# Patient Record
Sex: Female | Born: 1960 | Race: Black or African American | Hispanic: No | Marital: Married | Smoking: Former smoker
Health system: Southern US, Community
[De-identification: ages and names within clinical notes are randomized; demographics above are authoritative.]

## PROBLEM LIST (undated history)

## (undated) DIAGNOSIS — I10 Essential (primary) hypertension: Secondary | ICD-10-CM

## (undated) DIAGNOSIS — T7840XA Allergy, unspecified, initial encounter: Secondary | ICD-10-CM

## (undated) DIAGNOSIS — C50919 Malignant neoplasm of unspecified site of unspecified female breast: Secondary | ICD-10-CM

## (undated) DIAGNOSIS — G4730 Sleep apnea, unspecified: Secondary | ICD-10-CM

## (undated) DIAGNOSIS — M810 Age-related osteoporosis without current pathological fracture: Secondary | ICD-10-CM

## (undated) DIAGNOSIS — E785 Hyperlipidemia, unspecified: Secondary | ICD-10-CM

## (undated) DIAGNOSIS — M1990 Unspecified osteoarthritis, unspecified site: Secondary | ICD-10-CM

## (undated) DIAGNOSIS — E119 Type 2 diabetes mellitus without complications: Secondary | ICD-10-CM

## (undated) DIAGNOSIS — C50912 Malignant neoplasm of unspecified site of left female breast: Secondary | ICD-10-CM

## (undated) DIAGNOSIS — K219 Gastro-esophageal reflux disease without esophagitis: Secondary | ICD-10-CM

## (undated) HISTORY — DX: Hyperlipidemia, unspecified: E78.5

## (undated) HISTORY — PX: REDUCTION MAMMAPLASTY: SUR839

## (undated) HISTORY — PX: BREAST REDUCTION SURGERY: SHX8

## (undated) HISTORY — PX: BREAST EXCISIONAL BIOPSY: SUR124

## (undated) HISTORY — DX: Type 2 diabetes mellitus without complications: E11.9

## (undated) HISTORY — DX: Malignant neoplasm of unspecified site of unspecified female breast: C50.919

## (undated) HISTORY — DX: Essential (primary) hypertension: I10

## (undated) HISTORY — DX: Sleep apnea, unspecified: G47.30

## (undated) HISTORY — DX: Allergy, unspecified, initial encounter: T78.40XA

## (undated) HISTORY — DX: Gastro-esophageal reflux disease without esophagitis: K21.9

## (undated) HISTORY — DX: Malignant neoplasm of unspecified site of left female breast: C50.912

## (undated) HISTORY — PX: BREAST CYST ASPIRATION: SHX578

## (undated) HISTORY — DX: Age-related osteoporosis without current pathological fracture: M81.0

## (undated) HISTORY — PX: OTHER SURGICAL HISTORY: SHX169

## (undated) HISTORY — PX: ABDOMINAL HYSTERECTOMY: SHX81

## (undated) HISTORY — PX: BREAST BIOPSY: SHX20

---

## 2001-09-07 HISTORY — PX: CHOLECYSTECTOMY: SHX55

## 2004-05-06 ENCOUNTER — Ambulatory Visit: Payer: Self-pay | Admitting: Family Medicine

## 2005-05-10 ENCOUNTER — Emergency Department: Payer: Self-pay | Admitting: Emergency Medicine

## 2005-12-31 ENCOUNTER — Ambulatory Visit: Payer: Self-pay | Admitting: Family Medicine

## 2006-03-07 ENCOUNTER — Emergency Department: Payer: Self-pay | Admitting: Emergency Medicine

## 2006-03-25 ENCOUNTER — Ambulatory Visit: Payer: Self-pay

## 2007-01-11 ENCOUNTER — Ambulatory Visit: Payer: Self-pay

## 2007-02-16 ENCOUNTER — Other Ambulatory Visit: Payer: Self-pay

## 2007-02-16 ENCOUNTER — Emergency Department: Payer: Self-pay | Admitting: Emergency Medicine

## 2007-06-11 LAB — HM COLONOSCOPY

## 2007-06-23 ENCOUNTER — Emergency Department: Payer: Self-pay | Admitting: Unknown Physician Specialty

## 2007-09-15 ENCOUNTER — Ambulatory Visit: Payer: Self-pay | Admitting: Family Medicine

## 2008-07-13 ENCOUNTER — Emergency Department: Payer: Self-pay | Admitting: Emergency Medicine

## 2008-07-15 ENCOUNTER — Emergency Department: Payer: Self-pay

## 2010-03-20 ENCOUNTER — Ambulatory Visit: Payer: Self-pay | Admitting: Family Medicine

## 2010-07-17 ENCOUNTER — Ambulatory Visit: Payer: Self-pay

## 2011-04-06 ENCOUNTER — Emergency Department: Payer: Self-pay | Admitting: Emergency Medicine

## 2012-02-04 ENCOUNTER — Ambulatory Visit: Payer: Self-pay | Admitting: Family Medicine

## 2012-04-23 ENCOUNTER — Emergency Department: Payer: Self-pay | Admitting: Emergency Medicine

## 2012-04-23 LAB — COMPREHENSIVE METABOLIC PANEL
Albumin: 3.5 g/dL (ref 3.4–5.0)
Alkaline Phosphatase: 102 U/L (ref 50–136)
Anion Gap: 7 (ref 7–16)
BUN: 12 mg/dL (ref 7–18)
Bilirubin,Total: 0.3 mg/dL (ref 0.2–1.0)
Calcium, Total: 9.6 mg/dL (ref 8.5–10.1)
Chloride: 100 mmol/L (ref 98–107)
Co2: 28 mmol/L (ref 21–32)
Creatinine: 0.67 mg/dL (ref 0.60–1.30)
EGFR (African American): >60
EGFR (Non-African Amer.): >60
Glucose: 280 mg/dL — ABNORMAL HIGH (ref 65–99)
Osmolality: 280 (ref 275–301)
Potassium: 3.9 mmol/L (ref 3.5–5.1)
SGOT(AST): 22 U/L (ref 15–37)
SGPT (ALT): 65 U/L (ref 12–78)
Sodium: 135 mmol/L — ABNORMAL LOW (ref 136–145)
Total Protein: 7.7 g/dL (ref 6.4–8.2)

## 2012-04-23 LAB — CBC
HCT: 36.4 % (ref 35.0–47.0)
HGB: 12.4 g/dL (ref 12.0–16.0)
MCH: 29.7 pg (ref 26.0–34.0)
MCHC: 34.1 g/dL (ref 32.0–36.0)
MCV: 87 fL (ref 80–100)
Platelet: 232 10*3/uL (ref 150–440)
RBC: 4.19 10*6/uL (ref 3.80–5.20)
RDW: 14.2 % (ref 11.5–14.5)
WBC: 8.1 10*3/uL (ref 3.6–11.0)

## 2012-04-23 LAB — URINALYSIS, COMPLETE
Bilirubin,UR: NEGATIVE
Glucose,UR: >=500 mg/dL (ref 0–75)
Ketone: NEGATIVE
Leukocyte Esterase: NEGATIVE
Nitrite: NEGATIVE
Ph: 5 (ref 4.5–8.0)
Protein: NEGATIVE
RBC,UR: 1 /HPF (ref 0–5)
Specific Gravity: 1.013 (ref 1.003–1.030)
Squamous Epithelial: 1
WBC UR: 1 /HPF (ref 0–5)

## 2012-04-23 LAB — TROPONIN I: Troponin-I: <0.02 ng/mL

## 2012-04-23 LAB — CK TOTAL AND CKMB (NOT AT ARMC)
CK, Total: 114 U/L (ref 21–215)
CK-MB: 0.7 ng/mL (ref 0.5–3.6)

## 2012-05-21 ENCOUNTER — Ambulatory Visit: Payer: Self-pay | Admitting: Family Medicine

## 2012-06-15 ENCOUNTER — Ambulatory Visit: Payer: Self-pay | Admitting: Family Medicine

## 2012-07-10 ENCOUNTER — Ambulatory Visit: Payer: Self-pay | Admitting: Family Medicine

## 2012-08-07 ENCOUNTER — Ambulatory Visit: Payer: Self-pay | Admitting: Family Medicine

## 2012-10-26 ENCOUNTER — Ambulatory Visit: Payer: Self-pay | Admitting: Emergency Medicine

## 2012-10-28 LAB — PATHOLOGY REPORT

## 2013-01-10 ENCOUNTER — Emergency Department: Payer: Self-pay | Admitting: Emergency Medicine

## 2013-01-10 LAB — CBC
HCT: 36.9 % (ref 35.0–47.0)
HGB: 12.5 g/dL (ref 12.0–16.0)
MCH: 28.7 pg (ref 26.0–34.0)
MCHC: 33.8 g/dL (ref 32.0–36.0)
MCV: 85 fL (ref 80–100)
Platelet: 281 10*3/uL (ref 150–440)
RBC: 4.34 10*6/uL (ref 3.80–5.20)
RDW: 14.2 % (ref 11.5–14.5)
WBC: 8.8 10*3/uL (ref 3.6–11.0)

## 2013-01-10 LAB — COMPREHENSIVE METABOLIC PANEL
Albumin: 3.7 g/dL (ref 3.4–5.0)
Alkaline Phosphatase: 85 U/L (ref 50–136)
Anion Gap: 4 — ABNORMAL LOW (ref 7–16)
BUN: 16 mg/dL (ref 7–18)
Bilirubin,Total: 0.3 mg/dL (ref 0.2–1.0)
Calcium, Total: 9.3 mg/dL (ref 8.5–10.1)
Chloride: 103 mmol/L (ref 98–107)
Co2: 31 mmol/L (ref 21–32)
Creatinine: 0.62 mg/dL (ref 0.60–1.30)
EGFR (African American): >60
EGFR (Non-African Amer.): >60
Glucose: 114 mg/dL — ABNORMAL HIGH (ref 65–99)
Osmolality: 278 (ref 275–301)
Potassium: 3.5 mmol/L (ref 3.5–5.1)
SGOT(AST): 19 U/L (ref 15–37)
SGPT (ALT): 48 U/L (ref 12–78)
Sodium: 138 mmol/L (ref 136–145)
Total Protein: 7.6 g/dL (ref 6.4–8.2)

## 2013-01-10 LAB — URINALYSIS, COMPLETE
Bacteria: NONE SEEN
Bilirubin,UR: NEGATIVE
Blood: NEGATIVE
Glucose,UR: NEGATIVE mg/dL (ref 0–75)
Ketone: NEGATIVE
Leukocyte Esterase: NEGATIVE
Nitrite: NEGATIVE
Ph: 7 (ref 4.5–8.0)
Protein: NEGATIVE
RBC,UR: NONE SEEN /HPF (ref 0–5)
Specific Gravity: 1.008 (ref 1.003–1.030)
Squamous Epithelial: <1
WBC UR: NONE SEEN /HPF (ref 0–5)

## 2013-01-10 LAB — TROPONIN I: Troponin-I: <0.02 ng/mL

## 2013-03-17 ENCOUNTER — Ambulatory Visit: Payer: Self-pay | Admitting: Family Medicine

## 2013-03-30 ENCOUNTER — Emergency Department: Payer: Self-pay | Admitting: Emergency Medicine

## 2013-03-30 LAB — CBC
HCT: 39.7 % (ref 35.0–47.0)
HGB: 13.4 g/dL (ref 12.0–16.0)
MCH: 28.8 pg (ref 26.0–34.0)
MCHC: 33.7 g/dL (ref 32.0–36.0)
MCV: 85 fL (ref 80–100)
Platelet: 336 10*3/uL (ref 150–440)
RBC: 4.64 10*6/uL (ref 3.80–5.20)
RDW: 14.3 % (ref 11.5–14.5)
WBC: 10.2 10*3/uL (ref 3.6–11.0)

## 2013-03-30 LAB — URINALYSIS, COMPLETE
Bacteria: NONE SEEN
Bilirubin,UR: NEGATIVE
Blood: NEGATIVE
Glucose,UR: >=500 mg/dL (ref 0–75)
Ketone: NEGATIVE
Leukocyte Esterase: NEGATIVE
Nitrite: NEGATIVE
Ph: 8 (ref 4.5–8.0)
Protein: 100
RBC,UR: <1 /HPF (ref 0–5)
Specific Gravity: 1.008 (ref 1.003–1.030)
Squamous Epithelial: 1
WBC UR: NONE SEEN /HPF (ref 0–5)

## 2013-03-30 LAB — COMPREHENSIVE METABOLIC PANEL
Albumin: 3.8 g/dL (ref 3.4–5.0)
Alkaline Phosphatase: 105 U/L (ref 50–136)
Anion Gap: 8 (ref 7–16)
BUN: 13 mg/dL (ref 7–18)
Bilirubin,Total: 0.3 mg/dL (ref 0.2–1.0)
Calcium, Total: 10.1 mg/dL (ref 8.5–10.1)
Chloride: 101 mmol/L (ref 98–107)
Co2: 26 mmol/L (ref 21–32)
Creatinine: 0.88 mg/dL (ref 0.60–1.30)
EGFR (African American): >60
EGFR (Non-African Amer.): >60
Glucose: 240 mg/dL — ABNORMAL HIGH (ref 65–99)
Osmolality: 278 (ref 275–301)
Potassium: 3.9 mmol/L (ref 3.5–5.1)
SGOT(AST): 16 U/L (ref 15–37)
SGPT (ALT): 58 U/L (ref 12–78)
Sodium: 135 mmol/L — ABNORMAL LOW (ref 136–145)
Total Protein: 8.2 g/dL (ref 6.4–8.2)

## 2013-04-08 ENCOUNTER — Ambulatory Visit: Payer: Self-pay | Admitting: Family Medicine

## 2013-04-18 ENCOUNTER — Ambulatory Visit: Payer: Self-pay | Admitting: Family Medicine

## 2013-04-19 LAB — PATHOLOGY REPORT

## 2013-05-02 ENCOUNTER — Ambulatory Visit: Payer: Self-pay | Admitting: Family Medicine

## 2013-05-10 ENCOUNTER — Ambulatory Visit: Payer: Self-pay | Admitting: Hematology and Oncology

## 2013-05-11 ENCOUNTER — Ambulatory Visit: Payer: Self-pay | Admitting: Hematology and Oncology

## 2013-05-11 LAB — PATHOLOGY REPORT

## 2013-05-19 ENCOUNTER — Ambulatory Visit: Payer: Self-pay | Admitting: Orthopedic Surgery

## 2013-06-09 ENCOUNTER — Ambulatory Visit: Payer: Self-pay | Admitting: Hematology and Oncology

## 2013-06-13 ENCOUNTER — Ambulatory Visit: Payer: Self-pay | Admitting: Surgery

## 2013-06-13 LAB — BASIC METABOLIC PANEL
Anion Gap: 5 — ABNORMAL LOW (ref 7–16)
BUN: 17 mg/dL (ref 7–18)
Calcium, Total: 9.4 mg/dL (ref 8.5–10.1)
Chloride: 100 mmol/L (ref 98–107)
Co2: 30 mmol/L (ref 21–32)
Creatinine: 0.56 mg/dL — ABNORMAL LOW (ref 0.60–1.30)
EGFR (African American): >60
EGFR (Non-African Amer.): >60
Glucose: 152 mg/dL — ABNORMAL HIGH (ref 65–99)
Osmolality: 275 (ref 275–301)
Potassium: 3.7 mmol/L (ref 3.5–5.1)
Sodium: 135 mmol/L — ABNORMAL LOW (ref 136–145)

## 2013-06-17 ENCOUNTER — Ambulatory Visit: Payer: Self-pay | Admitting: Surgery

## 2013-06-17 HISTORY — PX: BREAST LUMPECTOMY: SHX2

## 2013-06-21 LAB — PATHOLOGY REPORT

## 2013-07-10 ENCOUNTER — Ambulatory Visit: Payer: Self-pay | Admitting: Hematology and Oncology

## 2013-08-01 LAB — CBC CANCER CENTER
Basophil #: 0.1 x10 3/mm (ref 0.0–0.1)
Basophil %: 0.9 %
Eosinophil #: 0 x10 3/mm (ref 0.0–0.7)
Eosinophil %: 0.5 %
HCT: 36.5 % (ref 35.0–47.0)
HGB: 11.8 g/dL — ABNORMAL LOW (ref 12.0–16.0)
Lymphocyte #: 2.5 x10 3/mm (ref 1.0–3.6)
Lymphocyte %: 28.6 %
MCH: 27.8 pg (ref 26.0–34.0)
MCHC: 32.4 g/dL (ref 32.0–36.0)
MCV: 86 fL (ref 80–100)
Monocyte #: 0.6 x10 3/mm (ref 0.2–0.9)
Monocyte %: 6.6 %
Neutrophil #: 5.6 x10 3/mm (ref 1.4–6.5)
Neutrophil %: 63.4 %
Platelet: 334 x10 3/mm (ref 150–440)
RBC: 4.25 10*6/uL (ref 3.80–5.20)
RDW: 13.9 % (ref 11.5–14.5)
WBC: 8.8 x10 3/mm (ref 3.6–11.0)

## 2013-08-07 ENCOUNTER — Ambulatory Visit: Payer: Self-pay | Admitting: Hematology and Oncology

## 2013-08-08 LAB — CBC CANCER CENTER
Basophil #: 0.1 x10 3/mm (ref 0.0–0.1)
Basophil %: 1.1 %
Eosinophil #: 0.1 x10 3/mm (ref 0.0–0.7)
Eosinophil %: 0.9 %
HCT: 37.7 % (ref 35.0–47.0)
HGB: 11.9 g/dL — ABNORMAL LOW (ref 12.0–16.0)
Lymphocyte #: 2 x10 3/mm (ref 1.0–3.6)
Lymphocyte %: 24.1 %
MCH: 27.5 pg (ref 26.0–34.0)
MCHC: 31.7 g/dL — ABNORMAL LOW (ref 32.0–36.0)
MCV: 87 fL (ref 80–100)
Monocyte #: 0.5 x10 3/mm (ref 0.2–0.9)
Monocyte %: 6.4 %
Neutrophil #: 5.7 x10 3/mm (ref 1.4–6.5)
Neutrophil %: 67.5 %
Platelet: 323 x10 3/mm (ref 150–440)
RBC: 4.34 10*6/uL (ref 3.80–5.20)
RDW: 14 % (ref 11.5–14.5)
WBC: 8.4 x10 3/mm (ref 3.6–11.0)

## 2013-08-11 LAB — COMPREHENSIVE METABOLIC PANEL
Albumin: 3.6 g/dL (ref 3.4–5.0)
Alkaline Phosphatase: 81 U/L
Anion Gap: 8 (ref 7–16)
BUN: 14 mg/dL (ref 7–18)
Bilirubin,Total: 0.3 mg/dL (ref 0.2–1.0)
Calcium, Total: 8.9 mg/dL (ref 8.5–10.1)
Chloride: 100 mmol/L (ref 98–107)
Co2: 30 mmol/L (ref 21–32)
Creatinine: 0.72 mg/dL (ref 0.60–1.30)
EGFR (African American): >60
EGFR (Non-African Amer.): >60
Glucose: 145 mg/dL — ABNORMAL HIGH (ref 65–99)
Osmolality: 279 (ref 275–301)
Potassium: 3.7 mmol/L (ref 3.5–5.1)
SGOT(AST): 9 U/L — ABNORMAL LOW (ref 15–37)
SGPT (ALT): 35 U/L (ref 12–78)
Sodium: 138 mmol/L (ref 136–145)
Total Protein: 7.7 g/dL (ref 6.4–8.2)

## 2013-08-11 LAB — CBC CANCER CENTER
Basophil #: 0.1 x10 3/mm (ref 0.0–0.1)
Basophil %: 0.7 %
Eosinophil #: 0 x10 3/mm (ref 0.0–0.7)
Eosinophil %: 0.6 %
HCT: 38.1 % (ref 35.0–47.0)
HGB: 12.3 g/dL (ref 12.0–16.0)
Lymphocyte #: 1.5 x10 3/mm (ref 1.0–3.6)
Lymphocyte %: 21.2 %
MCH: 27.8 pg (ref 26.0–34.0)
MCHC: 32.3 g/dL (ref 32.0–36.0)
MCV: 86 fL (ref 80–100)
Monocyte #: 0.4 x10 3/mm (ref 0.2–0.9)
Monocyte %: 5.8 %
Neutrophil #: 5 x10 3/mm (ref 1.4–6.5)
Neutrophil %: 71.7 %
Platelet: 296 x10 3/mm (ref 150–440)
RBC: 4.42 10*6/uL (ref 3.80–5.20)
RDW: 14.1 % (ref 11.5–14.5)
WBC: 7 x10 3/mm (ref 3.6–11.0)

## 2013-08-15 LAB — CBC CANCER CENTER
Basophil #: 0.1 x10 3/mm (ref 0.0–0.1)
Basophil %: 1.1 %
Eosinophil #: 0.1 x10 3/mm (ref 0.0–0.7)
Eosinophil %: 0.8 %
HCT: 37.8 % (ref 35.0–47.0)
HGB: 12.2 g/dL (ref 12.0–16.0)
Lymphocyte #: 2.3 x10 3/mm (ref 1.0–3.6)
Lymphocyte %: 25.6 %
MCH: 27.7 pg (ref 26.0–34.0)
MCHC: 32.2 g/dL (ref 32.0–36.0)
MCV: 86 fL (ref 80–100)
Monocyte #: 0.7 x10 3/mm (ref 0.2–0.9)
Monocyte %: 7.3 %
Neutrophil #: 5.9 x10 3/mm (ref 1.4–6.5)
Neutrophil %: 65.2 %
Platelet: 305 x10 3/mm (ref 150–440)
RBC: 4.4 10*6/uL (ref 3.80–5.20)
RDW: 14.1 % (ref 11.5–14.5)
WBC: 9.1 x10 3/mm (ref 3.6–11.0)

## 2013-08-22 LAB — CBC CANCER CENTER
Basophil #: 0.1 x10 3/mm (ref 0.0–0.1)
Basophil %: 0.8 %
Eosinophil #: 0.1 x10 3/mm (ref 0.0–0.7)
Eosinophil %: 1.1 %
HCT: 37.5 % (ref 35.0–47.0)
HGB: 12.2 g/dL (ref 12.0–16.0)
Lymphocyte #: 1.8 x10 3/mm (ref 1.0–3.6)
Lymphocyte %: 24.4 %
MCH: 28.1 pg (ref 26.0–34.0)
MCHC: 32.5 g/dL (ref 32.0–36.0)
MCV: 87 fL (ref 80–100)
Monocyte #: 0.5 x10 3/mm (ref 0.2–0.9)
Monocyte %: 7.1 %
Neutrophil #: 5.1 x10 3/mm (ref 1.4–6.5)
Neutrophil %: 66.6 %
Platelet: 261 x10 3/mm (ref 150–440)
RBC: 4.32 10*6/uL (ref 3.80–5.20)
RDW: 14.1 % (ref 11.5–14.5)
WBC: 7.6 x10 3/mm (ref 3.6–11.0)

## 2013-08-29 LAB — CBC CANCER CENTER
Basophil #: 0 x10 3/mm (ref 0.0–0.1)
Basophil %: 0.6 %
Eosinophil #: 0.1 x10 3/mm (ref 0.0–0.7)
Eosinophil %: 0.9 %
HCT: 35.1 % (ref 35.0–47.0)
HGB: 11.2 g/dL — ABNORMAL LOW (ref 12.0–16.0)
Lymphocyte #: 1.5 x10 3/mm (ref 1.0–3.6)
Lymphocyte %: 20.7 %
MCH: 27.8 pg (ref 26.0–34.0)
MCHC: 32 g/dL (ref 32.0–36.0)
MCV: 87 fL (ref 80–100)
Monocyte #: 0.4 x10 3/mm (ref 0.2–0.9)
Monocyte %: 6.4 %
Neutrophil #: 5 x10 3/mm (ref 1.4–6.5)
Neutrophil %: 71.4 %
Platelet: 274 x10 3/mm (ref 150–440)
RBC: 4.04 10*6/uL (ref 3.80–5.20)
RDW: 14.4 % (ref 11.5–14.5)
WBC: 7 x10 3/mm (ref 3.6–11.0)

## 2013-09-05 LAB — CBC CANCER CENTER
Basophil #: 0.1 x10 3/mm (ref 0.0–0.1)
Basophil %: 0.7 %
Eosinophil #: 0.1 x10 3/mm (ref 0.0–0.7)
Eosinophil %: 1.1 %
HCT: 37.5 % (ref 35.0–47.0)
HGB: 12 g/dL (ref 12.0–16.0)
Lymphocyte #: 1.5 x10 3/mm (ref 1.0–3.6)
Lymphocyte %: 19.9 %
MCH: 27.7 pg (ref 26.0–34.0)
MCHC: 32 g/dL (ref 32.0–36.0)
MCV: 87 fL (ref 80–100)
Monocyte #: 0.5 x10 3/mm (ref 0.2–0.9)
Monocyte %: 6.8 %
Neutrophil #: 5.5 x10 3/mm (ref 1.4–6.5)
Neutrophil %: 71.5 %
Platelet: 277 x10 3/mm (ref 150–440)
RBC: 4.33 10*6/uL (ref 3.80–5.20)
RDW: 14.2 % (ref 11.5–14.5)
WBC: 7.7 x10 3/mm (ref 3.6–11.0)

## 2013-09-07 ENCOUNTER — Ambulatory Visit: Payer: Self-pay | Admitting: Hematology and Oncology

## 2013-09-27 LAB — COMPREHENSIVE METABOLIC PANEL WITH GFR
Albumin: 3.4 g/dL
Alkaline Phosphatase: 72 U/L
Anion Gap: 9
BUN: 18 mg/dL
Bilirubin,Total: 0.3 mg/dL
Calcium, Total: 9.6 mg/dL
Chloride: 104 mmol/L
Co2: 30 mmol/L
Creatinine: 0.77 mg/dL
EGFR (African American): >60
EGFR (Non-African Amer.): >60
Glucose: 131 mg/dL — ABNORMAL HIGH
Osmolality: 289
Potassium: 3.6 mmol/L
SGOT(AST): 6 U/L — ABNORMAL LOW
SGPT (ALT): 42 U/L
Sodium: 143 mmol/L
Total Protein: 7.4 g/dL

## 2013-09-27 LAB — CBC CANCER CENTER
Basophil #: 0.1 10*3/uL
Basophil %: 0.9 %
Eosinophil #: 0 10*3/uL
Eosinophil %: 0.9 %
HCT: 35 %
HGB: 11.4 g/dL — ABNORMAL LOW
Lymphocyte %: 21.5 %
Lymphs Abs: 1.2 10*3/uL
MCH: 28 pg
MCHC: 32.5 g/dL
MCV: 86 fL
Monocyte #: 0.4 10*3/uL
Monocyte %: 7.7 %
Neutrophil #: 3.9 10*3/uL
Neutrophil %: 69 %
Platelet: 277 10*3/uL
RBC: 4.07 10*6/uL
RDW: 15.1 % — ABNORMAL HIGH
WBC: 5.6 10*3/uL

## 2013-10-07 ENCOUNTER — Ambulatory Visit: Payer: Self-pay | Admitting: Hematology and Oncology

## 2013-11-02 LAB — COMPREHENSIVE METABOLIC PANEL
Albumin: 3.4 g/dL (ref 3.4–5.0)
Alkaline Phosphatase: 68 U/L
Anion Gap: 7 (ref 7–16)
BUN: 15 mg/dL (ref 7–18)
Bilirubin,Total: 0.4 mg/dL (ref 0.2–1.0)
Calcium, Total: 9.5 mg/dL (ref 8.5–10.1)
Chloride: 102 mmol/L (ref 98–107)
Co2: 28 mmol/L (ref 21–32)
Creatinine: 0.71 mg/dL (ref 0.60–1.30)
EGFR (African American): >60
EGFR (Non-African Amer.): >60
Glucose: 169 mg/dL — ABNORMAL HIGH (ref 65–99)
Osmolality: 279 (ref 275–301)
Potassium: 3.9 mmol/L (ref 3.5–5.1)
SGOT(AST): 15 U/L (ref 15–37)
SGPT (ALT): 50 U/L (ref 12–78)
Sodium: 137 mmol/L (ref 136–145)
Total Protein: 7.4 g/dL (ref 6.4–8.2)

## 2013-11-02 LAB — CBC CANCER CENTER
Basophil #: 0.1 x10 3/mm (ref 0.0–0.1)
Basophil %: 1 %
Eosinophil #: 0 x10 3/mm (ref 0.0–0.7)
Eosinophil %: 0.5 %
HCT: 37.3 % (ref 35.0–47.0)
HGB: 12.4 g/dL (ref 12.0–16.0)
Lymphocyte #: 1.2 x10 3/mm (ref 1.0–3.6)
Lymphocyte %: 21.4 %
MCH: 28.9 pg (ref 26.0–34.0)
MCHC: 33.2 g/dL (ref 32.0–36.0)
MCV: 87 fL (ref 80–100)
Monocyte #: 0.5 x10 3/mm (ref 0.2–0.9)
Monocyte %: 8.1 %
Neutrophil #: 4 x10 3/mm (ref 1.4–6.5)
Neutrophil %: 69 %
Platelet: 251 x10 3/mm (ref 150–440)
RBC: 4.28 10*6/uL (ref 3.80–5.20)
RDW: 14.7 % — ABNORMAL HIGH (ref 11.5–14.5)
WBC: 5.8 x10 3/mm (ref 3.6–11.0)

## 2013-11-07 ENCOUNTER — Ambulatory Visit: Payer: Self-pay | Admitting: Hematology and Oncology

## 2013-11-14 DIAGNOSIS — M1711 Unilateral primary osteoarthritis, right knee: Secondary | ICD-10-CM | POA: Insufficient documentation

## 2014-03-09 ENCOUNTER — Ambulatory Visit: Payer: Self-pay | Admitting: Internal Medicine

## 2014-04-13 ENCOUNTER — Ambulatory Visit: Payer: Self-pay | Admitting: Hematology and Oncology

## 2014-04-14 LAB — CBC CANCER CENTER
Basophil #: 0.1 x10 3/mm (ref 0.0–0.1)
Basophil %: 1 %
Eosinophil #: 0.1 x10 3/mm (ref 0.0–0.7)
Eosinophil %: 0.5 %
HCT: 38.7 % (ref 35.0–47.0)
HGB: 12.5 g/dL (ref 12.0–16.0)
Lymphocyte #: 2.6 x10 3/mm (ref 1.0–3.6)
Lymphocyte %: 20.6 %
MCH: 28.9 pg (ref 26.0–34.0)
MCHC: 32.3 g/dL (ref 32.0–36.0)
MCV: 90 fL (ref 80–100)
Monocyte #: 0.7 x10 3/mm (ref 0.2–0.9)
Monocyte %: 5.7 %
Neutrophil #: 9.1 x10 3/mm — ABNORMAL HIGH (ref 1.4–6.5)
Neutrophil %: 72.2 %
RBC: 4.31 10*6/uL (ref 3.80–5.20)
RDW: 13.9 % (ref 11.5–14.5)
WBC: 12.6 x10 3/mm — ABNORMAL HIGH (ref 3.6–11.0)

## 2014-04-14 LAB — BASIC METABOLIC PANEL
Anion Gap: 9 (ref 7–16)
BUN: 17 mg/dL (ref 7–18)
Calcium, Total: 9.1 mg/dL (ref 8.5–10.1)
Chloride: 103 mmol/L (ref 98–107)
Co2: 29 mmol/L (ref 21–32)
Creatinine: 0.75 mg/dL (ref 0.60–1.30)
EGFR (African American): >60
EGFR (Non-African Amer.): >60
Glucose: 124 mg/dL — ABNORMAL HIGH (ref 65–99)
Osmolality: 284 (ref 275–301)
Potassium: 4 mmol/L (ref 3.5–5.1)
Sodium: 141 mmol/L (ref 136–145)

## 2014-05-09 ENCOUNTER — Ambulatory Visit: Payer: Self-pay | Admitting: Hematology and Oncology

## 2014-07-06 ENCOUNTER — Ambulatory Visit: Payer: Self-pay | Admitting: Hematology and Oncology

## 2014-07-06 DIAGNOSIS — Z853 Personal history of malignant neoplasm of breast: Secondary | ICD-10-CM | POA: Diagnosis not present

## 2014-08-02 LAB — HM PAP SMEAR: HM Pap smear: NEGATIVE

## 2014-08-03 LAB — HEPATIC FUNCTION PANEL
ALT: 20 U/L (ref 7–35)
AST: 12 U/L — AB (ref 13–35)

## 2014-08-03 LAB — LIPID PANEL
Cholesterol: 146 mg/dL (ref 0–200)
HDL: 25 mg/dL — AB (ref 35–70)
LDL Cholesterol: 83 mg/dL
Triglycerides: 189 mg/dL — AB (ref 40–160)

## 2014-08-03 LAB — BASIC METABOLIC PANEL
BUN: 15 mg/dL (ref 4–21)
Creatinine: 0.6 mg/dL (ref 0.5–1.1)
Glucose: 147 mg/dL
Potassium: 4.2 mmol/L (ref 3.4–5.3)
Sodium: 145 mmol/L (ref 137–147)

## 2014-08-03 LAB — CBC AND DIFFERENTIAL
HCT: 36 % (ref 36–46)
Hemoglobin: 12 g/dL (ref 12.0–16.0)
Platelets: 269 10*3/uL (ref 150–399)
WBC: 5.1 10^3/mL

## 2014-09-20 LAB — CREATININE, SERUM: Creatine, Serum: 0.75

## 2014-09-30 NOTE — Op Note (Signed)
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   PROCEDURE:  Left partial mastectomy.   SURGEON:  Loreli Dollar, M.D.   ANESTHESIA:  General.   INDICATIONS:  This 54 year old female has a history of mammogram findings of all of multiple micro-calcifications within the left breast. She had needle biopsies in the lower outer quadrant. One biopsy demonstrated lobular carcinoma in situ, and another biopsy demonstrated ductal carcinoma in situ. Radiographic localization was recommended for these 2 lesions with partial mastectomy for further evaluation and treatment. The patient has also had Oncology consultation.   She did have preoperative insertion of Kopans wires in the lower outer quadrant. I reviewed these mammogram images demonstrating the location of the wires and the depth.   The patient was placed on the operating table in the supine position under general anesthesia. The dressing was removed from the left breast exposing the Kopans wire in the lower outer quadrant. The wires were cut 2 cm from the skin. The breast was prepared with ChloraPrep and draped in a sterile manner. It is noted that the site of entrance of the wires was just above her old scar where she had had reduction mammoplasty. An obliquely-oriented curvilinear incision was made from approximately 3:30 to 5:30 position in the peripheral aspect of the left breast. This elliptical incision encompassed the entrance point of the wires. Dissection was carried down into the breast circumferentially around the wires and completely excised the tissue with the wires. I did not identify any actual tumor, although I did identify some glandular tissue of the breast and what may be  some old scarring. The specimen was labeled so that the 5:30 position of the skin ellipse was tagged with a stitch and also margin maps were attached to mark the medial, lateral, cranial, caudal and deep margins. This was submitted for specimen mammogram and pathology.   The wound was inspected. It is noted that there was a point in the inferior aspect of the wound which bled and was cauterized and then was suture ligated with 4-0 chromic. Multiple other small bleeding points were cauterized during the course of the procedure. Deeper tissues surrounding cautery artifact were infiltrated with 0.5% Sensorcaine with epinephrine. The subcutaneous tissues were infiltrated as well using a total of 19 mL. The subcutaneous tissues were closed with 4-0 chromic, and the skin was closed with running 4-0 Monocryl subcuticular suture.   I waited and did see her mammogram depicting the location of the biopsy markers and micro-calcifications and wires. Later the pathologist called back to indicate that this had been evaluated and it appeared that the inferior margin was somewhat close and there was concern about the inferior margin and possibility of tumor in this area.   Subsequently, the wound was reopened and I re-excised the inferior margin by grasping this tissue with 2 Allis clamps, with the Allis clamps were approximately 3 cm apart, and beginning anteriorly and continuing in a posterior direction, a portion of tissue approximately 1 cm thick and some 4 x 4 cm was excised as the new posterior margin. This was placed onto a Telfa. I used a suture to attach it to the Telfa and also reported on the Pathology request that the  new margin was adjacent to the Telfa and also noted that the suture ligature was within the old margin and this was submitted for pathology.   It is noted that during the course of this re-excision, one bleeding point was suture ligated with 4-0 chromic. It is further noted the remainder of the  wound was further inspected and hemostasis appeared to be intact. Next, the wound was closed with interrupted 4-0 chromic subcutaneous sutures and then the skin was closed with running 4-0 Monocryl subcuticular suture and Dermabond.   The patient appeared to tolerate the procedure well and was then prepared for transfer to the Recovery Room.   It required somewhat more effort due to the need for re-opening the wound and a re-excising the inferior margin and also included 2 markers.   ____________________________ Lenna Sciara. Rochel Brome, MD [email or web address] D: 06/17/2013 14:46:56 ET T: 06/17/2013 15:43:22 ET JOB#: [id number]  cc: Loreli Dollar, MD, <Dictator> Loreli Dollar MD ELECTRONICALLY SIGNED 06/22/2013 18:08

## 2014-09-30 NOTE — Consult Note (Signed)
Reason for Visit: This 54 year old Female patient presents to the clinic for initial evaluation of  breast cancer .   Referred by Dr. Tamala Julian.  Diagnosis:  Chief Complaint/Diagnosis   54 year old female status post wide local excision of the left breast for a stage 0 (Tis N0 M0) ductal carcinoma in situ with lobular carcinoma in situ also present on reexcision margins clear tumor ER/PR positive patient is status post bilateral breast reduction  Pathology Report pathology report reviewed   Imaging Report mammograms reviewed   Referral Report clinical notes reviewed   Planned Treatment Regimen whole breast radiation plus tamoxifen   HPI   patient is a 54 year old female who presented in October of 2014 with suspicious fine linear oriented microcalcifications in the upper outer quadrant of the left breast spanning approximate 7 cm. Stereotactic biopsy was recommended and was performed November 10 showing lobular carcinoma in situ.she wants have a second biopsy on June 17, 2013 showing low grade ductal carcinoma in situ again ER/PR positive. Reexcision showed negative margins. Patient has done well postoperatively. 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 She seen today for initial consultation. She is doing well she specifically denies breast tenderness cough or bone pain.  Past Hx:    Breast Cancer DCIS:    Asthma:    GERD - Esophageal Reflux:    Hypercholesterolemia:    Arthritis:    Diabetes Mellitus, Type II (NIDD):    HTN:    COPD:    Diabetes:    gerd:    htn:    breast reduction:    c section x 2:    cholecystectomy:    hysterectomy:   Past, Family and Social History:  Past Medical History positive   Cardiovascular hyperlipidemia; hypertension   Respiratory asthma; COPD   Gastrointestinal GERD   Endocrine diabetes mellitus   Past Surgical  History cholecystectomy; bilateral breast reduction, C-section, hysterectomy   Past Medical History Comments arthritis   Family History positive   Family History Comments mother with both breast and rectal cancer uncle with gastric cancer   Social History noncontributory   Additional Past Medical and Surgical History accompanied by her husband today   Allergies:   Allegra: GI Distress  Bactrim: Unknown  Sulfa drugs: Unknown  Avelox: Hallucinations  Penicillin: GI Distress  Home Meds:  Home Medications: Medication Instructions Status  cetirizine 10 mg oral tablet 1 tab(s) orally once a day Active  amlodipine 5 mg oral tablet 1 tab(s) orally once a day Active  Vitamin D3 5000 intl units oral capsule 1 cap(s) orally once a day Active  metFORMIN 1000 mg oral tablet 1 tab(s) orally 2 times a day Active  pravastatin 40 mg oral tablet 1 tab(s) orally once a day Active  omeprazole 20 mg oral delayed release tablet 1 tab(s) orally once a day Active  ProAir HFA CFC free 90 mcg/inh inhalation aerosol 2 puff(s) inhaled 4 times a day, As Needed - for Shortness of Breath Active  meloxicam 15 mg oral tablet 1 tab(s) orally once a day Active  Januvia 100 mg oral tablet 1 tab(s) orally once a day Active  hydrochlorothiazide-lisinopril 12.5/25mg  2 tab(s) orally once a day Active  spironolactone 20 milligram(s) orally once a day Active  Vitamin C 1000 mg oral tablet 1 tab(s) orally once a day Active  Vitamin B-12 500 mcg oral tablet 1  tab(s) orally once a day Active  Hair, Skin & Nails 5 mg oral capsule 1 cap(s) orally once a day Active  Fish Oil 1200 mg oral capsule 1 cap(s) orally once a day Active  coconut oil 5 milligram(s) 2 tabs  once a day Active  montelukast 10 mg oral tablet 1 tab(s) orally once a day Active  Norco 325 mg-5 mg oral tablet  1-2 orally every 4 hours as needed for pain Active  levETIRAcetam 500 mg oral tablet 1 tab(s) orally 2 times a day Active   Review of Systems:   General negative   Performance Status (ECOG) 0   Skin negative   Breast see HPI   Ophthalmologic negative   ENMT negative   Respiratory and Thorax negative   Cardiovascular negative   Gastrointestinal negative   Genitourinary negative   Musculoskeletal negative   Neurological negative   Psychiatric negative   Hematology/Lymphatics negative   Endocrine negative   Allergic/Immunologic negative   Review of Systems   review of systems obtained from nurses notes  Nursing Notes:  Nursing Vital Signs and Chemo Nursing Nursing Notes: *CC Vital Signs Flowsheet:   26-Jan-15 10:29  Temp Temperature 97.6  Pulse Pulse 73  Respirations Respirations 21  SBP SBP 160  DBP DBP 78  Current Weight (kg) (kg) 104.3  Height (cm) centimeters 151  BSA (m2) 1.9   Physical Exam:  General/Skin/HEENT:  General normal   Skin normal   Eyes normal   ENMT normal   Head and Neck normal   Additional PE well-developed obese female in NAD. Lungs are clear to A&P cardiac examination shows regular rate and rhythm. She status post bilateral breast reduction. There's been reconstruction the nipple areolar complex. Status post wide local excision of the left breast with incision healing well. No dominant mass or nodularity is noted in either breast into position examined. No axillary or supraclavicular adenopathy is appreciated.   Breasts/Resp/CV/GI/GU:  Respiratory and Thorax normal   Cardiovascular normal   Gastrointestinal normal   Genitourinary normal   MS/Neuro/Psych/Lymph:  Musculoskeletal normal   Neurological normal   Lymphatics normal   Other Results:  Radiology Results: [place]:    31-Oct-14 13:20, Digital Additional Views Bilateral  Digital Additional Views Bilateral   REASON FOR EXAM:    av lt mass w calcs and rt calci  COMMENTS:       PROCEDURE: MAM - MAM DGTL  ADD VW  BIL SCR  - Apr 08 2013  1:20PM     CLINICAL DATA:  Calcifications in both breasts  and possible mass  left breast identified on recent screening mammogram. The patient  has a history of bilateral breast reduction.    EXAM:  DIGITAL DIAGNOSTIC  BILATERAL MAMMOGRAM    ULTRASOUND LEFT BREAST    COMPARISON:  03/17/2013  ACR Breast Density Category c: The breast tissue is heterogeneously  dense, which may obscure small masses.    FINDINGS:  Magnification views of the posterior 3rd of the central inferior  right breast demonstrate coarse calcifications along an area of  postsurgical scarring. These calcifications have similar appearances  to the patient's multiple scattered bilateral calcifications in both  breasts inner likely related to fat necrosis secondary to breast  reduction.    Magnification views of the upper outer left breast show multiple  fine, heterogeneous microcalcifications, many of which are linearly  arranged, suspicious for a ductal distribution. These calcifications  span at least 7 x 7 x 3.5 cm. There  is some patient motion artifact  in the mL projection. Ductal carcinoma in situ cannot be excluded.    Focal spot compression view of the outer left breast demonstrates a  persistent oval nodular density.    On physical exam no mass is palpated in the outer left breast.    Ultrasound is performed, showing a mildly complex cyst at 2:30  position left breast 4 cm from nipple measuring 6 x 5 x 4 mm. No  suspicious mass is seen on ultrasound.     IMPRESSION:  Suspicious fine, linearly oriented microcalcifications in the upper  outer left breast. These calcifications span approximately 7 cm  greatest diameter and are suspicious for ductal carcinoma in situ.  No evidence of malignancy in the right breast.    RECOMMENDATION:    Stereotactic biopsy of left breast calcifications is recommended and  has been scheduled for 04/18/2013 at 9 o'clock a.m..    I have discussed the findings and recommendations with the patient.  Results were also provided  in writing at the conclusion of the  visit. If applicable, a reminder letter will be sent to the patient  regarding the next appointment.    BI-RADS CATEGORY  4: Suspicious abnormality - biopsy should be  considered.  Electronically Signed    By: Curlene Dolphin M.D.    On: 04/08/2013 15:02         Verified By: Sheppard Evens, M.D.,   Relevent Results:   Relevant Scans and Labs mammograms reviewed   Assessment and Plan: Impression:   54 year old female withductal carcinoma in situ of the left breast status post wide local excision ER/PR positive to receive whole breast radiation plus tamoxifen. Plan:   at this time I have a long discussion with the patient and her husband about my recommendations a standard of care and ductal carcinoma in situ. Patient's reluctance has been discussed and she is willing to undergo whole breast radiation. Would plan on delivering 5000 cGy to her left breast. Based on her large breast size she's not a candidate for hyperfractionated regimens. Would also boost or scar another 1400 cGy using electron beam. Risks and benefits of treatment including skin reaction, fatigue, possible inclusion of superficial lung, and possible alteration the blood counts were discussed in detail with the patient. She seems to comprehend my treatment plan well. I have set her for CTsimulation early next week. Patient will also be a candidate for tamoxifen therapy after completion of radiation.  I would like to take this opportunity to thank you for allowing me to continue to participate in this patient's care.  CC Referral:  cc: Dr. Tamala Julian, Dr. Margarita Rana   Electronic Signatures: Baruch Gouty Roda Shutters (MD)  (Signed 26-Jan-15 11:27)  Authored: HPI, Diagnosis, Past Hx, PFSH, Allergies, Home Meds, ROS, Nursing Notes, Physical Exam, Other Results, Relevent Results, Encounter Assessment and Plan, CC Referring Physician   Last Updated: 26-Jan-15 11:27 by Armstead Peaks (MD)

## 2014-10-04 ENCOUNTER — Other Ambulatory Visit: Payer: Self-pay

## 2014-10-04 DIAGNOSIS — Z853 Personal history of malignant neoplasm of breast: Secondary | ICD-10-CM | POA: Insufficient documentation

## 2014-10-04 DIAGNOSIS — C50912 Malignant neoplasm of unspecified site of left female breast: Secondary | ICD-10-CM

## 2014-10-04 HISTORY — DX: Malignant neoplasm of unspecified site of left female breast: C50.912

## 2014-10-13 ENCOUNTER — Other Ambulatory Visit: Payer: Self-pay

## 2014-10-13 ENCOUNTER — Ambulatory Visit: Payer: Self-pay | Admitting: Hematology and Oncology

## 2014-10-18 ENCOUNTER — Other Ambulatory Visit: Payer: Self-pay | Admitting: Family Medicine

## 2014-10-18 ENCOUNTER — Ambulatory Visit
Admission: RE | Admit: 2014-10-18 | Discharge: 2014-10-18 | Disposition: A | Discharge status: Home / Self Care | Payer: Commercial Managed Care - HMO | Source: Ambulatory Visit | Attending: Family Medicine | Admitting: Family Medicine

## 2014-10-18 DIAGNOSIS — R059 Cough, unspecified: Secondary | ICD-10-CM

## 2014-10-18 DIAGNOSIS — R05 Cough: Secondary | ICD-10-CM

## 2014-11-03 LAB — HEMOGLOBIN A1C: Hgb A1c MFr Bld: 8 % — AB (ref 4.0–6.0)

## 2014-11-08 DIAGNOSIS — M19019 Primary osteoarthritis, unspecified shoulder: Secondary | ICD-10-CM | POA: Insufficient documentation

## 2014-11-18 DIAGNOSIS — K449 Diaphragmatic hernia without obstruction or gangrene: Secondary | ICD-10-CM

## 2014-11-18 DIAGNOSIS — J45909 Unspecified asthma, uncomplicated: Secondary | ICD-10-CM | POA: Insufficient documentation

## 2014-11-18 DIAGNOSIS — G4733 Obstructive sleep apnea (adult) (pediatric): Secondary | ICD-10-CM | POA: Insufficient documentation

## 2014-11-18 DIAGNOSIS — I10 Essential (primary) hypertension: Secondary | ICD-10-CM | POA: Insufficient documentation

## 2014-11-18 DIAGNOSIS — E669 Obesity, unspecified: Secondary | ICD-10-CM | POA: Insufficient documentation

## 2014-11-18 DIAGNOSIS — M1990 Unspecified osteoarthritis, unspecified site: Secondary | ICD-10-CM | POA: Insufficient documentation

## 2014-11-18 DIAGNOSIS — E1159 Type 2 diabetes mellitus with other circulatory complications: Secondary | ICD-10-CM | POA: Insufficient documentation

## 2014-11-18 DIAGNOSIS — E66812 Obesity, class 2: Secondary | ICD-10-CM | POA: Insufficient documentation

## 2014-11-18 DIAGNOSIS — E1140 Type 2 diabetes mellitus with diabetic neuropathy, unspecified: Secondary | ICD-10-CM | POA: Insufficient documentation

## 2014-11-18 DIAGNOSIS — E7800 Pure hypercholesterolemia, unspecified: Secondary | ICD-10-CM | POA: Insufficient documentation

## 2014-11-18 DIAGNOSIS — N6459 Other signs and symptoms in breast: Secondary | ICD-10-CM | POA: Insufficient documentation

## 2014-11-18 DIAGNOSIS — Z6841 Body Mass Index (BMI) 40.0 and over, adult: Secondary | ICD-10-CM | POA: Insufficient documentation

## 2014-11-18 DIAGNOSIS — I152 Hypertension secondary to endocrine disorders: Secondary | ICD-10-CM | POA: Insufficient documentation

## 2014-11-18 DIAGNOSIS — N6009 Solitary cyst of unspecified breast: Secondary | ICD-10-CM | POA: Insufficient documentation

## 2014-11-18 DIAGNOSIS — K219 Gastro-esophageal reflux disease without esophagitis: Secondary | ICD-10-CM | POA: Insufficient documentation

## 2014-11-18 DIAGNOSIS — C50919 Malignant neoplasm of unspecified site of unspecified female breast: Secondary | ICD-10-CM | POA: Insufficient documentation

## 2014-11-18 DIAGNOSIS — J302 Other seasonal allergic rhinitis: Secondary | ICD-10-CM | POA: Insufficient documentation

## 2014-11-18 HISTORY — DX: Diaphragmatic hernia without obstruction or gangrene: K44.9

## 2014-11-27 ENCOUNTER — Telehealth: Payer: Self-pay | Admitting: Physician Assistant

## 2014-11-27 MED ORDER — SITAGLIPTIN PHOSPHATE 100 MG PO TABS: 100.0000 mg | ORAL_TABLET | Freq: Every day | ORAL | Status: DC

## 2014-11-27 NOTE — Telephone Encounter (Signed)
Dr. Venia Minks is out of the office all week.

## 2014-11-27 NOTE — Telephone Encounter (Signed)
Pt is requesting samples of sitaGLIPtin (JANUVIA) 100 MG tablet.  TY#[phone]/[id number]

## 2014-11-27 NOTE — Telephone Encounter (Signed)
Januvia sample at front desk for pick up. Left patient a voicemail informing her that the medication is ready for pick up at the front desk.

## 2014-11-27 NOTE — Telephone Encounter (Signed)
She sees Dr. Venia Minks.  She has gotten samples in the past, most recently April 2016, if we have them.  Make sure ok with Dr. Venia Minks.  Thanks! -JB

## 2014-12-29 ENCOUNTER — Other Ambulatory Visit: Payer: Self-pay | Admitting: Family Medicine

## 2014-12-29 NOTE — Telephone Encounter (Signed)
Pt informed, will come pick up meds.

## 2014-12-29 NOTE — Telephone Encounter (Signed)
Pt is requesting samples of JANUVIA 100 MG if possible.  JQ#[phone]/[id number]

## 2014-12-29 NOTE — Telephone Encounter (Signed)
LMTCB

## 2014-12-29 NOTE — Telephone Encounter (Signed)
Ok to leave if available. Thanks.  

## 2015-01-19 ENCOUNTER — Ambulatory Visit (INDEPENDENT_AMBULATORY_CARE_PROVIDER_SITE_OTHER): Payer: Commercial Managed Care - HMO | Admitting: Family Medicine

## 2015-01-19 ENCOUNTER — Encounter: Payer: Self-pay | Admitting: Family Medicine

## 2015-01-19 VITALS — BP 110/64 | HR 84 | Temp 98.0°F | Resp 20 | Ht 59.0 in | Wt 224.0 lb

## 2015-01-19 DIAGNOSIS — J45909 Unspecified asthma, uncomplicated: Secondary | ICD-10-CM | POA: Insufficient documentation

## 2015-01-19 DIAGNOSIS — R059 Cough, unspecified: Secondary | ICD-10-CM

## 2015-01-19 DIAGNOSIS — J40 Bronchitis, not specified as acute or chronic: Secondary | ICD-10-CM | POA: Diagnosis not present

## 2015-01-19 DIAGNOSIS — R05 Cough: Secondary | ICD-10-CM

## 2015-01-19 MED ORDER — PREDNISONE 10 MG PO TABS: ORAL_TABLET | ORAL | Status: DC

## 2015-01-19 MED ORDER — MOMETASONE FURO-FORMOTEROL FUM 100-5 MCG/ACT IN AERO: 2.0000 | INHALATION_SPRAY | Freq: Every day | RESPIRATORY_TRACT | Status: DC

## 2015-01-19 MED ORDER — AZITHROMYCIN 250 MG PO TABS: ORAL_TABLET | ORAL | Status: DC

## 2015-01-19 NOTE — Progress Notes (Signed)
Patient ID: Alexis Lloyd, female   DOB: [birth year], 54 y.o.   MRN: [id number]       Patient: Alexis Lloyd Female    DOB: [birth year]   54 y.o.   MRN: [id number] Visit Date: 01/19/2015  Today's Provider: Margarita Rana, MD   Chief Complaint  Patient presents with  . Cough   Subjective:    HPI Cough: Patient complains of productive cough with sputum described as yellow.  Symptoms began 6 days ago on Sunday. Patient reports that symptoms are aggravated by reclining position and activity. Associated symptoms include:postnasal drip, low grade fever, vomiting. Patient reports that she has been vomiting (phlegm) in the mornings. Patient does have a history of asthma. Patient does have a history of environmental allergens. Patient has no recent travel. Patient does have a history of smoking. Patient has had a PPD done. Patient reports that she has been using her albuterol inhaler 2 puffs two times a day. Patient reports mild relief.       Allergies  Allergen Reactions  . Allegra [Fexofenadine] Nausea And Vomiting  . Avelox [Moxifloxacin] Other (See Comments)    Hallucinations  . Bactrim [Sulfamethoxazole-Trimethoprim] Other (See Comments)    unknown  . Etodolac Nausea Only  . Penicillins Diarrhea and Nausea And Vomiting  . Sulfa Antibiotics Other (See Comments)    unknown   Previous Medications   ALBUTEROL (PROVENTIL HFA) 108 (90 BASE) MCG/ACT INHALER    Inhale 2 puffs into the lungs every 6 (six) hours as needed.    AMLODIPINE (NORVASC) 5 MG TABLET    Take 5 mg by mouth daily.    CETIRIZINE (ZYRTEC) 10 MG TABLET    Take 10 mg by mouth daily.    CHOLECALCIFEROL (D-5000) 5000 UNITS TABS    Take 1 tablet by mouth daily.    LISINOPRIL-HYDROCHLOROTHIAZIDE (PRINZIDE,ZESTORETIC) 20-12.5 MG PER TABLET    Take 1 tablet by mouth daily.    MELOXICAM (MOBIC) 15 MG TABLET    TAKE 1 TABLET EVERY DAY   METFORMIN (GLUCOPHAGE) 1000 MG TABLET    Take 1,000 mg by mouth daily with breakfast.    MONTELUKAST (SINGULAIR) 10 MG TABLET    Take 10 mg by mouth at bedtime.    OMEGA-3 FATTY ACIDS PO    Take 1 tablet by mouth daily.    OMEPRAZOLE 20 MG TBEC    Take 20 mg by mouth.   PRAVASTATIN (PRAVACHOL) 40 MG TABLET    Take 40 mg by mouth daily.    SITAGLIPTIN (JANUVIA) 100 MG TABLET    Take 1 tablet (100 mg total) by mouth daily. Sample given #28  Lot # [id number] Exp: 03/2017   SPIRONOLACTONE (ALDACTONE) 25 MG TABLET    Take by mouth.   TAMOXIFEN (NOLVADEX) 10 MG TABLET    Take 10 mg by mouth daily.     Review of Systems  Constitutional: Positive for fever.  HENT: Positive for congestion and postnasal drip.   Respiratory: Positive for cough, shortness of breath and wheezing.   Gastrointestinal: Positive for nausea, vomiting and abdominal pain.  Allergic/Immunologic: Positive for environmental allergies.    Social History  Substance Use Topics  . Smoking status: Never Smoker   . Smokeless tobacco: Not on file  . Alcohol Use: No   Objective:   BP 110/64 mmHg  Pulse 84  Temp(Src) 98 F (36.7 C) (Oral)  Resp 20  Ht 4\' 11"  (1.499 m)  Wt 224 lb (101.606 kg)  BMI 45.22 kg/m2  SpO2 97%  Physical Exam  Constitutional: She is oriented to person, place, and time. She appears well-developed and well-nourished.  HENT:  Head: Normocephalic and atraumatic.  Right Ear: Tympanic membrane and external ear normal.  Left Ear: Tympanic membrane and external ear normal.  Nose: Mucosal edema and rhinorrhea present. Right sinus exhibits maxillary sinus tenderness. Left sinus exhibits maxillary sinus tenderness.  Mouth/Throat: Uvula is midline and oropharynx is clear and moist.  Eyes: Conjunctivae and EOM are normal. Pupils are equal, round, and reactive to light.  Neck: Normal range of motion. Neck supple.  Cardiovascular: Normal rate and regular rhythm.   Pulmonary/Chest: Effort normal and breath sounds normal. She has no wheezes. She has no rales.  Neurological: She is alert and oriented  to person, place, and time.        Assessment & Plan:     1. Cough Worsening. Start medication as below.   2. Bronchitis Worsening. Start medication as below.  Patient instructed to call back if condition worsens or does not improve.    - predniSONE (DELTASONE) 10 MG tablet; 6 po for 2 days and then 5 po for 2 days and then 4 po for 2 days and 3 po for 2 days and then 2 po for 2 days and then 1 po for 2 days.  Dispense: 42 tablet; Refill: 0 - azithromycin (ZITHROMAX) 250 MG tablet; 2 today and then one a day for 4 more days.  Dispense: 6 tablet; Refill: 0 - mometasone-formoterol (DULERA) 100-5 MCG/ACT AERO; Inhale 2 puffs into the lungs daily.  Dispense: 13 g; Refill: Lily, MD  [facility]

## 2015-01-24 ENCOUNTER — Other Ambulatory Visit: Payer: Self-pay | Admitting: Family Medicine

## 2015-01-24 DIAGNOSIS — J40 Bronchitis, not specified as acute or chronic: Secondary | ICD-10-CM

## 2015-01-25 ENCOUNTER — Encounter: Payer: Self-pay | Admitting: Family Medicine

## 2015-01-25 NOTE — Telephone Encounter (Signed)
This is a pt of Dr. Sharyon Medicus.  And last ov was on 01/19/2015. Next ov appointment is on 01/31/2015  Thanks,

## 2015-01-29 ENCOUNTER — Other Ambulatory Visit: Payer: Self-pay

## 2015-01-29 DIAGNOSIS — M1990 Unspecified osteoarthritis, unspecified site: Secondary | ICD-10-CM

## 2015-01-29 MED ORDER — MELOXICAM 15 MG PO TABS: 15.0000 mg | ORAL_TABLET | Freq: Every day | ORAL | Status: DC

## 2015-01-29 NOTE — Telephone Encounter (Signed)
Per patient not sure what strength she is taking but it should be the one we have on file. Per last refill sent over the RX for Meloxicam 15 mg.

## 2015-01-29 NOTE — Telephone Encounter (Signed)
Alexis Lloyd is requesting 10 mg of Meloxicam(Mobic). Called patient to verify mg patient is taking. No answer/LM to call back. Last Refilled was for 15 mg.  Thanks,  -Negan Grudzien

## 2015-01-31 ENCOUNTER — Telehealth: Payer: Self-pay | Admitting: Family Medicine

## 2015-01-31 ENCOUNTER — Ambulatory Visit: Payer: Self-pay | Admitting: Family Medicine

## 2015-01-31 DIAGNOSIS — J40 Bronchitis, not specified as acute or chronic: Secondary | ICD-10-CM

## 2015-01-31 MED ORDER — AZITHROMYCIN 250 MG PO TABS: ORAL_TABLET | ORAL | Status: DC

## 2015-01-31 NOTE — Telephone Encounter (Signed)
Sent rx. Please notify patient. Needs ov if not improved. Thanks.

## 2015-01-31 NOTE — Telephone Encounter (Signed)
Pt contacted office for refill request on the following medications: azithromycin (ZITHROMAX) 250 MG tablet to [facility]. Pt stated that she thinks she needs another round to get rid of her symptoms. Thanks TNP

## 2015-01-31 NOTE — Telephone Encounter (Signed)
LMTCB 01/31/2015  Thanks,   -Laura  

## 2015-02-06 ENCOUNTER — Other Ambulatory Visit: Payer: Self-pay | Admitting: *Deleted

## 2015-02-06 DIAGNOSIS — R920 Mammographic microcalcification found on diagnostic imaging of breast: Secondary | ICD-10-CM

## 2015-02-06 NOTE — Progress Notes (Signed)
Called patient today as requested by Jaclynn Guarneri in the [place] for assessment of financial needs.  Will assist patient with power, gas, and rent, through our [employer].  Patient stated she had not been scheduled for her mammogram.  Patient had a birads 3 mammogram 07/06/14.  Since her oncologist is no longer here, I discussed case with Dr. Oliva Bustard and he agreed to order the mammogram.  Patient is scheduled for 02/15/15 @ 1:40.  She is also scheduled for f/u with our new medical oncologist on 02/26/15 @ 10:00.

## 2015-02-08 ENCOUNTER — Other Ambulatory Visit: Payer: Self-pay | Admitting: Family Medicine

## 2015-02-08 NOTE — Telephone Encounter (Signed)
Pt is requesting samples of sitaGLIPtin (JANUVIA) 100 MG tablet. Pt wanted to know if Dr. Venia Minks knows anything about applying for assistance to help pay for this medication. Please advise. Thanks TNP

## 2015-02-08 NOTE — Telephone Encounter (Signed)
Spoke with Alexis Lloyd, she was requesting samples for Januvia 100 mg. There were none to give her, but she was advised that a note is placed in, so when we get more samples, we will give her a call.  She also wanted for MD Venia Minks if she knows about applying for assistance to help pay for this medication.  Please advise,  Thanks,

## 2015-02-09 NOTE — Telephone Encounter (Signed)
Do not know how to do this. She will have to contact the company. Thanks.

## 2015-02-13 NOTE — Telephone Encounter (Signed)
Left message to call back.  Thanks, 

## 2015-02-13 NOTE — Telephone Encounter (Signed)
Pt is returning call.  OB#[phone]/[id number]

## 2015-02-15 ENCOUNTER — Ambulatory Visit
Admission: RE | Admit: 2015-02-15 | Discharge: 2015-02-15 | Disposition: A | Discharge status: Home / Self Care | Payer: Commercial Managed Care - HMO | Source: Ambulatory Visit | Attending: Oncology | Admitting: Oncology

## 2015-02-15 DIAGNOSIS — R921 Mammographic calcification found on diagnostic imaging of breast: Secondary | ICD-10-CM | POA: Diagnosis not present

## 2015-02-15 DIAGNOSIS — R920 Mammographic microcalcification found on diagnostic imaging of breast: Secondary | ICD-10-CM

## 2015-02-15 NOTE — Telephone Encounter (Signed)
Pt advised as below. sd 

## 2015-02-16 ENCOUNTER — Telehealth: Payer: Self-pay | Admitting: *Deleted

## 2015-02-16 NOTE — Telephone Encounter (Signed)
Patient called to change the time of her appointment.  Discussed results of her abnormal mammogram, and recommended she follow-up with Dr. Tamala Julian prior to deciding not to have a biopsy.  States she does not want to do anything at this time.  Encouraged to discuss her results with her medical oncologist at her appointment on 02/26/15 @ 2:30.  She is agreeable.

## 2015-02-26 ENCOUNTER — Ambulatory Visit: Payer: Self-pay

## 2015-02-26 ENCOUNTER — Inpatient Hospital Stay: Payer: Commercial Managed Care - HMO | Admitting: Internal Medicine

## 2015-03-12 ENCOUNTER — Ambulatory Visit: Payer: Commercial Managed Care - HMO | Admitting: Hematology and Oncology

## 2015-03-16 ENCOUNTER — Ambulatory Visit: Payer: Commercial Managed Care - HMO | Admitting: Internal Medicine

## 2015-03-26 ENCOUNTER — Ambulatory Visit: Payer: Commercial Managed Care - HMO | Admitting: Hematology and Oncology

## 2015-04-03 ENCOUNTER — Inpatient Hospital Stay: Payer: Commercial Managed Care - HMO | Attending: Internal Medicine | Admitting: Hematology and Oncology

## 2015-04-03 ENCOUNTER — Telehealth: Payer: Self-pay

## 2015-04-03 NOTE — Telephone Encounter (Signed)
Pt called and states that she was diagnosed with breast cancer stage 1, she was seen a doctor that is now under [place]. She needs a referral to [facility] center to see the doctor to follow up on this, their number is [phone]. Please review-aa

## 2015-04-03 NOTE — Telephone Encounter (Signed)
Ok to refer but do we need the name of the doctor. I am unclear how to do this. Thanks.

## 2015-04-03 NOTE — Progress Notes (Signed)
This encounter was created in error - please disregard.

## 2015-04-04 NOTE — Telephone Encounter (Signed)
Pt was seen by by Dr Thresa Ross while see was practicing at [facility] in [place].She has since moved to [facility].Pt was advised that she will need to sign release of information for me to get records to make referral or sign release at the cancer center to have records sent to [facility]

## 2015-04-05 ENCOUNTER — Telehealth: Payer: Self-pay

## 2015-04-05 NOTE — Telephone Encounter (Signed)
Darden Dates from [facility] is calling regarding patient Alexis Lloyd. Stated the doctor patient was referred to is out of network.  Dr. Nadene Rubins, Oncology and Hematology.  Jeanetta's number is: [phone]  Thanks,  -Diane Mochizuki

## 2015-04-06 NOTE — Telephone Encounter (Signed)
Pt made her own appointment.(previous pt)the Patient advised that Dr Thresa Ross  In not in network and  [facility] denied request

## 2015-04-24 DIAGNOSIS — D0510 Intraductal carcinoma in situ of unspecified breast: Secondary | ICD-10-CM | POA: Insufficient documentation

## 2015-04-25 DIAGNOSIS — R920 Mammographic microcalcification found on diagnostic imaging of breast: Secondary | ICD-10-CM | POA: Insufficient documentation

## 2015-05-08 ENCOUNTER — Encounter: Payer: Self-pay | Admitting: Family Medicine

## 2015-05-08 ENCOUNTER — Ambulatory Visit (INDEPENDENT_AMBULATORY_CARE_PROVIDER_SITE_OTHER): Payer: Commercial Managed Care - HMO | Admitting: Family Medicine

## 2015-05-08 VITALS — BP 126/72 | HR 80 | Temp 98.3°F | Resp 16 | Wt 230.0 lb

## 2015-05-08 DIAGNOSIS — R920 Mammographic microcalcification found on diagnostic imaging of breast: Secondary | ICD-10-CM

## 2015-05-08 DIAGNOSIS — J40 Bronchitis, not specified as acute or chronic: Secondary | ICD-10-CM | POA: Diagnosis not present

## 2015-05-08 DIAGNOSIS — J069 Acute upper respiratory infection, unspecified: Secondary | ICD-10-CM | POA: Diagnosis not present

## 2015-05-08 MED ORDER — PREDNISONE 10 MG PO TABS: ORAL_TABLET | ORAL | Status: DC

## 2015-05-08 MED ORDER — AZITHROMYCIN 250 MG PO TABS: ORAL_TABLET | ORAL | Status: DC

## 2015-05-08 NOTE — Progress Notes (Signed)
Patient ID: Alexis Lloyd, female   DOB: 04-Dec-1960, 54 y.o.   MRN: [id number]         Patient: Alexis Lloyd Female    DOB: Jul 19, 1960   54 y.o.   MRN: [id number] Visit Date: 05/08/2015  Today's Provider: Margarita Rana, MD   Chief Complaint  Patient presents with  . URI   Subjective:    URI  This is a new problem. The current episode started in the past 7 days. The problem has been gradually worsening. There has been no fever. Associated symptoms include congestion, coughing, a plugged ear sensation, rhinorrhea, sinus pain and wheezing. Pertinent negatives include no abdominal pain, chest pain, diarrhea, dysuria, ear pain (Ears feel full), headaches, nausea, neck pain, sneezing, sore throat, swollen glands or vomiting. She has tried nothing for the symptoms. The treatment provided no relief.   Was seen in August for similar and treated with prednisone, Dulera and antibiotics.  Did get better.      Allergies  Allergen Reactions  . Allegra [Fexofenadine] Nausea And Vomiting  . Avelox [Moxifloxacin] Other (See Comments)    Hallucinations  . Bactrim [Sulfamethoxazole-Trimethoprim] Other (See Comments)    unknown  . Etodolac Nausea Only  . Penicillins Diarrhea and Nausea And Vomiting  . Sulfa Antibiotics Other (See Comments)    unknown   Previous Medications   ALBUTEROL (PROVENTIL HFA) 108 (90 BASE) MCG/ACT INHALER    Inhale 2 puffs into the lungs every 6 (six) hours as needed.    AMLODIPINE (NORVASC) 5 MG TABLET    Take 5 mg by mouth daily.    AZITHROMYCIN (ZITHROMAX) 250 MG TABLET    2 today and then one a day for 4 more days.   CETIRIZINE (ZYRTEC) 10 MG TABLET    Take 10 mg by mouth daily.    CHOLECALCIFEROL (D-5000) 5000 UNITS TABS    Take 1 tablet by mouth daily.    LISINOPRIL-HYDROCHLOROTHIAZIDE (PRINZIDE,ZESTORETIC) 20-12.5 MG PER TABLET    Take 1 tablet by mouth daily.    MELOXICAM (MOBIC) 15 MG TABLET    Take 1 tablet (15 mg total) by mouth daily.   METFORMIN  (GLUCOPHAGE) 1000 MG TABLET    Take 1,000 mg by mouth daily with breakfast.    MOMETASONE-FORMOTEROL (DULERA) 100-5 MCG/ACT AERO    Inhale 2 puffs into the lungs daily.   MONTELUKAST (SINGULAIR) 10 MG TABLET    TAKE 1 TABLET EVERY DAY   OMEGA-3 FATTY ACIDS PO    Take 1 tablet by mouth daily.    OMEPRAZOLE 20 MG TBEC    Take 20 mg by mouth.   PRAVASTATIN (PRAVACHOL) 40 MG TABLET    Take 40 mg by mouth daily.    PREDNISONE (DELTASONE) 10 MG TABLET    6 po for 2 days and then 5 po for 2 days and then 4 po for 2 days and 3 po for 2 days and then 2 po for 2 days and then 1 po for 2 days.   SITAGLIPTIN (JANUVIA) 100 MG TABLET    Take 1 tablet (100 mg total) by mouth daily. Sample given #28  Lot # ID:[id number] Exp: 03/2017   SPIRONOLACTONE (ALDACTONE) 25 MG TABLET    Take by mouth.   TAMOXIFEN (NOLVADEX) 10 MG TABLET    Take 10 mg by mouth daily.     Review of Systems  Constitutional: Positive for fatigue. Negative for fever, chills, diaphoresis, activity change, appetite change and unexpected weight change.  HENT: Positive  for congestion, postnasal drip, rhinorrhea and sinus pressure. Negative for ear discharge, ear pain (Ears feel full), nosebleeds, sneezing, sore throat, tinnitus, trouble swallowing and voice change.   Eyes: Positive for discharge (Watery). Negative for photophobia, pain, redness, itching and visual disturbance.  Respiratory: Positive for cough and wheezing. Negative for apnea, choking, chest tightness, shortness of breath and stridor.   Cardiovascular: Negative for chest pain, palpitations and leg swelling.  Gastrointestinal: Positive for constipation. Negative for nausea, vomiting, abdominal pain, diarrhea, blood in stool, abdominal distention, anal bleeding and rectal pain.  Genitourinary: Negative for dysuria.  Musculoskeletal: Positive for myalgias. Negative for neck pain.  Neurological: Positive for weakness. Negative for dizziness, light-headedness and headaches.    Social  History  Substance Use Topics  . Smoking status: Never Smoker   . Smokeless tobacco: Not on file  . Alcohol Use: No   Objective:   BP 126/72 mmHg  Pulse 80  Temp(Src) 98.3 F (36.8 C) (Oral)  Resp 16  Wt 230 lb (104.327 kg)  SpO2 97%  Physical Exam  Constitutional: She is oriented to person, place, and time. She appears well-developed and well-nourished.  HENT:  Head: Normocephalic and atraumatic.  Right Ear: External ear normal.  Left Ear: External ear normal.  Mouth/Throat: Oropharynx is clear and moist.  Eyes: Conjunctivae and EOM are normal. Pupils are equal, round, and reactive to light.  Neck: Normal range of motion. Neck supple.  Cardiovascular: Normal rate and regular rhythm.   Pulmonary/Chest: Effort normal and breath sounds normal.  Neurological: She is alert and oriented to person, place, and time.  Psychiatric: She has a normal mood and affect. Her behavior is normal. Judgment and thought content normal.      Assessment & Plan:     1. Upper respiratory infection Worsening.  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    2. Bronchitis New problem. Worsening. Will treat for bronchitis. Complicated by breast biopsy in scheduled next week. Patient instructed to call back if condition worsens or does not improve.    - predniSONE (DELTASONE) 10 MG tablet; 6 po for 1 day and then 5 po for 1 day and then 4 po for 1 day and 3 po for 1 day and then 2 po for 1 day and then 1 po for 1 day.  Dispense: 21 tablet; Refill: 0 - azithromycin (ZITHROMAX) 250 MG tablet; 2 today and then one a day for 4 more days.  Dispense: 6 tablet; Refill: 0  3. Abnormal finding on mammography, microcalcification Scheduled for biopsy next week.     Margarita Rana, MD  [facility]

## 2015-05-30 ENCOUNTER — Ambulatory Visit: Payer: Self-pay | Admitting: Family Medicine

## 2015-07-03 DIAGNOSIS — D0510 Intraductal carcinoma in situ of unspecified breast: Secondary | ICD-10-CM | POA: Diagnosis not present

## 2015-07-09 ENCOUNTER — Telehealth: Payer: Self-pay

## 2015-07-09 NOTE — Telephone Encounter (Signed)
Can see another provider if they have an opening. Thanks.

## 2015-07-09 NOTE — Telephone Encounter (Signed)
Patient called wanted to see Doctor Venia Minks today or tomorrow. Wants to be work in. Offered patient an appointment with other providers patient said no.  Patient is having bad odor to her urine every morning. No painful urination. Patient phone number is : [phone]  Thanks,  -Cordae Mccarey

## 2015-07-09 NOTE — Telephone Encounter (Signed)
Pt refused to see another provider.  I scheduled her for 07/16/2015 at 4:30.  She did agree if her symptoms worsening she will call and schedule an appointment sooner.   Thanks,   -Mickel Baas

## 2015-07-12 ENCOUNTER — Other Ambulatory Visit: Payer: Self-pay | Admitting: Family Medicine

## 2015-07-12 DIAGNOSIS — E1142 Type 2 diabetes mellitus with diabetic polyneuropathy: Secondary | ICD-10-CM

## 2015-07-13 DIAGNOSIS — E1165 Type 2 diabetes mellitus with hyperglycemia: Secondary | ICD-10-CM | POA: Insufficient documentation

## 2015-07-13 DIAGNOSIS — E1140 Type 2 diabetes mellitus with diabetic neuropathy, unspecified: Secondary | ICD-10-CM | POA: Insufficient documentation

## 2015-07-13 DIAGNOSIS — IMO0002 Reserved for concepts with insufficient information to code with codable children: Secondary | ICD-10-CM | POA: Insufficient documentation

## 2015-07-16 ENCOUNTER — Ambulatory Visit (INDEPENDENT_AMBULATORY_CARE_PROVIDER_SITE_OTHER): Payer: Medicare Other | Admitting: Family Medicine

## 2015-07-16 ENCOUNTER — Encounter: Payer: Self-pay | Admitting: Family Medicine

## 2015-07-16 VITALS — BP 118/72 | HR 88 | Temp 98.8°F | Resp 16 | Wt 224.0 lb

## 2015-07-16 DIAGNOSIS — M79672 Pain in left foot: Secondary | ICD-10-CM | POA: Diagnosis not present

## 2015-07-16 DIAGNOSIS — E1143 Type 2 diabetes mellitus with diabetic autonomic (poly)neuropathy: Secondary | ICD-10-CM

## 2015-07-16 DIAGNOSIS — E1142 Type 2 diabetes mellitus with diabetic polyneuropathy: Secondary | ICD-10-CM | POA: Diagnosis not present

## 2015-07-16 DIAGNOSIS — I10 Essential (primary) hypertension: Secondary | ICD-10-CM | POA: Diagnosis not present

## 2015-07-16 DIAGNOSIS — N3090 Cystitis, unspecified without hematuria: Secondary | ICD-10-CM

## 2015-07-16 DIAGNOSIS — E7800 Pure hypercholesterolemia, unspecified: Secondary | ICD-10-CM | POA: Diagnosis not present

## 2015-07-16 DIAGNOSIS — M79673 Pain in unspecified foot: Secondary | ICD-10-CM | POA: Insufficient documentation

## 2015-07-16 LAB — POCT URINALYSIS DIPSTICK
Bilirubin, UA: NEGATIVE
Glucose, UA: 1000
Ketones, UA: NEGATIVE
Leukocytes, UA: NEGATIVE
Protein, UA: NEGATIVE
Spec Grav, UA: >=1.03
Urobilinogen, UA: 0.2
pH, UA: 6

## 2015-07-16 LAB — POCT GLYCOSYLATED HEMOGLOBIN (HGB A1C): Hemoglobin A1C: 8.3

## 2015-07-16 LAB — POCT UA - MICROALBUMIN: Microalbumin Ur, POC: 50 mg/L

## 2015-07-16 MED ORDER — NITROFURANTOIN MONOHYD MACRO 100 MG PO CAPS: 100.0000 mg | ORAL_CAPSULE | Freq: Two times a day (BID) | ORAL | Status: DC

## 2015-07-16 NOTE — Progress Notes (Signed)
Patient ID: Alexis Lloyd, female   DOB: 12/18/1960, 55 y.o.   MRN: [id number]         Patient: Alexis Lloyd Female    DOB: 1960/11/17   55 y.o.   MRN: [id number] Visit Date: 07/16/2015  Today's Provider: Margarita Rana, MD   Chief Complaint  Patient presents with  . Diabetes  . Hypertension  . Hyperlipidemia   Subjective:    Diabetes She presents for her follow-up diabetic visit. She has type 2 diabetes mellitus. Pertinent negatives for hypoglycemia include no dizziness, headaches or sweats. Associated symptoms include blurred vision, polyuria and visual change. Pertinent negatives for diabetes include no chest pain, no fatigue, no foot paresthesias, no foot ulcerations, no polydipsia, no polyphagia, no weakness and no weight loss.  Hypertension This is a chronic problem. The problem is unchanged. The problem is controlled. Associated symptoms include blurred vision and peripheral edema. Pertinent negatives include no chest pain, headaches, malaise/fatigue, palpitations, PND, shortness of breath or sweats. Risk factors for coronary artery disease include diabetes mellitus, dyslipidemia and obesity. There are no compliance problems.   Hyperlipidemia This is a chronic problem. The problem is controlled. Recent lipid tests were reviewed and are normal. Pertinent negatives include no chest pain or shortness of breath.  Foot Injury  The incident occurred 12 to 24 hours ago. There was no injury mechanism. The pain is present in the left foot. The quality of the pain is described as aching. The pain is at a severity of 8/10. The pain has been constant since onset. Pertinent negatives include no inability to bear weight, loss of motion, loss of sensation, muscle weakness, numbness or tingling. She reports no foreign bodies present. She has tried nothing for the symptoms.    Lab Results  Component Value Date   CHOL 146 08/03/2014   HDL 25* 08/03/2014   LDLCALC 83 08/03/2014   TRIG 189*  08/03/2014   Lab Results  Component Value Date   HGBA1C 8.0* 11/03/2014       Allergies  Allergen Reactions  . Allegra [Fexofenadine] Nausea And Vomiting  . Avelox [Moxifloxacin] Other (See Comments)    Hallucinations  . Bactrim [Sulfamethoxazole-Trimethoprim] Other (See Comments)    unknown  . Etodolac Nausea Only  . Penicillins Diarrhea and Nausea And Vomiting  . Sulfa Antibiotics Other (See Comments)    unknown   Previous Medications   ACCU-CHEK FASTCLIX LANCETS MISC    To check blood sugars daily.  DX: E11.9   ALBUTEROL (PROVENTIL HFA) 108 (90 BASE) MCG/ACT INHALER    Inhale 2 puffs into the lungs every 6 (six) hours as needed.    AMLODIPINE (NORVASC) 5 MG TABLET    Take 5 mg by mouth daily.    AZITHROMYCIN (ZITHROMAX) 250 MG TABLET    2 today and then one a day for 4 more days.   BLOOD GLUCOSE MONITORING SUPPL (ACCU-CHEK AVIVA PLUS) W/DEVICE KIT    USE  DAILY   CETIRIZINE (ZYRTEC) 10 MG TABLET    Take 10 mg by mouth daily.    CHOLECALCIFEROL (D-5000) 5000 UNITS TABS    Take 1 tablet by mouth daily.    LISINOPRIL-HYDROCHLOROTHIAZIDE (PRINZIDE,ZESTORETIC) 20-12.5 MG PER TABLET    Take 1 tablet by mouth daily.    MELOXICAM (MOBIC) 15 MG TABLET    Take 1 tablet (15 mg total) by mouth daily.   METFORMIN (GLUCOPHAGE) 1000 MG TABLET    Take 1,000 mg by mouth daily with breakfast.    MOMETASONE-FORMOTEROL (  DULERA) 100-5 MCG/ACT AERO    Inhale 2 puffs into the lungs daily.   MONTELUKAST (SINGULAIR) 10 MG TABLET    TAKE 1 TABLET EVERY DAY   OMEGA-3 FATTY ACIDS PO    Take 1 tablet by mouth daily.    OMEPRAZOLE 20 MG TBEC    Take 20 mg by mouth.   PRAVASTATIN (PRAVACHOL) 40 MG TABLET    Take 40 mg by mouth daily.    PREDNISONE (DELTASONE) 10 MG TABLET    6 po for 2 days and then 5 po for 2 days and then 4 po for 2 days and 3 po for 2 days and then 2 po for 2 days and then 1 po for 2 days.   PREDNISONE (DELTASONE) 10 MG TABLET    6 po for 1 day and then 5 po for 1 day and then 4 po for  1 day and 3 po for 1 day and then 2 po for 1 day and then 1 po for 1 day.   SITAGLIPTIN (JANUVIA) 100 MG TABLET    Take 1 tablet (100 mg total) by mouth daily. Sample given #28  Lot # [id number] Exp: 03/2017   SPIRONOLACTONE (ALDACTONE) 25 MG TABLET    Take by mouth.   TAMOXIFEN (NOLVADEX) 10 MG TABLET    Take 10 mg by mouth daily.     Review of Systems  Constitutional: Negative.  Negative for weight loss, malaise/fatigue and fatigue.  Eyes: Positive for blurred vision.  Respiratory: Negative.  Negative for shortness of breath.   Cardiovascular: Negative.  Negative for chest pain, palpitations and PND.  Gastrointestinal: Negative.   Endocrine: Positive for polyuria. Negative for polydipsia and polyphagia.  Genitourinary: Negative.   Musculoskeletal: Positive for joint swelling and arthralgias (Left foot pain).  Neurological: Negative for dizziness, tingling, weakness, light-headedness, numbness and headaches.    Social History  Substance Use Topics  . Smoking status: Never Smoker   . Smokeless tobacco: Not on file  . Alcohol Use: No   Objective:   BP 118/72 mmHg  Pulse 88  Temp(Src) 98.8 F (37.1 C) (Oral)  Resp 16  Wt 224 lb (101.606 kg)  Results for orders placed or performed in visit on 07/16/15  POCT glycosylated hemoglobin (Hb A1C)  Result Value Ref Range   Hemoglobin A1C 8.3   POCT UA - Microalbumin  Result Value Ref Range   Microalbumin Ur, POC 50 mg/L  POCT urinalysis dipstick  Result Value Ref Range   Color, UA Yellow    Clarity, UA Clear    Glucose, UA 1000    Bilirubin, UA Negative    Ketones, UA Negative    Spec Grav, UA >=1.030    Blood, UA Trace    pH, UA 6.0    Protein, UA Negative    Urobilinogen, UA 0.2    Nitrite, UA Postive    Leukocytes, UA Negative Negative     Physical Exam  Constitutional: She is oriented to person, place, and time. She appears well-developed and well-nourished.  Cardiovascular: Normal rate, regular rhythm, normal heart  sounds and intact distal pulses.   Pulmonary/Chest: Effort normal and breath sounds normal.  Neurological: She is alert and oriented to person, place, and time.  Psychiatric: She has a normal mood and affect. Her behavior is normal. Judgment and thought content normal.   Diabetic Foot Exam - Simple   Simple Foot Form  Diabetic Foot exam was performed with the following findings:  Yes 07/16/2015  5:11  PM  Visual Inspection  See comments:  Yes  Sensation Testing  See comments:  Yes  Pulse Check  Posterior Tibialis and Dorsalis pulse intact bilaterally:  Yes  Comments  Pes Planus noted.  Sensation decreased bilaterally. Left worse than right.          Assessment & Plan:     1. Cystitis Nitrated positive. Does have symptoms.  Will treat. Send for culture.  - Urine Culture - POCT urinalysis dipstick - Urinalysis, Routine w reflex microscopic - nitrofurantoin, macrocrystal-monohydrate, (MACROBID) 100 MG capsule; Take 1 capsule (100 mg total) by mouth 2 (two) times daily.  Dispense: 14 capsule; Refill: 0  2. Essential hypertension Condition is stable. Please continue current medication and  plan of care as noted.    3. Type 2 diabetes mellitus with diabetic polyneuropathy, without long-term current use of insulin (HCC) Increased still. Does not want another medication. Will follow up with endocrinologist at [facility].   - POCT glycosylated hemoglobin (Hb A1C) - POCT UA - Microalbumin Results for orders placed or performed in visit on 07/16/15  POCT glycosylated hemoglobin (Hb A1C)  Result Value Ref Range   Hemoglobin A1C 8.3   POCT UA - Microalbumin  Result Value Ref Range   Microalbumin Ur, POC 50 mg/L  POCT urinalysis dipstick  Result Value Ref Range   Color, UA Yellow    Clarity, UA Clear    Glucose, UA 1000    Bilirubin, UA Negative    Ketones, UA Negative    Spec Grav, UA >=1.030    Blood, UA Trace    pH, UA 6.0    Protein, UA Negative    Urobilinogen, UA 0.2     Nitrite, UA Postive    Leukocytes, UA Negative Negative   4. Hypercholesteremia Check labs.  - CBC with Differential/Platelet - Comprehensive Metabolic Panel (CMET) - Lipid panel  5. Left foot pain Suspect related to shoes.   6. Diabetic autonomic neuropathy associated with type 2 diabetes mellitus ([place]) Wrote for new diabetic shoes.      Patient was seen and examined by Jerrell Belfast, MD, and note scribed by Ashley Royalty, CMA.  I have reviewed the document for accuracy and completeness and I agree with above. - Jerrell Belfast, MD   Margarita Rana, MD  [facility]

## 2015-07-17 ENCOUNTER — Other Ambulatory Visit: Payer: Self-pay

## 2015-07-17 DIAGNOSIS — N3090 Cystitis, unspecified without hematuria: Secondary | ICD-10-CM

## 2015-07-17 LAB — URINALYSIS, ROUTINE W REFLEX MICROSCOPIC
Bilirubin, UA: NEGATIVE
Ketones, UA: NEGATIVE
Leukocytes, UA: NEGATIVE
Nitrite, UA: POSITIVE — AB
RBC, UA: NEGATIVE
Specific Gravity, UA: >=1.03 — AB (ref 1.005–1.030)
Urobilinogen, Ur: 0.2 mg/dL (ref 0.2–1.0)
pH, UA: 6 (ref 5.0–7.5)

## 2015-07-17 LAB — MICROSCOPIC EXAMINATION
Casts: NONE SEEN /lpf
WBC, UA: NONE SEEN /hpf (ref 0–?)

## 2015-07-17 MED ORDER — NITROFURANTOIN MONOHYD MACRO 100 MG PO CAPS: 100.0000 mg | ORAL_CAPSULE | Freq: Two times a day (BID) | ORAL | Status: DC

## 2015-07-18 DIAGNOSIS — E7800 Pure hypercholesterolemia, unspecified: Secondary | ICD-10-CM | POA: Diagnosis not present

## 2015-07-19 ENCOUNTER — Telehealth: Payer: Self-pay

## 2015-07-19 LAB — LIPID PANEL
Chol/HDL Ratio: 5.9 ratio units — ABNORMAL HIGH (ref 0.0–4.4)
Cholesterol, Total: 158 mg/dL (ref 100–199)
HDL: 27 mg/dL — ABNORMAL LOW (ref >39)
LDL Calculated: 95 mg/dL (ref 0–99)
Triglycerides: 178 mg/dL — ABNORMAL HIGH (ref 0–149)
VLDL Cholesterol Cal: 36 mg/dL (ref 5–40)

## 2015-07-19 LAB — CBC WITH DIFFERENTIAL/PLATELET
Basophils Absolute: 0 10*3/uL (ref 0.0–0.2)
Basos: 1 %
EOS (ABSOLUTE): 0.1 10*3/uL (ref 0.0–0.4)
Eos: 1 %
Hematocrit: 35.1 % (ref 34.0–46.6)
Hemoglobin: 11.9 g/dL (ref 11.1–15.9)
Immature Grans (Abs): 0 10*3/uL (ref 0.0–0.1)
Immature Granulocytes: 0 %
Lymphocytes Absolute: 1.9 10*3/uL (ref 0.7–3.1)
Lymphs: 36 %
MCH: 29.3 pg (ref 26.6–33.0)
MCHC: 33.9 g/dL (ref 31.5–35.7)
MCV: 87 fL (ref 79–97)
Monocytes Absolute: 0.4 10*3/uL (ref 0.1–0.9)
Monocytes: 7 %
Neutrophils Absolute: 3 10*3/uL (ref 1.4–7.0)
Neutrophils: 55 %
Platelets: 236 10*3/uL (ref 150–379)
RBC: 4.06 x10E6/uL (ref 3.77–5.28)
RDW: 13.7 % (ref 12.3–15.4)
WBC: 5.4 10*3/uL (ref 3.4–10.8)

## 2015-07-19 LAB — COMPREHENSIVE METABOLIC PANEL
ALT: 41 IU/L — ABNORMAL HIGH (ref 0–32)
AST: 22 IU/L (ref 0–40)
Albumin/Globulin Ratio: 1.2 (ref 1.1–2.5)
Albumin: 3.6 g/dL (ref 3.5–5.5)
Alkaline Phosphatase: 47 IU/L (ref 39–117)
BUN/Creatinine Ratio: 24 — ABNORMAL HIGH (ref 9–23)
BUN: 14 mg/dL (ref 6–24)
Bilirubin Total: 0.3 mg/dL (ref 0.0–1.2)
CO2: 25 mmol/L (ref 18–29)
Calcium: 9.1 mg/dL (ref 8.7–10.2)
Chloride: 101 mmol/L (ref 96–106)
Creatinine, Ser: 0.59 mg/dL (ref 0.57–1.00)
GFR calc Af Amer: 118 mL/min/{1.73_m2} (ref >59)
GFR calc non Af Amer: 103 mL/min/{1.73_m2} (ref >59)
Globulin, Total: 3.1 g/dL (ref 1.5–4.5)
Glucose: 165 mg/dL — ABNORMAL HIGH (ref 65–99)
Potassium: 4 mmol/L (ref 3.5–5.2)
Sodium: 143 mmol/L (ref 134–144)
Total Protein: 6.7 g/dL (ref 6.0–8.5)

## 2015-07-19 LAB — URINE CULTURE

## 2015-07-19 NOTE — Telephone Encounter (Signed)
-----   Message from Margarita Rana, MD sent at 07/19/2015  1:58 PM EST ----- Does have UTI. Treated. Please see if improved. Thanks.

## 2015-07-19 NOTE — Telephone Encounter (Signed)
LMTCB 07/19/2015  Thanks,   -Mickel Baas

## 2015-07-19 NOTE — Telephone Encounter (Signed)
-----   Message from Margarita Rana, MD sent at 07/19/2015 11:44 AM EST ----- Labs stable. Please notify patient. Thanks.

## 2015-07-19 NOTE — Telephone Encounter (Signed)
Pt advised-aa 

## 2015-07-19 NOTE — Telephone Encounter (Signed)
Advised pt of lab results. Pt verbally acknowledges understanding. Swain Acree Drozdowski, CMA   

## 2015-07-24 ENCOUNTER — Other Ambulatory Visit: Payer: Self-pay | Admitting: Family Medicine

## 2015-07-24 DIAGNOSIS — E1142 Type 2 diabetes mellitus with diabetic polyneuropathy: Secondary | ICD-10-CM

## 2015-07-24 DIAGNOSIS — I10 Essential (primary) hypertension: Secondary | ICD-10-CM

## 2015-07-24 DIAGNOSIS — J40 Bronchitis, not specified as acute or chronic: Secondary | ICD-10-CM

## 2015-07-24 DIAGNOSIS — J45909 Unspecified asthma, uncomplicated: Secondary | ICD-10-CM

## 2015-07-24 DIAGNOSIS — M1990 Unspecified osteoarthritis, unspecified site: Secondary | ICD-10-CM

## 2015-07-24 DIAGNOSIS — J302 Other seasonal allergic rhinitis: Secondary | ICD-10-CM

## 2015-07-24 DIAGNOSIS — E7800 Pure hypercholesterolemia, unspecified: Secondary | ICD-10-CM

## 2015-07-24 MED ORDER — PRAVASTATIN SODIUM 40 MG PO TABS: 40.0000 mg | ORAL_TABLET | Freq: Every day | ORAL | Status: DC

## 2015-07-24 MED ORDER — LISINOPRIL-HYDROCHLOROTHIAZIDE 20-12.5 MG PO TABS: 1.0000 | ORAL_TABLET | Freq: Every day | ORAL | Status: DC

## 2015-07-24 MED ORDER — MONTELUKAST SODIUM 10 MG PO TABS: 10.0000 mg | ORAL_TABLET | Freq: Every day | ORAL | Status: DC

## 2015-07-24 MED ORDER — CETIRIZINE HCL 10 MG PO TABS
10.0000 mg | ORAL_TABLET | Freq: Every day | ORAL | Status: DC
Start: 2015-07-24 — End: 2022-05-26

## 2015-07-24 MED ORDER — MELOXICAM 15 MG PO TABS: 15.0000 mg | ORAL_TABLET | Freq: Every day | ORAL | Status: DC

## 2015-07-24 MED ORDER — ACCU-CHEK FASTCLIX LANCETS MISC: Status: DC

## 2015-07-24 MED ORDER — ALBUTEROL SULFATE HFA 108 (90 BASE) MCG/ACT IN AERS: 2.0000 | INHALATION_SPRAY | Freq: Four times a day (QID) | RESPIRATORY_TRACT | Status: DC | PRN

## 2015-07-24 MED ORDER — SPIRONOLACTONE 25 MG PO TABS: 25.0000 mg | ORAL_TABLET | Freq: Once | ORAL | Status: DC

## 2015-07-24 NOTE — Telephone Encounter (Signed)
Pt contacted office for refill request on the following medications:  [place] CB#[phone]/MW  Blood Glucose Monitoring Suppl (ACCU-CHEK AVIVA PLUS) w/Device KIT  ACCU-CHEK FASTCLIX LANCETS MISC  albuterol (PROVENTIL HFA) 108 (90 BASE) MCG/ACT inhaler  amLODipine (NORVASC) 5 MG tablet  cetirizine (ZYRTEC) 10 MG tablet  lisinopril-hydrochlorothiazide (PRINZIDE,ZESTORETIC) 20-12.5 MG per tablet  meloxicam (MOBIC) 15 MG tablet  montelukast (SINGULAIR) 10 MG tablet  pravastatin (PRAVACHOL) 40 MG tablet  spironolactone (ALDACTONE) 25 MG tablet

## 2015-08-06 ENCOUNTER — Ambulatory Visit (INDEPENDENT_AMBULATORY_CARE_PROVIDER_SITE_OTHER): Payer: Medicare Other | Admitting: Family Medicine

## 2015-08-06 ENCOUNTER — Encounter: Payer: Self-pay | Admitting: Family Medicine

## 2015-08-06 VITALS — BP 110/64 | HR 80 | Temp 97.6°F | Resp 20 | Ht 60.0 in | Wt 229.0 lb

## 2015-08-06 DIAGNOSIS — Z23 Encounter for immunization: Secondary | ICD-10-CM | POA: Diagnosis not present

## 2015-08-06 DIAGNOSIS — Z0000 Encounter for general adult medical examination without abnormal findings: Secondary | ICD-10-CM

## 2015-08-06 NOTE — Progress Notes (Signed)
Patient ID: DOROTHA HIRSCHI, female   DOB: [birth year], 55 y.o.   MRN: [id number]       Patient: Alexis Lloyd, Female    DOB: 03-Jan-1961, 55 y.o.   MRN: [id number] Visit Date: 08/06/2015  Today's Provider: Margarita Rana, MD   Chief Complaint  Patient presents with  . Annual Exam   Subjective:    Annual physical exam Alexis Lloyd is a 55 y.o. female who presents today for health maintenance and complete physical. She feels well. She reports exercising walking daily 30 minutes. She reports she is sleeping well. 08/02/14 CPE 08/02/14 Pap-neg 02/15/15 Mammogram-BI-RADS 4 05/16/15 Left Breast Biopsy F/B [facility] Dr. Thresa Ross -----------------------------------------------------------------   Review of Systems  Constitutional: Negative.   HENT: Positive for dental problem.   Eyes: Negative.   Respiratory: Negative.   Cardiovascular: Negative.   Gastrointestinal: Negative.   Endocrine: Negative.   Genitourinary: Negative.   Musculoskeletal: Negative.   Skin: Negative.   Allergic/Immunologic: Negative.   Neurological: Negative.   Psychiatric/Behavioral: Negative.     Social History      She  reports that she has never smoked. She has never used smokeless tobacco. She reports that she does not drink alcohol or use illicit drugs.       Social History   Social History  . Marital Status: Married    Spouse Name: N/A  . Number of Children: N/A  . Years of Education: N/A   Social History Main Topics  . Smoking status: Never Smoker   . Smokeless tobacco: Never Used  . Alcohol Use: No  . Drug Use: No  . Sexual Activity: Not Asked   Other Topics Concern  . None   Social History Narrative    Past Medical History  Diagnosis Date  . Diabetes mellitus without complication ([place])   . Hypertension   . GERD (gastroesophageal reflux disease)   . Sleep apnea   . Allergy   . Hyperlipidemia   . Osteoporosis   . Breast cancer ([place])   . Breast cancer, left breast ([place])  10/04/2014     Patient Active Problem List   Diagnosis Date Noted  . Foot pain 07/16/2015  . Diabetes ([place]) 07/13/2015  . Abnormal finding on mammography, microcalcification 04/25/2015  . Intraductal carcinoma of breast 04/24/2015  . Asthmatic bronchitis 01/19/2015  . Bronchitis 01/19/2015  . Abnormal breast finding 11/18/2014  . Airway hyperreactivity 11/18/2014  . Body mass index of 60 or higher ([place]) 11/18/2014  . Breast cancer, female ([place]) 11/18/2014  . Breast cyst 11/18/2014  . Diabetic neuropathy ([place]) 11/18/2014  . Acid reflux 11/18/2014  . 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 Hypercholesteremia 11/18/2014  . BP (high blood pressure) 11/18/2014  . Adiposity 11/18/2014  . Arthritis, degenerative 11/18/2014  . Allergic rhinitis, seasonal 11/18/2014  . Apnea, sleep 11/18/2014  . AC (acromioclavicular) arthritis 11/08/2014  . Breast cancer, left breast ([place]) 10/04/2014  . Arthritis of knee, degenerative 11/14/2013    Past Surgical History  Procedure Laterality Date  . Breast lumpectomy Left 06/17/2013  . Breast reduction surgery Bilateral   . Cesarean section      x's 2  . Cholecystectomy  09/2001  . Abdominal hysterectomy      due to fibroid tumors 1990 Dr. Quenten Raven  . Reduction mammaplasty    . Breast cyst aspiration Left   . Breast biopsy Left     negative  . Breast biopsy Left     04/2013 positive  . Breast excisional biopsy Left  2015  . S/p radiation therapy Left     2015    Family History        Family Status  Relation Status Death Age  . Mother Deceased 80    colon cancer  . Brother Alive   . Father Deceased     MI  . Sister Deceased 26 days old    heart disorder  . Brother Deceased 45    stroke  . Brother Deceased 51    stomach cancer        Her family history includes Allergic rhinitis in her brother; Asthma in her brother; Breast cancer in her maternal grandmother; Breast cancer (age of onset: 71) in her mother; Cancer in her mother;  Hypertension in her brother and mother.    Allergies  Allergen Reactions  . Allegra [Fexofenadine] Nausea And Vomiting  . Avelox [Moxifloxacin] Other (See Comments)    Hallucinations  . Bactrim [Sulfamethoxazole-Trimethoprim] Other (See Comments)    unknown  . Etodolac Nausea Only  . Penicillins Diarrhea and Nausea And Vomiting  . Sulfa Antibiotics Other (See Comments)    unknown    Previous Medications   ACCU-CHEK FASTCLIX LANCETS MISC    To check blood sugars daily.  DX: E11.9   ALBUTEROL (PROVENTIL HFA) 108 (90 BASE) MCG/ACT INHALER    Inhale 2 puffs into the lungs every 6 (six) hours as needed.   AMLODIPINE (NORVASC) 5 MG TABLET    Take 5 mg by mouth daily.    BLOOD GLUCOSE MONITORING SUPPL (ACCU-CHEK AVIVA PLUS) W/DEVICE KIT    USE  DAILY   CETIRIZINE (ZYRTEC) 10 MG TABLET    Take 1 tablet (10 mg total) by mouth daily.   CHOLECALCIFEROL (D-5000) 5000 UNITS TABS    Take 1 tablet by mouth daily.    LISINOPRIL-HYDROCHLOROTHIAZIDE (PRINZIDE,ZESTORETIC) 20-12.5 MG TABLET    Take 1 tablet by mouth daily.   MELOXICAM (MOBIC) 15 MG TABLET    Take 1 tablet (15 mg total) by mouth daily.   METFORMIN (GLUCOPHAGE) 1000 MG TABLET    Take 1,000 mg by mouth daily with breakfast.    MOMETASONE-FORMOTEROL (DULERA) 100-5 MCG/ACT AERO    Inhale 2 puffs into the lungs daily.   MONTELUKAST (SINGULAIR) 10 MG TABLET    Take 1 tablet (10 mg total) by mouth daily.   NITROFURANTOIN, MACROCRYSTAL-MONOHYDRATE, (MACROBID) 100 MG CAPSULE    Take 1 capsule (100 mg total) by mouth 2 (two) times daily.   OMEGA-3 FATTY ACIDS PO    Take 1 tablet by mouth daily.    OMEPRAZOLE 20 MG TBEC    Take 20 mg by mouth.   PRAVASTATIN (PRAVACHOL) 40 MG TABLET    Take 1 tablet (40 mg total) by mouth daily.   SITAGLIPTIN (JANUVIA) 100 MG TABLET    Take 1 tablet (100 mg total) by mouth daily. Sample given #28  Lot # [id number] Exp: 03/2017   SPIRONOLACTONE (ALDACTONE) 25 MG TABLET    Take 1 tablet (25 mg total) by mouth once.    TAMOXIFEN (NOLVADEX) 10 MG TABLET    Take 10 mg by mouth daily.     Patient Care Team: Margarita Rana, MD as PCP - General (Family Medicine)     Objective:   Vitals: BP 110/64 mmHg  Pulse 80  Temp(Src) 97.6 F (36.4 C) (Oral)  Resp 20  Ht 5' (1.524 m)  Wt 229 lb (103.874 kg)  BMI 44.72 kg/m2   Physical Exam  Constitutional: She is oriented to  person, place, and time. She appears well-developed and well-nourished.  HENT:  Head: Normocephalic and atraumatic.  Right Ear: Tympanic membrane, external ear and ear canal normal.  Left Ear: Tympanic membrane, external ear and ear canal normal.  Nose: Nose normal.  Mouth/Throat: Uvula is midline, oropharynx is clear and moist and mucous membranes are normal.  Eyes: Conjunctivae, EOM and lids are normal. Pupils are equal, round, and reactive to light.  Neck: Trachea normal and normal range of motion. Neck supple. Carotid bruit is not present. No thyroid mass and no thyromegaly present.  Cardiovascular: Normal rate, regular rhythm and normal heart sounds.   Pulmonary/Chest: Effort normal and breath sounds normal.  Abdominal: Soft. Normal appearance and bowel sounds are normal. There is no hepatosplenomegaly. There is no tenderness.  Musculoskeletal: Normal range of motion.  Lymphadenopathy:    She has no cervical adenopathy.    She has no axillary adenopathy.  Neurological: She is alert and oriented to person, place, and time. She has normal strength. No cranial nerve deficit.  Skin: Skin is warm, dry and intact.  Psychiatric: She has a normal mood and affect. Her speech is normal and behavior is normal. Judgment and thought content normal. Cognition and memory are normal.     Depression Screen PHQ 2/9 Scores 08/06/2015  PHQ - 2 Score 0  PHQ- 9 Score 0      Assessment & Plan:     Routine Health Maintenance and Physical Exam  Exercise Activities and Dietary recommendations Goals    None          1. Annual physical  exam Stable. Patient advised to continue eating healthy and exercise daily.  2. Need for pneumococcal vaccination - Pneumococcal polysaccharide vaccine 23-valent greater than or equal to 2yo subcutaneous/IM    .Patient seen and examined by Dr. Jerrell Belfast, and note scribed by Philbert Riser. Dimas, CMA.  I have reviewed the document for accuracy and completeness and I agree with above. Jerrell Belfast, MD   Margarita Rana, MD    --------------------------------------------------------------------

## 2015-09-03 DIAGNOSIS — M1711 Unilateral primary osteoarthritis, right knee: Secondary | ICD-10-CM | POA: Diagnosis not present

## 2015-09-03 DIAGNOSIS — E1140 Type 2 diabetes mellitus with diabetic neuropathy, unspecified: Secondary | ICD-10-CM | POA: Diagnosis not present

## 2015-09-03 DIAGNOSIS — Z7189 Other specified counseling: Secondary | ICD-10-CM | POA: Diagnosis not present

## 2015-09-03 DIAGNOSIS — E1165 Type 2 diabetes mellitus with hyperglycemia: Secondary | ICD-10-CM | POA: Diagnosis not present

## 2015-09-03 DIAGNOSIS — I10 Essential (primary) hypertension: Secondary | ICD-10-CM | POA: Diagnosis not present

## 2015-09-03 DIAGNOSIS — J45909 Unspecified asthma, uncomplicated: Secondary | ICD-10-CM | POA: Insufficient documentation

## 2015-09-18 DIAGNOSIS — M1711 Unilateral primary osteoarthritis, right knee: Secondary | ICD-10-CM | POA: Diagnosis not present

## 2015-09-26 DIAGNOSIS — M1711 Unilateral primary osteoarthritis, right knee: Secondary | ICD-10-CM | POA: Diagnosis not present

## 2015-10-02 ENCOUNTER — Other Ambulatory Visit: Payer: Self-pay

## 2015-10-02 DIAGNOSIS — E1142 Type 2 diabetes mellitus with diabetic polyneuropathy: Secondary | ICD-10-CM

## 2015-10-02 MED ORDER — ACCU-CHEK FASTCLIX LANCETS MISC: Status: DC

## 2015-10-03 DIAGNOSIS — M1711 Unilateral primary osteoarthritis, right knee: Secondary | ICD-10-CM | POA: Diagnosis not present

## 2015-10-19 ENCOUNTER — Other Ambulatory Visit: Payer: Self-pay | Admitting: Family Medicine

## 2015-10-19 DIAGNOSIS — J40 Bronchitis, not specified as acute or chronic: Secondary | ICD-10-CM

## 2015-10-19 MED ORDER — MONTELUKAST SODIUM 10 MG PO TABS: 10.0000 mg | ORAL_TABLET | Freq: Every day | ORAL | Status: DC

## 2015-10-19 NOTE — Telephone Encounter (Signed)
Done-aa 

## 2015-10-19 NOTE — Telephone Encounter (Signed)
Pt contacted office for refill request on the following medications:  montelukast (SINGULAIR) 10 MG tablet.  [place]  [id number]

## 2015-10-29 ENCOUNTER — Emergency Department
Admission: EM | Admit: 2015-10-29 | Discharge: 2015-10-29 | Disposition: A | Discharge status: Home / Self Care | Payer: Commercial Managed Care - HMO | Attending: Emergency Medicine | Admitting: Emergency Medicine

## 2015-10-29 ENCOUNTER — Encounter: Payer: Self-pay | Admitting: Emergency Medicine

## 2015-10-29 DIAGNOSIS — M179 Osteoarthritis of knee, unspecified: Secondary | ICD-10-CM | POA: Insufficient documentation

## 2015-10-29 DIAGNOSIS — E1140 Type 2 diabetes mellitus with diabetic neuropathy, unspecified: Secondary | ICD-10-CM | POA: Insufficient documentation

## 2015-10-29 DIAGNOSIS — Z853 Personal history of malignant neoplasm of breast: Secondary | ICD-10-CM | POA: Insufficient documentation

## 2015-10-29 DIAGNOSIS — E119 Type 2 diabetes mellitus without complications: Secondary | ICD-10-CM | POA: Insufficient documentation

## 2015-10-29 DIAGNOSIS — M792 Neuralgia and neuritis, unspecified: Secondary | ICD-10-CM

## 2015-10-29 DIAGNOSIS — Z79899 Other long term (current) drug therapy: Secondary | ICD-10-CM | POA: Insufficient documentation

## 2015-10-29 DIAGNOSIS — G629 Polyneuropathy, unspecified: Secondary | ICD-10-CM | POA: Insufficient documentation

## 2015-10-29 DIAGNOSIS — Z7984 Long term (current) use of oral hypoglycemic drugs: Secondary | ICD-10-CM | POA: Insufficient documentation

## 2015-10-29 DIAGNOSIS — J45909 Unspecified asthma, uncomplicated: Secondary | ICD-10-CM | POA: Insufficient documentation

## 2015-10-29 DIAGNOSIS — M810 Age-related osteoporosis without current pathological fracture: Secondary | ICD-10-CM | POA: Insufficient documentation

## 2015-10-29 DIAGNOSIS — I10 Essential (primary) hypertension: Secondary | ICD-10-CM | POA: Insufficient documentation

## 2015-10-29 DIAGNOSIS — E785 Hyperlipidemia, unspecified: Secondary | ICD-10-CM | POA: Insufficient documentation

## 2015-10-29 LAB — COMPREHENSIVE METABOLIC PANEL
ALT: 59 U/L — ABNORMAL HIGH (ref 14–54)
AST: 28 U/L (ref 15–41)
Albumin: 3.7 g/dL (ref 3.5–5.0)
Alkaline Phosphatase: 48 U/L (ref 38–126)
Anion gap: 8 (ref 5–15)
BUN: 13 mg/dL (ref 6–20)
CO2: 25 mmol/L (ref 22–32)
Calcium: 9 mg/dL (ref 8.9–10.3)
Chloride: 104 mmol/L (ref 101–111)
Creatinine, Ser: 0.64 mg/dL (ref 0.44–1.00)
GFR calc Af Amer: >60 mL/min (ref >60)
GFR calc non Af Amer: >60 mL/min (ref >60)
Glucose, Bld: 135 mg/dL — ABNORMAL HIGH (ref 65–99)
Potassium: 3.6 mmol/L (ref 3.5–5.1)
Sodium: 137 mmol/L (ref 135–145)
Total Bilirubin: 0.4 mg/dL (ref 0.3–1.2)
Total Protein: 7.6 g/dL (ref 6.5–8.1)

## 2015-10-29 LAB — CBC WITH DIFFERENTIAL/PLATELET
Basophils Absolute: 0.1 10*3/uL (ref 0–0.1)
Basophils Relative: 1 %
Eosinophils Absolute: 0.1 10*3/uL (ref 0–0.7)
Eosinophils Relative: 1 %
HCT: 36.1 % (ref 35.0–47.0)
Hemoglobin: 11.9 g/dL — ABNORMAL LOW (ref 12.0–16.0)
Lymphocytes Relative: 33 %
Lymphs Abs: 2.6 10*3/uL (ref 1.0–3.6)
MCH: 28.5 pg (ref 26.0–34.0)
MCHC: 33.1 g/dL (ref 32.0–36.0)
MCV: 86.2 fL (ref 80.0–100.0)
Monocytes Absolute: 0.4 10*3/uL (ref 0.2–0.9)
Monocytes Relative: 5 %
Neutro Abs: 4.7 10*3/uL (ref 1.4–6.5)
Neutrophils Relative %: 60 %
Platelets: 228 10*3/uL (ref 150–440)
RBC: 4.18 MIL/uL (ref 3.80–5.20)
RDW: 13.8 % (ref 11.5–14.5)
WBC: 7.8 10*3/uL (ref 3.6–11.0)

## 2015-10-29 LAB — URINALYSIS COMPLETE WITH MICROSCOPIC (ARMC ONLY)
Bacteria, UA: NONE SEEN
Bilirubin Urine: NEGATIVE
Glucose, UA: NEGATIVE mg/dL
Hgb urine dipstick: NEGATIVE
Ketones, ur: NEGATIVE mg/dL
Leukocytes, UA: NEGATIVE
Nitrite: NEGATIVE
Protein, ur: NEGATIVE mg/dL
RBC / HPF: NONE SEEN RBC/hpf (ref 0–5)
Specific Gravity, Urine: 1.016 (ref 1.005–1.030)
pH: 5 (ref 5.0–8.0)

## 2015-10-29 MED ORDER — TRAMADOL HCL 50 MG PO TABS: 50.0000 mg | ORAL_TABLET | Freq: Four times a day (QID) | ORAL | Status: DC | PRN

## 2015-10-29 NOTE — ED Notes (Signed)
Pt discharged home after verbalizing understanding of discharge instructions; nad noted. 

## 2015-10-29 NOTE — Discharge Instructions (Signed)
Neuropathic Pain Neuropathic pain is pain caused by damage to the nerves that are responsible for certain sensations in your body (sensory nerves). The pain can be caused by damage to:   The sensory nerves that send signals to your spinal cord and brain (peripheral nervous system).  The sensory nerves in your brain or spinal cord (central nervous system). Neuropathic pain can make you more sensitive to pain. What would be a minor sensation for most people may feel very painful if you have neuropathic pain. This is usually a long-term condition that can be difficult to treat. The type of pain can differ from person to person. It may start suddenly (acute), or it may develop slowly and last for a long time (chronic). Neuropathic pain may come and go as damaged nerves heal or may stay at the same level for years. It often causes emotional distress, loss of sleep, and a lower quality of life. CAUSES  The most common cause of damage to a sensory nerve is diabetes. Many other diseases and conditions can also cause neuropathic pain. Causes of neuropathic pain can be classified as:  Toxic. Many drugs and chemicals can cause toxic damage. The most common cause of toxic neuropathic pain is damage from drug treatment for cancer (chemotherapy).  Metabolic. This type of pain can happen when a disease causes imbalances that damage nerves. Diabetes is the most common of these diseases. Vitamin B deficiency caused by long-term alcohol abuse is another common cause.  Traumatic. Any injury that cuts, crushes, or stretches a nerve can cause damage and pain. A common example is feeling pain after losing an arm or leg (phantom limb pain).  Compression-related. If a sensory nerve gets trapped or compressed for a long period of time, the blood supply to the nerve can be cut off.  Vascular. Many blood vessel diseases can cause neuropathic pain by decreasing blood supply and oxygen to nerves.  Autoimmune. This type of  pain results from diseases in which the body's defense system mistakenly attacks sensory nerves. Examples of autoimmune diseases that can cause neuropathic pain include lupus and multiple sclerosis.  Infectious. Many types of viral infections can damage sensory nerves and cause pain. Shingles infection is a common cause of this type of pain.  Inherited. Neuropathic pain can be a symptom of many diseases that are passed down through families (genetic). SIGNS AND SYMPTOMS  The main symptom is pain. Neuropathic pain is often described as:  Burning.  Shock-like.  Stinging.  Hot or cold.  Itching. DIAGNOSIS  No single test can diagnose neuropathic pain. Your health care provider will do a physical exam and ask you about your pain. You may use a pain scale to describe how bad your pain is. You may also have tests to see if you have a high sensitivity to pain and to help find the cause and location of any sensory nerve damage. These tests may include:  Imaging studies, such as:  X-rays.  CT scan.  MRI.  Nerve conduction studies to test how well nerve signals travel through your sensory nerves (electrodiagnostic testing).  Stimulating your sensory nerves through electrodes on your skin and measuring the response in your spinal cord and brain (somatosensory evoked potentials). TREATMENT  Treatment for neuropathic pain may change over time. You may need to try different treatment options or a combination of treatments. Some options include:  Over-the-counter pain relievers.  Prescription medicines. Some medicines used to treat other conditions may also help neuropathic pain. These   include medicines to:  Control seizures (anticonvulsants).  Relieve depression (antidepressants).  Prescription-strength pain relievers (narcotics). These are usually used when other pain relievers do not help.  Transcutaneous nerve stimulation (TENS). This uses electrical currents to block painful nerve  signals. The treatment is painless.  Topical and local anesthetics. These are medicines that numb the nerves. They can be injected as a nerve block or applied to the skin.  Alternative treatments, such as:  Acupuncture.  Meditation.  Massage.  Physical therapy.  Pain management programs.  Counseling. HOME CARE INSTRUCTIONS  Learn as much as you can about your condition.  Take medicines only as directed by your health care provider.  Work closely with all your health care providers to find what works best for you.  Have a good support system at home.  Consider joining a chronic pain support group. SEEK MEDICAL CARE IF:  Your pain treatments are not helping.  You are having side effects from your medicines.  You are struggling with fatigue, mood changes, depression, or anxiety.   This information is not intended to replace advice given to you by your health care provider. Make sure you discuss any questions you have with your health care provider.   Document Released: 02/21/2004 Document Revised: 06/16/2014 Document Reviewed: 11/03/2013 Elsevier Interactive Patient Education 2016 Elsevier Inc.  

## 2015-10-29 NOTE — ED Provider Notes (Signed)
[facility] Emergency Department Provider Note  Time seen: 4:10 PM  I have reviewed the triage vital signs and the nursing notes.   HISTORY  Chief Complaint Leg Pain and Dysuria    HPI Alexis Lloyd is a 55 y.o. female with a past medical history of diabetes, hypertension, gastric reflux, hyperlipidemia who presents to the emergency department with increased foot pain, and lower back pain. According to the patientfor the past 3 or 4 days she has been having increased bilateral foot numbness with shooting pains up her legs. States the pain is worse at night. She has been diagnosed with peripheral neuropathy and is currently taking meloxicam. Patient states she does not take gabapentin or Lyrica. Patient states she is on many chronic medications, and her physician is trying to limit the medications that she is actively taking. Patient also states she has been having some urinary frequency over the past 3 days, but denies any dysuria. Patient states a foul odor at times to her urine. Describes the numbness and pain in her feet is more of a burning sensation, mild to moderate constantly with occasional sharp more severe pains.     Past Medical History  Diagnosis Date  . Diabetes mellitus without complication ([place])   . Hypertension   . GERD (gastroesophageal reflux disease)   . Sleep apnea   . Allergy   . Hyperlipidemia   . Osteoporosis   . Breast cancer ([place])   . Breast cancer, left breast ([place]) 10/04/2014    Patient Active Problem List   Diagnosis Date Noted  . Foot pain 07/16/2015  . Diabetes ([place]) 07/13/2015  . Abnormal finding on mammography, microcalcification 04/25/2015  . Intraductal carcinoma of breast 04/24/2015  . Asthmatic bronchitis 01/19/2015  . Bronchitis 01/19/2015  . Abnormal breast finding 11/18/2014  . Airway hyperreactivity 11/18/2014  . Body mass index of 60 or higher ([place]) 11/18/2014  . Breast cancer, female ([place]) 11/18/2014  .  Breast cyst 11/18/2014  . Diabetic neuropathy ([place]) 11/18/2014  . Acid reflux 11/18/2014  . 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 Hypercholesteremia 11/18/2014  . BP (high blood pressure) 11/18/2014  . Adiposity 11/18/2014  . Arthritis, degenerative 11/18/2014  . Allergic rhinitis, seasonal 11/18/2014  . Apnea, sleep 11/18/2014  . AC (acromioclavicular) arthritis 11/08/2014  . Breast cancer, left breast ([place]) 10/04/2014  . Arthritis of knee, degenerative 11/14/2013    Past Surgical History  Procedure Laterality Date  . Breast lumpectomy Left 06/17/2013  . Breast reduction surgery Bilateral   . Cesarean section      x's 2  . Cholecystectomy  09/2001  . Abdominal hysterectomy      due to fibroid tumors 1990 Dr. Quenten Raven  . Reduction mammaplasty    . Breast cyst aspiration Left   . Breast biopsy Left     negative  . Breast biopsy Left     04/2013 positive  . Breast excisional biopsy Left     2015  . S/p radiation therapy Left     2015    Current Outpatient Rx  Name  Route  Sig  Dispense  Refill  . ACCU-CHEK FASTCLIX LANCETS MISC      To check blood sugars daily.  DX: E11.9   102 each   3   . albuterol (PROVENTIL HFA) 108 (90 Base) MCG/ACT inhaler   Inhalation   Inhale 2 puffs into the lungs every 6 (six) hours as needed.   1 Inhaler   5   . amLODipine (  NORVASC) 5 MG tablet   Oral   Take 5 mg by mouth daily.          . Blood Glucose Monitoring Suppl (ACCU-CHEK AVIVA PLUS) w/Device KIT      USE  DAILY   1 kit   0   . cetirizine (ZYRTEC) 10 MG tablet   Oral   Take 1 tablet (10 mg total) by mouth daily.   90 tablet   1   . Cholecalciferol (D-5000) 5000 UNITS TABS   Oral   Take 1 tablet by mouth daily.          Marland Kitchen lisinopril-hydrochlorothiazide (PRINZIDE,ZESTORETIC) 20-12.5 MG tablet   Oral   Take 1 tablet by mouth daily. Patient taking differently: Take 2 tablets by mouth daily.    90 tablet   1   . meloxicam (MOBIC) 15 MG tablet   Oral   Take 1  tablet (15 mg total) by mouth daily.   90 tablet   2   . metFORMIN (GLUCOPHAGE) 1000 MG tablet   Oral   Take 1,000 mg by mouth daily with breakfast.          . mometasone-formoterol (DULERA) 100-5 MCG/ACT AERO   Inhalation   Inhale 2 puffs into the lungs daily. Patient taking differently: Inhale 2 puffs into the lungs as needed.    13 g   3   . montelukast (SINGULAIR) 10 MG tablet   Oral   Take 1 tablet (10 mg total) by mouth daily.   90 tablet   3   . nitrofurantoin, macrocrystal-monohydrate, (MACROBID) 100 MG capsule   Oral   Take 1 capsule (100 mg total) by mouth 2 (two) times daily.   14 capsule   0   . OMEGA-3 FATTY ACIDS PO   Oral   Take 1 tablet by mouth daily.          . Omeprazole 20 MG TBEC   Oral   Take 20 mg by mouth.         . pravastatin (PRAVACHOL) 40 MG tablet   Oral   Take 1 tablet (40 mg total) by mouth daily.   90 tablet   1   . sitaGLIPtin (JANUVIA) 100 MG tablet   Oral   Take 1 tablet (100 mg total) by mouth daily. Sample given #28  Lot # [id number] Exp: 03/2017   28 tablet   0   . spironolactone (ALDACTONE) 25 MG tablet   Oral   Take 1 tablet (25 mg total) by mouth once.   90 tablet   1   . tamoxifen (NOLVADEX) 10 MG tablet   Oral   Take 10 mg by mouth daily.            Allergies Allegra; Avelox; Bactrim; Etodolac; Penicillins; and Sulfa antibiotics  Family History  Problem Relation Age of Onset  . Hypertension Mother   . Cancer Mother     breast and rectal cancer  . Breast cancer Mother 30  . Asthma Brother   . Hypertension Brother   . Allergic rhinitis Brother   . Breast cancer Maternal Grandmother     Social History Social History  Substance Use Topics  . Smoking status: Never Smoker   . Smokeless tobacco: Never Used  . Alcohol Use: No    Review of Systems Constitutional: Negative for fever. Cardiovascular: Negative for chest pain. Respiratory: Negative for shortness of breath. Gastrointestinal:  Negative for abdominal pain Genitourinary: Negative for dysuria.Positive for urinary frequency  and foul odor. Musculoskeletal: Lower back pain, with occasional radiation to the right leg. Skin: Negative for rash. Neurological: Negative for headaches, focal weakness 10-point ROS otherwise negative.  ____________________________________________   PHYSICAL EXAM:  VITAL SIGNS: ED Triage Vitals  Enc Vitals Group     BP 10/29/15 1314 122/100 mmHg     Pulse Rate 10/29/15 1314 79     Resp 10/29/15 1314 18     Temp 10/29/15 1314 98.1 F (36.7 C)     Temp Source 10/29/15 1314 Oral     SpO2 10/29/15 1314 100 %     Weight 10/29/15 1314 220 lb (99.791 kg)     Height 10/29/15 1314 4' 11"  (1.499 m)     Head Cir --      Peak Flow --      Pain Score 10/29/15 1315 8     Pain Loc --      Pain Edu? --      Excl. in [place]? --     Constitutional: Alert and oriented. Well appearing and in no distress. Eyes: Normal exam ENT   Head: Normocephalic and atraumatic.   Mouth/Throat: Mucous membranes are moist. Cardiovascular: Normal rate, regular rhythm. No murmur Respiratory: Normal respiratory effort without tachypnea nor retractions. Breath sounds are clear  Gastrointestinal: Soft and nontender. No distention.   Musculoskeletal: Nontender with normal range of motion in all extremities. No leg swelling or calf tenderness. 2+ DP pulses bilaterally. Mild tenderness palpation of the bilateral bottom of her feet and toes. No deformity or erythema. Neurologic:  Normal speech and language. No gross focal neurologic deficits  Skin:  Skin is warm, dry and intact.  Psychiatric: Mood and affect are normal. Speech and behavior are normal.   ____________________________________________   INITIAL IMPRESSION / ASSESSMENT AND PLAN / ED COURSE  Pertinent labs & imaging results that were available during my care of the patient were reviewed by me and considered in my medical decision making (see chart for  details).  Patient presents for symptoms most suggestive of peripheral neuropathy. Patient's labs are within normal limits. Urinalysis negative. Overall the patient appears well. I discussed placing the patient on a short course of Ultram instead of her meloxicam, and having her follow up with her primary care doctor this week for reevaluation. The patient is agreeable to this plan.  ____________________________________________   FINAL CLINICAL IMPRESSION(S) / ED DIAGNOSES  Peripheral neuropathy Neuropathic pain   Harvest Dark, MD 10/29/15 1614

## 2015-10-29 NOTE — ED Notes (Signed)
States has had urinary frequency x 3 days. Also states history of bilateral foot numbness and leg cramping x 3 days, especially at night.

## 2015-10-29 NOTE — ED Notes (Signed)
Pt presents with foot pain that shoots up her left leg since Thursday. States that she has had foot pain "for a while" but that it got intense on Thursday. Pt is diabetic and states that she is being treated with meloxicam for neuropathy. Pt c/o cramps in both legs and right hip. Pt states her urine has bad odor to it and she has lower back pain. NAD noted.

## 2015-11-13 ENCOUNTER — Telehealth: Payer: Self-pay | Admitting: Family Medicine

## 2015-11-13 DIAGNOSIS — J40 Bronchitis, not specified as acute or chronic: Secondary | ICD-10-CM

## 2015-11-13 DIAGNOSIS — I10 Essential (primary) hypertension: Secondary | ICD-10-CM

## 2015-11-13 DIAGNOSIS — J45909 Unspecified asthma, uncomplicated: Secondary | ICD-10-CM

## 2015-11-13 DIAGNOSIS — C50912 Malignant neoplasm of unspecified site of left female breast: Secondary | ICD-10-CM

## 2015-11-13 DIAGNOSIS — E7800 Pure hypercholesterolemia, unspecified: Secondary | ICD-10-CM

## 2015-11-13 DIAGNOSIS — M1990 Unspecified osteoarthritis, unspecified site: Secondary | ICD-10-CM

## 2015-11-13 DIAGNOSIS — E1142 Type 2 diabetes mellitus with diabetic polyneuropathy: Secondary | ICD-10-CM

## 2015-11-13 NOTE — Telephone Encounter (Signed)
Pt called to get an update on the forms she dropped off Friday 11/09/15 to get help with the cost of her medications. I advised pt that paperwork can take 10 to 14 days. Pt would like an update to see when it might be available b/c she stated she needs her medications. Please advise. Thanks TNP

## 2015-11-13 NOTE — Telephone Encounter (Signed)
lmtcb to ask which medications need to be documented under which pharmaceutical form. Renaldo Fiddler, CMA

## 2015-11-15 MED ORDER — TAMOXIFEN CITRATE 10 MG PO TABS: 10.0000 mg | ORAL_TABLET | Freq: Every day | ORAL | Status: DC

## 2015-11-15 MED ORDER — SITAGLIPTIN PHOSPHATE 100 MG PO TABS: 100.0000 mg | ORAL_TABLET | Freq: Every day | ORAL | Status: DC

## 2015-11-15 MED ORDER — MOMETASONE FURO-FORMOTEROL FUM 100-5 MCG/ACT IN AERO: 2.0000 | INHALATION_SPRAY | Freq: Every day | RESPIRATORY_TRACT | Status: DC

## 2015-11-15 MED ORDER — PRAVASTATIN SODIUM 40 MG PO TABS: 40.0000 mg | ORAL_TABLET | Freq: Every day | ORAL | Status: DC

## 2015-11-15 MED ORDER — ALBUTEROL SULFATE HFA 108 (90 BASE) MCG/ACT IN AERS: 2.0000 | INHALATION_SPRAY | Freq: Four times a day (QID) | RESPIRATORY_TRACT | Status: DC | PRN

## 2015-11-15 MED ORDER — AMLODIPINE BESYLATE 5 MG PO TABS: 5.0000 mg | ORAL_TABLET | Freq: Every day | ORAL | Status: DC

## 2015-11-15 MED ORDER — LISINOPRIL-HYDROCHLOROTHIAZIDE 20-12.5 MG PO TABS: 1.0000 | ORAL_TABLET | Freq: Every day | ORAL | Status: DC

## 2015-11-15 MED ORDER — MELOXICAM 15 MG PO TABS: 15.0000 mg | ORAL_TABLET | Freq: Every day | ORAL | Status: DC

## 2015-11-15 MED ORDER — MONTELUKAST SODIUM 10 MG PO TABS: 10.0000 mg | ORAL_TABLET | Freq: Every day | ORAL | Status: DC

## 2015-11-15 MED ORDER — SPIRONOLACTONE 25 MG PO TABS: 25.0000 mg | ORAL_TABLET | Freq: Once | ORAL | Status: DC

## 2015-11-15 NOTE — Telephone Encounter (Signed)
Pt is returning call.  RS:[id number]

## 2015-11-16 ENCOUNTER — Ambulatory Visit (INDEPENDENT_AMBULATORY_CARE_PROVIDER_SITE_OTHER): Payer: Medicare Other | Admitting: Physician Assistant

## 2015-11-16 ENCOUNTER — Encounter: Payer: Self-pay | Admitting: Physician Assistant

## 2015-11-16 VITALS — BP 124/60 | HR 72 | Temp 98.3°F | Resp 16 | Wt 222.0 lb

## 2015-11-16 DIAGNOSIS — J40 Bronchitis, not specified as acute or chronic: Secondary | ICD-10-CM | POA: Diagnosis not present

## 2015-11-16 DIAGNOSIS — B379 Candidiasis, unspecified: Secondary | ICD-10-CM | POA: Diagnosis not present

## 2015-11-16 DIAGNOSIS — R8290 Unspecified abnormal findings in urine: Secondary | ICD-10-CM

## 2015-11-16 DIAGNOSIS — K219 Gastro-esophageal reflux disease without esophagitis: Secondary | ICD-10-CM

## 2015-11-16 DIAGNOSIS — N3090 Cystitis, unspecified without hematuria: Secondary | ICD-10-CM | POA: Diagnosis not present

## 2015-11-16 DIAGNOSIS — E1142 Type 2 diabetes mellitus with diabetic polyneuropathy: Secondary | ICD-10-CM

## 2015-11-16 LAB — POCT URINALYSIS DIPSTICK
Bilirubin, UA: NEGATIVE
Blood, UA: NEGATIVE
Glucose, UA: NEGATIVE
Ketones, UA: NEGATIVE
Leukocytes, UA: NEGATIVE
Nitrite, UA: NEGATIVE
Spec Grav, UA: 1.02
Urobilinogen, UA: 0.2
pH, UA: 5

## 2015-11-16 LAB — POCT GLYCOSYLATED HEMOGLOBIN (HGB A1C)
Est. average glucose Bld gHb Est-mCnc: 180
Hemoglobin A1C: 7.9

## 2015-11-16 MED ORDER — OMEPRAZOLE 20 MG PO TBEC: 20.0000 mg | DELAYED_RELEASE_TABLET | Freq: Every day | ORAL | Status: DC

## 2015-11-16 MED ORDER — FLUCONAZOLE 150 MG PO TABS: 150.0000 mg | ORAL_TABLET | Freq: Once | ORAL | Status: DC

## 2015-11-16 MED ORDER — NITROFURANTOIN MONOHYD MACRO 100 MG PO CAPS: 100.0000 mg | ORAL_CAPSULE | Freq: Two times a day (BID) | ORAL | Status: DC

## 2015-11-16 NOTE — Telephone Encounter (Signed)
Pt returned called .  Pt said to please leave a voice mail if she does not pick up.  Thanks, [employer]

## 2015-11-16 NOTE — Progress Notes (Signed)
Patient: Alexis Lloyd Female    DOB: 1961-01-26   55 y.o.   MRN: [id number] Visit Date: 11/16/2015  Today's Provider: Mar Daring, PA-C   Chief Complaint  Patient presents with  . Back Pain  . Vaginal Itching   Subjective:    Back Pain This is a new problem. The current episode started 1 to 4 weeks ago. The problem occurs every several days. Quality: All over lower back. Radiates to: When she needs to urinate the back pain radiates to her right leg. The pain is at a severity of 7/10. The pain is moderate. The pain is worse during the night. The symptoms are aggravated by position. Associated symptoms include leg pain. Pertinent negatives include no abdominal pain, chest pain, dysuria, fever, headaches, numbness or pelvic pain. She has tried nothing for the symptoms.  Vaginal Itching The patient's primary symptoms include a genital odor (smells is stronger in the morning thinks is from her urine). The patient's pertinent negatives include no pelvic pain or vaginal discharge. This is a new problem. The current episode started today. The problem occurs constantly. The problem has been unchanged. The patient is experiencing no pain. She is not pregnant. Associated symptoms include back pain, discolored urine (darker color), frequency and urgency. Pertinent negatives include no abdominal pain, constipation, diarrhea, dysuria, fever, flank pain, headaches, hematuria, nausea, painful intercourse or vomiting. The symptoms are aggravated by urinating. She has tried nothing for the symptoms. She is sexually active. No, her partner does not have an STD. She uses hysterectomy for contraception.   Back pain is chronic in nature and is not bothering her today. She does have meloxicam that she uses when it is bad and it relieves the pain.  Patient is currently on Januvia and metformin for her T2DM. She has been on Januvia for 1 year. She has noticed increased urinary discomfort and yeast  infections since being on the medication. States she may get one every 3-4 months or longer in between.    Allergies  Allergen Reactions  . Allegra [Fexofenadine] Nausea And Vomiting  . Avelox [Moxifloxacin] Other (See Comments)    Hallucinations  . Bactrim [Sulfamethoxazole-Trimethoprim] Other (See Comments)    unknown  . Etodolac Nausea Only  . Penicillins Diarrhea and Nausea And Vomiting  . Sulfa Antibiotics Other (See Comments)    unknown   Current Meds  Medication Sig  . ACCU-CHEK FASTCLIX LANCETS MISC To check blood sugars daily.  DX: E11.9  . albuterol (PROVENTIL HFA) 108 (90 Base) MCG/ACT inhaler Inhale 2 puffs into the lungs every 6 (six) hours as needed.  Marland Kitchen amLODipine (NORVASC) 5 MG tablet Take 1 tablet (5 mg total) by mouth daily.  . Blood Glucose Monitoring Suppl (ACCU-CHEK AVIVA PLUS) w/Device KIT USE  DAILY  . cetirizine (ZYRTEC) 10 MG tablet Take 1 tablet (10 mg total) by mouth daily.  . Cholecalciferol (D-5000) 5000 UNITS TABS Take 1 tablet by mouth daily.   Marland Kitchen lisinopril-hydrochlorothiazide (PRINZIDE,ZESTORETIC) 20-12.5 MG tablet Take 1 tablet by mouth daily.  . meloxicam (MOBIC) 15 MG tablet Take 1 tablet (15 mg total) by mouth daily.  . metFORMIN (GLUCOPHAGE) 1000 MG tablet Take 1,000 mg by mouth daily with breakfast.   . montelukast (SINGULAIR) 10 MG tablet Take 1 tablet (10 mg total) by mouth daily.  . OMEGA-3 FATTY ACIDS PO Take 1 tablet by mouth daily.   . Omeprazole 20 MG TBEC Take 20 mg by mouth.  . pravastatin (PRAVACHOL)  40 MG tablet Take 1 tablet (40 mg total) by mouth daily.  . sitaGLIPtin (JANUVIA) 100 MG tablet Take 1 tablet (100 mg total) by mouth daily.  Marland Kitchen spironolactone (ALDACTONE) 25 MG tablet Take 1 tablet (25 mg total) by mouth once.  . tamoxifen (NOLVADEX) 10 MG tablet Take 1 tablet (10 mg total) by mouth daily.    Review of Systems  Constitutional: Negative for fever.  Cardiovascular: Negative for chest pain.  Gastrointestinal: Negative for  nausea, vomiting, abdominal pain, diarrhea and constipation.  Genitourinary: Positive for urgency and frequency. Negative for dysuria, hematuria, flank pain, vaginal bleeding, vaginal discharge, vaginal pain (itching) and pelvic pain.  Musculoskeletal: Positive for back pain and arthralgias (right knee).  Neurological: Negative for numbness and headaches.    Social History  Substance Use Topics  . Smoking status: Never Smoker   . Smokeless tobacco: Never Used  . Alcohol Use: No   Objective:   BP 124/60 mmHg  Pulse 72  Temp(Src) 98.3 F (36.8 C) (Oral)  Resp 16  Wt 222 lb (100.699 kg)  Physical Exam  Constitutional: She is oriented to person, place, and time. She appears well-developed and well-nourished. No distress.  Cardiovascular: Normal rate, regular rhythm and normal heart sounds.  Exam reveals no gallop and no friction rub.   No murmur heard. Pulmonary/Chest: Effort normal and breath sounds normal. No respiratory distress. She has no wheezes. She has no rales.  Abdominal: Soft. Normal appearance and bowel sounds are normal. She exhibits no distension and no mass. There is no hepatosplenomegaly. There is tenderness in the suprapubic area. There is no rebound, no guarding and no CVA tenderness.  Suprapubic pressure  Neurological: She is alert and oriented to person, place, and time.  Skin: Skin is warm and dry. She is not diaphoretic.      Assessment & Plan:     1. Cystitis Will give macrobid as below as this has worked well for her in the past. Will send urine for culture. Will adjust antibiotic therapy if indicated per C&S results. Stay well hydrated and call if symptoms worsen. - nitrofurantoin, macrocrystal-monohydrate, (MACROBID) 100 MG capsule; Take 1 capsule (100 mg total) by mouth 2 (two) times daily.  Dispense: 14 capsule; Refill: 0 - Urine Culture  2. Bad odor of urine See above. - POCT urinalysis dipstick - Urine Culture  3. Yeast infection Yeast infection  most likely secondary to Januvia. Will give diflucan as below. She is to call if symptoms do not improve or worsen. - fluconazole (DIFLUCAN) 150 MG tablet; Take 1 tablet (150 mg total) by mouth once.  Dispense: 1 tablet; Refill: 0  4. Gastroesophageal reflux disease, esophagitis presence not specified Stable. Diagnosis pulled for medication refill. Continue current medical treatment plan. - Omeprazole 20 MG TBEC; Take 1 tablet (20 mg total) by mouth daily.  Dispense: 90 each; Refill: 1  5. Type 2 diabetes mellitus with diabetic polyneuropathy, without long-term current use of insulin (HCC) HgBA1c is down to 7.9. Continue metformin and Januvia. Continue lifestyle modifications. RTC in 3 months for recheck. - POCT HgB A1C       Mar Daring, PA-C  [facility]

## 2015-11-16 NOTE — Patient Instructions (Signed)

## 2015-11-18 LAB — URINE CULTURE

## 2015-11-19 ENCOUNTER — Telehealth: Payer: Self-pay

## 2015-11-19 NOTE — Telephone Encounter (Signed)
LMTCB  Thanks,  -Quinnlyn Hearns 

## 2015-11-19 NOTE — Telephone Encounter (Signed)
-----   Message from Mar Daring, PA-C sent at 11/19/2015  9:39 AM EDT ----- Urine culture was positive for e.coli. It is susceptible to macrobid. Continue until completed.

## 2015-11-20 NOTE — Telephone Encounter (Signed)
Patient advised as directed below.  Thanks,  -Joseline 

## 2015-11-29 ENCOUNTER — Other Ambulatory Visit: Payer: Self-pay | Admitting: Family Medicine

## 2015-11-29 DIAGNOSIS — I10 Essential (primary) hypertension: Secondary | ICD-10-CM

## 2015-12-03 ENCOUNTER — Ambulatory Visit: Payer: Medicare Other | Admitting: Physician Assistant

## 2015-12-03 ENCOUNTER — Encounter: Payer: Self-pay | Admitting: Physician Assistant

## 2015-12-03 ENCOUNTER — Ambulatory Visit (INDEPENDENT_AMBULATORY_CARE_PROVIDER_SITE_OTHER): Payer: Medicare Other | Admitting: Physician Assistant

## 2015-12-03 VITALS — BP 150/80 | HR 75 | Temp 98.3°F | Resp 16 | Wt 226.8 lb

## 2015-12-03 DIAGNOSIS — E7800 Pure hypercholesterolemia, unspecified: Secondary | ICD-10-CM

## 2015-12-03 DIAGNOSIS — I10 Essential (primary) hypertension: Secondary | ICD-10-CM

## 2015-12-03 MED ORDER — LOVASTATIN 40 MG PO TABS: 40.0000 mg | ORAL_TABLET | Freq: Every day | ORAL | Status: DC

## 2015-12-03 MED ORDER — VERAPAMIL HCL 80 MG PO TABS: 80.0000 mg | ORAL_TABLET | Freq: Two times a day (BID) | ORAL | Status: DC

## 2015-12-03 NOTE — Progress Notes (Signed)
Patient: Alexis Lloyd Female    DOB: 1960-10-29   55 y.o.   MRN: [id number] Visit Date: 12/03/2015  Today's Provider: Mar Daring, PA-C   Chief Complaint  Patient presents with  . Medication Problem   Subjective:    HPI  Alexis Lloyd comes to the office today with concerns of cost of medication. Unfortunately last year she discontinued her [employer] part D plan, not realizing medications were not covered on medicare. She is on disability and fixed income. She applied for financial assistance from the pharmaceutical companies but they did not offer much support. She is requesting for whichever medicines that are not already on the [facility] $4 list be switched to a medicine that is.      Allergies  Allergen Reactions  . Allegra [Fexofenadine] Nausea And Vomiting  . Avelox [Moxifloxacin] Other (See Comments)    Hallucinations  . Bactrim [Sulfamethoxazole-Trimethoprim] Other (See Comments)    unknown  . Etodolac Nausea Only  . Penicillins Diarrhea and Nausea And Vomiting  . Sulfa Antibiotics Other (See Comments)    unknown   Current Meds  Medication Sig  . ACCU-CHEK FASTCLIX LANCETS MISC To check blood sugars daily.  DX: E11.9  . albuterol (PROVENTIL HFA) 108 (90 Base) MCG/ACT inhaler Inhale 2 puffs into the lungs every 6 (six) hours as needed.  Marland Kitchen amLODipine (NORVASC) 5 MG tablet TAKE ONE TABLET BY MOUTH ONCE DAILY  . Blood Glucose Monitoring Suppl (ACCU-CHEK AVIVA PLUS) w/Device KIT USE  DAILY  . cetirizine (ZYRTEC) 10 MG tablet Take 1 tablet (10 mg total) by mouth daily.  . Cholecalciferol (D-5000) 5000 UNITS TABS Take 1 tablet by mouth daily.   Marland Kitchen lisinopril-hydrochlorothiazide (PRINZIDE,ZESTORETIC) 20-12.5 MG tablet Take 1 tablet by mouth daily.  . meloxicam (MOBIC) 15 MG tablet Take 1 tablet (15 mg total) by mouth daily.  . metFORMIN (GLUCOPHAGE) 1000 MG tablet Take 1,000 mg by mouth daily with breakfast.   . montelukast (SINGULAIR) 10 MG tablet Take 1 tablet  (10 mg total) by mouth daily.  . OMEGA-3 FATTY ACIDS PO Take 1 tablet by mouth daily.   . Omeprazole 20 MG TBEC Take 1 tablet (20 mg total) by mouth daily.  . pravastatin (PRAVACHOL) 40 MG tablet Take 1 tablet (40 mg total) by mouth daily.  . sitaGLIPtin (JANUVIA) 100 MG tablet Take 1 tablet (100 mg total) by mouth daily.  Marland Kitchen spironolactone (ALDACTONE) 25 MG tablet Take 1 tablet (25 mg total) by mouth once.  . tamoxifen (NOLVADEX) 10 MG tablet Take 1 tablet (10 mg total) by mouth daily.    Review of Systems  Constitutional: Negative.   Respiratory: Negative.   Cardiovascular: Negative.   Gastrointestinal: Negative.   Neurological: Negative.   Psychiatric/Behavioral: Negative.     Social History  Substance Use Topics  . Smoking status: Never Smoker   . Smokeless tobacco: Never Used  . Alcohol Use: No   Objective:   BP 150/80 mmHg  Pulse 75  Temp(Src) 98.3 F (36.8 C) (Oral)  Resp 16  Wt 226 lb 12.8 oz (102.876 kg)  Physical Exam  Constitutional: She appears well-developed and well-nourished. No distress.  Cardiovascular: Normal rate, regular rhythm and normal heart sounds.  Exam reveals no gallop and no friction rub.   No murmur heard. Pulmonary/Chest: Effort normal and breath sounds normal. No respiratory distress. She has no wheezes. She has no rales.  Skin: She is not diaphoretic.  Vitals reviewed.     Assessment &  Plan:     1. Hypercholesteremia Medications were compared to [facility] $4 list and pravastatin is not on the list. Will switch to lovastain as below at same dose. She is to call if she has any adverse reaction to this medication. I will see her back in 3 months to recheck HTN, T2DM and HLD. - lovastatin (MEVACOR) 40 MG tablet; Take 1 tablet (40 mg total) by mouth at bedtime.  Dispense: 30 tablet; Refill: 3  2. Essential hypertension Amlodipine was not on the $4 so we will switch to verapamil 26m BID. She is to call if she has any adverse reaction to this  medication. She is also to check her BP and make sure it returns to normal readings. BP elevated today because she has not been able to fill her amlodipine. I will see her back in 3 months to recheck. - verapamil (CALAN) 80 MG tablet; Take 1 tablet (80 mg total) by mouth 2 (two) times daily.  Dispense: 60 tablet; Refill: 3Todd PA-C  [facility]

## 2015-12-03 NOTE — Patient Instructions (Signed)
Diabetes Mellitus and Food It is important for you to manage your blood sugar (glucose) level. Your blood glucose level can be greatly affected by what you eat. Eating healthier foods in the appropriate amounts throughout the day at about the same time each day will help you control your blood glucose level. It can also help slow or prevent worsening of your diabetes mellitus. Healthy eating may even help you improve the level of your blood pressure and reach or maintain a healthy weight.  General recommendations for healthful eating and cooking habits include:  Eating meals and snacks regularly. Avoid going long periods of time without eating to lose weight.  Eating a diet that consists mainly of plant-based foods, such as fruits, vegetables, nuts, legumes, and whole grains.  Using low-heat cooking methods, such as baking, instead of high-heat cooking methods, such as deep frying. Work with your dietitian to make sure you understand how to use the Nutrition Facts information on food labels. HOW CAN FOOD AFFECT ME? Carbohydrates Carbohydrates affect your blood glucose level more than any other type of food. Your dietitian will help you determine how many carbohydrates to eat at each meal and teach you how to count carbohydrates. Counting carbohydrates is important to keep your blood glucose at a healthy level, especially if you are using insulin or taking certain medicines for diabetes mellitus. Alcohol Alcohol can cause sudden decreases in blood glucose (hypoglycemia), especially if you use insulin or take certain medicines for diabetes mellitus. Hypoglycemia can be a life-threatening condition. Symptoms of hypoglycemia (sleepiness, dizziness, and disorientation) are similar to symptoms of having too much alcohol.  If your health care provider has given you approval to drink alcohol, do so in moderation and use the following guidelines:  Women should not have more than one drink per day, and men  should not have more than two drinks per day. One drink is equal to:  12 oz of beer.  5 oz of wine.  1 oz of hard liquor.  Do not drink on an empty stomach.  Keep yourself hydrated. Have water, diet soda, or unsweetened iced tea.  Regular soda, juice, and other mixers might contain a lot of carbohydrates and should be counted. WHAT FOODS ARE NOT RECOMMENDED? As you make food choices, it is important to remember that all foods are not the same. Some foods have fewer nutrients per serving than other foods, even though they might have the same number of calories or carbohydrates. It is difficult to get your body what it needs when you eat foods with fewer nutrients. Examples of foods that you should avoid that are high in calories and carbohydrates but low in nutrients include:  Trans fats (most processed foods list trans fats on the Nutrition Facts label).  Regular soda.  Juice.  Candy.  Sweets, such as cake, pie, doughnuts, and cookies.  Fried foods. WHAT FOODS CAN I EAT? Eat nutrient-rich foods, which will nourish your body and keep you healthy. The food you should eat also will depend on several factors, including:  The calories you need.  The medicines you take.  Your weight.  Your blood glucose level.  Your blood pressure level.  Your cholesterol level. You should eat a variety of foods, including:  Protein.  Lean cuts of meat.  Proteins low in saturated fats, such as fish, egg whites, and beans. Avoid processed meats.  Fruits and vegetables.  Fruits and vegetables that may help control blood glucose levels, such as apples, mangoes, and   yams.  Dairy products.  Choose fat-free or low-fat dairy products, such as milk, yogurt, and cheese.  Grains, bread, pasta, and rice.  Choose whole grain products, such as multigrain bread, whole oats, and brown rice. These foods may help control blood pressure.  Fats.  Foods containing healthful fats, such as nuts,  avocado, olive oil, canola oil, and fish. DOES EVERYONE WITH DIABETES MELLITUS HAVE THE SAME MEAL PLAN? Because every person with diabetes mellitus is different, there is not one meal plan that works for everyone. It is very important that you meet with a dietitian who will help you create a meal plan that is just right for you.   This information is not intended to replace advice given to you by your health care provider. Make sure you discuss any questions you have with your health care provider.   Document Released: 02/20/2005 Document Revised: 06/16/2014 Document Reviewed: 04/22/2013 Elsevier Interactive Patient Education 2016 [place].  Diet for Metabolic Syndrome Metabolic syndrome is a disorder that includes at least three of these conditions:  Abdominal obesity.  Too much sugar in your blood.  High blood pressure.  Higher than normal amount of fat (lipids) in your blood.  Lower than normal level of "good" cholesterol (HDL). Following a healthy diet can help to keep metabolic syndrome under control. It can also help to prevent the development of conditions that are associated with metabolic syndrome, such as diabetes, heart disease, and stroke. Along with exercise, a healthy diet:  Helps to improve the way that the body uses insulin.  Promotes weight loss. A common goal for people with this condition is to lose at least 7 to 10 percent of their starting weight. WHAT DO I NEED TO KNOW ABOUT THIS DIET?  Use the glycemic index (GI) to plan your meals. The index tells you how quickly a food will raise your blood sugar. Choose foods that have low GI values. These foods take a longer time to raise blood sugar.  Keep track of how many calories you take in. Eating the right amount of calories will help your achieve a healthy weight.  You may want to follow a Mediterranean diet. This diet includes lots of vegetables, lean meats or fish, whole grains, fruits, and healthy oils and  fats. WHAT FOODS CAN I EAT? Grains Stone-ground whole wheat. Pumpernickel bread. Whole-grain bread, crackers, tortillas, cereal, and pasta. Unsweetened oatmeal.Bulgur.Barley.Quinoa.Brown rice or wild rice. Vegetables Lettuce. Spinach. Peas. Beets. Cauliflower. Cabbage. Broccoli. Carrots. Tomatoes. Squash. Eggplant. Herbs. Peppers. Onions. Cucumbers. Brussels sprouts. Sweet potatoes. Yams. Beans. Lentils. Fruits Berries. Apples. Oranges. Grapes. Mango. Pomegranate. Kiwi. Cherries. Meats and Other Protein Sources Seafood and shellfish. Lean meats.Poultry. Tofu. Dairy Low-fat or fat-free dairy products, such as milk, yogurt, and cheese. Beverages Water. Low-fat milk. Milk alternatives, like soy milk or almond milk. Real fruit juice. Condiments Low-sugar or sugar-free ketchup, barbecue sauce, and mayonnaise. Mustard. Relish. Fats and Oils Avocado. Canola or olive oil. Nuts and nut butters.Seeds. The items listed above may not be a complete list of recommended foods or beverages. Contact your dietitian for more options.  WHAT FOODS ARE NOT RECOMMENDED? Red meat. Palm oil and coconut oil. Processed foods. Fried foods. Alcohol. Sweetened drinks, such as iced tea and soda. Sweets. Salty foods. The items listed above may not be a complete list of foods and beverages to avoid. Contact your dietitian for more information.   This information is not intended to replace advice given to you by your health care provider. Make  sure you discuss any questions you have with your health care provider.   Document Released: 10/10/2014 Document Reviewed: 10/10/2014 Elsevier Interactive Patient Education 2016 [place] Carbohydrate Counting for Diabetes Mellitus Carbohydrate counting is a method for keeping track of the amount of carbohydrates you eat. Eating carbohydrates naturally increases the level of sugar (glucose) in your blood, so it is important for you to know the amount that is okay  for you to have in every meal. Carbohydrate counting helps keep the level of glucose in your blood within normal limits. The amount of carbohydrates allowed is different for every person. A dietitian can help you calculate the amount that is right for you. Once you know the amount of carbohydrates you can have, you can count the carbohydrates in the foods you want to eat. Carbohydrates are found in the following foods:  Grains, such as breads and cereals.  Dried beans and soy products.  Starchy vegetables, such as potatoes, peas, and corn.  Fruit and fruit juices.  Milk and yogurt.  Sweets and snack foods, such as cake, cookies, candy, chips, soft drinks, and fruit drinks. CARBOHYDRATE COUNTING There are two ways to count the carbohydrates in your food. You can use either of the methods or a combination of both. Reading the "Nutrition Facts" on [place] The "Nutrition Facts" is an area that is included on the labels of almost all packaged food and beverages in the [place]. It includes the serving size of that food or beverage and information about the nutrients in each serving of the food, including the grams (g) of carbohydrate per serving.  Decide the number of servings of this food or beverage that you will be able to eat or drink. Multiply that number of servings by the number of grams of carbohydrate that is listed on the label for that serving. The total will be the amount of carbohydrates you will be having when you eat or drink this food or beverage. Learning Standard Serving Sizes of Food When you eat food that is not packaged or does not include "Nutrition Facts" on the label, you need to measure the servings in order to count the amount of carbohydrates.A serving of most carbohydrate-rich foods contains about 15 g of carbohydrates. The following list includes serving sizes of carbohydrate-rich foods that provide 15 g ofcarbohydrate per serving:   1 slice of bread (1 oz)  or 1 six-inch tortilla.    of a hamburger bun or English muffin.  4-6 crackers.   cup unsweetened dry cereal.    cup hot cereal.   cup rice or pasta.    cup mashed potatoes or  of a large baked potato.  1 cup fresh fruit or one small piece of fruit.    cup canned or frozen fruit or fruit juice.  1 cup milk.   cup plain fat-free yogurt or yogurt sweetened with artificial sweeteners.   cup cooked dried beans or starchy vegetable, such as peas, corn, or potatoes.  Decide the number of standard-size servings that you will eat. Multiply that number of servings by 15 (the grams of carbohydrates in that serving). For example, if you eat 2 cups of strawberries, you will have eaten 2 servings and 30 g of carbohydrates (2 servings x 15 g = 30 g). For foods such as soups and casseroles, in which more than one food is mixed in, you will need to count the carbohydrates in each food that is included. EXAMPLE OF CARBOHYDRATE  COUNTING Sample Dinner  3 oz chicken breast.   cup of brown rice.   cup of corn.  1 cup milk.   1 cup strawberries with sugar-free whipped topping.  Carbohydrate Calculation Step 1: Identify the foods that contain carbohydrates:   Rice.   Corn.   Milk.   Strawberries. Step 2:Calculate the number of servings eaten of each:   2 servings of rice.   1 serving of corn.   1 serving of milk.   1 serving of strawberries. Step 3: Multiply each of those number of servings by 15 g:   2 servings of rice x 15 g = 30 g.   1 serving of corn x 15 g = 15 g.   1 serving of milk x 15 g = 15 g.   1 serving of strawberries x 15 g = 15 g. Step 4: Add together all of the amounts to find the total grams of carbohydrates eaten: 30 g + 15 g + 15 g + 15 g = 75 g.   This information is not intended to replace advice given to you by your health care provider. Make sure you discuss any questions you have with your health care provider.    Document Released: 05/26/2005 Document Revised: 06/16/2014 Document Reviewed: 04/22/2013 Elsevier Interactive Patient Education [employer].

## 2015-12-10 ENCOUNTER — Ambulatory Visit (INDEPENDENT_AMBULATORY_CARE_PROVIDER_SITE_OTHER): Payer: Medicare Other | Admitting: Physician Assistant

## 2015-12-10 VITALS — BP 170/100 | HR 72 | Temp 98.3°F | Resp 16 | Wt 220.6 lb

## 2015-12-10 DIAGNOSIS — E1142 Type 2 diabetes mellitus with diabetic polyneuropathy: Secondary | ICD-10-CM

## 2015-12-10 DIAGNOSIS — I10 Essential (primary) hypertension: Secondary | ICD-10-CM

## 2015-12-10 NOTE — Progress Notes (Signed)
Patient: Alexis Lloyd Female    DOB: 11-28-1960   55 y.o.   MRN: [id number] Visit Date: 12/10/2015  Today's Provider: Mar Daring, PA-C   Chief Complaint  Patient presents with  . Hypertension  . Blood Sugar Problem   Subjective:    HPI Sugar: Patient reports that her sugar level today was 150 fasting. She has not taken her medication this morning. She reports that she is going to the bathroom more frequently with Januvia. She denies any symptoms like she had before. No burning, no vaginal irritation, no itching.  Blood Pressure: She also reports that she has been feeling dizzy, lightheaded, nauseous, headache and feels confused since last night. She went to [facility] this morning to check her blood pressure and it was 181/112. Has not had her medications this morning.  Addend: I had her take her medications and call back with her BP reading from home. It was 119/100 at lunch time on 12/10/15.    Allergies  Allergen Reactions  . Allegra [Fexofenadine] Nausea And Vomiting  . Avelox [Moxifloxacin] Other (See Comments)    Hallucinations  . Bactrim [Sulfamethoxazole-Trimethoprim] Other (See Comments)    unknown  . Etodolac Nausea Only  . Penicillins Diarrhea and Nausea And Vomiting  . Sulfa Antibiotics Other (See Comments)    unknown   Current Meds  Medication Sig  . ACCU-CHEK FASTCLIX LANCETS MISC To check blood sugars daily.  DX: E11.9  . albuterol (PROVENTIL HFA) 108 (90 Base) MCG/ACT inhaler Inhale 2 puffs into the lungs every 6 (six) hours as needed.  . Blood Glucose Monitoring Suppl (ACCU-CHEK AVIVA PLUS) w/Device KIT USE  DAILY  . cetirizine (ZYRTEC) 10 MG tablet Take 1 tablet (10 mg total) by mouth daily.  . Cholecalciferol (D-5000) 5000 UNITS TABS Take 1 tablet by mouth daily.   Marland Kitchen lisinopril-hydrochlorothiazide (PRINZIDE,ZESTORETIC) 20-12.5 MG tablet Take 1 tablet by mouth daily.  Marland Kitchen lovastatin (MEVACOR) 40 MG tablet Take 1 tablet (40 mg total) by mouth at  bedtime.  . meloxicam (MOBIC) 15 MG tablet Take 1 tablet (15 mg total) by mouth daily.  . metFORMIN (GLUCOPHAGE) 1000 MG tablet Take 1,000 mg by mouth daily with breakfast.   . montelukast (SINGULAIR) 10 MG tablet Take 1 tablet (10 mg total) by mouth daily.  . OMEGA-3 FATTY ACIDS PO Take 1 tablet by mouth daily.   . Omeprazole 20 MG TBEC Take 1 tablet (20 mg total) by mouth daily.  . sitaGLIPtin (JANUVIA) 100 MG tablet Take 1 tablet (100 mg total) by mouth daily.  Marland Kitchen spironolactone (ALDACTONE) 25 MG tablet Take 1 tablet (25 mg total) by mouth once.  . tamoxifen (NOLVADEX) 10 MG tablet Take 1 tablet (10 mg total) by mouth daily.  . verapamil (CALAN) 80 MG tablet Take 1 tablet (80 mg total) by mouth 2 (two) times daily.    Review of Systems  Constitutional: Positive for fatigue.  Respiratory: Negative for cough, chest tightness and shortness of breath.   Cardiovascular: Negative for chest pain, palpitations and leg swelling.  Gastrointestinal: Negative for nausea, vomiting, abdominal pain, diarrhea and constipation.  Genitourinary: Positive for frequency. Negative for dysuria, urgency, hematuria, flank pain and pelvic pain.  Musculoskeletal: Negative for back pain, joint swelling and gait problem.  Neurological: Positive for dizziness, light-headedness and headaches. Negative for weakness and numbness.    Social History  Substance Use Topics  . Smoking status: Never Smoker   . Smokeless tobacco: Never Used  . Alcohol  Use: No   Objective:   BP 170/100 mmHg  Pulse 72  Temp(Src) 98.3 F (36.8 C) (Oral)  Resp 16  Wt 220 lb 9.6 oz (100.064 kg)  Physical Exam  Constitutional: She appears well-developed and well-nourished. No distress.  Neck: Normal range of motion. Neck supple. No JVD present. No tracheal deviation present. No thyromegaly present.  Cardiovascular: Normal rate, regular rhythm, normal heart sounds and intact distal pulses.  Exam reveals no gallop and no friction rub.     No murmur heard. Pulmonary/Chest: Effort normal and breath sounds normal. No respiratory distress. She has no wheezes. She has no rales.  Musculoskeletal: She exhibits no edema.  Lymphadenopathy:    She has no cervical adenopathy.  Skin: She is not diaphoretic.  Psychiatric: She has a normal mood and affect. Her behavior is normal. Judgment and thought content normal.  Vitals reviewed.     Assessment & Plan:     1. Essential hypertension After taking her medications this morning her BP came down to 119/100. I advised her to continue her medications and to check her BP tomorrow and Weds morning. If diastolic BP is still elevated will add another agent to help lower diastolic BP. She is to continue lisinopril-HCTZ and amlodipine for now. She will be switching to verapamil instead of amlodipine once she runs out of her amlodipine. She will call with her BP readings. If symptoms worsen she is to go to the hospital.  2. Type 2 diabetes mellitus with diabetic polyneuropathy, without long-term current use of insulin (HCC) Stable. Continue metformin and januvia.        Jennifer M Burnette, PA-C  [facility] 

## 2015-12-24 DIAGNOSIS — M7732 Calcaneal spur, left foot: Secondary | ICD-10-CM | POA: Diagnosis not present

## 2015-12-24 DIAGNOSIS — M7731 Calcaneal spur, right foot: Secondary | ICD-10-CM | POA: Diagnosis not present

## 2015-12-24 DIAGNOSIS — M2022 Hallux rigidus, left foot: Secondary | ICD-10-CM | POA: Diagnosis not present

## 2015-12-24 DIAGNOSIS — M79671 Pain in right foot: Secondary | ICD-10-CM | POA: Diagnosis not present

## 2015-12-24 DIAGNOSIS — M79672 Pain in left foot: Secondary | ICD-10-CM | POA: Diagnosis not present

## 2015-12-24 DIAGNOSIS — M19072 Primary osteoarthritis, left ankle and foot: Secondary | ICD-10-CM | POA: Diagnosis not present

## 2015-12-24 DIAGNOSIS — M19071 Primary osteoarthritis, right ankle and foot: Secondary | ICD-10-CM | POA: Diagnosis not present

## 2015-12-24 DIAGNOSIS — E1142 Type 2 diabetes mellitus with diabetic polyneuropathy: Secondary | ICD-10-CM | POA: Diagnosis not present

## 2015-12-31 ENCOUNTER — Telehealth: Payer: Self-pay | Admitting: Physician Assistant

## 2015-12-31 ENCOUNTER — Telehealth: Payer: Self-pay | Admitting: Emergency Medicine

## 2015-12-31 ENCOUNTER — Encounter: Payer: Self-pay | Admitting: Physician Assistant

## 2015-12-31 NOTE — Telephone Encounter (Signed)
Pt has picked up letter. 

## 2015-12-31 NOTE — Telephone Encounter (Signed)
LMTCB. Handicapped sticker ready for pick up. Thanks.

## 2015-12-31 NOTE — Telephone Encounter (Signed)
Letter has been printed and will be placed up front for pick up.

## 2015-12-31 NOTE — Telephone Encounter (Signed)
Will place with handicap placard letter

## 2015-12-31 NOTE — Telephone Encounter (Signed)
Pt returned called. I advised pt as below and pt is requesting Tawanna Sat do an order for diabetic shoes and she will come pick up the order when ready. Please advise. Thanks TNP

## 2016-02-04 ENCOUNTER — Telehealth: Payer: Self-pay | Admitting: Physician Assistant

## 2016-02-04 DIAGNOSIS — E7800 Pure hypercholesterolemia, unspecified: Secondary | ICD-10-CM

## 2016-02-04 MED ORDER — LOVASTATIN 20 MG PO TABS: 40.0000 mg | ORAL_TABLET | Freq: Every day | ORAL | 3 refills | Status: DC

## 2016-02-04 NOTE — Telephone Encounter (Signed)
Have sent rx for 20mg  lovastatin tablets to [facility].

## 2016-02-04 NOTE — Telephone Encounter (Signed)
Spoke with patient to advise that lovastatin is indeed for cholesterol, not blood pressure. I advised patient that per Tawanna Sat, she is to continue taking lovastatin and was to discontinue amlodipine due to cost. When patient ran out of her amlodipine tablets, she was to start Verapamil. Patient became aware of this and voiced understanding.   Patient is also requesting if Lovastatin could be called in as (2) 20mg  tablets instead of 40mg  daily due to it only being $8 vs $25. Ok to send in Lovastatin 20mg  BID into the pharmacy? Please advise. Thanks!

## 2016-02-04 NOTE — Telephone Encounter (Signed)
Pt would like a call back to confirm what medications Jenni changed. Pt stated that she thought that the lovastatin (MEVACOR) 40 MG tablet  Was going to take the of the amLODipine (NORVASC) 5 MG tablet but the pharmacy advised pt that amlodipine was for blood pressure not cholesterol. Pt was advised that Tawanna Sat is out of the office this week. Please advise. Thanks TNP

## 2016-02-20 DIAGNOSIS — C50911 Malignant neoplasm of unspecified site of right female breast: Secondary | ICD-10-CM | POA: Diagnosis not present

## 2016-02-20 DIAGNOSIS — C50912 Malignant neoplasm of unspecified site of left female breast: Secondary | ICD-10-CM | POA: Diagnosis not present

## 2016-02-20 LAB — HM MAMMOGRAPHY

## 2016-02-25 ENCOUNTER — Ambulatory Visit: Payer: Medicare Other | Admitting: Family Medicine

## 2016-03-05 ENCOUNTER — Ambulatory Visit (INDEPENDENT_AMBULATORY_CARE_PROVIDER_SITE_OTHER): Payer: Medicare Other | Admitting: Physician Assistant

## 2016-03-05 ENCOUNTER — Encounter: Payer: Self-pay | Admitting: Physician Assistant

## 2016-03-05 VITALS — BP 120/60 | HR 66 | Temp 97.8°F | Resp 16 | Wt 222.0 lb

## 2016-03-05 DIAGNOSIS — E1143 Type 2 diabetes mellitus with diabetic autonomic (poly)neuropathy: Secondary | ICD-10-CM

## 2016-03-05 DIAGNOSIS — R8290 Unspecified abnormal findings in urine: Secondary | ICD-10-CM

## 2016-03-05 DIAGNOSIS — E7800 Pure hypercholesterolemia, unspecified: Secondary | ICD-10-CM | POA: Diagnosis not present

## 2016-03-05 DIAGNOSIS — I10 Essential (primary) hypertension: Secondary | ICD-10-CM | POA: Diagnosis not present

## 2016-03-05 DIAGNOSIS — N3000 Acute cystitis without hematuria: Secondary | ICD-10-CM

## 2016-03-05 LAB — POCT URINALYSIS DIPSTICK
Bilirubin, UA: NEGATIVE
Blood, UA: NEGATIVE
Glucose, UA: NEGATIVE
Ketones, UA: NEGATIVE
Leukocytes, UA: NEGATIVE
Nitrite, UA: NEGATIVE
Protein, UA: NEGATIVE
Spec Grav, UA: 1.015
Urobilinogen, UA: 0.2
pH, UA: 6

## 2016-03-05 LAB — POCT UA - MICROALBUMIN: Microalbumin Ur, POC: 50 mg/L

## 2016-03-05 LAB — POCT GLYCOSYLATED HEMOGLOBIN (HGB A1C)
Est. average glucose Bld gHb Est-mCnc: 177
Hemoglobin A1C: 7.8

## 2016-03-05 NOTE — Patient Instructions (Signed)

## 2016-03-05 NOTE — Progress Notes (Signed)
Patient: Alexis Lloyd Female    DOB: 12/19/1960   55 y.o.   MRN: [id number] Visit Date: 03/05/2016  Today's Provider: Mar Daring, PA-C   Chief Complaint  Patient presents with  . Follow-up    HTN, Hypercholesteremia,Diabetes   Subjective:    HPI  Diabetes Mellitus Type II, Follow-up:   Lab Results  Component Value Date   HGBA1C 7.8 03/05/2016   HGBA1C 7.9 11/16/2015   HGBA1C 8.3 07/16/2015   Last seen for diabetes 3 months ago.  Management since then includes None. She reports excellent compliance with treatment. She is not having side effects.  Current symptoms include none and have been stable. Home blood sugar records: 126-130's   Current Insulin Regimen: None Most Recent Eye Exam: None recent-need to get; will schedule Weight trend: stable Current diet: in general, a "healthy" diet   Current exercise: walking  ------------------------------------------------------------------------   Hypertension, follow-up:  BP Readings from Last 3 Encounters:  03/05/16 120/60  12/10/15 (!) 170/100  12/03/15 (!) 150/80    She was last seen for hypertension 3 months ago.  BP at that visit was 170/100. Management since that visit includes continue current medication Lisinopril-HCTZ and Amlodipine until finish then switch to Verapamil instead of Amlodipine.She reports excellent compliance with treatment. She is not having side effects. She is exercising. She is adherent to low salt diet.   Outside blood pressures are n/a. She is experiencing none.  Patient denies chest pain, chest pressure/discomfort, exertional chest pressure/discomfort, fatigue, irregular heart beat, lower extremity edema and palpitations.   Cardiovascular risk factors include diabetes mellitus, dyslipidemia, hypertension and obesity (BMI >= 30 kg/m2).   ------------------------------------------------------------------------    Lipid/Cholesterol, Follow-up:   Last seen for this  3 months ago.  Management since that visit includes started Lovastatin.  Last Lipid Panel:    Component Value Date/Time   CHOL 158 07/18/2015 0855   TRIG 178 (H) 07/18/2015 0855   HDL 27 (L) 07/18/2015 0855   CHOLHDL 5.9 (H) 07/18/2015 0855   [place] 95 07/18/2015 0855    She reports excellent compliance with treatment. She is not having side effects.   Wt Readings from Last 3 Encounters:  03/05/16 222 lb (100.7 kg)  12/10/15 220 lb 9.6 oz (100.1 kg)  12/03/15 226 lb 12.8 oz (102.9 kg)    ------------------------------------------------------------------------  Patient is also concern that her urine smells bad.    Allergies  Allergen Reactions  . Alexis [Fexofenadine] Nausea And Vomiting  . Avelox [Moxifloxacin] Other (See Comments)    Hallucinations  . Bactrim [Sulfamethoxazole-Trimethoprim] Other (See Comments)    unknown  . Etodolac Nausea Only  . Penicillins Diarrhea and Nausea And Vomiting  . Sulfa Antibiotics Other (See Comments)    unknown     Current Outpatient Prescriptions:  .  ACCU-CHEK FASTCLIX LANCETS MISC, To check blood sugars daily.  DX: E11.9, Disp: 102 each, Rfl: 3 .  albuterol (PROVENTIL HFA) 108 (90 Base) MCG/ACT inhaler, Inhale 2 puffs into the lungs every 6 (six) hours as needed., Disp: 3 Inhaler, Rfl: 1 .  Blood Glucose Monitoring Suppl (ACCU-CHEK AVIVA PLUS) w/Device KIT, USE  DAILY, Disp: 1 kit, Rfl: 0 .  cetirizine (ZYRTEC) 10 MG tablet, Take 1 tablet (10 mg total) by mouth daily., Disp: 90 tablet, Rfl: 1 .  Cholecalciferol (D-5000) 5000 UNITS TABS, Take 1 tablet by mouth daily. , Disp: , Rfl:  .  lisinopril-hydrochlorothiazide (PRINZIDE,ZESTORETIC) 20-12.5 MG tablet, Take 1 tablet by mouth daily.,  Disp: 90 tablet, Rfl: 1 .  lovastatin (MEVACOR) 20 MG tablet, Take 2 tablets (40 mg total) by mouth daily., Disp: 60 tablet, Rfl: 3 .  meloxicam (MOBIC) 15 MG tablet, Take 1 tablet (15 mg total) by mouth daily., Disp: 90 tablet, Rfl: 1 .   metFORMIN (GLUCOPHAGE) 1000 MG tablet, Take 1,000 mg by mouth daily with breakfast. , Disp: , Rfl:  .  montelukast (SINGULAIR) 10 MG tablet, Take 1 tablet (10 mg total) by mouth daily., Disp: 90 tablet, Rfl: 3 .  OMEGA-3 FATTY ACIDS PO, Take 1 tablet by mouth daily. , Disp: , Rfl:  .  Omeprazole 20 MG TBEC, Take 1 tablet (20 mg total) by mouth daily., Disp: 90 each, Rfl: 1 .  sitaGLIPtin (JANUVIA) 100 MG tablet, Take 1 tablet (100 mg total) by mouth daily., Disp: 90 tablet, Rfl: 1 .  spironolactone (ALDACTONE) 25 MG tablet, Take 1 tablet (25 mg total) by mouth once., Disp: 90 tablet, Rfl: 1 .  tamoxifen (NOLVADEX) 10 MG tablet, Take 1 tablet (10 mg total) by mouth daily., Disp: 90 tablet, Rfl: 1 .  verapamil (CALAN) 80 MG tablet, Take 1 tablet (80 mg total) by mouth 2 (two) times daily., Disp: 60 tablet, Rfl: 3 .  mometasone-formoterol (DULERA) 100-5 MCG/ACT AERO, Inhale 2 puffs into the lungs daily. (Patient not taking: Reported on 03/05/2016), Disp: 39 g, Rfl: 1  Review of Systems  Constitutional: Negative.   Respiratory: Negative.   Cardiovascular: Negative.   Gastrointestinal: Negative.   Genitourinary: Positive for dysuria (foul smell) and frequency. Negative for flank pain, hematuria, menstrual problem, pelvic pain and urgency.  Neurological: Negative.     Social History  Substance Use Topics  . Smoking status: Never Smoker  . Smokeless tobacco: Never Used  . Alcohol use No   Objective:   BP 120/60 (BP Location: Right Arm, Patient Position: Sitting, Cuff Size: Normal)   Pulse 66   Temp 97.8 F (36.6 C) (Oral)   Resp 16   Wt 222 lb (100.7 kg)   BMI 44.84 kg/m   Physical Exam  Constitutional: She appears well-developed and well-nourished. No distress.  Neck: Normal range of motion. Neck supple. No JVD present. No tracheal deviation present. No thyromegaly present.  Cardiovascular: Normal rate, regular rhythm and normal heart sounds.  Exam reveals no gallop and no friction  rub.   No murmur heard. Pulmonary/Chest: Effort normal and breath sounds normal. No respiratory distress. She has no wheezes. She has no rales.  Musculoskeletal: She exhibits no edema.  Lymphadenopathy:    She has no cervical adenopathy.  Skin: She is not diaphoretic.  Psychiatric: She has a normal mood and affect. Her behavior is normal. Judgment and thought content normal.  Vitals reviewed.     Assessment & Plan:     1. Essential hypertension Stable on lisinopril-hctz and verapamil. Continue current medical treatment plan. I will see her back in 6 months and we will recheck all labs at that time.  2. Diabetic autonomic neuropathy associated with type 2 diabetes mellitus ([place]) HgBA1c improveed slightly to 7.8 but has decreased from 8.6 in one year. Microalbumin is stable at 50. Continue Januvia and metformin. I will see her back in 6 months.  - POCT glycosylated hemoglobin (Hb A1C) - POCT UA - Microalbumin  3. Hypercholesteremia Stable. Continue lovastatin. Continue lifestyle modifications. I will see her back in 6 months and will check labs.  4. Bad odor of urine UA in office was normal. Will send for  urine culture. Will treat if culture positive.  - POCT urinalysis dipstick - Urine Culture       Mar Daring, PA-C  [facility]

## 2016-03-06 ENCOUNTER — Other Ambulatory Visit: Payer: Self-pay | Admitting: Physician Assistant

## 2016-03-06 DIAGNOSIS — I10 Essential (primary) hypertension: Secondary | ICD-10-CM

## 2016-03-06 MED ORDER — LISINOPRIL-HYDROCHLOROTHIAZIDE 20-12.5 MG PO TABS: 1.0000 | ORAL_TABLET | Freq: Every day | ORAL | 1 refills | Status: DC

## 2016-03-06 NOTE — Telephone Encounter (Signed)
Patient called requesting refills. Thanks!  

## 2016-03-06 NOTE — Telephone Encounter (Signed)
Pt contacted office for refill request on the following medications:  lisinopril-hydrochlorothiazide (PRINZIDE,ZESTORETIC) 20-12.5 MG tablet. [place]   [id number]

## 2016-03-07 ENCOUNTER — Telehealth: Payer: Self-pay

## 2016-03-07 DIAGNOSIS — N3000 Acute cystitis without hematuria: Secondary | ICD-10-CM

## 2016-03-07 LAB — PLEASE NOTE

## 2016-03-07 LAB — URINE CULTURE

## 2016-03-07 MED ORDER — CEPHALEXIN 500 MG PO CAPS: 500.0000 mg | ORAL_CAPSULE | Freq: Three times a day (TID) | ORAL | 0 refills | Status: DC

## 2016-03-07 MED ORDER — NITROFURANTOIN MONOHYD MACRO 100 MG PO CAPS: 100.0000 mg | ORAL_CAPSULE | Freq: Two times a day (BID) | ORAL | 0 refills | Status: DC

## 2016-03-07 NOTE — Telephone Encounter (Signed)
Pt advised. Alexis Lloyd, CMA  

## 2016-03-07 NOTE — Telephone Encounter (Signed)
-----   Message from Mar Daring, [place] sent at 03/07/2016  1:46 PM EDT ----- Culture was positive for e.coli bacteria again and is sensitive to multiple antibiotics. Will send in macrobid to pharmacy.

## 2016-03-07 NOTE — Telephone Encounter (Signed)
Pt reports that Macrobid causes her "bowels not to move" and gives her a globus hystericus sensation. Allergy list updated. Please advise. Renaldo Fiddler, CMA

## 2016-03-07 NOTE — Addendum Note (Signed)
Addended by: Mar Daring on: 03/07/2016 01:47 PM   Modules accepted: Orders

## 2016-03-07 NOTE — Telephone Encounter (Signed)
Changed to keflex 

## 2016-06-13 ENCOUNTER — Encounter: Payer: Self-pay | Admitting: Physician Assistant

## 2016-06-13 ENCOUNTER — Ambulatory Visit
Admission: RE | Admit: 2016-06-13 | Discharge: 2016-06-13 | Disposition: A | Discharge status: Home / Self Care | Payer: Medicare HMO | Source: Ambulatory Visit | Attending: Physician Assistant | Admitting: Physician Assistant

## 2016-06-13 ENCOUNTER — Ambulatory Visit (INDEPENDENT_AMBULATORY_CARE_PROVIDER_SITE_OTHER): Payer: Medicare HMO | Admitting: Physician Assistant

## 2016-06-13 VITALS — BP 128/70 | HR 82 | Temp 98.2°F | Resp 16 | Wt 222.0 lb

## 2016-06-13 DIAGNOSIS — R109 Unspecified abdominal pain: Secondary | ICD-10-CM | POA: Diagnosis not present

## 2016-06-13 DIAGNOSIS — M1288 Other specific arthropathies, not elsewhere classified, other specified site: Secondary | ICD-10-CM | POA: Diagnosis not present

## 2016-06-13 DIAGNOSIS — R1084 Generalized abdominal pain: Secondary | ICD-10-CM | POA: Insufficient documentation

## 2016-06-13 DIAGNOSIS — M5136 Other intervertebral disc degeneration, lumbar region: Secondary | ICD-10-CM | POA: Diagnosis not present

## 2016-06-13 DIAGNOSIS — R140 Abdominal distension (gaseous): Secondary | ICD-10-CM | POA: Insufficient documentation

## 2016-06-13 DIAGNOSIS — N3000 Acute cystitis without hematuria: Secondary | ICD-10-CM | POA: Diagnosis not present

## 2016-06-13 DIAGNOSIS — R300 Dysuria: Secondary | ICD-10-CM | POA: Diagnosis not present

## 2016-06-13 DIAGNOSIS — Z9049 Acquired absence of other specified parts of digestive tract: Secondary | ICD-10-CM | POA: Insufficient documentation

## 2016-06-13 DIAGNOSIS — K5900 Constipation, unspecified: Secondary | ICD-10-CM

## 2016-06-13 LAB — POCT URINALYSIS DIPSTICK
Bilirubin, UA: NEGATIVE
Glucose, UA: NEGATIVE
Ketones, UA: NEGATIVE
Leukocytes, UA: NEGATIVE
Nitrite, UA: POSITIVE
Spec Grav, UA: 1.015
Urobilinogen, UA: 0.2
pH, UA: 6.5

## 2016-06-13 MED ORDER — POLYETHYLENE GLYCOL 3350 17 GM/SCOOP PO POWD: 17.0000 g | Freq: Two times a day (BID) | ORAL | 1 refills | Status: DC | PRN

## 2016-06-13 NOTE — Patient Instructions (Signed)

## 2016-06-13 NOTE — Progress Notes (Signed)
Patient: Alexis Lloyd Female    DOB: Nov 15, 1960   56 y.o.   MRN: [id number] Visit Date: 06/13/2016  Today's Provider: Mar Daring, PA-C   Chief Complaint  Patient presents with  . Back Pain   Subjective:    Back Pain  This is a new problem. Episode onset: 3 days. The pain is present in the lumbar spine. The quality of the pain is described as aching. Worse during: worse in the AM. The symptoms are aggravated by lying down and standing. Associated symptoms include abdominal pain. Pertinent negatives include no chest pain, dysuria, fever, headaches, numbness, tingling or weakness. (Green bowel movement yesterday) Risk factors include history of cancer. The treatment provided no relief.   She is actually complaining more so of generalized abdominal tenderness and bloating sensation. She has had associated nausea with this. This has been ongoing 1 week. There has been no fever or chills. She has had no vomiting. There was one episode of abnormal bowel movements that included a green discolored bowel movement. She reports that the stool was of normal consistency, not watery or loose. She does report that she has not been having consistent bowel movements stating she may have one bowel movement every 2-3 days.  She does also have history of recurrent urinary tract infections. She does have some dysuria, denies any vaginal discharge or vaginal bleeding.    Allergies  Allergen Reactions  . Allegra [Fexofenadine] Nausea And Vomiting  . Avelox [Moxifloxacin] Other (See Comments)    Hallucinations  . Bactrim [Sulfamethoxazole-Trimethoprim] Other (See Comments)    unknown  . Etodolac Nausea Only  . Penicillins Diarrhea and Nausea And Vomiting  . Sulfa Antibiotics Other (See Comments)    unknown  . Macrobid [employer] Macro] Other (See Comments)    Constipation and globus hystericus.     Current Outpatient Prescriptions:  .  ACCU-CHEK FASTCLIX LANCETS MISC, To  check blood sugars daily.  DX: E11.9, Disp: 102 each, Rfl: 3 .  albuterol (PROVENTIL HFA) 108 (90 Base) MCG/ACT inhaler, Inhale 2 puffs into the lungs every 6 (six) hours as needed., Disp: 3 Inhaler, Rfl: 1 .  Blood Glucose Monitoring Suppl (ACCU-CHEK AVIVA PLUS) w/Device KIT, USE  DAILY, Disp: 1 kit, Rfl: 0 .  cetirizine (ZYRTEC) 10 MG tablet, Take 1 tablet (10 mg total) by mouth daily., Disp: 90 tablet, Rfl: 1 .  Cholecalciferol (D-5000) 5000 UNITS TABS, Take 1 tablet by mouth daily. , Disp: , Rfl:  .  lisinopril-hydrochlorothiazide (PRINZIDE,ZESTORETIC) 20-12.5 MG tablet, Take 1 tablet by mouth daily., Disp: 90 tablet, Rfl: 1 .  lovastatin (MEVACOR) 20 MG tablet, Take 2 tablets (40 mg total) by mouth daily., Disp: 60 tablet, Rfl: 3 .  meloxicam (MOBIC) 15 MG tablet, Take 1 tablet (15 mg total) by mouth daily., Disp: 90 tablet, Rfl: 1 .  metFORMIN (GLUCOPHAGE) 1000 MG tablet, Take 1,000 mg by mouth daily with breakfast. , Disp: , Rfl:  .  mometasone-formoterol (DULERA) 100-5 MCG/ACT AERO, Inhale 2 puffs into the lungs daily., Disp: 39 g, Rfl: 1 .  montelukast (SINGULAIR) 10 MG tablet, Take 1 tablet (10 mg total) by mouth daily., Disp: 90 tablet, Rfl: 3 .  OMEGA-3 FATTY ACIDS PO, Take 1 tablet by mouth daily. , Disp: , Rfl:  .  Omeprazole 20 MG TBEC, Take 1 tablet (20 mg total) by mouth daily., Disp: 90 each, Rfl: 1 .  sitaGLIPtin (JANUVIA) 100 MG tablet, Take 1 tablet (100 mg total)  by mouth daily., Disp: 90 tablet, Rfl: 1 .  spironolactone (ALDACTONE) 25 MG tablet, Take 1 tablet (25 mg total) by mouth once., Disp: 90 tablet, Rfl: 1 .  tamoxifen (NOLVADEX) 10 MG tablet, Take 1 tablet (10 mg total) by mouth daily., Disp: 90 tablet, Rfl: 1 .  verapamil (CALAN) 80 MG tablet, Take 1 tablet (80 mg total) by mouth 2 (two) times daily., Disp: 60 tablet, Rfl: 3 .  cephALEXin (KEFLEX) 500 MG capsule, Take 1 capsule (500 mg total) by mouth 3 (three) times daily. (Patient not taking: Reported on 06/13/2016),  Disp: 15 capsule, Rfl: 0  Review of Systems  Constitutional: Negative for fever.  Respiratory: Negative.   Cardiovascular: Negative for chest pain, palpitations and leg swelling.  Gastrointestinal: Positive for abdominal distention, abdominal pain, constipation and nausea. Negative for anal bleeding, blood in stool, diarrhea, rectal pain and vomiting.  Genitourinary: Negative for dysuria, flank pain, frequency, hematuria, menstrual problem and vaginal discharge.  Musculoskeletal: Positive for back pain.  Neurological: Negative for tingling, weakness, numbness and headaches.    Social History  Substance Use Topics  . Smoking status: Never Smoker  . Smokeless tobacco: Never Used  . Alcohol use No   Objective:   BP 128/70 (BP Location: Right Wrist, Patient Position: Sitting, Cuff Size: Normal)   Pulse 82   Temp 98.2 F (36.8 C) (Oral)   Resp 16   Wt 222 lb (100.7 kg)   BMI 44.84 kg/m   Physical Exam  Constitutional: She is oriented to person, place, and time. She appears well-developed and well-nourished. No distress.  Cardiovascular: Normal rate, regular rhythm and normal heart sounds.  Exam reveals no gallop and no friction rub.   No murmur heard. Pulmonary/Chest: Effort normal and breath sounds normal. No respiratory distress. She has no wheezes. She has no rales.  Abdominal: Soft. Normal appearance and bowel sounds are normal. She exhibits distension. She exhibits no ascites and no mass. There is no hepatosplenomegaly. There is generalized tenderness. There is no rebound, no guarding and no CVA tenderness.  Neurological: She is alert and oriented to person, place, and time.  Skin: Skin is warm and dry. She is not diaphoretic.      Assessment & Plan:     1. Dysuria UA was positive for nitrites. We will send urine for culture. I will follow-up pending culture results. I will treat if necessary pending these results. - POCT Urinalysis Dipstick - Urine Culture  2.  Generalized abdominal pain Unknown cause. Possible viral gastroenteritis but no fever, diarrhea and nausea or vomiting. I will get a x-ray of the abdomen as below. I will follow-up with her pending the results of the x-ray. - DG Abd 2 Views; Future  3. Abdominal bloating See above medical treatment plan for #2. - DG Abd 2 Views; Future  4. Constipation, unspecified constipation type Abdominal x-ray was unremarkable with the exception of moderate stool burden. We'll add MiraLAX as below until patient is having a loose bowel movement daily, not watery. She is to call the office if symptoms fail to improve or if they worsen. - polyethylene glycol powder (GLYCOLAX/MIRALAX) powder; Take 17 g by mouth 2 (two) times daily as needed.  Dispense: 3350 g; Refill: [place], PA-C  [facility]

## 2016-06-16 ENCOUNTER — Other Ambulatory Visit: Payer: Self-pay | Admitting: Physician Assistant

## 2016-06-16 DIAGNOSIS — I10 Essential (primary) hypertension: Secondary | ICD-10-CM

## 2016-06-16 DIAGNOSIS — R109 Unspecified abdominal pain: Secondary | ICD-10-CM

## 2016-06-16 MED ORDER — DICYCLOMINE HCL 10 MG PO CAPS: 10.0000 mg | ORAL_CAPSULE | Freq: Three times a day (TID) | ORAL | 0 refills | Status: DC

## 2016-06-16 NOTE — Progress Notes (Signed)
Patient was advised by Tawanna Sat of results.

## 2016-06-16 NOTE — Telephone Encounter (Signed)
Please advise.  Thanks,  -Keion Neels 

## 2016-06-16 NOTE — Telephone Encounter (Signed)
Please notify patient Alexis Lloyd sent for cramping. Only has to take prn and may take 4 times per day. Push fluids.

## 2016-06-16 NOTE — Telephone Encounter (Signed)
Pt was in last week for stomach pain and constipation.  She was put on the myralax and now she is having alot of stomach cramps  She wants to know what you advise her to do for the stomach pain and cramps.  Her call back is [phone]  Thank sTeri

## 2016-06-16 NOTE — Telephone Encounter (Signed)
Patient advised as directed below.  Thanks,  -Jenene Kauffmann 

## 2016-06-18 ENCOUNTER — Telehealth: Payer: Self-pay

## 2016-06-18 DIAGNOSIS — R109 Unspecified abdominal pain: Secondary | ICD-10-CM

## 2016-06-18 LAB — URINE CULTURE

## 2016-06-18 MED ORDER — DICYCLOMINE HCL 10 MG PO CAPS: 10.0000 mg | ORAL_CAPSULE | Freq: Three times a day (TID) | ORAL | 0 refills | Status: DC

## 2016-06-18 MED ORDER — CEFUROXIME AXETIL 500 MG PO TABS: 500.0000 mg | ORAL_TABLET | Freq: Two times a day (BID) | ORAL | 0 refills | Status: DC

## 2016-06-18 NOTE — Addendum Note (Signed)
Addended by: Mar Daring on: 06/18/2016 10:11 AM   Modules accepted: Orders

## 2016-06-18 NOTE — Telephone Encounter (Signed)
Patient advised. Patient states RX for Bentyl was not sent to [place]. Resent to correct pharmacy. RX was originally sent to [facility]

## 2016-06-18 NOTE — Telephone Encounter (Signed)
-----   Message from Mar Daring, [place] sent at 06/18/2016 10:09 AM EST ----- Urine culture was positive for E. Coli. I will send in antibiotic it is susceptible to for treatment.

## 2016-06-20 ENCOUNTER — Encounter: Payer: Self-pay | Admitting: Physician Assistant

## 2016-06-20 ENCOUNTER — Ambulatory Visit (INDEPENDENT_AMBULATORY_CARE_PROVIDER_SITE_OTHER): Payer: Medicare HMO | Admitting: Physician Assistant

## 2016-06-20 VITALS — BP 110/84 | HR 86 | Temp 97.9°F | Resp 20 | Wt 223.0 lb

## 2016-06-20 DIAGNOSIS — J0100 Acute maxillary sinusitis, unspecified: Secondary | ICD-10-CM

## 2016-06-20 MED ORDER — AZITHROMYCIN 250 MG PO TABS: ORAL_TABLET | ORAL | 1 refills | Status: DC

## 2016-06-20 NOTE — Progress Notes (Signed)
Patient: Alexis Lloyd Female    DOB: 1960-11-08   56 y.o.   MRN: [id number] Visit Date: 06/20/2016  Today's Provider: Mar Daring, PA-C   Chief Complaint  Patient presents with  . URI   Subjective:    HPI Upper Respiratory Infection: Patient complains of symptoms of a URI. Symptoms include congestion, cough and sore throat. Onset of symptoms was 10 days ago, unchanged since that time. She also c/o achiness, congestion and cough described as productive of yellow sputum for the past 2 days .  She is drinking plenty of fluids. Evaluation to date: none. Treatment to date: decongestants.      Allergies  Allergen Reactions  . Allegra [Fexofenadine] Nausea And Vomiting  . Avelox [Moxifloxacin] Other (See Comments)    Hallucinations  . Bactrim [Sulfamethoxazole-Trimethoprim] Other (See Comments)    unknown  . Etodolac Nausea Only  . Penicillins Diarrhea and Nausea And Vomiting  . Sulfa Antibiotics Other (See Comments)    unknown  . Macrobid [employer] Macro] Other (See Comments)    Constipation and globus hystericus.     Current Outpatient Prescriptions:  .  ACCU-CHEK FASTCLIX LANCETS MISC, To check blood sugars daily.  DX: E11.9, Disp: 102 each, Rfl: 3 .  albuterol (PROVENTIL HFA) 108 (90 Base) MCG/ACT inhaler, Inhale 2 puffs into the lungs every 6 (six) hours as needed., Disp: 3 Inhaler, Rfl: 1 .  Blood Glucose Monitoring Suppl (ACCU-CHEK AVIVA PLUS) w/Device KIT, USE  DAILY, Disp: 1 kit, Rfl: 0 .  cetirizine (ZYRTEC) 10 MG tablet, Take 1 tablet (10 mg total) by mouth daily., Disp: 90 tablet, Rfl: 1 .  Cholecalciferol (D-5000) 5000 UNITS TABS, Take 1 tablet by mouth daily. , Disp: , Rfl:  .  lisinopril-hydrochlorothiazide (PRINZIDE,ZESTORETIC) 20-12.5 MG tablet, TAKE ONE TABLET BY MOUTH ONCE DAILY, Disp: 90 tablet, Rfl: 1 .  lovastatin (MEVACOR) 20 MG tablet, Take 2 tablets (40 mg total) by mouth daily., Disp: 60 tablet, Rfl: 3 .  meloxicam (MOBIC)  15 MG tablet, Take 1 tablet (15 mg total) by mouth daily., Disp: 90 tablet, Rfl: 1 .  metFORMIN (GLUCOPHAGE) 1000 MG tablet, Take 1,000 mg by mouth daily with breakfast. , Disp: , Rfl:  .  montelukast (SINGULAIR) 10 MG tablet, Take 1 tablet (10 mg total) by mouth daily., Disp: 90 tablet, Rfl: 3 .  OMEGA-3 FATTY ACIDS PO, Take 1 tablet by mouth daily. , Disp: , Rfl:  .  Omeprazole 20 MG TBEC, Take 1 tablet (20 mg total) by mouth daily., Disp: 90 each, Rfl: 1 .  sitaGLIPtin (JANUVIA) 100 MG tablet, Take 1 tablet (100 mg total) by mouth daily., Disp: 90 tablet, Rfl: 1 .  spironolactone (ALDACTONE) 25 MG tablet, Take 1 tablet (25 mg total) by mouth once., Disp: 90 tablet, Rfl: 1 .  tamoxifen (NOLVADEX) 10 MG tablet, Take 1 tablet (10 mg total) by mouth daily., Disp: 90 tablet, Rfl: 1 .  verapamil (CALAN) 80 MG tablet, Take 1 tablet (80 mg total) by mouth 2 (two) times daily., Disp: 60 tablet, Rfl: 3 .  cefUROXime (CEFTIN) 500 MG tablet, Take 1 tablet (500 mg total) by mouth 2 (two) times daily with a meal. (Patient not taking: Reported on 06/20/2016), Disp: 10 tablet, Rfl: 0 .  dicyclomine (BENTYL) 10 MG capsule, Take 1 capsule (10 mg total) by mouth 4 (four) times daily -  before meals and at bedtime. (Patient not taking: Reported on 06/20/2016), Disp: 40 capsule, Rfl: 0 .  mometasone-formoterol (DULERA) 100-5 MCG/ACT AERO, Inhale 2 puffs into the lungs daily. (Patient not taking: Reported on 06/20/2016), Disp: 39 g, Rfl: 1 .  polyethylene glycol powder (GLYCOLAX/MIRALAX) powder, Take 17 g by mouth 2 (two) times daily as needed. (Patient not taking: Reported on 06/20/2016), Disp: 3350 g, Rfl: 1  Review of Systems  Constitutional: Positive for chills and fatigue. Negative for fever (felt feverish but did not take temp).  HENT: Positive for congestion, postnasal drip, rhinorrhea, sinus pain, sinus pressure, sneezing and sore throat. Negative for ear pain and trouble swallowing.   Respiratory: Positive for  cough, chest tightness, shortness of breath and wheezing.   Cardiovascular: Negative for chest pain, palpitations and leg swelling.  Gastrointestinal: Negative for abdominal pain, nausea and vomiting.  Musculoskeletal: Positive for myalgias.  Neurological: Positive for headaches. Negative for dizziness.    Social History  Substance Use Topics  . Smoking status: Never Smoker  . Smokeless tobacco: Never Used  . Alcohol use No   Objective:   BP 110/84 (BP Location: Right Arm, Patient Position: Sitting, Cuff Size: Large)   Pulse 86   Temp 97.9 F (36.6 C) (Oral)   Resp 20   Wt 223 lb (101.2 kg)   SpO2 96%   BMI 45.04 kg/m   Physical Exam  Constitutional: She appears well-developed and well-nourished. No distress.  HENT:  Head: Normocephalic and atraumatic.  Right Ear: Hearing, external ear and ear canal normal. Tympanic membrane is not erythematous and not bulging. A middle ear effusion is present.  Left Ear: Hearing, tympanic membrane, external ear and ear canal normal. Tympanic membrane is not erythematous and not bulging.  No middle ear effusion.  Nose: Mucosal edema and rhinorrhea present. Right sinus exhibits maxillary sinus tenderness (swelling noted over right maxillary sinus). Right sinus exhibits no frontal sinus tenderness. Left sinus exhibits maxillary sinus tenderness. Left sinus exhibits no frontal sinus tenderness.  Mouth/Throat: Uvula is midline, oropharynx is clear and moist and mucous membranes are normal. No oropharyngeal exudate, posterior oropharyngeal edema or posterior oropharyngeal erythema.  Neck: Normal range of motion. Neck supple. No tracheal deviation present. No thyromegaly present.  Cardiovascular: Normal rate, regular rhythm and normal heart sounds.  Exam reveals no gallop and no friction rub.   No murmur heard. Pulmonary/Chest: Effort normal and breath sounds normal. No stridor. No respiratory distress. She has no wheezes. She has no rales.    Lymphadenopathy:    She has no cervical adenopathy.  Skin: She is not diaphoretic.  Vitals reviewed.      Assessment & Plan:     1. Acute maxillary sinusitis, recurrence not specified Worsening symptoms that have not responded to OTC medications. Will give augmentin as below. Continue allergy medications. Stay well hydrated and get plenty of rest. Call if no symptom improvement or if symptoms worsen. - azithromycin (ZITHROMAX) 250 MG tablet; Take 2 tablets PO on day one, and one tablet PO daily thereafter until completed.  Dispense: 6 tablet; Refill: [place], PA-C  [facility]

## 2016-06-20 NOTE — Patient Instructions (Signed)

## 2016-06-27 ENCOUNTER — Telehealth: Payer: Self-pay | Admitting: Physician Assistant

## 2016-06-27 DIAGNOSIS — J0140 Acute pansinusitis, unspecified: Secondary | ICD-10-CM

## 2016-06-27 DIAGNOSIS — J0100 Acute maxillary sinusitis, unspecified: Secondary | ICD-10-CM

## 2016-06-27 MED ORDER — AZITHROMYCIN 250 MG PO TABS: ORAL_TABLET | ORAL | 0 refills | Status: DC

## 2016-06-27 MED ORDER — DOXYCYCLINE HYCLATE 100 MG PO TABS: 100.0000 mg | ORAL_TABLET | Freq: Two times a day (BID) | ORAL | 0 refills | Status: DC

## 2016-06-27 NOTE — Telephone Encounter (Signed)
Pt called and said she has taken 1 rx of Z Pak and it has not helped.   She said a 2nd rX was to be called in if the first didn't help  She uses [facility]

## 2016-06-27 NOTE — Telephone Encounter (Signed)
[place] is if Zpak didn't work the first time it is most likely not going to work a second time. And due to her allergies listed other antibiotics are contraindicated.

## 2016-06-27 NOTE — Telephone Encounter (Signed)
Please review-aa 

## 2016-06-27 NOTE — Telephone Encounter (Signed)
Pt advised-aa 

## 2016-06-27 NOTE — Telephone Encounter (Signed)
Pt advised. Patient understood./ She wanted to try Zpak again she just can not do the other medication with that price right now-aa

## 2016-06-27 NOTE — Telephone Encounter (Signed)
I will send zpak but cant promise any benefit

## 2016-06-27 NOTE — Telephone Encounter (Signed)
Doxycycline sent to [facility]

## 2016-06-27 NOTE — Telephone Encounter (Signed)
Pt called saying she cann ot afford the Doxcycline.  It was 42.00 at the pharmacy.  She ask to please call in the [place] again.  She uses [facility]  Her call back is [phone]  Thanks Con Memos

## 2016-07-25 ENCOUNTER — Other Ambulatory Visit: Payer: Self-pay

## 2016-07-25 DIAGNOSIS — E7800 Pure hypercholesterolemia, unspecified: Secondary | ICD-10-CM

## 2016-07-25 MED ORDER — LOVASTATIN 20 MG PO TABS: 40.0000 mg | ORAL_TABLET | Freq: Every day | ORAL | 5 refills | Status: DC

## 2016-07-25 NOTE — Telephone Encounter (Signed)
Pharmacy requesting refill Last ov 06/20/16 Last filled 02/04/16 Please review. Thank you. sd

## 2016-07-26 ENCOUNTER — Other Ambulatory Visit: Payer: Self-pay | Admitting: Family Medicine

## 2016-08-02 ENCOUNTER — Emergency Department
Admission: EM | Admit: 2016-08-02 | Discharge: 2016-08-02 | Disposition: A | Discharge status: Home / Self Care | Payer: No Typology Code available for payment source | Attending: Emergency Medicine | Admitting: Emergency Medicine

## 2016-08-02 ENCOUNTER — Encounter: Payer: Self-pay | Admitting: Emergency Medicine

## 2016-08-02 ENCOUNTER — Emergency Department: Payer: No Typology Code available for payment source

## 2016-08-02 DIAGNOSIS — R0789 Other chest pain: Secondary | ICD-10-CM | POA: Insufficient documentation

## 2016-08-02 DIAGNOSIS — Z803 Family history of malignant neoplasm of breast: Secondary | ICD-10-CM | POA: Diagnosis not present

## 2016-08-02 DIAGNOSIS — Y92410 Unspecified street and highway as the place of occurrence of the external cause: Secondary | ICD-10-CM | POA: Insufficient documentation

## 2016-08-02 DIAGNOSIS — S199XXA Unspecified injury of neck, initial encounter: Secondary | ICD-10-CM | POA: Diagnosis not present

## 2016-08-02 DIAGNOSIS — Y999 Unspecified external cause status: Secondary | ICD-10-CM | POA: Diagnosis not present

## 2016-08-02 DIAGNOSIS — I10 Essential (primary) hypertension: Secondary | ICD-10-CM | POA: Insufficient documentation

## 2016-08-02 DIAGNOSIS — M542 Cervicalgia: Secondary | ICD-10-CM | POA: Insufficient documentation

## 2016-08-02 DIAGNOSIS — Z7984 Long term (current) use of oral hypoglycemic drugs: Secondary | ICD-10-CM | POA: Insufficient documentation

## 2016-08-02 DIAGNOSIS — Y9389 Activity, other specified: Secondary | ICD-10-CM | POA: Diagnosis not present

## 2016-08-02 DIAGNOSIS — E119 Type 2 diabetes mellitus without complications: Secondary | ICD-10-CM | POA: Insufficient documentation

## 2016-08-02 DIAGNOSIS — Z79899 Other long term (current) drug therapy: Secondary | ICD-10-CM | POA: Diagnosis not present

## 2016-08-02 DIAGNOSIS — R079 Chest pain, unspecified: Secondary | ICD-10-CM | POA: Diagnosis not present

## 2016-08-02 DIAGNOSIS — S299XXA Unspecified injury of thorax, initial encounter: Secondary | ICD-10-CM | POA: Diagnosis present

## 2016-08-02 DIAGNOSIS — R51 Headache: Secondary | ICD-10-CM | POA: Diagnosis not present

## 2016-08-02 DIAGNOSIS — S0990XA Unspecified injury of head, initial encounter: Secondary | ICD-10-CM | POA: Diagnosis not present
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
## 2016-08-02 NOTE — ED Notes (Signed)
Pt back from ct - still needs xr.
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
## 2016-08-14 ENCOUNTER — Other Ambulatory Visit: Payer: Self-pay | Admitting: Physician Assistant

## 2016-08-14 MED ORDER — METFORMIN HCL 1000 MG PO TABS: 1000.0000 mg | ORAL_TABLET | Freq: Every day | ORAL | 1 refills | Status: DC

## 2016-08-14 NOTE — Telephone Encounter (Signed)
Pt contacted office for refill request on the following medications:  metFORMIN (GLUCOPHAGE) 1000 MG tablet 1 pill 2 times a day.  [place]  NO#[phone]

## 2016-08-20 ENCOUNTER — Telehealth: Payer: Self-pay | Admitting: Physician Assistant

## 2016-08-20 DIAGNOSIS — E1142 Type 2 diabetes mellitus with diabetic polyneuropathy: Secondary | ICD-10-CM

## 2016-08-20 MED ORDER — METFORMIN HCL 1000 MG PO TABS: 1000.0000 mg | ORAL_TABLET | Freq: Two times a day (BID) | ORAL | 1 refills | Status: DC

## 2016-08-20 NOTE — Telephone Encounter (Signed)
Pt is requesting this Rx metFORMIN (GLUCOPHAGE) 1000 MG tablet resent to [place].  Pt is requesting this changed to 2 pills a day.  LH#[phone]

## 2016-08-20 NOTE — Telephone Encounter (Signed)
Metformin sent to [facility]

## 2016-08-21 DIAGNOSIS — I10 Essential (primary) hypertension: Secondary | ICD-10-CM | POA: Diagnosis not present

## 2016-08-21 DIAGNOSIS — E7800 Pure hypercholesterolemia, unspecified: Secondary | ICD-10-CM | POA: Diagnosis not present

## 2016-08-21 DIAGNOSIS — H524 Presbyopia: Secondary | ICD-10-CM | POA: Diagnosis not present

## 2016-08-21 DIAGNOSIS — H2510 Age-related nuclear cataract, unspecified eye: Secondary | ICD-10-CM | POA: Diagnosis not present

## 2016-08-21 DIAGNOSIS — E109 Type 1 diabetes mellitus without complications: Secondary | ICD-10-CM | POA: Diagnosis not present

## 2016-08-21 DIAGNOSIS — Z0100 Encounter for examination of eyes and vision without abnormal findings: Secondary | ICD-10-CM | POA: Diagnosis not present

## 2016-08-28 ENCOUNTER — Ambulatory Visit (INDEPENDENT_AMBULATORY_CARE_PROVIDER_SITE_OTHER): Payer: Medicare HMO | Admitting: Physician Assistant

## 2016-08-28 ENCOUNTER — Other Ambulatory Visit: Payer: Self-pay

## 2016-08-28 ENCOUNTER — Encounter: Payer: Self-pay | Admitting: Physician Assistant

## 2016-08-28 ENCOUNTER — Other Ambulatory Visit
Admission: RE | Admit: 2016-08-28 | Discharge: 2016-08-28 | Disposition: A | Discharge status: Home / Self Care | Payer: Medicare HMO | Source: Ambulatory Visit | Attending: Physician Assistant | Admitting: Physician Assistant

## 2016-08-28 VITALS — BP 126/72 | HR 64 | Temp 98.3°F | Resp 16 | Ht 59.0 in | Wt 223.0 lb

## 2016-08-28 DIAGNOSIS — E1143 Type 2 diabetes mellitus with diabetic autonomic (poly)neuropathy: Secondary | ICD-10-CM | POA: Diagnosis not present

## 2016-08-28 DIAGNOSIS — Z0000 Encounter for general adult medical examination without abnormal findings: Secondary | ICD-10-CM

## 2016-08-28 DIAGNOSIS — E7800 Pure hypercholesterolemia, unspecified: Secondary | ICD-10-CM | POA: Insufficient documentation

## 2016-08-28 DIAGNOSIS — K5900 Constipation, unspecified: Secondary | ICD-10-CM

## 2016-08-28 DIAGNOSIS — I10 Essential (primary) hypertension: Secondary | ICD-10-CM | POA: Diagnosis not present

## 2016-08-28 DIAGNOSIS — E1142 Type 2 diabetes mellitus with diabetic polyneuropathy: Secondary | ICD-10-CM | POA: Diagnosis not present

## 2016-08-28 LAB — CBC WITH DIFFERENTIAL/PLATELET
Basophils Absolute: 0.1 10*3/uL (ref 0–0.1)
Basophils Relative: 1 %
Eosinophils Absolute: 0 10*3/uL (ref 0–0.7)
Eosinophils Relative: 1 %
HCT: 35 % (ref 35.0–47.0)
Hemoglobin: 12 g/dL (ref 12.0–16.0)
Lymphocytes Relative: 32 %
Lymphs Abs: 2.3 10*3/uL (ref 1.0–3.6)
MCH: 29.8 pg (ref 26.0–34.0)
MCHC: 34.2 g/dL (ref 32.0–36.0)
MCV: 87.2 fL (ref 80.0–100.0)
Monocytes Absolute: 0.5 10*3/uL (ref 0.2–0.9)
Monocytes Relative: 7 %
Neutro Abs: 4.3 10*3/uL (ref 1.4–6.5)
Neutrophils Relative %: 59 %
Platelets: 234 10*3/uL (ref 150–440)
RBC: 4.02 MIL/uL (ref 3.80–5.20)
RDW: 13.8 % (ref 11.5–14.5)
WBC: 7.3 10*3/uL (ref 3.6–11.0)

## 2016-08-28 LAB — COMPREHENSIVE METABOLIC PANEL
ALT: 49 U/L (ref 14–54)
AST: 25 U/L (ref 15–41)
Albumin: 3.4 g/dL — ABNORMAL LOW (ref 3.5–5.0)
Alkaline Phosphatase: 46 U/L (ref 38–126)
Anion gap: 6 (ref 5–15)
BUN: 15 mg/dL (ref 6–20)
CO2: 29 mmol/L (ref 22–32)
Calcium: 9.1 mg/dL (ref 8.9–10.3)
Chloride: 102 mmol/L (ref 101–111)
Creatinine, Ser: 0.66 mg/dL (ref 0.44–1.00)
GFR calc Af Amer: >60 mL/min (ref >60)
GFR calc non Af Amer: >60 mL/min (ref >60)
Glucose, Bld: 139 mg/dL — ABNORMAL HIGH (ref 65–99)
Potassium: 3.8 mmol/L (ref 3.5–5.1)
Sodium: 137 mmol/L (ref 135–145)
Total Bilirubin: 0.4 mg/dL (ref 0.3–1.2)
Total Protein: 7.4 g/dL (ref 6.5–8.1)

## 2016-08-28 LAB — POCT URINALYSIS DIPSTICK
Bilirubin, UA: NEGATIVE
Glucose, UA: NEGATIVE
Ketones, UA: NEGATIVE
Leukocytes, UA: NEGATIVE
Nitrite, UA: NEGATIVE
Spec Grav, UA: 1.02 (ref 1.030–1.035)
Urobilinogen, UA: 0.2 (ref ?–2.0)
pH, UA: 6 (ref 5.0–8.0)

## 2016-08-28 LAB — LIPID PANEL
Cholesterol: 155 mg/dL (ref 0–200)
HDL: 30 mg/dL — ABNORMAL LOW (ref >40)
LDL Cholesterol: 104 mg/dL — ABNORMAL HIGH (ref 0–99)
Total CHOL/HDL Ratio: 5.2 RATIO
Triglycerides: 103 mg/dL (ref <150)
VLDL: 21 mg/dL (ref 0–40)

## 2016-08-28 MED ORDER — SPIRONOLACTONE 25 MG PO TABS: 25.0000 mg | ORAL_TABLET | Freq: Every day | ORAL | 1 refills | Status: DC

## 2016-08-28 NOTE — Patient Instructions (Signed)
Health Maintenance for Postmenopausal Women Menopause is a normal process in which your reproductive ability comes to an end. This process happens gradually over a span of months to years, usually between the ages of 33 and 38. Menopause is complete when you have missed 12 consecutive menstrual periods. It is important to talk with your health care provider about some of the most common conditions that affect postmenopausal women, such as heart disease, cancer, and bone loss (osteoporosis). Adopting a healthy lifestyle and getting preventive care can help to promote your health and wellness. Those actions can also lower your chances of developing some of these common conditions. What should I know about menopause? During menopause, you may experience a number of symptoms, such as:  Moderate-to-severe hot flashes.  Night sweats.  Decrease in sex drive.  Mood swings.  Headaches.  Tiredness.  Irritability.  Memory problems.  Insomnia. Choosing to treat or not to treat menopausal changes is an individual decision that you make with your health care provider. What should I know about hormone replacement therapy and supplements? Hormone therapy products are effective for treating symptoms that are associated with menopause, such as hot flashes and night sweats. Hormone replacement carries certain risks, especially as you become older. If you are thinking about using estrogen or estrogen with progestin treatments, discuss the benefits and risks with your health care provider. What should I know about heart disease and stroke? Heart disease, heart attack, and stroke become more likely as you age. This may be due, in part, to the hormonal changes that your body experiences during menopause. These can affect how your body processes dietary fats, triglycerides, and cholesterol. Heart attack and stroke are both medical emergencies. There are many things that you can do to help prevent heart disease  and stroke:  Have your blood pressure checked at least every 1-2 years. High blood pressure causes heart disease and increases the risk of stroke.  If you are 48-61 years old, ask your health care provider if you should take aspirin to prevent a heart attack or a stroke.  Do not use any tobacco products, including cigarettes, chewing tobacco, or electronic cigarettes. If you need help quitting, ask your health care provider.  It is important to eat a healthy diet and maintain a healthy weight.  Be sure to include plenty of vegetables, fruits, low-fat dairy products, and lean protein.  Avoid eating foods that are high in solid fats, added sugars, or salt (sodium).  Get regular exercise. This is one of the most important things that you can do for your health.  Try to exercise for at least 150 minutes each week. The type of exercise that you do should increase your heart rate and make you sweat. This is known as moderate-intensity exercise.  Try to do strengthening exercises at least twice each week. Do these in addition to the moderate-intensity exercise.  Know your numbers.Ask your health care provider to check your cholesterol and your blood glucose. Continue to have your blood tested as directed by your health care provider. What should I know about cancer screening? There are several types of cancer. Take the following steps to reduce your risk and to catch any cancer development as early as possible. Breast Cancer  Practice breast self-awareness.  This means understanding how your breasts normally appear and feel.  It also means doing regular breast self-exams. Let your health care provider know about any changes, no matter how small.  If you are 40 or older,  have a clinician do a breast exam (clinical breast exam or CBE) every year. Depending on your age, family history, and medical history, it may be recommended that you also have a yearly breast X-ray (mammogram).  If you  have a family history of breast cancer, talk with your health care provider about genetic screening.  If you are at high risk for breast cancer, talk with your health care provider about having an MRI and a mammogram every year.  Breast cancer (BRCA) gene test is recommended for women who have family members with BRCA-related cancers. Results of the assessment will determine the need for genetic counseling and BRCA1 and for BRCA2 testing. BRCA-related cancers include these types:  Breast. This occurs in males or females.  Ovarian.  Tubal. This may also be called fallopian tube cancer.  Cancer of the abdominal or pelvic lining (peritoneal cancer).  Prostate.  Pancreatic. Cervical, Uterine, and Ovarian Cancer  Your health care provider may recommend that you be screened regularly for cancer of the pelvic organs. These include your ovaries, uterus, and vagina. This screening involves a pelvic exam, which includes checking for microscopic changes to the surface of your cervix (Pap test).  For women ages 21-65, health care providers may recommend a pelvic exam and a Pap test every three years. For women ages 23-65, they may recommend the Pap test and pelvic exam, combined with testing for human papilloma virus (HPV), every five years. Some types of HPV increase your risk of cervical cancer. Testing for HPV may also be done on women of any age who have unclear Pap test results.  Other health care providers may not recommend any screening for nonpregnant women who are considered low risk for pelvic cancer and have no symptoms. Ask your health care provider if a screening pelvic exam is right for you.  If you have had past treatment for cervical cancer or a condition that could lead to cancer, you need Pap tests and screening for cancer for at least 20 years after your treatment. If Pap tests have been discontinued for you, your risk factors (such as having a new sexual partner) need to be reassessed  to determine if you should start having screenings again. Some women have medical problems that increase the chance of getting cervical cancer. In these cases, your health care provider may recommend that you have screening and Pap tests more often.  If you have a family history of uterine cancer or ovarian cancer, talk with your health care provider about genetic screening.  If you have vaginal bleeding after reaching menopause, tell your health care provider.  There are currently no reliable tests available to screen for ovarian cancer. Lung Cancer  Lung cancer screening is recommended for adults 99-83 years old who are at high risk for lung cancer because of a history of smoking. A yearly low-dose CT scan of the lungs is recommended if you:  Currently smoke.  Have a history of at least 30 pack-years of smoking and you currently smoke or have quit within the past 15 years. A pack-year is smoking an average of one pack of cigarettes per day for one year. Yearly screening should:  Continue until it has been 15 years since you quit.  Stop if you develop a health problem that would prevent you from having lung cancer treatment. Colorectal Cancer  This type of cancer can be detected and can often be prevented.  Routine colorectal cancer screening usually begins at age 72 and continues  through age 75.  If you have risk factors for colon cancer, your health care provider may recommend that you be screened at an earlier age.  If you have a family history of colorectal cancer, talk with your health care provider about genetic screening.  Your health care provider may also recommend using home test kits to check for hidden blood in your stool.  A small camera at the end of a tube can be used to examine your colon directly (sigmoidoscopy or colonoscopy). This is done to check for the earliest forms of colorectal cancer.  Direct examination of the colon should be repeated every 5-10 years until  age 75. However, if early forms of precancerous polyps or small growths are found or if you have a family history or genetic risk for colorectal cancer, you may need to be screened more often. Skin Cancer  Check your skin from head to toe regularly.  Monitor any moles. Be sure to tell your health care provider:  About any new moles or changes in moles, especially if there is a change in a mole's shape or color.  If you have a mole that is larger than the size of a pencil eraser.  If any of your family members has a history of skin cancer, especially at a young age, talk with your health care provider about genetic screening.  Always use sunscreen. Apply sunscreen liberally and repeatedly throughout the day.  Whenever you are outside, protect yourself by wearing long sleeves, pants, a wide-brimmed hat, and sunglasses. What should I know about osteoporosis? Osteoporosis is a condition in which bone destruction happens more quickly than new bone creation. After menopause, you may be at an increased risk for osteoporosis. To help prevent osteoporosis or the bone fractures that can happen because of osteoporosis, the following is recommended:  If you are 19-50 years old, get at least 1,000 mg of calcium and at least 600 mg of vitamin D per day.  If you are older than age 50 but younger than age 70, get at least 1,200 mg of calcium and at least 600 mg of vitamin D per day.  If you are older than age 70, get at least 1,200 mg of calcium and at least 800 mg of vitamin D per day. Smoking and excessive alcohol intake increase the risk of osteoporosis. Eat foods that are rich in calcium and vitamin D, and do weight-bearing exercises several times each week as directed by your health care provider. What should I know about how menopause affects my mental health? Depression may occur at any age, but it is more common as you become older. Common symptoms of depression include:  Low or sad  mood.  Changes in sleep patterns.  Changes in appetite or eating patterns.  Feeling an overall lack of motivation or enjoyment of activities that you previously enjoyed.  Frequent crying spells. Talk with your health care provider if you think that you are experiencing depression. What should I know about immunizations? It is important that you get and maintain your immunizations. These include:  Tetanus, diphtheria, and pertussis (Tdap) booster vaccine.  Influenza every year before the flu season begins.  Pneumonia vaccine.  Shingles vaccine. Your health care provider may also recommend other immunizations. This information is not intended to replace advice given to you by your health care provider. Make sure you discuss any questions you have with your health care provider. Document Released: 07/18/2005 Document Revised: 12/14/2015 Document Reviewed: 02/27/2015 Elsevier Interactive Patient   Education  2017 [employer] Inc[employer]  

## 2016-08-28 NOTE — Telephone Encounter (Signed)
Had CPE today. Last refill was 11/15/2015. Renaldo Fiddler, CMA

## 2016-08-28 NOTE — Progress Notes (Signed)
Patient: Alexis Lloyd, Female    DOB: 1960/12/24, 56 y.o.   MRN: [id number] Visit Date: 08/28/2016  Today's Provider: Mar Daring, PA-C   Chief Complaint  Patient presents with  . Annual Exam  . Malodorous Urine   Subjective:    Annual physical exam Alexis Lloyd is a 56 y.o. female who presents today for health maintenance and complete physical. She feels well. She reports exercising daily. Walks for 15-20 minutes. She reports she is sleeping well.  Last CPE-08/05/2016 Last mammogram- 02/15/2015- H/O breast ca. F/B [facility] Last pap- 08/02/2014- WNL Last colonoscopy- Pt reports she had a colonoscopy at [facility] when she turned 56 YO. ----------------------------------------------------------------- Malodorous Urine Pt has noticed malodorous urine for 2 weeks. Pt denies dysuria, frequency, urgency. Pt has a H/O this problem. Pt states it is caused by infection by her diabetes. Pt is wondering if she needs to see a specialist for this problem.  Abdominal Pain Pt is c/o LUQ pain for several years, which has been worsening. Pt is concerned that this is a hernia. Pt also notes constipation, but this is unrelated. Pt was started on Bentyl for this problem in the past, but she never filled the prescription due to possible side effects. Pt also tried taking Miralax BID for this, but does not like taking Miralax so often. She has stopped this.  Review of Systems  Constitutional: Negative.   HENT: Negative.   Eyes: Negative.   Respiratory: Negative.   Cardiovascular: Negative.   Gastrointestinal: Positive for abdominal pain and constipation. Negative for abdominal distention, anal bleeding, blood in stool, diarrhea, nausea, rectal pain and vomiting.  Endocrine: Negative.   Genitourinary: Negative.   Musculoskeletal: Negative.   Skin: Negative.   Allergic/Immunologic: Negative.   Neurological: Negative.   Hematological: Negative.   Psychiatric/Behavioral: Negative.      Social History      She  reports that she quit smoking about 19 years ago. She has a 5.25 pack-year smoking history. She has never used smokeless tobacco. She reports that she does not drink alcohol or use drugs.       Social History   Social History  . Marital status: Married    Spouse name: Elberta Fortis  . Number of children: 2  . Years of education: some college   Occupational History  .  Disabled   Social History Main Topics  . Smoking status: Former Smoker    Packs/day: 0.25    Years: 21.00    Quit date: 06/08/1997  . Smokeless tobacco: Never Used  . Alcohol use No  . Drug use: No  . Sexual activity: Not Asked   Other Topics Concern  . None   Social History Narrative  . None    Past Medical History:  Diagnosis Date  . Allergy   . Breast cancer ([place])   . Breast cancer, left breast ([place]) 10/04/2014  . Diabetes mellitus without complication ([place])   . GERD (gastroesophageal reflux disease)   . Hyperlipidemia   . Hypertension   . Osteoporosis   . Sleep apnea      Patient Active Problem List   Diagnosis Date Noted  . Foot pain 07/16/2015  . Diabetes ([place]) 07/13/2015  . Abnormal finding on mammography, microcalcification 04/25/2015  . Intraductal carcinoma of breast 04/24/2015  . Asthmatic bronchitis 01/19/2015  . Bronchitis 01/19/2015  . Abnormal breast finding 11/18/2014  . Airway hyperreactivity 11/18/2014  . Body mass index of 60 or higher 11/18/2014  .  Breast cancer, female ([place]) 11/18/2014  . Breast cyst 11/18/2014  . Diabetic neuropathy ([place]) 11/18/2014  . Acid reflux 11/18/2014  . 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 Hypercholesteremia 11/18/2014  . BP (high blood pressure) 11/18/2014  . Adiposity 11/18/2014  . Arthritis, degenerative 11/18/2014  . Allergic rhinitis, seasonal 11/18/2014  . Apnea, sleep 11/18/2014  . AC (acromioclavicular) arthritis 11/08/2014  . Breast cancer, left breast ([place]) 10/04/2014  . Arthritis of knee, degenerative  11/14/2013    Past Surgical History:  Procedure Laterality Date  . ABDOMINAL HYSTERECTOMY     due to fibroid tumors 1990 Dr. Quenten Raven  . BREAST BIOPSY Left    negative  . BREAST BIOPSY Left    04/2013 positive  . BREAST CYST ASPIRATION Left   . BREAST EXCISIONAL BIOPSY Left    2015  . BREAST LUMPECTOMY Left 06/17/2013  . BREAST REDUCTION SURGERY Bilateral   . CESAREAN SECTION     x's 2  . CHOLECYSTECTOMY  09/2001  . REDUCTION MAMMAPLASTY    . s/p radiation therapy Left    2015    Family History        Family Status  Relation Status  . Mother Deceased at age 87   colon cancer  . Brother Alive  . Father Deceased   MI  . Sister Deceased at age 57 days old   heart disorder  . Brother Deceased at age 43   stroke  . Brother Deceased at age 88   stomach cancer  . Maternal Grandmother         Her family history includes Allergic rhinitis in her brother; Asthma in her brother; Breast cancer in her maternal grandmother; Breast cancer (age of onset: 53) in her mother; Cancer in her mother; Hypertension in her brother and mother.     Allergies  Allergen Reactions  . Allegra [Fexofenadine] Nausea And Vomiting  . Avelox [Moxifloxacin] Other (See Comments)    Hallucinations  . Bactrim [Sulfamethoxazole-Trimethoprim] Other (See Comments)    unknown  . Etodolac Nausea Only  . Penicillins Diarrhea and Nausea And Vomiting  . Sulfa Antibiotics Other (See Comments)    unknown  . Macrobid [employer] Macro] Other (See Comments)    Constipation and globus hystericus.     Current Outpatient Prescriptions:  .  ACCU-CHEK FASTCLIX LANCETS MISC, To check blood sugars daily.  DX: E11.9, Disp: 102 each, Rfl: 3 .  albuterol (PROVENTIL HFA) 108 (90 Base) MCG/ACT inhaler, Inhale 2 puffs into the lungs every 6 (six) hours as needed., Disp: 3 Inhaler, Rfl: 1 .  Blood Glucose Monitoring Suppl (ACCU-CHEK AVIVA PLUS) w/Device KIT, USE  DAILY, Disp: 1 kit, Rfl: 0 .  cetirizine  (ZYRTEC) 10 MG tablet, Take 1 tablet (10 mg total) by mouth daily., Disp: 90 tablet, Rfl: 1 .  Cholecalciferol (D-5000) 5000 UNITS TABS, Take 1 tablet by mouth daily. , Disp: , Rfl:  .  lisinopril-hydrochlorothiazide (PRINZIDE,ZESTORETIC) 20-12.5 MG tablet, TAKE ONE TABLET BY MOUTH ONCE DAILY, Disp: 90 tablet, Rfl: 1 .  lovastatin (MEVACOR) 20 MG tablet, TAKE TWO TABLETS BY MOUTH DAILY, Disp: 60 tablet, Rfl: 5 .  meloxicam (MOBIC) 15 MG tablet, Take 1 tablet (15 mg total) by mouth daily., Disp: 90 tablet, Rfl: 1 .  metFORMIN (GLUCOPHAGE) 1000 MG tablet, Take 1 tablet (1,000 mg total) by mouth 2 (two) times daily with a meal., Disp: 180 tablet, Rfl: 1 .  mometasone-formoterol (DULERA) 100-5 MCG/ACT AERO, Inhale 2 puffs into the lungs daily., Disp: 39 g, Rfl:  1 .  montelukast (SINGULAIR) 10 MG tablet, Take 1 tablet (10 mg total) by mouth daily., Disp: 90 tablet, Rfl: 3 .  OMEGA-3 FATTY ACIDS PO, Take 1 tablet by mouth daily. , Disp: , Rfl:  .  Omeprazole 20 MG TBEC, Take 1 tablet (20 mg total) by mouth daily., Disp: 90 each, Rfl: 1 .  sitaGLIPtin (JANUVIA) 100 MG tablet, Take 1 tablet (100 mg total) by mouth daily., Disp: 90 tablet, Rfl: 1 .  spironolactone (ALDACTONE) 25 MG tablet, Take 1 tablet (25 mg total) by mouth once., Disp: 90 tablet, Rfl: 1 .  tamoxifen (NOLVADEX) 10 MG tablet, Take 1 tablet (10 mg total) by mouth daily., Disp: 90 tablet, Rfl: 1 .  verapamil (CALAN) 80 MG tablet, Take 1 tablet (80 mg total) by mouth 2 (two) times daily., Disp: 60 tablet, Rfl: 3   Patient Care Team: Mar Daring, PA-C as PCP - General (Family Medicine)      Objective:   Vitals: BP 126/72 (BP Location: Right Arm, Patient Position: Sitting, Cuff Size: Large)   Pulse 64   Temp 98.3 F (36.8 C) (Oral)   Resp 16   Ht '4\' 11"'$  (1.499 m)   Wt 223 lb (101.2 kg)   BMI 45.04 kg/m    Vitals:   08/28/16 0916  BP: 126/72  Pulse: 64  Resp: 16  Temp: 98.3 F (36.8 C)  TempSrc: Oral  Weight: 223 lb  (101.2 kg)  Height: '4\' 11"'$  (1.499 m)     Physical Exam  Constitutional: She is oriented to person, place, and time. She appears well-developed and well-nourished. No distress.  HENT:  Head: Normocephalic and atraumatic.  Right Ear: Hearing, tympanic membrane, external ear and ear canal normal.  Left Ear: Hearing, tympanic membrane, external ear and ear canal normal.  Nose: Nose normal.  Mouth/Throat: Uvula is midline, oropharynx is clear and moist and mucous membranes are normal. No oropharyngeal exudate.  Eyes: Conjunctivae and EOM are normal. Pupils are equal, round, and reactive to light. Right eye exhibits no discharge. Left eye exhibits no discharge. No scleral icterus.  Neck: Normal range of motion. Neck supple. No JVD present. Carotid bruit is not present. No tracheal deviation present. No thyromegaly present.  Cardiovascular: Normal rate, regular rhythm, normal heart sounds and intact distal pulses.  Exam reveals no gallop and no friction rub.   No murmur heard. Pulmonary/Chest: Effort normal and breath sounds normal. No respiratory distress. She has no wheezes. She has no rales. She exhibits no tenderness. Right breast exhibits no inverted nipple, no mass, no nipple discharge, no skin change and no tenderness. Left breast exhibits no inverted nipple, no mass, no nipple discharge, no skin change and no tenderness. Breasts are symmetrical.  Abdominal: Soft. Bowel sounds are normal. She exhibits no distension and no mass. There is no tenderness. There is no rebound and no guarding.  Musculoskeletal: Normal range of motion. She exhibits no edema or tenderness.  Lymphadenopathy:    She has no cervical adenopathy.  Neurological: She is alert and oriented to person, place, and time.  Skin: Skin is warm and dry. No rash noted. She is not diaphoretic.  Psychiatric: She has a normal mood and affect. Her behavior is normal. Judgment and thought content normal.  Vitals reviewed.  Diabetic Foot  Exam - Simple   Simple Foot Form Diabetic Foot exam was performed with the following findings:  Yes 08/28/2016  2:30 PM  Visual Inspection No deformities, no ulcerations, no other skin  breakdown bilaterally:  Yes Sensation Testing See comments:  Yes Pulse Check Posterior Tibialis and Dorsalis pulse intact bilaterally:  Yes Comments Dulled sensation of monofilament bilaterally to the ankles     Depression Screen PHQ 2/9 Scores 08/28/2016 08/06/2015  PHQ - 2 Score 0 0  PHQ- 9 Score 0 0      Assessment & Plan:     Routine Health Maintenance and Physical Exam  Exercise Activities and Dietary recommendations Goals    None      Immunization History  Administered Date(s) Administered  . Pneumococcal Polysaccharide-23 08/06/2015    Health Maintenance  Topic Date Due  . Hepatitis C Screening  02-01-1961  . OPHTHALMOLOGY EXAM  10/04/1968  . HIV Screening  10/04/1973  . TETANUS/TDAP  10/04/1977  . MAMMOGRAM  10/04/2008  . COLONOSCOPY  10/04/2008  . FOOT EXAM  07/15/2016  . HEMOGLOBIN A1C  09/02/2016  . PAP SMEAR  08/02/2017  . PNEUMOCOCCAL POLYSACCHARIDE VACCINE (2) 08/05/2020  . INFLUENZA VACCINE  Addressed     Discussed health benefits of physical activity, and encouraged her to engage in regular exercise appropriate for her age and condition.    1. Medicare annual wellness visit, subsequent Normal physical exam today. Screenings WNL.  2. Type 2 diabetes mellitus with diabetic polyneuropathy, without long-term current use of insulin (HCC) UA normal today. Continue metformin 1047m BID, Januvia 1084mdaily. Will check labs as below and f/u pending results. - POCT Urinalysis Dipstick - CBC w/Diff/Platelet - Comprehensive Metabolic Panel (CMET) - Lipid Profile - HgB A1c  3. Essential hypertension Stable. Continue lisinopril-hctz 20-12.87m30mnd verapamil 53m37mill check labs as below and f/u pending results. - CBC w/Diff/Platelet - Comprehensive Metabolic Panel  (CMET) - Lipid Profile - HgB A1c  4. Diabetic autonomic neuropathy associated with type 2 diabetes mellitus (HCC) Stable. Will check labs as below and f/u pending results. - CBC w/Diff/Platelet - Comprehensive Metabolic Panel (CMET) - Lipid Profile - HgB A1c  5. Hypercholesteremia Stable. Continue lovastatin 20mg52mll check labs as below and f/u pending results. - CBC w/Diff/Platelet - Comprehensive Metabolic Panel (CMET) - Lipid Profile - HgB A1c  6. Constipation, unspecified constipation type Using miralax daily.   --------------------------------------------------------------------  Patient seen and examined by JenniMar DaringC, and note scribed by EmilyRenaldo Fiddler.  JenniMar DaringC  [facility]

## 2016-08-29 ENCOUNTER — Telehealth: Payer: Self-pay

## 2016-08-29 LAB — HEMOGLOBIN A1C
Hgb A1c MFr Bld: 8.2 % — ABNORMAL HIGH (ref 4.8–5.6)
Mean Plasma Glucose: 189 mg/dL

## 2016-08-29 NOTE — Telephone Encounter (Signed)
Advised pt of lab results. Pt verbally acknowledges understanding. Pt is taking medications as prescribed. FU appointment scheduled. Renaldo Fiddler, CMA

## 2016-08-29 NOTE — Telephone Encounter (Signed)
-----   Message from Mar Daring, PA-C sent at 08/29/2016  8:24 AM EDT ----- A1c increased to 8.2 from 7.8. Make sure to be taking all diabetic medications. Continue working on lifestyle changes including limiting carbs and sugars from diet. All other labs are stable and WNL. Would like to recheck A1c in 3 months.

## 2016-09-03 ENCOUNTER — Other Ambulatory Visit: Payer: Self-pay

## 2016-09-03 ENCOUNTER — Ambulatory Visit: Payer: Medicare Other | Admitting: Physician Assistant

## 2016-09-03 DIAGNOSIS — R69 Illness, unspecified: Secondary | ICD-10-CM | POA: Diagnosis not present

## 2016-09-03 DIAGNOSIS — E1142 Type 2 diabetes mellitus with diabetic polyneuropathy: Secondary | ICD-10-CM

## 2016-09-03 MED ORDER — ACCU-CHEK FASTCLIX LANCETS MISC: 3 refills | Status: DC

## 2016-09-03 NOTE — Telephone Encounter (Signed)
Pharmacy requesting refills. Thanks!  

## 2016-09-04 ENCOUNTER — Telehealth: Payer: Self-pay | Admitting: Physician Assistant

## 2016-09-04 DIAGNOSIS — E1142 Type 2 diabetes mellitus with diabetic polyneuropathy: Secondary | ICD-10-CM

## 2016-09-04 NOTE — Telephone Encounter (Signed)
Pt said she never got a call back regarding her UC.  Also she said she needs to get a new ACCUCHEK meter needles and test strips.  She uses [facility] [address]\  thanks [employer]

## 2016-09-04 NOTE — Telephone Encounter (Signed)
Spoke with patient and advised that on 03/22 we checked her urine a did a urinalysis dipstick and was normal but we didn't send the urine for culture because it didn't showed any bacteria. Per patient she is still having a "nasty" smell and that she gets frequent UTI and wants to be refer to the Urologist.  She is also request the accu check meter needles and test strips to be send to [employer].  Thanks,  -Joseline

## 2016-09-05 MED ORDER — GLUCOSE BLOOD VI STRP: ORAL_STRIP | 12 refills | Status: DC

## 2016-09-05 MED ORDER — ACCU-CHEK FASTCLIX LANCETS MISC: 3 refills | Status: DC

## 2016-09-05 NOTE — Telephone Encounter (Signed)
Accucheck test strips and lancets sent in.  I would recommend her come for a pelvic exam to make sure doesn't have yeast or BV before a referral to Urology

## 2016-09-05 NOTE — Telephone Encounter (Signed)
Pt advised.  Apt made for Monday 09/08/2016  Thanks,   -Mickel Baas

## 2016-09-05 NOTE — Telephone Encounter (Signed)
LMTCB  Thanks,  -Teala Daffron 

## 2016-09-08 ENCOUNTER — Encounter: Payer: Self-pay | Admitting: Physician Assistant

## 2016-09-08 ENCOUNTER — Ambulatory Visit (INDEPENDENT_AMBULATORY_CARE_PROVIDER_SITE_OTHER): Payer: Medicare HMO | Admitting: Physician Assistant

## 2016-09-08 VITALS — BP 108/70 | HR 70 | Temp 98.1°F | Resp 16 | Wt 220.4 lb

## 2016-09-08 DIAGNOSIS — N760 Acute vaginitis: Secondary | ICD-10-CM | POA: Diagnosis not present

## 2016-09-08 DIAGNOSIS — R8290 Unspecified abnormal findings in urine: Secondary | ICD-10-CM

## 2016-09-08 DIAGNOSIS — N3000 Acute cystitis without hematuria: Secondary | ICD-10-CM | POA: Diagnosis not present

## 2016-09-08 DIAGNOSIS — B9689 Other specified bacterial agents as the cause of diseases classified elsewhere: Secondary | ICD-10-CM | POA: Diagnosis not present

## 2016-09-08 DIAGNOSIS — N898 Other specified noninflammatory disorders of vagina: Secondary | ICD-10-CM | POA: Diagnosis not present

## 2016-09-08 DIAGNOSIS — R69 Illness, unspecified: Secondary | ICD-10-CM | POA: Diagnosis not present

## 2016-09-08 LAB — POCT URINALYSIS DIPSTICK
Bilirubin, UA: NEGATIVE
Glucose, UA: NEGATIVE
Ketones, UA: NEGATIVE
Leukocytes, UA: NEGATIVE
Nitrite, UA: NEGATIVE
Protein, UA: NEGATIVE
Spec Grav, UA: 1.025 (ref 1.030–1.035)
Urobilinogen, UA: 0.2 (ref ?–2.0)
pH, UA: 6.5 (ref 5.0–8.0)

## 2016-09-08 MED ORDER — METRONIDAZOLE 500 MG PO TABS
500.0000 mg | ORAL_TABLET | Freq: Two times a day (BID) | ORAL | 0 refills | Status: DC
Start: 2016-09-08 — End: 2016-11-18

## 2016-09-08 NOTE — Patient Instructions (Signed)

## 2016-09-08 NOTE — Progress Notes (Signed)
Patient: Alexis Lloyd Female    DOB: Mar 29, 1961   56 y.o.   MRN: [id number] Visit Date: 09/08/2016  Today's Provider: Mar Daring, PA-C   No chief complaint on file.  Subjective:    HPI Patient is here today with c/o of bad odor in her urine. She had called requesting to be refer to Urology for frequent UTI. She denies Dysuria,Hematuria,Burning, lower abdomen pain when she urinates.    Allergies  Allergen Reactions  . Allegra [Fexofenadine] Nausea And Vomiting  . Avelox [Moxifloxacin] Other (See Comments)    Hallucinations  . Bactrim [Sulfamethoxazole-Trimethoprim] Other (See Comments)    unknown  . Etodolac Nausea Only  . Penicillins Diarrhea and Nausea And Vomiting  . Sulfa Antibiotics Other (See Comments)    unknown  . Macrobid [employer] Macro] Other (See Comments)    Constipation and globus hystericus.     Current Outpatient Prescriptions:  .  ACCU-CHEK FASTCLIX LANCETS MISC, To check blood sugars daily.  DX: E11.9, Disp: 102 each, Rfl: 3 .  albuterol (PROVENTIL HFA) 108 (90 Base) MCG/ACT inhaler, Inhale 2 puffs into the lungs every 6 (six) hours as needed., Disp: 3 Inhaler, Rfl: 1 .  Blood Glucose Monitoring Suppl (ACCU-CHEK AVIVA PLUS) w/Device KIT, USE  DAILY, Disp: 1 kit, Rfl: 0 .  cetirizine (ZYRTEC) 10 MG tablet, Take 1 tablet (10 mg total) by mouth daily., Disp: 90 tablet, Rfl: 1 .  Cholecalciferol (D-5000) 5000 UNITS TABS, Take 1 tablet by mouth daily. , Disp: , Rfl:  .  glucose blood test strip, To check blood sugar daily, Disp: 100 each, Rfl: 12 .  lisinopril-hydrochlorothiazide (PRINZIDE,ZESTORETIC) 20-12.5 MG tablet, TAKE ONE TABLET BY MOUTH ONCE DAILY, Disp: 90 tablet, Rfl: 1 .  lovastatin (MEVACOR) 20 MG tablet, TAKE TWO TABLETS BY MOUTH DAILY, Disp: 60 tablet, Rfl: 5 .  meloxicam (MOBIC) 15 MG tablet, Take 1 tablet (15 mg total) by mouth daily., Disp: 90 tablet, Rfl: 1 .  metFORMIN (GLUCOPHAGE) 1000 MG tablet, Take 1 tablet  (1,000 mg total) by mouth 2 (two) times daily with a meal., Disp: 180 tablet, Rfl: 1 .  mometasone-formoterol (DULERA) 100-5 MCG/ACT AERO, Inhale 2 puffs into the lungs daily., Disp: 39 g, Rfl: 1 .  montelukast (SINGULAIR) 10 MG tablet, Take 1 tablet (10 mg total) by mouth daily., Disp: 90 tablet, Rfl: 3 .  OMEGA-3 FATTY ACIDS PO, Take 1 tablet by mouth daily. , Disp: , Rfl:  .  Omeprazole 20 MG TBEC, Take 1 tablet (20 mg total) by mouth daily., Disp: 90 each, Rfl: 1 .  sitaGLIPtin (JANUVIA) 100 MG tablet, Take 1 tablet (100 mg total) by mouth daily., Disp: 90 tablet, Rfl: 1 .  spironolactone (ALDACTONE) 25 MG tablet, Take 1 tablet (25 mg total) by mouth daily., Disp: 90 tablet, Rfl: 1 .  tamoxifen (NOLVADEX) 10 MG tablet, Take 1 tablet (10 mg total) by mouth daily., Disp: 90 tablet, Rfl: 1 .  verapamil (CALAN) 80 MG tablet, Take 1 tablet (80 mg total) by mouth 2 (two) times daily., Disp: 60 tablet, Rfl: 3  Review of Systems  Constitutional: Negative.   Respiratory: Negative.   Cardiovascular: Negative for chest pain, palpitations and leg swelling.  Gastrointestinal: Negative for abdominal pain.  Genitourinary: Positive for vaginal discharge. Negative for dysuria, hematuria and urgency.    Social History  Substance Use Topics  . Smoking status: Former Smoker    Packs/day: 0.25    Years: 21.00  Quit date: 06/08/1997  . Smokeless tobacco: Never Used  . Alcohol use No   Objective:   BP 108/70 (BP Location: Right Arm, Patient Position: Sitting, Cuff Size: Large)   Pulse 70   Temp 98.1 F (36.7 C) (Oral)   Resp 16   Wt 220 lb 6.4 oz (100 kg)   BMI 44.52 kg/m    Physical Exam  Constitutional: She is oriented to person, place, and time. She appears well-developed and well-nourished. No distress.  Cardiovascular: Normal rate, regular rhythm and normal heart sounds.  Exam reveals no gallop and no friction rub.   No murmur heard. Pulmonary/Chest: Effort normal and breath sounds  normal. No respiratory distress. She has no wheezes. She has no rales.  Abdominal: Soft. Normal appearance and bowel sounds are normal. She exhibits no distension and no mass. There is no hepatosplenomegaly. There is no tenderness. There is no rebound, no guarding and no CVA tenderness.  Genitourinary: There is no rash, tenderness, lesion or injury on the right labia. There is no rash, tenderness, lesion or injury on the left labia. No erythema or tenderness in the vagina. No signs of injury around the vagina. Vaginal discharge (scant, white, milky discharge noted that is malodorous) found.  Neurological: She is alert and oriented to person, place, and time.  Skin: Skin is warm and dry. She is not diaphoretic.  Vitals reviewed.      Assessment & Plan:     1. BV (bacterial vaginosis) High suspicion of BV due to consistency and odor. Metronidazole given as below. Will adjust treatment as needed per Nuswab results.  - metroNIDAZOLE (FLAGYL) 500 MG tablet; Take 1 tablet (500 mg total) by mouth 2 (two) times daily.  Dispense: 14 tablet; Refill: 0  2. Bad odor of urine UA still negative. Will send for urine culture to make sure no infection noted. NuSwab also sent to evaluate vaginal discharge but high suspicion of BV which was treated as above.  - POCT urinalysis dipstick - NuSwab Vaginitis Plus (VG+) - Urine Culture  3. Vaginal discharge See above medical treatment plan. - NuSwab Vaginitis Plus (VG+) - Urine Culture       Mar Daring, PA-C  [facility]

## 2016-09-10 ENCOUNTER — Telehealth: Payer: Self-pay | Admitting: Physician Assistant

## 2016-09-10 DIAGNOSIS — E1142 Type 2 diabetes mellitus with diabetic polyneuropathy: Secondary | ICD-10-CM

## 2016-09-10 DIAGNOSIS — R69 Illness, unspecified: Secondary | ICD-10-CM | POA: Diagnosis not present

## 2016-09-10 LAB — URINE CULTURE

## 2016-09-10 MED ORDER — ONETOUCH ULTRA SYSTEM W/DEVICE KIT: PACK | 0 refills | Status: DC

## 2016-09-10 MED ORDER — GLUCOSE BLOOD VI STRP: ORAL_STRIP | 12 refills | Status: DC

## 2016-09-10 MED ORDER — ONETOUCH ULTRASOFT LANCETS MISC: 12 refills | Status: DC

## 2016-09-10 NOTE — Telephone Encounter (Signed)
Request to switch patient's meter due to coverage to onetouch.

## 2016-09-11 LAB — NUSWAB VAGINITIS PLUS (VG+)
Candida albicans, NAA: NEGATIVE
Candida glabrata, NAA: NEGATIVE
Chlamydia trachomatis, NAA: NEGATIVE
Neisseria gonorrhoeae, NAA: NEGATIVE
Trich vag by NAA: NEGATIVE

## 2016-09-11 MED ORDER — CEFUROXIME AXETIL 250 MG PO TABS: 250.0000 mg | ORAL_TABLET | Freq: Two times a day (BID) | ORAL | 0 refills | Status: DC

## 2016-09-11 NOTE — Addendum Note (Signed)
Addended by: Mar Daring on: 09/11/2016 11:35 AM   Modules accepted: Orders

## 2016-10-20 DIAGNOSIS — D508 Other iron deficiency anemias: Secondary | ICD-10-CM | POA: Diagnosis not present

## 2016-10-20 DIAGNOSIS — D0512 Intraductal carcinoma in situ of left breast: Secondary | ICD-10-CM | POA: Diagnosis not present

## 2016-11-04 ENCOUNTER — Other Ambulatory Visit: Payer: Self-pay | Admitting: Physician Assistant

## 2016-11-04 DIAGNOSIS — I10 Essential (primary) hypertension: Secondary | ICD-10-CM

## 2016-11-18 ENCOUNTER — Emergency Department: Payer: Medicare HMO

## 2016-11-18 ENCOUNTER — Emergency Department
Admission: EM | Admit: 2016-11-18 | Discharge: 2016-11-18 | Disposition: A | Discharge status: Home / Self Care | Payer: Medicare HMO | Attending: Emergency Medicine | Admitting: Emergency Medicine

## 2016-11-18 ENCOUNTER — Encounter: Payer: Self-pay | Admitting: Emergency Medicine

## 2016-11-18 DIAGNOSIS — Z87891 Personal history of nicotine dependence: Secondary | ICD-10-CM | POA: Insufficient documentation

## 2016-11-18 DIAGNOSIS — R52 Pain, unspecified: Secondary | ICD-10-CM

## 2016-11-18 DIAGNOSIS — M79604 Pain in right leg: Secondary | ICD-10-CM | POA: Diagnosis present

## 2016-11-18 DIAGNOSIS — Z79899 Other long term (current) drug therapy: Secondary | ICD-10-CM | POA: Diagnosis not present

## 2016-11-18 DIAGNOSIS — Z7984 Long term (current) use of oral hypoglycemic drugs: Secondary | ICD-10-CM | POA: Diagnosis not present

## 2016-11-18 DIAGNOSIS — E1140 Type 2 diabetes mellitus with diabetic neuropathy, unspecified: Secondary | ICD-10-CM | POA: Insufficient documentation

## 2016-11-18 DIAGNOSIS — C50912 Malignant neoplasm of unspecified site of left female breast: Secondary | ICD-10-CM | POA: Insufficient documentation

## 2016-11-18 HISTORY — DX: Unspecified osteoarthritis, unspecified site: M19.90

## 2016-11-18 MED ORDER — IBUPROFEN 600 MG PO TABS: 600.0000 mg | ORAL_TABLET | Freq: Three times a day (TID) | ORAL | 0 refills | Status: DC | PRN

## 2016-11-18 MED ORDER — IBUPROFEN 600 MG PO TABS
600.0000 mg | ORAL_TABLET | Freq: Once | ORAL | Status: AC
  Administered 2016-11-18: 600 mg via ORAL
  Filled 2016-11-18: qty 1

## 2016-11-18 NOTE — ED Provider Notes (Signed)
 [facility] Emergency Department Provider Note  ____________________________________________   First MD Initiated Contact with Patient 11/18/16 1527     (approximate)  I have reviewed the triage vital signs and the nursing notes.   HISTORY  Chief Complaint Leg Injury    HPI Alexis Lloyd is a 56 y.o. female is here with complaint of right leg pain. Patient states that she may have pulled a muscle in her leg last week but the pain is not improved. Patient has tried some over-the-counter medication without improvement. Patient states that most of the pain is in the posterior portion of her right leg. She denies any previous DVTs. Patient has been ambulatory but states that is increased pain with weightbearing. Patient denies any recent injury to her leg or any prolonged traveling. Patient rates her pain as a 9/10 at present.   Past Medical History:  Diagnosis Date  . Allergy   . Arthritis   . Breast cancer (HCC)   . Breast cancer, left breast (HCC) 10/04/2014  . Diabetes mellitus without complication (HCC)   . GERD (gastroesophageal reflux disease)   . Hyperlipidemia   . Hypertension   . Osteoporosis   . Sleep apnea     Patient Active Problem List   Diagnosis Date Noted  . Foot pain 07/16/2015  . Diabetes (HCC) 07/13/2015  . Abnormal finding on mammography, microcalcification 04/25/2015  . Intraductal carcinoma of breast 04/24/2015  . Asthmatic bronchitis 01/19/2015  . Airway hyperreactivity 11/18/2014  . Body mass index of 60 or higher 11/18/2014  . Breast cyst 11/18/2014  . Diabetic neuropathy (HCC) 11/18/2014  . Acid reflux 11/18/2014  . 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 Hypercholesteremia 11/18/2014  . BP (high blood pressure) 11/18/2014  . Adiposity 11/18/2014  . Arthritis, degenerative 11/18/2014  . Allergic rhinitis, seasonal 11/18/2014  . Apnea, sleep 11/18/2014  . AC (acromioclavicular) arthritis 11/08/2014  . Breast cancer,  left breast (HCC) 10/04/2014  . 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 ABDOMINAL HYSTERECTOMY     due to fibroid tumors 1990 Dr. DeFrancisco  . BREAST BIOPSY Left    negative  . BREAST BIOPSY Left    04/2013 positive  . BREAST CYST ASPIRATION Left   . BREAST EXCISIONAL BIOPSY Left    2015  . BREAST LUMPECTOMY Left 06/17/2013  . BREAST REDUCTION SURGERY Bilateral   . CESAREAN SECTION     x's 2  . CHOLECYSTECTOMY  09/2001  . REDUCTION MAMMAPLASTY    . s/p radiation therapy Left    2015    Prior to Admission medications   Medication Sig Start Date End Date Taking? Authorizing Provider  albuterol (PROVENTIL HFA) 108 (90 Base) MCG/ACT inhaler Inhale 2 puffs into the lungs every 6 (six) hours as needed. 11/15/15   Maloney, Nancy, MD  Blood Glucose Monitoring Suppl (ONE TOUCH ULTRA SYSTEM KIT) w/Device KIT To check blood sugar daily 09/10/16   Burnette, Jennifer M, PA-C  cetirizine (ZYRTEC) 10 MG tablet Take 1 tablet (10 mg total) by mouth daily. 07/24/15   Maloney, Nancy, MD  Cholecalciferol (D-5000) 5000 UNITS TABS Take 1 tablet by mouth daily.     [provider]  glucose blood (ONE TOUCH ULTRA TEST) test strip To check blood sugar daily 09/10/16   Burnette, Jennifer M, PA-C  ibuprofen (ADVIL,MOTRIN) 600 MG tablet Take 1 tablet (600 mg total) by mouth every 8 (eight) hours as needed. 11/18/16   Summers,   Rhonda L, PA-C  Lancets (ONETOUCH ULTRASOFT) lancets To check blood sugar daily 09/10/16   Burnette, Jennifer M, PA-C  lisinopril-hydrochlorothiazide (PRINZIDE,ZESTORETIC) 20-12.5 MG tablet TAKE ONE TABLET BY MOUTH ONCE DAILY 11/04/16   Burnette, Jennifer M, PA-C  lovastatin (MEVACOR) 20 MG tablet TAKE TWO TABLETS BY MOUTH DAILY 07/27/16   Burnette, Jennifer M, PA-C  meloxicam (MOBIC) 15 MG tablet Take 1 tablet (15 mg total) by mouth daily. 11/15/15   Maloney, Nancy, MD  metFORMIN (GLUCOPHAGE) 1000 MG tablet Take 1 tablet (1,000 mg  total) by mouth 2 (two) times daily with a meal. 08/20/16   Burnette, Jennifer M, PA-C  mometasone-formoterol (DULERA) 100-5 MCG/ACT AERO Inhale 2 puffs into the lungs daily. 11/15/15   Maloney, Nancy, MD  montelukast (SINGULAIR) 10 MG tablet Take 1 tablet (10 mg total) by mouth daily. 11/15/15   Maloney, Nancy, MD  OMEGA-3 FATTY ACIDS PO Take 1 tablet by mouth daily.     [provider]  Omeprazole 20 MG TBEC Take 1 tablet (20 mg total) by mouth daily. 11/16/15   Burnette, Jennifer M, PA-C  sitaGLIPtin (JANUVIA) 100 MG tablet Take 1 tablet (100 mg total) by mouth daily. 11/15/15   Maloney, Nancy, MD  spironolactone (ALDACTONE) 25 MG tablet Take 1 tablet (25 mg total) by mouth daily. 08/28/16   Burnette, Jennifer M, PA-C  tamoxifen (NOLVADEX) 10 MG tablet Take 1 tablet (10 mg total) by mouth daily. 11/15/15   Maloney, Nancy, MD  verapamil (CALAN) 80 MG tablet Take 1 tablet (80 mg total) by mouth 2 (two) times daily. 12/03/15   Burnette, Jennifer M, PA-C    Allergies Allegra [fexofenadine]; Avelox [moxifloxacin]; Bactrim [sulfamethoxazole-trimethoprim]; Etodolac; Penicillins; Sulfa antibiotics; and Macrobid [nitrofurantoin monohyd macro]  Family History  Problem Relation Age of Onset  . Hypertension Mother   . Cancer Mother        breast and rectal cancer  . Breast cancer Mother 60  . Asthma Brother   . Hypertension Brother   . Allergic rhinitis Brother   . Breast cancer Maternal Grandmother     Social History Social History  Substance Use Topics  . Smoking status: Former Smoker    Packs/day: 0.25    Years: 21.00    Quit date: 06/08/1997  . Smokeless tobacco: Never Used  . Alcohol use No    Review of Systems  Constitutional: No fever/chills Cardiovascular: Denies chest pain. Respiratory: Denies shortness of breath. Gastrointestinal: No abdominal pain.  No nausea, no vomiting.   Musculoskeletal: Positive for right leg pain. Skin: Negative for rash. Neurological: Negative for  headaches, focal weakness or numbness.   ____________________________________________   PHYSICAL EXAM:  VITAL SIGNS: ED Triage Vitals  Enc Vitals Group     BP 11/18/16 1500 101/66     Pulse Rate 11/18/16 1500 66     Resp 11/18/16 1500 16     Temp 11/18/16 1500 97.9 F (36.6 C)     Temp Source 11/18/16 1500 Oral     SpO2 11/18/16 1500 98 %     Weight 11/18/16 1459 220 lb (99.8 kg)     Height 11/18/16 1459 4' 11" (1.499 m)     Head Circumference --      Peak Flow --      Pain Score 11/18/16 1459 9     Pain Loc --      Pain Edu? --      Excl. in GC? --     Constitutional: Alert and oriented. Well appearing and   in no acute distress. Eyes: Conjunctivae are normal. PERRL. EOMI. Head: Atraumatic. Neck: No stridor.   Cardiovascular: Normal rate, regular rhythm. Grossly normal heart sounds.  Good peripheral circulation. Respiratory: Normal respiratory effort.  No retractions. Lungs CTAB. Musculoskeletal: Examination of the right leg there is some minimal soft tissue edema but no erythema or warmth was noted. There is generalized tenderness on palpation of the posterior leg from the calf into the upper thigh. Patient range of motion is decreased secondary to discomfort. Pulses present. Skin is intact. No ecchymosis or abrasions were seen. Neurologic:  Normal speech and language. No gross focal neurologic deficits are appreciated. No gait instability. Skin:  Skin is warm, dry and intact.  Psychiatric: Mood and affect are normal. Speech and behavior are normal.  ____________________________________________   LABS (all labs ordered are listed, but only abnormal results are displayed)  Labs Reviewed - No data to display  RADIOLOGY  US Venous Img Lower Unilateral Right  Result Date: 11/18/2016 CLINICAL DATA:  Right lower extremity pain for 2 days. History of breast cancer. EXAM: Right LOWER EXTREMITY VENOUS DOPPLER ULTRASOUND TECHNIQUE: Gray-scale sonography with graded  compression, as well as color Doppler and duplex ultrasound were performed to evaluate the lower extremity deep venous systems from the level of the common femoral vein and including the common femoral, femoral, profunda femoral, popliteal and calf veins including the posterior tibial, peroneal and gastrocnemius veins when visible. The superficial great saphenous vein was also interrogated. Spectral Doppler was utilized to evaluate flow at rest and with distal augmentation maneuvers in the common femoral, femoral and popliteal veins. COMPARISON:  None. FINDINGS: Contralateral Common Femoral Vein: Respiratory phasicity is normal and symmetric with the symptomatic side. No evidence of thrombus. Normal compressibility. Common Femoral Vein: No evidence of thrombus. Normal compressibility, respiratory phasicity and response to augmentation. Saphenofemoral Junction: No evidence of thrombus. Normal compressibility and flow on color Doppler imaging. Profunda Femoral Vein: No evidence of thrombus. Normal compressibility and flow on color Doppler imaging. Femoral Vein: No evidence of thrombus. Normal compressibility, respiratory phasicity and response to augmentation. Popliteal Vein: No evidence of thrombus. Normal compressibility, respiratory phasicity and response to augmentation. Calf Veins: No evidence of thrombus. Normal compressibility and flow on color Doppler imaging. Superficial Great Saphenous Vein: No evidence of thrombus. Normal compressibility and flow on color Doppler imaging. Venous Reflux:  None. Other Findings:  None. IMPRESSION: No evidence of DVT within the right lower extremity. Electronically Signed   By: Van Clines M.D.   On: 11/18/2016 16:51    ____________________________________________   PROCEDURES  Procedure(s) performed: None  Procedures  Critical Care performed: No  ____________________________________________   INITIAL IMPRESSION / ASSESSMENT AND PLAN / ED  COURSE  Pertinent labs & imaging results that were available during my care of the patient were reviewed by me and considered in my medical decision making (see chart for details).  Patient was given information about her venous ultrasound being negative for DVT. We discussed most likely this is a muscle skeletal-type pain. She will follow-up with her PCP if any continued problems. She states that the ibuprofen that she was given prior to her SOUND has given her relief. She was given a prescription for ibuprofen 600 mg 3 times a day with food. She is encouraged to use ice or heat to her leg as needed for comfort. She is also placed in a knee immobilizer for added support.      ____________________________________________   FINAL CLINICAL IMPRESSION(S) / ED DIAGNOSES  Final diagnoses:  Pain  Leg pain, posterior, right      NEW MEDICATIONS STARTED DURING THIS VISIT:  Discharge Medication List as of 11/18/2016  5:06 PM    START taking these medications   Details  ibuprofen (ADVIL,MOTRIN) 600 MG tablet Take 1 tablet (600 mg total) by mouth every 8 (eight) hours as needed., Starting Tue 11/18/2016, Print         Note:  This document was prepared using Dragon voice recognition software and may include unintentional dictation errors.    Johnn Hai, PA-C 11/18/16 [place], [place], MD 11/18/16 2044

## 2016-11-18 NOTE — ED Triage Notes (Signed)
C/O right leg pain.  States "I believe I pulled a ligament in my right leg".  States injured leg last week, but pain has not improved.  C/O pain from right knee up to right buttock

## 2016-11-18 NOTE — Discharge Instructions (Signed)
Apply ice or heat to your leg as needed for comfort. Wear knee immobilizer for exercise up for. If not improving follow up with your primary care doctor for further evaluation. Today's tests shows that he did not have a blood clot in your leg.

## 2016-11-18 NOTE — ED Notes (Signed)
See triage note  States she had possible injury to right knee last week  then went to reach for her husband and felt a "pop"   States pain is posterior right knee and moves up in upper leg  Ambulates with limp d/t pain

## 2016-11-22 ENCOUNTER — Other Ambulatory Visit: Payer: Self-pay | Admitting: Family Medicine

## 2016-12-01 ENCOUNTER — Ambulatory Visit (INDEPENDENT_AMBULATORY_CARE_PROVIDER_SITE_OTHER): Payer: Medicare HMO | Admitting: Physician Assistant

## 2016-12-01 ENCOUNTER — Encounter: Payer: Self-pay | Admitting: Physician Assistant

## 2016-12-01 VITALS — BP 160/82 | HR 67 | Temp 98.0°F | Resp 16 | Wt 228.8 lb

## 2016-12-01 DIAGNOSIS — Z853 Personal history of malignant neoplasm of breast: Secondary | ICD-10-CM | POA: Diagnosis not present

## 2016-12-01 DIAGNOSIS — Z6841 Body Mass Index (BMI) 40.0 and over, adult: Secondary | ICD-10-CM | POA: Diagnosis not present

## 2016-12-01 DIAGNOSIS — W57XXXA Bitten or stung by nonvenomous insect and other nonvenomous arthropods, initial encounter: Secondary | ICD-10-CM | POA: Diagnosis not present

## 2016-12-01 DIAGNOSIS — E1142 Type 2 diabetes mellitus with diabetic polyneuropathy: Secondary | ICD-10-CM | POA: Diagnosis not present

## 2016-12-01 DIAGNOSIS — E7800 Pure hypercholesterolemia, unspecified: Secondary | ICD-10-CM | POA: Diagnosis not present

## 2016-12-01 DIAGNOSIS — I10 Essential (primary) hypertension: Secondary | ICD-10-CM

## 2016-12-01 LAB — POCT GLYCOSYLATED HEMOGLOBIN (HGB A1C)
Est. average glucose Bld gHb Est-mCnc: 180
Hemoglobin A1C: 7.9

## 2016-12-01 MED ORDER — LOVASTATIN 20 MG PO TABS: 20.0000 mg | ORAL_TABLET | Freq: Every day | ORAL | 5 refills | Status: DC

## 2016-12-01 MED ORDER — METFORMIN HCL 1000 MG PO TABS: 1000.0000 mg | ORAL_TABLET | Freq: Every day | ORAL | 1 refills | Status: DC

## 2016-12-01 NOTE — Patient Instructions (Signed)
Calorie Counting for Weight Loss Calories are units of energy. Your body needs a certain amount of calories from food to keep you going throughout the day. When you eat more calories than your body needs, your body stores the extra calories as fat. When you eat fewer calories than your body needs, your body burns fat to get the energy it needs. Calorie counting means keeping track of how many calories you eat and drink each day. Calorie counting can be helpful if you need to lose weight. If you make sure to eat fewer calories than your body needs, you should lose weight. Ask your health care provider what a healthy weight is for you. For calorie counting to work, you will need to eat the right number of calories in a day in order to lose a healthy amount of weight per week. A dietitian can help you determine how many calories you need in a day and will give you suggestions on how to reach your calorie goal.  A healthy amount of weight to lose per week is usually 1-2 lb (0.5-0.9 kg). This usually means that your daily calorie intake should be reduced by 500-750 calories.  Eating 1,200 - 1,500 calories per day can help most women lose weight.  Eating 1,500 - 1,800 calories per day can help most men lose weight.  What is my plan? My goal is to have 1200-1400 calories per day. If I have this many calories per day, I should lose around 1-2 pounds per week. What do I need to know about calorie counting? In order to meet your daily calorie goal, you will need to:  Find out how many calories are in each food you would like to eat. Try to do this before you eat.  Decide how much of the food you plan to eat.  Write down what you ate and how many calories it had. Doing this is called keeping a food log.  To successfully lose weight, it is important to balance calorie counting with a healthy lifestyle that includes regular activity. Aim for 150 minutes of moderate exercise (such as walking) or 75 minutes  of vigorous exercise (such as running) each week. Where do I find calorie information?  The number of calories in a food can be found on a Nutrition Facts label. If a food does not have a Nutrition Facts label, try to look up the calories online or ask your dietitian for help. Remember that calories are listed per serving. If you choose to have more than one serving of a food, you will have to multiply the calories per serving by the amount of servings you plan to eat. For example, the label on a package of bread might say that a serving size is 1 slice and that there are 90 calories in a serving. If you eat 1 slice, you will have eaten 90 calories. If you eat 2 slices, you will have eaten 180 calories. How do I keep a food log? Immediately after each meal, record the following information in your food log:  What you ate. Don't forget to include toppings, sauces, and other extras on the food.  How much you ate. This can be measured in cups, ounces, or number of items.  How many calories each food and drink had.  The total number of calories in the meal.  Keep your food log near you, such as in a small notebook in your pocket, or use a mobile app or website. Some  programs will calculate calories for you and show you how many calories you have left for the day to meet your goal. What are some calorie counting tips?  Use your calories on foods and drinks that will fill you up and not leave you hungry: ? Some examples of foods that fill you up are nuts and nut butters, vegetables, lean proteins, and high-fiber foods like whole grains. High-fiber foods are foods with more than 5 g fiber per serving. ? Drinks such as sodas, specialty coffee drinks, alcohol, and juices have a lot of calories, yet do not fill you up.  Eat nutritious foods and avoid empty calories. Empty calories are calories you get from foods or beverages that do not have many vitamins or protein, such as candy, sweets, and soda. It  is better to have a nutritious high-calorie food (such as an avocado) than a food with few nutrients (such as a bag of chips).  Know how many calories are in the foods you eat most often. This will help you calculate calorie counts faster.  Pay attention to calories in drinks. Low-calorie drinks include water and unsweetened drinks.  Pay attention to nutrition labels for "low fat" or "fat free" foods. These foods sometimes have the same amount of calories or more calories than the full fat versions. They also often have added sugar, starch, or salt, to make up for flavor that was removed with the fat.  Find a way of tracking calories that works for you. Get creative. Try different apps or programs if writing down calories does not work for you. What are some portion control tips?  Know how many calories are in a serving. This will help you know how many servings of a certain food you can have.  Use a measuring cup to measure serving sizes. You could also try weighing out portions on a kitchen scale. With time, you will be able to estimate serving sizes for some foods.  Take some time to put servings of different foods on your favorite plates, bowls, and cups so you know what a serving looks like.  Try not to eat straight from a bag or box. Doing this can lead to overeating. Put the amount you would like to eat in a cup or on a plate to make sure you are eating the right portion.  Use smaller plates, glasses, and bowls to prevent overeating.  Try not to multitask (for example, watch TV or use your computer) while eating. If it is time to eat, sit down at a table and enjoy your food. This will help you to know when you are full. It will also help you to be aware of what you are eating and how much you are eating. What are tips for following this plan? Reading food labels  Check the calorie count compared to the serving size. The serving size may be smaller than what you are used to  eating.  Check the source of the calories. Make sure the food you are eating is high in vitamins and protein and low in saturated and trans fats. Shopping  Read nutrition labels while you shop. This will help you make healthy decisions before you decide to purchase your food.  Make a grocery list and stick to it. Cooking  Try to cook your favorite foods in a healthier way. For example, try baking instead of frying.  Use low-fat dairy products. Meal planning  Use more fruits and vegetables. Half of your plate should  be fruits and vegetables.  Include lean proteins like poultry and fish. How do I count calories when eating out?  Ask for smaller portion sizes.  Consider sharing an entree and sides instead of getting your own entree.  If you get your own entree, eat only half. Ask for a box at the beginning of your meal and put the rest of your entree in it so you are not tempted to eat it.  If calories are listed on the menu, choose the lower calorie options.  Choose dishes that include vegetables, fruits, whole grains, low-fat dairy products, and lean protein.  Choose items that are boiled, broiled, grilled, or steamed. Stay away from items that are buttered, battered, fried, or served with cream sauce. Items labeled "crispy" are usually fried, unless stated otherwise.  Choose water, low-fat milk, unsweetened iced tea, or other drinks without added sugar. If you want an alcoholic beverage, choose a lower calorie option such as a glass of wine or light beer.  Ask for dressings, sauces, and syrups on the side. These are usually high in calories, so you should limit the amount you eat.  If you want a salad, choose a garden salad and ask for grilled meats. Avoid extra toppings like bacon, cheese, or fried items. Ask for the dressing on the side, or ask for olive oil and vinegar or lemon to use as dressing.  Estimate how many servings of a food you are given. For example, a serving of  cooked rice is  cup or about the size of half a baseball. Knowing serving sizes will help you be aware of how much food you are eating at restaurants. The list below tells you how big or small some common portion sizes are based on everyday objects: ? 1 oz-4 stacked dice. ? 3 oz-1 deck of cards. ? 1 tsp-1 die. ? 1 Tbsp- a ping-pong ball. ? 2 Tbsp-1 ping-pong ball. ?  cup- baseball. ? 1 cup-1 baseball. Summary  Calorie counting means keeping track of how many calories you eat and drink each day. If you eat fewer calories than your body needs, you should lose weight.  A healthy amount of weight to lose per week is usually 1-2 lb (0.5-0.9 kg). This usually means reducing your daily calorie intake by 500-750 calories.  The number of calories in a food can be found on a Nutrition Facts label. If a food does not have a Nutrition Facts label, try to look up the calories online or ask your dietitian for help.  Use your calories on foods and drinks that will fill you up, and not on foods and drinks that will leave you hungry.  Use smaller plates, glasses, and bowls to prevent overeating. This information is not intended to replace advice given to you by your health care provider. Make sure you discuss any questions you have with your health care provider. Document Released: 05/26/2005 Document Revised: 04/25/2016 Document Reviewed: 04/25/2016 Elsevier Interactive Patient Education  2017 [place] refers to food and lifestyle choices that are based on the traditions of countries located on the [employer]. This way of eating has been shown to help prevent certain conditions and improve outcomes for people who have chronic diseases, like kidney disease and heart disease. What are tips for following this plan? Lifestyle  Cook and eat meals together with your family, when possible.  Drink enough fluid to keep your urine clear or pale  yellow.  Be  physically active every day. This includes: ? Aerobic exercise like running or swimming. ? Leisure activities like gardening, walking, or housework.  Get 7-8 hours of sleep each night.  If recommended by your health care provider, drink red wine in moderation. This means 1 glass a day for nonpregnant women and 2 glasses a day for men. A glass of wine equals 5 oz (150 mL). Reading food labels  Check the serving size of packaged foods. For foods such as rice and pasta, the serving size refers to the amount of cooked product, not dry.  Check the total fat in packaged foods. Avoid foods that have saturated fat or trans fats.  Check the ingredients list for added sugars, such as corn syrup. Shopping  At the grocery store, buy most of your food from the areas near the walls of the store. This includes: ? Fresh fruits and vegetables (produce). ? Grains, beans, nuts, and seeds. Some of these may be available in unpackaged forms or large amounts (in bulk). ? Fresh seafood. ? Poultry and eggs. ? Low-fat dairy products.  Buy whole ingredients instead of prepackaged foods.  Buy fresh fruits and vegetables in-season from local farmers markets.  Buy frozen fruits and vegetables in resealable bags.  If you do not have access to quality fresh seafood, buy precooked frozen shrimp or canned fish, such as tuna, salmon, or sardines.  Buy small amounts of raw or cooked vegetables, salads, or olives from the deli or salad bar at your store.  Stock your pantry so you always have certain foods on hand, such as olive oil, canned tuna, canned tomatoes, rice, pasta, and beans. Cooking  Cook foods with extra-virgin olive oil instead of using butter or other vegetable oils.  Have meat as a side dish, and have vegetables or grains as your main dish. This means having meat in small portions or adding small amounts of meat to foods like pasta or stew.  Use beans or vegetables instead of meat in  common dishes like chili or lasagna.  Experiment with different cooking methods. Try roasting or broiling vegetables instead of steaming or sauteing them.  Add frozen vegetables to soups, stews, pasta, or rice.  Add nuts or seeds for added healthy fat at each meal. You can add these to yogurt, salads, or vegetable dishes.  Marinate fish or vegetables using olive oil, lemon juice, garlic, and fresh herbs. Meal planning  Plan to eat 1 vegetarian meal one day each week. Try to work up to 2 vegetarian meals, if possible.  Eat seafood 2 or more times a week.  Have healthy snacks readily available, such as: ? Vegetable sticks with hummus. ? [place] yogurt. ? Fruit and nut trail mix.  Eat balanced meals throughout the week. This includes: ? Fruit: 2-3 servings a day ? Vegetables: 4-5 servings a day ? Low-fat dairy: 2 servings a day ? Fish, poultry, or lean meat: 1 serving a day ? Beans and legumes: 2 or more servings a week ? Nuts and seeds: 1-2 servings a day ? Whole grains: 6-8 servings a day ? Extra-virgin olive oil: 3-4 servings a day  Limit red meat and sweets to only a few servings a month What are my food choices?  Mediterranean diet ? Recommended ? Grains: Whole-grain pasta. [place] rice. [place] wheat. Polenta. Couscous. Whole-wheat bread. [place]. ? Vegetables: Artichokes. Beets. Broccoli. Cabbage. Carrots. Eggplant. Green beans. Chard. Kale. Spinach. Onions. Leeks. Peas. Squash. Tomatoes. Peppers. Radishes. ? Fruits: Apples. Apricots. Avocado. Berries.  Bananas. Cherries. Dates. Figs. Grapes. Lemons. Melon. Oranges. Peaches. Plums. Pomegranate. ? Meats and other protein foods: Beans. Almonds. Sunflower seeds. Pine nuts. Peanuts. [place]. Salmon. Scallops. Shrimp. [place]. Tilapia. Clams. Oysters. Eggs. ? Dairy: Low-fat milk. Cheese. Greek yogurt. ? Beverages: Water. Red wine. Herbal tea. ? Fats and oils: Extra virgin olive oil. Avocado oil. Grape seed oil. ? Sweets and  desserts: [place] yogurt with honey. Baked apples. Poached pears. Trail mix. ? Seasoning and other foods: Basil. Cilantro. Coriander. Cumin. Mint. Parsley. Sage. Rosemary. Tarragon. Garlic. Oregano. Thyme. Pepper. Balsalmic vinegar. Tahini. Hummus. Tomato sauce. Olives. Mushrooms. ? Limit these ? Grains: Prepackaged pasta or rice dishes. Prepackaged cereal with added sugar. ? Vegetables: Deep fried potatoes (french fries). ? Fruits: Fruit canned in syrup. ? Meats and other protein foods: Beef. Pork. Lamb. Poultry with skin. Hot dogs. [place] Salines. ? Dairy: Ice cream. Sour cream. Whole milk. ? Beverages: Juice. Sugar-sweetened soft drinks. Beer. Liquor and spirits. ? Fats and oils: Butter. [place]ola oil. Vegetable oil. Beef fat (tallow). Lard. ? Sweets and desserts: Cookies. Cakes. Pies. Candy. ? Seasoning and other foods: Mayonnaise. Premade sauces and marinades. ? The items listed may not be a complete list. Talk with your dietitian about what dietary choices are right for you. Summary  The Mediterranean diet includes both food and lifestyle choices.  Eat a variety of fresh fruits and vegetables, beans, nuts, seeds, and whole grains.  Limit the amount of red meat and sweets that you eat.  Talk with your health care provider about whether it is safe for you to drink red wine in moderation. This means 1 glass a day for nonpregnant women and 2 glasses a day for men. A glass of wine equals 5 oz (150 mL). This information is not intended to replace advice given to you by your health care provider. Make sure you discuss any questions you have with your health care provider. Document Released: 01/17/2016 Document Revised: 02/19/2016 Document Reviewed: 01/17/2016 Elsevier Interactive Patient Education  [employer].

## 2016-12-01 NOTE — Progress Notes (Addendum)
Patient: Alexis Lloyd Female    DOB: [birth year]   56 y.o.   MRN: [id number] Visit Date: 12/01/2016  Today's Provider: Mar Daring, PA-C   Chief Complaint  Patient presents with  . Follow-up    Diabetes and Hypertension   Subjective:    HPI  Patient is here to follow-up diabetes and hypertension. She is having the following symptoms: Cramping on fingers, leg soreness and has been taking Meloxicam to help.   Diabetes Mellitus Type II, Follow-up:   Lab Results  Component Value Date   HGBA1C 7.9 12/01/2016   HGBA1C 8.2 (H) 08/28/2016   HGBA1C 7.8 03/05/2016    Last seen for diabetes 3 months ago.  Management since then includes continue Metformin 1000 MG BID and Januvia 100 Mg daily. She reports she is taking the Metformin once daily instead of BID. She reports compliance with treatment. She is not having side effects.  Current symptoms include polyuria and have been worsening. Home blood sugar records: former smoker  Episodes of hypoglycemia? no   Current Insulin Regimen: N/A Weight trend: increasing steadily Current diet: in general, an "unhealthy" diet Current exercise: housecleaning and no regular exercise  Pertinent Labs:    Component Value Date/Time   CHOL 155 08/28/2016 1050   CHOL 158 07/18/2015 0855   TRIG 103 08/28/2016 1050   HDL 30 (L) 08/28/2016 1050   HDL 27 (L) 07/18/2015 0855   LDLCALC 104 (H) 08/28/2016 1050   LDLCALC 95 07/18/2015 0855   CREATININE 0.66 08/28/2016 1050   CREATININE 0.75 04/14/2014 0925    Wt Readings from Last 3 Encounters:  12/01/16 228 lb 12.8 oz (103.8 kg)  11/18/16 220 lb (99.8 kg)  09/08/16 220 lb 6.4 oz (100 kg)    ------------------------------------------------------------------------  Hypertension, follow-up:  BP Readings from Last 3 Encounters:  12/01/16 (!) 160/82  11/18/16 (!) 171/64  09/08/16 108/70    She was last seen for hypertension 3 months ago.  BP at that visit was  126/72.Stable. Management since that visit includes continue Verapamil 11m and Lisinopril-HCTZ 20-12.5 MG. She reports that she is not able to make it to the bathroom on time when she takes Lisinopril-HCTZ 20-12.5 MG so she is not taking. She reports excellent compliance with treatment. She is not having side effects.  She is not adherent to low salt diet.   Outside blood pressures are n/a. She is experiencing fatigue and lower extremity edema.  Patient denies chest pain, chest pressure/discomfort, exertional chest pressure/discomfort, irregular heart beat, near-syncope and palpitations.   Cardiovascular risk factors include diabetes mellitus, hypertension, obesity (BMI >= 30 kg/m2) and smoking/ tobacco exposure.   Patient also with c/o lump on her back for a while. Itches. No pain. Tamoxifen will be discontinued in November 2018 as she will be 5 years from her breast cancer diagnosis.    Allergies  Allergen Reactions  . Allegra [Fexofenadine] Nausea And Vomiting  . Avelox [Moxifloxacin] Other (See Comments)    Hallucinations  . Bactrim [Sulfamethoxazole-Trimethoprim] Other (See Comments)    unknown  . Etodolac Nausea Only  . Fish-Derived Products Nausea And Vomiting    With "seafood"  . Penicillins Diarrhea and Nausea And Vomiting  . Sulfa Antibiotics Other (See Comments)    unknown  . Macrobid [W[employer] ResourcesMacro] Other (See Comments)    Constipation and globus hystericus.     Current Outpatient Prescriptions:  .  albuterol (PROVENTIL HFA) 108 (90 Base) MCG/ACT inhaler, Inhale 2 puffs into  the lungs every 6 (six) hours as needed., Disp: 3 Inhaler, Rfl: 1 .  Blood Glucose Monitoring Suppl (ONE TOUCH ULTRA SYSTEM KIT) w/Device KIT, To check blood sugar daily, Disp: 1 each, Rfl: 0 .  cetirizine (ZYRTEC) 10 MG tablet, Take 1 tablet (10 mg total) by mouth daily., Disp: 90 tablet, Rfl: 1 .  Cholecalciferol (D-5000) 5000 UNITS TABS, Take 1 tablet by mouth daily. , Disp: ,  Rfl:  .  glucose blood (ONE TOUCH ULTRA TEST) test strip, To check blood sugar daily, Disp: 100 each, Rfl: 12 .  ibuprofen (ADVIL,MOTRIN) 600 MG tablet, Take 1 tablet (600 mg total) by mouth every 8 (eight) hours as needed., Disp: 30 tablet, Rfl: 0 .  Lancets (ONETOUCH ULTRASOFT) lancets, To check blood sugar daily, Disp: 100 each, Rfl: 12 .  lisinopril-hydrochlorothiazide (PRINZIDE,ZESTORETIC) 20-12.5 MG tablet, TAKE ONE TABLET BY MOUTH ONCE DAILY, Disp: 90 tablet, Rfl: 1 .  lovastatin (MEVACOR) 20 MG tablet, TAKE TWO TABLETS BY MOUTH DAILY, Disp: 60 tablet, Rfl: 5 .  meloxicam (MOBIC) 15 MG tablet, Take 1 tablet (15 mg total) by mouth daily., Disp: 90 tablet, Rfl: 1 .  metFORMIN (GLUCOPHAGE) 1000 MG tablet, Take 1 tablet (1,000 mg total) by mouth 2 (two) times daily with a meal., Disp: 180 tablet, Rfl: 1 .  mometasone-formoterol (DULERA) 100-5 MCG/ACT AERO, Inhale 2 puffs into the lungs daily., Disp: 39 g, Rfl: 1 .  montelukast (SINGULAIR) 10 MG tablet, Take 1 tablet (10 mg total) by mouth daily., Disp: 90 tablet, Rfl: 3 .  OMEGA-3 FATTY ACIDS PO, Take 1 tablet by mouth daily. , Disp: , Rfl:  .  omeprazole (PRILOSEC) 20 MG capsule, TAKE ONE CAPSULE BY MOUTH ONCE DAILY, Disp: 30 capsule, Rfl: 5 .  sitaGLIPtin (JANUVIA) 100 MG tablet, Take 1 tablet (100 mg total) by mouth daily., Disp: 90 tablet, Rfl: 1 .  spironolactone (ALDACTONE) 25 MG tablet, Take 1 tablet (25 mg total) by mouth daily., Disp: 90 tablet, Rfl: 1 .  tamoxifen (NOLVADEX) 10 MG tablet, Take 1 tablet (10 mg total) by mouth daily., Disp: 90 tablet, Rfl: 1 .  verapamil (CALAN) 80 MG tablet, Take 1 tablet (80 mg total) by mouth 2 (two) times daily., Disp: 60 tablet, Rfl: 3  Review of Systems  Constitutional: Positive for fatigue.  Respiratory: Negative.   Cardiovascular: Positive for leg swelling (left leg and foot with soreness). Negative for chest pain and palpitations.  Gastrointestinal: Negative.   Endocrine: Positive for  polyuria.  Genitourinary: Negative.   Musculoskeletal: Negative.   Neurological: Negative for dizziness, light-headedness and headaches. Tremors: good.    Social History  Substance Use Topics  . Smoking status: Former Smoker    Packs/day: 0.25    Years: 21.00    Quit date: 06/08/1997  . Smokeless tobacco: Never Used  . Alcohol use No   Objective:   BP (!) 160/82 (BP Location: Right Wrist, Patient Position: Sitting, Cuff Size: Normal)   Pulse 67   Temp 98 F (36.7 C) (Oral)   Resp 16   Wt 228 lb 12.8 oz (103.8 kg)   SpO2 98%   BMI 46.21 kg/m    Physical Exam  Constitutional: She appears well-developed and well-nourished. No distress.  Neck: Normal range of motion. Neck supple. No JVD present. No tracheal deviation present. No thyromegaly present.  Cardiovascular: Normal rate, regular rhythm and normal heart sounds.  Exam reveals no gallop and no friction rub.   No murmur heard. Pulmonary/Chest: Effort normal and breath  sounds normal. No respiratory distress. She has no wheezes. She has no rales.  Musculoskeletal: She exhibits edema (1+ pitting).  Lymphadenopathy:    She has no cervical adenopathy.  Skin: She is not diaphoretic.     Vitals reviewed.      Assessment & Plan:     1. Type 2 diabetes mellitus with diabetic polyneuropathy, without long-term current use of insulin (HCC) A1c improved to 7.9. Discussed dieting habits. Patient is to continue Metformin 1040m once daily (patient is not compliant with twice daily dosing) and Januvia. She does require diabetic shoes and orthotics due to diabetes. I will see her back in 3 months to recheck will get all labs at that time.  - POCT glycosylated hemoglobin (Hb A1C) - metFORMIN (GLUCOPHAGE) 1000 MG tablet; Take 1 tablet (1,000 mg total) by mouth daily with breakfast.  Dispense: 90 tablet; Refill: 1  2. Hypercholesterolemia Patient is only taking once daily and reports she has been on once daily dosing for a long time,  has not been taking 2 tabs as directed. Cholesterol in March was fairly normal. Will recheck labs in 3 months. If cholesterol has increased will change to a 417mtab for better control.  - lovastatin (MEVACOR) 20 MG tablet; Take 1 tablet (20 mg total) by mouth daily.  Dispense: 30 tablet; Refill: 5  3. Bug bite, initial encounter No infection. Advised to use hydrocortisone cream.   4. BMI 45.0-49.9, adult ([place] information on mediterranean diet and calorie counting. Discussed limiting carbs.  5. Essential hypertension Not to goal. Patient has been non compliant with medications and reports that she has not taken any BP medications for the last 2 days. She is going to try to be more compliant with medications. She is to monitor BP at home. I will recheck in 3 months. If still up may consider taking away HCTZ as patient complains of urine frequency and leakage being a reason she does not want to take it, but she does not want to change it yet. If BP still elevated will switch to lisinopril 4047mlone and continue Verapamil 25m63md spironolactone.   6. Personal history of breast cancer Recently seen oncologist and was told that she will be taken off her tamoxifen in November this year as she will be 5 years since diagnosis.        JennMar Daring-C  [facility]

## 2016-12-02 DIAGNOSIS — Z0101 Encounter for examination of eyes and vision with abnormal findings: Secondary | ICD-10-CM | POA: Diagnosis not present

## 2016-12-15 DIAGNOSIS — M25561 Pain in right knee: Secondary | ICD-10-CM | POA: Diagnosis not present

## 2016-12-15 DIAGNOSIS — M170 Bilateral primary osteoarthritis of knee: Secondary | ICD-10-CM | POA: Diagnosis not present

## 2016-12-15 DIAGNOSIS — M25562 Pain in left knee: Secondary | ICD-10-CM | POA: Diagnosis not present

## 2016-12-23 ENCOUNTER — Telehealth: Payer: Self-pay | Admitting: Physician Assistant

## 2016-12-23 DIAGNOSIS — Z791 Long term (current) use of non-steroidal anti-inflammatories (NSAID): Secondary | ICD-10-CM | POA: Diagnosis not present

## 2016-12-23 DIAGNOSIS — M25561 Pain in right knee: Secondary | ICD-10-CM | POA: Diagnosis not present

## 2016-12-23 DIAGNOSIS — G8929 Other chronic pain: Secondary | ICD-10-CM | POA: Diagnosis not present

## 2016-12-23 DIAGNOSIS — M1711 Unilateral primary osteoarthritis, right knee: Secondary | ICD-10-CM | POA: Diagnosis not present

## 2016-12-23 DIAGNOSIS — Z7984 Long term (current) use of oral hypoglycemic drugs: Secondary | ICD-10-CM | POA: Diagnosis not present

## 2016-12-23 DIAGNOSIS — Z7951 Long term (current) use of inhaled steroids: Secondary | ICD-10-CM | POA: Diagnosis not present

## 2016-12-23 DIAGNOSIS — J45909 Unspecified asthma, uncomplicated: Secondary | ICD-10-CM | POA: Diagnosis not present

## 2016-12-23 DIAGNOSIS — Z79899 Other long term (current) drug therapy: Secondary | ICD-10-CM | POA: Diagnosis not present

## 2016-12-23 DIAGNOSIS — E1122 Type 2 diabetes mellitus with diabetic chronic kidney disease: Secondary | ICD-10-CM | POA: Diagnosis not present

## 2016-12-23 DIAGNOSIS — M25562 Pain in left knee: Secondary | ICD-10-CM | POA: Diagnosis not present

## 2016-12-23 DIAGNOSIS — Z79810 Long term (current) use of selective estrogen receptor modulators (SERMs): Secondary | ICD-10-CM | POA: Diagnosis not present

## 2016-12-23 DIAGNOSIS — M170 Bilateral primary osteoarthritis of knee: Secondary | ICD-10-CM | POA: Diagnosis not present

## 2016-12-23 DIAGNOSIS — N189 Chronic kidney disease, unspecified: Secondary | ICD-10-CM | POA: Diagnosis not present

## 2016-12-23 NOTE — Telephone Encounter (Signed)
Pt needs rx for her diabetic shoes.  Please call when it is ready [phone]  Thanks teri

## 2016-12-23 NOTE — Telephone Encounter (Signed)
Alexis Lloyd written Rx. Please notify patient ready for pick up.

## 2016-12-23 NOTE — Telephone Encounter (Signed)
LMTCB  Thanks,  -Alexis Lloyd 

## 2016-12-26 ENCOUNTER — Encounter: Payer: Self-pay | Admitting: Physician Assistant

## 2016-12-26 ENCOUNTER — Telehealth: Payer: Self-pay | Admitting: Physician Assistant

## 2016-12-26 NOTE — Telephone Encounter (Signed)
Pt is requesting a letter to give to Social Services so she can get help with paying her light bill.  Pt states this letter should state that she has asthma and has to use have air conditioning due to her health.  VV#[phone]/BO

## 2016-12-26 NOTE — Telephone Encounter (Signed)
Letter printed.

## 2016-12-26 NOTE — Telephone Encounter (Signed)
LM on voicemail that letter is placed upfront ready for pick up.  Thanks,  -Lyden Redner

## 2016-12-26 NOTE — Telephone Encounter (Signed)
Please review

## 2016-12-29 ENCOUNTER — Other Ambulatory Visit: Payer: Self-pay | Admitting: Physician Assistant

## 2016-12-29 DIAGNOSIS — E7800 Pure hypercholesterolemia, unspecified: Secondary | ICD-10-CM

## 2016-12-29 DIAGNOSIS — I10 Essential (primary) hypertension: Secondary | ICD-10-CM

## 2016-12-29 DIAGNOSIS — J40 Bronchitis, not specified as acute or chronic: Secondary | ICD-10-CM

## 2016-12-29 MED ORDER — MONTELUKAST SODIUM 10 MG PO TABS: 10.0000 mg | ORAL_TABLET | Freq: Every day | ORAL | 3 refills | Status: DC

## 2016-12-29 NOTE — Telephone Encounter (Signed)
[facility] faxed a request on the following medication.  Thanks CC  montelukast (SINGULAIR) 10 MG tablet  >Take one tablet by mouth once daily.

## 2017-01-08 DIAGNOSIS — M1711 Unilateral primary osteoarthritis, right knee: Secondary | ICD-10-CM | POA: Diagnosis not present

## 2017-01-15 ENCOUNTER — Other Ambulatory Visit
Admission: RE | Admit: 2017-01-15 | Discharge: 2017-01-15 | Disposition: A | Discharge status: Home / Self Care | Payer: Medicare HMO | Source: Ambulatory Visit | Attending: Physician Assistant | Admitting: Physician Assistant

## 2017-01-15 ENCOUNTER — Encounter: Payer: Self-pay | Admitting: Physician Assistant

## 2017-01-15 ENCOUNTER — Ambulatory Visit (INDEPENDENT_AMBULATORY_CARE_PROVIDER_SITE_OTHER): Payer: Medicare HMO | Admitting: Physician Assistant

## 2017-01-15 VITALS — BP 120/70 | HR 100 | Temp 97.8°F | Resp 16 | Wt 228.0 lb

## 2017-01-15 DIAGNOSIS — I10 Essential (primary) hypertension: Secondary | ICD-10-CM

## 2017-01-15 DIAGNOSIS — Z6841 Body Mass Index (BMI) 40.0 and over, adult: Secondary | ICD-10-CM

## 2017-01-15 DIAGNOSIS — R5383 Other fatigue: Secondary | ICD-10-CM | POA: Diagnosis not present

## 2017-01-15 DIAGNOSIS — M7672 Peroneal tendinitis, left leg: Secondary | ICD-10-CM

## 2017-01-15 DIAGNOSIS — Z713 Dietary counseling and surveillance: Secondary | ICD-10-CM

## 2017-01-15 DIAGNOSIS — E538 Deficiency of other specified B group vitamins: Secondary | ICD-10-CM

## 2017-01-15 DIAGNOSIS — L409 Psoriasis, unspecified: Secondary | ICD-10-CM | POA: Diagnosis not present

## 2017-01-15 LAB — CBC WITH DIFFERENTIAL/PLATELET
Basophils Absolute: 0.1 10*3/uL (ref 0–0.1)
Basophils Relative: 1 %
Eosinophils Absolute: 0.1 10*3/uL (ref 0–0.7)
Eosinophils Relative: 1 %
HCT: 38.2 % (ref 35.0–47.0)
Hemoglobin: 12.7 g/dL (ref 12.0–16.0)
Lymphocytes Relative: 41 %
Lymphs Abs: 3.7 10*3/uL — ABNORMAL HIGH (ref 1.0–3.6)
MCH: 28.9 pg (ref 26.0–34.0)
MCHC: 33.3 g/dL (ref 32.0–36.0)
MCV: 86.9 fL (ref 80.0–100.0)
Monocytes Absolute: 0.6 10*3/uL (ref 0.2–0.9)
Monocytes Relative: 7 %
Neutro Abs: 4.7 10*3/uL (ref 1.4–6.5)
Neutrophils Relative %: 50 %
Platelets: 277 10*3/uL (ref 150–440)
RBC: 4.4 MIL/uL (ref 3.80–5.20)
RDW: 14.1 % (ref 11.5–14.5)
WBC: 9.2 10*3/uL (ref 3.6–11.0)

## 2017-01-15 MED ORDER — TRIAMCINOLONE ACETONIDE 0.1 % EX CREA: 1.0000 "application " | TOPICAL_CREAM | Freq: Two times a day (BID) | CUTANEOUS | 0 refills | Status: DC

## 2017-01-15 MED ORDER — NALTREXONE-BUPROPION HCL ER 8-90 MG PO TB12: ORAL_TABLET | ORAL | 3 refills | Status: DC

## 2017-01-15 MED ORDER — LISINOPRIL-HYDROCHLOROTHIAZIDE 20-12.5 MG PO TABS: ORAL_TABLET | ORAL | 1 refills | Status: DC

## 2017-01-15 NOTE — Patient Instructions (Signed)
Bupropion; Naltrexone extended-release tablets What is this medicine? BUPROPION; NALTREXONE (byoo PROE pee on; nal TREX one) is a combination product used to promote and maintain weight loss in obese adults or overweight adults who also have weight related medical problems. This medicine should be used with a reduced calorie diet and increased physical activity. This medicine may be used for other purposes; ask your health care provider or pharmacist if you have questions. COMMON BRAND NAME(S): CONTRAVE What should I tell my health care provider before I take this medicine? They need to know if you have any of these conditions: -an eating disorder, such as anorexia or bulimia -bipolar disorder -diabetes -depression -drug abuse or addiction -glaucoma -head injury -heart disease -high blood pressure -history of a tumor or infection of your brain or spine -history of stroke -history of irregular heartbeat -if you often drink alcohol -kidney disease -liver disease -schizophrenia -seizures -suicidal thoughts, plans, or attempt; a previous suicide attempt by you or a family member -an unusual or allergic reaction to bupropion, naltrexone, other medicines, foods, dyes, or preservatives -breast-feeding -pregnant or trying to become pregnant How should I use this medicine? Take this medicine by mouth with a glass of water. Follow the directions on the prescription label. Take this medicine in the morning and in the evenings as directed by your healthcare professional. You can take it with or without food. Do not take with high-fat meals as this may increase your risk of seizures. Do not crush, chew, or cut these tablets. Do not take your medicine more often than directed. Do not stop taking this medicine suddenly except upon the advice of your doctor. A special MedGuide will be given to you by the pharmacist with each prescription and refill. Be sure to read this information carefully each  time. Talk to your pediatrician regarding the use of this medicine in children. Special care may be needed. Overdosage: If you think you have taken too much of this medicine contact a poison control center or emergency room at once. NOTE: This medicine is only for you. Do not share this medicine with others. What if I miss a dose? If you miss a dose, skip the missed dose and take your next tablet at the regular time. Do not take double or extra doses. What may interact with this medicine? Do not take this medicine with any of the following medications: -any prescription or street opioid drug like codeine, heroin, methadone -linezolid -MAOIs like Carbex, Eldepryl, Marplan, Nardil, and Parnate -methylene blue (injected into a vein) -other medicines that contain bupropion like Zyban or Wellbutrin This medicine may also interact with the following medications: -alcohol -certain medicines for anxiety or sleep -certain medicines for blood pressure like metoprolol, propranolol -certain medicines for depression or psychotic disturbances -certain medicines for HIV or AIDS like efavirenz, lopinavir, nelfinavir, ritonavir -certain medicines for irregular heart beat like propafenone, flecainide -certain medicines for Parkinson's disease like amantadine, levodopa -certain medicines for seizures like carbamazepine, phenytoin, phenobarbital -cimetidine -clopidogrel -cyclophosphamide -digoxin -disulfiram -furazolidone -isoniazid -nicotine -orphenadrine -procarbazine -steroid medicines like prednisone or cortisone -stimulant medicines for attention disorders, weight loss, or to stay awake -tamoxifen -theophylline -thioridazine -thiotepa -ticlopidine -tramadol -warfarin This list may not describe all possible interactions. Give your health care provider a list of all the medicines, herbs, non-prescription drugs, or dietary supplements you use. Also tell them if you smoke, drink alcohol, or use  illegal drugs. Some items may interact with your medicine. What should I watch for while using this   medicine? This medicine is intended to be used in addition to a healthy diet and appropriate exercise. The best results are achieved this way. Do not increase or in any way change your dose without consulting your doctor or health care professional. Do not take this medicine with other prescription or over-the-counter weight loss products without consulting your doctor or health care professional. Your doctor should tell you to stop taking this medicine if you do not lose a certain amount of weight within the first 12 weeks of treatment. Visit your doctor or health care professional for regular checkups. Your doctor may order blood tests or other tests to see how you are doing. This medicine may affect blood sugar levels. If you have diabetes, check with your doctor or health care professional before you change your diet or the dose of your diabetic medicine. Patients and their families should watch out for new or worsening depression or thoughts of suicide. Also watch out for sudden changes in feelings such as feeling anxious, agitated, panicky, irritable, hostile, aggressive, impulsive, severely restless, overly excited and hyperactive, or not being able to sleep. If this happens, especially at the beginning of treatment or after a change in dose, call your health care professional. Avoid alcoholic drinks while taking this medicine. Drinking large amounts of alcoholic beverages, using sleeping or anxiety medicines, or quickly stopping the use of these agents while taking this medicine may increase your risk for a seizure. What side effects may I notice from receiving this medicine? Side effects that you should report to your doctor or health care professional as soon as possible: -allergic reactions like skin rash, itching or hives, swelling of the face, lips, or tongue -breathing problems -changes in  vision -confusion -elevated mood, decreased need for sleep, racing thoughts, impulsive behavior -fast or irregular heartbeat -hallucinations, loss of contact with reality -increased blood pressure -redness, blistering, peeling or loosening of the skin, including inside the mouth -seizures -signs and symptoms of liver injury like dark yellow or brown urine; general ill feeling or flu-like symptoms; light-colored stools; loss of appetite; nausea; right upper belly pain; unusually weak or tired; yellowing of the eyes or skin -suicidal thoughts or other mood changes -vomiting Side effects that usually do not require medical attention (report to your doctor or health care professional if they continue or are bothersome): -constipation -headache -loss of appetite -indigestion, stomach upset -tremors This list may not describe all possible side effects. Call your doctor for medical advice about side effects. You may report side effects to FDA at 1-800-FDA-1088. Where should I keep my medicine? Keep out of the reach of children. Store at room temperature between 15 and 30 degrees C (59 and 86 degrees F). Throw away any unused medicine after the expiration date. NOTE: This sheet is a summary. It may not cover all possible information. If you have questions about this medicine, talk to your doctor, pharmacist, or health care provider.  2018 Elsevier/Gold Standard (2015-11-16 13:42:58)  

## 2017-01-15 NOTE — Progress Notes (Signed)
Patient: Alexis Lloyd Female    DOB: Jan 31, 1961   56 y.o.   MRN: [id number] Visit Date: 01/15/2017  Today's Provider: Mar Daring, PA-C   Chief Complaint  Patient presents with  . Fatigue   Subjective:    HPI Patient here today C/O fatigue x's 4 weeks. Patient reports symptoms of depression due to weight gain. Patient reports shortness of breath with activity. Patient reports that she wants to try phentermine.  Patient reports that she is taking 2 lisinopril-hctz daily x one week. Patient reports that she was having headaches and reports that headaches are better. She never checked her BP during this, just increased the lisinopril-hctz.   Patient reports left ankle pain, pt reports getting 3 injection in her right knee at ortho recently. Patient reports that she did report to ortho but they did not do anything for her ankle.     Allergies  Allergen Reactions  . Allegra [Fexofenadine] Nausea And Vomiting  . Avelox [Moxifloxacin] Other (See Comments)    Hallucinations  . Bactrim [Sulfamethoxazole-Trimethoprim] Other (See Comments)    unknown  . Etodolac Nausea Only  . Fish-Derived Products Nausea And Vomiting    With "seafood"  . Penicillins Diarrhea and Nausea And Vomiting  . Sulfa Antibiotics Other (See Comments)    unknown  . Macrobid [employer] Macro] Other (See Comments)    Constipation and globus hystericus.     Current Outpatient Prescriptions:  .  albuterol (PROVENTIL HFA) 108 (90 Base) MCG/ACT inhaler, Inhale 2 puffs into the lungs every 6 (six) hours as needed., Disp: 3 Inhaler, Rfl: 1 .  Blood Glucose Monitoring Suppl (ONE TOUCH ULTRA SYSTEM KIT) w/Device KIT, To check blood sugar daily, Disp: 1 each, Rfl: 0 .  cetirizine (ZYRTEC) 10 MG tablet, Take 1 tablet (10 mg total) by mouth daily., Disp: 90 tablet, Rfl: 1 .  Cholecalciferol (D-5000) 5000 UNITS TABS, Take 1 tablet by mouth daily. , Disp: , Rfl:  .  glucose blood (ONE TOUCH ULTRA  TEST) test strip, To check blood sugar daily, Disp: 100 each, Rfl: 12 .  Lancets (ONETOUCH ULTRASOFT) lancets, To check blood sugar daily, Disp: 100 each, Rfl: 12 .  lisinopril-hydrochlorothiazide (PRINZIDE,ZESTORETIC) 20-12.5 MG tablet, TAKE ONE TABLET BY MOUTH ONCE DAILY (Patient taking differently: Patient taking 2 TABLET BY MOUTH ONCE DAILY), Disp: 90 tablet, Rfl: 1 .  lovastatin (MEVACOR) 20 MG tablet, TAKE 2 TABLETS BY MOUTH ONCE DAILY, Disp: 180 tablet, Rfl: 1 .  meloxicam (MOBIC) 15 MG tablet, Take 1 tablet (15 mg total) by mouth daily., Disp: 90 tablet, Rfl: 1 .  metFORMIN (GLUCOPHAGE) 1000 MG tablet, Take 1 tablet (1,000 mg total) by mouth daily with breakfast., Disp: 90 tablet, Rfl: 1 .  mometasone-formoterol (DULERA) 100-5 MCG/ACT AERO, Inhale 2 puffs into the lungs daily., Disp: 39 g, Rfl: 1 .  montelukast (SINGULAIR) 10 MG tablet, Take 1 tablet (10 mg total) by mouth daily., Disp: 90 tablet, Rfl: 3 .  OMEGA-3 FATTY ACIDS PO, Take 1 tablet by mouth daily. , Disp: , Rfl:  .  omeprazole (PRILOSEC) 20 MG capsule, TAKE ONE CAPSULE BY MOUTH ONCE DAILY, Disp: 30 capsule, Rfl: 5 .  sitaGLIPtin (JANUVIA) 100 MG tablet, Take 1 tablet (100 mg total) by mouth daily., Disp: 90 tablet, Rfl: 1 .  spironolactone (ALDACTONE) 25 MG tablet, Take 1 tablet (25 mg total) by mouth daily., Disp: 90 tablet, Rfl: 1 .  tamoxifen (NOLVADEX) 10 MG tablet, Take 1 tablet (  10 mg total) by mouth daily., Disp: 90 tablet, Rfl: 1 .  verapamil (CALAN) 80 MG tablet, TAKE ONE TABLET BY MOUTH TWICE DAILY, Disp: 180 tablet, Rfl: 1 .  ibuprofen (ADVIL,MOTRIN) 600 MG tablet, Take 1 tablet (600 mg total) by mouth every 8 (eight) hours as needed. (Patient not taking: Reported on 01/15/2017), Disp: 30 tablet, Rfl: 0  Review of Systems  Constitutional: Positive for fatigue.  HENT: Negative.   Respiratory: Positive for shortness of breath (on exertion). Negative for cough, chest tightness and wheezing.   Cardiovascular: Negative.     Gastrointestinal: Negative.   Endocrine: Negative.   Musculoskeletal: Positive for arthralgias, gait problem (antalgic) and joint swelling (left ankle).  Neurological: Positive for headaches (last week but better with increased BP medications). Negative for dizziness, weakness, light-headedness and numbness.  Psychiatric/Behavioral: Positive for dysphoric mood. Negative for self-injury, sleep disturbance and suicidal ideas. The patient is not nervous/anxious.     Social History  Substance Use Topics  . Smoking status: Former Smoker    Packs/day: 0.25    Years: 21.00    Quit date: 06/08/1997  . Smokeless tobacco: Never Used  . Alcohol use No   Objective:   BP 120/70 (BP Location: Left Arm, Patient Position: Sitting, Cuff Size: Large)   Pulse 100   Temp 97.8 F (36.6 C) (Oral)   Resp 16   Wt 228 lb (103.4 kg)   BMI 46.05 kg/m  Vitals:   01/15/17 1423  BP: 120/70  Pulse: 100  Resp: 16  Temp: 97.8 F (36.6 C)  TempSrc: Oral  Weight: 228 lb (103.4 kg)     Physical Exam  Constitutional: She appears well-developed and well-nourished. No distress.  Obese  Neck: Normal range of motion. Neck supple. No thyromegaly present.  Cardiovascular: Normal rate, regular rhythm and normal heart sounds.  Exam reveals no gallop and no friction rub.   No murmur heard. Pulmonary/Chest: Effort normal and breath sounds normal. No respiratory distress. She has no wheezes. She has no rales.  Musculoskeletal:       Left ankle: She exhibits normal range of motion and no swelling. Tenderness (just posterior to lateral malleolus along peroneal tendons). Achilles tendon normal. Achilles tendon exhibits no pain, no defect and normal Thompson's test results.  Skin: She is not diaphoretic.  Vitals reviewed.      Assessment & Plan:     1. Essential hypertension BP at goal today in the office. Will continue increased dosing of lisinopril-hctz at twice daily. This was refilled as below.  -  lisinopril-hydrochlorothiazide (PRINZIDE,ZESTORETIC) 20-12.5 MG tablet; Patient taking 2 TABLET BY MOUTH ONCE DAILY  Dispense: 180 tablet; Refill: 1  2. Fatigue, unspecified type Suspect deconditioning and obesity as cause. Will check labs as below to make sure no other cause since it is a recent onset. I will f/u pending lab results.  - B12 - CBC with Differential  3. B12 deficiency H/O B12 def requiring injections. See above medical treatment plan. - B12 - CBC with Differential  4. Encounter for weight loss counseling Patient is obese with multiple comorbidities. She would benefit from weight loss. I do not feel she would be a great candidate for phentermine or belviq due to the extra cardiac strain they would place on her. I feel she would most benefit from [place] or Saxenda. She does not wish to have to do an injection daily so I will try to get Contrave covered for her. This has been prescribed as below.  -  Naltrexone-Bupropion HCl ER 8-90 MG TB12; Take 1 tab PO q am x 1 week, then 1 tab PO BID x 1 week, then 2 tabs PO q am and 1 tab PO q pm x 1 week, then 2 tabs PO BID  Dispense: 120 tablet; Refill: 3  5. BMI 45.0-49.9, [facility]) Counseled patient on healthy lifestyle modifications including dieting and exercise. See above medical treatment plan.  6. Psoriasis Stable. Diagnosis pulled for medication refill. Continue current medical treatment plan. - triamcinolone cream (KENALOG) 0.1 %; Apply 1 application topically 2 (two) times daily.  Dispense: 30 g; Refill: 0  7. Peroneal tendinitis of left lower extremity Suspect peroneal tendinitis. Patient is already established with a foot and ankle specialist at [facility] and already has appt scheduled for 01/28/17.        Mar Daring, PA-C  [facility]

## 2017-01-16 LAB — VITAMIN B12: Vitamin B-12: 468 pg/mL (ref 180–914)

## 2017-01-19 ENCOUNTER — Telehealth: Payer: Self-pay | Admitting: Physician Assistant

## 2017-01-19 ENCOUNTER — Telehealth: Payer: Self-pay

## 2017-01-19 NOTE — Telephone Encounter (Signed)
Patient advised as below.  

## 2017-01-19 NOTE — Telephone Encounter (Signed)
-----   Message from Mar Daring, PA-C sent at 01/16/2017  8:29 AM EDT ----- CBC and B12 normal.

## 2017-01-19 NOTE — Telephone Encounter (Signed)
I will go ahead and and start a new PA for the medication to see if it gets approve.

## 2017-01-19 NOTE — Telephone Encounter (Signed)
Pt called saying the medication that was prescribed for her for weight loss is not covered by insurance.  Pt states something else will have to be prescribed,  She has Production assistant, radio.  She uses [facility] on [address]  Pt's call back is [phone]  Thanks [employer]

## 2017-01-19 NOTE — Telephone Encounter (Signed)
lmtcb

## 2017-01-20 DIAGNOSIS — R69 Illness, unspecified: Secondary | ICD-10-CM | POA: Diagnosis not present

## 2017-01-21 ENCOUNTER — Telehealth: Payer: Self-pay | Admitting: Physician Assistant

## 2017-01-21 NOTE — Telephone Encounter (Signed)
Rec'd a voice mail message from [place] (I could not understand the name of the company).  She calling about a Rx for diabetic shoes.  XV#[phone]

## 2017-01-22 NOTE — Telephone Encounter (Signed)
Called and LM for [facility]) who left a message yesterday from [facility] [place].  Thanks,  -Dianna Deshler

## 2017-01-22 NOTE — Telephone Encounter (Signed)
PA was denied for Contrave. Patient was advised.   Thanks,  -Joseline

## 2017-01-23 NOTE — Telephone Encounter (Signed)
Please notify Adelei that she has to see one of the MDs in the office for a face to face and order signed by MD. This has changed from last year. She could see Dr. Rosanna Randy (my supervising) or Dr. Brita Romp (female).

## 2017-01-23 NOTE — Telephone Encounter (Signed)
Dawn from [facility] [place] CB# [phone]. Dawn reports that patient has to have a face to face and orders signed by MD.

## 2017-01-23 NOTE — Telephone Encounter (Signed)
Busy tone-aa

## 2017-01-26 NOTE — Telephone Encounter (Signed)
Busy signal

## 2017-01-28 DIAGNOSIS — M6700 Short Achilles tendon (acquired), unspecified ankle: Secondary | ICD-10-CM | POA: Insufficient documentation

## 2017-01-28 DIAGNOSIS — M2021 Hallux rigidus, right foot: Secondary | ICD-10-CM | POA: Diagnosis not present

## 2017-01-28 DIAGNOSIS — M2022 Hallux rigidus, left foot: Secondary | ICD-10-CM | POA: Diagnosis not present

## 2017-01-28 DIAGNOSIS — M76821 Posterior tibial tendinitis, right leg: Secondary | ICD-10-CM | POA: Insufficient documentation

## 2017-01-28 DIAGNOSIS — M76822 Posterior tibial tendinitis, left leg: Secondary | ICD-10-CM | POA: Insufficient documentation

## 2017-01-28 DIAGNOSIS — R920 Mammographic microcalcification found on diagnostic imaging of breast: Secondary | ICD-10-CM | POA: Diagnosis not present

## 2017-01-28 DIAGNOSIS — Z6841 Body Mass Index (BMI) 40.0 and over, adult: Secondary | ICD-10-CM | POA: Diagnosis not present

## 2017-01-28 DIAGNOSIS — M6701 Short Achilles tendon (acquired), right ankle: Secondary | ICD-10-CM | POA: Diagnosis not present

## 2017-01-28 DIAGNOSIS — M79672 Pain in left foot: Secondary | ICD-10-CM | POA: Diagnosis not present

## 2017-01-28 DIAGNOSIS — M2140 Flat foot [pes planus] (acquired), unspecified foot: Secondary | ICD-10-CM | POA: Insufficient documentation

## 2017-01-29 ENCOUNTER — Encounter: Payer: Self-pay | Admitting: Physician Assistant

## 2017-01-29 NOTE — Telephone Encounter (Signed)
Patient advised. Appointment scheduled for 8/29 with Dr. Brita Romp.

## 2017-01-29 NOTE — Telephone Encounter (Signed)
lmtcb [phone]

## 2017-02-04 ENCOUNTER — Encounter: Payer: Self-pay | Admitting: Family Medicine

## 2017-02-04 ENCOUNTER — Ambulatory Visit (INDEPENDENT_AMBULATORY_CARE_PROVIDER_SITE_OTHER): Payer: Medicare HMO | Admitting: Family Medicine

## 2017-02-04 VITALS — BP 112/68 | HR 72 | Temp 97.7°F | Resp 16 | Wt 231.0 lb

## 2017-02-04 DIAGNOSIS — Z6841 Body Mass Index (BMI) 40.0 and over, adult: Secondary | ICD-10-CM | POA: Diagnosis not present

## 2017-02-04 DIAGNOSIS — E1143 Type 2 diabetes mellitus with diabetic autonomic (poly)neuropathy: Secondary | ICD-10-CM

## 2017-02-04 DIAGNOSIS — E669 Obesity, unspecified: Secondary | ICD-10-CM | POA: Diagnosis not present

## 2017-02-04 DIAGNOSIS — E1142 Type 2 diabetes mellitus with diabetic polyneuropathy: Secondary | ICD-10-CM

## 2017-02-04 DIAGNOSIS — IMO0001 Reserved for inherently not codable concepts without codable children: Secondary | ICD-10-CM

## 2017-02-04 MED ORDER — DULAGLUTIDE 0.75 MG/0.5ML ~~LOC~~ SOAJ: 0.7500 mg | SUBCUTANEOUS | 1 refills | Status: DC

## 2017-02-04 NOTE — Patient Instructions (Signed)

## 2017-02-04 NOTE — Assessment & Plan Note (Signed)
Likely that Trulicity for diabetes will also help with weight loss F/u in 1 month Advised on diet and exercise

## 2017-02-04 NOTE — Progress Notes (Signed)
Patient: Alexis Lloyd Female    DOB: Oct 10, 1960   56 y.o.   MRN: [id number] Visit Date: 02/04/2017  Today's Provider: Lavon Paganini, MD   Chief Complaint  Patient presents with  . Diabetic Neuropathy   Subjective:    HPI   Diabetic Neuropathy Alexis Lloyd presents to office for a face to face meeting for an order of her diabetic shoes. She is experiencing numbness and tingling in her feet. She is taking her Metformin 1000 mg once daily and Januvia 100 mg once daily as prescribed. She is tolerating these medications well. Her FBS are ranging from 120's-130's.  Patient states that she'd like to see someone to file down her calluses on her feet (a podiatrist) Lab Results  Component Value Date   HGBA1C 7.9 12/01/2016    Obesity Wt Readings from Last 3 Encounters:  02/04/17 231 lb (104.8 kg)  01/15/17 228 lb (103.4 kg)  12/01/16 228 lb 12.8 oz (103.8 kg)   Tried "boiled egg diet" - eating 2 hard boiled eggs and fruit for breakfast, grilled/baked chicken and salad for lunch and dinner +/- fruit.  Drinking 8 glasses of water/day. Disappointed that she did not lose 24 lbs in 2 wks, which was advertised with the diet Wants to join an all women gym and start working out soon. Was unable to afford Contrave as it was not covered by insurance.   Allergies  Allergen Reactions  . Allegra [Fexofenadine] Nausea And Vomiting  . Avelox [Moxifloxacin] Other (See Comments)    Hallucinations  . Bactrim [Sulfamethoxazole-Trimethoprim] Other (See Comments)    unknown  . Etodolac Nausea Only  . Fish-Derived Products Nausea And Vomiting    With "seafood"  . Penicillins Diarrhea and Nausea And Vomiting  . Sulfa Antibiotics Other (See Comments)    unknown  . Macrobid [employer] Macro] Other (See Comments)    Constipation and globus hystericus.     Current Outpatient Prescriptions:  .  albuterol (PROVENTIL HFA) 108 (90 Base) MCG/ACT inhaler, Inhale 2 puffs into the lungs  every 6 (six) hours as needed., Disp: 3 Inhaler, Rfl: 1 .  Blood Glucose Monitoring Suppl (ONE TOUCH ULTRA SYSTEM KIT) w/Device KIT, To check blood sugar daily, Disp: 1 each, Rfl: 0 .  cetirizine (ZYRTEC) 10 MG tablet, Take 1 tablet (10 mg total) by mouth daily., Disp: 90 tablet, Rfl: 1 .  Cholecalciferol (D-5000) 5000 UNITS TABS, Take 1 tablet by mouth daily. , Disp: , Rfl:  .  glucose blood (ONE TOUCH ULTRA TEST) test strip, To check blood sugar daily, Disp: 100 each, Rfl: 12 .  Lancets (ONETOUCH ULTRASOFT) lancets, To check blood sugar daily, Disp: 100 each, Rfl: 12 .  lisinopril-hydrochlorothiazide (PRINZIDE,ZESTORETIC) 20-12.5 MG tablet, Patient taking 2 TABLET BY MOUTH ONCE DAILY, Disp: 180 tablet, Rfl: 1 .  lovastatin (MEVACOR) 20 MG tablet, TAKE 2 TABLETS BY MOUTH ONCE DAILY, Disp: 180 tablet, Rfl: 1 .  meloxicam (MOBIC) 15 MG tablet, Take 1 tablet (15 mg total) by mouth daily., Disp: 90 tablet, Rfl: 1 .  metFORMIN (GLUCOPHAGE) 1000 MG tablet, Take 1 tablet (1,000 mg total) by mouth daily with breakfast., Disp: 90 tablet, Rfl: 1 .  montelukast (SINGULAIR) 10 MG tablet, Take 1 tablet (10 mg total) by mouth daily., Disp: 90 tablet, Rfl: 3 .  OMEGA-3 FATTY ACIDS PO, Take 1 tablet by mouth daily. , Disp: , Rfl:  .  omeprazole (PRILOSEC) 20 MG capsule, TAKE ONE CAPSULE BY MOUTH ONCE DAILY, Disp: 30  capsule, Rfl: 5 .  sitaGLIPtin (JANUVIA) 100 MG tablet, Take 1 tablet (100 mg total) by mouth daily., Disp: 90 tablet, Rfl: 1 .  spironolactone (ALDACTONE) 25 MG tablet, Take 1 tablet (25 mg total) by mouth daily., Disp: 90 tablet, Rfl: 1 .  tamoxifen (NOLVADEX) 10 MG tablet, Take 1 tablet (10 mg total) by mouth daily., Disp: 90 tablet, Rfl: 1 .  triamcinolone cream (KENALOG) 0.1 %, Apply 1 application topically 2 (two) times daily., Disp: 30 g, Rfl: 0 .  verapamil (CALAN) 80 MG tablet, TAKE ONE TABLET BY MOUTH TWICE DAILY, Disp: 180 tablet, Rfl: 1 .  mometasone-formoterol (DULERA) 100-5 MCG/ACT AERO,  Inhale 2 puffs into the lungs daily. (Patient not taking: Reported on 02/04/2017), Disp: 39 g, Rfl: 1 .  Naltrexone-Bupropion HCl ER 8-90 MG TB12, Take 1 tab PO q am x 1 week, then 1 tab PO BID x 1 week, then 2 tabs PO q am and 1 tab PO q pm x 1 week, then 2 tabs PO BID (Patient not taking: Reported on 02/04/2017), Disp: 120 tablet, Rfl: 3  Review of Systems  Constitutional: Positive for diaphoresis, fatigue and unexpected weight change. Negative for activity change, appetite change, chills and fever.  Eyes: Negative for visual disturbance.  Cardiovascular: Negative for chest pain, palpitations and leg swelling.  Endocrine: Positive for polydipsia. Negative for polyphagia and polyuria.  Neurological: Positive for numbness (and tingling in feet).    Social History  Substance Use Topics  . Smoking status: Former Smoker    Packs/day: 0.25    Years: 21.00    Quit date: 06/08/1997  . Smokeless tobacco: Never Used  . Alcohol use No   Objective:   BP 112/68 (BP Location: Right Arm, Patient Position: Sitting, Cuff Size: Large)   Pulse 72   Temp 97.7 F (36.5 C) (Oral)   Resp 16   Wt 231 lb (104.8 kg)   BMI 46.66 kg/m  Vitals:   02/04/17 1104  BP: 112/68  Pulse: 72  Resp: 16  Temp: 97.7 F (36.5 C)  TempSrc: Oral  Weight: 231 lb (104.8 kg)     Physical Exam  Constitutional: She is oriented to person, place, and time. She appears well-developed and well-nourished. No distress.  HENT:  Head: Normocephalic and atraumatic.  Right Ear: External ear normal.  Left Ear: External ear normal.  Mouth/Throat: Oropharynx is clear and moist.  Eyes: Conjunctivae are normal. No scleral icterus.  Neck: Neck supple.  Cardiovascular: Normal rate, regular rhythm, normal heart sounds and intact distal pulses.   No murmur heard. Pulmonary/Chest: Effort normal and breath sounds normal. No respiratory distress. She has no wheezes. She has no rales.  Musculoskeletal: She exhibits no edema.    Neurological: She is alert and oriented to person, place, and time.  Skin: Skin is warm and dry. No rash noted.  Psychiatric: She has a normal mood and affect. Her behavior is normal.  Vitals reviewed.   Diabetic Foot Check -  Appearance - no lesions, ulcers. +calluses on b/l great toes, and medial foot Skin - no unusual pallor or redness Monofilament testing -  Right - Great toe, medial, central, lateral ball and posterior foot decreased Left - Great toe, medial, central, lateral ball and posterior foot decreased     Assessment & Plan:         Diabetes ([place]) Uncontrolled Last A1c 7.9 Continue januvia nad Metformin Add GLP1 - will try Trulicity Foot exam completed today Rx for diabetic shoes completed  F/u in 1 month with PCP Consider titrating dose of Trulicity if tolerating well  Diabetic neuropathy Rx for diabetic shoes as above Advised on nightly foot checks Referral to podiatry placed  Adiposity Likely that Trulicity for diabetes will also help with weight loss F/u in 1 month Advised on diet and exercise   The entirety of the information documented in the History of Present Illness, Review of Systems and Physical Exam were personally obtained by me. Portions of this information were initially documented by Raquel Sarna Ratchford, CMA and reviewed by me for thoroughness and accuracy.    Lavon Paganini, MD  [facility]

## 2017-02-04 NOTE — Assessment & Plan Note (Signed)
Uncontrolled Last A1c 7.9 Continue januvia nad Metformin Add GLP1 - will try Trulicity Foot exam completed today Rx for diabetic shoes completed F/u in 1 month with PCP Consider titrating dose of Trulicity if tolerating well

## 2017-02-04 NOTE — Assessment & Plan Note (Signed)
Rx for diabetic shoes as above Advised on nightly foot checks Referral to podiatry placed

## 2017-02-24 ENCOUNTER — Ambulatory Visit: Payer: Self-pay | Admitting: Podiatry

## 2017-02-24 ENCOUNTER — Encounter: Payer: Self-pay | Admitting: Family Medicine

## 2017-02-24 ENCOUNTER — Ambulatory Visit (INDEPENDENT_AMBULATORY_CARE_PROVIDER_SITE_OTHER): Payer: Medicare HMO | Admitting: Family Medicine

## 2017-02-24 VITALS — BP 124/68 | HR 92 | Temp 98.2°F | Resp 16 | Wt 226.0 lb

## 2017-02-24 DIAGNOSIS — R110 Nausea: Secondary | ICD-10-CM | POA: Diagnosis not present

## 2017-02-24 DIAGNOSIS — H00014 Hordeolum externum left upper eyelid: Secondary | ICD-10-CM | POA: Diagnosis not present

## 2017-02-24 NOTE — Progress Notes (Signed)
Patient: Alexis Lloyd Female    DOB: 1960/09/08   56 y.o.   MRN: [id number] Visit Date: 02/24/2017  Today's Provider: Lavon Paganini, MD   Chief Complaint  Patient presents with  . Eye Problem   Subjective:    Eye Problem   The left eye is affected. This is a new problem. Episode onset: x 2 days. The problem has been gradually worsening. There was no injury mechanism. The pain is at a severity of 7/10. The pain is severe. There is no known exposure to pink eye. She does not wear contacts. Associated symptoms include an eye discharge, eye redness, a foreign body sensation, itching, nausea, photophobia and vomiting. Pertinent negatives include no blurred vision, double vision, fever or recent URI. Associated symptoms comments: Swelling of left eye. Treatments tried: warm compresses and eye drops. The treatment provided no relief.   No drainage.  No vision changes.     Reports nausea and vomiting x1 this AM - no abd pain - just started Trulicity for Y8XK and weight loss - would like to stay on this medication as she has lost 5 lbs - has PCP f/u next week for T2DM    Allergies  Allergen Reactions  . Allegra [Fexofenadine] Nausea And Vomiting  . Avelox [Moxifloxacin] Other (See Comments)    Hallucinations  . Bactrim [Sulfamethoxazole-Trimethoprim] Other (See Comments)    unknown  . Etodolac Nausea Only  . Fish-Derived Products Nausea And Vomiting    With "seafood"  . Penicillins Diarrhea and Nausea And Vomiting  . Sulfa Antibiotics Other (See Comments)    unknown  . Macrobid [employer] Macro] Other (See Comments)    Constipation and globus hystericus.     Current Outpatient Prescriptions:  .  albuterol (PROVENTIL HFA) 108 (90 Base) MCG/ACT inhaler, Inhale 2 puffs into the lungs every 6 (six) hours as needed., Disp: 3 Inhaler, Rfl: 1 .  Blood Glucose Monitoring Suppl (ONE TOUCH ULTRA SYSTEM KIT) w/Device KIT, To check blood sugar daily, Disp: 1 each, Rfl:  0 .  cetirizine (ZYRTEC) 10 MG tablet, Take 1 tablet (10 mg total) by mouth daily., Disp: 90 tablet, Rfl: 1 .  Cholecalciferol (D-5000) 5000 UNITS TABS, Take 1 tablet by mouth daily. , Disp: , Rfl:  .  Dulaglutide (TRULICITY) 4.81 EH/6.3JS SOPN, Inject 0.75 mg into the skin once a week., Disp: 4 pen, Rfl: 1 .  glucose blood (ONE TOUCH ULTRA TEST) test strip, To check blood sugar daily, Disp: 100 each, Rfl: 12 .  Lancets (ONETOUCH ULTRASOFT) lancets, To check blood sugar daily, Disp: 100 each, Rfl: 12 .  lisinopril-hydrochlorothiazide (PRINZIDE,ZESTORETIC) 20-12.5 MG tablet, Patient taking 2 TABLET BY MOUTH ONCE DAILY, Disp: 180 tablet, Rfl: 1 .  lovastatin (MEVACOR) 20 MG tablet, TAKE 2 TABLETS BY MOUTH ONCE DAILY, Disp: 180 tablet, Rfl: 1 .  meloxicam (MOBIC) 15 MG tablet, Take 1 tablet (15 mg total) by mouth daily., Disp: 90 tablet, Rfl: 1 .  metFORMIN (GLUCOPHAGE) 1000 MG tablet, Take 1 tablet (1,000 mg total) by mouth daily with breakfast., Disp: 90 tablet, Rfl: 1 .  mometasone-formoterol (DULERA) 100-5 MCG/ACT AERO, Inhale 2 puffs into the lungs daily., Disp: 39 g, Rfl: 1 .  montelukast (SINGULAIR) 10 MG tablet, Take 1 tablet (10 mg total) by mouth daily., Disp: 90 tablet, Rfl: 3 .  OMEGA-3 FATTY ACIDS PO, Take 1 tablet by mouth daily. , Disp: , Rfl:  .  omeprazole (PRILOSEC) 20 MG capsule, TAKE ONE CAPSULE BY MOUTH  ONCE DAILY, Disp: 30 capsule, Rfl: 5 .  sitaGLIPtin (JANUVIA) 100 MG tablet, Take 1 tablet (100 mg total) by mouth daily., Disp: 90 tablet, Rfl: 1 .  spironolactone (ALDACTONE) 25 MG tablet, Take 1 tablet (25 mg total) by mouth daily., Disp: 90 tablet, Rfl: 1 .  tamoxifen (NOLVADEX) 10 MG tablet, Take 1 tablet (10 mg total) by mouth daily., Disp: 90 tablet, Rfl: 1 .  triamcinolone cream (KENALOG) 0.1 %, Apply 1 application topically 2 (two) times daily., Disp: 30 g, Rfl: 0 .  verapamil (CALAN) 80 MG tablet, TAKE ONE TABLET BY MOUTH TWICE DAILY, Disp: 180 tablet, Rfl: 1  Review of  Systems  Constitutional: Negative for fever.  Eyes: Positive for photophobia, discharge, redness and itching. Negative for blurred vision and double vision.  Gastrointestinal: Positive for nausea and vomiting.    Social History  Substance Use Topics  . Smoking status: Former Smoker    Packs/day: 0.25    Years: 21.00    Quit date: 06/08/1997  . Smokeless tobacco: Never Used  . Alcohol use No   Objective:   BP 124/68 (BP Location: Right Arm, Patient Position: Sitting, Cuff Size: Large)   Pulse 92   Temp 98.2 F (36.8 C) (Oral)   Resp 16   Wt 226 lb (102.5 kg)   BMI 45.65 kg/m  Vitals:   02/24/17 1002  BP: 124/68  Pulse: 92  Resp: 16  Temp: 98.2 F (36.8 C)  TempSrc: Oral  Weight: 226 lb (102.5 kg)     Physical Exam  Constitutional: She is oriented to person, place, and time. She appears well-developed and well-nourished. No distress.  HENT:  Head: Normocephalic and atraumatic.  Right Ear: External ear normal.  Left Ear: External ear normal.  Nose: Nose normal.  Mouth/Throat: Oropharynx is clear and moist.  Eyes: Pupils are equal, round, and reactive to light. Conjunctivae and EOM are normal. Right eye exhibits no hordeolum. Left eye exhibits hordeolum. Left eye exhibits no discharge and no exudate. Right conjunctiva is not injected. Left conjunctiva is not injected. No scleral icterus.  Cardiovascular: Normal rate and regular rhythm.   Pulmonary/Chest: Effort normal. No respiratory distress.  Neurological: She is alert and oriented to person, place, and time.  Psychiatric: She has a normal mood and affect. Her behavior is normal.  Vitals reviewed.      Assessment & Plan:     1. Hordeolum externum of left upper eyelid Exam consistent with stye, no signs of infection - advised warm compresses - return precautions discussed  2. Nausea - likely related to GLP1 that she recently started - ok to continue for now and f/u as scheduled with PCP      The entirety  of the information documented in the History of Present Illness, Review of Systems and Physical Exam were personally obtained by me. Portions of this information were initially documented by Raquel Sarna Ratchford, CMA and reviewed by me for thoroughness and accuracy.     Lavon Paganini, MD  [facility]

## 2017-02-24 NOTE — Patient Instructions (Signed)

## 2017-03-03 ENCOUNTER — Ambulatory Visit: Payer: Medicare HMO | Admitting: Physician Assistant

## 2017-03-04 ENCOUNTER — Ambulatory Visit (INDEPENDENT_AMBULATORY_CARE_PROVIDER_SITE_OTHER): Payer: Medicare HMO | Admitting: Physician Assistant

## 2017-03-04 ENCOUNTER — Encounter: Payer: Self-pay | Admitting: Physician Assistant

## 2017-03-04 ENCOUNTER — Ambulatory Visit: Payer: Medicare HMO | Admitting: Physician Assistant

## 2017-03-04 VITALS — BP 120/70 | HR 90 | Temp 98.4°F | Resp 16 | Wt 226.8 lb

## 2017-03-04 DIAGNOSIS — E1142 Type 2 diabetes mellitus with diabetic polyneuropathy: Secondary | ICD-10-CM

## 2017-03-04 LAB — POCT GLYCOSYLATED HEMOGLOBIN (HGB A1C)
Est. average glucose Bld gHb Est-mCnc: 189
Hemoglobin A1C: 8.2

## 2017-03-04 NOTE — Progress Notes (Signed)
Patient: Alexis Lloyd Female    DOB: 08/08/1960   56 y.o.   MRN: [id number] Visit Date: 03/04/2017  Today's Provider: Mar Daring, PA-C   Chief Complaint  Patient presents with  . Follow-up    Diabetes   Subjective:    HPI    Diabetes Mellitus Type II, Follow-up:   Lab Results  Component Value Date   HGBA1C 8.2 03/04/2017   HGBA1C 7.9 12/01/2016   HGBA1C 8.2 (H) 08/28/2016    Last seen for diabetes 1 months ago.  Management since then includes rx for diabetic shoes and Trulicity was added. She reports excellent compliance with treatment. She is having side effects. Current symptoms include nausea and have been stable. Home blood sugar records: ranging from 120's-130's  Episodes of hypoglycemia? no   Current Insulin Regimen: Trulicity  Most Recent Eye Exam: per patient 4 months ago 10/2016 Weight trend: stable Current diet: Well Balanced Current exercise: house cleaning and she is currently going to some classes at the [place]:    Component Value Date/Time   CHOL 155 08/28/2016 1050   CHOL 158 07/18/2015 0855   TRIG 103 08/28/2016 1050   HDL 30 (L) 08/28/2016 1050   HDL 27 (L) 07/18/2015 0855   LDLCALC 104 (H) 08/28/2016 1050   LDLCALC 95 07/18/2015 0855   CREATININE 0.66 08/28/2016 1050   CREATININE 0.75 04/14/2014 0925    Wt Readings from Last 3 Encounters:  03/04/17 226 lb 12.8 oz (102.9 kg)  02/24/17 226 lb (102.5 kg)  02/04/17 231 lb (104.8 kg)   ------------------------------------------------------------------------  Patient declined flu vaccine.    Allergies  Allergen Reactions  . Allegra [Fexofenadine] Nausea And Vomiting  . Avelox [Moxifloxacin] Other (See Comments)    Hallucinations  . Bactrim [Sulfamethoxazole-Trimethoprim] Other (See Comments)    unknown  . Etodolac Nausea Only  . Fish-Derived Products Nausea And Vomiting    With "seafood"  . Penicillins Diarrhea and Nausea And Vomiting  .  Sulfa Antibiotics Other (See Comments)    unknown  . Macrobid [employer] Macro] Other (See Comments)    Constipation and globus hystericus.     Current Outpatient Prescriptions:  .  albuterol (PROVENTIL HFA) 108 (90 Base) MCG/ACT inhaler, Inhale 2 puffs into the lungs every 6 (six) hours as needed., Disp: 3 Inhaler, Rfl: 1 .  Blood Glucose Monitoring Suppl (ONE TOUCH ULTRA SYSTEM KIT) w/Device KIT, To check blood sugar daily, Disp: 1 each, Rfl: 0 .  cetirizine (ZYRTEC) 10 MG tablet, Take 1 tablet (10 mg total) by mouth daily., Disp: 90 tablet, Rfl: 1 .  Cholecalciferol (D-5000) 5000 UNITS TABS, Take 1 tablet by mouth daily. , Disp: , Rfl:  .  Dulaglutide (TRULICITY) 5.88 FO/2.7XA SOPN, Inject 0.75 mg into the skin once a week., Disp: 4 pen, Rfl: 1 .  glucose blood (ONE TOUCH ULTRA TEST) test strip, To check blood sugar daily, Disp: 100 each, Rfl: 12 .  Lancets (ONETOUCH ULTRASOFT) lancets, To check blood sugar daily, Disp: 100 each, Rfl: 12 .  lisinopril-hydrochlorothiazide (PRINZIDE,ZESTORETIC) 20-12.5 MG tablet, Patient taking 2 TABLET BY MOUTH ONCE DAILY, Disp: 180 tablet, Rfl: 1 .  lovastatin (MEVACOR) 20 MG tablet, TAKE 2 TABLETS BY MOUTH ONCE DAILY, Disp: 180 tablet, Rfl: 1 .  meloxicam (MOBIC) 15 MG tablet, Take 1 tablet (15 mg total) by mouth daily., Disp: 90 tablet, Rfl: 1 .  metFORMIN (GLUCOPHAGE) 1000 MG tablet, Take 1 tablet (1,000 mg total)  by mouth daily with breakfast., Disp: 90 tablet, Rfl: 1 .  mometasone-formoterol (DULERA) 100-5 MCG/ACT AERO, Inhale 2 puffs into the lungs daily., Disp: 39 g, Rfl: 1 .  montelukast (SINGULAIR) 10 MG tablet, Take 1 tablet (10 mg total) by mouth daily., Disp: 90 tablet, Rfl: 3 .  OMEGA-3 FATTY ACIDS PO, Take 1 tablet by mouth daily. , Disp: , Rfl:  .  omeprazole (PRILOSEC) 20 MG capsule, TAKE ONE CAPSULE BY MOUTH ONCE DAILY, Disp: 30 capsule, Rfl: 5 .  sitaGLIPtin (JANUVIA) 100 MG tablet, Take 1 tablet (100 mg total) by mouth  daily., Disp: 90 tablet, Rfl: 1 .  spironolactone (ALDACTONE) 25 MG tablet, Take 1 tablet (25 mg total) by mouth daily., Disp: 90 tablet, Rfl: 1 .  tamoxifen (NOLVADEX) 10 MG tablet, Take 1 tablet (10 mg total) by mouth daily., Disp: 90 tablet, Rfl: 1 .  triamcinolone cream (KENALOG) 0.1 %, Apply 1 application topically 2 (two) times daily., Disp: 30 g, Rfl: 0 .  verapamil (CALAN) 80 MG tablet, TAKE ONE TABLET BY MOUTH TWICE DAILY, Disp: 180 tablet, Rfl: 1  Review of Systems  Constitutional: Negative.   Respiratory: Negative.   Cardiovascular: Negative.   Gastrointestinal: Positive for nausea. Negative for abdominal pain, constipation, diarrhea and vomiting.  Musculoskeletal: Negative.   Neurological: Negative.     Social History  Substance Use Topics  . Smoking status: Former Smoker    Packs/day: 0.25    Years: 21.00    Quit date: 06/08/1997  . Smokeless tobacco: Never Used  . Alcohol use No   Objective:   BP 120/70 (BP Location: Right Arm, Patient Position: Sitting, Cuff Size: Large)   Pulse 90   Temp 98.4 F (36.9 C) (Oral)   Resp 16   Wt 226 lb 12.8 oz (102.9 kg)   BMI 45.81 kg/m    Physical Exam  Constitutional: She appears well-developed and well-nourished. No distress.  Neck: Normal range of motion. Neck supple. No JVD present. No tracheal deviation present. No thyromegaly present.  Cardiovascular: Normal rate, regular rhythm and normal heart sounds.  Exam reveals no gallop and no friction rub.   No murmur heard. Pulmonary/Chest: Effort normal and breath sounds normal. No respiratory distress. She has no wheezes. She has no rales.  Musculoskeletal: She exhibits no edema.  Lymphadenopathy:    She has no cervical adenopathy.  Skin: She is not diaphoretic.  Vitals reviewed.      Assessment & Plan:     1. Type 2 diabetes mellitus with diabetic polyneuropathy, without long-term current use of insulin (HCC) A1c up to 8.2. Patient reports compliance with medical  treatment but states she has been eating more fruit (adding into her salad) and drinking fruit juice. She reports drinking fruit juice to keep her from drinking soda. She does still also drink one pepsi daily for her nausea. Discussed patient cutting out juice and trying to limit soda, not drinking the whole soda, but trying to only drink half. Continue trulicity weekly, metformin 1054m once daily and januvia 1055monce daily. I will see her back in 2 months to recheck A1c.  - POCT glycosylated hemoglobin (Hb A1C)       JeMar DaringPA-C  [facility]

## 2017-03-04 NOTE — Patient Instructions (Signed)

## 2017-03-06 ENCOUNTER — Ambulatory Visit: Payer: Self-pay | Admitting: Physician Assistant

## 2017-03-16 ENCOUNTER — Other Ambulatory Visit: Payer: Self-pay | Admitting: Physician Assistant

## 2017-03-16 DIAGNOSIS — L409 Psoriasis, unspecified: Secondary | ICD-10-CM

## 2017-03-19 DIAGNOSIS — R928 Other abnormal and inconclusive findings on diagnostic imaging of breast: Secondary | ICD-10-CM | POA: Diagnosis not present

## 2017-03-31 ENCOUNTER — Encounter: Payer: Self-pay | Admitting: Podiatry

## 2017-04-06 ENCOUNTER — Other Ambulatory Visit: Payer: Self-pay | Admitting: Family Medicine

## 2017-04-06 ENCOUNTER — Telehealth: Payer: Self-pay | Admitting: Physician Assistant

## 2017-04-06 DIAGNOSIS — E1165 Type 2 diabetes mellitus with hyperglycemia: Secondary | ICD-10-CM

## 2017-04-06 DIAGNOSIS — E11610 Type 2 diabetes mellitus with diabetic neuropathic arthropathy: Secondary | ICD-10-CM

## 2017-04-06 DIAGNOSIS — E1100 Type 2 diabetes mellitus with hyperosmolarity without nonketotic hyperglycemic-hyperosmolar coma (NKHHC): Secondary | ICD-10-CM

## 2017-04-06 DIAGNOSIS — L409 Psoriasis, unspecified: Secondary | ICD-10-CM

## 2017-04-06 MED ORDER — TRIAMCINOLONE ACETONIDE 0.1 % EX CREA: TOPICAL_CREAM | CUTANEOUS | 0 refills | Status: DC

## 2017-04-06 MED ORDER — DULAGLUTIDE 0.75 MG/0.5ML ~~LOC~~ SOAJ
0.7500 mg | SUBCUTANEOUS | 5 refills | Status: DC
Start: 2017-04-06 — End: 2017-04-07

## 2017-04-06 NOTE — Telephone Encounter (Signed)
Refills sent

## 2017-04-06 NOTE — Telephone Encounter (Signed)
Pt contacted office for refill request on the following medications:  Dulaglutide (TRULICITY) 9.01 QQ/2.4VH SOPN   triamcinolone cream (KENALOG) 0.1 %    [facility] [address]  [phone]

## 2017-04-13 ENCOUNTER — Encounter: Payer: Self-pay | Admitting: Physician Assistant

## 2017-04-13 ENCOUNTER — Ambulatory Visit: Payer: Medicare HMO | Admitting: Physician Assistant

## 2017-04-13 VITALS — BP 138/70 | HR 97 | Temp 98.2°F | Resp 16 | Wt 228.0 lb

## 2017-04-13 DIAGNOSIS — J4520 Mild intermittent asthma, uncomplicated: Secondary | ICD-10-CM

## 2017-04-13 DIAGNOSIS — J0140 Acute pansinusitis, unspecified: Secondary | ICD-10-CM | POA: Diagnosis not present

## 2017-04-13 DIAGNOSIS — H66001 Acute suppurative otitis media without spontaneous rupture of ear drum, right ear: Secondary | ICD-10-CM

## 2017-04-13 DIAGNOSIS — E1165 Type 2 diabetes mellitus with hyperglycemia: Secondary | ICD-10-CM

## 2017-04-13 DIAGNOSIS — IMO0002 Reserved for concepts with insufficient information to code with codable children: Secondary | ICD-10-CM

## 2017-04-13 DIAGNOSIS — E1140 Type 2 diabetes mellitus with diabetic neuropathy, unspecified: Secondary | ICD-10-CM | POA: Diagnosis not present

## 2017-04-13 MED ORDER — DULAGLUTIDE 1.5 MG/0.5ML ~~LOC~~ SOAJ: 1.5000 mg | SUBCUTANEOUS | 3 refills | Status: DC

## 2017-04-13 MED ORDER — DOXYCYCLINE HYCLATE 100 MG PO TABS: 100.0000 mg | ORAL_TABLET | Freq: Two times a day (BID) | ORAL | 0 refills | Status: DC

## 2017-04-13 MED ORDER — ALBUTEROL SULFATE HFA 108 (90 BASE) MCG/ACT IN AERS: 2.0000 | INHALATION_SPRAY | Freq: Four times a day (QID) | RESPIRATORY_TRACT | 1 refills | Status: DC | PRN

## 2017-04-13 NOTE — Progress Notes (Signed)
Patient: Alexis Lloyd Female    DOB: Oct 13, 1960   56 y.o.   MRN: [id number] Visit Date: 04/13/2017  Today's Provider: Mar Daring, PA-C   Chief Complaint  Patient presents with  . URI   Subjective:    URI   This is a new problem. The current episode started in the past 7 days (3 days ago). The problem has been gradually worsening. There has been no fever. Associated symptoms include congestion, coughing, ear pain (Right ear pain), nausea, neck pain (Right side), a plugged ear sensation, sinus pain, sneezing and a sore throat. Pertinent negatives include no abdominal pain, chest pain, diarrhea, headaches, vomiting or wheezing. She has tried nothing for the symptoms.      Allergies  Allergen Reactions  . Allegra [Fexofenadine] Nausea And Vomiting  . Avelox [Moxifloxacin] Other (See Comments)    Hallucinations  . Bactrim [Sulfamethoxazole-Trimethoprim] Other (See Comments)    unknown  . Etodolac Nausea Only  . Fish-Derived Products Nausea And Vomiting    With "seafood"  . Penicillins Diarrhea and Nausea And Vomiting  . Sulfa Antibiotics Other (See Comments)    unknown  . Macrobid [employer] Macro] Other (See Comments)    Constipation and globus hystericus.     Current Outpatient Medications:  .  amLODipine (NORVASC) 5 MG tablet, Take by mouth., Disp: , Rfl:  .  Blood Glucose Monitoring Suppl (ONE TOUCH ULTRA SYSTEM KIT) w/Device KIT, To check blood sugar daily, Disp: 1 each, Rfl: 0 .  cetirizine (ZYRTEC) 10 MG tablet, Take 1 tablet (10 mg total) by mouth daily., Disp: 90 tablet, Rfl: 1 .  Cholecalciferol (D-5000) 5000 UNITS TABS, Take 1 tablet by mouth daily. , Disp: , Rfl:  .  glucose blood (ONE TOUCH ULTRA TEST) test strip, To check blood sugar daily, Disp: 100 each, Rfl: 12 .  Lancets (ONETOUCH ULTRASOFT) lancets, To check blood sugar daily, Disp: 100 each, Rfl: 12 .  lisinopril-hydrochlorothiazide (PRINZIDE,ZESTORETIC) 20-12.5 MG tablet,  Patient taking 2 TABLET BY MOUTH ONCE DAILY, Disp: 180 tablet, Rfl: 1 .  lovastatin (MEVACOR) 20 MG tablet, TAKE 2 TABLETS BY MOUTH ONCE DAILY, Disp: 180 tablet, Rfl: 1 .  meloxicam (MOBIC) 15 MG tablet, Take 1 tablet (15 mg total) by mouth daily., Disp: 90 tablet, Rfl: 1 .  metFORMIN (GLUCOPHAGE) 1000 MG tablet, Take 1 tablet (1,000 mg total) by mouth daily with breakfast., Disp: 90 tablet, Rfl: 1 .  mometasone-formoterol (DULERA) 100-5 MCG/ACT AERO, Inhale 2 puffs into the lungs daily., Disp: 39 g, Rfl: 1 .  montelukast (SINGULAIR) 10 MG tablet, Take 1 tablet (10 mg total) by mouth daily., Disp: 90 tablet, Rfl: 3 .  omeprazole (PRILOSEC) 20 MG capsule, TAKE ONE CAPSULE BY MOUTH ONCE DAILY, Disp: 30 capsule, Rfl: 5 .  pravastatin (PRAVACHOL) 40 MG tablet, Take by mouth., Disp: , Rfl:  .  sitaGLIPtin (JANUVIA) 100 MG tablet, Take 1 tablet (100 mg total) by mouth daily., Disp: 90 tablet, Rfl: 1 .  spironolactone (ALDACTONE) 25 MG tablet, Take 1 tablet (25 mg total) by mouth daily., Disp: 90 tablet, Rfl: 1 .  triamcinolone cream (KENALOG) 0.1 %, APPLY  CREAM EXTERNALLY TWICE DAILY, Disp: 80 g, Rfl: 0 .  TRULICITY 7.49 SW/9.6PR SOPN, INJECT 1 SYRINGEFUL (0.75 MG) SUBCUTANEOUSLY ONCE A WEEK, Disp: 4 pen, Rfl: 1 .  verapamil (CALAN) 80 MG tablet, TAKE ONE TABLET BY MOUTH TWICE DAILY, Disp: 180 tablet, Rfl: 1 .  albuterol (PROVENTIL HFA) 108 (90  Base) MCG/ACT inhaler, Inhale 2 puffs into the lungs every 6 (six) hours as needed. (Patient not taking: Reported on 04/13/2017), Disp: 3 Inhaler, Rfl: 1 .  OMEGA-3 FATTY ACIDS PO, Take 1 tablet by mouth daily. , Disp: , Rfl:  .  omeprazole (PRILOSEC OTC) 20 MG tablet, Take by mouth., Disp: , Rfl:  .  tamoxifen (NOLVADEX) 10 MG tablet, Take 1 tablet (10 mg total) by mouth daily. (Patient not taking: Reported on 04/13/2017), Disp: 90 tablet, Rfl: 1  Review of Systems  Constitutional: Positive for activity change, chills and fatigue. Negative for fever.  HENT:  Positive for congestion, ear pain (Right ear pain), postnasal drip, sinus pressure, sinus pain, sneezing, sore throat, trouble swallowing and voice change.   Eyes:       Eye's watery-in the morning  Respiratory: Positive for cough. Negative for chest tightness, shortness of breath and wheezing.   Cardiovascular: Negative for chest pain, palpitations and leg swelling.  Gastrointestinal: Positive for nausea. Negative for abdominal pain, diarrhea and vomiting.  Musculoskeletal: Positive for neck pain (Right side).       Right side is achy  Neurological: Positive for light-headedness (a little). Negative for dizziness and headaches.    Social History   Tobacco Use  . Smoking status: Former Smoker    Packs/day: 0.25    Years: 21.00    Pack years: 5.25    Last attempt to quit: 06/08/1997    Years since quitting: 19.8  . Smokeless tobacco: Never Used  Substance Use Topics  . Alcohol use: No   Objective:   BP 138/70 (BP Location: Right Arm, Patient Position: Sitting, Cuff Size: Large)   Pulse 97   Temp 98.2 F (36.8 C) (Oral)   Resp 16   Wt 228 lb (103.4 kg)   SpO2 99%   BMI 46.05 kg/m    Physical Exam  Constitutional: She appears well-developed and well-nourished. No distress.  HENT:  Head: Normocephalic and atraumatic.  Right Ear: Hearing and ear canal normal. There is tenderness. Tympanic membrane is erythematous and bulging. Tympanic membrane is not scarred and not perforated. A middle ear effusion is present.  Left Ear: Hearing and ear canal normal. No tenderness. Tympanic membrane is not scarred, not perforated, not erythematous and not bulging.  No middle ear effusion.  Nose: Right sinus exhibits maxillary sinus tenderness and frontal sinus tenderness. Left sinus exhibits maxillary sinus tenderness and frontal sinus tenderness.  Mouth/Throat: Uvula is midline, oropharynx is clear and moist and mucous membranes are normal. No oropharyngeal exudate.  Neck: Normal range of  motion. Neck supple. No tracheal deviation present. No thyromegaly present.  Cardiovascular: Normal rate, regular rhythm and normal heart sounds. Exam reveals no gallop and no friction rub.  No murmur heard. Pulmonary/Chest: Effort normal and breath sounds normal. No stridor. No respiratory distress. She has no wheezes. She has no rales.  Lymphadenopathy:    She has no cervical adenopathy.  Skin: She is not diaphoretic.  Vitals reviewed.       Assessment & Plan:     1. Acute pansinusitis, recurrence not specified Worsening symptoms that have not responded to OTC medications. Will give doxycycline as below. Continue allergy medications. Stay well hydrated and get plenty of rest. Call if no symptom improvement or if symptoms worsen. - doxycycline (VIBRA-TABS) 100 MG tablet; Take 1 tablet (100 mg total) 2 (two) times daily by mouth.  Dispense: 20 tablet; Refill: 0  2. Acute suppurative otitis media of right ear without spontaneous  rupture of tympanic membrane, recurrence not specified See above medical treatment plan. - doxycycline (VIBRA-TABS) 100 MG tablet; Take 1 tablet (100 mg total) 2 (two) times daily by mouth.  Dispense: 20 tablet; Refill: 0  3. Uncomplicated asthma Stable. Diagnosis pulled for medication refill. Continue current medical treatment plan. - albuterol (PROVENTIL HFA) 108 (90 Base) MCG/ACT inhaler; Inhale 2 puffs every 6 (six) hours as needed into the lungs.  Dispense: 3 Inhaler; Refill: 1  4. DM type 2, uncontrolled, with neuropathy (HCC) Increased dose to 1.52m/0.5mL as below from 0.766m0.5mL for better diabetic control. - Dulaglutide (TRULICITY) 1.5 MGQL/7.3PVOPN; Inject 1.5 mg once a week into the skin.  Dispense: 4 pen; Refill: 3 EmbdenPA-C  [facility]

## 2017-04-13 NOTE — Patient Instructions (Signed)
Otitis Media, Adult Otitis media is redness, soreness, and puffiness (swelling) in the space just behind your eardrum (middle ear). It may be caused by allergies or infection. It often happens along with a cold. Follow these instructions at home:  Take your medicine as told. Finish it even if you start to feel better.  Only take over-the-counter or prescription medicines for pain, discomfort, or fever as told by your doctor.  Follow up with your doctor as told. Contact a doctor if:  You have otitis media only in one ear, or bleeding from your nose, or both.  You notice a lump on your neck.  You are not getting better in 3-5 days.  You feel worse instead of better. Get help right away if:  You have pain that is not helped with medicine.  You have puffiness, redness, or pain around your ear.  You get a stiff neck.  You cannot move part of your face (paralysis).  You notice that the bone behind your ear hurts when you touch it. This information is not intended to replace advice given to you by your health care provider. Make sure you discuss any questions you have with your health care provider. Document Released: 11/12/2007 Document Revised: 11/01/2015 Document Reviewed: 12/21/2012 Elsevier Interactive Patient Education  2017 [employer] Sinusitis, Adult Sinusitis is soreness and inflammation of your sinuses. Sinuses are hollow spaces in the bones around your face. They are located:  Around your eyes.  In the middle of your forehead.  Behind your nose.  In your cheekbones.  Your sinuses and nasal passages are lined with a stringy fluid (mucus). Mucus normally drains out of your sinuses. When your nasal tissues get inflamed or swollen, the mucus can get trapped or blocked so air cannot flow through your sinuses. This lets bacteria, viruses, and funguses grow, and that leads to infection. Follow these instructions at home: Medicines  Take, use, or apply over-the-counter  and prescription medicines only as told by your doctor. These may include nasal sprays.  If you were prescribed an antibiotic medicine, take it as told by your doctor. Do not stop taking the antibiotic even if you start to feel better. Hydrate and Humidify  Drink enough water to keep your pee (urine) clear or pale yellow.  Use a cool mist humidifier to keep the humidity level in your home above 50%.  Breathe in steam for 10-15 minutes, 3-4 times a day or as told by your doctor. You can do this in the bathroom while a hot shower is running.  Try not to spend time in cool or dry air. Rest  Rest as much as possible.  Sleep with your head raised (elevated).  Make sure to get enough sleep each night. General instructions  Put a warm, moist washcloth on your face 3-4 times a day or as told by your doctor. This will help with discomfort.  Wash your hands often with soap and water. If there is no soap and water, use hand sanitizer.  Do not smoke. Avoid being around people who are smoking (secondhand smoke).  Keep all follow-up visits as told by your doctor. This is important. Contact a doctor if:  You have a fever.  Your symptoms get worse.  Your symptoms do not get better within 10 days. Get help right away if:  You have a very bad headache.  You cannot stop throwing up (vomiting).  You have pain or swelling around your face or eyes.  You have trouble   seeing.  You feel confused.  Your neck is stiff.  You have trouble breathing. This information is not intended to replace advice given to you by your health care provider. Make sure you discuss any questions you have with your health care provider. Document Released: 11/12/2007 Document Revised: 01/20/2016 Document Reviewed: 03/21/2015 Elsevier Interactive Patient Education  2018 [employer] Inc[employer]  

## 2017-04-15 NOTE — Progress Notes (Signed)
This encounter was created in error - please disregard.

## 2017-04-16 DIAGNOSIS — E1142 Type 2 diabetes mellitus with diabetic polyneuropathy: Secondary | ICD-10-CM | POA: Diagnosis not present

## 2017-05-08 ENCOUNTER — Ambulatory Visit: Payer: Medicare HMO | Admitting: Physician Assistant

## 2017-05-08 ENCOUNTER — Encounter: Payer: Self-pay | Admitting: Physician Assistant

## 2017-05-08 VITALS — BP 150/88 | HR 80 | Temp 98.3°F | Resp 16 | Wt 230.2 lb

## 2017-05-08 DIAGNOSIS — M65342 Trigger finger, left ring finger: Secondary | ICD-10-CM | POA: Diagnosis not present

## 2017-05-08 DIAGNOSIS — E1140 Type 2 diabetes mellitus with diabetic neuropathy, unspecified: Secondary | ICD-10-CM

## 2017-05-08 DIAGNOSIS — E1165 Type 2 diabetes mellitus with hyperglycemia: Secondary | ICD-10-CM | POA: Diagnosis not present

## 2017-05-08 DIAGNOSIS — E1142 Type 2 diabetes mellitus with diabetic polyneuropathy: Secondary | ICD-10-CM

## 2017-05-08 DIAGNOSIS — I10 Essential (primary) hypertension: Secondary | ICD-10-CM | POA: Diagnosis not present

## 2017-05-08 DIAGNOSIS — E7800 Pure hypercholesterolemia, unspecified: Secondary | ICD-10-CM | POA: Diagnosis not present

## 2017-05-08 DIAGNOSIS — K219 Gastro-esophageal reflux disease without esophagitis: Secondary | ICD-10-CM

## 2017-05-08 DIAGNOSIS — IMO0002 Reserved for concepts with insufficient information to code with codable children: Secondary | ICD-10-CM

## 2017-05-08 LAB — POCT GLYCOSYLATED HEMOGLOBIN (HGB A1C)
Est. average glucose Bld gHb Est-mCnc: 192
Hemoglobin A1C: 8.3

## 2017-05-08 MED ORDER — OMEPRAZOLE 20 MG PO CPDR: 20.0000 mg | DELAYED_RELEASE_CAPSULE | Freq: Every day | ORAL | 1 refills | Status: DC

## 2017-05-08 MED ORDER — VERAPAMIL HCL 80 MG PO TABS: 80.0000 mg | ORAL_TABLET | Freq: Two times a day (BID) | ORAL | 1 refills | Status: DC

## 2017-05-08 MED ORDER — SPIRONOLACTONE 25 MG PO TABS: 25.0000 mg | ORAL_TABLET | Freq: Every day | ORAL | 1 refills | Status: DC

## 2017-05-08 MED ORDER — LISINOPRIL-HYDROCHLOROTHIAZIDE 20-12.5 MG PO TABS: ORAL_TABLET | ORAL | 1 refills | Status: DC

## 2017-05-08 MED ORDER — METFORMIN HCL 1000 MG PO TABS: 1000.0000 mg | ORAL_TABLET | Freq: Every day | ORAL | 1 refills | Status: DC

## 2017-05-08 MED ORDER — SITAGLIPTIN PHOSPHATE 100 MG PO TABS: 100.0000 mg | ORAL_TABLET | Freq: Every day | ORAL | 1 refills | Status: DC

## 2017-05-08 MED ORDER — LOVASTATIN 20 MG PO TABS: 40.0000 mg | ORAL_TABLET | Freq: Every day | ORAL | 1 refills | Status: DC

## 2017-05-08 NOTE — Patient Instructions (Signed)
Trigger Finger Trigger finger (stenosing tenosynovitis) is a condition that causes a finger to get stuck in a bent position. Each finger has a tough, cord-like tissue that connects muscle to bone (tendon), and each tendon is surrounded by a tunnel of tissue (tendon sheath). To move your finger, your tendon needs to slide freely through the sheath. Trigger finger happens when the tendon or the sheath thickens, making it difficult to move your finger. Trigger finger can affect any finger or a thumb. It may affect more than one finger. Mild cases may clear up with rest and medicine. Severe cases require more treatment. What are the causes? Trigger finger is caused by a thickened finger tendon or tendon sheath. The cause of this thickening is not known. What increases the risk? The following factors may make you more likely to develop this condition:  Doing activities that require a strong grip.  Having rheumatoid arthritis, gout, or diabetes.  Being 40-60 years old.  Being a woman.  What are the signs or symptoms? Symptoms of this condition include:  Pain when bending or straightening your finger.  Tenderness or swelling where your finger attaches to the palm of your hand.  A lump in the palm of your hand or on the inside of your finger.  Hearing a popping sound when you try to straighten your finger.  Feeling a popping, catching, or locking sensation when you try to straighten your finger.  Being unable to straighten your finger.  How is this diagnosed? This condition is diagnosed based on your symptoms and a physical exam. How is this treated? This condition may be treated by:  Resting your finger and avoiding activities that make symptoms worse.  Wearing a finger splint to keep your finger in a slightly bent position.  Taking NSAIDs to relieve pain and swelling.  Injecting medicine (steroids) into the tendon sheath to reduce swelling and irritation. Injections may need to be  repeated.  Having surgery to open the tendon sheath. This may be done if other treatments do not work and you cannot straighten your finger. You may need physical therapy after surgery.  Follow these instructions at home:  Use moist heat to help reduce pain and swelling as told by your health care provider.  Rest your finger and avoid activities that make pain worse. Return to normal activities as told by your health care provider.  If you have a splint, wear it as told by your health care provider.  Take over-the-counter and prescription medicines only as told by your health care provider.  Keep all follow-up visits as told by your health care provider. This is important. Contact a health care provider if:  Your symptoms are not improving with home care. Summary  Trigger finger (stenosing tenosynovitis) causes your finger to get stuck in a bent position, and it can make it difficult and painful to straighten your finger.  This condition develops when a finger tendon or tendon sheath thickens.  Treatment starts with resting, wearing a splint, and taking NSAIDs.  In severe cases, surgery to open the tendon sheath may be needed. This information is not intended to replace advice given to you by your health care provider. Make sure you discuss any questions you have with your health care provider. Document Released: 03/15/2004 Document Revised: 05/06/2016 Document Reviewed: 05/06/2016 Elsevier Interactive Patient Education  2017 [employer]  

## 2017-05-08 NOTE — Progress Notes (Signed)
Patient: Alexis Lloyd Female    DOB: 09-09-1960   56 y.o.   MRN: [id number] Visit Date: 05/08/2017  Today's Provider: Mar Daring, PA-C   Chief Complaint  Patient presents with  . Follow-up    Diabetes   Subjective:    HPI  Diabetes Mellitus Type II, Follow-up:   Lab Results  Component Value Date   HGBA1C 8.3 05/08/2017   HGBA1C 8.2 03/04/2017   HGBA1C 7.9 12/01/2016    Last seen for diabetes 2 months ago.  Management since then includes continue trulicity weekly, metformin 1050m once daily and januvia 1060monce daily. On 11/05 increased dose to 1.81m31m.81mL as below from 0.781m58m81mL for better diabetic control.She reports she has not started this. She reports excellent compliance with treatment. She is not having side effects.  Current symptoms include none and have been stable. Home blood sugar records: upper 170's-150's  Episodes of hypoglycemia? no   Current Insulin Regimen: Trulicity Most Recent Eye Exam: 10/2016 Weight trend: stable Current diet: well balanced Current exercise: housecleaning.Reports that she has stopped exercising.  Pertinent Labs:    Component Value Date/Time   CHOL 155 08/28/2016 1050   CHOL 158 07/18/2015 0855   TRIG 103 08/28/2016 1050   HDL 30 (L) 08/28/2016 1050   HDL 27 (L) 07/18/2015 0855   LDLCALC 104 (H) 08/28/2016 1050   LDLCALC 95 07/18/2015 0855   CREATININE 0.66 08/28/2016 1050   CREATININE 0.75 04/14/2014 0925    Wt Readings from Last 3 Encounters:  05/08/17 230 lb 3.2 oz (104.4 kg)  04/13/17 228 lb (103.4 kg)  03/04/17 226 lb 12.8 oz (102.9 kg)   ------------------------------------------------------------------------     Allergies  Allergen Reactions  . Allegra [Fexofenadine] Nausea And Vomiting  . Avelox [Moxifloxacin] Other (See Comments)    Hallucinations  . Bactrim [Sulfamethoxazole-Trimethoprim] Other (See Comments)    unknown  . Etodolac Nausea Only  . Fish-Derived Products Nausea  And Vomiting    With "seafood"  . Penicillins Diarrhea and Nausea And Vomiting  . Sulfa Antibiotics Other (See Comments)    unknown  . Macrobid [[employer]] Other (See Comments)    Constipation and globus hystericus.     Current Outpatient Medications:  .  albuterol (PROVENTIL HFA) 108 (90 Base) MCG/ACT inhaler, Inhale 2 puffs every 6 (six) hours as needed into the lungs., Disp: 3 Inhaler, Rfl: 1 .  amLODipine (NORVASC) 5 MG tablet, Take by mouth., Disp: , Rfl:  .  Blood Glucose Monitoring Suppl (ONE TOUCH ULTRA SYSTEM KIT) w/Device KIT, To check blood sugar daily, Disp: 1 each, Rfl: 0 .  cetirizine (ZYRTEC) 10 MG tablet, Take 1 tablet (10 mg total) by mouth daily., Disp: 90 tablet, Rfl: 1 .  Cholecalciferol (D-5000) 5000 UNITS TABS, Take 1 tablet by mouth daily. , Disp: , Rfl:  .  Dulaglutide (TRULICITY) 1.5 MG/0VW/9.7XYN, Inject 1.5 mg once a week into the skin., Disp: 4 pen, Rfl: 3 .  glucose blood (ONE TOUCH ULTRA TEST) test strip, To check blood sugar daily, Disp: 100 each, Rfl: 12 .  Lancets (ONETOUCH ULTRASOFT) lancets, To check blood sugar daily, Disp: 100 each, Rfl: 12 .  lisinopril-hydrochlorothiazide (PRINZIDE,ZESTORETIC) 20-12.5 MG tablet, Patient taking 2 TABLET BY MOUTH ONCE DAILY, Disp: 180 tablet, Rfl: 1 .  lovastatin (MEVACOR) 20 MG tablet, TAKE 2 TABLETS BY MOUTH ONCE DAILY, Disp: 180 tablet, Rfl: 1 .  meloxicam (MOBIC) 15 MG tablet, Take 1 tablet (15 mg total) by  mouth daily., Disp: 90 tablet, Rfl: 1 .  metFORMIN (GLUCOPHAGE) 1000 MG tablet, Take 1 tablet (1,000 mg total) by mouth daily with breakfast., Disp: 90 tablet, Rfl: 1 .  mometasone-formoterol (DULERA) 100-5 MCG/ACT AERO, Inhale 2 puffs into the lungs daily., Disp: 39 g, Rfl: 1 .  montelukast (SINGULAIR) 10 MG tablet, Take 1 tablet (10 mg total) by mouth daily., Disp: 90 tablet, Rfl: 3 .  omeprazole (PRILOSEC OTC) 20 MG tablet, Take by mouth., Disp: , Rfl:  .  omeprazole (PRILOSEC) 20 MG capsule,  TAKE ONE CAPSULE BY MOUTH ONCE DAILY, Disp: 30 capsule, Rfl: 5 .  pravastatin (PRAVACHOL) 40 MG tablet, Take by mouth., Disp: , Rfl:  .  sitaGLIPtin (JANUVIA) 100 MG tablet, Take 1 tablet (100 mg total) by mouth daily., Disp: 90 tablet, Rfl: 1 .  spironolactone (ALDACTONE) 25 MG tablet, Take 1 tablet (25 mg total) by mouth daily., Disp: 90 tablet, Rfl: 1 .  triamcinolone cream (KENALOG) 0.1 %, APPLY  CREAM EXTERNALLY TWICE DAILY, Disp: 80 g, Rfl: 0 .  verapamil (CALAN) 80 MG tablet, TAKE ONE TABLET BY MOUTH TWICE DAILY, Disp: 180 tablet, Rfl: 1 .  doxycycline (VIBRA-TABS) 100 MG tablet, Take 1 tablet (100 mg total) 2 (two) times daily by mouth. (Patient not taking: Reported on 05/08/2017), Disp: 20 tablet, Rfl: 0 .  OMEGA-3 FATTY ACIDS PO, Take 1 tablet by mouth daily. , Disp: , Rfl:  .  tamoxifen (NOLVADEX) 10 MG tablet, Take 1 tablet (10 mg total) by mouth daily. (Patient not taking: Reported on 04/13/2017), Disp: 90 tablet, Rfl: 1  Review of Systems  Constitutional: Negative.   Eyes: Negative for visual disturbance.  Respiratory: Negative for cough, chest tightness, shortness of breath and wheezing.   Cardiovascular: Negative for chest pain, palpitations and leg swelling.  Endocrine: Negative for cold intolerance, heat intolerance, polydipsia, polyphagia and polyuria.  Neurological: Negative for dizziness, light-headedness and headaches.    Social History   Tobacco Use  . Smoking status: Former Smoker    Packs/day: 0.25    Years: 21.00    Pack years: 5.25    Last attempt to quit: 06/08/1997    Years since quitting: 19.9  . Smokeless tobacco: Never Used  Substance Use Topics  . Alcohol use: No   Objective:   BP (!) 150/88 (BP Location: Right Arm, Patient Position: Sitting, Cuff Size: Large)   Pulse 80   Temp 98.3 F (36.8 C) (Oral)   Resp 16   Wt 230 lb 3.2 oz (104.4 kg)   BMI 46.49 kg/m    Physical Exam  Constitutional: She appears well-developed and well-nourished. No  distress.  Neck: Normal range of motion. Neck supple. No JVD present. No tracheal deviation present. No thyromegaly present.  Cardiovascular: Normal rate, regular rhythm and normal heart sounds. Exam reveals no gallop and no friction rub.  No murmur heard. Pulmonary/Chest: Effort normal and breath sounds normal. No respiratory distress. She has no wheezes. She has no rales.  Lymphadenopathy:    She has no cervical adenopathy.  Skin: She is not diaphoretic.  Vitals reviewed.       Assessment & Plan:     1. DM type 2, uncontrolled, with neuropathy (HCC) A1c up to 8.3 from 8.2. 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 She does report she just learned that her employer offers free membership to the [employer] so she  is going to start going with her clients. She has also been out of Januvia for about 1 month. She reports she is going to be getting her refills soon. She also did not increase Trulicity to 9.0WI. She is to continue Metformin 1015m daily, increase trulicity to 10.9BDand restart taking [place] I will see her back in 3 months for AWV.  - POCT glycosylated hemoglobin (Hb A1C)  2. Trigger ring finger of left hand Referral placed for evaluation of left ring triggering.  - Ambulatory referral to Orthopedic Surgery  3. Essential hypertension Elevated today. Patient feels elevated secondary to her new job and learning how to document on the computer. Will continue medications as below. I will see her back in 3 months for AWV and recheck BP.  - lisinopril-hydrochlorothiazide (PRINZIDE,ZESTORETIC) 20-12.5 MG tablet; Patient taking 2 TABLET BY MOUTH ONCE DAILY  Dispense: 180 tablet; Refill: 1 - spironolactone (ALDACTONE) 25 MG tablet; Take 1 tablet (25 mg total) by mouth daily.  Dispense: 90 tablet; Refill: 1 - verapamil (CALAN) 80 MG tablet; Take 1 tablet (80 mg total) by mouth 2 (two) times daily.   Dispense: 180 tablet; Refill: 1  4. Hypercholesterolemia Stable. Diagnosis pulled for medication refill. Continue current medical treatment plan. - lovastatin (MEVACOR) 20 MG tablet; Take 2 tablets (40 mg total) by mouth daily.  Dispense: 180 tablet; Refill: 1  5. Type 2 diabetes mellitus with diabetic polyneuropathy, without long-term current use of insulin (HCC) Stable. Diagnosis pulled for medication refill. Continue current medical treatment plan. See above medical treatment plan for #1.  - metFORMIN (GLUCOPHAGE) 1000 MG tablet; Take 1 tablet (1,000 mg total) by mouth daily with breakfast.  Dispense: 90 tablet; Refill: 1 - sitaGLIPtin (JANUVIA) 100 MG tablet; Take 1 tablet (100 mg total) by mouth daily.  Dispense: 90 tablet; Refill: 1  6. Gastroesophageal reflux disease, esophagitis presence not specified Stable. Diagnosis pulled for medication refill. Continue current medical treatment plan. - omeprazole (PRILOSEC) 20 MG capsule; Take 1 capsule (20 mg total) by mouth daily.  Dispense: 90 capsule; Refill: 1[place] PA-C  [facility]

## 2017-05-12 DIAGNOSIS — M65342 Trigger finger, left ring finger: Secondary | ICD-10-CM | POA: Diagnosis not present

## 2017-05-25 ENCOUNTER — Ambulatory Visit: Payer: Medicare HMO | Admitting: Family Medicine

## 2017-05-25 DIAGNOSIS — N760 Acute vaginitis: Secondary | ICD-10-CM | POA: Diagnosis not present

## 2017-05-25 DIAGNOSIS — K5900 Constipation, unspecified: Secondary | ICD-10-CM | POA: Diagnosis not present

## 2017-05-25 DIAGNOSIS — B9689 Other specified bacterial agents as the cause of diseases classified elsewhere: Secondary | ICD-10-CM | POA: Diagnosis not present

## 2017-05-25 DIAGNOSIS — R1010 Upper abdominal pain, unspecified: Secondary | ICD-10-CM

## 2017-05-25 LAB — POCT URINALYSIS DIPSTICK
Bilirubin, UA: NEGATIVE
Blood, UA: NEGATIVE
Glucose, UA: 500
Ketones, UA: NEGATIVE
Leukocytes, UA: NEGATIVE
Nitrite, UA: NEGATIVE
Spec Grav, UA: 1.02 (ref 1.010–1.025)
Urobilinogen, UA: 0.2 E.U./dL
pH, UA: 6 (ref 5.0–8.0)

## 2017-05-25 MED ORDER — METRONIDAZOLE 500 MG PO TABS: 500.0000 mg | ORAL_TABLET | Freq: Two times a day (BID) | ORAL | 0 refills | Status: DC

## 2017-05-25 MED ORDER — SENNOSIDES-DOCUSATE SODIUM 8.6-50 MG PO TABS: 2.0000 | ORAL_TABLET | Freq: Two times a day (BID) | ORAL | 0 refills | Status: DC

## 2017-05-25 NOTE — Patient Instructions (Signed)
Constipation, Adult °Constipation is when a person: °· Poops (has a bowel movement) fewer times in a week than normal. °· Has a hard time pooping. °· Has poop that is dry, hard, or bigger than normal. ° °Follow these instructions at home: °Eating and drinking ° °· Eat foods that have a lot of fiber, such as: °? Fresh fruits and vegetables. °? Whole grains. °? Beans. °· Eat less of foods that are high in fat, low in fiber, or overly processed, such as: °? French fries. °? Hamburgers. °? Cookies. °? Candy. °? Soda. °· Drink enough fluid to keep your pee (urine) clear or pale yellow. °General instructions °· Exercise regularly or as told by your doctor. °· Go to the restroom when you feel like you need to poop. Do not hold it in. °· Take over-the-counter and prescription medicines only as told by your doctor. These include any fiber supplements. °· Do pelvic floor retraining exercises, such as: °? Doing deep breathing while relaxing your lower belly (abdomen). °? Relaxing your pelvic floor while pooping. °· Watch your condition for any changes. °· Keep all follow-up visits as told by your doctor. This is important. °Contact a doctor if: °· You have pain that gets worse. °· You have a fever. °· You have not pooped for 4 days. °· You throw up (vomit). °· You are not hungry. °· You lose weight. °· You are bleeding from the anus. °· You have thin, pencil-like poop (stool). °Get help right away if: °· You have a fever, and your symptoms suddenly get worse. °· You leak poop or have blood in your poop. °· Your belly feels hard or bigger than normal (is bloated). °· You have very bad belly pain. °· You feel dizzy or you faint. °This information is not intended to replace advice given to you by your health care provider. Make sure you discuss any questions you have with your health care provider. °Document Released: 11/12/2007 Document Revised: 12/14/2015 Document Reviewed: 11/14/2015 °Elsevier Interactive Patient Education ©  2017 Elsevier Inc. ° °

## 2017-05-25 NOTE — Progress Notes (Signed)
Patient: Alexis Lloyd Female    DOB: Nov 27, 1960   56 y.o.   MRN: [id number] Visit Date: 05/26/2017  Today's Provider: Lavon Paganini, MD   Chief Complaint  Patient presents with  . Abdominal Pain   I, Emily Ratchford, CMA, am acting as scribe for Lavon Paganini, MD.  Subjective:    Abdominal Pain  This is a new problem. Episode onset: x 1 week. The problem has been unchanged. The pain is located in the generalized abdominal region (lower). The pain is at a severity of 6/10. The pain is moderate. The quality of the pain is aching. Associated symptoms include anorexia, belching, constipation, flatus, myalgias, nausea, vomiting and weight loss. Pertinent negatives include no diarrhea, dysuria, fever, frequency, hematochezia, hematuria or melena. Associated symptoms comments: Malodorous urine and back pain are present.. The pain is aggravated by eating. Treatments tried: Mylanta. The treatment provided no relief. hernia   Initially started with nausea as if she had eaten some bad food one week ago.  Describes a sour stomach without pain.  Pain (sharp, cramping) started 3-4 days ago with worsening nausea, along with vomiting and diarrhea.  Vomiting and diarrhea stopped 2 days ago.  Since that time, upper abd pain has worsened, along with abd bloating. No BMs since that time.  States Miralax does not work for [employer].  Denies fever, hematochezia, hematemesis, melena  States that she has a vaginal odor that is similar to last episode of BV.  States it is an infection.  Also thinks she must have a urine infection and wants a Urine culture sent on UA, because it must be infected.  Does not want vaginal exam today.  Sexually active with husband only.    Allergies  Allergen Reactions  . Allegra [Fexofenadine] Nausea And Vomiting  . Avelox [Moxifloxacin] Other (See Comments)    Hallucinations  . Bactrim [Sulfamethoxazole-Trimethoprim] Other (See Comments)    unknown  .  Etodolac Nausea Only  . Fish-Derived Products Nausea And Vomiting    With "seafood"  . Penicillins Diarrhea and Nausea And Vomiting  . Sulfa Antibiotics Other (See Comments)    unknown  . Macrobid [employer] Macro] Other (See Comments)    Constipation and globus hystericus.     Current Outpatient Medications:  .  albuterol (PROVENTIL HFA) 108 (90 Base) MCG/ACT inhaler, Inhale 2 puffs every 6 (six) hours as needed into the lungs., Disp: 3 Inhaler, Rfl: 1 .  amLODipine (NORVASC) 5 MG tablet, Take by mouth., Disp: , Rfl:  .  Blood Glucose Monitoring Suppl (ONE TOUCH ULTRA SYSTEM KIT) w/Device KIT, To check blood sugar daily, Disp: 1 each, Rfl: 0 .  cetirizine (ZYRTEC) 10 MG tablet, Take 1 tablet (10 mg total) by mouth daily., Disp: 90 tablet, Rfl: 1 .  Cholecalciferol (D-5000) 5000 UNITS TABS, Take 1 tablet by mouth daily. , Disp: , Rfl:  .  Dulaglutide (TRULICITY) 1.5 LH/7.3SK SOPN, Inject 1.5 mg once a week into the skin., Disp: 4 pen, Rfl: 3 .  glucose blood (ONE TOUCH ULTRA TEST) test strip, To check blood sugar daily, Disp: 100 each, Rfl: 12 .  Lancets (ONETOUCH ULTRASOFT) lancets, To check blood sugar daily, Disp: 100 each, Rfl: 12 .  lisinopril-hydrochlorothiazide (PRINZIDE,ZESTORETIC) 20-12.5 MG tablet, Patient taking 2 TABLET BY MOUTH ONCE DAILY, Disp: 180 tablet, Rfl: 1 .  lovastatin (MEVACOR) 20 MG tablet, Take 2 tablets (40 mg total) by mouth daily., Disp: 180 tablet, Rfl: 1 .  meloxicam (MOBIC) 15  MG tablet, Take 1 tablet (15 mg total) by mouth daily., Disp: 90 tablet, Rfl: 1 .  metFORMIN (GLUCOPHAGE) 1000 MG tablet, Take 1 tablet (1,000 mg total) by mouth daily with breakfast., Disp: 90 tablet, Rfl: 1 .  metroNIDAZOLE (FLAGYL) 500 MG tablet, Take 1 tablet (500 mg total) by mouth 2 (two) times daily., Disp: 14 tablet, Rfl: 0 .  mometasone-formoterol (DULERA) 100-5 MCG/ACT AERO, Inhale 2 puffs into the lungs daily., Disp: 39 g, Rfl: 1 .  montelukast (SINGULAIR) 10 MG  tablet, Take 1 tablet (10 mg total) by mouth daily., Disp: 90 tablet, Rfl: 3 .  OMEGA-3 FATTY ACIDS PO, Take 1 tablet by mouth daily. , Disp: , Rfl:  .  omeprazole (PRILOSEC) 20 MG capsule, Take 1 capsule (20 mg total) by mouth daily., Disp: 90 capsule, Rfl: 1 .  senna-docusate (SENOKOT-S) 8.6-50 MG tablet, Take 2 tablets by mouth 2 (two) times daily., Disp: 60 tablet, Rfl: 0 .  sitaGLIPtin (JANUVIA) 100 MG tablet, Take 1 tablet (100 mg total) by mouth daily., Disp: 90 tablet, Rfl: 1 .  spironolactone (ALDACTONE) 25 MG tablet, Take 1 tablet (25 mg total) by mouth daily., Disp: 90 tablet, Rfl: 1 .  triamcinolone cream (KENALOG) 0.1 %, APPLY  CREAM EXTERNALLY TWICE DAILY, Disp: 80 g, Rfl: 0 .  verapamil (CALAN) 80 MG tablet, Take 1 tablet (80 mg total) by mouth 2 (two) times daily., Disp: 180 tablet, Rfl: 1  Review of Systems  Constitutional: Positive for weight loss. Negative for fever.  Gastrointestinal: Positive for abdominal pain, anorexia, constipation, flatus, nausea and vomiting. Negative for diarrhea, hematochezia and melena.  Genitourinary: Negative for dysuria, frequency and hematuria.  Musculoskeletal: Positive for back pain and myalgias.    Social History   Tobacco Use  . Smoking status: Former Smoker    Packs/day: 0.25    Years: 21.00    Pack years: 5.25    Last attempt to quit: 06/08/1997    Years since quitting: 19.9  . Smokeless tobacco: Never Used  Substance Use Topics  . Alcohol use: No   Objective:   There were no vitals taken for this visit. There were no vitals filed for this visit.   Physical Exam  Constitutional: She is oriented to person, place, and time. She appears well-developed and well-nourished. No distress.  HENT:  Head: Normocephalic and atraumatic.  Nose: Nose normal.  Mouth/Throat: Oropharynx is clear and moist.  Eyes: Conjunctivae are normal. Pupils are equal, round, and reactive to light. No scleral icterus.  Neck: Neck supple.    Cardiovascular: Normal rate, regular rhythm, normal heart sounds and intact distal pulses.  No murmur heard. Pulmonary/Chest: Effort normal and breath sounds normal. No respiratory distress. She has no wheezes. She has no rales.  Abdominal: Soft. Bowel sounds are normal. She exhibits no distension. There is tenderness (mild TTP of upper abdomen). There is no rebound and no guarding.  Musculoskeletal: She exhibits no edema.  Lymphadenopathy:    She has no cervical adenopathy.  Neurological: She is alert and oriented to person, place, and time.  Psychiatric: She has a normal mood and affect. Her behavior is normal.  Vitals reviewed.  Results for orders placed or performed in visit on 05/25/17  POCT urinalysis dipstick  Result Value Ref Range   Color, UA yellow    Clarity, UA clear    Glucose, UA 500    Bilirubin, UA Negative    Ketones, UA Negative    Spec Grav, UA 1.020 1.010 -  1.025   Blood, UA Negative    pH, UA 6.0 5.0 - 8.0   Protein, UA Trace    Urobilinogen, UA 0.2 0.2 or 1.0 E.U./dL   Nitrite, UA Negative    Leukocytes, UA Negative Negative   Appearance     Odor         Assessment & Plan:     1. Pain of upper abdomen, 2. Constipation, unspecified constipation type - seems related to constipation after episode of likely viral gastro vs food poisoning now resolved - offered Miralax, but patient does not think this works - will try Senna-S - no evidence of UTI - discussed with patient   3. Bacterial vaginosis - patient describes BV and has previously had this as well - treat with Flagyl x7d - return precautions discussed - discussed that exam and swab will be necessary if does not improve      Return if symptoms worsen or fail to improve.  The entirety of the information documented in the History of Present Illness, Review of Systems and Physical Exam were personally obtained by me. Portions of this information were initially documented by Raquel Sarna Ratchford, CMA and  reviewed by me for thoroughness and accuracy.    Virginia Crews, MD, MPH [facility] 05/26/2017 9:16 AM

## 2017-06-16 ENCOUNTER — Telehealth: Payer: Self-pay | Admitting: Physician Assistant

## 2017-06-16 NOTE — Telephone Encounter (Signed)
Alexis Lloyd Self [phone]  Alexis Lloyd Patient Assistance program Enrollment form off to be filled out. She would like you to call as soon as form is ready, she is completely out of medication. Placed in doctors box.

## 2017-06-17 NOTE — Telephone Encounter (Signed)
Patient advised form is completed. Patient requested a copy of the form and she is going to come and pick up the copy to keep for her own record. Copy of the form placed up front ready for pick up.  Thanks,  -Egan Sahlin

## 2017-06-25 ENCOUNTER — Telehealth: Payer: Self-pay | Admitting: Physician Assistant

## 2017-06-25 NOTE — Telephone Encounter (Signed)
Please review. Thanks!  

## 2017-06-25 NOTE — Telephone Encounter (Signed)
Pt stated that she was advised last week that the forms for Alexis Lloyd an assistance program were mailed to [facility] last week. Pt stated that Alexis Lloyd has advised pt they haven't received them. Pt wanted to verify that the forms were mailed not faxed b/c they don't accept this document via fax. Pt also request that since they haven't received the form could we please re[place] [id number]  Please advise. Thanks TNP

## 2017-06-25 NOTE — Telephone Encounter (Signed)
Spoke with patient that paper work had been mailed since the day I spoke to her and that I do have a copy of her form.  Thanks,  -Standley Bargo

## 2017-06-30 ENCOUNTER — Ambulatory Visit: Payer: Medicare HMO | Admitting: Family Medicine

## 2017-06-30 ENCOUNTER — Encounter: Payer: Self-pay | Admitting: Family Medicine

## 2017-06-30 VITALS — BP 120/64 | HR 68 | Temp 98.1°F | Resp 16 | Wt 229.0 lb

## 2017-06-30 DIAGNOSIS — J069 Acute upper respiratory infection, unspecified: Secondary | ICD-10-CM

## 2017-06-30 DIAGNOSIS — E1165 Type 2 diabetes mellitus with hyperglycemia: Secondary | ICD-10-CM

## 2017-06-30 DIAGNOSIS — B079 Viral wart, unspecified: Secondary | ICD-10-CM

## 2017-06-30 DIAGNOSIS — IMO0002 Reserved for concepts with insufficient information to code with codable children: Secondary | ICD-10-CM

## 2017-06-30 DIAGNOSIS — E1140 Type 2 diabetes mellitus with diabetic neuropathy, unspecified: Secondary | ICD-10-CM

## 2017-06-30 NOTE — Progress Notes (Signed)
     Patient: Alexis Lloyd Female    DOB: 08/06/1960   56 y.o.   MRN: [id number] Visit Date: 06/30/2017  Today's Provider: Angela Bacigalupo, MD   I, Emily Ratchford, CMA, am acting as scribe for Angela Bacigalupo, MD.  Chief Complaint  Patient presents with  . URI   Subjective:    URI   This is a new problem. Episode onset: x 4 days. There has been no fever. Associated symptoms include congestion, coughing (productive), ear pain, headaches, rhinorrhea, sinus pain, sneezing, a sore throat and wheezing (H/O asthma). Pertinent negatives include no abdominal pain, chest pain, plugged ear sensation or swollen glands. She has tried nothing for the symptoms.   T2DM:  Pt is also concerned of blood sugars. States they are running in the 300's. Is out of Januvia, and the medical supply company [employer] has not mailed her the medications. This form was mailed to the company on 06/25/2017 from our office. See pt message. She states the Trulicity has not helped her lose weight, and caused abdominal pain.  She skipped her dose last week.  She is currently only taking Metformin and blood sugars have increased.  Spots on medial R foot - present for "a long time" - pruritic - tried triamcinolone as she thought they were psoriasis - wants derm referral    Allergies  Allergen Reactions  . Allegra [Fexofenadine] Nausea And Vomiting  . Avelox [Moxifloxacin] Other (See Comments)    Hallucinations  . Bactrim [Sulfamethoxazole-Trimethoprim] Other (See Comments)    unknown  . Etodolac Nausea Only  . Fish-Derived Products Nausea And Vomiting    With "seafood"  . Penicillins Diarrhea and Nausea And Vomiting  . Sulfa Antibiotics Other (See Comments)    unknown  . Macrobid [Nitrofurantoin Monohyd Macro] Other (See Comments)    Constipation and globus hystericus.     Current Outpatient Medications:  .  albuterol (PROVENTIL HFA) 108 (90 Base) MCG/ACT inhaler, Inhale 2 puffs every 6 (six) hours  as needed into the lungs., Disp: 3 Inhaler, Rfl: 1 .  amLODipine (NORVASC) 5 MG tablet, Take by mouth., Disp: , Rfl:  .  Blood Glucose Monitoring Suppl (ONE TOUCH ULTRA SYSTEM KIT) w/Device KIT, To check blood sugar daily, Disp: 1 each, Rfl: 0 .  cetirizine (ZYRTEC) 10 MG tablet, Take 1 tablet (10 mg total) by mouth daily., Disp: 90 tablet, Rfl: 1 .  glucose blood (ONE TOUCH ULTRA TEST) test strip, To check blood sugar daily, Disp: 100 each, Rfl: 12 .  Lancets (ONETOUCH ULTRASOFT) lancets, To check blood sugar daily, Disp: 100 each, Rfl: 12 .  lisinopril-hydrochlorothiazide (PRINZIDE,ZESTORETIC) 20-12.5 MG tablet, Patient taking 2 TABLET BY MOUTH ONCE DAILY, Disp: 180 tablet, Rfl: 1 .  lovastatin (MEVACOR) 20 MG tablet, Take 2 tablets (40 mg total) by mouth daily., Disp: 180 tablet, Rfl: 1 .  meloxicam (MOBIC) 15 MG tablet, Take 1 tablet (15 mg total) by mouth daily., Disp: 90 tablet, Rfl: 1 .  metFORMIN (GLUCOPHAGE) 1000 MG tablet, Take 1 tablet (1,000 mg total) by mouth daily with breakfast., Disp: 90 tablet, Rfl: 1 .  mometasone-formoterol (DULERA) 100-5 MCG/ACT AERO, Inhale 2 puffs into the lungs daily., Disp: 39 g, Rfl: 1 .  montelukast (SINGULAIR) 10 MG tablet, Take 1 tablet (10 mg total) by mouth daily., Disp: 90 tablet, Rfl: 3 .  spironolactone (ALDACTONE) 25 MG tablet, Take 1 tablet (25 mg total) by mouth daily., Disp: 90 tablet, Rfl: 1 .  triamcinolone cream (KENALOG) 0.1 %,   APPLY  CREAM EXTERNALLY TWICE DAILY, Disp: 80 g, Rfl: 0 .  verapamil (CALAN) 80 MG tablet, Take 1 tablet (80 mg total) by mouth 2 (two) times daily., Disp: 180 tablet, Rfl: 1 .  Dulaglutide (TRULICITY) 1.5 LM/7.8ML SOPN, Inject 1.5 mg once a week into the skin. (Patient not taking: Reported on 06/30/2017), Disp: 4 pen, Rfl: 3 .  senna-docusate (SENOKOT-S) 8.6-50 MG tablet, Take 2 tablets by mouth 2 (two) times daily. (Patient not taking: Reported on 06/30/2017), Disp: 60 tablet, Rfl: 0 .  sitaGLIPtin (JANUVIA) 100 MG  tablet, Take 1 tablet (100 mg total) by mouth daily. (Patient not taking: Reported on 06/30/2017), Disp: 90 tablet, Rfl: 1  Review of Systems  HENT: Positive for congestion, ear pain, rhinorrhea, sinus pain, sneezing and sore throat.   Respiratory: Positive for cough (productive) and wheezing (H/O asthma).   Cardiovascular: Negative for chest pain.  Gastrointestinal: Negative for abdominal pain.  Neurological: Positive for headaches.    Social History   Tobacco Use  . Smoking status: Former Smoker    Packs/day: 0.25    Years: 21.00    Pack years: 5.25    Last attempt to quit: 06/08/1997    Years since quitting: 20.0  . Smokeless tobacco: Never Used  Substance Use Topics  . Alcohol use: No   Objective:   BP 120/64 (BP Location: Left Arm, Patient Position: Sitting, Cuff Size: Large)   Pulse 68   Temp 98.1 F (36.7 C) (Oral)   Resp 16   Wt 229 lb (103.9 kg)   SpO2 98%   BMI 46.25 kg/m  Vitals:   06/30/17 1043  BP: 120/64  Pulse: 68  Resp: 16  Temp: 98.1 F (36.7 C)  TempSrc: Oral  SpO2: 98%  Weight: 229 lb (103.9 kg)     Physical Exam  Constitutional: She is oriented to person, place, and time. She appears well-developed and well-nourished. No distress.  HENT:  Head: Normocephalic and atraumatic.  Right Ear: Tympanic membrane, external ear and ear canal normal.  Left Ear: Tympanic membrane, external ear and ear canal normal.  Nose: Nose normal.  Mouth/Throat: Oropharynx is clear and moist. No oropharyngeal exudate.  Eyes: Conjunctivae and EOM are normal. Pupils are equal, round, and reactive to light. No scleral icterus.  Cardiovascular: Normal rate, regular rhythm, normal heart sounds and intact distal pulses.  No murmur heard. Pulmonary/Chest: Breath sounds normal. No respiratory distress. She has no wheezes.  Abdominal: Soft. She exhibits no distension. There is no tenderness.  Musculoskeletal: She exhibits no edema or deformity.  Neurological: She is alert  and oriented to person, place, and time.  Skin: Skin is warm and dry.  Dry skin over medial R foot with 4-5 warts No evidence of psoriasis  Psychiatric: She has a normal mood and affect. Her behavior is normal.  Vitals reviewed.      Assessment & Plan:      Problem List Items Addressed This Visit      Respiratory   Viral URI - Primary    No evidence of bacterial infection, such as AOM, CAP, strep pharnygitis Discussed natural course, symptomatic management and return precautions        Endocrine   DM type 2, uncontrolled, with neuropathy ([place])    Uncontrolled a1c slowly increasing over last year Hold metformin Samples given for Janumet XR 100/1000 (advised to take 1 tab daily) instead until patient receives Januvia from pharmacy Resume Trulicity F/u in 1 month with PCP as she was  supposed to for chronic DM management        Musculoskeletal and Integument   Warts of foot    Discussed that this is not psoriasis and c/w warts May be pruritic due to dry skin - discussed emolients Per patient request, referral to Derm Discussed option of cryotherapy, patient refused      Relevant Orders   Ambulatory referral to Dermatology       Return in about 4 weeks (around 07/28/2017) for diabetes f/u with PCP.   The entirety of the information documented in the History of Present Illness, Review of Systems and Physical Exam were personally obtained by me. Portions of this information were initially documented by Emily Ratchford, CMA and reviewed by me for thoroughness and accuracy.    Bacigalupo, Angela M, MD, MPH [facility] 07/01/2017 9:45 AM   

## 2017-06-30 NOTE — Patient Instructions (Addendum)
Try mucinex for congestion and cough Try nasal saline to decrease nasal congestion Can also try Delsym for cough if needed Tylenol can help with aches and pains   Upper Respiratory Infection, Adult Most upper respiratory infections (URIs) are caused by a virus. A URI affects the nose, throat, and upper air passages. The most common type of URI is often called "the common cold." Follow these instructions at home:  Take medicines only as told by your doctor.  Gargle warm saltwater or take cough drops to comfort your throat as told by your doctor.  Use a warm mist humidifier or inhale steam from a shower to increase air moisture. This may make it easier to breathe.  Drink enough fluid to keep your pee (urine) clear or pale yellow.  Eat soups and other clear broths.  Have a healthy diet.  Rest as needed.  Go back to work when your fever is gone or your doctor says it is okay. ? You may need to stay home longer to avoid giving your URI to others. ? You can also wear a face mask and wash your hands often to prevent spread of the virus.  Use your inhaler more if you have asthma.  Do not use any tobacco products, including cigarettes, chewing tobacco, or electronic cigarettes. If you need help quitting, ask your doctor. Contact a doctor if:  You are getting worse, not better.  Your symptoms are not helped by medicine.  You have chills.  You are getting more short of breath.  You have brown or red mucus.  You have yellow or brown discharge from your nose.  You have pain in your face, especially when you bend forward.  You have a fever.  You have puffy (swollen) neck glands.  You have pain while swallowing.  You have white areas in the back of your throat. Get help right away if:  You have very bad or constant: ? Headache. ? Ear pain. ? Pain in your forehead, behind your eyes, and over your cheekbones (sinus pain). ? Chest pain.  You have long-lasting (chronic)  lung disease and any of the following: ? Wheezing. ? Long-lasting cough. ? Coughing up blood. ? A change in your usual mucus.  You have a stiff neck.  You have changes in your: ? Vision. ? Hearing. ? Thinking. ? Mood. This information is not intended to replace advice given to you by your health care provider. Make sure you discuss any questions you have with your health care provider. Document Released: 11/12/2007 Document Revised: 01/27/2016 Document Reviewed: 08/31/2013 Elsevier Interactive Patient Education  2018 [employer].

## 2017-07-01 DIAGNOSIS — J069 Acute upper respiratory infection, unspecified: Secondary | ICD-10-CM | POA: Insufficient documentation

## 2017-07-01 NOTE — Assessment & Plan Note (Signed)
No evidence of bacterial infection, such as AOM, CAP, strep pharnygitis Discussed natural course, symptomatic management and return precautions

## 2017-07-01 NOTE — Assessment & Plan Note (Signed)
Uncontrolled a1c slowly increasing over last year Hold metformin Samples given for Janumet XR 100/1000 (advised to take 1 tab daily) instead until patient receives Januvia from pharmacy Resume Trulicity F/u in 1 month with PCP as she was supposed to for chronic DM management

## 2017-07-01 NOTE — Assessment & Plan Note (Signed)
Discussed that this is not psoriasis and c/w warts May be pruritic due to dry skin - discussed emolients Per patient request, referral to Derm Discussed option of cryotherapy, patient refused

## 2017-07-20 ENCOUNTER — Telehealth: Payer: Self-pay | Admitting: Physician Assistant

## 2017-07-20 NOTE — Telephone Encounter (Signed)
LMTCB regarding the form. Patient was told last time that original form was sent by mailed on the envelope provided and the address was already on the envelope. forms were attached to the envelope.

## 2017-07-20 NOTE — Telephone Encounter (Signed)
Pt stated that she was advised that [employer] didn't receive the application forms or the Rx for Januvia. Pt is requesting that we resend the forms and Rx via mail to the address on the forms. Pt is also requesting more samples of Janumet to last until she starts receiving Januvia through the program. Please advise. Thanks TNP

## 2017-07-21 NOTE — Telephone Encounter (Signed)
Spoke to patient and reports she is going to call tomorrow to see if they received the first form that was mailed. If they haven't received reports she is going to bring the other form that was sent to her again.She is also requesting samples of Janumet we have 2 of 100/1000 and plenty of 50/1000.  FYI: Patient called back and reports that she went ahead and called them and they informed her they received the form But are needing [place] state number. Patient gave me the following number to call [phone]. Called and spoke with [place].Gave the wrong state number at first and realize it and gave Lannette Donath the correct one which stated she had sent the information already with the first number that I gave her and couldn't go back to change it. Reports the pharmacy will be giving me a call.

## 2017-07-22 NOTE — Telephone Encounter (Signed)
We can give her the 50/1000 and have her take 2 daily. If she develops loose stools she can go to once daily.

## 2017-07-23 NOTE — Telephone Encounter (Signed)
Patient advised as directed below.  Thanks,  -Dezeray Puccio 

## 2017-08-05 ENCOUNTER — Telehealth: Payer: Self-pay | Admitting: Physician Assistant

## 2017-08-10 ENCOUNTER — Ambulatory Visit: Payer: Self-pay | Admitting: Physician Assistant

## 2017-08-12 ENCOUNTER — Encounter: Payer: Self-pay | Admitting: Physician Assistant

## 2017-08-12 ENCOUNTER — Ambulatory Visit: Payer: Medicare HMO | Admitting: Physician Assistant

## 2017-08-12 VITALS — BP 140/90 | HR 85 | Temp 98.4°F | Resp 16 | Wt 223.8 lb

## 2017-08-12 DIAGNOSIS — Z6841 Body Mass Index (BMI) 40.0 and over, adult: Secondary | ICD-10-CM | POA: Diagnosis not present

## 2017-08-12 DIAGNOSIS — K449 Diaphragmatic hernia without obstruction or gangrene: Secondary | ICD-10-CM

## 2017-08-12 DIAGNOSIS — K219 Gastro-esophageal reflux disease without esophagitis: Secondary | ICD-10-CM | POA: Diagnosis not present

## 2017-08-12 DIAGNOSIS — J0140 Acute pansinusitis, unspecified: Secondary | ICD-10-CM | POA: Diagnosis not present

## 2017-08-12 DIAGNOSIS — Z1211 Encounter for screening for malignant neoplasm of colon: Secondary | ICD-10-CM | POA: Diagnosis not present

## 2017-08-12 MED ORDER — DOXYCYCLINE HYCLATE 100 MG PO TABS: 100.0000 mg | ORAL_TABLET | Freq: Two times a day (BID) | ORAL | 0 refills | Status: DC

## 2017-08-12 MED ORDER — BENZONATATE 200 MG PO CAPS: 200.0000 mg | ORAL_CAPSULE | Freq: Two times a day (BID) | ORAL | 0 refills | Status: DC | PRN

## 2017-08-12 NOTE — Progress Notes (Signed)
Patient: Alexis Lloyd Female    DOB: [birth year]   57 y.o.   MRN: [id number] Visit Date: 08/12/2017  Today's Provider: Mar Daring, PA-C   Chief Complaint  Patient presents with  . URI   Subjective:    URI   This is a new problem. The current episode started 1 to 4 weeks ago (for the last week). The problem has been gradually worsening. Maximum temperature: "Feeling warm" Associated symptoms include abdominal pain, chest pain, congestion, coughing, headaches, nausea, rhinorrhea, sinus pain and wheezing. Pertinent negatives include no diarrhea, ear pain, sneezing or sore throat. Associated symptoms comments: Reports that when she coughs her chest hurts. She has tried decongestant for the symptoms. The treatment provided no relief.   She is also complaining of occasionally having her "stomach jump" and cause discomfort in her chest like indigestion. She has tried taking omeprazole but reports "it just comes back up." She reports being told she has a hiatal hernia and feels this may be what is causing these symptoms. She is requesting referral to GI for further evaluation stating "its been a while since I had that checked out, so maybe I should." She is also due for colonoscopy.     Allergies  Allergen Reactions  . Allegra [Fexofenadine] Nausea And Vomiting  . Avelox [Moxifloxacin] Other (See Comments)    Hallucinations  . Bactrim [Sulfamethoxazole-Trimethoprim] Other (See Comments)    unknown  . Etodolac Nausea Only  . Fish-Derived Products Nausea And Vomiting    With "seafood"  . Penicillins Diarrhea and Nausea And Vomiting  . Sulfa Antibiotics Other (See Comments)    unknown  . Macrobid [employer] Macro] Other (See Comments)    Constipation and globus hystericus.     Current Outpatient Medications:  .  Blood Glucose Monitoring Suppl (ONE TOUCH ULTRA SYSTEM KIT) w/Device KIT, To check blood sugar daily, Disp: 1 each, Rfl: 0 .  cetirizine (ZYRTEC) 10  MG tablet, Take 1 tablet (10 mg total) by mouth daily., Disp: 90 tablet, Rfl: 1 .  glucose blood (ONE TOUCH ULTRA TEST) test strip, To check blood sugar daily, Disp: 100 each, Rfl: 12 .  Lancets (ONETOUCH ULTRASOFT) lancets, To check blood sugar daily, Disp: 100 each, Rfl: 12 .  lisinopril-hydrochlorothiazide (PRINZIDE,ZESTORETIC) 20-12.5 MG tablet, Patient taking 2 TABLET BY MOUTH ONCE DAILY, Disp: 180 tablet, Rfl: 1 .  lovastatin (MEVACOR) 20 MG tablet, Take 2 tablets (40 mg total) by mouth daily., Disp: 180 tablet, Rfl: 1 .  meloxicam (MOBIC) 15 MG tablet, Take 1 tablet (15 mg total) by mouth daily., Disp: 90 tablet, Rfl: 1 .  metFORMIN (GLUCOPHAGE) 1000 MG tablet, Take 1 tablet (1,000 mg total) by mouth daily with breakfast., Disp: 90 tablet, Rfl: 1 .  montelukast (SINGULAIR) 10 MG tablet, Take 1 tablet (10 mg total) by mouth daily., Disp: 90 tablet, Rfl: 3 .  sitaGLIPtin (JANUVIA) 100 MG tablet, Take 1 tablet (100 mg total) by mouth daily., Disp: 90 tablet, Rfl: 1 .  spironolactone (ALDACTONE) 25 MG tablet, Take 1 tablet (25 mg total) by mouth daily., Disp: 90 tablet, Rfl: 1 .  verapamil (CALAN) 80 MG tablet, Take 1 tablet (80 mg total) by mouth 2 (two) times daily., Disp: 180 tablet, Rfl: 1 .  albuterol (PROVENTIL HFA) 108 (90 Base) MCG/ACT inhaler, Inhale 2 puffs every 6 (six) hours as needed into the lungs. (Patient not taking: Reported on 08/12/2017), Disp: 3 Inhaler, Rfl: 1 .  amLODipine (NORVASC) 5  MG tablet, Take by mouth., Disp: , Rfl:  .  Dulaglutide (TRULICITY) 1.5 YI/0.1KP SOPN, Inject 1.5 mg once a week into the skin. (Patient not taking: Reported on 06/30/2017), Disp: 4 pen, Rfl: 3 .  mometasone-formoterol (DULERA) 100-5 MCG/ACT AERO, Inhale 2 puffs into the lungs daily. (Patient not taking: Reported on 08/12/2017), Disp: 39 g, Rfl: 1 .  senna-docusate (SENOKOT-S) 8.6-50 MG tablet, Take 2 tablets by mouth 2 (two) times daily. (Patient not taking: Reported on 06/30/2017), Disp: 60 tablet,  Rfl: 0 .  triamcinolone cream (KENALOG) 0.1 %, APPLY  CREAM EXTERNALLY TWICE DAILY (Patient not taking: Reported on 08/12/2017), Disp: 80 g, Rfl: 0  Review of Systems  Constitutional: Positive for fatigue.  HENT: Positive for congestion, postnasal drip, rhinorrhea, sinus pressure and sinus pain. Negative for ear pain, sneezing and sore throat.   Eyes: Positive for discharge.  Respiratory: Positive for cough, chest tightness, shortness of breath and wheezing.   Cardiovascular: Positive for chest pain. Negative for palpitations and leg swelling.  Gastrointestinal: Positive for abdominal distention, abdominal pain and nausea. Negative for anal bleeding, blood in stool, constipation and diarrhea.  Neurological: Positive for light-headedness (When she stands up "I feel like I am going to fall forward) and headaches.    Social History   Tobacco Use  . Smoking status: Former Smoker    Packs/day: 0.25    Years: 21.00    Pack years: 5.25    Last attempt to quit: 06/08/1997    Years since quitting: 20.1  . Smokeless tobacco: Never Used  Substance Use Topics  . Alcohol use: No   Objective:   BP 140/90 (BP Location: Right Wrist, Patient Position: Sitting, Cuff Size: Normal)   Pulse 85   Temp 98.4 F (36.9 C) (Oral)   Resp 16   Wt 223 lb 12.8 oz (101.5 kg)   SpO2 97%   BMI 45.20 kg/m    Physical Exam  Constitutional: She appears well-developed and well-nourished. No distress.  Neck: Normal range of motion. Neck supple. No tracheal deviation present. No thyromegaly present.  Cardiovascular: Normal rate, regular rhythm and normal heart sounds. Exam reveals no gallop and no friction rub.  No murmur heard. Pulmonary/Chest: Effort normal and breath sounds normal. No respiratory distress. She has no wheezes. She has no rales.  Abdominal: Soft. Bowel sounds are normal. She exhibits no distension and no mass. There is no tenderness. There is no rebound and no guarding.  Lymphadenopathy:    She  has no cervical adenopathy.  Skin: She is not diaphoretic.  Vitals reviewed.      Assessment & Plan:     1. Colon cancer screening Referral placed.  - Ambulatory referral to Gastroenterology  2. Hiatal hernia Patient requesting referral for further evaluation of stomach issues and see if hiatal hernia is causing this current issue of stomach jumping causing chest pressure.  - Ambulatory referral to Gastroenterology  3. Gastroesophageal reflux disease, esophagitis presence not specified Reports intolerance to nexium and regurgitating up omeprazole.  - Ambulatory referral to Gastroenterology  4. Body mass index (BMI) of 45.0-49.9 in adult [facility]) Counseled patient on healthy lifestyle modifications including dieting and exercise. Has lost 6 pounds since last visit.   5. Acute pansinusitis, recurrence not specified Worsening symptoms that have not responded to OTC medications. Will give Doxycycline as below. Continue allergy medications. Stay well hydrated and get plenty of rest. Call if no symptom improvement or if symptoms worsen. - doxycycline (VIBRA-TABS) 100 MG tablet; Take 1  tablet (100 mg total) by mouth 2 (two) times daily.  Dispense: 20 tablet; Refill: 0 - benzonatate (TESSALON) 200 MG capsule; Take 1 capsule (200 mg total) by mouth 2 (two) times daily as needed for cough.  Dispense: 20 capsule; Refill: 0       Mar Daring, PA-C  [facility]

## 2017-08-27 ENCOUNTER — Ambulatory Visit: Payer: Medicare HMO | Admitting: Gastroenterology

## 2017-08-27 ENCOUNTER — Encounter: Payer: Self-pay | Admitting: Gastroenterology

## 2017-08-27 VITALS — BP 135/73 | HR 102 | Temp 98.0°F | Ht 59.5 in | Wt 220.0 lb

## 2017-08-27 DIAGNOSIS — R1310 Dysphagia, unspecified: Secondary | ICD-10-CM

## 2017-08-27 DIAGNOSIS — Z1211 Encounter for screening for malignant neoplasm of colon: Secondary | ICD-10-CM

## 2017-08-27 NOTE — Patient Instructions (Signed)
F/U 3 months Dates for colonoscopy: 4/9 thru 4/12   Gastroesophageal Reflux Disease, Adult Normally, food travels down the esophagus and stays in the stomach to be digested. If a person has gastroesophageal reflux disease (GERD), food and stomach acid move back up into the esophagus. When this happens, the esophagus becomes sore and swollen (inflamed). Over time, GERD can make small holes (ulcers) in the lining of the esophagus. Follow these instructions at home: Diet  Follow a diet as told by your doctor. You may need to avoid foods and drinks such as: ? Coffee and tea (with or without caffeine). ? Drinks that contain alcohol. ? Energy drinks and sports drinks. ? Carbonated drinks or sodas. ? Chocolate and cocoa. ? Peppermint and mint flavorings. ? Garlic and onions. ? Horseradish. ? Spicy and acidic foods, such as peppers, chili powder, curry powder, vinegar, hot sauces, and BBQ sauce. ? Citrus fruit juices and citrus fruits, such as oranges, lemons, and limes. ? Tomato-based foods, such as red sauce, chili, salsa, and pizza with red sauce. ? Fried and fatty foods, such as donuts, french fries, potato chips, and high-fat dressings. ? High-fat meats, such as hot dogs, rib eye steak, sausage, ham, and bacon. ? High-fat dairy items, such as whole milk, butter, and cream cheese.  Eat small meals often. Avoid eating large meals.  Avoid drinking large amounts of liquid with your meals.  Avoid eating meals during the 2-3 hours before bedtime.  Avoid lying down right after you eat.  Do not exercise right after you eat. General instructions  Pay attention to any changes in your symptoms.  Take over-the-counter and prescription medicines only as told by your doctor. Do not take aspirin, ibuprofen, or other NSAIDs unless your doctor says it is okay.  Do not use any tobacco products, including cigarettes, chewing tobacco, and e-cigarettes. If you need help quitting, ask your  doctor.  Wear loose clothes. Do not wear anything tight around your waist.  Raise (elevate) the head of your bed about 6 inches (15 cm).  Try to lower your stress. If you need help doing this, ask your doctor.  If you are overweight, lose an amount of weight that is healthy for you. Ask your doctor about a safe weight loss goal.  Keep all follow-up visits as told by your doctor. This is important. Contact a doctor if:  You have new symptoms.  You lose weight and you do not know why it is happening.  You have trouble swallowing, or it hurts to swallow.  You have wheezing or a cough that keeps happening.  Your symptoms do not get better with treatment.  You have a hoarse voice. Get help right away if:  You have pain in your arms, neck, jaw, teeth, or back.  You feel sweaty, dizzy, or light-headed.  You have chest pain or shortness of breath.  You throw up (vomit) and your throw up looks like blood or coffee grounds.  You pass out (faint).  Your poop (stool) is bloody or black.  You cannot swallow, drink, or eat. This information is not intended to replace advice given to you by your health care provider. Make sure you discuss any questions you have with your health care provider. Document Released: 11/12/2007 Document Revised: 11/01/2015 Document Reviewed: 09/20/2014 Elsevier Interactive Patient Education  [employer].

## 2017-08-28 ENCOUNTER — Other Ambulatory Visit: Payer: Self-pay

## 2017-08-28 DIAGNOSIS — Z1211 Encounter for screening for malignant neoplasm of colon: Secondary | ICD-10-CM

## 2017-08-28 DIAGNOSIS — R1310 Dysphagia, unspecified: Secondary | ICD-10-CM

## 2017-08-28 DIAGNOSIS — K219 Gastro-esophageal reflux disease without esophagitis: Secondary | ICD-10-CM

## 2017-08-28 MED ORDER — BISACODYL 5 MG PO TBEC: 10.0000 mg | DELAYED_RELEASE_TABLET | Freq: Once | ORAL | 0 refills | Status: AC

## 2017-08-28 MED ORDER — PEG 3350-KCL-NA BICARB-NACL 420 G PO SOLR: 4000.0000 mL | Freq: Once | ORAL | 0 refills | Status: AC

## 2017-08-31 ENCOUNTER — Ambulatory Visit (INDEPENDENT_AMBULATORY_CARE_PROVIDER_SITE_OTHER): Payer: Medicare HMO | Admitting: Physician Assistant

## 2017-08-31 ENCOUNTER — Encounter: Payer: Self-pay | Admitting: Physician Assistant

## 2017-08-31 VITALS — BP 100/70 | HR 78 | Temp 98.3°F | Resp 16 | Ht 59.0 in | Wt 219.2 lb

## 2017-08-31 DIAGNOSIS — E1140 Type 2 diabetes mellitus with diabetic neuropathy, unspecified: Secondary | ICD-10-CM

## 2017-08-31 DIAGNOSIS — I10 Essential (primary) hypertension: Secondary | ICD-10-CM

## 2017-08-31 DIAGNOSIS — E7800 Pure hypercholesterolemia, unspecified: Secondary | ICD-10-CM | POA: Diagnosis not present

## 2017-08-31 DIAGNOSIS — G4733 Obstructive sleep apnea (adult) (pediatric): Secondary | ICD-10-CM

## 2017-08-31 DIAGNOSIS — Z114 Encounter for screening for human immunodeficiency virus [HIV]: Secondary | ICD-10-CM

## 2017-08-31 DIAGNOSIS — Z1159 Encounter for screening for other viral diseases: Secondary | ICD-10-CM

## 2017-08-31 DIAGNOSIS — Z853 Personal history of malignant neoplasm of breast: Secondary | ICD-10-CM | POA: Diagnosis not present

## 2017-08-31 DIAGNOSIS — Z0000 Encounter for general adult medical examination without abnormal findings: Secondary | ICD-10-CM

## 2017-08-31 DIAGNOSIS — Z1272 Encounter for screening for malignant neoplasm of vagina: Secondary | ICD-10-CM | POA: Diagnosis not present

## 2017-08-31 DIAGNOSIS — Z6841 Body Mass Index (BMI) 40.0 and over, adult: Secondary | ICD-10-CM

## 2017-08-31 DIAGNOSIS — Z23 Encounter for immunization: Secondary | ICD-10-CM | POA: Diagnosis not present

## 2017-08-31 DIAGNOSIS — E1165 Type 2 diabetes mellitus with hyperglycemia: Secondary | ICD-10-CM

## 2017-08-31 DIAGNOSIS — E538 Deficiency of other specified B group vitamins: Secondary | ICD-10-CM | POA: Diagnosis not present

## 2017-08-31 DIAGNOSIS — IMO0002 Reserved for concepts with insufficient information to code with codable children: Secondary | ICD-10-CM

## 2017-08-31 DIAGNOSIS — R69 Illness, unspecified: Secondary | ICD-10-CM | POA: Diagnosis not present

## 2017-08-31 LAB — POCT UA - MICROALBUMIN: Microalbumin Ur, POC: 50 mg/L

## 2017-08-31 NOTE — Patient Instructions (Signed)
Health Maintenance for Postmenopausal Women Menopause is a normal process in which your reproductive ability comes to an end. This process happens gradually over a span of months to years, usually between the ages of 22 and 9. Menopause is complete when you have missed 12 consecutive menstrual periods. It is important to talk with your health care provider about some of the most common conditions that affect postmenopausal women, such as heart disease, cancer, and bone loss (osteoporosis). Adopting a healthy lifestyle and getting preventive care can help to promote your health and wellness. Those actions can also lower your chances of developing some of these common conditions. What should I know about menopause? During menopause, you may experience a number of symptoms, such as:  Moderate-to-severe hot flashes.  Night sweats.  Decrease in sex drive.  Mood swings.  Headaches.  Tiredness.  Irritability.  Memory problems.  Insomnia.  Choosing to treat or not to treat menopausal changes is an individual decision that you make with your health care provider. What should I know about hormone replacement therapy and supplements? Hormone therapy products are effective for treating symptoms that are associated with menopause, such as hot flashes and night sweats. Hormone replacement carries certain risks, especially as you become older. If you are thinking about using estrogen or estrogen with progestin treatments, discuss the benefits and risks with your health care provider. What should I know about heart disease and stroke? Heart disease, heart attack, and stroke become more likely as you age. This may be due, in part, to the hormonal changes that your body experiences during menopause. These can affect how your body processes dietary fats, triglycerides, and cholesterol. Heart attack and stroke are both medical emergencies. There are many things that you can do to help prevent heart disease  and stroke:  Have your blood pressure checked at least every 1-2 years. High blood pressure causes heart disease and increases the risk of stroke.  If you are 53-22 years old, ask your health care provider if you should take aspirin to prevent a heart attack or a stroke.  Do not use any tobacco products, including cigarettes, chewing tobacco, or electronic cigarettes. If you need help quitting, ask your health care provider.  It is important to eat a healthy diet and maintain a healthy weight. ? Be sure to include plenty of vegetables, fruits, low-fat dairy products, and lean protein. ? Avoid eating foods that are high in solid fats, added sugars, or salt (sodium).  Get regular exercise. This is one of the most important things that you can do for your health. ? Try to exercise for at least 150 minutes each week. The type of exercise that you do should increase your heart rate and make you sweat. This is known as moderate-intensity exercise. ? Try to do strengthening exercises at least twice each week. Do these in addition to the moderate-intensity exercise.  Know your numbers.Ask your health care provider to check your cholesterol and your blood glucose. Continue to have your blood tested as directed by your health care provider.  What should I know about cancer screening? There are several types of cancer. Take the following steps to reduce your risk and to catch any cancer development as early as possible. Breast Cancer  Practice breast self-awareness. ? This means understanding how your breasts normally appear and feel. ? It also means doing regular breast self-exams. Let your health care provider know about any changes, no matter how small.  If you are 40  or older, have a clinician do a breast exam (clinical breast exam or CBE) every year. Depending on your age, family history, and medical history, it may be recommended that you also have a yearly breast X-ray (mammogram).  If you  have a family history of breast cancer, talk with your health care provider about genetic screening.  If you are at high risk for breast cancer, talk with your health care provider about having an MRI and a mammogram every year.  Breast cancer (BRCA) gene test is recommended for women who have family members with BRCA-related cancers. Results of the assessment will determine the need for genetic counseling and BRCA1 and for BRCA2 testing. BRCA-related cancers include these types: ? Breast. This occurs in males or females. ? Ovarian. ? Tubal. This may also be called fallopian tube cancer. ? Cancer of the abdominal or pelvic lining (peritoneal cancer). ? Prostate. ? Pancreatic.  Cervical, Uterine, and Ovarian Cancer Your health care provider may recommend that you be screened regularly for cancer of the pelvic organs. These include your ovaries, uterus, and vagina. This screening involves a pelvic exam, which includes checking for microscopic changes to the surface of your cervix (Pap test).  For women ages 21-65, health care providers may recommend a pelvic exam and a Pap test every three years. For women ages 79-65, they may recommend the Pap test and pelvic exam, combined with testing for human papilloma virus (HPV), every five years. Some types of HPV increase your risk of cervical cancer. Testing for HPV may also be done on women of any age who have unclear Pap test results.  Other health care providers may not recommend any screening for nonpregnant women who are considered low risk for pelvic cancer and have no symptoms. Ask your health care provider if a screening pelvic exam is right for you.  If you have had past treatment for cervical cancer or a condition that could lead to cancer, you need Pap tests and screening for cancer for at least 20 years after your treatment. If Pap tests have been discontinued for you, your risk factors (such as having a new sexual partner) need to be  reassessed to determine if you should start having screenings again. Some women have medical problems that increase the chance of getting cervical cancer. In these cases, your health care provider may recommend that you have screening and Pap tests more often.  If you have a family history of uterine cancer or ovarian cancer, talk with your health care provider about genetic screening.  If you have vaginal bleeding after reaching menopause, tell your health care provider.  There are currently no reliable tests available to screen for ovarian cancer.  Lung Cancer Lung cancer screening is recommended for adults 69-62 years old who are at high risk for lung cancer because of a history of smoking. A yearly low-dose CT scan of the lungs is recommended if you:  Currently smoke.  Have a history of at least 30 pack-years of smoking and you currently smoke or have quit within the past 15 years. A pack-year is smoking an average of one pack of cigarettes per day for one year.  Yearly screening should:  Continue until it has been 15 years since you quit.  Stop if you develop a health problem that would prevent you from having lung cancer treatment.  Colorectal Cancer  This type of cancer can be detected and can often be prevented.  Routine colorectal cancer screening usually begins at  age 42 and continues through age 45.  If you have risk factors for colon cancer, your health care provider may recommend that you be screened at an earlier age.  If you have a family history of colorectal cancer, talk with your health care provider about genetic screening.  Your health care provider may also recommend using home test kits to check for hidden blood in your stool.  A small camera at the end of a tube can be used to examine your colon directly (sigmoidoscopy or colonoscopy). This is done to check for the earliest forms of colorectal cancer.  Direct examination of the colon should be repeated every  5-10 years until age 71. However, if early forms of precancerous polyps or small growths are found or if you have a family history or genetic risk for colorectal cancer, you may need to be screened more often.  Skin Cancer  Check your skin from head to toe regularly.  Monitor any moles. Be sure to tell your health care provider: ? About any new moles or changes in moles, especially if there is a change in a mole's shape or color. ? If you have a mole that is larger than the size of a pencil eraser.  If any of your family members has a history of skin cancer, especially at a young age, talk with your health care provider about genetic screening.  Always use sunscreen. Apply sunscreen liberally and repeatedly throughout the day.  Whenever you are outside, protect yourself by wearing long sleeves, pants, a wide-brimmed hat, and sunglasses.  What should I know about osteoporosis? Osteoporosis is a condition in which bone destruction happens more quickly than new bone creation. After menopause, you may be at an increased risk for osteoporosis. To help prevent osteoporosis or the bone fractures that can happen because of osteoporosis, the following is recommended:  If you are 46-71 years old, get at least 1,000 mg of calcium and at least 600 mg of vitamin D per day.  If you are older than age 55 but younger than age 65, get at least 1,200 mg of calcium and at least 600 mg of vitamin D per day.  If you are older than age 54, get at least 1,200 mg of calcium and at least 800 mg of vitamin D per day.  Smoking and excessive alcohol intake increase the risk of osteoporosis. Eat foods that are rich in calcium and vitamin D, and do weight-bearing exercises several times each week as directed by your health care provider. What should I know about how menopause affects my mental health? Depression may occur at any age, but it is more common as you become older. Common symptoms of depression  include:  Low or sad mood.  Changes in sleep patterns.  Changes in appetite or eating patterns.  Feeling an overall lack of motivation or enjoyment of activities that you previously enjoyed.  Frequent crying spells.  Talk with your health care provider if you think that you are experiencing depression. What should I know about immunizations? It is important that you get and maintain your immunizations. These include:  Tetanus, diphtheria, and pertussis (Tdap) booster vaccine.  Influenza every year before the flu season begins.  Pneumonia vaccine.  Shingles vaccine.  Your health care provider may also recommend other immunizations. This information is not intended to replace advice given to you by your health care provider. Make sure you discuss any questions you have with your health care provider. Document Released: 07/18/2005  Document Revised: 12/14/2015 Document Reviewed: 02/27/2015 [employer] Interactive Patient Education  2018 [employer]  

## 2017-08-31 NOTE — Progress Notes (Signed)
Patient: Alexis Lloyd, Female    DOB: [birth year], 59 y.o.   MRN: [id number] Visit Date: 08/31/2017  Today's Provider: Mar Daring, PA-C   Chief Complaint  Patient presents with  . Annual Exam   Subjective:    Annual physical exam Alexis Lloyd is a 57 y.o. female who presents today for health maintenance and complete physical. She feels well. She reports exercising once a week for one hour. She reports she is sleeping well.  08/08/16 CPE 08/02/14 Pap-normal 03/19/17 Mammogram-BI-RADS 2 Colonoscopy-09/11/17-with Dr. Bess Harvest Eye Exam-09/02/17 Eye Doctor appt scheduled -----------------------------------------------------------------   Review of Systems  Constitutional: Negative.   HENT: Negative.   Eyes: Positive for photophobia.  Respiratory: Negative.   Cardiovascular: Negative.   Gastrointestinal: Negative.   Endocrine: Negative.   Genitourinary: Negative.   Musculoskeletal: Negative.   Skin: Negative.   Allergic/Immunologic: Negative.   Neurological: Negative.   Hematological: Negative.   Psychiatric/Behavioral: Negative.     Social History      She  reports that she quit smoking about 20 years ago. She has a 5.25 pack-year smoking history. She has never used smokeless tobacco. She reports that she does not drink alcohol or use drugs.       Social History   Socioeconomic History  . Marital status: Married    Spouse name: Elberta Fortis  . Number of children: 2  . Years of education: some college  . Highest education level: Not on file  Occupational History    Employer: DISABLED  Social Needs  . Financial resource strain: Not hard at all  . Food insecurity:    Worry: Never true    Inability: Never true  . Transportation needs:    Medical: No    Non-medical: No  Tobacco Use  . Smoking status: Former Smoker    Packs/day: 0.25    Years: 21.00    Pack years: 5.25    Last attempt to quit: 06/08/1997    Years since quitting: 20.2   . Smokeless tobacco: Never Used  Substance and Sexual Activity  . Alcohol use: No  . Drug use: No  . Sexual activity: Yes  Lifestyle  . Physical activity:    Days per week: 1 day    Minutes per session: 60 min  . Stress: Only a little  Relationships  . Social connections:    Talks on phone: More than three times a week    Gets together: More than three times a week    Attends religious service: More than 4 times per year    Active member of club or organization: Yes    Attends meetings of clubs or organizations: More than 4 times per year    Relationship status: Married  Other Topics Concern  . Not on file  Social History Narrative  . Not on file    Past Medical History:  Diagnosis Date  . Allergy   . Arthritis   . Breast cancer ([place])   . Breast cancer, left breast ([place]) 10/04/2014  . Diabetes mellitus without complication ([place])   . GERD (gastroesophageal reflux disease)   . Hyperlipidemia   . Hypertension   . Osteoporosis   . Sleep apnea      Patient Active Problem List   Diagnosis Date Noted  . Warts of foot 06/30/2017  . Achilles tendon contracture 01/28/2017  . Hallux rigidus of left foot 01/28/2017  . Hallux rigidus, right foot 01/28/2017  . Pes planus 01/28/2017  .  Posterior tibial tendinitis of left lower extremity 01/28/2017  . Posterior tibial tendinitis of right lower extremity 01/28/2017  . Foot pain 07/16/2015  . DM type 2, uncontrolled, with neuropathy ([place]) 07/13/2015  . Abnormal finding on mammography, microcalcification 04/25/2015  . Intraductal carcinoma of breast 04/24/2015  . Airway hyperreactivity 11/18/2014  . Body mass index (BMI) of 45.0-49.9 in adult ([place]) 11/18/2014  . Breast cyst 11/18/2014  . Diabetic neuropathy ([place]) 11/18/2014  . GERD (gastroesophageal reflux disease) 11/18/2014  . 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 Hypercholesteremia 11/18/2014  . Hypertension 11/18/2014  . Adiposity 11/18/2014  . Arthritis, degenerative  11/18/2014  . Allergic rhinitis, seasonal 11/18/2014  . OSA (obstructive sleep apnea) 11/18/2014  . AC (acromioclavicular) arthritis 11/08/2014  . Personal history of breast cancer 10/04/2014  . Primary osteoarthritis of right knee 11/14/2013    Past Surgical History:  Procedure Laterality Date  . ABDOMINAL HYSTERECTOMY     due to fibroid tumors 1990 Dr. Quenten Raven  . BREAST BIOPSY Left    negative  . BREAST BIOPSY Left    04/2013 positive  . BREAST CYST ASPIRATION Left   . BREAST EXCISIONAL BIOPSY Left    2015  . BREAST LUMPECTOMY Left 06/17/2013  . BREAST REDUCTION SURGERY Bilateral   . CESAREAN SECTION     x's 2  . CHOLECYSTECTOMY  09/2001  . REDUCTION MAMMAPLASTY    . s/p radiation therapy Left    2015    Family History        Family Status  Relation Name Status  . Mother  Deceased at age 22       colon cancer  . Brother  Alive  . Father  Deceased       MI  . Sister  Deceased at age 96 days old       heart disorder  . Brother  Deceased at age 41       stroke  . Brother  Deceased at age 96       stomach cancer  . MGM  (Not Specified)        Her family history includes Allergic rhinitis in her brother; Asthma in her brother; Breast cancer in her maternal grandmother; Breast cancer (age of onset: 55) in her mother; Cancer in her mother; Hypertension in her brother and mother.      Allergies  Allergen Reactions  . Allegra [Fexofenadine] Nausea And Vomiting  . Avelox [Moxifloxacin] Other (See Comments)    Hallucinations  . Bactrim [Sulfamethoxazole-Trimethoprim] Other (See Comments)    unknown  . Etodolac Nausea Only  . Fish-Derived Products Nausea And Vomiting    With "seafood"  . Penicillins Diarrhea and Nausea And Vomiting  . Sulfa Antibiotics Other (See Comments)    unknown  . Macrobid [employer] Macro] Other (See Comments)    Constipation and globus hystericus.     Current Outpatient Medications:  .  albuterol (PROVENTIL HFA) 108 (90  Base) MCG/ACT inhaler, Inhale 2 puffs every 6 (six) hours as needed into the lungs., Disp: 3 Inhaler, Rfl: 1 .  Blood Glucose Monitoring Suppl (ONE TOUCH ULTRA SYSTEM KIT) w/Device KIT, To check blood sugar daily, Disp: 1 each, Rfl: 0 .  cetirizine (ZYRTEC) 10 MG tablet, Take 1 tablet (10 mg total) by mouth daily., Disp: 90 tablet, Rfl: 1 .  Dulaglutide (TRULICITY) 1.5 XK/4.8JE SOPN, Inject 1.5 mg once a week into the skin., Disp: 4 pen, Rfl: 3 .  glucose blood (ONE TOUCH ULTRA TEST) test strip, To check  blood sugar daily, Disp: 100 each, Rfl: 12 .  Lancets (ONETOUCH ULTRASOFT) lancets, To check blood sugar daily, Disp: 100 each, Rfl: 12 .  lisinopril-hydrochlorothiazide (PRINZIDE,ZESTORETIC) 20-12.5 MG tablet, Patient taking 2 TABLET BY MOUTH ONCE DAILY, Disp: 180 tablet, Rfl: 1 .  lovastatin (MEVACOR) 20 MG tablet, Take 2 tablets (40 mg total) by mouth daily. (Patient taking differently: Take 20 mg by mouth daily. ), Disp: 180 tablet, Rfl: 1 .  meloxicam (MOBIC) 15 MG tablet, Take 1 tablet (15 mg total) by mouth daily., Disp: 90 tablet, Rfl: 1 .  metFORMIN (GLUCOPHAGE) 1000 MG tablet, Take 1 tablet (1,000 mg total) by mouth daily with breakfast., Disp: 90 tablet, Rfl: 1 .  mometasone-formoterol (DULERA) 100-5 MCG/ACT AERO, Inhale 2 puffs into the lungs daily., Disp: 39 g, Rfl: 1 .  montelukast (SINGULAIR) 10 MG tablet, Take 1 tablet (10 mg total) by mouth daily., Disp: 90 tablet, Rfl: 3 .  sitaGLIPtin (JANUVIA) 100 MG tablet, Take 1 tablet (100 mg total) by mouth daily., Disp: 90 tablet, Rfl: 1 .  spironolactone (ALDACTONE) 25 MG tablet, Take 1 tablet (25 mg total) by mouth daily., Disp: 90 tablet, Rfl: 1 .  triamcinolone cream (KENALOG) 0.1 %, APPLY  CREAM EXTERNALLY TWICE DAILY, Disp: 80 g, Rfl: 0 .  verapamil (CALAN) 80 MG tablet, Take 1 tablet (80 mg total) by mouth 2 (two) times daily. (Patient taking differently: Take 80 mg by mouth once. ), Disp: 180 tablet, Rfl: 1   Patient Care  Team: Mar Daring, PA-C as PCP - General (Family Medicine)      Objective:   Vitals: BP 100/70 (BP Location: Right Arm, Patient Position: Sitting, Cuff Size: Large)   Pulse 78   Temp 98.3 F (36.8 C) (Oral)   Resp 16   Ht _0  (1.499 m)   Wt 219 lb 3.2 oz (99.4 kg)   SpO2 99%   BMI 44.27 kg/m    Vitals:   08/31/17 0955  BP: 100/70  Pulse: 78  Resp: 16  Temp: 98.3 F (36.8 C)  TempSrc: Oral  SpO2: 99%  Weight: 219 lb 3.2 oz (99.4 kg)  Height: _1  (1.499 m)     Physical Exam  Constitutional: She is oriented to person, place, and time. She appears well-developed and well-nourished. No distress.  HENT:  Head: Normocephalic and atraumatic.  Right Ear: Hearing, tympanic membrane, external ear and ear canal normal.  Left Ear: Hearing, tympanic membrane, external ear and ear canal normal.  Nose: Nose normal.  Mouth/Throat: Uvula is midline, oropharynx is clear and moist and mucous membranes are normal. No oropharyngeal exudate.  Eyes: Pupils are equal, round, and reactive to light. Conjunctivae and EOM are normal. Right eye exhibits no discharge. Left eye exhibits no discharge. No scleral icterus.  Neck: Normal range of motion. Neck supple. No JVD present. Carotid bruit is not present. No tracheal deviation present. No thyromegaly present.  Cardiovascular: Normal rate, regular rhythm, normal heart sounds and intact distal pulses. Exam reveals no gallop and no friction rub.  No murmur heard. Pulmonary/Chest: Effort normal and breath sounds normal. No respiratory distress. She has no wheezes. She has no rales. She exhibits no tenderness. Right breast exhibits no inverted nipple, no mass, no nipple discharge, no skin change and no tenderness. Left breast exhibits no inverted nipple, no mass, no nipple discharge, no skin change and no tenderness. No breast swelling, tenderness, discharge or bleeding. Breasts are symmetrical.    Abdominal: Soft. Bowel sounds are  normal.  She exhibits no distension and no mass. There is no tenderness. There is no rebound and no guarding. Hernia confirmed negative in the right inguinal area and confirmed negative in the left inguinal area.  Genitourinary: Rectum normal, vagina normal and uterus normal. No breast swelling, tenderness, discharge or bleeding. Pelvic exam was performed with patient supine. There is no rash, tenderness, lesion or injury on the right labia. There is no rash, tenderness, lesion or injury on the left labia. Cervix exhibits no motion tenderness, no discharge and no friability. Right adnexum displays no mass, no tenderness and no fullness. Left adnexum displays no mass, no tenderness and no fullness. No erythema, tenderness or bleeding in the vagina. No signs of injury around the vagina. No vaginal discharge found.  Musculoskeletal: Normal range of motion. She exhibits no edema or tenderness.  Lymphadenopathy:    She has no cervical adenopathy.       Right: No inguinal adenopathy present.       Left: No inguinal adenopathy present.  Neurological: She is alert and oriented to person, place, and time. She has normal reflexes. No cranial nerve deficit. Coordination normal.  Skin: Skin is warm and dry. No rash noted. She is not diaphoretic.  Psychiatric: She has a normal mood and affect. Her behavior is normal. Judgment and thought content normal.  Vitals reviewed.    Depression Screen PHQ 2/9 Scores 08/31/2017 01/15/2017 08/28/2016 08/06/2015  PHQ - 2 Score 0 2 0 0  PHQ- 9 Score 1 8 0 0      Assessment & Plan:     Routine Health Maintenance and Physical Exam  Exercise Activities and Dietary recommendations Goals    None      Immunization History  Administered Date(s) Administered  . Pneumococcal Polysaccharide-23 06/22/2009, 08/06/2015    Health Maintenance  Topic Date Due  . Hepatitis C Screening  April 09, 1961  . OPHTHALMOLOGY EXAM  10/04/1968  . HIV Screening  10/04/1973  . COLONOSCOPY   06/10/2017  . PAP SMEAR  08/02/2017  . INFLUENZA VACCINE  01/08/2018 (Originally 01/07/2017)  . TETANUS/TDAP  05/08/2018 (Originally 10/04/1977)  . HEMOGLOBIN A1C  11/05/2017  . FOOT EXAM  02/04/2018  . MAMMOGRAM  02/19/2018  . PNEUMOCOCCAL POLYSACCHARIDE VACCINE  Completed     Discussed health benefits of physical activity, and encouraged her to engage in regular exercise appropriate for her age and condition.    1. Annual physical exam Normal physical exam today. Will check labs as below and f/u pending lab results. If labs are stable and WNL she will not need to have these rechecked for one year at her next annual physical exam. She is to call the office in the meantime if she has any acute issue, questions or concerns.  2. Screening for vaginal cancer S/P Hysterectomy. Pap collected today. Will send as below and f/u pending results. - Pap IG and HPV (high risk) DNA detection  3. Screening for HIV (human immunodeficiency virus) - HIV antibody  4. Need for hepatitis C screening test - Hepatitis C antibody  5. DM type 2, uncontrolled, with neuropathy ([place]) Control improving. Becoming more compliant with medications. Microalbumin stable. Taking Janumet 50-578m daily. Has not received her Januvia for company yet. Has been getting samples. Also taking trulicity 12.9JM/4QAweekly. Will check labs as below and f/u pending results. I will see her back in 3 months for A1c recheck.  - POCT UA - Microalbumin - CBC with Differential/Platelet - Hemoglobin A1c - Lipid Panel With  LDL/HDL Ratio - TSH  6. Essential hypertension Improving with better medication compliance. Now taking all medications at bedtime as she remembers them better at bedtime. Taking verapamil 59m once daily, lisinopril-hctz 20-12.587m Will check labs as below and f/u pending results. - CBC with Differential/Platelet - Comprehensive metabolic panel - Hemoglobin A1c - Lipid Panel With LDL/HDL Ratio - TSH  7.  Hypercholesterolemia Stable on lovastatin 2016mWill check labs as below and f/u pending results. - CBC with Differential/Platelet - Comprehensive metabolic panel - Hemoglobin A1c - Lipid Panel With LDL/HDL Ratio  8. B12 deficiency H/O deficiency. Not on supplement. Will check labs as below and f/u pending results. - CBC with Differential/Platelet - Comprehensive metabolic panel - Vitamin B12P79. BMI 40.0-44.9, adult ([facility] on healthy lifestyle modifications including dieting and exercise.  Patient going to healthy living class and doing zumba once weekly.  - Comprehensive metabolic panel - Hemoglobin A1c - Lipid Panel With LDL/HDL Ratio - TSH  10. OSA (obstructive sleep apnea) Stable.   11. Personal history of breast cancer Left breast in 2013. Cleared from surgeon and taken off tamoxifen last November. Will continue care here and have mammograms ordered here now.   12. Need for Tdap vaccination Tdap Vaccine given to patient without complications. Patient sat for 15 minutes after administration and was tolerated well without adverse effects. - Tdap vaccine greater than or equal to 7yo IM  --------------------------------------------------------------------    JenMar DaringA-C  [facility]

## 2017-09-01 LAB — COMPREHENSIVE METABOLIC PANEL
ALT: 32 IU/L (ref 0–32)
AST: 13 IU/L (ref 0–40)
Albumin/Globulin Ratio: 1.2 (ref 1.2–2.2)
Albumin: 4.1 g/dL (ref 3.5–5.5)
Alkaline Phosphatase: 91 IU/L (ref 39–117)
BUN/Creatinine Ratio: 14 (ref 9–23)
BUN: 10 mg/dL (ref 6–24)
Bilirubin Total: 0.5 mg/dL (ref 0.0–1.2)
CO2: 24 mmol/L (ref 20–29)
Calcium: 9.8 mg/dL (ref 8.7–10.2)
Chloride: 98 mmol/L (ref 96–106)
Creatinine, Ser: 0.71 mg/dL (ref 0.57–1.00)
GFR calc Af Amer: 109 mL/min/{1.73_m2} (ref >59)
GFR calc non Af Amer: 94 mL/min/{1.73_m2} (ref >59)
Globulin, Total: 3.3 g/dL (ref 1.5–4.5)
Glucose: 133 mg/dL — ABNORMAL HIGH (ref 65–99)
Potassium: 3.9 mmol/L (ref 3.5–5.2)
Sodium: 139 mmol/L (ref 134–144)
Total Protein: 7.4 g/dL (ref 6.0–8.5)

## 2017-09-01 LAB — CBC WITH DIFFERENTIAL/PLATELET
Basophils Absolute: 0 10*3/uL (ref 0.0–0.2)
Basos: 0 %
EOS (ABSOLUTE): 0.1 10*3/uL (ref 0.0–0.4)
Eos: 1 %
Hematocrit: 34.8 % (ref 34.0–46.6)
Hemoglobin: 11.7 g/dL (ref 11.1–15.9)
Immature Grans (Abs): 0 10*3/uL (ref 0.0–0.1)
Immature Granulocytes: 0 %
Lymphocytes Absolute: 3.1 10*3/uL (ref 0.7–3.1)
Lymphs: 33 %
MCH: 29.1 pg (ref 26.6–33.0)
MCHC: 33.6 g/dL (ref 31.5–35.7)
MCV: 87 fL (ref 79–97)
Monocytes Absolute: 0.3 10*3/uL (ref 0.1–0.9)
Monocytes: 4 %
Neutrophils Absolute: 5.9 10*3/uL (ref 1.4–7.0)
Neutrophils: 62 %
Platelets: 289 10*3/uL (ref 150–379)
RBC: 4.02 x10E6/uL (ref 3.77–5.28)
RDW: 14.3 % (ref 12.3–15.4)
WBC: 9.4 10*3/uL (ref 3.4–10.8)

## 2017-09-01 LAB — LIPID PANEL WITH LDL/HDL RATIO
Cholesterol, Total: 164 mg/dL (ref 100–199)
HDL: 28 mg/dL — ABNORMAL LOW (ref >39)
LDL Calculated: 113 mg/dL — ABNORMAL HIGH (ref 0–99)
LDl/HDL Ratio: 4 ratio — ABNORMAL HIGH (ref 0.0–3.2)
Triglycerides: 117 mg/dL (ref 0–149)
VLDL Cholesterol Cal: 23 mg/dL (ref 5–40)

## 2017-09-01 LAB — HIV ANTIBODY (ROUTINE TESTING W REFLEX): HIV Screen 4th Generation wRfx: NONREACTIVE

## 2017-09-01 LAB — HEPATITIS C ANTIBODY: Hep C Virus Ab: <0.1 s/co ratio (ref 0.0–0.9)

## 2017-09-01 LAB — HEMOGLOBIN A1C
Est. average glucose Bld gHb Est-mCnc: 212 mg/dL
Hgb A1c MFr Bld: 9 % — ABNORMAL HIGH (ref 4.8–5.6)

## 2017-09-01 LAB — TSH: TSH: 2.51 u[IU]/mL (ref 0.450–4.500)

## 2017-09-01 LAB — VITAMIN B12: Vitamin B-12: 461 pg/mL (ref 232–1245)

## 2017-09-02 ENCOUNTER — Telehealth: Payer: Self-pay

## 2017-09-02 LAB — PAP IG AND HPV HIGH-RISK
HPV, high-risk: NEGATIVE
PAP Smear Comment: 0

## 2017-09-02 NOTE — Telephone Encounter (Signed)
-----   Message from Mar Daring, [place] sent at 09/02/2017  8:54 AM EDT ----- Blood count normal, Kidney and liver function normal, Hep C negative, cholesterol holding pretty steady, B12 normal, thyroid normal, A1c up to 9.0. This is highest it has been in one year. Take janumet 1 pill twice daily or take both pills at the same time. Also make sure to do Trulicity injection once weekly.

## 2017-09-02 NOTE — Telephone Encounter (Signed)
Patient advised as directed below.  Thanks,  -Nesbit Michon 

## 2017-09-03 ENCOUNTER — Telehealth: Payer: Self-pay

## 2017-09-03 NOTE — Telephone Encounter (Signed)
Patient advised as below.  

## 2017-09-03 NOTE — Telephone Encounter (Signed)
-----   Message from Mar Daring, PA-C sent at 09/03/2017  8:31 AM EDT ----- Pap is normal, HPV negative.  Will repeat in 5 years.

## 2017-09-07 ENCOUNTER — Other Ambulatory Visit: Payer: Self-pay

## 2017-09-10 NOTE — Telephone Encounter (Signed)
complete

## 2017-09-11 ENCOUNTER — Ambulatory Visit
Admission: RE | Admit: 2017-09-11 | Discharge: 2017-09-11 | Disposition: A | Discharge status: Home / Self Care | Payer: Medicare HMO | Source: Ambulatory Visit | Attending: Gastroenterology | Admitting: Gastroenterology

## 2017-09-11 ENCOUNTER — Ambulatory Visit: Payer: Medicare HMO | Admitting: Certified Registered"

## 2017-09-11 ENCOUNTER — Encounter
Admission: RE | Disposition: A | Discharge status: Home / Self Care | Payer: Self-pay | Source: Ambulatory Visit | Attending: Gastroenterology

## 2017-09-11 ENCOUNTER — Encounter: Payer: Self-pay | Admitting: *Deleted

## 2017-09-11 DIAGNOSIS — Z791 Long term (current) use of non-steroidal anti-inflammatories (NSAID): Secondary | ICD-10-CM | POA: Diagnosis not present

## 2017-09-11 DIAGNOSIS — I10 Essential (primary) hypertension: Secondary | ICD-10-CM | POA: Insufficient documentation

## 2017-09-11 DIAGNOSIS — B9681 Helicobacter pylori [H. pylori] as the cause of diseases classified elsewhere: Secondary | ICD-10-CM | POA: Insufficient documentation

## 2017-09-11 DIAGNOSIS — Z923 Personal history of irradiation: Secondary | ICD-10-CM | POA: Diagnosis not present

## 2017-09-11 DIAGNOSIS — Z886 Allergy status to analgesic agent status: Secondary | ICD-10-CM | POA: Insufficient documentation

## 2017-09-11 DIAGNOSIS — Z853 Personal history of malignant neoplasm of breast: Secondary | ICD-10-CM | POA: Diagnosis not present

## 2017-09-11 DIAGNOSIS — K2950 Unspecified chronic gastritis without bleeding: Secondary | ICD-10-CM | POA: Diagnosis not present

## 2017-09-11 DIAGNOSIS — Z7984 Long term (current) use of oral hypoglycemic drugs: Secondary | ICD-10-CM | POA: Insufficient documentation

## 2017-09-11 DIAGNOSIS — E785 Hyperlipidemia, unspecified: Secondary | ICD-10-CM | POA: Insufficient documentation

## 2017-09-11 DIAGNOSIS — K5790 Diverticulosis of intestine, part unspecified, without perforation or abscess without bleeding: Secondary | ICD-10-CM | POA: Diagnosis not present

## 2017-09-11 DIAGNOSIS — K5730 Diverticulosis of large intestine without perforation or abscess without bleeding: Secondary | ICD-10-CM | POA: Diagnosis not present

## 2017-09-11 DIAGNOSIS — K3189 Other diseases of stomach and duodenum: Secondary | ICD-10-CM | POA: Diagnosis not present

## 2017-09-11 DIAGNOSIS — K219 Gastro-esophageal reflux disease without esophagitis: Secondary | ICD-10-CM | POA: Diagnosis not present

## 2017-09-11 DIAGNOSIS — Z881 Allergy status to other antibiotic agents status: Secondary | ICD-10-CM | POA: Diagnosis not present

## 2017-09-11 DIAGNOSIS — K648 Other hemorrhoids: Secondary | ICD-10-CM | POA: Diagnosis not present

## 2017-09-11 DIAGNOSIS — Z91013 Allergy to seafood: Secondary | ICD-10-CM | POA: Diagnosis not present

## 2017-09-11 DIAGNOSIS — Z880 Allergy status to penicillin: Secondary | ICD-10-CM | POA: Diagnosis not present

## 2017-09-11 DIAGNOSIS — Z882 Allergy status to sulfonamides status: Secondary | ICD-10-CM | POA: Insufficient documentation

## 2017-09-11 DIAGNOSIS — K317 Polyp of stomach and duodenum: Secondary | ICD-10-CM | POA: Diagnosis not present

## 2017-09-11 DIAGNOSIS — E119 Type 2 diabetes mellitus without complications: Secondary | ICD-10-CM | POA: Diagnosis not present

## 2017-09-11 DIAGNOSIS — Z888 Allergy status to other drugs, medicaments and biological substances status: Secondary | ICD-10-CM | POA: Diagnosis not present

## 2017-09-11 DIAGNOSIS — R12 Heartburn: Secondary | ICD-10-CM | POA: Diagnosis not present

## 2017-09-11 DIAGNOSIS — K228 Other specified diseases of esophagus: Secondary | ICD-10-CM

## 2017-09-11 DIAGNOSIS — Z79899 Other long term (current) drug therapy: Secondary | ICD-10-CM | POA: Diagnosis not present

## 2017-09-11 DIAGNOSIS — Z1211 Encounter for screening for malignant neoplasm of colon: Secondary | ICD-10-CM | POA: Diagnosis not present

## 2017-09-11 DIAGNOSIS — K210 Gastro-esophageal reflux disease with esophagitis: Secondary | ICD-10-CM | POA: Diagnosis not present

## 2017-09-11 DIAGNOSIS — G4730 Sleep apnea, unspecified: Secondary | ICD-10-CM | POA: Insufficient documentation

## 2017-09-11 DIAGNOSIS — K259 Gastric ulcer, unspecified as acute or chronic, without hemorrhage or perforation: Secondary | ICD-10-CM | POA: Diagnosis not present

## 2017-09-11 DIAGNOSIS — R1310 Dysphagia, unspecified: Secondary | ICD-10-CM

## 2017-09-11 DIAGNOSIS — K2289 Other specified disease of esophagus: Secondary | ICD-10-CM

## 2017-09-11 DIAGNOSIS — R130 Aphagia: Secondary | ICD-10-CM | POA: Diagnosis not present

## 2017-09-11 DIAGNOSIS — D123 Benign neoplasm of transverse colon: Secondary | ICD-10-CM | POA: Diagnosis not present

## 2017-09-11 DIAGNOSIS — K2960 Other gastritis without bleeding: Secondary | ICD-10-CM | POA: Diagnosis not present

## 2017-09-11 DIAGNOSIS — Z7951 Long term (current) use of inhaled steroids: Secondary | ICD-10-CM | POA: Diagnosis not present

## 2017-09-11 HISTORY — PX: COLONOSCOPY WITH PROPOFOL: SHX5780

## 2017-09-11 HISTORY — PX: ESOPHAGOGASTRODUODENOSCOPY (EGD) WITH PROPOFOL: SHX5813

## 2017-09-11 LAB — GLUCOSE, CAPILLARY: Glucose-Capillary: 136 mg/dL — ABNORMAL HIGH (ref 65–99)

## 2017-09-11 SURGERY — COLONOSCOPY WITH PROPOFOL
Anesthesia: General

## 2017-09-11 MED ORDER — LIDOCAINE 2% (20 MG/ML) 5 ML SYRINGE
INTRAMUSCULAR | Status: DC | PRN
  Administered 2017-09-11: 25 mg via INTRAVENOUS

## 2017-09-11 MED ORDER — SODIUM CHLORIDE 0.9 % IV SOLN
INTRAVENOUS | Status: DC
  Administered 2017-09-11: 09:00:00 via INTRAVENOUS

## 2017-09-11 MED ORDER — OMEPRAZOLE 20 MG PO CPDR: 20.0000 mg | DELAYED_RELEASE_CAPSULE | Freq: Every day | ORAL | 1 refills | Status: DC

## 2017-09-11 MED ORDER — GLYCOPYRROLATE 0.2 MG/ML IJ SOLN
INTRAMUSCULAR | Status: DC | PRN
  Administered 2017-09-11: 0.2 mg via INTRAVENOUS

## 2017-09-11 MED ORDER — MIDAZOLAM HCL 5 MG/5ML IJ SOLN
INTRAMUSCULAR | Status: DC | PRN
  Administered 2017-09-11: 2 mg via INTRAVENOUS

## 2017-09-11 MED ORDER — PROPOFOL 500 MG/50ML IV EMUL
INTRAVENOUS | Status: DC | PRN
  Administered 2017-09-11: 120 ug/kg/min via INTRAVENOUS

## 2017-09-11 MED ORDER — MIDAZOLAM HCL 2 MG/2ML IJ SOLN
INTRAMUSCULAR | Status: AC
  Filled 2017-09-11: qty 2

## 2017-09-11 MED ORDER — PROPOFOL 10 MG/ML IV BOLUS
INTRAVENOUS | Status: DC | PRN
  Administered 2017-09-11: 30 mg via INTRAVENOUS
  Administered 2017-09-11: 70 mg via INTRAVENOUS

## 2017-09-11 MED ORDER — GLYCOPYRROLATE 0.2 MG/ML IJ SOLN
INTRAMUSCULAR | Status: AC
  Filled 2017-09-11: qty 1

## 2017-09-11 MED ORDER — ESMOLOL HCL 100 MG/10ML IV SOLN
INTRAVENOUS | Status: DC | PRN
  Administered 2017-09-11 (×4): 20 mg via INTRAVENOUS

## 2017-09-11 NOTE — Op Note (Addendum)
[facility] Gastroenterology Patient Name: Alexis Lloyd Procedure Date: 09/11/2017 8:50 AM MRN: [id number] Account #: [id number] Date of Birth: 08-23-1960 Admit Type: Outpatient Age: 57 Room: [facility] ENDO ROOM 2 Gender: Female Note Status: Finalized Procedure:            Colonoscopy Indications:          Screening for colorectal malignant neoplasm Providers:            Casilda Pickerill B. Bonna Gains MD, MD Referring MD:         Mar Daring (Referring MD) Medicines:            Monitored Anesthesia Care Complications:        No immediate complications. Procedure:            Pre-Anesthesia Assessment:                       - ASA Grade Assessment: III - A patient with severe                        systemic disease.                       - Prior to the procedure, a History and Physical was                        performed, and patient medications, allergies and                        sensitivities were reviewed. The patient's tolerance of                        previous anesthesia was reviewed.                       - The risks and benefits of the procedure and the                        sedation options and risks were discussed with the                        patient. All questions were answered and informed                        consent was obtained.                       - Patient identification and proposed procedure were                        verified prior to the procedure by the physician, the                        nurse, the anesthesiologist, the anesthetist and the                        technician. The procedure was verified in the procedure                        room.  After obtaining informed consent, the colonoscope was                        passed under direct vision. Throughout the procedure,                        the patient's blood pressure, pulse, and oxygen                        saturations were monitored continuously. The                        Colonoscope was introduced through the anus and                        advanced to the the cecum, identified by appendiceal                        orifice and ileocecal valve. The colonoscopy was                        performed with ease. The patient tolerated the                        procedure well. The quality of the bowel preparation                        was good. Findings:      The perianal and digital rectal examinations were normal.      Two sessile polyps were found in the transverse colon. The polyps were 2       to 4 mm in size. These polyps were removed with a cold biopsy forceps.       Resection and retrieval were complete.      A single small-mouthed diverticulum was found in the ascending colon.      Multiple diverticula were found in the sigmoid colon.      The exam was otherwise without abnormality.      The rectum, sigmoid colon, descending colon, transverse colon, ascending       colon and cecum appeared normal.      Non-bleeding internal hemorrhoids were found during retroflexion. Impression:           - Two 2 to 4 mm polyps in the transverse colon, removed                        with a cold biopsy forceps. Resected and retrieved.                       - Diverticulosis in the ascending colon.                       - Diverticulosis in the sigmoid colon.                       - The examination was otherwise normal.                       - The rectum, sigmoid colon, descending colon,  transverse colon, ascending colon and cecum are normal.                       - Non-bleeding internal hemorrhoids. Recommendation:       - Discharge patient to home (with escort).                       - Advance diet as tolerated.                       - Continue present medications.                       - Await pathology results.                       - Repeat colonoscopy in 5 years for surveillance.                       - The findings and  recommendations were discussed with                        the patient.                       - The findings and recommendations were discussed with                        the patient's family.                       - Return to primary care physician as previously                        scheduled.                       - High fiber diet.                       - Return to my office in 4 weeks. Procedure Code(s):    --- Professional ---                       [phone], Colonoscopy, flexible; with biopsy, single or                        multiple Diagnosis Code(s):    --- Professional ---                       Z12.11, Encounter for screening for malignant neoplasm                        of colon                       D12.3, Benign neoplasm of transverse colon (hepatic                        flexure or splenic flexure)                       K64.8, Other hemorrhoids  K57.30, Diverticulosis of large intestine without                        perforation or abscess without bleeding CPT copyright 2017 American Medical Association. All rights reserved. The codes documented in this report are preliminary and upon coder review may  be revised to meet current compliance requirements.  Vonda Antigua, MD Margretta Sidle B. Bonna Gains MD, MD 09/11/2017 10:16:18 AM This report has been signed electronically. Number of Addenda: 0 Note Initiated On: 09/11/2017 8:50 AM Scope Withdrawal Time: 0 hours 25 minutes 10 seconds  Total Procedure Duration: 0 hours 33 minutes 51 seconds  Estimated Blood Loss: Estimated blood loss: none. Estimated blood loss: none.      [employer]

## 2017-09-11 NOTE — Transfer of Care (Signed)
Immediate Anesthesia Transfer of Care Note  Patient: Alexis Lloyd  Procedure(s) Performed: COLONOSCOPY WITH PROPOFOL (N/A ) ESOPHAGOGASTRODUODENOSCOPY (EGD) WITH PROPOFOL (N/A )  Patient Location: Endoscopy Unit  Anesthesia Type:General  Level of Consciousness: awake and alert   Airway & Oxygen Therapy: Patient Spontanous Breathing and Patient connected to nasal cannula oxygen  Post-op Assessment: Report given to RN and Post -op Vital signs reviewed and stable  Post vital signs: Reviewed  Last Vitals:  Vitals Value Taken Time  BP 129/84 09/11/2017 10:18 AM  Temp 36.9 C 09/11/2017 10:18 AM  Pulse 108 09/11/2017 10:18 AM  Resp 16 09/11/2017 10:18 AM  SpO2 97 % 09/11/2017 10:18 AM    Last Pain:  Vitals:   09/11/17 0820  TempSrc: Tympanic  PainSc: 0-No pain         Complications: No apparent anesthesia complications

## 2017-09-11 NOTE — Progress Notes (Signed)
Alexis Lloyd [address]  [place], [place] [address]  Main: [phone]  Fax: [phone]   Gastroenterology Consultation  Referring Provider:     Florian Buff* Primary Care Physician:  Mar Daring, PA-C Primary Gastroenterologist:  Dr. Vonda Lloyd Reason for Consultation:     CRC screening, GERD        HPI:   Alexis Lloyd is a 56 y.o. y/o female referred for consultation & management  by Dr. Marlyn Corporal, Clearnce Sorrel, PA-C.  Patient reports midepigastric pain intermittently, 5 or 10, cramping, with no radiation, with no nausea or vomiting, unrelated to meals.  No weight loss.  Also describes a feeling of food stuck in the area intermittently, once a week.  Points to her xiphoid notch for this.  Describes a jumping sensation in this area intermittently as well.  No blood in stool.  No diarrhea.  No constipation.  No family history of colon cancer.  Reports colonoscopy was 10 years ago, unaware of results.  Past Medical History:  Diagnosis Date  . Allergy   . Arthritis   . Breast cancer ([place])   . Breast cancer, left breast ([place]) 10/04/2014  . Diabetes mellitus without complication ([place])   . GERD (gastroesophageal reflux disease)   . Hyperlipidemia   . Hypertension   . Osteoporosis   . Sleep apnea     Past Surgical History:  Procedure Laterality Date  . ABDOMINAL HYSTERECTOMY     due to fibroid tumors 1990 Dr. Quenten Raven  . BREAST BIOPSY Left    negative  . BREAST BIOPSY Left    04/2013 positive  . BREAST CYST ASPIRATION Left   . BREAST EXCISIONAL BIOPSY Left    2015  . BREAST LUMPECTOMY Left 06/17/2013  . BREAST REDUCTION SURGERY Bilateral   . CESAREAN SECTION     x's 2  . CHOLECYSTECTOMY  09/2001  . REDUCTION MAMMAPLASTY    . s/p radiation therapy Left    2015    Prior to Admission medications   Medication Sig Start Date End Date Taking? Authorizing Provider  albuterol (PROVENTIL HFA) 108 (90 Base) MCG/ACT inhaler  Inhale 2 puffs every 6 (six) hours as needed into the lungs. 04/13/17  Yes Mar Daring, PA-C  Blood Glucose Monitoring Suppl (ONE TOUCH ULTRA SYSTEM KIT) w/Device KIT To check blood sugar daily 09/10/16  Yes Mar Daring, PA-C  cetirizine (ZYRTEC) 10 MG tablet Take 1 tablet (10 mg total) by mouth daily. 07/24/15  Yes Margarita Rana, MD  Dulaglutide (TRULICITY) 1.5 OF/7.5ZW SOPN Inject 1.5 mg once a week into the skin. 04/13/17  Yes Fenton Malling M, PA-C  glucose blood (ONE TOUCH ULTRA TEST) test strip To check blood sugar daily 09/10/16  Yes Mar Daring, PA-C  Lancets [facility] ULTRASOFT) lancets To check blood sugar daily 09/10/16  Yes Mar Daring, PA-C  lisinopril-hydrochlorothiazide (PRINZIDE,ZESTORETIC) 20-12.5 MG tablet Patient taking 2 TABLET BY MOUTH ONCE DAILY 05/08/17  Yes Fenton Malling M, PA-C  lovastatin (MEVACOR) 20 MG tablet Take 2 tablets (40 mg total) by mouth daily. Patient taking differently: Take 20 mg by mouth daily.  05/08/17  Yes Mar Daring, PA-C  meloxicam (MOBIC) 15 MG tablet Take 1 tablet (15 mg total) by mouth daily. 11/15/15  Yes Margarita Rana, MD  metFORMIN (GLUCOPHAGE) 1000 MG tablet Take 1 tablet (1,000 mg total) by mouth daily with breakfast. 05/08/17  Yes Burnette, Clearnce Sorrel, PA-C  mometasone-formoterol (DULERA) 100-5 MCG/ACT AERO Inhale  2 puffs into the lungs daily. 11/15/15  Yes Margarita Rana, MD  montelukast (SINGULAIR) 10 MG tablet Take 1 tablet (10 mg total) by mouth daily. 12/29/16  Yes Mar Daring, PA-C  sitaGLIPtin (JANUVIA) 100 MG tablet Take 1 tablet (100 mg total) by mouth daily. 05/08/17  Yes Mar Daring, PA-C  spironolactone (ALDACTONE) 25 MG tablet Take 1 tablet (25 mg total) by mouth daily. 05/08/17  Yes Mar Daring, PA-C  triamcinolone cream (KENALOG) 0.1 % APPLY  CREAM EXTERNALLY TWICE DAILY 04/06/17  Yes Mar Daring, PA-C  verapamil (CALAN) 80 MG tablet Take 1 tablet (80 mg  total) by mouth 2 (two) times daily. Patient taking differently: Take 80 mg by mouth once.  05/08/17  Yes Fenton Malling M, PA-C  omeprazole (PRILOSEC) 20 MG capsule Take 1 capsule (20 mg total) by mouth daily. 09/11/17 10/11/17  Virgel Manifold, MD    Family History  Problem Relation Age of Onset  . Hypertension Mother   . Cancer Mother        breast and rectal cancer  . Breast cancer Mother 35  . Asthma Brother   . Hypertension Brother   . Allergic rhinitis Brother   . Breast cancer Maternal Grandmother      Social History   Tobacco Use  . Smoking status: Former Smoker    Packs/day: 0.25    Years: 21.00    Pack years: 5.25    Last attempt to quit: 06/08/1997    Years since quitting: 20.2  . Smokeless tobacco: Never Used  Substance Use Topics  . Alcohol use: No  . Drug use: No    Allergies as of 08/27/2017 - Review Complete 08/27/2017  Allergen Reaction Noted  . Allegra [fexofenadine] Nausea And Vomiting 09/20/2014  . Avelox [moxifloxacin] Other (See Comments) 09/20/2014  . Bactrim [sulfamethoxazole-trimethoprim] Other (See Comments) 09/20/2014  . Etodolac Nausea Only 11/18/2014  . Fish-derived products Nausea And Vomiting 12/01/2016  . Penicillins Diarrhea and Nausea And Vomiting 09/20/2014  . Sulfa antibiotics Other (See Comments) 09/20/2014  . Macrobid [nitrofurantoin monohyd macro] Other (See Comments) 03/07/2016    Review of Systems:    All systems reviewed and negative except where noted in HPI.   Physical Exam:  BP 135/73   Pulse (!) 102   Temp 98 F (36.7 C) (Oral)   Ht 4' 11.5" (1.511 m)   Wt 220 lb (99.8 kg)   BMI 43.69 kg/m  No LMP recorded. Patient has had a hysterectomy. Psych:  Alert and cooperative. Normal mood and affect. General:   Alert,  Well-developed, well-nourished, pleasant and cooperative in NAD Head:  Normocephalic and atraumatic. Eyes:  Sclera clear, no icterus.   Conjunctiva pink. Ears:  Normal auditory acuity. Nose:  No  deformity, discharge, or lesions. Mouth:  No deformity or lesions,oropharynx pink & moist. Neck:  Supple; no masses or thyromegaly. Lungs:  Respirations even and unlabored.  Clear throughout to auscultation.   No wheezes, crackles, or rhonchi. No acute distress. Heart:  Regular rate and rhythm; no murmurs, clicks, rubs, or gallops. Abdomen:  Normal bowel sounds.  No bruits.  Soft, non-tender and non-distended without masses, hepatosplenomegaly or hernias noted.  No guarding or rebound tenderness.    Msk:  Symmetrical without gross deformities. Good, equal movement & strength bilaterally. Pulses:  Normal pulses noted. Extremities:  No clubbing or edema.  No cyanosis. Neurologic:  Alert and oriented x3;  grossly normal neurologically. Skin:  Intact without significant lesions or rashes. No  jaundice. Lymph Nodes:  No significant cervical adenopathy. Psych:  Alert and cooperative. Normal mood and affect.   Labs: CBC    Component Value Date/Time   WBC 9.4 08/31/2017 1050   WBC 9.2 01/15/2017 1550   RBC 4.02 08/31/2017 1050   RBC 4.40 01/15/2017 1550   HGB 11.7 08/31/2017 1050   HCT 34.8 08/31/2017 1050   PLT 289 08/31/2017 1050   MCV 87 08/31/2017 1050   MCV 90 04/14/2014 0925   MCH 29.1 08/31/2017 1050   MCH 28.9 01/15/2017 1550   MCHC 33.6 08/31/2017 1050   MCHC 33.3 01/15/2017 1550   RDW 14.3 08/31/2017 1050   RDW 13.9 04/14/2014 0925   LYMPHSABS 3.1 08/31/2017 1050   LYMPHSABS 2.6 04/14/2014 0925   MONOABS 0.6 01/15/2017 1550   MONOABS 0.7 04/14/2014 0925   EOSABS 0.1 08/31/2017 1050   EOSABS 0.1 04/14/2014 0925   BASOSABS 0.0 08/31/2017 1050   BASOSABS 0.1 04/14/2014 0925   CMP     Component Value Date/Time   NA 139 08/31/2017 1050   NA 141 04/14/2014 0925   K 3.9 08/31/2017 1050   K 4.0 04/14/2014 0925   CL 98 08/31/2017 1050   CL 103 04/14/2014 0925   CO2 24 08/31/2017 1050   CO2 29 04/14/2014 0925   GLUCOSE 133 (H) 08/31/2017 1050   GLUCOSE 139 (H) 08/28/2016  1050   GLUCOSE 124 (H) 04/14/2014 0925   BUN 10 08/31/2017 1050   BUN 17 04/14/2014 0925   CREATININE 0.71 08/31/2017 1050   CREATININE 0.75 04/14/2014 0925   CALCIUM 9.8 08/31/2017 1050   CALCIUM 9.1 04/14/2014 0925   PROT 7.4 08/31/2017 1050   PROT 7.4 11/02/2013 1007   ALBUMIN 4.1 08/31/2017 1050   ALBUMIN 3.4 11/02/2013 1007   AST 13 08/31/2017 1050   AST 15 11/02/2013 1007   ALT 32 08/31/2017 1050   ALT 50 11/02/2013 1007   ALKPHOS 91 08/31/2017 1050   ALKPHOS 68 11/02/2013 1007   BILITOT 0.5 08/31/2017 1050   BILITOT 0.4 11/02/2013 1007   GFRNONAA 94 08/31/2017 1050   GFRNONAA >60 04/14/2014 0925   GFRNONAA >60 11/02/2013 1007   GFRAA 109 08/31/2017 1050   GFRAA >60 04/14/2014 0925   GFRAA >60 11/02/2013 1007    Imaging Studies: No results found.  Assessment and Plan:   AYSEL GILCHREST is a 57 y.o. y/o female has been referred for colorectal cancer screening, and evaluation for possible GERD  Patient reports midepigastric pain, and also reports intermittent dysphagia, points to her xiphoid mild notch, and states food gets stuck there at times.  No episodes of food impaction. Symptoms may be due to globus sensation from gerd Will evaluate with EGD, will evaluate for ring strictures, narrowing or EOE Patient educated extensively on acid reflux lifestyle modification, including buying a bed wedge, not eating 3 hrs before bedtime, diet modifications, and handout given for the same.   We will also schedule for colonoscopy screening along with EGD    Dr Alexis Lloyd

## 2017-09-11 NOTE — Anesthesia Preprocedure Evaluation (Signed)
Anesthesia Evaluation  Patient identified by MRN, date of birth, ID band Patient awake    Reviewed: Allergy & Precautions, H&P , NPO status , Patient's Chart, lab work & pertinent test results, reviewed documented beta blocker date and time   Airway Mallampati: II   Neck ROM: full    Dental  (+) Poor Dentition, Teeth Intact   Pulmonary neg pulmonary ROS, asthma , sleep apnea and Continuous Positive Airway Pressure Ventilation , former smoker,    Pulmonary exam normal        Cardiovascular hypertension, negative cardio ROS Normal cardiovascular exam Rhythm:regular Rate:Normal     Neuro/Psych  Neuromuscular disease negative neurological ROS  negative psych ROS   GI/Hepatic negative GI ROS, Neg liver ROS, hiatal hernia, GERD  Medicated,  Endo/Other  negative endocrine ROSdiabetes, Well Controlled, Type 2, Oral Hypoglycemic Agents  Renal/GU negative Renal ROS  negative genitourinary   Musculoskeletal   Abdominal   Peds  Hematology negative hematology ROS (+)   Anesthesia Other Findings Past Medical History: No date: Allergy No date: Arthritis No date: Breast cancer ([place]) 10/04/2014: Breast cancer, left breast ([facility]) No date: Diabetes mellitus without complication ([facility]) No date: GERD (gastroesophageal reflux disease) No date: Hyperlipidemia No date: Hypertension No date: Osteoporosis No date: Sleep apnea Past Surgical History: No date: ABDOMINAL HYSTERECTOMY     Comment:  due to fibroid tumors 1990 Dr. Quenten Raven No date: BREAST BIOPSY; Left     Comment:  negative No date: BREAST BIOPSY; Left     Comment:  04/2013 positive No date: BREAST CYST ASPIRATION; Left No date: BREAST EXCISIONAL BIOPSY; Left     Comment:  2015 06/17/2013: BREAST LUMPECTOMY; Left No date: BREAST REDUCTION SURGERY; Bilateral No date: CESAREAN SECTION     Comment:  x's 2 09/2001: CHOLECYSTECTOMY No date: REDUCTION MAMMAPLASTY No date:  s/p radiation therapy; Left     Comment:  2015 BMI    Body Mass Index:  44.23 kg/m     Reproductive/Obstetrics negative OB ROS                             Anesthesia Physical Anesthesia Plan  ASA: III  Anesthesia Plan: General   Post-op Pain Management:    Induction:   PONV Risk Score and Plan:   Airway Management Planned:   Additional Equipment:   Intra-op Plan:   Post-operative Plan:   Informed Consent: I have reviewed the patients History and Physical, chart, labs and discussed the procedure including the risks, benefits and alternatives for the proposed anesthesia with the patient or authorized representative who has indicated his/her understanding and acceptance.   Dental Advisory Given  Plan Discussed with: CRNA  Anesthesia Plan Comments:         Anesthesia Quick Evaluation

## 2017-09-11 NOTE — Anesthesia Post-op Follow-up Note (Signed)
Anesthesia QCDR form completed.        

## 2017-09-11 NOTE — Op Note (Signed)
[facility] Gastroenterology Patient Name: Alexis Lloyd Procedure Date: 09/11/2017 8:50 AM MRN: [id number] Account #: [id number] Date of Birth: Jun 03, 1961 Admit Type: Outpatient Age: 57 Room: [facility] ENDO ROOM 2 Gender: Female Note Status: Finalized Procedure:            Upper GI endoscopy Indications:          Dysphagia, Heartburn Providers:            Tereso Unangst B. Bonna Gains MD, MD Referring MD:         Mar Daring (Referring MD) Medicines:            Monitored Anesthesia Care Complications:        No immediate complications. Procedure:            Pre-Anesthesia Assessment:                       - Prior to the procedure, a History and Physical was                        performed, and patient medications, allergies and                        sensitivities were reviewed. The patient's tolerance of                        previous anesthesia was reviewed.                       - The risks and benefits of the procedure and the                        sedation options and risks were discussed with the                        patient. All questions were answered and informed                        consent was obtained.                       - Patient identification and proposed procedure were                        verified prior to the procedure by the physician, the                        nurse, the anesthesiologist, the anesthetist and the                        technician. The procedure was verified in the procedure                        room.                       - ASA Grade Assessment: III - A patient with severe                        systemic disease.                       After  obtaining informed consent, the endoscope was                        passed under direct vision. Throughout the procedure,                        the patient's blood pressure, pulse, and oxygen                        saturations were monitored continuously. The Endoscope                was introduced through the mouth, and advanced to the                        second part of duodenum. The upper GI endoscopy was                        accomplished with ease. The patient tolerated the                        procedure well. Findings:      Two tongues of salmon-colored mucosa were present from 34 to 35 cm. No       other visible abnormalities were present. The maximum longitudinal       extent of these esophageal mucosal changes was 1 cm in length. Biopsies       were taken with a cold forceps for histology.      The examined esophagus was normal. Biopsies were obtained from the       proximal and distal esophagus with cold forceps for histology of       suspected eosinophilic esophagitis.      A single 5 mm mucosal papule (nodule) with no bleeding and no stigmata       of recent bleeding was found in the gastric antrum. Biopsies were taken       with a cold forceps for histology. It had an en erosion or umbilicated       appearance on top. It could represent a pancreatic rest. It was placed       in Jar 1 and labeled Gastric erosion, cold biopsy, antrum.      A single 3 mm sessile polyp with no bleeding and no stigmata of recent       bleeding was found in the gastric antrum. Biopsies were taken with a       cold forceps for histology.      A single 5 mm sessile polyp with no bleeding and no stigmata of recent       bleeding was found at the incisura. Biopsies were taken with a cold       forceps for histology.      Patchy mildly erythematous mucosa without bleeding was found in the       gastric antrum. Biopsies were taken with a cold forceps for histology.       Biopsies were obtained in the gastric body, at the incisura and in the       gastric antrum with cold forceps for histology.      A single 4 mm sessile polyp with no bleeding and no stigmata of recent       bleeding was found in the cardia. Biopsies were taken with a cold  forceps for  histology.      The duodenal bulb, second portion of the duodenum and examined duodenum       were normal. Impression:           - Salmon-colored mucosa suspicious for short-segment                        Barrett's esophagus. Biopsied.                       - Normal esophagus. Biopsied.                       - A single mucosal papule (nodule) found in the                        stomach. Biopsied.                       - A single gastric polyp. Biopsied.                       - A single gastric polyp. Biopsied.                       - Erythematous mucosa in the antrum. Biopsied.                       - A single gastric polyp. Biopsied.                       - Normal duodenal bulb, second portion of the duodenum                        and examined duodenum.                       - Biopsies were obtained in the gastric body, at the                        incisura and in the gastric antrum. Recommendation:       - Await pathology results.                       - Discharge patient to home (with escort).                       - Advance diet as tolerated.                       - Continue present medications.                       - Patient has a contact number available for                        emergencies. The signs and symptoms of potential                        delayed complications were discussed with the patient.                        Return to normal activities tomorrow. Written discharge  instructions were provided to the patient.                       - Discharge patient to home (with escort).                       - The findings and recommendations were discussed with                        the patient.                       - The findings and recommendations were discussed with                        the patient's family.                       - Follow an antireflux regimen.                       - Take prescribed proton pump inhibitor or H2 blocker                         (antacid) medications 30 - 60 minutes before meals. Procedure Code(s):    --- Professional ---                       [phone], Esophagogastroduodenoscopy, flexible, transoral;                        with biopsy, single or multiple Diagnosis Code(s):    --- Professional ---                       K22.8, Other specified diseases of esophagus                       K31.89, Other diseases of stomach and duodenum                       K31.7, Polyp of stomach and duodenum                       R13.10, Dysphagia, unspecified                       R12, Heartburn CPT copyright 2017 [employer]. All rights reserved. The codes documented in this report are preliminary and upon coder review may  be revised to meet current compliance requirements.  Vonda Antigua, MD Margretta Sidle B. Bonna Gains MD, MD 09/11/2017 9:34:55 AM This report has been signed electronically. Number of Addenda: 0 Note Initiated On: 09/11/2017 8:50 AM Estimated Blood Loss: Estimated blood loss: none.      [facility]

## 2017-09-11 NOTE — H&P (Addendum)
Alexis Antigua, MD [address] [place], [place], [place], [address] [id number], [place], [place], [place], [address] Phone: [phone]  Fax: [phone]  Primary Care Physician:  Mar Daring, PA-C   Pre-Procedure History & Physical: HPI:  Alexis Lloyd is a 57 y.o. female is here for a colonoscopy and EGD.   Past Medical History:  Diagnosis Date  . Allergy   . Arthritis   . Breast cancer ([place])   . Breast cancer, left breast ([place]) 10/04/2014  . Diabetes mellitus without complication ([place])   . GERD (gastroesophageal reflux disease)   . Hyperlipidemia   . Hypertension   . Osteoporosis   . Sleep apnea     Past Surgical History:  Procedure Laterality Date  . ABDOMINAL HYSTERECTOMY     due to fibroid tumors 1990 Dr. Quenten Raven  . BREAST BIOPSY Left    negative  . BREAST BIOPSY Left    04/2013 positive  . BREAST CYST ASPIRATION Left   . BREAST EXCISIONAL BIOPSY Left    2015  . BREAST LUMPECTOMY Left 06/17/2013  . BREAST REDUCTION SURGERY Bilateral   . CESAREAN SECTION     x's 2  . CHOLECYSTECTOMY  09/2001  . REDUCTION MAMMAPLASTY    . s/p radiation therapy Left    2015    Prior to Admission medications   Medication Sig Start Date End Date Taking? Authorizing Provider  albuterol (PROVENTIL HFA) 108 (90 Base) MCG/ACT inhaler Inhale 2 puffs every 6 (six) hours as needed into the lungs. 04/13/17  Yes Mar Daring, PA-C  cetirizine (ZYRTEC) 10 MG tablet Take 1 tablet (10 mg total) by mouth daily. 07/24/15  Yes Margarita Rana, MD  Dulaglutide (TRULICITY) 1.5 ZP/9.1TA SOPN Inject 1.5 mg once a week into the skin. 04/13/17  Yes Mar Daring, PA-C  lisinopril-hydrochlorothiazide (PRINZIDE,ZESTORETIC) 20-12.5 MG tablet Patient taking 2 TABLET BY MOUTH ONCE DAILY 05/08/17  Yes Mar Daring, PA-C  meloxicam (MOBIC) 15 MG tablet Take 1 tablet (15 mg total) by mouth daily. 11/15/15  Yes Margarita Rana, MD  metFORMIN (GLUCOPHAGE) 1000 MG  tablet Take 1 tablet (1,000 mg total) by mouth daily with breakfast. 05/08/17  Yes Burnette, Jennifer M, PA-C  mometasone-formoterol (DULERA) 100-5 MCG/ACT AERO Inhale 2 puffs into the lungs daily. 11/15/15  Yes Margarita Rana, MD  montelukast (SINGULAIR) 10 MG tablet Take 1 tablet (10 mg total) by mouth daily. 12/29/16  Yes Mar Daring, PA-C  sitaGLIPtin (JANUVIA) 100 MG tablet Take 1 tablet (100 mg total) by mouth daily. 05/08/17  Yes Mar Daring, PA-C  spironolactone (ALDACTONE) 25 MG tablet Take 1 tablet (25 mg total) by mouth daily. 05/08/17  Yes Mar Daring, PA-C  verapamil (CALAN) 80 MG tablet Take 1 tablet (80 mg total) by mouth 2 (two) times daily. Patient taking differently: Take 80 mg by mouth once.  05/08/17  Yes Mar Daring, PA-C  Blood Glucose Monitoring Suppl (ONE TOUCH ULTRA SYSTEM KIT) w/Device KIT To check blood sugar daily 09/10/16   Mar Daring, PA-C  glucose blood (ONE TOUCH ULTRA TEST) test strip To check blood sugar daily 09/10/16   Mar Daring, PA-C  Lancets [facility] ULTRASOFT) lancets To check blood sugar daily 09/10/16   Mar Daring, PA-C  lovastatin (MEVACOR) 20 MG tablet Take 2 tablets (40 mg total) by mouth daily. Patient taking differently: Take 20 mg by mouth daily.  05/08/17   Mar Daring, PA-C  triamcinolone cream (KENALOG) 0.1 %  APPLY  CREAM EXTERNALLY TWICE DAILY 04/06/17   Mar Daring, PA-C    Allergies as of 08/28/2017 - Review Complete 08/27/2017  Allergen Reaction Noted  . Allegra [fexofenadine] Nausea And Vomiting 09/20/2014  . Avelox [moxifloxacin] Other (See Comments) 09/20/2014  . Bactrim [sulfamethoxazole-trimethoprim] Other (See Comments) 09/20/2014  . Etodolac Nausea Only 11/18/2014  . Fish-derived products Nausea And Vomiting 12/01/2016  . Penicillins Diarrhea and Nausea And Vomiting 09/20/2014  . Sulfa antibiotics Other (See Comments) 09/20/2014  . Macrobid [nitrofurantoin  monohyd macro] Other (See Comments) 03/07/2016    Family History  Problem Relation Age of Onset  . Hypertension Mother   . Cancer Mother        breast and rectal cancer  . Breast cancer Mother 63  . Asthma Brother   . Hypertension Brother   . Allergic rhinitis Brother   . Breast cancer Maternal Grandmother     Social History   Socioeconomic History  . Marital status: Married    Spouse name: Elberta Fortis  . Number of children: 2  . Years of education: some college  . Highest education level: Not on file  Occupational History    Employer: DISABLED  Social Needs  . Financial resource strain: Not hard at all  . Food insecurity:    Worry: Never true    Inability: Never true  . Transportation needs:    Medical: No    Non-medical: No  Tobacco Use  . Smoking status: Former Smoker    Packs/day: 0.25    Years: 21.00    Pack years: 5.25    Last attempt to quit: 06/08/1997    Years since quitting: 20.2  . Smokeless tobacco: Never Used  Substance and Sexual Activity  . Alcohol use: No  . Drug use: No  . Sexual activity: Yes  Lifestyle  . Physical activity:    Days per week: 1 day    Minutes per session: 60 min  . Stress: Only a little  Relationships  . Social connections:    Talks on phone: More than three times a week    Gets together: More than three times a week    Attends religious service: More than 4 times per year    Active member of club or organization: Yes    Attends meetings of clubs or organizations: More than 4 times per year    Relationship status: Married  . Intimate partner violence:    Fear of current or ex partner: No    Emotionally abused: No    Physically abused: No    Forced sexual activity: No  Other Topics Concern  . Not on file  Social History Narrative  . Not on file    Review of Systems: See HPI, otherwise negative ROS  Physical Exam: BP (!) 180/73   Pulse 85   Temp (!) 97.3 F (36.3 C) (Tympanic)   Resp 15   Wt 219 lb (99.3 kg)    SpO2 99%   BMI 44.23 kg/m  General:   Alert,  pleasant and cooperative in NAD Head:  Normocephalic and atraumatic. Neck:  Supple; no masses or thyromegaly. Lungs:  Clear throughout to auscultation, normal respiratory effort.    Heart:  +S1, +S2, Regular rate and rhythm, No edema. Abdomen:  Soft, nontender and nondistended. Normal bowel sounds, without guarding, and without rebound.   Neurologic:  Alert and  oriented x4;  grossly normal neurologically.  Impression/Plan: Alexis Lloyd is here for a colonoscopy to be performed for  average risk screening and EGD for Acid Reflux and dysphagia.  Risks, benefits, limitations, and alternatives regarding the procedures have been reviewed with the patient.  Questions have been answered.  All parties agreeable.   Virgel Manifold, MD  09/11/2017, 8:58 AM

## 2017-09-11 NOTE — Anesthesia Postprocedure Evaluation (Signed)
Anesthesia Post Note  Patient: Alexis Lloyd  Procedure(s) Performed: COLONOSCOPY WITH PROPOFOL (N/A ) ESOPHAGOGASTRODUODENOSCOPY (EGD) WITH PROPOFOL (N/A )  Patient location during evaluation: PACU Anesthesia Type: General Level of consciousness: awake and alert Pain management: pain level controlled Vital Signs Assessment: post-procedure vital signs reviewed and stable Respiratory status: spontaneous breathing, nonlabored ventilation, respiratory function stable and patient connected to nasal cannula oxygen Cardiovascular status: blood pressure returned to baseline and stable Postop Assessment: no apparent nausea or vomiting Anesthetic complications: no     Last Vitals:  Vitals:   09/11/17 1040 09/11/17 1050  BP: (!) 144/70 135/65  Pulse: 84 84  Resp: (!) 24 (!) 23  Temp:    SpO2: 100% 100%    Last Pain:  Vitals:   09/11/17 1010  TempSrc: Tympanic  PainSc:                  Molli Barrows

## 2017-09-14 ENCOUNTER — Encounter: Payer: Self-pay | Admitting: Gastroenterology

## 2017-09-14 ENCOUNTER — Telehealth: Payer: Self-pay | Admitting: Gastroenterology

## 2017-09-14 NOTE — Telephone Encounter (Signed)
Pt states DR. Tahailiani perscriped her rx Omethrazole 20 mg  Pt states she in formed Dr. Bonna Gains that she cannot take this rx please call in something else and call pt to infirm her

## 2017-09-15 LAB — SURGICAL PATHOLOGY

## 2017-09-17 ENCOUNTER — Telehealth: Payer: Self-pay | Admitting: Gastroenterology

## 2017-09-17 NOTE — Telephone Encounter (Signed)
Pt left vm she states she had a procedure done and Dr. Bonna Gains perscribted her rx Abmisisole and she can not take it and she is having stomach pain pt would like to get a call

## 2017-09-21 NOTE — Telephone Encounter (Signed)
See note of 09/21/17.

## 2017-09-21 NOTE — Telephone Encounter (Signed)
I spoke with pt regarding omeprazole and she states she would take it and would vomit pill right back up. Is there another reflux medication she can take?  She was taking this prior to her visit with you.

## 2017-09-21 NOTE — Telephone Encounter (Signed)
Phone number busy.

## 2017-09-27 ENCOUNTER — Other Ambulatory Visit: Payer: Self-pay | Admitting: Gastroenterology

## 2017-09-27 MED ORDER — BISMUTH SUBSALICYLATE 262 MG PO CHEW
524.0000 mg | CHEWABLE_TABLET | Freq: Four times a day (QID) | ORAL | 0 refills | Status: AC
Start: 2017-09-27 — End: 2017-10-11

## 2017-09-27 MED ORDER — DOXYCYCLINE HYCLATE 100 MG PO CAPS: 100.0000 mg | ORAL_CAPSULE | Freq: Two times a day (BID) | ORAL | 0 refills | Status: AC

## 2017-09-27 MED ORDER — METRONIDAZOLE 500 MG PO TABS: 500.0000 mg | ORAL_TABLET | Freq: Three times a day (TID) | ORAL | 0 refills | Status: AC

## 2017-09-27 MED ORDER — OMEPRAZOLE 20 MG PO CPDR: 20.0000 mg | DELAYED_RELEASE_CAPSULE | Freq: Two times a day (BID) | ORAL | 0 refills | Status: DC

## 2017-09-27 NOTE — Progress Notes (Signed)
Alexis Lloyd,  I have prescribed quadruple therapy regimen to the patient's [place].  Please let her know, that since her chart has a nausea allergy listed to etodolac, the bismuth medication that I prescribed her, gave me an alert, that she may have nausea with this medication as well.  This might not happen, as it is not the same medication as it is not the same medication. I would recommend that she take it and if she has nausea to let us know. The other antibiotic regimens available (the clarithromycin for example) interact with her medications, and the one I prescribed does not. This is thus the safest option but if she has nausea she can stop the medication and call us. Follow up with e in clinic as scheduled and make an appointment if she does not have one. Thank you

## 2017-09-30 ENCOUNTER — Telehealth: Payer: Self-pay | Admitting: Gastroenterology

## 2017-09-30 ENCOUNTER — Other Ambulatory Visit: Payer: Self-pay | Admitting: Physician Assistant

## 2017-09-30 DIAGNOSIS — E1140 Type 2 diabetes mellitus with diabetic neuropathy, unspecified: Secondary | ICD-10-CM

## 2017-09-30 DIAGNOSIS — E1165 Type 2 diabetes mellitus with hyperglycemia: Secondary | ICD-10-CM

## 2017-09-30 DIAGNOSIS — IMO0002 Reserved for concepts with insufficient information to code with codable children: Secondary | ICD-10-CM

## 2017-09-30 NOTE — Telephone Encounter (Signed)
Patient LVM that 2 RX's were called in but she doesn't know what they are for and will not get them filled until you call her. Please call ASAP

## 2017-09-30 NOTE — Telephone Encounter (Signed)
Pt call, line busy. On 09/30/2017. See this note

## 2017-09-30 NOTE — Telephone Encounter (Signed)
From previous message, pt should be taking protonix 20mg .,2 times daily before a meal for 14 days. Phone line busy. Unable to reach pt.

## 2017-10-02 ENCOUNTER — Other Ambulatory Visit: Payer: Self-pay

## 2017-10-02 MED ORDER — PANTOPRAZOLE SODIUM 20 MG PO TBEC: 20.0000 mg | DELAYED_RELEASE_TABLET | Freq: Two times a day (BID) | ORAL | 0 refills | Status: DC

## 2017-10-05 NOTE — Telephone Encounter (Signed)
Pt came by office and all questions answered. Refer to lab result note of 09/27/2017 (surgical pathology).

## 2017-10-09 ENCOUNTER — Other Ambulatory Visit: Payer: Self-pay | Admitting: Physician Assistant

## 2017-10-09 DIAGNOSIS — R69 Illness, unspecified: Secondary | ICD-10-CM | POA: Diagnosis not present

## 2017-10-09 DIAGNOSIS — E1142 Type 2 diabetes mellitus with diabetic polyneuropathy: Secondary | ICD-10-CM

## 2017-10-13 ENCOUNTER — Other Ambulatory Visit: Payer: Self-pay | Admitting: Gastroenterology

## 2017-10-13 ENCOUNTER — Other Ambulatory Visit: Payer: Self-pay | Admitting: Physician Assistant

## 2017-10-13 DIAGNOSIS — E1142 Type 2 diabetes mellitus with diabetic polyneuropathy: Secondary | ICD-10-CM

## 2017-10-13 DIAGNOSIS — H524 Presbyopia: Secondary | ICD-10-CM | POA: Diagnosis not present

## 2017-10-13 DIAGNOSIS — E109 Type 1 diabetes mellitus without complications: Secondary | ICD-10-CM | POA: Diagnosis not present

## 2017-10-13 DIAGNOSIS — E1165 Type 2 diabetes mellitus with hyperglycemia: Secondary | ICD-10-CM | POA: Diagnosis not present

## 2017-10-13 DIAGNOSIS — R69 Illness, unspecified: Secondary | ICD-10-CM | POA: Diagnosis not present

## 2017-10-13 DIAGNOSIS — Z0100 Encounter for examination of eyes and vision without abnormal findings: Secondary | ICD-10-CM | POA: Diagnosis not present

## 2017-10-15 MED ORDER — PANTOPRAZOLE SODIUM 20 MG PO TBEC: 20.0000 mg | DELAYED_RELEASE_TABLET | Freq: Two times a day (BID) | ORAL | 1 refills | Status: DC

## 2017-11-05 ENCOUNTER — Ambulatory Visit: Payer: Medicare HMO | Admitting: Physician Assistant

## 2017-11-05 ENCOUNTER — Encounter: Payer: Self-pay | Admitting: Physician Assistant

## 2017-11-05 VITALS — BP 110/72 | HR 90 | Temp 98.0°F | Resp 16 | Ht 59.0 in | Wt 216.0 lb

## 2017-11-05 DIAGNOSIS — Z8619 Personal history of other infectious and parasitic diseases: Secondary | ICD-10-CM | POA: Insufficient documentation

## 2017-11-05 DIAGNOSIS — L2084 Intrinsic (allergic) eczema: Secondary | ICD-10-CM

## 2017-11-05 MED ORDER — TRIAMCINOLONE ACETONIDE 0.1 % EX CREA: 1.0000 "application " | TOPICAL_CREAM | Freq: Two times a day (BID) | CUTANEOUS | 0 refills | Status: DC

## 2017-11-05 NOTE — Patient Instructions (Signed)
Eczema Eczema is a broad term for a group of skin conditions that cause skin to become rough and inflamed. Each type of eczema has different triggers, symptoms, and treatments. Eczema of any type is usually itchy and symptoms range from mild to severe. Eczema and its symptoms are not spread from person to person (are not contagious). It can appear on different parts of the body at different times. Your eczema may not look the same as someone else's eczema. What are the types of eczema? Atopic dermatitis This is a long-term (chronic) skin disease that keeps coming back (recurring). Usual symptoms are dry skin and small, solid pimples that may swell and leak fluid (weep). Contact dermatitis This happens when something irritates the skin and causes a rash. The irritation can come from substances that you are allergic to (allergens), such as poison ivy, chemicals, or medicines that were applied to your skin. Dyshidrotic eczema This is a form of eczema on the hands and feet. It shows up as very itchy, fluid-filled blisters. It can affect people of any age, but is more common before age 76. Hand eczema This causes very itchy areas of skin on the palms and sides of the hands and fingers. This type of eczema is common in industrial jobs where you may be exposed to many different types of irritants. Lichen simplex chronicus This type of eczema occurs when a person constantly scratches one area of the body. Repeated scratching of the area leads to thickened skin (lichenification). Lichen simplex chronicus can occur along with other types of eczema. It is more common in adults, but may be seen in children as well. Nummular eczema This is a common type of eczema. It has no known cause. It typically causes a red, circular, crusty lesion (plaque) that may be itchy. Scratching may become a habit and can cause bleeding. Nummular eczema occurs most often in people of middle-age or older. It most often affects the  hands. Seborrheic dermatitis This is a common skin disease that mainly affects the scalp. It may also affect any oily areas of the body, such as the face, sides of nose, eyebrows, ears, eyelids, and chest. It is marked by small scaling and redness of the skin (erythema). This can affect people of all ages. In infants, this condition is known as Chartered certified accountant." Stasis dermatitis This is a common skin disease that usually appears on the legs and feet. It most often occurs in people who have a condition that prevents blood from being pumped through the veins in the legs (chronic venous insufficiency). Stasis dermatitis is a chronic condition that needs long-term management. How is eczema diagnosed? Your health care provider will examine your skin and review your medical history. He or she may also give you skin patch tests. These tests involve taking patches that contain possible allergens and placing them on your back. He or she will then check in a few days to see if an allergic reaction occurred. What are the common treatments? Treatment for eczema is based on the type of eczema you have. Hydrocortisone steroid medicine can relieve itching quickly and help reduce inflammation. This medicine may be prescribed or obtained over-the-counter, depending on the strength of the medicine that is needed. Follow these instructions at home:  Take over-the-counter and prescription medicines only as told by your health care provider.  Use creams or ointments to moisturize your skin. Do not use lotions.  Learn what triggers or irritates your symptoms. Avoid these things.  Treat  symptom flare-ups quickly.  Do not itch your skin. This can make your rash worse.  Keep all follow-up visits as told by your health care provider. This is important. Where to find more information:  The American Academy of Dermatology: http://jones-macias.info/  The National Eczema Association: www.nationaleczema.org Contact a health care  provider if:  You have serious itching, even with treatment.  You regularly scratch your skin until it bleeds.  Your rash looks different than usual.  Your skin is painful, swollen, or more red than usual.  You have a fever. Summary  There are eight general types of eczema. Each type has different triggers.  Eczema of any type causes itching that may range from mild to severe.  Treatment varies based on the type of eczema you have. Hydrocortisone steroid medicine can help with itching and inflammation.  Protecting your skin is the best way to prevent eczema. Use moisturizers and lotions. Avoid triggers and irritants, and treat flare-ups quickly. This information is not intended to replace advice given to you by your health care provider. Make sure you discuss any questions you have with your health care provider. Document Released: 10/09/2016 Document Revised: 10/09/2016 Document Reviewed: 10/09/2016 Elsevier Interactive Patient Education  2018 [place].  Triamcinolone skin cream, ointment, lotion, or aerosol What is this medicine? TRIAMCINOLONE (trye am SIN oh lone) is a corticosteroid. It is used on the skin to reduce swelling, redness, itching, and allergic reactions. This medicine may be used for other purposes; ask your health care provider or pharmacist if you have questions. COMMON BRAND NAME(S): Aristocort, Aristocort A, Aristocort HP, Cinalog, Cinolar, DERMASORB TA Complete, Flutex, Kenalog, Pediaderm TA, SP Rx 228, Triacet, Trianex, Triderm What should I tell my health care provider before I take this medicine? They need to know if you have any of these conditions: -diabetes -infection, like tuberculosis, herpes, or fungal infection -large areas of burned or damaged skin -skin wasting or thinning -an unusual or allergic reaction to triamcinolone, corticosteroids, other medicines, foods, dyes, or preservatives -pregnant or trying to get pregnant -breast-feeding How  should I use this medicine? This medicine is for external use only. Do not take by mouth. Follow the directions on the prescription label. Wash your hands before and after use. Apply a thin film of medicine to the affected area. Do not cover with a bandage or dressing unless your doctor or health care professional tells you to. Do not use on healthy skin or over large areas of skin. Do not get this medicine in your eyes. If you do, rinse out with plenty of cool tap water. It is important not to use more medicine than prescribed. Do not use your medicine more often than directed. Talk to your pediatrician regarding the use of this medicine in children. Special care may be needed. Elderly patients are more likely to have damaged skin through aging, and this may increase side effects. This medicine should only be used for brief periods and infrequently in older patients. Overdosage: If you think you have taken too much of this medicine contact a poison control center or emergency room at once. NOTE: This medicine is only for you. Do not share this medicine with others. What if I miss a dose? If you miss a dose, use it as soon as you can. If it is almost time for your next dose, use only that dose. Do not use double or extra doses. What may interact with this medicine? Interactions are not expected. This list may not  describe all possible interactions. Give your health care provider a list of all the medicines, herbs, non-prescription drugs, or dietary supplements you use. Also tell them if you smoke, drink alcohol, or use illegal drugs. Some items may interact with your medicine. What should I watch for while using this medicine? Tell your doctor or health care professional if your symptoms do not start to get better within one week. Do not use for more than 14 days. Do not use on healthy skin or over large areas of skin. Tell your doctor or health care professional if you are exposed to anyone with measles  or chickenpox, or if you develop sores or blisters that do not heal properly. Do not use an airtight bandage to cover the affected area unless your doctor or health care professional tells you to. If you are to cover the area, follow the instructions carefully. Covering the area where the medicine is applied can increase the amount that passes through the skin and increases the risk of side effects. If treating the diaper area of a child, avoid covering the treated area with tight-fitting diapers or plastic pants. This may increase the amount of medicine that passes through the skin and increase the risk of serious side effects. What side effects may I notice from receiving this medicine? Side effects that you should report to your doctor or health care professional as soon as possible: -burning or itching of the skin -dark red spots on the skin -infection -painful, red, pus filled blisters in hair follicles -thinning of the skin, sunburn more likely especially on the face Side effects that usually do not require medical attention (report to your doctor or health care professional if they continue or are bothersome): -dry skin, irritation -unusual increased growth of hair on the face or body This list may not describe all possible side effects. Call your doctor for medical advice about side effects. You may report side effects to FDA at 1-800-FDA-1088. Where should I keep my medicine? Keep out of the reach of children. Store at room temperature between 15 and 30 degrees C (59 and 86 degrees F). Do not freeze. Throw away any unused medicine after the expiration date. NOTE: This sheet is a summary. It may not cover all possible information. If you have questions about this medicine, talk to your doctor, pharmacist, or health care provider.  2018 Elsevier/Gold Standard (2013-09-15 15:59:51)

## 2017-11-05 NOTE — Progress Notes (Signed)
Patient: Alexis Lloyd Female    DOB: 07-Dec-1960   57 y.o.   MRN: [id number] Visit Date: 11/05/2017  Today's Provider: Mar Daring, PA-C   Chief Complaint  Patient presents with  . Rash   Subjective:    HPI Patient here today C/O rash on left arm x's one month. Patient reports rash is spreading and is itching. Patient reports she has not tried any medications on the rash.     Allergies  Allergen Reactions  . Allegra [Fexofenadine] Nausea And Vomiting  . Avelox [Moxifloxacin] Other (See Comments)    Hallucinations  . Bactrim [Sulfamethoxazole-Trimethoprim] Other (See Comments)    unknown  . Etodolac Nausea Only  . Fish-Derived Products Nausea And Vomiting    With "seafood"  . Penicillins Diarrhea and Nausea And Vomiting  . Sulfa Antibiotics Other (See Comments)    unknown  . Macrobid [employer] Macro] Other (See Comments)    Constipation and globus hystericus.     Current Outpatient Medications:  .  albuterol (PROVENTIL HFA) 108 (90 Base) MCG/ACT inhaler, Inhale 2 puffs every 6 (six) hours as needed into the lungs., Disp: 3 Inhaler, Rfl: 1 .  Blood Glucose Monitoring Suppl (ONE TOUCH ULTRA SYSTEM KIT) w/Device KIT, To check blood sugar daily, Disp: 1 each, Rfl: 0 .  cetirizine (ZYRTEC) 10 MG tablet, Take 1 tablet (10 mg total) by mouth daily., Disp: 90 tablet, Rfl: 1 .  glucose blood (ONE TOUCH ULTRA TEST) test strip, USE TEST STRIP(S) TO CHECK BLLOD SUGAR DAILY, Disp: 100 each, Rfl: 12 .  Lancets (ONETOUCH ULTRASOFT) lancets, USE LANCET(S)  TO CHECK GLUCOSE DAILY, Disp: 100 each, Rfl: 12 .  lisinopril-hydrochlorothiazide (PRINZIDE,ZESTORETIC) 20-12.5 MG tablet, Patient taking 2 TABLET BY MOUTH ONCE DAILY, Disp: 180 tablet, Rfl: 1 .  lovastatin (MEVACOR) 20 MG tablet, Take 2 tablets (40 mg total) by mouth daily. (Patient taking differently: Take 20 mg by mouth daily. ), Disp: 180 tablet, Rfl: 1 .  meloxicam (MOBIC) 15 MG tablet, Take 1 tablet  (15 mg total) by mouth daily., Disp: 90 tablet, Rfl: 1 .  metFORMIN (GLUCOPHAGE) 1000 MG tablet, Take 1 tablet (1,000 mg total) by mouth daily with breakfast., Disp: 90 tablet, Rfl: 1 .  mometasone-formoterol (DULERA) 100-5 MCG/ACT AERO, Inhale 2 puffs into the lungs daily., Disp: 39 g, Rfl: 1 .  montelukast (SINGULAIR) 10 MG tablet, Take 1 tablet (10 mg total) by mouth daily., Disp: 90 tablet, Rfl: 3 .  sitaGLIPtin (JANUVIA) 100 MG tablet, Take 1 tablet (100 mg total) by mouth daily., Disp: 90 tablet, Rfl: 1 .  spironolactone (ALDACTONE) 25 MG tablet, Take 1 tablet (25 mg total) by mouth daily., Disp: 90 tablet, Rfl: 1 .  TRULICITY 1.5 AC/1.6SA SOPN, INJECT 1.5MG SUBCUTANEOUSLY ONCE A WEEK, Disp: 12 pen, Rfl: 3 .  verapamil (CALAN) 80 MG tablet, Take 1 tablet (80 mg total) by mouth 2 (two) times daily. (Patient taking differently: Take 80 mg by mouth once. ), Disp: 180 tablet, Rfl: 1  Review of Systems  Constitutional: Negative.   Respiratory: Negative.   Cardiovascular: Negative.   Gastrointestinal: Negative.   Skin: Positive for rash.  Neurological: Negative.     Social History   Tobacco Use  . Smoking status: Former Smoker    Packs/day: 0.25    Years: 21.00    Pack years: 5.25    Last attempt to quit: 06/08/1997    Years since quitting: 20.4  . Smokeless tobacco: Never Used  Substance Use Topics  . Alcohol use: No   Objective:   BP 110/72 (BP Location: Left Arm, Patient Position: Sitting, Cuff Size: Large)   Pulse 90   Temp 98 F (36.7 C) (Oral)   Resp 16   Ht _0  (1.499 m)   Wt 216 lb (98 kg)   SpO2 98%   BMI 43.63 kg/m  Vitals:   11/05/17 1057  BP: 110/72  Pulse: 90  Resp: 16  Temp: 98 F (36.7 C)  TempSrc: Oral  SpO2: 98%  Weight: 216 lb (98 kg)  Height: _1  (1.499 m)     Physical Exam  Constitutional: She appears well-developed and well-nourished. No distress.  Neck: Normal range of motion. Neck supple.  Cardiovascular: Normal rate, regular  rhythm and normal heart sounds. Exam reveals no gallop and no friction rub.  No murmur heard. Pulmonary/Chest: Effort normal and breath sounds normal. No respiratory distress. She has no wheezes. She has no rales.  Skin: She is not diaphoretic.     Vitals reviewed.      Assessment & Plan:     1. Intrinsic eczema Suspect eczematous rash. Triamcinolone cream for the rash as below. Call if symptoms worsen.  - triamcinolone cream (KENALOG) 0.1 %; Apply 1 application topically 2 (two) times daily.  Dispense: 80 g; Refill: 0  2. History of Helicobacter pylori infection Diagnosed in April and treated. Patient reports not needing to take omeprazole any longer.        Mar Daring, PA-C  [facility]

## 2017-11-30 ENCOUNTER — Ambulatory Visit: Payer: Medicare HMO | Admitting: Gastroenterology

## 2017-12-01 ENCOUNTER — Ambulatory Visit: Payer: Medicare HMO | Admitting: Gastroenterology

## 2017-12-01 ENCOUNTER — Encounter: Payer: Self-pay | Admitting: Gastroenterology

## 2017-12-01 VITALS — BP 114/71 | HR 94 | Ht 59.0 in | Wt 217.0 lb

## 2017-12-01 DIAGNOSIS — K2970 Gastritis, unspecified, without bleeding: Secondary | ICD-10-CM | POA: Diagnosis not present

## 2017-12-01 DIAGNOSIS — K219 Gastro-esophageal reflux disease without esophagitis: Secondary | ICD-10-CM | POA: Diagnosis not present

## 2017-12-01 DIAGNOSIS — K2990 Gastroduodenitis, unspecified, without bleeding: Secondary | ICD-10-CM

## 2017-12-01 NOTE — Progress Notes (Signed)
Vonda Antigua, MD [address]  [place]  [place], [place] [address]  Main: [phone]  Fax: [phone]   Primary Care Physician: Mar Daring, PA-C  Primary Gastroenterologist:  Dr. Vonda Antigua  Chief complaint: Epigastric pain    HPI: Alexis Lloyd is a 57 y.o. female Patient initially seen in consultation in March 2019 for midepigastric pain.  This is completely resolved at this time.  Patient denies any abdominal pain, heartburn, dysphagia.  No weight loss, no altered bowel habits.  She underwent EGD and colonoscopy in April 2019.  Biopsies showed H. pylori, and she is status post quadruple therapy.  Triple therapy medications interacted with her regular meds, so quadruple therapy was prescribed instead.  EGD findings:      Two tongues of salmon-colored mucosa were present from 34 to 35 cm...       The examined esophagus was normal. Biopsies were obtained from the       proximal and distal esophagus with cold forceps for histology of       suspected eosinophilic esophagitis.       A single 5 mm mucosal papule (nodule) with no bleeding and no stigmata       of recent bleeding was found in the gastric antrum. Biopsies were taken       with a cold forceps for histology. It had an en erosion or umbilicated       appearance on top. It could represent a pancreatic rest.       It was placed in Jar 1 and labeled Gastric erosion, cold biopsy, antrum.       A single 3 mm sessile polyp with no bleeding and no stigmata of recent       bleeding was found in the gastric antrum. Biopsies were taken with a       cold forceps for histology.       A single 5 mm sessile polyp with no bleeding and no stigmata of recent       bleeding was found at the incisura. Biopsies were taken with a cold       forceps for histology.       Patchy mildly erythematous mucosa without bleeding was found in the       gastric antrum. Biopsies were taken with a cold forceps for  histology.       Biopsies were obtained in the gastric body, at the incisura and in the       gastric antrum with cold forceps for histology.       A single 4 mm sessile polyp with no bleeding and no stigmata of recent       bleeding was found in the cardia. Biopsies were taken with a cold       forceps for histology.       The duodenal bulb, second portion of the duodenum and examined duodenum       were normal.  Colonoscopy impression:           - Two 2 to 4 mm polyps in the transverse colon, removed                        with a cold biopsy forceps. Resected and retrieved.                       - Diverticulosis in the ascending colon.                       -  Diverticulosis in the sigmoid colon.                       - The examination was otherwise normal.   DIAGNOSIS:  A. STOMACH EROSION, ANTRUM; COLD BIOPSY:  - ANTRAL MUCOSA WITH MODERATE CHRONIC GASTRITIS, HELICO[employer]ER PYLORI  ASSOCIATED.  - NEGATIVE FOR DYSPLASIA AND MALIGNANCY.   B. STOMACH POLYP, ANTRUM; COLD BIOPSY:  - POLYPOID FOVEOLAR HYPERPLASIA AND MODERATE CHRONIC ACTIVE GASTRITIS,  HELICOBACTER PYLORI ASSOCIATED.  - NEGATIVE FOR DYSPLASIA AND MALIGNANCY.   C. STOMACH LESION, INCISURA; COLD BIOPSY:  - MODERATE CHRONIC GASTRITIS, HELICOBACTER PYLORI ASSOCIATED.  - NEGATIVE FOR DYSPLASIA AND MALIGNANCY.   D. STOMACH, ERYTHEMA; COLD BIOPSY:  - MODERATE CHRONIC GASTRITIS, HELICOBACTER PYLORI ASSOCIATED.  - NEGATIVE FOR DYSPLASIA AND MALIGNANCY.   E. STOMACH POLYP, CARDIA; COLD BIOPSY:  - POLYPOID FOVEOLAR HYPERPLASIA AND MODERATE CHRONIC ACTIVE GASTRITIS,  HELICOBACTER PYLORI ASSOCIATED.  - NEGATIVE FOR DYSPLASIA AND MALIGNANCY.   F. GE JUNCTION, SALMON COLORED MUCOSA; COLD BIOPSY:  - REFLUX GASTROESOPHAGITIS.  - NEGATIVE FOR GOBLET CELLS, DYSPLASIA AND MALIGNANCY.  - MULTIPLE DEEPER LEVELS WERE EXAMINED.   H. COLON POLYP, TRANSVERSE; COLD BIOPSY:  - TUBULAR ADENOMA (1), NEGATIVE FOR HIGH-GRADE DYSPLASIA  AND MALIGNANCY.  - PROMINENT LYMPHOID AGGREGATE (1).  - MULTIPLE DEEPER LEVELS WERE EXAMINED.    Current Outpatient Medications  Medication Sig Dispense Refill  . albuterol (PROVENTIL HFA) 108 (90 Base) MCG/ACT inhaler Inhale 2 puffs every 6 (six) hours as needed into the lungs. 3 Inhaler 1  . Blood Glucose Monitoring Suppl (ONE TOUCH ULTRA SYSTEM KIT) w/Device KIT To check blood sugar daily 1 each 0  . cetirizine (ZYRTEC) 10 MG tablet Take 1 tablet (10 mg total) by mouth daily. 90 tablet 1  . glucose blood (ONE TOUCH ULTRA TEST) test strip USE TEST STRIP(S) TO CHECK BLLOD SUGAR DAILY 100 each 12  . Lancets (ONETOUCH ULTRASOFT) lancets USE LANCET(S)  TO CHECK GLUCOSE DAILY 100 each 12  . lisinopril-hydrochlorothiazide (PRINZIDE,ZESTORETIC) 20-12.5 MG tablet Patient taking 2 TABLET BY MOUTH ONCE DAILY 180 tablet 1  . meloxicam (MOBIC) 15 MG tablet Take 1 tablet (15 mg total) by mouth daily. 90 tablet 1  . metFORMIN (GLUCOPHAGE) 1000 MG tablet Take 1 tablet (1,000 mg total) by mouth daily with breakfast. 90 tablet 1  . mometasone-formoterol (DULERA) 100-5 MCG/ACT AERO Inhale 2 puffs into the lungs daily. 39 g 1  . montelukast (SINGULAIR) 10 MG tablet Take 1 tablet (10 mg total) by mouth daily. 90 tablet 3  . sitaGLIPtin (JANUVIA) 100 MG tablet Take 1 tablet (100 mg total) by mouth daily. 90 tablet 1  . spironolactone (ALDACTONE) 25 MG tablet Take 1 tablet (25 mg total) by mouth daily. 90 tablet 1  . triamcinolone cream (KENALOG) 0.1 % Apply 1 application topically 2 (two) times daily. 80 g 0  . verapamil (CALAN) 80 MG tablet Take 1 tablet (80 mg total) by mouth 2 (two) times daily. (Patient taking differently: Take 80 mg by mouth once. ) 180 tablet 1  . lovastatin (MEVACOR) 20 MG tablet Take 2 tablets (40 mg total) by mouth daily. (Patient not taking: Reported on 12/01/2017) 180 tablet 1  . TRULICITY 1.5 WE/9.9BZ SOPN INJECT 1.'5MG'$  SUBCUTANEOUSLY ONCE A WEEK (Patient not taking: Reported on  12/01/2017) 12 pen 3   No current facility-administered medications for this visit.     Allergies as of 12/01/2017 - Review Complete 12/01/2017  Allergen Reaction Noted  . Allegra [fexofenadine] Nausea  And Vomiting 09/20/2014  . Avelox [moxifloxacin] Other (See Comments) 09/20/2014  . Bactrim [sulfamethoxazole-trimethoprim] Other (See Comments) 09/20/2014  . Etodolac Nausea Only 11/18/2014  . Fish-derived products Nausea And Vomiting 12/01/2016  . Penicillins Diarrhea and Nausea And Vomiting 09/20/2014  . Sulfa antibiotics Other (See Comments) 09/20/2014  . Macrobid [nitrofurantoin monohyd macro] Other (See Comments) 03/07/2016    ROS:  General: Negative for anorexia, weight loss, fever, chills, fatigue, weakness. ENT: Negative for hoarseness, difficulty swallowing , nasal congestion. CV: Negative for chest pain, angina, palpitations, dyspnea on exertion, peripheral edema.  Respiratory: Negative for dyspnea at rest, dyspnea on exertion, cough, sputum, wheezing.  GI: See history of present illness. GU:  Negative for dysuria, hematuria, urinary incontinence, urinary frequency, nocturnal urination.  Endo: Negative for unusual weight change.    Physical Examination:   BP 114/71   Pulse 94   Ht 4' 11"  (1.499 m)   Wt 217 lb (98.4 kg)   BMI 43.83 kg/m   General: Well-nourished, well-developed in no acute distress.  Eyes: No icterus. Conjunctivae pink. Mouth: Oropharyngeal mucosa moist and pink , no lesions erythema or exudate. Neck: Supple, Trachea midline Abdomen: Bowel sounds are normal, nontender, nondistended, no hepatosplenomegaly or masses, no abdominal bruits or hernia , no rebound or guarding.   Extremities: No lower extremity edema. No clubbing or deformities. Neuro: Alert and oriented x 3.  Grossly intact. Skin: Warm and dry, no jaundice.   Psych: Alert and cooperative, normal mood and affect.   Labs: CMP     Component Value Date/Time   NA 139 08/31/2017 1050    NA 141 04/14/2014 0925   K 3.9 08/31/2017 1050   K 4.0 04/14/2014 0925   CL 98 08/31/2017 1050   CL 103 04/14/2014 0925   CO2 24 08/31/2017 1050   CO2 29 04/14/2014 0925   GLUCOSE 133 (H) 08/31/2017 1050   GLUCOSE 139 (H) 08/28/2016 1050   GLUCOSE 124 (H) 04/14/2014 0925   BUN 10 08/31/2017 1050   BUN 17 04/14/2014 0925   CREATININE 0.71 08/31/2017 1050   CREATININE 0.75 04/14/2014 0925   CALCIUM 9.8 08/31/2017 1050   CALCIUM 9.1 04/14/2014 0925   PROT 7.4 08/31/2017 1050   PROT 7.4 11/02/2013 1007   ALBUMIN 4.1 08/31/2017 1050   ALBUMIN 3.4 11/02/2013 1007   AST 13 08/31/2017 1050   AST 15 11/02/2013 1007   ALT 32 08/31/2017 1050   ALT 50 11/02/2013 1007   ALKPHOS 91 08/31/2017 1050   ALKPHOS 68 11/02/2013 1007   BILITOT 0.5 08/31/2017 1050   BILITOT 0.4 11/02/2013 1007   GFRNONAA 94 08/31/2017 1050   GFRNONAA >60 04/14/2014 0925   GFRNONAA >60 11/02/2013 1007   GFRAA 109 08/31/2017 1050   GFRAA >60 04/14/2014 0925   GFRAA >60 11/02/2013 1007   Lab Results  Component Value Date   WBC 9.4 08/31/2017   HGB 11.7 08/31/2017   HCT 34.8 08/31/2017   MCV 87 08/31/2017   PLT 289 08/31/2017    Imaging Studies: No results found.  Assessment and Plan:   Alexis Lloyd is a 58 y.o. y/o female here for follow-up of midepigastric abdominal pain which is completely resolved after being diagnosed with H. pylori via biopsies, and patient is status post quadruple therapy  Will check for eradication with stool testing Patient is not on any PPI, and has not been on any over the last 2 weeks If symptoms recur, patient is to contact us  GE junction, salmon-colored mucosa  biopsy showed reflux gastroesophagitis and no metaplasia or Goblet cells were reported Repeat EGD can be done in 2-3 years for surveillance of this area  Colonoscopy up-to-date, with surveillance indicated in 5 years from April 2019  Dr Vonda Antigua

## 2017-12-01 NOTE — Patient Instructions (Signed)
F/u 6 months

## 2017-12-03 ENCOUNTER — Ambulatory Visit: Payer: Self-pay | Admitting: Physician Assistant

## 2017-12-03 NOTE — Progress Notes (Deleted)
Patient: Alexis Lloyd Female    DOB: Jul 19, 1960   58 y.o.   MRN: [id number] Visit Date: 12/03/2017  Today's Provider: Mar Daring, PA-C   No chief complaint on file.  Subjective:    HPI  Diabetes Mellitus Type II, Follow-up:   Lab Results  Component Value Date   HGBA1C 9.0 (H) 08/31/2017   HGBA1C 8.3 05/08/2017   HGBA1C 8.2 03/04/2017    Last seen for diabetes {1-12:18279} {days/wks/mos/yrs:310907} ago.  Management since then includes ***. She reports {excellent/good/fair/poor:19665} compliance with treatment. She {ACTION; IS/IS BIP:77939688} having side effects. *** Current symptoms include {Symptoms; diabetes:14075} and have been {Desc; course:15616}. Home blood sugar records: {diabetes glucometry results:16657}  Episodes of hypoglycemia? {yes***/no:17258}   Current Insulin Regimen: *** Most Recent Eye Exam: *** Weight trend: {trend:16658} Prior visit with dietician: {yes/no:17258} Current diet: {diet habits:16563} Current exercise: {exercise types:16438}  Pertinent Labs:    Component Value Date/Time   CHOL 164 08/31/2017 1050   TRIG 117 08/31/2017 1050   HDL 28 (L) 08/31/2017 1050   LDLCALC 113 (H) 08/31/2017 1050   CREATININE 0.71 08/31/2017 1050   CREATININE 0.75 04/14/2014 0925    Wt Readings from Last 3 Encounters:  12/01/17 217 lb (98.4 kg)  11/05/17 216 lb (98 kg)  09/11/17 219 lb (99.3 kg)    ------------------------------------------------------------------------      Allergies  Allergen Reactions  . Allegra [Fexofenadine] Nausea And Vomiting  . Avelox [Moxifloxacin] Other (See Comments)    Hallucinations  . Bactrim [Sulfamethoxazole-Trimethoprim] Other (See Comments)    unknown  . Etodolac Nausea Only  . Fish-Derived Products Nausea And Vomiting    With "seafood"  . Penicillins Diarrhea and Nausea And Vomiting  . Sulfa Antibiotics Other (See Comments)    unknown  . Macrobid [employer] Macro] Other (See  Comments)    Constipation and globus hystericus.     Current Outpatient Medications:  .  albuterol (PROVENTIL HFA) 108 (90 Base) MCG/ACT inhaler, Inhale 2 puffs every 6 (six) hours as needed into the lungs., Disp: 3 Inhaler, Rfl: 1 .  Blood Glucose Monitoring Suppl (ONE TOUCH ULTRA SYSTEM KIT) w/Device KIT, To check blood sugar daily, Disp: 1 each, Rfl: 0 .  cetirizine (ZYRTEC) 10 MG tablet, Take 1 tablet (10 mg total) by mouth daily., Disp: 90 tablet, Rfl: 1 .  glucose blood (ONE TOUCH ULTRA TEST) test strip, USE TEST STRIP(S) TO CHECK BLLOD SUGAR DAILY, Disp: 100 each, Rfl: 12 .  Lancets (ONETOUCH ULTRASOFT) lancets, USE LANCET(S)  TO CHECK GLUCOSE DAILY, Disp: 100 each, Rfl: 12 .  lisinopril-hydrochlorothiazide (PRINZIDE,ZESTORETIC) 20-12.5 MG tablet, Patient taking 2 TABLET BY MOUTH ONCE DAILY, Disp: 180 tablet, Rfl: 1 .  lovastatin (MEVACOR) 20 MG tablet, Take 2 tablets (40 mg total) by mouth daily. (Patient not taking: Reported on 12/01/2017), Disp: 180 tablet, Rfl: 1 .  meloxicam (MOBIC) 15 MG tablet, Take 1 tablet (15 mg total) by mouth daily., Disp: 90 tablet, Rfl: 1 .  metFORMIN (GLUCOPHAGE) 1000 MG tablet, Take 1 tablet (1,000 mg total) by mouth daily with breakfast., Disp: 90 tablet, Rfl: 1 .  mometasone-formoterol (DULERA) 100-5 MCG/ACT AERO, Inhale 2 puffs into the lungs daily., Disp: 39 g, Rfl: 1 .  montelukast (SINGULAIR) 10 MG tablet, Take 1 tablet (10 mg total) by mouth daily., Disp: 90 tablet, Rfl: 3 .  sitaGLIPtin (JANUVIA) 100 MG tablet, Take 1 tablet (100 mg total) by mouth daily., Disp: 90 tablet, Rfl: 1 .  spironolactone (ALDACTONE)  25 MG tablet, Take 1 tablet (25 mg total) by mouth daily., Disp: 90 tablet, Rfl: 1 .  triamcinolone cream (KENALOG) 0.1 %, Apply 1 application topically 2 (two) times daily., Disp: 80 g, Rfl: 0 .  TRULICITY 1.5 TN/5.3XY SOPN, INJECT 1.5MG SUBCUTANEOUSLY ONCE A WEEK (Patient not taking: Reported on 12/01/2017), Disp: 12 pen, Rfl: 3 .  verapamil  (CALAN) 80 MG tablet, Take 1 tablet (80 mg total) by mouth 2 (two) times daily. (Patient taking differently: Take 80 mg by mouth once. ), Disp: 180 tablet, Rfl: 1  Review of Systems  Social History   Tobacco Use  . Smoking status: Former Smoker    Packs/day: 0.25    Years: 21.00    Pack years: 5.25    Last attempt to quit: 06/08/1997    Years since quitting: 20.5  . Smokeless tobacco: Never Used  Substance Use Topics  . Alcohol use: No   Objective:   There were no vitals taken for this visit. There were no vitals filed for this visit.   Physical Exam      Assessment & Plan:           Mar Daring, PA-C  [facility]

## 2017-12-08 ENCOUNTER — Telehealth: Payer: Self-pay | Admitting: Physician Assistant

## 2017-12-08 NOTE — Telephone Encounter (Signed)
I called pt to schedule AWV with [name].  She said that she'll have to call back and schedule it because she's had death in the family. VDM (DD)

## 2017-12-21 DIAGNOSIS — Z0100 Encounter for examination of eyes and vision without abnormal findings: Secondary | ICD-10-CM | POA: Diagnosis not present

## 2017-12-21 DIAGNOSIS — K2970 Gastritis, unspecified, without bleeding: Secondary | ICD-10-CM | POA: Diagnosis not present

## 2017-12-21 DIAGNOSIS — R2681 Unsteadiness on feet: Secondary | ICD-10-CM | POA: Diagnosis not present

## 2017-12-21 DIAGNOSIS — I872 Venous insufficiency (chronic) (peripheral): Secondary | ICD-10-CM | POA: Diagnosis not present

## 2017-12-21 DIAGNOSIS — K2990 Gastroduodenitis, unspecified, without bleeding: Secondary | ICD-10-CM | POA: Diagnosis not present

## 2017-12-23 LAB — H. PYLORI ANTIGEN, STOOL: H pylori Ag, Stl: NEGATIVE

## 2017-12-28 ENCOUNTER — Telehealth: Payer: Self-pay | Admitting: Gastroenterology

## 2017-12-28 ENCOUNTER — Telehealth: Payer: Self-pay

## 2017-12-28 DIAGNOSIS — L239 Allergic contact dermatitis, unspecified cause: Secondary | ICD-10-CM

## 2017-12-28 NOTE — Telephone Encounter (Signed)
Referral placed.

## 2017-12-28 NOTE — Telephone Encounter (Signed)
Please Review.  Thanks,  -Ardyce Heyer 

## 2017-12-28 NOTE — Telephone Encounter (Signed)
Patient returning call for lab results from 7.15.19.

## 2017-12-28 NOTE — Telephone Encounter (Signed)
Patient is requesting a referral to a Allergist. She states she is being treated for eczema, but the treatment is providing no relief. She thinks she is allergic to one of her medications. CB # [id number].

## 2018-01-05 ENCOUNTER — Telehealth: Payer: Self-pay | Admitting: Physician Assistant

## 2018-01-05 DIAGNOSIS — D0512 Intraductal carcinoma in situ of left breast: Secondary | ICD-10-CM | POA: Diagnosis not present

## 2018-01-05 DIAGNOSIS — B373 Candidiasis of vulva and vagina: Secondary | ICD-10-CM

## 2018-01-05 DIAGNOSIS — D508 Other iron deficiency anemias: Secondary | ICD-10-CM | POA: Diagnosis not present

## 2018-01-05 DIAGNOSIS — E11628 Type 2 diabetes mellitus with other skin complications: Secondary | ICD-10-CM

## 2018-01-05 DIAGNOSIS — L409 Psoriasis, unspecified: Secondary | ICD-10-CM

## 2018-01-05 DIAGNOSIS — B3731 Acute candidiasis of vulva and vagina: Secondary | ICD-10-CM

## 2018-01-05 NOTE — Telephone Encounter (Signed)
Patient states that she needs a referral to go to a dermatologist for her feet.  She states that she has black spots on her feet.  She would also like a referral to gynecology.  She states that she keeps getting a vaginal infection that keeps coming back.  She states that she has discussed both of these issues with you before.

## 2018-01-06 NOTE — Telephone Encounter (Signed)
Please Review.  Thanks,  -Joseline 

## 2018-01-06 NOTE — Telephone Encounter (Signed)
Referrals placed 

## 2018-01-11 ENCOUNTER — Telehealth: Payer: Self-pay

## 2018-01-11 NOTE — Telephone Encounter (Signed)
Pt given contact information for [facility].Referral has been sent to their office

## 2018-01-11 NOTE — Telephone Encounter (Signed)
Patient is calling requesting the time and date of her GYN appt at [facility]. Please advise. Thanks!

## 2018-01-14 DIAGNOSIS — M65342 Trigger finger, left ring finger: Secondary | ICD-10-CM | POA: Diagnosis not present

## 2018-01-14 DIAGNOSIS — M65322 Trigger finger, left index finger: Secondary | ICD-10-CM | POA: Diagnosis not present

## 2018-01-15 ENCOUNTER — Ambulatory Visit: Payer: Medicare HMO | Admitting: Physician Assistant

## 2018-01-15 ENCOUNTER — Ambulatory Visit (INDEPENDENT_AMBULATORY_CARE_PROVIDER_SITE_OTHER): Payer: Medicare HMO | Admitting: Physician Assistant

## 2018-01-15 ENCOUNTER — Encounter: Payer: Self-pay | Admitting: Physician Assistant

## 2018-01-15 VITALS — BP 128/70 | HR 60 | Temp 98.1°F | Resp 16 | Wt 214.0 lb

## 2018-01-15 DIAGNOSIS — N3000 Acute cystitis without hematuria: Secondary | ICD-10-CM

## 2018-01-15 DIAGNOSIS — R350 Frequency of micturition: Secondary | ICD-10-CM

## 2018-01-15 DIAGNOSIS — R3915 Urgency of urination: Secondary | ICD-10-CM

## 2018-01-15 LAB — POCT URINALYSIS DIPSTICK
Appearance: ABNORMAL
Bilirubin, UA: NEGATIVE
Glucose, UA: POSITIVE — AB
Ketones, UA: NEGATIVE
Nitrite, UA: POSITIVE
Protein, UA: POSITIVE — AB
Spec Grav, UA: 1.02 (ref 1.010–1.025)
Urobilinogen, UA: 0.2 E.U./dL
pH, UA: 6 (ref 5.0–8.0)

## 2018-01-15 MED ORDER — CEPHALEXIN 500 MG PO CAPS: 500.0000 mg | ORAL_CAPSULE | Freq: Three times a day (TID) | ORAL | 0 refills | Status: DC

## 2018-01-15 NOTE — Progress Notes (Signed)
Patient: Alexis Lloyd Female    DOB: 19-Dec-1960   57 y.o.   MRN: [id number] Visit Date: 01/15/2018  Today's Provider: Mar Daring, PA-C   Chief Complaint  Patient presents with  . Urinary Tract Infection   Subjective:    Urinary Tract Infection   This is a new problem. The current episode started in the past 7 days. The problem has been gradually worsening. The quality of the pain is described as aching. The pain is at a severity of 0/10. The pain is mild. There has been no fever. She is sexually active. There is no history of pyelonephritis. Associated symptoms include frequency and urgency. Pertinent negatives include no chills, discharge, flank pain, hematuria, nausea or vomiting. Associated symptoms comments: Patient reports that she woke up 2 days ago and realize that she had poop on her self and after that she has had the frequent urination. She has tried nothing for the symptoms. The treatment provided no relief.      Allergies  Allergen Reactions  . Allegra [Fexofenadine] Nausea And Vomiting  . Avelox [Moxifloxacin] Other (See Comments)    Hallucinations  . Bactrim [Sulfamethoxazole-Trimethoprim] Other (See Comments)    unknown  . Etodolac Nausea Only  . Fish-Derived Products Nausea And Vomiting    With "seafood"  . Penicillins Diarrhea and Nausea And Vomiting  . Sulfa Antibiotics Other (See Comments)    unknown  . Macrobid [employer] Macro] Other (See Comments)    Constipation and globus hystericus.     Current Outpatient Medications:  .  albuterol (PROVENTIL HFA) 108 (90 Base) MCG/ACT inhaler, Inhale 2 puffs every 6 (six) hours as needed into the lungs., Disp: 3 Inhaler, Rfl: 1 .  Blood Glucose Monitoring Suppl (ONE TOUCH ULTRA SYSTEM KIT) w/Device KIT, To check blood sugar daily, Disp: 1 each, Rfl: 0 .  cetirizine (ZYRTEC) 10 MG tablet, Take 1 tablet (10 mg total) by mouth daily., Disp: 90 tablet, Rfl: 1 .  glucose blood (ONE TOUCH ULTRA  TEST) test strip, USE TEST STRIP(S) TO CHECK BLLOD SUGAR DAILY, Disp: 100 each, Rfl: 12 .  Lancets (ONETOUCH ULTRASOFT) lancets, USE LANCET(S)  TO CHECK GLUCOSE DAILY, Disp: 100 each, Rfl: 12 .  lisinopril-hydrochlorothiazide (PRINZIDE,ZESTORETIC) 20-12.5 MG tablet, Patient taking 2 TABLET BY MOUTH ONCE DAILY, Disp: 180 tablet, Rfl: 1 .  metFORMIN (GLUCOPHAGE) 1000 MG tablet, Take 1 tablet (1,000 mg total) by mouth daily with breakfast., Disp: 90 tablet, Rfl: 1 .  mometasone-formoterol (DULERA) 100-5 MCG/ACT AERO, Inhale 2 puffs into the lungs daily., Disp: 39 g, Rfl: 1 .  montelukast (SINGULAIR) 10 MG tablet, Take 1 tablet (10 mg total) by mouth daily., Disp: 90 tablet, Rfl: 3 .  sitaGLIPtin (JANUVIA) 100 MG tablet, Take 1 tablet (100 mg total) by mouth daily., Disp: 90 tablet, Rfl: 1 .  spironolactone (ALDACTONE) 25 MG tablet, Take 1 tablet (25 mg total) by mouth daily., Disp: 90 tablet, Rfl: 1 .  triamcinolone cream (KENALOG) 0.1 %, Apply 1 application topically 2 (two) times daily., Disp: 80 g, Rfl: 0 .  verapamil (CALAN) 80 MG tablet, Take 1 tablet (80 mg total) by mouth 2 (two) times daily. (Patient taking differently: Take 80 mg by mouth once. ), Disp: 180 tablet, Rfl: 1 .  lovastatin (MEVACOR) 20 MG tablet, Take 2 tablets (40 mg total) by mouth daily. (Patient not taking: Reported on 12/01/2017), Disp: 180 tablet, Rfl: 1 .  meloxicam (MOBIC) 15 MG tablet, Take 1 tablet (15  mg total) by mouth daily. (Patient not taking: Reported on 01/15/2018), Disp: 90 tablet, Rfl: 1 .  TRULICITY 1.5 KC/0.0LK SOPN, INJECT 1.5MG SUBCUTANEOUSLY ONCE A WEEK (Patient not taking: Reported on 12/01/2017), Disp: 12 pen, Rfl: 3  Review of Systems  Constitutional: Negative for chills.  Respiratory: Negative.   Cardiovascular: Negative.   Gastrointestinal: Negative for abdominal pain, nausea and vomiting.  Genitourinary: Positive for dysuria, enuresis, frequency and urgency. Negative for flank pain and hematuria.     Social History   Tobacco Use  . Smoking status: Former Smoker    Packs/day: 0.25    Years: 21.00    Pack years: 5.25    Last attempt to quit: 06/08/1997    Years since quitting: 20.6  . Smokeless tobacco: Never Used  Substance Use Topics  . Alcohol use: No   Objective:   BP 128/70 (BP Location: Right Wrist, Patient Position: Sitting, Cuff Size: Normal)   Pulse 60   Temp 98.1 F (36.7 C) (Oral)   Resp 16   Wt 214 lb (97.1 kg)   SpO2 99%   BMI 43.22 kg/m  Vitals:   01/15/18 0851  BP: 128/70  Pulse: 60  Resp: 16  Temp: 98.1 F (36.7 C)  TempSrc: Oral  SpO2: 99%  Weight: 214 lb (97.1 kg)     Physical Exam  Constitutional: She is oriented to person, place, and time. She appears well-developed and well-nourished. No distress.  Cardiovascular: Normal rate, regular rhythm and normal heart sounds. Exam reveals no gallop and no friction rub.  No murmur heard. Pulmonary/Chest: Effort normal and breath sounds normal. No respiratory distress. She has no wheezes. She has no rales.  Abdominal: Soft. Normal appearance and bowel sounds are normal. She exhibits no distension and no mass. There is no hepatosplenomegaly. There is tenderness in the suprapubic area. There is no rebound, no guarding and no CVA tenderness.  Neurological: She is alert and oriented to person, place, and time.  Skin: Skin is warm and dry. She is not diaphoretic.  Vitals reviewed.      Assessment & Plan:     1. Acute cystitis without hematuria Worsening symptoms. UA positive. Will treat empirically with Keflex due to patient allergies. Continue to push fluids. Urine sent for culture. Will follow up pending C&S results. She is to call if symptoms do not improve or if they worsen.  - cephALEXin (KEFLEX) 500 MG capsule; Take 1 capsule (500 mg total) by mouth 3 (three) times daily.  Dispense: 30 capsule; Refill: 0  2. Frequent urination See above medical treatment plan. - Urine Culture - POCT  urinalysis dipstick  3. Urgency of urination See above medical treatment plan. - Urine Culture - POCT urinalysis dipstick       Mar Daring, PA-C  [facility]

## 2018-01-15 NOTE — Patient Instructions (Signed)

## 2018-01-17 LAB — URINE CULTURE

## 2018-01-18 ENCOUNTER — Telehealth: Payer: Self-pay

## 2018-01-18 NOTE — Telephone Encounter (Signed)
-----   Message from Mar Daring, [place] sent at 01/18/2018  9:01 AM EDT ----- Urine culture is positive for e. Coli. It is susceptible to the antibiotic I placed you on. Take until completed.

## 2018-01-18 NOTE — Telephone Encounter (Signed)
Patient advised as below.  

## 2018-01-19 DIAGNOSIS — M2022 Hallux rigidus, left foot: Secondary | ICD-10-CM | POA: Diagnosis not present

## 2018-01-19 DIAGNOSIS — E119 Type 2 diabetes mellitus without complications: Secondary | ICD-10-CM | POA: Diagnosis not present

## 2018-01-19 DIAGNOSIS — M79672 Pain in left foot: Secondary | ICD-10-CM | POA: Diagnosis not present

## 2018-01-19 NOTE — Telephone Encounter (Signed)
Sent below message in error (wrong TE). -MM

## 2018-01-19 NOTE — Telephone Encounter (Signed)
See other TE. -MM

## 2018-01-19 NOTE — Telephone Encounter (Signed)
Called pt to schedule AWV and pt stated that she is busy up until September. Asked pt if she wanted to schedule after that and pt declined and states she not interested in scheduling an AWV. FYI to PCP. -MM

## 2018-01-22 ENCOUNTER — Encounter: Payer: Self-pay | Admitting: Obstetrics and Gynecology

## 2018-01-22 ENCOUNTER — Ambulatory Visit (INDEPENDENT_AMBULATORY_CARE_PROVIDER_SITE_OTHER): Payer: Medicare HMO | Admitting: Obstetrics and Gynecology

## 2018-01-22 DIAGNOSIS — N390 Urinary tract infection, site not specified: Secondary | ICD-10-CM | POA: Insufficient documentation

## 2018-01-22 DIAGNOSIS — N760 Acute vaginitis: Secondary | ICD-10-CM

## 2018-01-22 NOTE — Progress Notes (Signed)
Obstetrics & Gynecology Office Visit   Chief Complaint  Patient presents with  . Establish Care    Pt denies vaginal discharge, is currently being treater for UTI, pt just requesting a pelvic exam today, last pap 08/2017  Referral from [facility], Fenton Malling, PA-C, for establishment of care for recurrent UTIs, vaginal infections, gynecologic care  History of Present Illness: 57 y.o. G5P0020 who is seen in referral from [facility], Fenton Malling, [place], for general gynecologic care.  She is status post hysterectomy in 1990 (TAH) for heavy periods.  She has a history of recurrent urinary tract infections. She has one currently.  She states that she has an infection about every 3 months. This has been going on since she was diagnosed with diabetes. Her last hemoglobin A1c was 9.0 in March of this year.  She also has yeast infections.  The last time she had a yeast infection was about 1 year ago.  She had a pap smear in March this year.  It was normal with negative HPV. Her exam lists her cervix as being present. It is unclear why she might still have a cervix after an abdominal hysterectomy. However, she states that she has been told that she has scar tissue.  So, it might have been difficult to remove her cervix.  No operative report available from [place]. The surgery was not performed by this practice.  She states that she never had breast cancer. 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 Earlier, she had DCIS (ER/PR positive). She is followed at [facility] for her history of DCIS (ER/PR positive). She is status post tamoxifen therapy. She has no vaginal symptoms today and had a pelvic exam only 5 months ago.   Past Medical History:  Diagnosis Date  . Allergy   . Arthritis   . Breast cancer ([place])   . Breast cancer, left breast ([place]) 10/04/2014  . Diabetes mellitus without complication ([place])     . GERD (gastroesophageal reflux disease)   . Hyperlipidemia   . Hypertension   . Osteoporosis   . Sleep apnea     Past Surgical History:  Procedure Laterality Date  . ABDOMINAL HYSTERECTOMY     due to fibroid tumors 1990 Dr. Quenten Raven  . BREAST BIOPSY Left    negative  . BREAST BIOPSY Left    04/2013 positive  . BREAST CYST ASPIRATION Left   . BREAST EXCISIONAL BIOPSY Left    2015  . BREAST LUMPECTOMY Left 06/17/2013  . BREAST REDUCTION SURGERY Bilateral   . CESAREAN SECTION     x's 2  . CHOLECYSTECTOMY  09/2001  . COLONOSCOPY WITH PROPOFOL N/A 09/11/2017   Procedure: COLONOSCOPY WITH PROPOFOL;  Surgeon: Virgel Manifold, MD;  Location: [facility] ENDOSCOPY;  Service: Endoscopy;  Laterality: N/A;  . ESOPHAGOGASTRODUODENOSCOPY (EGD) WITH PROPOFOL N/A 09/11/2017   Procedure: ESOPHAGOGASTRODUODENOSCOPY (EGD) WITH PROPOFOL;  Surgeon: Virgel Manifold, MD;  Location: [facility] ENDOSCOPY;  Service: Endoscopy;  Laterality: N/A;  . REDUCTION MAMMAPLASTY    . s/p radiation therapy Left    2015    Gynecologic History: No LMP recorded. Patient has had a hysterectomy.  Obstetric History: [id number]  Family History  Problem Relation Age of Onset  . Hypertension Mother   . Cancer Mother        breast and rectal cancer  . Breast cancer Mother 62  . Asthma Brother   . Hypertension Brother   .  Allergic rhinitis Brother   . Breast cancer Maternal Grandmother     Social History   Socioeconomic History  . Marital status: Married    Spouse name: Elberta Fortis  . Number of children: 2  . Years of education: some college  . Highest education level: Not on file  Occupational History    Employer: DISABLED  Social Needs  . Financial resource strain: Not hard at all  . Food insecurity:    Worry: Never true    Inability: Never true  . Transportation needs:    Medical: No    Non-medical: No  Tobacco Use  . Smoking status: Former Smoker    Packs/day: 0.25    Years: 21.00    Pack years: 5.25     Last attempt to quit: 06/08/1997    Years since quitting: 20.6  . Smokeless tobacco: Never Used  Substance and Sexual Activity  . Alcohol use: No  . Drug use: No  . Sexual activity: Yes  Lifestyle  . Physical activity:    Days per week: 1 day    Minutes per session: 60 min  . Stress: Only a little  Relationships  . Social connections:    Talks on phone: More than three times a week    Gets together: More than three times a week    Attends religious service: More than 4 times per year    Active member of club or organization: Yes    Attends meetings of clubs or organizations: More than 4 times per year    Relationship status: Married  . Intimate partner violence:    Fear of current or ex partner: No    Emotionally abused: No    Physically abused: No    Forced sexual activity: No  Other Topics Concern  . Not on file  Social History Narrative  . Not on file    Allergies  Allergen Reactions  . Allegra [Fexofenadine] Nausea And Vomiting  . Avelox [Moxifloxacin] Other (See Comments)    Hallucinations  . Bactrim [Sulfamethoxazole-Trimethoprim] Other (See Comments)    unknown  . Etodolac Nausea Only  . Fish-Derived Products Nausea And Vomiting    With "seafood"  . Penicillins Diarrhea and Nausea And Vomiting  . Shellfish Allergy Nausea Only  . Sulfa Antibiotics Other (See Comments)    unknown  . Macrobid [employer] Macro] Other (See Comments)    Constipation and globus hystericus.    Prior to Admission medications   Medication Sig Start Date End Date Taking? Authorizing Provider  albuterol (PROVENTIL HFA) 108 (90 Base) MCG/ACT inhaler Inhale 2 puffs every 6 (six) hours as needed into the lungs. 04/13/17  Yes Mar Daring, PA-C  Blood Glucose Monitoring Suppl (ONE TOUCH ULTRA SYSTEM KIT) w/Device KIT To check blood sugar daily 09/10/16  Yes Mar Daring, PA-C  cephALEXin (KEFLEX) 500 MG capsule Take 1 capsule (500 mg total) by mouth 3 (three)  times daily. 01/15/18  Yes Mar Daring, PA-C  cetirizine (ZYRTEC) 10 MG tablet Take 1 tablet (10 mg total) by mouth daily. 07/24/15  Yes Margarita Rana, MD  glucose blood (ONE TOUCH ULTRA TEST) test strip USE TEST STRIP(S) TO CHECK BLLOD SUGAR DAILY 10/09/17  Yes [place], Anderson Malta M, PA-C  Lancets (ONETOUCH ULTRASOFT) lancets USE LANCET(S)  TO CHECK GLUCOSE DAILY 10/13/17  Yes Mar Daring, PA-C  lisinopril-hydrochlorothiazide (PRINZIDE,ZESTORETIC) 20-12.5 MG tablet Patient taking 2 TABLET BY MOUTH ONCE DAILY 05/08/17  Yes Fenton Malling M, PA-C  metFORMIN (GLUCOPHAGE) 1000  MG tablet Take 1 tablet (1,000 mg total) by mouth daily with breakfast. 05/08/17  Yes Burnette, Jennifer M, PA-C  mometasone-formoterol (DULERA) 100-5 MCG/ACT AERO Inhale 2 puffs into the lungs daily. 11/15/15  Yes Margarita Rana, MD  montelukast (SINGULAIR) 10 MG tablet Take 1 tablet (10 mg total) by mouth daily. 12/29/16  Yes Mar Daring, PA-C  sitaGLIPtin (JANUVIA) 100 MG tablet Take 1 tablet (100 mg total) by mouth daily. 05/08/17  Yes Mar Daring, PA-C  spironolactone (ALDACTONE) 25 MG tablet Take 1 tablet (25 mg total) by mouth daily. 05/08/17  Yes Mar Daring, PA-C  triamcinolone cream (KENALOG) 0.1 % Apply 1 application topically 2 (two) times daily. 11/05/17  Yes Mar Daring, PA-C  verapamil (CALAN) 80 MG tablet Take 1 tablet (80 mg total) by mouth 2 (two) times daily. 05/08/17  Yes Fenton Malling M, PA-C  lovastatin (MEVACOR) 20 MG tablet Take 2 tablets (40 mg total) by mouth daily. Patient not taking: Reported on 12/01/2017 05/08/17   Mar Daring, PA-C  meloxicam (MOBIC) 15 MG tablet Take 1 tablet (15 mg total) by mouth daily. Patient not taking: Reported on 01/15/2018 11/15/15   Margarita Rana, MD  TRULICITY 1.5 GD/9.2EQ SOPN INJECT 1.5MG SUBCUTANEOUSLY ONCE A WEEK Patient not taking: Reported on 12/01/2017 09/30/17   Mar Daring, PA-C    Review of  Systems  Constitutional: Negative.   HENT: Negative.   Eyes: Negative.   Respiratory: Negative.   Cardiovascular: Negative.   Gastrointestinal: Negative.   Genitourinary: Negative.   Musculoskeletal: Negative.   Skin: Negative.   Neurological: Negative.   Psychiatric/Behavioral: Negative.      Physical Exam BP 110/70 (BP Location: Right Arm, Patient Position: Sitting, Cuff Size: Large)   Pulse 95   Ht 4' 11" (1.499 m)   Wt 219 lb (99.3 kg)   SpO2 96%   BMI 44.23 kg/m  No LMP recorded. Patient has had a hysterectomy. Physical Exam  Constitutional: She is oriented to person, place, and time. She appears well-developed and well-nourished. No distress.  HENT:  Head: Normocephalic and atraumatic.  Eyes: Conjunctivae are normal. No scleral icterus.  Musculoskeletal: Normal range of motion. She exhibits no edema.  Neurological: She is alert and oriented to person, place, and time. No cranial nerve deficit.  Skin: Skin is warm and dry. No erythema.  Psychiatric: She has a normal mood and affect. Her behavior is normal. Judgment normal.    Female chaperone present for pelvic and breast  portions of the physical exam  Assessment: 57 y.o. G56P0020 female here for  1. Recurrent UTI   2. Recurrent vaginitis      Plan: Problem List Items Addressed This Visit      Genitourinary   Recurrent UTI   Recurrent vaginitis     Today was primarily an establishment of care session. Discussed that she could possibly benefit from ERT for recurrent UTIs and possibly vaginitis.  However, she has uncontrolled T2DM.  I encouraged her to optimize her blood glucose control through her PCP. If she continues to have recurrent UTIs or vaginitis at that point, we may consider topical estrogen therapy.  Follow up in a year for routine gynecologic exam, if she desires to have that accomplished here.  30 minutes spent in face to face discussion with > 50% spent in counseling,management, and coordination of  care of her recurrent UTIs and recurrent vaginitis. We also spent part of this time in discussing how her history of breast  dysplasia/neoplasia might affect treatment going forward.  Prentice Docker, MD 01/22/2018 5:25 PM     CC: Mar Daring, PA-C [place] [address] [place], [place] [id number]

## 2018-02-11 ENCOUNTER — Ambulatory Visit: Payer: Medicare HMO | Admitting: Allergy & Immunology

## 2018-02-12 ENCOUNTER — Other Ambulatory Visit: Payer: Self-pay | Admitting: Physician Assistant

## 2018-02-12 DIAGNOSIS — E1142 Type 2 diabetes mellitus with diabetic polyneuropathy: Secondary | ICD-10-CM

## 2018-02-12 DIAGNOSIS — I10 Essential (primary) hypertension: Secondary | ICD-10-CM

## 2018-02-12 DIAGNOSIS — J40 Bronchitis, not specified as acute or chronic: Secondary | ICD-10-CM

## 2018-02-21 ENCOUNTER — Encounter: Payer: Self-pay | Admitting: Emergency Medicine

## 2018-02-21 ENCOUNTER — Emergency Department: Payer: Medicare HMO

## 2018-02-21 ENCOUNTER — Other Ambulatory Visit: Payer: Self-pay

## 2018-02-21 ENCOUNTER — Emergency Department
Admission: EM | Admit: 2018-02-21 | Discharge: 2018-02-21 | Disposition: A | Discharge status: Home / Self Care | Payer: Medicare HMO | Attending: Emergency Medicine | Admitting: Emergency Medicine

## 2018-02-21 DIAGNOSIS — Z87891 Personal history of nicotine dependence: Secondary | ICD-10-CM | POA: Insufficient documentation

## 2018-02-21 DIAGNOSIS — Z7984 Long term (current) use of oral hypoglycemic drugs: Secondary | ICD-10-CM | POA: Insufficient documentation

## 2018-02-21 DIAGNOSIS — E1140 Type 2 diabetes mellitus with diabetic neuropathy, unspecified: Secondary | ICD-10-CM | POA: Diagnosis not present

## 2018-02-21 DIAGNOSIS — I10 Essential (primary) hypertension: Secondary | ICD-10-CM | POA: Insufficient documentation

## 2018-02-21 DIAGNOSIS — R109 Unspecified abdominal pain: Secondary | ICD-10-CM

## 2018-02-21 DIAGNOSIS — M25552 Pain in left hip: Secondary | ICD-10-CM | POA: Diagnosis present

## 2018-02-21 DIAGNOSIS — Z79899 Other long term (current) drug therapy: Secondary | ICD-10-CM | POA: Insufficient documentation

## 2018-02-21 LAB — CBC
HCT: 37.4 % (ref 35.0–47.0)
Hemoglobin: 12.7 g/dL (ref 12.0–16.0)
MCH: 29.6 pg (ref 26.0–34.0)
MCHC: 34 g/dL (ref 32.0–36.0)
MCV: 87.1 fL (ref 80.0–100.0)
Platelets: 284 10*3/uL (ref 150–440)
RBC: 4.3 MIL/uL (ref 3.80–5.20)
RDW: 13.6 % (ref 11.5–14.5)
WBC: 8.2 10*3/uL (ref 3.6–11.0)

## 2018-02-21 LAB — URINALYSIS, COMPLETE (UACMP) WITH MICROSCOPIC
Bilirubin Urine: NEGATIVE
Glucose, UA: >=500 mg/dL — AB
Hgb urine dipstick: NEGATIVE
Ketones, ur: NEGATIVE mg/dL
Leukocytes, UA: NEGATIVE
Nitrite: NEGATIVE
Protein, ur: 30 mg/dL — AB
Specific Gravity, Urine: 1.035 — ABNORMAL HIGH (ref 1.005–1.030)
pH: 5 (ref 5.0–8.0)

## 2018-02-21 LAB — BASIC METABOLIC PANEL
Anion gap: 8 (ref 5–15)
BUN: 16 mg/dL (ref 6–20)
CO2: 28 mmol/L (ref 22–32)
Calcium: 9.6 mg/dL (ref 8.9–10.3)
Chloride: 103 mmol/L (ref 98–111)
Creatinine, Ser: 0.78 mg/dL (ref 0.44–1.00)
GFR calc Af Amer: >60 mL/min (ref >60)
GFR calc non Af Amer: >60 mL/min (ref >60)
Glucose, Bld: 188 mg/dL — ABNORMAL HIGH (ref 70–99)
Potassium: 3.8 mmol/L (ref 3.5–5.1)
Sodium: 139 mmol/L (ref 135–145)

## 2018-02-21 NOTE — ED Notes (Signed)
NAD noted at time of D/C. Pt denies questions or concerns. Pt ambulatory to the lobby at this time.  

## 2018-02-21 NOTE — ED Triage Notes (Signed)
Here for left groin pain that radiates towards hip.  Reports UTI last month.  Pain worse with movement.  No vomiting.  Unsure if increased urination because on diuretics.  No vomiting or diarrhea.  No fevers.  No injury.

## 2018-02-21 NOTE — ED Notes (Signed)
EDP at bedside. Pt c/o L/hip pain that get worse while walking.  Pt denies any blood in UA. Pt denies any falls/CP/SOB. NAD.

## 2018-02-21 NOTE — ED Provider Notes (Addendum)
[facility] Emergency Department Provider Note  Time seen: 6:43 PM  I have reviewed the triage vital signs and the nursing notes.   HISTORY  Chief Complaint Groin Pain    HPI Alexis Lloyd is a 57 y.o. female with a past medical history of diabetes, gastric reflux, hypertension, hyperlipidemia presents to the emergency department for left hip pain.  According to the patient yesterday she developed pain in her left hip which is worse with walking.  States mild pain currently but 8/10 aching pain while walking.  Denies any dysuria or hematuria.  No fever, nausea vomiting or diarrhea.  No abdominal pain.   Past Medical History:  Diagnosis Date  . Allergy   . Arthritis   . Breast cancer ([place])   . Breast cancer, left breast ([place]) 10/04/2014  . Diabetes mellitus without complication ([place])   . GERD (gastroesophageal reflux disease)   . Hyperlipidemia   . Hypertension   . Osteoporosis   . Sleep apnea     Patient Active Problem List   Diagnosis Date Noted  . Recurrent UTI 01/22/2018  . Recurrent vaginitis 01/22/2018  . History of Helicobacter pylori infection 11/05/2017  . Intrinsic eczema 11/05/2017  . CLE (columnar lined esophagus)   . Stomach irritation   . Gastric polyp   . Problems with swallowing and mastication   . Heartburn   . Benign neoplasm of transverse colon   . Special screening for malignant neoplasms, colon   . Internal hemorrhoids   . Diverticulosis of large intestine without diverticulitis   . Warts of foot 06/30/2017  . Achilles tendon contracture 01/28/2017  . Hallux rigidus of left foot 01/28/2017  . Hallux rigidus, right foot 01/28/2017  . Pes planus 01/28/2017  . Posterior tibial tendinitis of left lower extremity 01/28/2017  . Posterior tibial tendinitis of right lower extremity 01/28/2017  . Asthma 09/03/2015  . Foot pain 07/16/2015  . DM type 2, uncontrolled, with neuropathy ([place]) 07/13/2015  . Abnormal finding on  mammography, microcalcification 04/25/2015  . Intraductal carcinoma of breast 04/24/2015  . Airway hyperreactivity 11/18/2014  . BMI 40.0-44.9, adult ([place]) 11/18/2014  . Breast cyst 11/18/2014  . Diabetic neuropathy ([place]) 11/18/2014  . GERD (gastroesophageal reflux disease) 11/18/2014  . 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 Hypercholesteremia 11/18/2014  . Hypertension 11/18/2014  . Arthritis, degenerative 11/18/2014  . Allergic rhinitis, seasonal 11/18/2014  . OSA (obstructive sleep apnea) 11/18/2014  . AC (acromioclavicular) arthritis 11/08/2014  . Personal history of breast cancer 10/04/2014  . Primary osteoarthritis of right knee 11/14/2013    Past Surgical History:  Procedure Laterality Date  . ABDOMINAL HYSTERECTOMY     due to fibroid tumors 1990 Dr. Quenten Raven  . BREAST BIOPSY Left    negative  . BREAST BIOPSY Left    04/2013 positive  . BREAST CYST ASPIRATION Left   . BREAST EXCISIONAL BIOPSY Left    2015  . BREAST LUMPECTOMY Left 06/17/2013  . BREAST REDUCTION SURGERY Bilateral   . CESAREAN SECTION     x's 2  . CHOLECYSTECTOMY  09/2001  . COLONOSCOPY WITH PROPOFOL N/A 09/11/2017   Procedure: COLONOSCOPY WITH PROPOFOL;  Surgeon: Virgel Manifold, MD;  Location: [facility] ENDOSCOPY;  Service: Endoscopy;  Laterality: N/A;  . ESOPHAGOGASTRODUODENOSCOPY (EGD) WITH PROPOFOL N/A 09/11/2017   Procedure: ESOPHAGOGASTRODUODENOSCOPY (EGD) WITH PROPOFOL;  Surgeon: Virgel Manifold, MD;  Location: [facility] ENDOSCOPY;  Service: Endoscopy;  Laterality: N/A;  . REDUCTION MAMMAPLASTY    . s/p radiation therapy Left  2015    Prior to Admission medications   Medication Sig Start Date End Date Taking? Authorizing Provider  albuterol (PROVENTIL HFA) 108 (90 Base) MCG/ACT inhaler Inhale 2 puffs every 6 (six) hours as needed into the lungs. 04/13/17   Mar Daring, PA-C  Blood Glucose Monitoring Suppl (ONE TOUCH ULTRA SYSTEM KIT) w/Device KIT To check blood sugar daily 09/10/16    Mar Daring, PA-C  cephALEXin (KEFLEX) 500 MG capsule Take 1 capsule (500 mg total) by mouth 3 (three) times daily. 01/15/18   Mar Daring, PA-C  cetirizine (ZYRTEC) 10 MG tablet Take 1 tablet (10 mg total) by mouth daily. 07/24/15   Margarita Rana, MD  glucose blood (ONE TOUCH ULTRA TEST) test strip USE TEST STRIP(S) TO CHECK BLLOD SUGAR DAILY 10/09/17   Mar Daring, PA-C  Lancets [facility] ULTRASOFT) lancets USE LANCET(S)  TO CHECK GLUCOSE DAILY 10/13/17   Mar Daring, PA-C  lisinopril-hydrochlorothiazide (PRINZIDE,ZESTORETIC) 20-12.5 MG tablet Patient taking 2 TABLET BY MOUTH ONCE DAILY 05/08/17   Mar Daring, PA-C  lisinopril-hydrochlorothiazide (PRINZIDE,ZESTORETIC) 20-12.5 MG tablet TAKE 2 TABLETS BY MOUTH ONCE DAILY 02/12/18   Mar Daring, PA-C  lovastatin (MEVACOR) 20 MG tablet Take 2 tablets (40 mg total) by mouth daily. Patient not taking: Reported on 12/01/2017 05/08/17   Mar Daring, PA-C  meloxicam (MOBIC) 15 MG tablet Take 1 tablet (15 mg total) by mouth daily. Patient not taking: Reported on 01/15/2018 11/15/15   Margarita Rana, MD  metFORMIN (GLUCOPHAGE) 1000 MG tablet Take 1 tablet (1,000 mg total) by mouth daily with breakfast. 05/08/17   Mar Daring, PA-C  metFORMIN (GLUCOPHAGE) 1000 MG tablet TAKE 1 TABLET BY MOUTH TWICE DAILY WITH A MEAL 02/12/18   [place], Anderson Malta M, PA-C  mometasone-formoterol (DULERA) 100-5 MCG/ACT AERO Inhale 2 puffs into the lungs daily. 11/15/15   Margarita Rana, MD  montelukast (SINGULAIR) 10 MG tablet TAKE 1 TABLET BY MOUTH ONCE DAILY 02/12/18   Mar Daring, PA-C  sitaGLIPtin (JANUVIA) 100 MG tablet Take 1 tablet (100 mg total) by mouth daily. 05/08/17   Mar Daring, PA-C  spironolactone (ALDACTONE) 25 MG tablet TAKE 1 TABLET BY MOUTH ONCE DAILY 02/12/18   Mar Daring, PA-C  triamcinolone cream (KENALOG) 0.1 % Apply 1 application topically 2 (two) times daily. 11/05/17    Mar Daring, PA-C  TRULICITY 1.5 OV/5.6EP SOPN INJECT 1.5MG SUBCUTANEOUSLY ONCE A WEEK Patient not taking: Reported on 12/01/2017 09/30/17   Mar Daring, PA-C  verapamil (CALAN) 80 MG tablet Take 1 tablet (80 mg total) by mouth 2 (two) times daily. 05/08/17   Mar Daring, PA-C    Allergies  Allergen Reactions  . Allegra [Fexofenadine] Nausea And Vomiting  . Avelox [Moxifloxacin] Other (See Comments)    Hallucinations  . Bactrim [Sulfamethoxazole-Trimethoprim] Other (See Comments)    unknown  . Etodolac Nausea Only  . Fish-Derived Products Nausea And Vomiting    With "seafood"  . Penicillins Diarrhea and Nausea And Vomiting  . Shellfish Allergy Nausea Only  . Sulfa Antibiotics Other (See Comments)    unknown  . Macrobid [employer] Macro] Other (See Comments)    Constipation and globus hystericus.    Family History  Problem Relation Age of Onset  . Hypertension Mother   . Cancer Mother        breast and rectal cancer  . Breast cancer Mother 67  . Asthma Brother   . Hypertension Brother   . Allergic  rhinitis Brother   . Breast cancer Maternal Grandmother     Social History Social History   Tobacco Use  . Smoking status: Former Smoker    Packs/day: 0.25    Years: 21.00    Pack years: 5.25    Last attempt to quit: 06/08/1997    Years since quitting: 20.7  . Smokeless tobacco: Never Used  Substance Use Topics  . Alcohol use: No  . Drug use: No    Review of Systems Constitutional: Negative for fever. Cardiovascular: Negative for chest pain. Respiratory: Negative for shortness of breath. Gastrointestinal: Negative for abdominal pain, vomiting and diarrhea. Genitourinary: Negative for urinary compaints Musculoskeletal: Left hip pain. Skin: Negative for skin complaints  Neurological: Negative for headache All other ROS negative  ____________________________________________   PHYSICAL EXAM:  Constitutional: Alert and  oriented. Well appearing and in no distress. Eyes: Normal exam ENT   Head: Normocephalic and atraumatic.   Mouth/Throat: Mucous membranes are moist. Cardiovascular: Normal rate, regular rhythm. No murmur Respiratory: Normal respiratory effort without tachypnea nor retractions. Breath sounds are clear Gastrointestinal: Soft and nontender. No distention.   Musculoskeletal: Mild tenderness to left hip, good range of motion.  Neurovascular intact distally.  Tenderness with left lower extremity abduction. Neurologic:  Normal speech and language. No gross focal neurologic deficits Skin:  Skin is warm, dry and intact.  Psychiatric: Mood and affect are normal.  ____________________________________________    RADIOLOGY  X-ray negative  ____________________________________________   INITIAL IMPRESSION / ASSESSMENT AND PLAN / ED COURSE  Pertinent labs & imaging results that were available during my care of the patient were reviewed by me and considered in my medical decision making (see chart for details).  Presents emergency department for left hip pain since yesterday worse with walking or movement.  Tenderness over the left hip on examination with pain with abduction or walking.  Patient has a completely benign abdominal exam reassuringly.  Urinalysis appears normal we will send the urine culture as a precaution.  Will obtain x-ray imaging of left hip and continue to closely monitor.  Patient agreeable to plan of care.  Patient's x-rays negative.  Highly suspect musculoskeletal pain.  Urine culture is pending and will discharge with a short course of pain medication have the patient follow-up with her primary care doctor.    Patient states she feels like it could be something else and really wishes to have a CT scan performed.  States she is having flank pain and is worried about her kidney or possible kidney stone.  CT renal scan was ordered by myself it is negative.  I offered to  discharge her with pain medication she states she would rather just take ibuprofen at home and she will follow-up with her doctor.  I did discuss return precautions.  ____________________________________________   FINAL CLINICAL IMPRESSION(S) / ED DIAGNOSES  Left hip pain    Harvest Dark, MD 02/21/18 Si Raider    Harvest Dark, MD 02/21/18 2004

## 2018-02-22 DIAGNOSIS — B353 Tinea pedis: Secondary | ICD-10-CM | POA: Diagnosis not present

## 2018-02-22 DIAGNOSIS — L249 Irritant contact dermatitis, unspecified cause: Secondary | ICD-10-CM | POA: Diagnosis not present

## 2018-02-22 DIAGNOSIS — L821 Other seborrheic keratosis: Secondary | ICD-10-CM | POA: Diagnosis not present

## 2018-02-22 DIAGNOSIS — R21 Rash and other nonspecific skin eruption: Secondary | ICD-10-CM | POA: Diagnosis not present

## 2018-02-25 ENCOUNTER — Ambulatory Visit: Payer: Medicare HMO | Admitting: Allergy & Immunology

## 2018-03-01 ENCOUNTER — Ambulatory Visit: Payer: Self-pay | Admitting: Physician Assistant

## 2018-03-02 ENCOUNTER — Encounter: Payer: Self-pay | Admitting: Physician Assistant

## 2018-03-02 ENCOUNTER — Ambulatory Visit (INDEPENDENT_AMBULATORY_CARE_PROVIDER_SITE_OTHER): Payer: Medicare HMO | Admitting: Physician Assistant

## 2018-03-02 ENCOUNTER — Telehealth: Payer: Self-pay | Admitting: Physician Assistant

## 2018-03-02 VITALS — BP 116/74 | HR 75 | Temp 98.4°F | Wt 217.0 lb

## 2018-03-02 DIAGNOSIS — E7800 Pure hypercholesterolemia, unspecified: Secondary | ICD-10-CM

## 2018-03-02 DIAGNOSIS — I10 Essential (primary) hypertension: Secondary | ICD-10-CM | POA: Diagnosis not present

## 2018-03-02 DIAGNOSIS — E1140 Type 2 diabetes mellitus with diabetic neuropathy, unspecified: Secondary | ICD-10-CM | POA: Diagnosis not present

## 2018-03-02 DIAGNOSIS — E1165 Type 2 diabetes mellitus with hyperglycemia: Secondary | ICD-10-CM

## 2018-03-02 DIAGNOSIS — E119 Type 2 diabetes mellitus without complications: Secondary | ICD-10-CM

## 2018-03-02 DIAGNOSIS — Z2821 Immunization not carried out because of patient refusal: Secondary | ICD-10-CM | POA: Diagnosis not present

## 2018-03-02 DIAGNOSIS — IMO0002 Reserved for concepts with insufficient information to code with codable children: Secondary | ICD-10-CM

## 2018-03-02 LAB — POCT GLYCOSYLATED HEMOGLOBIN (HGB A1C): Hemoglobin A1C: 13.8 % — AB (ref 4.0–5.6)

## 2018-03-02 MED ORDER — METFORMIN HCL 1000 MG PO TABS: 1000.0000 mg | ORAL_TABLET | Freq: Two times a day (BID) | ORAL | 1 refills | Status: DC

## 2018-03-02 MED ORDER — SITAGLIPTIN PHOSPHATE 100 MG PO TABS: 100.0000 mg | ORAL_TABLET | Freq: Every day | ORAL | 1 refills | Status: DC

## 2018-03-02 MED ORDER — SEMAGLUTIDE (1 MG/DOSE) 2 MG/1.5ML ~~LOC~~ SOPN: 1.0000 mg | PEN_INJECTOR | SUBCUTANEOUS | 5 refills | Status: DC

## 2018-03-02 NOTE — Telephone Encounter (Signed)
Then to get a cheaper price we would have to contact the pharmaceutical company for program assistance. I can refer her to Almyra Free the clinical pharmacist in the office to get help trying to get this medication.

## 2018-03-02 NOTE — Telephone Encounter (Signed)
lmtcb

## 2018-03-02 NOTE — Progress Notes (Signed)
Patient: Alexis Lloyd Female    DOB: 03/20/1961   57 y.o.   MRN: [id number] Visit Date: 03/02/2018  Today's Provider: Mar Daring, PA-C   Chief Complaint  Patient presents with  . Hypertension  . Hyperlipidemia  . Diabetes   Subjective:    Hypertension  This is a chronic problem. The problem is unchanged. The problem is controlled. Pertinent negatives include no anxiety, blurred vision, chest pain, headaches, malaise/fatigue, neck pain, orthopnea, palpitations, peripheral edema, PND, shortness of breath or sweats. There are no associated agents to hypertension. Risk factors for coronary artery disease include diabetes mellitus, obesity and dyslipidemia. There are no compliance problems.   Hyperlipidemia  This is a chronic problem. The problem is controlled. Recent lipid tests were reviewed and are normal. Pertinent negatives include no chest pain, focal sensory loss, focal weakness, leg pain, myalgias or shortness of breath. Current antihyperlipidemic treatment includes statins. There are no compliance problems.   Diabetes  She presents for her follow-up diabetic visit. She has type 2 diabetes mellitus. Her disease course has been stable. There are no hypoglycemic associated symptoms. Pertinent negatives for hypoglycemia include no dizziness, headaches or sweats. Pertinent negatives for diabetes include no blurred vision, no chest pain, no fatigue, no foot paresthesias, no foot ulcerations, no polydipsia, no polyphagia, no polyuria, no visual change, no weakness and no weight loss. Symptoms are stable. Her weight is stable. She is following a diabetic and generally healthy diet. Home blood sugar record trend: Fasting Blood sugars are 120's - 130's. She does not see a podiatrist.Eye exam is current.   Lab Results  Component Value Date   CHOL 164 08/31/2017   CHOL 155 08/28/2016   CHOL 158 07/18/2015   Lab Results  Component Value Date   HDL 28 (L) 08/31/2017   HDL 30  (L) 08/28/2016   HDL 27 (L) 07/18/2015   Lab Results  Component Value Date   LDLCALC 113 (H) 08/31/2017   LDLCALC 104 (H) 08/28/2016   LDLCALC 95 07/18/2015   Lab Results  Component Value Date   TRIG 117 08/31/2017   TRIG 103 08/28/2016   TRIG 178 (H) 07/18/2015   Lab Results  Component Value Date   CHOLHDL 5.2 08/28/2016   CHOLHDL 5.9 (H) 07/18/2015   No results found for: LDLDIRECT Lab Results  Component Value Date   HGBA1C 9.0 (H) 08/31/2017   BP Readings from Last 3 Encounters:  03/02/18 116/74  02/21/18 139/65  01/22/18 110/70   Wt Readings from Last 3 Encounters:  03/02/18 217 lb (98.4 kg)  02/21/18 210 lb (95.3 kg)  01/22/18 219 lb (99.3 kg)      Allergies  Allergen Reactions  . Allegra [Fexofenadine] Nausea And Vomiting  . Avelox [Moxifloxacin] Other (See Comments)    Hallucinations  . Bactrim [Sulfamethoxazole-Trimethoprim] Other (See Comments)    unknown  . Etodolac Nausea Only  . Fish-Derived Products Nausea And Vomiting    With "seafood"  . Penicillins Diarrhea and Nausea And Vomiting  . Shellfish Allergy Nausea Only  . Sulfa Antibiotics Other (See Comments)    unknown  . Macrobid [employer] Macro] Other (See Comments)    Constipation and globus hystericus.     Current Outpatient Medications:  .  albuterol (PROVENTIL HFA) 108 (90 Base) MCG/ACT inhaler, Inhale 2 puffs every 6 (six) hours as needed into the lungs., Disp: 3 Inhaler, Rfl: 1 .  Blood Glucose Monitoring Suppl (ONE TOUCH ULTRA SYSTEM KIT) w/Device  KIT, To check blood sugar daily, Disp: 1 each, Rfl: 0 .  cetirizine (ZYRTEC) 10 MG tablet, Take 1 tablet (10 mg total) by mouth daily., Disp: 90 tablet, Rfl: 1 .  glucose blood (ONE TOUCH ULTRA TEST) test strip, USE TEST STRIP(S) TO CHECK BLLOD SUGAR DAILY, Disp: 100 each, Rfl: 12 .  Lancets (ONETOUCH ULTRASOFT) lancets, USE LANCET(S)  TO CHECK GLUCOSE DAILY, Disp: 100 each, Rfl: 12 .  lisinopril-hydrochlorothiazide  (PRINZIDE,ZESTORETIC) 20-12.5 MG tablet, Patient taking 2 TABLET BY MOUTH ONCE DAILY, Disp: 180 tablet, Rfl: 1 .  lovastatin (MEVACOR) 20 MG tablet, Take 2 tablets (40 mg total) by mouth daily., Disp: 180 tablet, Rfl: 1 .  meloxicam (MOBIC) 15 MG tablet, Take 1 tablet (15 mg total) by mouth daily., Disp: 90 tablet, Rfl: 1 .  metFORMIN (GLUCOPHAGE) 1000 MG tablet, Take 1 tablet (1,000 mg total) by mouth daily with breakfast., Disp: 90 tablet, Rfl: 1 .  mometasone-formoterol (DULERA) 100-5 MCG/ACT AERO, Inhale 2 puffs into the lungs daily., Disp: 39 g, Rfl: 1 .  montelukast (SINGULAIR) 10 MG tablet, TAKE 1 TABLET BY MOUTH ONCE DAILY, Disp: 30 tablet, Rfl: 11 .  sitaGLIPtin (JANUVIA) 100 MG tablet, Take 1 tablet (100 mg total) by mouth daily., Disp: 90 tablet, Rfl: 1 .  spironolactone (ALDACTONE) 25 MG tablet, TAKE 1 TABLET BY MOUTH ONCE DAILY, Disp: 90 tablet, Rfl: 1 .  triamcinolone cream (KENALOG) 0.1 %, Apply 1 application topically 2 (two) times daily., Disp: 80 g, Rfl: 0 .  verapamil (CALAN) 80 MG tablet, Take 1 tablet (80 mg total) by mouth 2 (two) times daily., Disp: 180 tablet, Rfl: 1 .  cephALEXin (KEFLEX) 500 MG capsule, Take 1 capsule (500 mg total) by mouth 3 (three) times daily. (Patient not taking: Reported on 03/02/2018), Disp: 30 capsule, Rfl: 0 .  lisinopril-hydrochlorothiazide (PRINZIDE,ZESTORETIC) 20-12.5 MG tablet, TAKE 2 TABLETS BY MOUTH ONCE DAILY, Disp: 180 tablet, Rfl: 1 .  metFORMIN (GLUCOPHAGE) 1000 MG tablet, TAKE 1 TABLET BY MOUTH TWICE DAILY WITH A MEAL (Patient not taking: Reported on 03/02/2018), Disp: 180 tablet, Rfl: 1 .  TRULICITY 1.5 XF/8.1WE SOPN, INJECT 1.5MG SUBCUTANEOUSLY ONCE A WEEK (Patient not taking: Reported on 12/01/2017), Disp: 12 pen, Rfl: 3  Review of Systems  Constitutional: Negative for fatigue, malaise/fatigue and weight loss.  Eyes: Negative for blurred vision and visual disturbance.  Respiratory: Negative for cough, chest tightness and shortness of  breath.   Cardiovascular: Negative for chest pain, palpitations, orthopnea, leg swelling and PND.  Gastrointestinal: Negative for abdominal pain.  Endocrine: Negative for polydipsia, polyphagia and polyuria.  Musculoskeletal: Negative for myalgias and neck pain.  Neurological: Negative for dizziness, focal weakness, weakness and headaches.    Social History   Tobacco Use  . Smoking status: Former Smoker    Packs/day: 0.25    Years: 21.00    Pack years: 5.25    Last attempt to quit: 06/08/1997    Years since quitting: 20.7  . Smokeless tobacco: Never Used  Substance Use Topics  . Alcohol use: No   Objective:   BP 116/74 (BP Location: Right Arm, Patient Position: Sitting, Cuff Size: Large)   Pulse 75   Temp 98.4 F (36.9 C) (Oral)   Wt 217 lb (98.4 kg)   SpO2 97%   BMI 45.35 kg/m  Vitals:   03/02/18 1051  BP: 116/74  Pulse: 75  Temp: 98.4 F (36.9 C)  TempSrc: Oral  SpO2: 97%  Weight: 217 lb (98.4 kg)    Results  for orders placed or performed in visit on 03/02/18  POCT glycosylated hemoglobin (Hb A1C)  Result Value Ref Range   Hemoglobin A1C 13.8 (A) 4.0 - 5.6 %    Physical Exam  Constitutional: She appears well-developed and well-nourished. No distress.  Neck: Normal range of motion. Neck supple.  Cardiovascular: Normal rate, regular rhythm and normal heart sounds. Exam reveals no gallop and no friction rub.  No murmur heard. Pulmonary/Chest: Effort normal and breath sounds normal. No respiratory distress. She has no wheezes. She has no rales.  Skin: She is not diaphoretic.  Vitals reviewed.       Assessment & Plan:     1. DM type 2, uncontrolled, with neuropathy (HCC) A1c uncontrolled with reading at 13.8 now. Patient non compliant with medications. Only taking metformin once daily, stopped trulicity and reports to be taking Januvia, but has not been filled since Nov 2018 (was given only 6 month supply at that time), so she cannot be taking regularly.  Discussed importance of taking medications as prescribed to prevent end-organ damage from uncontrolled diabetes. She desires to try ozempic instead of trulicity. This was prescribed as below. Also refilled metformin to take BID. Januvia refilled as well since I do not believe she has this medication on hand. I will see her back in 1-3 months for f/u T2DM.  - POCT glycosylated hemoglobin (Hb A1C) - Semaglutide, 1 MG/DOSE, (OZEMPIC, 1 MG/DOSE,) 2 MG/1.5ML SOPN; Inject 1 mg into the skin once a week.  Dispense: 3 pen; Refill: 5 - sitaGLIPtin (JANUVIA) 100 MG tablet; Take 1 tablet (100 mg total) by mouth daily.  Dispense: 90 tablet; Refill: 1 - metFORMIN (GLUCOPHAGE) 1000 MG tablet; Take 1 tablet (1,000 mg total) by mouth 2 (two) times daily with a meal.  Dispense: 180 tablet; Refill: 1  2. Essential hypertension Stable. Continue Lisinopril-HCTZ 20-12.53m, spironolactone 285m and Verapamil 8059mID.   3. Hypercholesteremia Stable. Continue Lovastatin 87m103mily.   4. Influenza vaccination declined       JennMar Daring-C  [facility]

## 2018-03-02 NOTE — Telephone Encounter (Signed)
Pt called saying the Ozempic 1mg  is still 200.00 and she can not afford this.  She wants to know what she needs to do.  Do you know if there are any coupons she can use  Pt's CB#  [phone]  Thanks Con Memos

## 2018-03-02 NOTE — Patient Instructions (Signed)
Semaglutide injection solution What is this medicine? SEMAGLUTIDE (Sem a GLOO tide) is used to improve blood sugar control in adults with type 2 diabetes. This medicine may be used with other diabetes medicines. This medicine may be used for other purposes; ask your health care provider or pharmacist if you have questions. COMMON BRAND NAME(S): OZEMPIC What should I tell my health care provider before I take this medicine? They need to know if you have any of these conditions: -endocrine tumors (MEN 2) or if someone in your family had these tumors -eye disease, vision problems -history of pancreatitis -kidney disease -stomach problems -thyroid cancer or if someone in your family had thyroid cancer -an unusual or allergic reaction to semaglutide, other medicines, foods, dyes, or preservatives -pregnant or trying to get pregnant -breast-feeding How should I use this medicine? This medicine is for injection under the skin of your upper leg (thigh), stomach area, or upper arm. It is given once every week (every 7 days). You will be taught how to prepare and give this medicine. Use exactly as directed. Take your medicine at regular intervals. Do not take it more often than directed. If you use this medicine with insulin, you should inject this medicine and the insulin separately. Do not mix them together. Do not give the injections right next to each other. Change (rotate) injection sites with each injection. It is important that you put your used needles and syringes in a special sharps container. Do not put them in a trash can. If you do not have a sharps container, call your pharmacist or healthcare provider to get one. A special MedGuide will be given to you by the pharmacist with each prescription and refill. Be sure to read this information carefully each time. Talk to your pediatrician regarding the use of this medicine in children. Special care may be needed. Overdosage: If you think you  have taken too much of this medicine contact a poison control center or emergency room at once. NOTE: This medicine is only for you. Do not share this medicine with others. What if I miss a dose? If you miss a dose, take it as soon as you can within 5 days after the missed dose. Then take your next dose at your regular weekly time. If it has been longer than 5 days after the missed dose, do not take the missed dose. Take the next dose at your regular time. Do not take double or extra doses. If you have questions about a missed dose, contact your health care provider for advice. What may interact with this medicine? -other medicines for diabetes Many medications may cause changes in blood sugar, these include: -alcohol containing beverages -antiviral medicines for HIV or AIDS -aspirin and aspirin-like drugs -certain medicines for blood pressure, heart disease, irregular heart beat -chromium -diuretics -female hormones, such as estrogens or progestins, birth control pills -fenofibrate -gemfibrozil -isoniazid -lanreotide -female hormones or anabolic steroids -MAOIs like Carbex, Eldepryl, Marplan, Nardil, and Parnate -medicines for weight loss -medicines for allergies, asthma, cold, or cough -medicines for depression, anxiety, or psychotic disturbances -niacin -nicotine -NSAIDs, medicines for pain and inflammation, like ibuprofen or naproxen -octreotide -pasireotide -pentamidine -phenytoin -probenecid -quinolone antibiotics such as ciprofloxacin, levofloxacin, ofloxacin -some herbal dietary supplements -steroid medicines such as prednisone or cortisone -sulfamethoxazole; trimethoprim -thyroid hormones Some medications can hide the warning symptoms of low blood sugar (hypoglycemia). You may need to monitor your blood sugar more closely if you are taking one of these medications. These include: -  beta-blockers, often used for high blood pressure or heart problems (examples include  atenolol, metoprolol, propranolol) -clonidine -guanethidine -reserpine This list may not describe all possible interactions. Give your health care provider a list of all the medicines, herbs, non-prescription drugs, or dietary supplements you use. Also tell them if you smoke, drink alcohol, or use illegal drugs. Some items may interact with your medicine. What should I watch for while using this medicine? Visit your doctor or health care professional for regular checks on your progress. Drink plenty of fluids while taking this medicine. Check with your doctor or health care professional if you get an attack of severe diarrhea, nausea, and vomiting. The loss of too much body fluid can make it dangerous for you to take this medicine. A test called the HbA1C (A1C) will be monitored. This is a simple blood test. It measures your blood sugar control over the last 2 to 3 months. You will receive this test every 3 to 6 months. Learn how to check your blood sugar. Learn the symptoms of low and high blood sugar and how to manage them. Always carry a quick-source of sugar with you in case you have symptoms of low blood sugar. Examples include hard sugar candy or glucose tablets. Make sure others know that you can choke if you eat or drink when you develop serious symptoms of low blood sugar, such as seizures or unconsciousness. They must get medical help at once. Tell your doctor or health care professional if you have high blood sugar. You might need to change the dose of your medicine. If you are sick or exercising more than usual, you might need to change the dose of your medicine. Do not skip meals. Ask your doctor or health care professional if you should avoid alcohol. Many nonprescription cough and cold products contain sugar or alcohol. These can affect blood sugar. Pens should never be shared. Even if the needle is changed, sharing may result in passing of viruses like hepatitis or HIV. Wear a medical  ID bracelet or chain, and carry a card that describes your disease and details of your medicine and dosage times. What side effects may I notice from receiving this medicine? Side effects that you should report to your doctor or health care professional as soon as possible: -allergic reactions like skin rash, itching or hives, swelling of the face, lips, or tongue -breathing problems -changes in vision -diarrhea that continues or is severe -lump or swelling on the neck -severe nausea -signs and symptoms of infection like fever or chills; cough; sore throat; pain or trouble passing urine -signs and symptoms of low blood sugar such as feeling anxious, confusion, dizziness, increased hunger, unusually weak or tired, sweating, shakiness, cold, irritable, headache, blurred vision, fast heartbeat, loss of consciousness -signs and symptoms of kidney injury like trouble passing urine or change in the amount of urine -trouble swallowing -unusual stomach upset or pain -vomiting Side effects that usually do not require medical attention (report to your doctor or health care professional if they continue or are bothersome): -constipation -diarrhea -nausea -pain, redness, or irritation at site where injected -stomach upset This list may not describe all possible side effects. Call your doctor for medical advice about side effects. You may report side effects to FDA at 1-800-FDA-1088. Where should I keep my medicine? Keep out of the reach of children. Store unopened pens in a refrigerator between 2 and 8 degrees C (36 and 46 degrees F). Do not freeze. Protect from  to your doctor or health care professional if they continue or are bothersome):  -constipation  -diarrhea  -nausea  -pain, redness, or irritation at site where injected  -stomach upset  This list may not describe all possible side effects. Call your doctor for medical advice about side effects. You may report side effects to FDA at 1-800-FDA-1088.  Where should I keep my medicine?  Keep out of the reach of children.  Store unopened pens in a refrigerator between 2 and 8 degrees C (36 and 46 degrees F). Do not freeze. Protect from light and heat. After you first use the pen, it can be stored for 56 days at room temperature between 15 and 30 degrees C (59 and 86 degrees F) or in a refrigerator. Throw away  your used pen after 56 days or after the expiration date, whichever comes first.  Do not store your pen with the needle attached. If the needle is left on, medicine may leak from the pen.  NOTE: This sheet is a summary. It may not cover all possible information. If you have questions about this medicine, talk to your doctor, pharmacist, or health care provider.  © 2018 [employer]vier/Gold Standard (2016-06-12 14:43:35)

## 2018-03-02 NOTE — Telephone Encounter (Signed)
Per pharmacy this price is with her insurance and no PA is needed.

## 2018-03-02 NOTE — Telephone Encounter (Signed)
Is that with insurance coverage? Does it need a PA?

## 2018-03-03 NOTE — Telephone Encounter (Signed)
No coupons are allowed for medicare plans. Like in the previous note she would have to file for medication assistance through the pharmaceutical company. I can get her to see our clinical pharmacist for assistance with completing the application for this.

## 2018-03-03 NOTE — Telephone Encounter (Signed)
Pt called back about her medication Semaglutide, 1 MG/DOSE, (OZEMPIC, 1 MG/DOSE,) 2 MG/1.5ML SOPN. Pt stated she didn't get the medication because she can't pay 200$ out of pocket. Pt asked if we have any coupons. Please advise. Thanks TNP

## 2018-03-03 NOTE — Telephone Encounter (Signed)
Referral placed.

## 2018-03-04 ENCOUNTER — Ambulatory Visit: Payer: Self-pay | Admitting: *Deleted

## 2018-03-04 DIAGNOSIS — E119 Type 2 diabetes mellitus without complications: Secondary | ICD-10-CM | POA: Diagnosis not present

## 2018-03-04 DIAGNOSIS — Z6841 Body Mass Index (BMI) 40.0 and over, adult: Secondary | ICD-10-CM

## 2018-03-04 NOTE — Chronic Care Management (AMB) (Addendum)
  Chronic Care Management   Note  03/04/2018 Name: Alexis Lloyd MRN: [id number] DOB: 1961-03-22  Alexis Lloyd is a 57 year old female primary care patient of Bryson Ha PA-C who was referred to the CCM team for case management support re: DM management and medication management and assistance.  I spoke with Alexis Lloyd by phone today re: needs as outlined below.   Goals    . "I can't afford all this medicine and need help with applying for help" (pt-stated)     Patient states highest priority case management need is medication assistance. She says she is unable to pay for Ozempic because of $200 copay and was also prescribed 4 medications last week at the dermatologist which she says are not covered by her insurance and are too expensive for her to pay for out of pocket. I transferred our call to Ruben Reason PharmD for further assistance.   Clinical Goals: Over the next 14 days, patient will work with [facility] Pharmacist to address patient medication assistance needs and applications.   Interventions: referral to clinic pharmacist      Plan:   Alexis Lloyd will be seen by the CCM team in the office on Wednesday, Oct 2 @ [facility] [phone]

## 2018-03-04 NOTE — Patient Instructions (Signed)
Please bring ALL OF YOUR MEDICATIONS to your appointment. Thank you!  Ms. Bartelt was given information about Chronic Care Management services today including:  1. CCM service includes personalized support from designated clinical staff supervised by her physician, including individualized plan of care and coordination with other care providers 2. 24/7 contact phone numbers for assistance for urgent and routine care needs. 3. Service will only be billed when office clinical staff spend 20 minutes or more in a month to coordinate care. 4. Only one practitioner may furnish and bill the service in a calendar month. 5. The patient may stop CCM services at any time (effective at the end of the month) by phone call to the office staff. 6. The patient will be responsible for cost sharing (co-pay) of up to 20% of the service fee (after annual deductible is met).  Patient agreed to services and verbal consent obtained.  CCM (Chronic Care Management) Team  Alisa Gilboy MHA,BSN,RN,CCM Nurse Care Coordinator  [phone]   Julie Hedrick PharmD  Clinical Pharmacist  [phone]  

## 2018-03-10 ENCOUNTER — Ambulatory Visit: Payer: Medicare HMO | Admitting: Pharmacist

## 2018-03-10 DIAGNOSIS — Z6841 Body Mass Index (BMI) 40.0 and over, adult: Secondary | ICD-10-CM

## 2018-03-10 DIAGNOSIS — E119 Type 2 diabetes mellitus without complications: Secondary | ICD-10-CM

## 2018-03-10 NOTE — Chronic Care Management (AMB) (Signed)
  Chronic Care Management   Initial Visit Note  03/10/2018 Name: ONI DIETZMAN MRN: [id number] DOB: November 26, 1960  Referred by: Mar Daring, PA-C Reason for referral : Chronic Care Management (DM)   Subjective: "I know I have to get this sugar down"  Objective:  Lab Results  Component Value Date   HGBA1C 13.8 (A) 03/02/2018   HGBA1C 9.0 (H) 08/31/2017   HGBA1C 8.3 05/08/2017   Lab Results  Component Value Date   MICROALBUR 50 08/31/2017   LDLCALC 113 (H) 08/31/2017   CREATININE 0.78 02/21/2018    Assessment: Mrs. Mallo is a 57 year old female primary care patient of Fenton Malling PA-C who was referred to the CCM program for assistance with medication management/assistance and DM/HTN disease management. She is being seen in the CCM office for the first time today.   Goals Addressed    . "I need help getting my sugar back down. It has gone way up!" (pt-stated)       Patient presents today eager to make changes necessary to improve her cbg. See HgA1C above. Increased from 9 > 13 since April. Stopped taking Trulicity because of GI upset (N/V) after 2-3 month course. Patient admits that she has been overeating (portions) and has been indulging in sugary fruits in unlimited portions and in desserts from time to time. She is checking her cbg's most days and says they are generally "in the 130's" (inconsistent w/ A1C = 13).   Clinical Goals: Over the next 14 days, patient will report improvement in portion size management, decreased intake of sugary fruits and desserts, and daily cbg check/recording  Interventions: Extensive carb modified diet education provided, advised to check cbg daily and record; follow up call scheduled     Plan: Telephone Outreach over the next 10-14 days to follow up on cbg monitoring and dietary management.    Ms. Turvey was given information about Chronic Care Management services today including:  1. CCM service includes personalized support  from designated clinical staff supervised by her physician, including individualized plan of care and coordination with other care providers 2. 24/7 contact phone numbers for assistance for urgent and routine care needs. 3. Service will only be billed when office clinical staff spend 20 minutes or more in a month to coordinate care. 4. Only one practitioner may furnish and bill the service in a calendar month. 5. The patient may stop CCM services at any time (effective at the end of the month) by phone call to the office staff. 6. The patient will be responsible for cost sharing (co-pay) of up to 20% of the service fee (after annual deductible is met).  Patient agreed to services and verbal consent obtained.  [facility] [phone]

## 2018-03-10 NOTE — Chronic Care Management (AMB) (Signed)
Chronic Care Management   Note  03/10/2018 Name: Alexis Lloyd MRN: [id number] DOB: Mar 19, 1961   Subjective: "I know I have to get this sugar down"  Objective:  Recent Labs       Lab Results  Component Value Date   HGBA1C 13.8 (A) 03/02/2018   HGBA1C 9.0 (H) 08/31/2017   HGBA1C 8.3 05/08/2017     Recent Labs       Lab Results  Component Value Date   MICROALBUR 50 08/31/2017   LDLCALC 113 (H) 08/31/2017   CREATININE 0.78 02/21/2018      Medications Reviewed Today    Reviewed by Cathi Roan, [facility] (Pharmacist) on 03/10/18 at 1032  Med List Status: <None>  Medication Order Taking? Sig Documenting Provider Last Dose Status Informant  albuterol (PROVENTIL HFA) 108 (90 Base) MCG/ACT inhaler [id number] No Inhale 2 puffs every 6 (six) hours as needed into the lungs.  Patient not taking:  Reported on 03/10/2018   Mar Daring, PA-C Not Taking Active   Blood Glucose Monitoring Suppl (ONE TOUCH ULTRA SYSTEM KIT) w/Device KIT [id number] Yes To check blood sugar daily Mar Daring, PA-C Taking Active   cetirizine (ZYRTEC) 10 MG tablet [id number] Yes Take 1 tablet (10 mg total) by mouth daily. Margarita Rana, MD Taking Active   glucose blood (ONE TOUCH ULTRA TEST) test strip [id number] Yes USE TEST STRIP(S) TO CHECK BLLOD SUGAR DAILY Mar Daring, PA-C Taking Active   ketoconazole (NIZORAL) 2 % cream [id number] Yes APPLY CREAM TOPICALLY TWICE DAILY TO FEET [provider] Taking Active   Lancets [facility] ULTRASOFT) lancets [id number] Yes USE LANCET(S)  TO CHECK GLUCOSE DAILY Mar Daring, PA-C Taking Active   lisinopril-hydrochlorothiazide (PRINZIDE,ZESTORETIC) 20-12.5 MG tablet [id number] Yes TAKE 2 TABLETS BY MOUTH ONCE DAILY Mar Daring, PA-C Taking Active   lovastatin (MEVACOR) 20 MG tablet [id number] No Take 2 tablets (40 mg total) by mouth daily.  Patient not taking:  Reported on 03/10/2018   Mar Daring, PA-C Not  Taking Active            Med Note Lemar Lofty, Victor Dec 01, 2017  1:28 PM) Pt stopped taking med because it made her stomach jump.  meloxicam (MOBIC) 15 MG tablet [id number] No Take 1 tablet (15 mg total) by mouth daily.  Patient not taking:  Reported on 03/10/2018   Margarita Rana, MD Not Taking Active   metFORMIN (GLUCOPHAGE) 1000 MG tablet [id number] Yes Take 1 tablet (1,000 mg total) by mouth 2 (two) times daily with a meal. Mar Daring, PA-C Taking Active   mometasone-formoterol (DULERA) 100-5 MCG/ACT AERO [id number] No Inhale 2 puffs into the lungs daily.  Patient not taking:  Reported on 03/10/2018   Margarita Rana, MD Not Taking Active   montelukast (SINGULAIR) 10 MG tablet [id number] Yes TAKE 1 TABLET BY MOUTH ONCE DAILY Mar Daring, PA-C Taking Active   Semaglutide, 1 MG/DOSE, (OZEMPIC, 1 MG/DOSE,) 2 MG/1.5ML SOPN [id number] No Inject 1 mg into the skin once a week.  Patient not taking:  Reported on 03/10/2018   Mar Daring, PA-C Not Taking Active   sitaGLIPtin (JANUVIA) 100 MG tablet [id number] Yes Take 1 tablet (100 mg total) by mouth daily. Mar Daring, PA-C Taking Active   spironolactone (ALDACTONE) 25 MG tablet [id number] Yes TAKE 1 TABLET BY MOUTH ONCE DAILY Mar Daring, PA-C Taking Active   terbinafine (LAMISIL) 250 MG tablet [id number] Yes Take 250 mg by mouth  daily. [provider] Taking Active   triamcinolone cream (KENALOG) 0.1 % [id number] No Apply 1 application topically 2 (two) times daily.  Patient not taking:  Reported on 03/10/2018   Mar Daring, PA-C Not Taking Active   verapamil (CALAN) 80 MG tablet [id number] Yes Take 1 tablet (80 mg total) by mouth 2 (two) times daily. Mar Daring, PA-C Taking Active           Assessment: Alexis Lloyd is a 57 year old female primary care patient of Fenton Malling PA-C who was referred to the CCM program for assistance with medication management/assistance and  DM/HTN disease management. She is being seen in the CCM office for the first time today.   Goals    . "I can't afford all this medicine and need help with applying for help" (pt-stated)     Patient states highest priority case management need is medication assistance. She says she is unable to pay for Ozempic because of $200 copay and was also prescribed 4 medications last week at the dermatologist which she says are not covered by her insurance and are too expensive for her to pay for out of pocket.   Clinical Goals: Over the next 14 days, patient will work with [facility] Pharmacist to address patient medication assistance needs and applications.   Interventions: Apply to Medicare Extra Help, drug manufacturer programs     . "I need help getting my sugar back down. It has gone way up!" (pt-stated)     Patient presents today eager to make changes necessary to improve her cbg. See HgA1C above. Increased from 9 > 13 since April. Stopped taking Trulicity because of GI upset (N/V) after 2-3 month course. Patient admits that she has been overeating (portions) and has been indulging in sugary fruits in unlimited portions and in desserts from time to time. She is checking her cbg's most days and says they are generally "in the 130's" (inconsistent w/ A1C = 13).   Clinical Goals: Over the next 14 days, patient will report improvement in portion size management, decreased intake of sugary fruits and desserts, and daily cbg check/recording  Interventions: Extensive carb modified diet education provided, advised to check cbg daily and record; follow up call scheduled         Drug Therapy Interventions: Adherence:  --Cannot Afford Medication: albuterol (GSK), Dulera (Merck), [employer] (NovoNordisk), Januvia (Merck) --Patient Prefers Not to Take: cholesterol medication  Needs Additional Therapy: --Preventative Therapy: nonadherent to lovastatin  Plan: -- Applied to [employer] Extra Help program,  prepared documents for [employer] medication assistance programs for [employer], [employer], and [employer]. Patient does not meet minimum TROOP for prescriptions for Latimer and NovoNordisk.   -- Counseled patient on the importance of adherence and why cholesterol medicine was necessary for people with diabetes. Will send message to Fenton Malling, patient's primary care provider.   Follow up: Will prepare MAP applications to be faxed. Follow up in 2 weeks regarding status of applications.   Ruben Reason, PharmD Clinical Pharmacist [place] [phone]

## 2018-03-10 NOTE — Patient Instructions (Addendum)
1. Manage your portions! (Especially starchy foods - potatoes, bread, rice) 2. When eating fruit - remember that a serving usually fits in the palm of your hand (ex. 1/2 banana, 10-12 grapes, small apple or 1/2 of large apple) 3. Remember the "plate method" - 1/2 your plate should be colorful vegetables, 1/4 your plate should be protein (chicken, fish, beef), 1/4 your plate should be starch (potatoes, bread, rice) 4. Check your blood sugar every day and write it down.  5. Almyra Free (pharmacist) will return a call to you about medication assistance  6. Delrae Rend will call you over the next 2 weeks to ask about your blood sugar checks and how you are doing with portions and your diet.  7. Call us if you have a question!   CCM (Chronic Care Management) Team    Janalyn Shy [facility] Nurse Care Coordinator  [phone]   Ruben Reason PharmD  Clinical Pharmacist  [phone]   Diabetes Mellitus and Nutrition When you have diabetes (diabetes mellitus), it is very important to have healthy eating habits because your blood sugar (glucose) levels are greatly affected by what you eat and drink. Eating healthy foods in the appropriate amounts, at about the same times every day, can help you:  Control your blood glucose.  Lower your risk of heart disease.  Improve your blood pressure.  Reach or maintain a healthy weight.  Every person with diabetes is different, and each person has different needs for a meal plan. Your health care provider may recommend that you work with a diet and nutrition specialist (dietitian) to make a meal plan that is best for you. Your meal plan may vary depending on factors such as:  The calories you need.  The medicines you take.  Your weight.  Your blood glucose, blood pressure, and cholesterol levels.  Your activity level.  Other health conditions you have, such as heart or kidney disease.  How do carbohydrates affect me? Carbohydrates affect your  blood glucose level more than any other type of food. Eating carbohydrates naturally increases the amount of glucose in your blood. Carbohydrate counting is a method for keeping track of how many carbohydrates you eat. Counting carbohydrates is important to keep your blood glucose at a healthy level, especially if you use insulin or take certain oral diabetes medicines. It is important to know how many carbohydrates you can safely have in each meal. This is different for every person. Your dietitian can help you calculate how many carbohydrates you should have at each meal and for snack. Foods that contain carbohydrates include:  Bread, cereal, rice, pasta, and crackers.  Potatoes and corn.  Peas, beans, and lentils.  Milk and yogurt.  Fruit and juice.  Desserts, such as cakes, cookies, ice cream, and candy.  How does alcohol affect me? Alcohol can cause a sudden decrease in blood glucose (hypoglycemia), especially if you use insulin or take certain oral diabetes medicines. Hypoglycemia can be a life-threatening condition. Symptoms of hypoglycemia (sleepiness, dizziness, and confusion) are similar to symptoms of having too much alcohol. If your health care provider says that alcohol is safe for you, follow these guidelines:  Limit alcohol intake to no more than 1 drink per day for nonpregnant women and 2 drinks per day for men. One drink equals 12 oz of beer, 5 oz of wine, or 1 oz of hard liquor.  Do not drink on an empty stomach.  Keep yourself hydrated with water, diet soda, or unsweetened iced tea.  Keep in mind that regular soda, juice, and other mixers may contain a lot of sugar and must be counted as carbohydrates.  What are tips for following this plan? Reading food labels  Start by checking the serving size on the label. The amount of calories, carbohydrates, fats, and other nutrients listed on the label are based on one serving of the food. Many foods contain more than one  serving per package.  Check the total grams (g) of carbohydrates in one serving. You can calculate the number of servings of carbohydrates in one serving by dividing the total carbohydrates by 15. For example, if a food has 30 g of total carbohydrates, it would be equal to 2 servings of carbohydrates.  Check the number of grams (g) of saturated and trans fats in one serving. Choose foods that have low or no amount of these fats.  Check the number of milligrams (mg) of sodium in one serving. Most people should limit total sodium intake to less than 2,300 mg per day.  Always check the nutrition information of foods labeled as "low-fat" or "nonfat". These foods may be higher in added sugar or refined carbohydrates and should be avoided.  Talk to your dietitian to identify your daily goals for nutrients listed on the label. Shopping  Avoid buying canned, premade, or processed foods. These foods tend to be high in fat, sodium, and added sugar.  Shop around the outside edge of the grocery store. This includes fresh fruits and vegetables, bulk grains, fresh meats, and fresh dairy. Cooking  Use low-heat cooking methods, such as baking, instead of high-heat cooking methods like deep frying.  Cook using healthy oils, such as olive, canola, or sunflower oil.  Avoid cooking with butter, cream, or high-fat meats. Meal planning  Eat meals and snacks regularly, preferably at the same times every day. Avoid going long periods of time without eating.  Eat foods high in fiber, such as fresh fruits, vegetables, beans, and whole grains. Talk to your dietitian about how many servings of carbohydrates you can eat at each meal.  Eat 4-6 ounces of lean protein each day, such as lean meat, chicken, fish, eggs, or tofu. 1 ounce is equal to 1 ounce of meat, chicken, or fish, 1 egg, or 1/4 cup of tofu.  Eat some foods each day that contain healthy fats, such as avocado, nuts, seeds, and  fish. Lifestyle   Check your blood glucose regularly.  Exercise at least 30 minutes 5 or more days each week, or as told by your health care provider.  Take medicines as told by your health care provider.  Do not use any products that contain nicotine or tobacco, such as cigarettes and e-cigarettes. If you need help quitting, ask your health care provider.  Work with a Social worker or diabetes educator to identify strategies to manage stress and any emotional and social challenges. What are some questions to ask my health care provider?  Do I need to meet with a diabetes educator?  Do I need to meet with a dietitian?  What number can I call if I have questions?  When are the best times to check my blood glucose? Where to find more information:  American Diabetes Association: diabetes.org/food-and-fitness/food  Academy of Nutrition and Dietetics: PokerClues.dk  [employer] of Diabetes and Digestive and Kidney Diseases (NIH): ContactWire.be Summary  A healthy meal plan will help you control your blood glucose and maintain a healthy lifestyle.  Working with a diet and nutrition specialist (  dietitian) can help you make a meal plan that is best for you.  Keep in mind that carbohydrates and alcohol have immediate effects on your blood glucose levels. It is important to count carbohydrates and to use alcohol carefully. This information is not intended to replace advice given to you by your health care provider. Make sure you discuss any questions you have with your health care provider. Document Released: 02/20/2005 Document Revised: 06/30/2016 Document Reviewed: 06/30/2016 Elsevier Interactive Patient Education  2018 [employer].    Ms. Weyandt was given information about Chronic Care Management services today including:  1. CCM service includes  personalized support from designated clinical staff supervised by her physician, including individualized plan of care and coordination with other care providers 2. 24/7 contact phone numbers for assistance for urgent and routine care needs. 3. Service will only be billed when office clinical staff spend 20 minutes or more in a month to coordinate care. 4. Only one practitioner may furnish and bill the service in a calendar month. 5. The patient may stop CCM services at any time (effective at the end of the month) by phone call to the office staff. 6. The patient will be responsible for cost sharing (co-pay) of up to 20% of the service fee (after annual deductible is met).  Patient agreed to services and verbal consent obtained.

## 2018-03-22 ENCOUNTER — Telehealth: Payer: Self-pay

## 2018-03-22 ENCOUNTER — Ambulatory Visit: Payer: Self-pay

## 2018-03-23 ENCOUNTER — Ambulatory Visit: Payer: Self-pay

## 2018-03-23 DIAGNOSIS — Z6841 Body Mass Index (BMI) 40.0 and over, adult: Secondary | ICD-10-CM

## 2018-03-23 DIAGNOSIS — E119 Type 2 diabetes mellitus without complications: Secondary | ICD-10-CM

## 2018-03-23 NOTE — Chronic Care Management (AMB) (Signed)
  Chronic Care Management   Note  03/23/2018 Name: MAUDY YONAN MRN: [id number] DOB: Apr 13, 1961  I reached out to Ms. Mariea Clonts by phone today and reached her but she said she couldn't talk and asked if she could return a call to me within 10 minutes. I did not receive a return call by day's end.   Plan: I will reach out to Ms. Moskal again over the next week.   [facility] Care Management [phone]

## 2018-03-24 ENCOUNTER — Ambulatory Visit: Payer: Self-pay | Admitting: *Deleted

## 2018-03-24 ENCOUNTER — Telehealth: Payer: Self-pay | Admitting: *Deleted

## 2018-03-24 DIAGNOSIS — E119 Type 2 diabetes mellitus without complications: Secondary | ICD-10-CM

## 2018-03-24 DIAGNOSIS — I10 Essential (primary) hypertension: Secondary | ICD-10-CM

## 2018-03-24 NOTE — Progress Notes (Signed)
  Chronic Care Management   Note  03/24/2018 Name: Alexis Lloyd MRN: [id number] DOB: [birth year]  Unsuccessful return call to [name] today. She returned my call after she was unable to speak yesterday.   Plan: I will reach out to [name] again tomorrow.   [facility] Care Management [phone]

## 2018-03-24 NOTE — Patient Instructions (Signed)
Ms. Wailes was given information about Chronic Care Management services today including:  1. CCM service includes personalized support from designated clinical staff supervised by her physician, including individualized plan of care and coordination with other care providers 2. 24/7 contact phone numbers for assistance for urgent and routine care needs. 3. Service will only be billed when office clinical staff spend 20 minutes or more in a month to coordinate care. 4. Only one practitioner may furnish and bill the service in a calendar month. 5. The patient may stop CCM services at any time (effective at the end of the month) by phone call to the office staff. 6. The patient will be responsible for cost sharing (co-pay) of up to 20% of the service fee (after annual deductible is met).  Patient agreed to services and verbal consent obtained.

## 2018-03-24 NOTE — Telephone Encounter (Signed)
Thank you. I'll call Ms. Viglione this morning.

## 2018-03-24 NOTE — Telephone Encounter (Signed)
Pt stated that she was returning a call from the RN with care management from yesterday 03/23/18. Pt is requesting call back. Please advise. Thanks TNP

## 2018-03-25 ENCOUNTER — Ambulatory Visit: Payer: Self-pay | Admitting: *Deleted

## 2018-03-25 DIAGNOSIS — L2084 Intrinsic (allergic) eczema: Secondary | ICD-10-CM

## 2018-03-25 DIAGNOSIS — I10 Essential (primary) hypertension: Secondary | ICD-10-CM

## 2018-03-25 DIAGNOSIS — R12 Heartburn: Secondary | ICD-10-CM

## 2018-03-25 DIAGNOSIS — E119 Type 2 diabetes mellitus without complications: Secondary | ICD-10-CM

## 2018-03-25 NOTE — Patient Instructions (Addendum)
1. Begin topical treatment for skin rash 2. Await follow up call re: palpitations  CCM (Chronic Care Management) Team   Trish Fountain RN, BSN Nurse Care Coordinator  [phone]  Ruben Reason PharmD  Clinical Pharmacist  [phone]

## 2018-03-25 NOTE — Telephone Encounter (Signed)
Patient called stating she is having palpitations and feels it may be from Lamisil she is on, but I am not sure where she gets this from.   Could we call her and see what she is feeling? If symptomatic (chest pain, SOB, leg swelling) enough she needs to be seen.

## 2018-03-25 NOTE — Chronic Care Management (AMB) (Signed)
  Chronic Care Management   Follow Up Note   03/25/2018 Name: CAMYRA VAETH MRN: [id number] DOB: [birth year]  Referred by: Mar Daring, PA-C Reason for referral : Chronic Care Management  Subjective: "I'm having palpitations"  Objective: Pulse Readings from Last 3 Encounters:  03/02/18 75  02/21/18 70  01/22/18 95     BP Readings from Last 3 Encounters:  03/02/18 116/74  02/21/18 139/65  01/22/18 110/70   Lab Results  Component Value Date   HGBA1C 13.8 (A) 03/02/2018   HGBA1C 9.0 (H) 08/31/2017   HGBA1C 8.3 05/08/2017   Lab Results  Component Value Date   MICROALBUR 50 08/31/2017   LDLCALC 113 (H) 08/31/2017   CREATININE 0.78 02/21/2018    Assessment: Mrs. Mcnelly is a 57 year old female primary care patient of Fenton Malling PA-C who was referred to the CCM program for assistance with medication management/assistance and DM/HTN disease management. She is being seen in the CCM office for the first time today.  (brief patient description)  Goals Addressed    . "I need help with this rash" (pt-stated)       Patient reports she received a Eucrisa sample from her dermatologist for treatment of a rash on her arms. She is to start the [place] today.   Clinical Goal(s): Over the next 7 days, patient will provide update to CCM team about rash and Eucrisa treatment outcome  Interventions: Advised to use medication as prescribed     . "My stomach jumps and my heart starts racing. I need help with that" (pt-stated)       Call received from Ms. Corvin today who reports that she feels starting terbinafine she has experienced episode wherein her stomach "flips then the palpitations start". She is asking for assistance with determining the cause of her symptoms and would like the CCM team also to advise about her rash (see other goal statement).   Clinical Goal(s): Over the next 24 hours, patient will verbalize understanding of plan of care to address  palpitations  Interventions: Consultation with [facility] Pharmacist and PCP     Plan:  Telephone follow up with patient in the next 7 days   [facility] Care Management [phone]

## 2018-03-26 ENCOUNTER — Telehealth: Payer: Self-pay | Admitting: Physician Assistant

## 2018-03-26 DIAGNOSIS — Z8719 Personal history of other diseases of the digestive system: Secondary | ICD-10-CM

## 2018-03-26 NOTE — Telephone Encounter (Signed)
Referral to [facility] placed

## 2018-03-26 NOTE — Telephone Encounter (Signed)
Pt is requesting a referral for pt to go to [facility]. Pt stated they just need to go to the dentist and that office requires a referral. Please advise. Thanks TNP

## 2018-03-26 NOTE — Telephone Encounter (Signed)
LMTCB following up on her palpitation and regarding the medicine. Patient spoke with Roland Rack yesterday but Provider wants to know where patient or who prescribed the Lamisil.

## 2018-03-29 ENCOUNTER — Ambulatory Visit: Payer: Self-pay

## 2018-03-29 NOTE — Chronic Care Management (AMB) (Signed)
  Chronic Care Management   Note  03/29/2018 Name: SELITA STAIGER MRN: [id number] DOB: July 10, 1960   Unable to reach Ms. Brandstetter today to follow up on conversation last week re: her complaint of palpitations and rash.  I left a message requesting a return call.  Plan: I will follow up with Ms. Mariea Clonts by phone over the next week.   [facility] Care Management [phone]

## 2018-03-30 ENCOUNTER — Ambulatory Visit (INDEPENDENT_AMBULATORY_CARE_PROVIDER_SITE_OTHER): Payer: Medicare HMO | Admitting: Pharmacist

## 2018-03-30 DIAGNOSIS — R12 Heartburn: Secondary | ICD-10-CM

## 2018-03-30 DIAGNOSIS — E119 Type 2 diabetes mellitus without complications: Secondary | ICD-10-CM

## 2018-03-30 DIAGNOSIS — L2084 Intrinsic (allergic) eczema: Secondary | ICD-10-CM

## 2018-03-30 NOTE — Chronic Care Management (AMB) (Signed)
  Chronic Care Management   Note  03/30/2018 Name: Alexis Lloyd MRN: [id number] DOB: 1960-10-20    Pharmacy Medication Therapy Review CC: "My stomach is doing flips when I take the terbinafine"  Does the patient  feel that his/her medications are working for him/her?  yes  Has the patient been experiencing any side effects to the medications prescribed?  yes  Does the patient measure his/her own blood pressure or blood glucose at home?  yes   Does the patient have any problems obtaining medications due to transportation or finances?   yes  Understanding of regimen: excellent Understanding of indications: good Potential of compliance: good    Assessment: (Medication related problems)  Intervention  YES NO  Explanation  Efficacy      Suboptimal drug or dose selection []  [x]    Insufficient dose/duration []  [x]    Failure to receive therapy  (Rx not filled) [x]  []  Eucrisa and "other medications from dermatologist" due to cost   Safety      Adverse drug event [x]  []  Patient is attributing "stomach flip" similar to when she had H.pylori to when she takes terbinafine. Denies palpitations and reiterates the flipping feeling in her stomach. No pain, just an unusual sensation. Was relieved when she took a Benadryl before eating over the weekend.   Other pertinent pharmacist counseling [x]  []  Blood sugars in the morning have been in the 120s. Reports portion control and being selective about what carbs she eats.     Time:  Time spent counseling patient: 12 Additional time spent on charting (specify): 4   PLAN:  Consider the following recommendations discussed with provider - Add ranitidine 75mg  before taking terbinafine daily for the rest of the terbinafine course. Patient agreeable to this change after reporting that Benadryl worked once over the weekend.   - Change terbinafine oral to topical for better adherence/tolerance/drug interaction. Patient declined, stating she had  such a short course of terbinafine left. Terbinafine prescribed by dermatologist for fungal infection   -Patient reports better BG control. Encouraged her to keep up the good work!   -Patient reports improvement in rash on her arms with the Eucrisa sample provided by her dermatologist.   Follow up in 1 week via telephone.    Ruben Reason, PharmD Clinical Pharmacist [place] [phone]

## 2018-03-31 DIAGNOSIS — M1712 Unilateral primary osteoarthritis, left knee: Secondary | ICD-10-CM | POA: Diagnosis not present

## 2018-04-05 ENCOUNTER — Telehealth: Payer: Self-pay

## 2018-04-05 DIAGNOSIS — L821 Other seborrheic keratosis: Secondary | ICD-10-CM | POA: Diagnosis not present

## 2018-04-05 DIAGNOSIS — B353 Tinea pedis: Secondary | ICD-10-CM | POA: Diagnosis not present

## 2018-04-05 DIAGNOSIS — R21 Rash and other nonspecific skin eruption: Secondary | ICD-10-CM | POA: Diagnosis not present

## 2018-04-05 IMAGING — CR DG ABDOMEN 2V
1 series · 3 of 3 positions shown · non-contrast
Comparison: None.

CLINICAL DATA: Abdominal pain, bloating and generalized tenderness
x3 days. History of cholecystectomy and.

EXAM:
ABDOMEN - 2 VIEW

[Series 1: dg abd 2 views · 0.14mm/px · 3 of 3 slices shown]
[im 1/3]
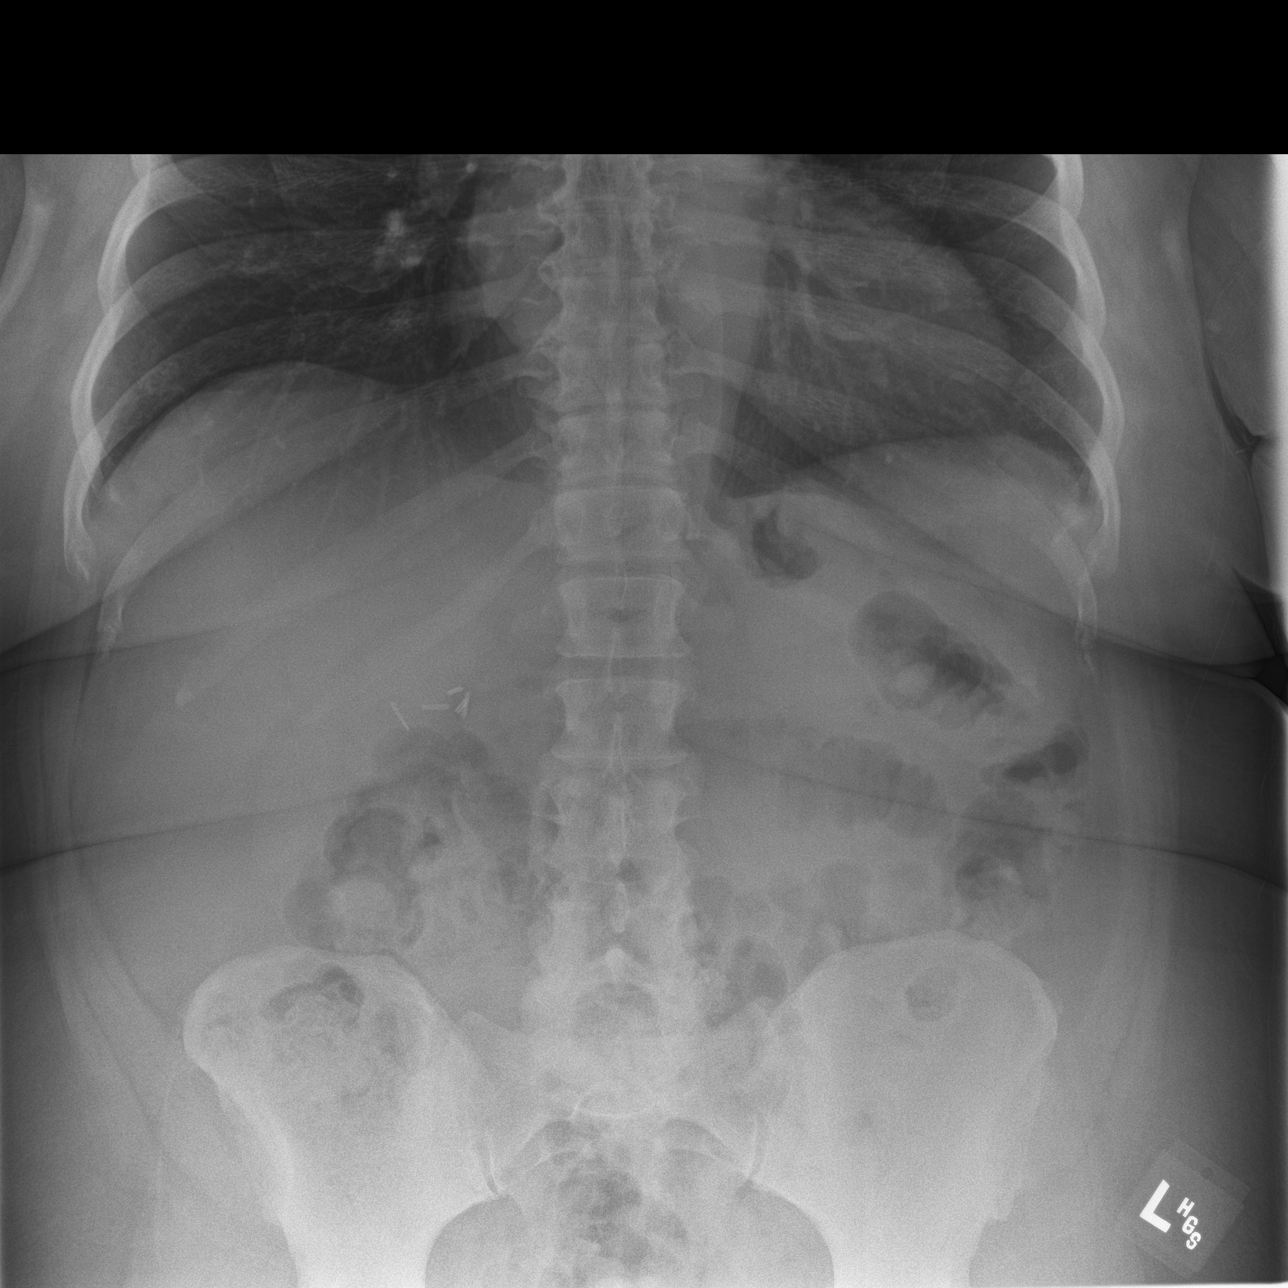
[im 2/3]
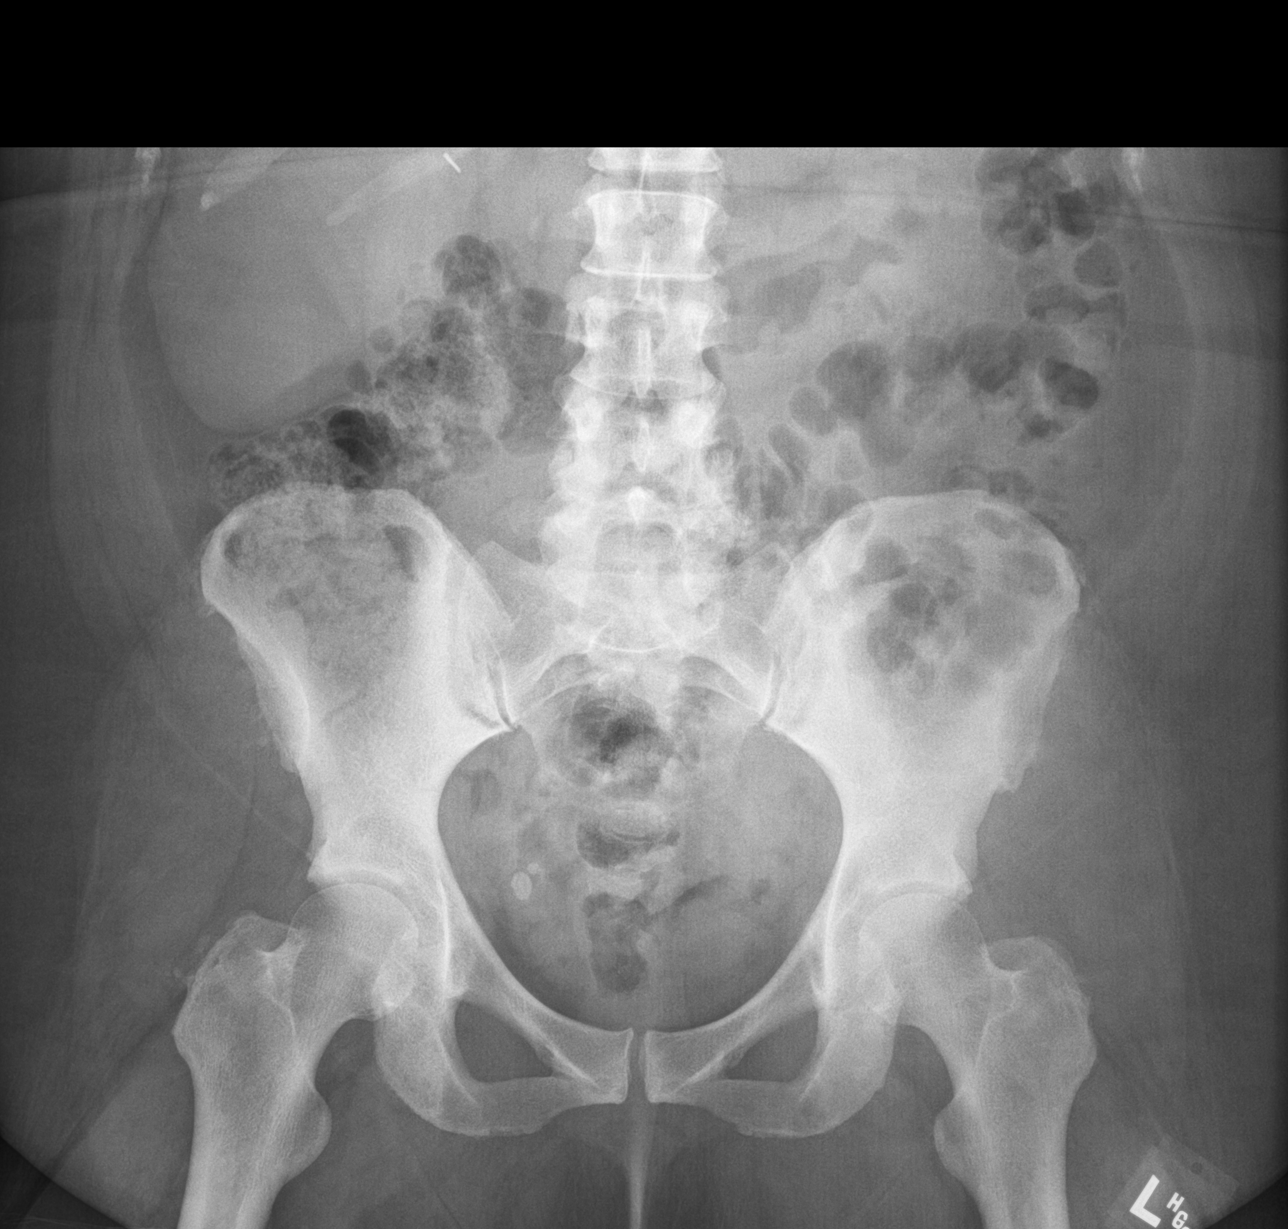
[im 3/3]
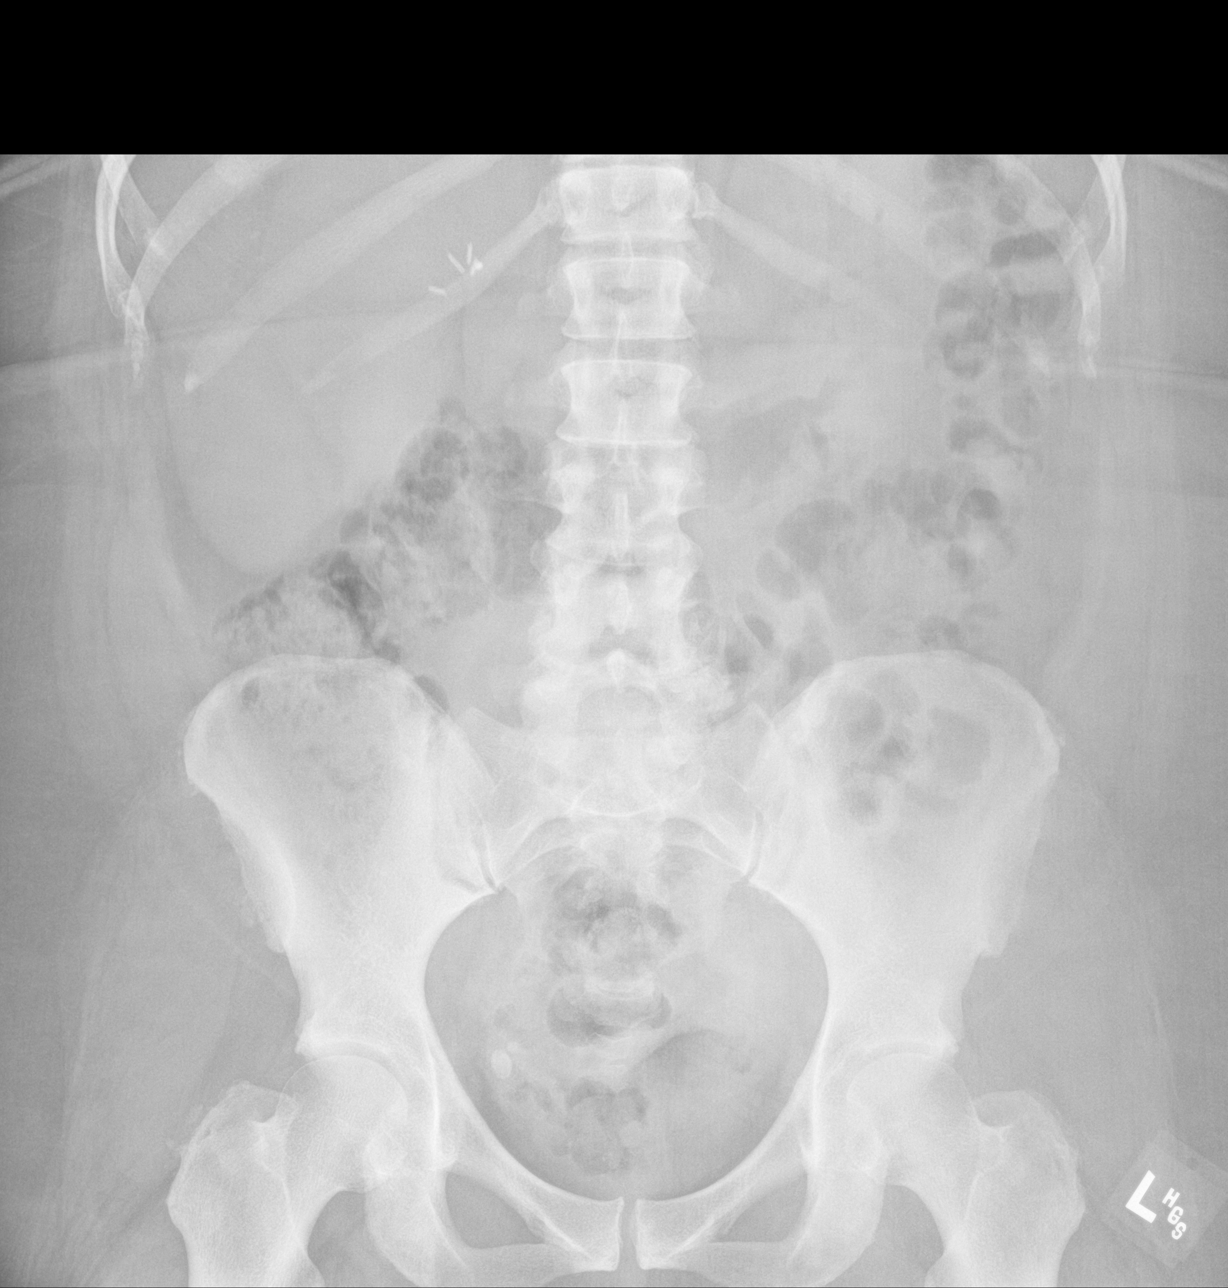

[3 of 3 positions shown; findings below may reference images not displayed]

FINDINGS: Moderate amount of fecal residue along the ascending and proximal
transverse colon. No bowel obstruction. Cholecystectomy clips in the
right upper quadrant. Two calcifications the right hemipelvis likely
represent phleboliths. There is lower lumbar degenerative facet
arthropathy and disc disease. The hips, SI joints and pubic
symphysis appear intact. Calcified densities adjacent to the greater
trochanters may reflect gluteal tendinopathy.
IMPRESSION: Moderate stool burden in distal ascending and proximal transverse
colon. No bowel obstruction. Cholecystectomy. Lower lumbar
degenerative disc and facet arthropathy.

## 2018-04-07 ENCOUNTER — Telehealth: Payer: Self-pay | Admitting: Physician Assistant

## 2018-04-07 ENCOUNTER — Telehealth: Payer: Self-pay | Admitting: Pharmacist

## 2018-04-07 DIAGNOSIS — K0889 Other specified disorders of teeth and supporting structures: Secondary | ICD-10-CM

## 2018-04-07 NOTE — Telephone Encounter (Signed)
Pt checking on the referral she requested for the [facility].  NOT the school but the clinic.  Please advise.  Thanks, [employer]

## 2018-04-12 NOTE — Telephone Encounter (Signed)
Referral resent  

## 2018-04-12 NOTE — Telephone Encounter (Signed)
Please Review

## 2018-04-13 NOTE — Progress Notes (Deleted)
Patient: Alexis Lloyd Female    DOB: Dec 04, 1960   57 y.o.   MRN: [id number] Visit Date: 04/13/2018  Today's Provider: Mar Daring, PA-C   No chief complaint on file.  Subjective:    HPI     Allergies  Allergen Reactions  . Allegra [Fexofenadine] Nausea And Vomiting  . Avelox [Moxifloxacin] Other (See Comments)    Hallucinations  . Bactrim [Sulfamethoxazole-Trimethoprim] Other (See Comments)    unknown  . Etodolac Nausea Only  . Fish-Derived Products Nausea And Vomiting    With "seafood"  . Penicillins Diarrhea and Nausea And Vomiting  . Shellfish Allergy Nausea Only  . Sulfa Antibiotics Other (See Comments)    unknown  . Macrobid [employer] Macro] Other (See Comments)    Constipation and globus hystericus.     Current Outpatient Medications:  .  albuterol (PROVENTIL HFA) 108 (90 Base) MCG/ACT inhaler, Inhale 2 puffs every 6 (six) hours as needed into the lungs. (Patient not taking: Reported on 03/10/2018), Disp: 3 Inhaler, Rfl: 1 .  Blood Glucose Monitoring Suppl (ONE TOUCH ULTRA SYSTEM KIT) w/Device KIT, To check blood sugar daily, Disp: 1 each, Rfl: 0 .  cetirizine (ZYRTEC) 10 MG tablet, Take 1 tablet (10 mg total) by mouth daily., Disp: 90 tablet, Rfl: 1 .  glucose blood (ONE TOUCH ULTRA TEST) test strip, USE TEST STRIP(S) TO CHECK BLLOD SUGAR DAILY, Disp: 100 each, Rfl: 12 .  ketoconazole (NIZORAL) 2 % cream, APPLY CREAM TOPICALLY TWICE DAILY TO FEET, Disp: , Rfl: 2 .  Lancets (ONETOUCH ULTRASOFT) lancets, USE LANCET(S)  TO CHECK GLUCOSE DAILY, Disp: 100 each, Rfl: 12 .  lisinopril-hydrochlorothiazide (PRINZIDE,ZESTORETIC) 20-12.5 MG tablet, TAKE 2 TABLETS BY MOUTH ONCE DAILY, Disp: 180 tablet, Rfl: 1 .  lovastatin (MEVACOR) 20 MG tablet, Take 2 tablets (40 mg total) by mouth daily. (Patient not taking: Reported on 03/10/2018), Disp: 180 tablet, Rfl: 1 .  meloxicam (MOBIC) 15 MG tablet, Take 1 tablet (15 mg total) by mouth daily. (Patient  not taking: Reported on 03/10/2018), Disp: 90 tablet, Rfl: 1 .  metFORMIN (GLUCOPHAGE) 1000 MG tablet, Take 1 tablet (1,000 mg total) by mouth 2 (two) times daily with a meal., Disp: 180 tablet, Rfl: 1 .  mometasone-formoterol (DULERA) 100-5 MCG/ACT AERO, Inhale 2 puffs into the lungs daily. (Patient not taking: Reported on 03/10/2018), Disp: 39 g, Rfl: 1 .  montelukast (SINGULAIR) 10 MG tablet, TAKE 1 TABLET BY MOUTH ONCE DAILY, Disp: 30 tablet, Rfl: 11 .  Semaglutide, 1 MG/DOSE, (OZEMPIC, 1 MG/DOSE,) 2 MG/1.5ML SOPN, Inject 1 mg into the skin once a week. (Patient not taking: Reported on 03/10/2018), Disp: 3 pen, Rfl: 5 .  sitaGLIPtin (JANUVIA) 100 MG tablet, Take 1 tablet (100 mg total) by mouth daily., Disp: 90 tablet, Rfl: 1 .  spironolactone (ALDACTONE) 25 MG tablet, TAKE 1 TABLET BY MOUTH ONCE DAILY, Disp: 90 tablet, Rfl: 1 .  terbinafine (LAMISIL) 250 MG tablet, Take 250 mg by mouth daily., Disp: , Rfl: 0 .  triamcinolone cream (KENALOG) 0.1 %, Apply 1 application topically 2 (two) times daily. (Patient not taking: Reported on 03/10/2018), Disp: 80 g, Rfl: 0 .  verapamil (CALAN) 80 MG tablet, Take 1 tablet (80 mg total) by mouth 2 (two) times daily., Disp: 180 tablet, Rfl: 1  Review of Systems  Social History   Tobacco Use  . Smoking status: Former Smoker    Packs/day: 0.25    Years: 21.00    Pack years:  5.25    Last attempt to quit: 06/08/1997    Years since quitting: 20.8  . Smokeless tobacco: Never Used  Substance Use Topics  . Alcohol use: No   Objective:   There were no vitals taken for this visit. There were no vitals filed for this visit.   Physical Exam      Assessment & Plan:           Mar Daring, PA-C  [facility]

## 2018-04-14 ENCOUNTER — Ambulatory Visit: Payer: Medicare HMO | Admitting: Physician Assistant

## 2018-04-21 ENCOUNTER — Ambulatory Visit: Payer: Self-pay

## 2018-04-21 DIAGNOSIS — I10 Essential (primary) hypertension: Secondary | ICD-10-CM

## 2018-04-21 DIAGNOSIS — E119 Type 2 diabetes mellitus without complications: Secondary | ICD-10-CM

## 2018-04-21 DIAGNOSIS — E7800 Pure hypercholesterolemia, unspecified: Secondary | ICD-10-CM

## 2018-04-21 NOTE — Patient Instructions (Signed)
1. Continue to monitor your BP and blood glucose daily and record 2. Continue to take all medications as prescribed 3. You are doing a great job with your meal portion control!! Keep up the good work! 4. Add a little exercise as tolerated into your daily activity 5. Contact your CCM Team with any questions/concerns/needs  CCM (Chronic Care Management) Team   Trish Fountain RN, BSN Nurse Care Coordinator  [phone]  Ruben Reason PharmD  Clinical Pharmacist  [phone]  The patient verbalized understanding of instructions provided today and declined a print copy of patient instruction materials.

## 2018-04-21 NOTE — Chronic Care Management (AMB) (Signed)
  Chronic Care Management   Follow Up Note   04/21/2018 Name: DEEDEE LYBARGER MRN: [id number] DOB: 1961/05/14  Referred by: Mar Daring, PA-C Reason for referral : Chronic Care Management (DM)    Subjective: "I am doing all I can to make sure my A1C goes back down"   Objective:  Lab Results  Component Value Date   HGBA1C 13.8 (A) 03/02/2018   BP Readings from Last 3 Encounters:  03/02/18 116/74  02/21/18 139/65  01/22/18 110/70     Assessment:  Mrs. Holloran is a 57 year old female primary care patient of Fenton Malling PA-C who was referred to the CCM program for assistance with medication management/assistance and DM/HTN disease management. Follow up phone call today to address progression towards established goals.  Goals Addressed    . COMPLETED: "I can't afford all this medicine and need help with applying for help" (pt-stated)       Patient states she has received a letter from [employer] last week stating she and husband's incomes exceed qualifications for assistance with her [place].She continues to take all other medications as prescribed. Will notify CCM Clinical pharmacist.  Clinical Goals: Over the next 14 days, patient will work with [facility] Pharmacist to address patient medication assistance needs and applications.   Interventions: referral to clinic pharmacist      . "I need help getting my sugar back down. It has gone way up!" (pt-stated)       Patient admits to doing very well with her portion control, abstinence from excessive intake of sugary fruits and treats, and checking her blood sugars. She is very motivated to lowering her A1C by January 2020. She reports fasting blood glucose reading between 130s-140s which is a significant improvement.   Clinical Goals: Over the next 3 weeks, patient will continue to report adherence to DM self management plan including improvement in portion size management, decreased intake of sugary fruits and  desserts, and daily cbg check/recording  Interventions: Offered emotional support and commended patient on commitment to improving her A1C through lifestyle changes. Reinforced DM education previously given including portion size management, decreased intake of sugary fruits and desserts, and checking/recording blood glucose daily.     . COMPLETED: "I need help with this rash" (pt-stated)       Patient states her rash is much improved and she has been given samples of Eucrisa to use PRN flares. She reports not having to return to her dermatologist for follow up, only as needed.  Clinical Goal(s): Over the next 7 days, patient will provide update to CCM team about rash and Eucrisa treatment outcome  Interventions: Advised to use medication as prescribed     . COMPLETED: "My stomach jumps and my heart starts racing. I need help with that" (pt-stated)       Patient reports she consulted with her dermatologist regarding her "stomach flipping" after she had initiated treatment of a foot infection with oral Lamisil. Her dermatologist discontinued the oral Lamisil and prescribed oral fluconazole. Patient states her GI symptoms immediately resolved and has not returned.  Clinical Goal(s): Over the next 24 hours, patient will verbalize understanding of plan of care to address palpitations  Interventions: Consultation with [facility] Pharmacist and PCP      Plan: CCM Team will follow up with patient via telephone in 3 weeks   Midori Dado E. Rollene Rotunda, RN, BSN Nurse Care Coordinator [facility] Care Management [phone]

## 2018-04-27 DIAGNOSIS — R69 Illness, unspecified: Secondary | ICD-10-CM | POA: Diagnosis not present

## 2018-05-12 ENCOUNTER — Ambulatory Visit: Payer: Self-pay

## 2018-05-12 DIAGNOSIS — E119 Type 2 diabetes mellitus without complications: Secondary | ICD-10-CM

## 2018-05-12 DIAGNOSIS — E7800 Pure hypercholesterolemia, unspecified: Secondary | ICD-10-CM

## 2018-05-12 NOTE — Patient Instructions (Addendum)
1. Continue to monitor your blood sugar daily 2. Continue to take all medications as prescribed 3. You are doing a great job with your meal portion control!! Keep up the good work! 4. Continue to add a little exercise as tolerated into your daily activity (park further away from store/work, take stairs instead of elevators) 5. Contact your CCM Team with any questions/concerns/needs  CCM (Chronic Care Management) Team   Trish Fountain RN, BSN Nurse Care Coordinator  [phone]  Ruben Reason PharmD  Clinical Pharmacist  [phone]  The patient verbalized understanding of instructions provided today and declined a print copy of patient instruction materials.

## 2018-05-12 NOTE — Chronic Care Management (AMB) (Signed)
  Chronic Care Management   Follow Up Note   05/12/2018 Name: Alexis Lloyd MRN: [id number] DOB: 12/15/1960  Referred by: Mar Daring, PA-C Reason for referral : Chronic Care Management (DM follow up)    Subjective: "I think I am still doing pretty good with managing my sugars. I hope Thanksgiving didn't mess me up"   Objective:  Lab Results  Component Value Date   HGBA1C 13.8 (A) 03/02/2018   BP Readings from Last 3 Encounters:  03/02/18 116/74  02/21/18 139/65  01/22/18 110/70     Assessment: Alexis Lloyd is a 56 year old female primary care patient of Fenton Malling PA-C who was referred to the CCM program for assistance with medication management/assistance and DM/HTN disease management. Follow up phone call today to address continued success with established goals.  Goals Addressed    . "I need help getting my sugar back down. It has gone way up!" (pt-stated)       Patient states she continues to work hard at maintaining a more healthy eating habit. She continues to be determined to lower her A1C by January 2020. She states her morning fasting CBGs are in the 130s which is much improved. She reports incorporating a lot of walking into her daily activities/work. She does have a tendency to skip meals occasionally.  Clinical Goals: Over the next 30 days, patient will continue to report adherence to DM self management plan including portion size management, decreased intake of sugary fruits and desserts, and daily cbg check/recording as well as utilizing healthy "snacks" if she does not have time for a meal.  Interventions: Offered emotional support and commended patient on commitment to improving her A1C through lifestyle changes. Reinforced DM education previously given including portion size management, decreased intake of sugary fruits and desserts, and checking/recording blood glucose daily. Encouraged patient to grab healthy snacks she could eat on the "go" if  she did not have time to eat a meal.      Plan: Will follow up with patient in 30 days  Everton Bertha E. Rollene Rotunda, RN, BSN Nurse Care Coordinator [facility] Care Management [phone]

## 2018-05-13 ENCOUNTER — Telehealth: Payer: Self-pay | Admitting: Physician Assistant

## 2018-05-13 NOTE — Telephone Encounter (Signed)
Alexis Lloyd is needing to know what antibiotics pt can take.  Needing to prescribe to pt. Please call :[phone]  Thanks, [facility]

## 2018-05-13 NOTE — Telephone Encounter (Signed)
Please review. Thanks!  

## 2018-05-14 NOTE — Telephone Encounter (Signed)
advised as below.

## 2018-05-14 NOTE — Telephone Encounter (Signed)
She can take clindamycin

## 2018-05-18 ENCOUNTER — Other Ambulatory Visit: Payer: Self-pay | Admitting: Physician Assistant

## 2018-05-18 DIAGNOSIS — E1140 Type 2 diabetes mellitus with diabetic neuropathy, unspecified: Secondary | ICD-10-CM

## 2018-05-18 DIAGNOSIS — IMO0002 Reserved for concepts with insufficient information to code with codable children: Secondary | ICD-10-CM

## 2018-05-18 DIAGNOSIS — E1165 Type 2 diabetes mellitus with hyperglycemia: Secondary | ICD-10-CM

## 2018-05-18 NOTE — Telephone Encounter (Signed)
Pt needing a refill on:  sitaGLIPtin (JANUVIA) 100 MG tablet   Please refill at:  [facility] [phone]

## 2018-05-19 ENCOUNTER — Encounter: Payer: Self-pay | Admitting: Physician Assistant

## 2018-05-19 ENCOUNTER — Ambulatory Visit (INDEPENDENT_AMBULATORY_CARE_PROVIDER_SITE_OTHER): Payer: Medicare HMO | Admitting: Physician Assistant

## 2018-05-19 VITALS — BP 163/82 | HR 80 | Temp 98.0°F | Resp 16 | Wt 215.0 lb

## 2018-05-19 DIAGNOSIS — R112 Nausea with vomiting, unspecified: Secondary | ICD-10-CM | POA: Diagnosis not present

## 2018-05-19 DIAGNOSIS — K529 Noninfective gastroenteritis and colitis, unspecified: Secondary | ICD-10-CM

## 2018-05-19 DIAGNOSIS — R1013 Epigastric pain: Secondary | ICD-10-CM | POA: Diagnosis not present

## 2018-05-19 DIAGNOSIS — R197 Diarrhea, unspecified: Secondary | ICD-10-CM | POA: Diagnosis not present

## 2018-05-19 MED ORDER — LOPERAMIDE HCL 2 MG PO TABS: 2.0000 mg | ORAL_TABLET | Freq: Four times a day (QID) | ORAL | 0 refills | Status: DC | PRN

## 2018-05-19 MED ORDER — SITAGLIPTIN PHOSPHATE 100 MG PO TABS: 100.0000 mg | ORAL_TABLET | Freq: Every day | ORAL | 1 refills | Status: DC

## 2018-05-19 MED ORDER — ONDANSETRON HCL 4 MG PO TABS: 4.0000 mg | ORAL_TABLET | Freq: Three times a day (TID) | ORAL | 0 refills | Status: DC | PRN

## 2018-05-19 NOTE — Progress Notes (Signed)
Patient: Alexis Lloyd Female    DOB: Jan 28, 1961   57 y.o.   MRN: [id number] Visit Date: 05/19/2018  Today's Provider: Mar Daring, PA-C   Chief Complaint  Patient presents with  . Abdominal Pain   Subjective:    HPI Abdominal Pain: Patient complains of abdominal pain. The pain is described as sore, and is 2/10 in intensity. Pain is located in the diffusely without radiation. Onset was 2 days ago. Symptoms have been unchanged since. Aggravating factors: eating.  Alleviating factors: none. Associated symptoms: diarrhea and vomiting. The patient denies fever. Diarrhea occurs every time she eats, reports it as watery and some mucous. No melena or hematochezia. Vomiting started a t 1am yesterday morning. Has had about 5 episodes of vomiting. No food material, just stomach bile. Reports she is hoarse from vomiting and food does not taste right. Has been eating salads due to being diabetic and trying to be healthier, unsure of romaine intake.      Allergies  Allergen Reactions  . Allegra [Fexofenadine] Nausea And Vomiting  . Avelox [Moxifloxacin] Other (See Comments)    Hallucinations  . Bactrim [Sulfamethoxazole-Trimethoprim] Other (See Comments)    unknown  . Etodolac Nausea Only  . Fish-Derived Products Nausea And Vomiting    With "seafood"  . Penicillins Diarrhea and Nausea And Vomiting  . Shellfish Allergy Nausea Only  . Sulfa Antibiotics Other (See Comments)    unknown  . Macrobid [employer] Macro] Other (See Comments)    Constipation and globus hystericus.     Current Outpatient Medications:  .  Blood Glucose Monitoring Suppl (ONE TOUCH ULTRA SYSTEM KIT) w/Device KIT, To check blood sugar daily, Disp: 1 each, Rfl: 0 .  cetirizine (ZYRTEC) 10 MG tablet, Take 1 tablet (10 mg total) by mouth daily., Disp: 90 tablet, Rfl: 1 .  glucose blood (ONE TOUCH ULTRA TEST) test strip, USE TEST STRIP(S) TO CHECK BLLOD SUGAR DAILY, Disp: 100 each, Rfl: 12 .   ketoconazole (NIZORAL) 2 % cream, APPLY CREAM TOPICALLY TWICE DAILY TO FEET, Disp: , Rfl: 2 .  Lancets (ONETOUCH ULTRASOFT) lancets, USE LANCET(S)  TO CHECK GLUCOSE DAILY, Disp: 100 each, Rfl: 12 .  lisinopril-hydrochlorothiazide (PRINZIDE,ZESTORETIC) 20-12.5 MG tablet, TAKE 2 TABLETS BY MOUTH ONCE DAILY, Disp: 180 tablet, Rfl: 1 .  metFORMIN (GLUCOPHAGE) 1000 MG tablet, Take 1 tablet (1,000 mg total) by mouth 2 (two) times daily with a meal., Disp: 180 tablet, Rfl: 1 .  montelukast (SINGULAIR) 10 MG tablet, TAKE 1 TABLET BY MOUTH ONCE DAILY, Disp: 30 tablet, Rfl: 11 .  sitaGLIPtin (JANUVIA) 100 MG tablet, Take 1 tablet (100 mg total) by mouth daily., Disp: 90 tablet, Rfl: 1 .  spironolactone (ALDACTONE) 25 MG tablet, TAKE 1 TABLET BY MOUTH ONCE DAILY, Disp: 90 tablet, Rfl: 1 .  verapamil (CALAN) 80 MG tablet, Take 1 tablet (80 mg total) by mouth 2 (two) times daily., Disp: 180 tablet, Rfl: 1 .  albuterol (PROVENTIL HFA) 108 (90 Base) MCG/ACT inhaler, Inhale 2 puffs every 6 (six) hours as needed into the lungs. (Patient not taking: Reported on 03/10/2018), Disp: 3 Inhaler, Rfl: 1  Review of Systems  Constitutional: Positive for appetite change.  Respiratory: Negative.   Cardiovascular: Negative.   Gastrointestinal: Positive for abdominal pain, diarrhea, nausea and vomiting. Negative for anal bleeding, blood in stool and constipation.  Neurological: Negative.     Social History   Tobacco Use  . Smoking status: Former Smoker    Packs/day:  0.25    Years: 21.00    Pack years: 5.25    Last attempt to quit: 06/08/1997    Years since quitting: 20.9  . Smokeless tobacco: Never Used  Substance Use Topics  . Alcohol use: No   Objective:   BP (!) 163/82 (BP Location: Left Arm, Patient Position: Sitting, Cuff Size: Normal)   Pulse 80   Temp 98 F (36.7 C) (Oral)   Resp 16   Wt 215 lb (97.5 kg)   BMI 44.94 kg/m  Vitals:   05/19/18 1537  BP: (!) 163/82  Pulse: 80  Resp: 16  Temp: 98 F  (36.7 C)  TempSrc: Oral  Weight: 215 lb (97.5 kg)     Physical Exam  Constitutional: She is oriented to person, place, and time. She appears well-developed and well-nourished. No distress.  Cardiovascular: Normal rate, regular rhythm and normal heart sounds. Exam reveals no gallop and no friction rub.  No murmur heard. Pulmonary/Chest: Effort normal and breath sounds normal. No respiratory distress. She has no wheezes. She has no rales.  Abdominal: Soft. Normal appearance and bowel sounds are normal. She exhibits no distension and no mass. There is no hepatosplenomegaly. There is generalized tenderness and tenderness in the epigastric area. There is no rebound, no guarding and no CVA tenderness.  Neurological: She is alert and oriented to person, place, and time.  Skin: Skin is warm and dry. She is not diaphoretic.  Vitals reviewed.       Assessment & Plan:     1. Gastroenteritis Will check labs as below. Stool culture to make sure not e.coli since patient has been eating salads and there is an active recall in the area. Will give zofran and imodium for nausea, vomiting and diarrhea. Push fluids. Call if symptoms worsen.  - Stool Culture - CBC w/Diff/Platelet - Comprehensive Metabolic Panel (CMET) - Lipase  2. Diarrhea, unspecified type See above medical treatment plan. - Stool Culture - CBC w/Diff/Platelet - Comprehensive Metabolic Panel (CMET) - Lipase - loperamide (IMODIUM A-D) 2 MG tablet; Take 1 tablet (2 mg total) by mouth 4 (four) times daily as needed for diarrhea or loose stools.  Dispense: 30 tablet; Refill: 0  3. Non-intractable vomiting with nausea, unspecified vomiting type See above medical treatment plan. - Stool Culture - ondansetron (ZOFRAN) 4 MG tablet; Take 1 tablet (4 mg total) by mouth every 8 (eight) hours as needed.  Dispense: 20 tablet; Refill: 0 - CBC w/Diff/Platelet - Comprehensive Metabolic Panel (CMET) - Lipase  4. Epigastric pain See above  medical treatment plan. - CBC w/Diff/Platelet - Comprehensive Metabolic Panel (CMET) - Lipase       Mar Daring, PA-C  [facility]

## 2018-05-19 NOTE — Telephone Encounter (Signed)
Refill at [facility]

## 2018-05-19 NOTE — Patient Instructions (Signed)
Viral Gastroenteritis, Adult  Viral gastroenteritis is also known as the stomach flu. This condition is caused by certain germs (viruses). These germs can be passed from person to person very easily (are very contagious). This condition can cause sudden watery poop (diarrhea), fever, and throwing up (vomiting).  Having watery poop and throwing up can make you feel weak and cause you to get dehydrated. Dehydration can make you tired and thirsty, make you have a dry mouth, and make it so you pee (urinate) less often. Older adults and people with other diseases or a weak defense system (immune system) are at higher risk for dehydration. It is important to replace the fluids that you lose from having watery poop and throwing up.  Follow these instructions at home:  Follow instructions from your doctor about how to care for yourself at home.  Eating and drinking    Follow these instructions as told by your doctor:   Take an oral rehydration solution (ORS). This is a drink that is sold at pharmacies and stores.   Drink clear fluids in small amounts as you are able, such as:  ? Water.  ? Ice chips.  ? Diluted fruit juice.  ? Low-calorie sports drinks.   Eat bland, easy-to-digest foods in small amounts as you are able, such as:  ? Bananas.  ? Applesauce.  ? Rice.  ? Low-fat (lean) meats.  ? Toast.  ? Crackers.   Avoid fluids that have a lot of sugar or caffeine in them.   Avoid alcohol.   Avoid spicy or fatty foods.    General instructions   Drink enough fluid to keep your pee (urine) clear or pale yellow.   Wash your hands often. If you cannot use soap and water, use hand sanitizer.   Make sure that all people in your home wash their hands well and often.   Rest at home while you get better.   Take over-the-counter and prescription medicines only as told by your doctor.   Watch your condition for any changes.   Take a warm bath to help with any burning or pain from having watery poop.   Keep all follow-up  visits as told by your doctor. This is important.  Contact a doctor if:   You cannot keep fluids down.   Your symptoms get worse.   You have new symptoms.   You feel light-headed or dizzy.   You have muscle cramps.  Get help right away if:   You have chest pain.   You feel very weak or you pass out (faint).   You see blood in your throw-up.   Your throw-up looks like coffee grounds.   You have bloody or black poop (stools) or poop that look like tar.   You have a very bad headache, a stiff neck, or both.   You have a rash.   You have very bad pain, cramping, or bloating in your belly (abdomen).   You have trouble breathing.   You are breathing very quickly.   Your heart is beating very quickly.   Your skin feels cold and clammy.   You feel confused.   You have pain when you pee.   You have signs of dehydration, such as:  ? Dark pee, hardly any pee, or no pee.  ? Cracked lips.  ? Dry mouth.  ? Sunken eyes.  ? Sleepiness.  ? Weakness.  This information is not intended to replace advice given to you by your   health care provider. Make sure you discuss any questions you have with your health care provider.  Document Released: 11/12/2007 Document Revised: 12/14/2015 Document Reviewed: 01/30/2015  Elsevier Interactive Patient Education  2017 Elsevier Inc.        Food Choices to Help Relieve Diarrhea, Adult  When you have diarrhea, the foods you eat and your eating habits are very important. Choosing the right foods and drinks can help:   Relieve diarrhea.   Replace lost fluids and nutrients.   Prevent dehydration.    What general guidelines should I follow?  Relieving diarrhea   Choose foods with less than 2 g or .07 oz. of fiber per serving.   Limit fats to less than 8 tsp (38 g or 1.34 oz.) a day.   Avoid the following:  ? Foods and beverages sweetened with high-fructose corn syrup, honey, or sugar alcohols such as xylitol, sorbitol, and mannitol.  ? Foods that contain a lot of fat or  sugar.  ? Fried, greasy, or spicy foods.  ? High-fiber grains, breads, and cereals.  ? Raw fruits and vegetables.   Eat foods that are rich in probiotics. These foods include dairy products such as yogurt and fermented milk products. They help increase healthy bacteria in the stomach and intestines (gastrointestinal tract, or GI tract).   If you have lactose intolerance, avoid dairy products. These may make your diarrhea worse.   Take medicine to help stop diarrhea (antidiarrheal medicine) only as told by your health care provider.  Replacing nutrients   Eat small meals or snacks every 3-4 hours.   Eat bland foods, such as white rice, toast, or baked potato, until your diarrhea starts to get better. Gradually reintroduce nutrient-rich foods as tolerated or as told by your health care provider. This includes:  ? Well-cooked protein foods.  ? Peeled, seeded, and soft-cooked fruits and vegetables.  ? Low-fat dairy products.   Take vitamin and mineral supplements as told by your health care provider.  Preventing dehydration     Start by sipping water or a special solution to prevent dehydration (oral rehydration solution, ORS). Urine that is clear or pale yellow means that you are getting enough fluid.   Try to drink at least 8-10 cups of fluid each day to help replace lost fluids.   You may add other liquids in addition to water, such as clear juice or decaffeinated sports drinks, as tolerated or as told by your health care provider.   Avoid drinks with caffeine, such as coffee, tea, or soft drinks.   Avoid alcohol.  What foods are recommended?  The items listed may not be a complete list. Talk with your health care provider about what dietary choices are best for you.  Grains  White rice. White, French, or pita breads (fresh or toasted), including plain rolls, buns, or bagels. White pasta. Saltine, soda, or graham crackers. Pretzels. Low-fiber cereal. Cooked cereals made with water (such as cornmeal,  farina, or cream cereals). Plain muffins. Matzo. Melba toast. Zwieback.  Vegetables  Potatoes (without the skin). Most well-cooked and canned vegetables without skins or seeds. Tender lettuce.  Fruits  Apple sauce. Fruits canned in juice. Cooked apricots, cherries, grapefruit, peaches, pears, or plums. Fresh bananas and cantaloupe.  Meats and other protein foods  Baked or boiled chicken. Eggs. Tofu. Fish. Seafood. Smooth nut butters. Ground or well-cooked tender beef, ham, veal, lamb, pork, or poultry.  Dairy  Plain yogurt, kefir, and unsweetened liquid yogurt. Lactose-free milk, buttermilk, skim   milk, or soy milk. Low-fat or nonfat hard cheese.  Beverages  Water. Low-calorie sports drinks. Fruit juices without pulp. Strained tomato and vegetable juices. Decaffeinated teas. Sugar-free beverages not sweetened with sugar alcohols. Oral rehydration solutions, if approved by your health care provider.  Seasoning and other foods  Bouillon, broth, or soups made from recommended foods.  What foods are not recommended?  The items listed may not be a complete list. Talk with your health care provider about what dietary choices are best for you.  Grains  Whole grain, whole wheat, bran, or rye breads, rolls, pastas, and crackers. Wild or brown rice. Whole grain or bran cereals. Barley. Oats and oatmeal. Corn tortillas or taco shells. Granola. Popcorn.  Vegetables  Raw vegetables. Fried vegetables. Cabbage, broccoli, Brussels sprouts, artichokes, baked beans, beet greens, corn, kale, legumes, peas, sweet potatoes, and yams. Potato skins. Cooked spinach and cabbage.  Fruits  Dried fruit, including raisins and dates. Raw fruits. Stewed or dried prunes. Canned fruits with syrup.  Meat and other protein foods  Fried or fatty meats. Deli meats. Chunky nut butters. Nuts and seeds. Beans and lentils. Bacon. Hot dogs. Sausage.  Dairy  High-fat cheeses. Whole milk, chocolate milk, and beverages made with milk, such as milk shakes.  Half-and-half. Cream. sour cream. Ice cream.  Beverages  Caffeinated beverages (such as coffee, tea, soda, or energy drinks). Alcoholic beverages. Fruit juices with pulp. Prune juice. Soft drinks sweetened with high-fructose corn syrup or sugar alcohols. High-calorie sports drinks.  Fats and oils  Butter. Cream sauces. Margarine. Salad oils. Plain salad dressings. Olives. Avocados. Mayonnaise.  Sweets and desserts  Sweet rolls, doughnuts, and sweet breads. Sugar-free desserts sweetened with sugar alcohols such as xylitol and sorbitol.  Seasoning and other foods  Honey. Hot sauce. Chili powder. Gravy. Cream-based or milk-based soups. Pancakes and waffles.  Summary   When you have diarrhea, the foods you eat and your eating habits are very important.   Make sure you get at least 8-10 cups of fluid each day, or enough to keep your urine clear or pale yellow.   Eat bland foods and gradually reintroduce healthy, nutrient-rich foods as tolerated, or as told by your health care provider.   Avoid high-fiber, fried, greasy, or spicy foods.  This information is not intended to replace advice given to you by your health care provider. Make sure you discuss any questions you have with your health care provider.  Document Released: 08/16/2003 Document Revised: 05/23/2016 Document Reviewed: 05/23/2016  Elsevier Interactive Patient Education  2018 Elsevier Inc.

## 2018-05-20 LAB — CBC WITH DIFFERENTIAL/PLATELET
Basophils Absolute: 0 10*3/uL (ref 0.0–0.2)
Basos: 0 %
EOS (ABSOLUTE): 0 10*3/uL (ref 0.0–0.4)
Eos: 1 %
Hematocrit: 34.9 % (ref 34.0–46.6)
Hemoglobin: 12.2 g/dL (ref 11.1–15.9)
Immature Grans (Abs): 0 10*3/uL (ref 0.0–0.1)
Immature Granulocytes: 0 %
Lymphocytes Absolute: 2.7 10*3/uL (ref 0.7–3.1)
Lymphs: 33 %
MCH: 29 pg (ref 26.6–33.0)
MCHC: 35 g/dL (ref 31.5–35.7)
MCV: 83 fL (ref 79–97)
Monocytes Absolute: 0.4 10*3/uL (ref 0.1–0.9)
Monocytes: 5 %
Neutrophils Absolute: 5 10*3/uL (ref 1.4–7.0)
Neutrophils: 61 %
Platelets: 321 10*3/uL (ref 150–450)
RBC: 4.2 x10E6/uL (ref 3.77–5.28)
RDW: 13.6 % (ref 12.3–15.4)
WBC: 8.2 10*3/uL (ref 3.4–10.8)

## 2018-05-20 LAB — COMPREHENSIVE METABOLIC PANEL
ALT: 18 IU/L (ref 0–32)
AST: 7 IU/L (ref 0–40)
Albumin/Globulin Ratio: 1.4 (ref 1.2–2.2)
Albumin: 4 g/dL (ref 3.5–5.5)
Alkaline Phosphatase: 75 IU/L (ref 39–117)
BUN/Creatinine Ratio: 12 (ref 9–23)
BUN: 12 mg/dL (ref 6–24)
Bilirubin Total: 0.3 mg/dL (ref 0.0–1.2)
CO2: 26 mmol/L (ref 20–29)
Calcium: 10.2 mg/dL (ref 8.7–10.2)
Chloride: 99 mmol/L (ref 96–106)
Creatinine, Ser: 0.98 mg/dL (ref 0.57–1.00)
GFR calc Af Amer: 73 mL/min/{1.73_m2} (ref >59)
GFR calc non Af Amer: 63 mL/min/{1.73_m2} (ref >59)
Globulin, Total: 2.8 g/dL (ref 1.5–4.5)
Glucose: 114 mg/dL — ABNORMAL HIGH (ref 65–99)
Potassium: 3.9 mmol/L (ref 3.5–5.2)
Sodium: 140 mmol/L (ref 134–144)
Total Protein: 6.8 g/dL (ref 6.0–8.5)

## 2018-05-20 LAB — LIPASE: Lipase: 18 U/L (ref 14–72)

## 2018-05-21 ENCOUNTER — Telehealth: Payer: Self-pay | Admitting: Physician Assistant

## 2018-05-21 NOTE — Telephone Encounter (Signed)
Pt calling regarding the following:  1) Pt needing antibiotic she is still hoarse and having a runny nose.  2)  Needing labs results.   Please advise.  Thanks, [employer]

## 2018-05-21 NOTE — Telephone Encounter (Signed)
lmtcb

## 2018-05-21 NOTE — Telephone Encounter (Signed)
Patient advised as below.  

## 2018-05-21 NOTE — Telephone Encounter (Signed)
Labs were normal.   How is her diarrhea?  How long has she had runny nose? May only be viral. Is she taking anything for the runny nose OTC? May benefit from Coricidin HBP or Mucinex (plain) for symptoms if she has not tried anything.

## 2018-05-24 ENCOUNTER — Telehealth: Payer: Self-pay | Admitting: *Deleted

## 2018-05-24 LAB — STOOL CULTURE: E coli, Shiga toxin Assay: NEGATIVE

## 2018-05-24 NOTE — Telephone Encounter (Signed)
LMOVM for pt to return call 

## 2018-05-24 NOTE — Telephone Encounter (Signed)
Patient was notified of results. Expressed understanding.  

## 2018-05-24 NOTE — Telephone Encounter (Signed)
-----   Message from Mar Daring, PA-C sent at 05/24/2018  7:59 AM EST ----- Stool cultures are seemingly negative at the preliminary report. Just awaiting campylobacter result. It is negative for e coli, salmonella and shigella

## 2018-06-10 ENCOUNTER — Telehealth: Payer: Self-pay | Admitting: Physician Assistant

## 2018-06-10 NOTE — Telephone Encounter (Signed)
Please advise referral?  

## 2018-06-10 NOTE — Telephone Encounter (Signed)
Pt needing another dental referral - pt has changed insurances.  Please advise.  Thanks, [employer]

## 2018-06-13 NOTE — Telephone Encounter (Signed)
Does she know who is in network for her now for me to specify in the referral?

## 2018-06-14 NOTE — Telephone Encounter (Signed)
LMOVM for pt to return call 

## 2018-06-14 NOTE — Telephone Encounter (Signed)
Patient is returning your call. KW 

## 2018-06-16 ENCOUNTER — Ambulatory Visit (INDEPENDENT_AMBULATORY_CARE_PROVIDER_SITE_OTHER): Payer: Medicare Other | Admitting: Physician Assistant

## 2018-06-16 ENCOUNTER — Telehealth: Payer: Self-pay

## 2018-06-16 ENCOUNTER — Encounter: Payer: Self-pay | Admitting: Physician Assistant

## 2018-06-16 ENCOUNTER — Ambulatory Visit: Payer: Self-pay

## 2018-06-16 VITALS — BP 145/80 | HR 79 | Temp 98.1°F | Resp 16 | Wt 219.0 lb

## 2018-06-16 DIAGNOSIS — E119 Type 2 diabetes mellitus without complications: Secondary | ICD-10-CM

## 2018-06-16 DIAGNOSIS — I10 Essential (primary) hypertension: Secondary | ICD-10-CM

## 2018-06-16 LAB — POCT GLYCOSYLATED HEMOGLOBIN (HGB A1C): Hemoglobin A1C: 8.4 % — AB (ref 4.0–5.6)

## 2018-06-16 NOTE — Chronic Care Management (AMB) (Signed)
  Chronic Care Management Note   Alexis Lloyd is a 58 year old female primary care patient of Fenton Malling PA-C who was referred to the CCM program for assistance with medication management/assistance and DM/HTN disease management. Follow up phone call today to address progression towards established goals.Last office visit with Mar Daring, PA-C was 06/16/18.   Was unable to reach patient via telephone today for diabetes management follow up and have left HIPAA compliant voicemail asking patient to return my call. (unsuccessful outreach #1).  Plan: Will follow-up within 3-5  business days via telephone.   Harrie Cazarez E. Rollene Rotunda, RN, BSN Nurse Care Coordinator [facility] [phone]

## 2018-06-16 NOTE — Progress Notes (Signed)
Patient: Alexis Lloyd Female    DOB: [birth year]   58 y.o.   MRN: [id number] Visit Date: 06/16/2018  Today's Provider: Mar Daring, PA-C   Chief Complaint  Patient presents with  . Follow-up  . Diabetes  . Hypertension  . Hyperlipidemia   Subjective:     HPI   Diabetes Mellitus Type II, Follow-up:   Lab Results  Component Value Date   HGBA1C 8.4 (A) 06/16/2018   HGBA1C 13.8 (A) 03/02/2018   HGBA1C 9.0 (H) 08/31/2017   Last seen for diabetes 4 months ago.  Management since then includes; no changes. She reports good compliance with treatment. She is not having side effects. none Current symptoms include none and have been unchanged. Home blood sugar records: fasting range: 121-136  Episodes of hypoglycemia? no   Current Insulin Regimen: n/a Most Recent Eye Exam: due Weight trend: stable Prior visit with dietician: no Current diet: in general, a "healthy" diet   Current exercise: none  ---------------------------------------------------------------    Hypertension, follow-up:  BP Readings from Last 3 Encounters:  06/16/18 (!) 145/80  05/19/18 (!) 163/82  03/02/18 116/74    She was last seen for hypertension 4 months ago.  BP at that visit was 116/74. Management since that visit includes; no changes.She reports good compliance with treatment. She is not having side effects. none She is not exercising. She is adherent to low salt diet.   Outside blood pressures are normal. She is experiencing none.  Patient denies none.   Cardiovascular risk factors include diabetes mellitus.  Use of agents associated with hypertension: none.   ---------------------------------------------------------------     Lipid/Cholesterol, Follow-up:   Last seen for this 4 months ago.  Management since that visit includes; no changes.  Last Lipid Panel:    Component Value Date/Time   CHOL 164 08/31/2017 1050   TRIG 117 08/31/2017 1050   HDL 28 (L)  08/31/2017 1050   CHOLHDL 5.2 08/28/2016 1050   VLDL 21 08/28/2016 1050   LDLCALC 113 (H) 08/31/2017 1050    She reports good compliance with treatment. She is not having side effects. none  Wt Readings from Last 3 Encounters:  06/16/18 219 lb (99.3 kg)  05/19/18 215 lb (97.5 kg)  03/02/18 217 lb (98.4 kg)   ---------------------------------------------------------------     Allergies  Allergen Reactions  . Allegra [Fexofenadine] Nausea And Vomiting  . Avelox [Moxifloxacin] Other (See Comments)    Hallucinations  . Bactrim [Sulfamethoxazole-Trimethoprim] Other (See Comments)    unknown  . Etodolac Nausea Only  . Fish-Derived Products Nausea And Vomiting    With "seafood"  . Penicillins Diarrhea and Nausea And Vomiting  . Shellfish Allergy Nausea Only  . Sulfa Antibiotics Other (See Comments)    unknown  . Macrobid [employer] Macro] Other (See Comments)    Constipation and globus hystericus.     Current Outpatient Medications:  .  albuterol (PROVENTIL HFA) 108 (90 Base) MCG/ACT inhaler, Inhale 2 puffs every 6 (six) hours as needed into the lungs., Disp: 3 Inhaler, Rfl: 1 .  Blood Glucose Monitoring Suppl (ONE TOUCH ULTRA SYSTEM KIT) w/Device KIT, To check blood sugar daily, Disp: 1 each, Rfl: 0 .  cetirizine (ZYRTEC) 10 MG tablet, Take 1 tablet (10 mg total) by mouth daily., Disp: 90 tablet, Rfl: 1 .  glucose blood (ONE TOUCH ULTRA TEST) test strip, USE TEST STRIP(S) TO CHECK BLLOD SUGAR DAILY, Disp: 100 each, Rfl: 12 .  ketoconazole (NIZORAL) 2 %  cream, APPLY CREAM TOPICALLY TWICE DAILY TO FEET, Disp: , Rfl: 2 .  Lancets (ONETOUCH ULTRASOFT) lancets, USE LANCET(S)  TO CHECK GLUCOSE DAILY, Disp: 100 each, Rfl: 12 .  lisinopril-hydrochlorothiazide (PRINZIDE,ZESTORETIC) 20-12.5 MG tablet, TAKE 2 TABLETS BY MOUTH ONCE DAILY, Disp: 180 tablet, Rfl: 1 .  metFORMIN (GLUCOPHAGE) 1000 MG tablet, Take 1 tablet (1,000 mg total) by mouth 2 (two) times daily with a  meal., Disp: 180 tablet, Rfl: 1 .  montelukast (SINGULAIR) 10 MG tablet, TAKE 1 TABLET BY MOUTH ONCE DAILY, Disp: 30 tablet, Rfl: 11 .  sitaGLIPtin (JANUVIA) 100 MG tablet, Take 1 tablet (100 mg total) by mouth daily., Disp: 90 tablet, Rfl: 1 .  spironolactone (ALDACTONE) 25 MG tablet, TAKE 1 TABLET BY MOUTH ONCE DAILY, Disp: 90 tablet, Rfl: 1 .  verapamil (CALAN) 80 MG tablet, Take 1 tablet (80 mg total) by mouth 2 (two) times daily., Disp: 180 tablet, Rfl: 1 .  loperamide (IMODIUM A-D) 2 MG tablet, Take 1 tablet (2 mg total) by mouth 4 (four) times daily as needed for diarrhea or loose stools. (Patient not taking: Reported on 06/16/2018), Disp: 30 tablet, Rfl: 0 .  ondansetron (ZOFRAN) 4 MG tablet, Take 1 tablet (4 mg total) by mouth every 8 (eight) hours as needed. (Patient not taking: Reported on 06/16/2018), Disp: 20 tablet, Rfl: 0  Review of Systems  Constitutional: Negative for appetite change, chills, fatigue and fever.  Respiratory: Negative for chest tightness and shortness of breath.   Cardiovascular: Negative for chest pain and palpitations.  Gastrointestinal: Negative for abdominal pain, nausea and vomiting.  Neurological: Negative for dizziness and weakness.    Social History   Tobacco Use  . Smoking status: Former Smoker    Packs/day: 0.25    Years: 21.00    Pack years: 5.25    Last attempt to quit: 06/08/1997    Years since quitting: 21.0  . Smokeless tobacco: Never Used  Substance Use Topics  . Alcohol use: No      Objective:   BP (!) 145/80 (BP Location: Left Arm, Patient Position: Sitting, Cuff Size: Large)   Pulse 79   Temp 98.1 F (36.7 C) (Oral)   Resp 16   Wt 219 lb (99.3 kg)   SpO2 96%   BMI 45.77 kg/m  Vitals:   06/16/18 1104  BP: (!) 145/80  Pulse: 79  Resp: 16  Temp: 98.1 F (36.7 C)  TempSrc: Oral  SpO2: 96%  Weight: 219 lb (99.3 kg)     Physical Exam Vitals signs reviewed.  Constitutional:      Appearance: She is well-developed.    HENT:     Head: Normocephalic and atraumatic.  Neck:     Musculoskeletal: Normal range of motion and neck supple.  Pulmonary:     Effort: Pulmonary effort is normal. No respiratory distress.  Psychiatric:        Behavior: Behavior normal.        Thought Content: Thought content normal.        Judgment: Judgment normal.        Assessment & Plan    1. Type 2 diabetes mellitus without complication, without long-term current use of insulin (HCC) A1c much improved from 13.8 to 8.4. Patient has been more compliant with medications and dieting. Continue metformin and januvia. She reports the CCM program is really helping her and keeping her ficused. I will see her back in 3 months for CPE.  - POCT glycosylated hemoglobin (Hb A1C)  Mar Daring, PA-C  [facility]

## 2018-06-16 NOTE — Telephone Encounter (Signed)
Patient was seen in the office today

## 2018-06-23 ENCOUNTER — Ambulatory Visit: Payer: Self-pay

## 2018-06-23 DIAGNOSIS — E119 Type 2 diabetes mellitus without complications: Secondary | ICD-10-CM

## 2018-06-23 DIAGNOSIS — I10 Essential (primary) hypertension: Secondary | ICD-10-CM

## 2018-06-23 NOTE — Chronic Care Management (AMB) (Signed)
Chronic Care Management   Follow Up Note   06/23/2018 Name: Alexis Lloyd MRN: [id number] DOB: Aug 07, 1960  Referred by: Mar Daring, PA-C Reason for referral : Chronic Care Management (follow up DM)    Subjective: " I am so happy with my A1C results and it is all because of your and Julies support"   Objective:  Lab Results  Component Value Date   HGBA1C 8.4 (A) 06/16/2018   BP Readings from Last 3 Encounters:  06/16/18 (!) 145/80  05/19/18 (!) 163/82  03/02/18 116/74     Assessment: Alexis Lloyd is a 58 year old female primary care patient of Fenton Malling PA-C who was referred to the CCM program for assistance with medication management/assistance and DM/HTN disease management.Follow up phone call today to address progression towards established goals.Last office visit with Mar Daring, PA-C was 06/16/18.   Goals Addressed    . COMPLETED: "I need help getting my sugar back down. It has gone way up!" (pt-stated)       Alexis Lloyd has done an excellent adhering to education provided by the CCM Team related to her diabetes and self care. The has lowered her A1C from 13.8 to 8.4 through medication compliance, healthy eating and exercise. She is extremely motivated to continue her healthy lifestyle and now has a goal of lowering her A1C to 7 over the next 3 months.   Alexis Lloyd is very active in her church and participating in a 4 month progressive fast but understands she has to modify the forth month fast as participants are only allowed liquids during the day. This month she is giving up fired foods, Feb fried foods and red meat, March Fried foods, red meat, and sugar, and April all solid foods during daylight hours. During the April fast, she will modify to include allowance of vegetables and fruits during daylight hours. She will monitor her blood sugars more frequently during this month to assess for hypoglycemia.  Clinical Goals: Over the next 30 days,  patient will continue to report adherence to DM self management plan including improvement in portion size management, decreased intake of sugary fruits and desserts, and daily cbg check/recording as well as utilizing healthy "snacks" if she does not have time for a meal.   Interventions: Offered emotional support and commended patient on commitment to improving her A1C through lifestyle changes. Reinforced DM education previously given including portion size management, decreased intake of sugary fruits and desserts, and checking/recording blood glucose daily. Encouraged patient to grab healthy snacks she could eat on the "go" if she did not have time to eat a meal.     . "I want to get my A1C down to 7" (pt-stated)       Current Barriers:  . None  Nurse Case Manager Clinical Goal(s): Over the next 3 months patient will lower her A1C to 7 through diet, exercise, medication compliance  Interventions:  . Provided emotional support, praise for her accomplishment of lowering her A1C from 13.8 to 8.4, and reassurance that lowing A1C to 7 is a goal she can accomplish. . Reinforced education previously learned   Patient Self Care Activities:  . Take medications as prescribed . Check sugars daily as directed an record . Exercise daily (walking)  Please see past updates related to this goal by clicking on the "Past Updates" button in the selected goal     Plan: Will follow up with Alexis Lloyd in two weeks to assess physical response to "  fasting".  Will continue to follow up with Alexis Lloyd every 30 days until new goal met.   Shanell Aden E. Rollene Rotunda, RN, BSN Nurse Care Coordinator [facility] [phone]

## 2018-06-23 NOTE — Patient Instructions (Addendum)
1. Thank you for your partnership to improve your health and meet your health goals. We are so proud of all your hard work!!!!! 2. Please continue to monitor your blood sugars, eat healthy meals, and remain active. Continue to walk for exercise 7 days a week if possible. 3. Fasting for any reason can be done if modified. Instead of a liquid fast, give up something physical that brings your pleasure such as TV. 4. CCM RN CM will call you in two weeks to see how your are doing with week month 1 fast-giving up fried foods.  CCM (Chronic Care Management) Team   Trish Fountain RN, BSN Nurse Care Coordinator  [phone]  Ruben Reason PharmD  Clinical Pharmacist  [phone]  Goals Addressed            This Visit's Progress   . COMPLETED: "I need help getting my sugar back down. It has gone way up!" (pt-stated)         Clinical Goals: Over the next 30 days, patient will continue to report adherence to DM self management plan including improvement in portion size management, decreased intake of sugary fruits and desserts, and daily cbg check/recording as well as utilizing healthy "snacks" if she does not have time for a meal.   Interventions: Offered emotional support and commended patient on commitment to improving her A1C through lifestyle changes. Reinforced DM education previously given including portion size management, decreased intake of sugary fruits and desserts, and checking/recording blood glucose daily. Encouraged patient to grab healthy snacks she could eat on the "go" if she did not have time to eat a meal.       . "I want to get my A1C down to 7" (pt-stated)       Current Barriers:  . None  Nurse Case Manager Clinical Goal(s): Over the next 3 months patient will lower her A1C to 7 through diet, exercise, medication compliance  Interventions:  . Provided emotional support, praise for her accomplishment of lowering her A1C from 13.8 to 8.4, and reassurance that  lowing A1C to 7 is a goal she can accomplish. . Reinforced education previously learned   Patient Self Care Activities:  . Take medications as prescribed . Check sugars daily as directed an record . Exercise daily (walking)  Please see past updates related to this goal by clicking on the "Past Updates" button in the selected goal    The patient verbalized understanding of instructions provided today and declined a print copy of patient instruction materials.

## 2018-06-25 ENCOUNTER — Telehealth: Payer: Self-pay

## 2018-06-25 NOTE — Telephone Encounter (Signed)
Please advise 

## 2018-06-25 NOTE — Telephone Encounter (Signed)
We do not have it listed as an allergy and I do not see any documentation stating she had issues with it.

## 2018-06-25 NOTE — Telephone Encounter (Signed)
[place] was advised.

## 2018-06-25 NOTE — Telephone Encounter (Signed)
[place] from Dr. Kirke Shaggy office and states that in December we had sent them correspondence stating that patient can take Clindamycin as a antibiotic for future treatment. [place] states that patient has listed in there records as Clindamycin being a allergy. I reveiewed in chart and did not see this listed as allergy. [place] wants to know if this medication is okay to give to patient? She can be reached at [phone]. KW

## 2018-06-29 ENCOUNTER — Telehealth: Payer: Self-pay | Admitting: Physician Assistant

## 2018-06-29 NOTE — Telephone Encounter (Signed)
I have placed multiple referrals for her to different clinics. Can we check with referrals to see what has happened please? Is she changing back and forth? I feel like she was initially with [facility] and then said at the beginning of the year they were out of network? I do not believe she needs another referral. I think she just needs to call for an appt.

## 2018-06-29 NOTE — Telephone Encounter (Signed)
Hi Judson Roch do you know the update on her referral not for the school dental but the clinic?

## 2018-06-29 NOTE — Telephone Encounter (Signed)
Please advise 

## 2018-06-29 NOTE — Telephone Encounter (Signed)
Pt wanting a referral to the [facility] for her teeth.  Please advise.  Thanks, [employer]

## 2018-06-30 ENCOUNTER — Ambulatory Visit: Payer: Self-pay | Admitting: Physician Assistant

## 2018-07-02 NOTE — Telephone Encounter (Signed)
Referral refaxed 07/02/18 to [facility].Previously there was a problem with insurance

## 2018-07-02 NOTE — Telephone Encounter (Signed)
Please advise 

## 2018-07-07 ENCOUNTER — Ambulatory Visit (INDEPENDENT_AMBULATORY_CARE_PROVIDER_SITE_OTHER): Payer: Medicare Other

## 2018-07-07 DIAGNOSIS — I10 Essential (primary) hypertension: Secondary | ICD-10-CM | POA: Diagnosis not present

## 2018-07-07 DIAGNOSIS — E119 Type 2 diabetes mellitus without complications: Secondary | ICD-10-CM | POA: Diagnosis not present

## 2018-07-07 NOTE — Patient Instructions (Signed)
1. Continue to check you blood sugars daily. I am so proud of how well you are doing! 2. To continue to cut back on portion size, consider using a smaller plate and fill it up. This will "trick" your brain in thinking there is more food on you plate. 3. I will call you back in 3-4 weeks to provide support and answer any questions you may have. Please call us in the mean time if needed.  CCM (Chronic Care Management) Team   Trish Fountain RN, BSN Nurse Care Coordinator  [phone]  Ruben Reason PharmD  Clinical Pharmacist  [phone]  Goals Addressed            This Visit's Progress   . "I want to get my A1C down to 7" (pt-stated)       Current Barriers:  . None  Nurse Case Manager Clinical Goal(s): Over the next 3 months patient will lower her A1C to 7 through diet, exercise, medication compliance  Interventions:  . Provided emotional support, praise for her accomplishment of lowering her A1C from 13.8 to 8.4, and reassurance that lowing A1C to 7 is a goal she can accomplish. . Reinforced education previously learned . 07/07/2018 Discussed portion size reduction and discussed utilizing small plates to "trick" the brain to think there is an abundance of food on the plate.    Patient Self Care Activities:  . Take medications as prescribed . Check sugars daily as directed an record . Exercise daily (walking)  Please see past updates related to this goal by clicking on the "Past Updates" button in the selected goal        The patient verbalized understanding of instructions provided today and declined a print copy of patient instruction materials.

## 2018-07-07 NOTE — Chronic Care Management (AMB) (Signed)
  Chronic Care Management   Follow Up Note   07/07/2018 Name: Alexis Lloyd MRN: [id number] DOB: [birth year]  Referred by: Alexis Daring, PA-C Reason for referral : Chronic Care Management (DM/fasting follow up)   Subjective: "I am doing real good. My morning sugars are 117, 120s which is much better for me" "I am going to have my A1C down to 7"   Objective:  Lab Results  Component Value Date   HGBA1C 8.4 (A) 06/16/2018    <no information>   Assessment: Alexis Lloyd is a 58 year old female primary care patient of Alexis Malling PA-C who was referred to the CCM program for assistance with medication management/assistance and DM/HTN disease management.Follow up phone call today to address progression towards established goals. Alexis Lloyd was driving so we did not address any additional needs at this time. Will assess during next encounter in 3 weeks. Last office visit with Alexis Daring, PA-C was 06/16/18.   Goals Addressed    . "I want to get my A1C down to 7" (pt-stated)        Alexis Lloyd has done an excellent adhering to education provided by the CCM Team related to her diabetes and self care. The has lowered her A1C from 13.8 to 8.4 through medication compliance, healthy eating and exercise. She is extremely motivated to continue her healthy lifestyle and now has a goal of lowering her A1C to 7 over the next 3 months.   Alexis Lloyd is very active in her church and participating in a 4 month progressive fast but understands she has to modify the forth month fast as participants are only allowed liquids during the day. This month she is giving up fired foods, Feb fried foods and red meat, March Fried foods, red meat, and sugar, and April all solid foods during daylight hours. During the April fast, she will modify to include allowance of vegetables and fruits during daylight hours. She will monitor her blood sugars more frequently during this month to assess for  hypoglycemia.  Today she reports lower fasting sugars. Today's reading was 117. She attributes the lower number to reducing her fried foods which have breading on them. She remains active each day incorporating walking for exercise into her daily routine. She is taking her medications as prescribed.   Current Barriers:  . None  Nurse Case Manager Clinical Goal(s): Over the next 3 months patient will lower her A1C to 7 through diet, exercise, medication compliance  Interventions:  . Provided emotional support, praise for her accomplishment of lowering her A1C from 13.8 to 8.4, and reassurance that lowing A1C to 7 is a goal she can accomplish. . Reinforced education previously learned . 07/07/2018 Discussed portion size reduction and discussed utilizing small plates to "trick" the brain to think there is an abundance of food on the plate.   Patient Self Care Activities:  . Take medications as prescribed . Check sugars daily as directed an record . Exercise daily (walking)  Please see past updates related to this goal by clicking on the "Past Updates" button in the selected goal       Telephone follow up appointment with CCM team member scheduled for: 3 weeks   Samora Jernberg E. Rollene Rotunda, RN, BSN Nurse Care Coordinator [facility] Practice/[facility] Care Management [phone]

## 2018-07-28 ENCOUNTER — Telehealth: Payer: Self-pay | Admitting: Physician Assistant

## 2018-07-28 DIAGNOSIS — Z8719 Personal history of other diseases of the digestive system: Secondary | ICD-10-CM

## 2018-07-28 NOTE — Telephone Encounter (Signed)
Pt called saying you had referred her to the dental Clinic at [facility] but after reviewing they say she is not she is not eligible but that you can refer her to the Dental School which she may be eligible.  Pt's CB#  [phone]  Thanks  Con Memos

## 2018-07-28 NOTE — Telephone Encounter (Signed)
Please Review

## 2018-07-28 NOTE — Telephone Encounter (Signed)
LMTCB

## 2018-07-28 NOTE — Telephone Encounter (Signed)
Can we inform patient? She refused the dental school in the past.  Thanks.

## 2018-07-29 ENCOUNTER — Ambulatory Visit: Payer: Self-pay

## 2018-07-29 ENCOUNTER — Telehealth: Payer: Self-pay

## 2018-07-29 NOTE — Chronic Care Management (AMB) (Signed)
   Chronic Care Management   Note  07/29/2018 Name: JERAE IZARD MRN: [id number] DOB: 25-Jun-1960   Mrs. Greear is a 58 year old female primary care patient of Fenton Malling PA-C who was referred to the CCM program for assistance with medication management/assistance and DM/HTN disease management.Follow up phone call today to address progression towards established goals. Last office visit with Mar Daring, PA-C was 06/16/18.  Was unable to reach patient via telephone today. I have left HIPAA compliant voicemail asking patient to return my call. (unsuccessful outreach #1).   Plan: Will follow-up within 7 business days via telephone.     Tykeria Wawrzyniak E. Rollene Rotunda, RN, BSN Nurse Care Coordinator [facility] Practice/[facility] Care Management [phone]

## 2018-08-03 NOTE — Telephone Encounter (Signed)
Patient advised as directed below and reports that is true and that they told her that she needs to be refer to the Dental School.

## 2018-08-04 ENCOUNTER — Ambulatory Visit: Payer: Self-pay

## 2018-08-04 ENCOUNTER — Telehealth: Payer: Self-pay

## 2018-08-04 DIAGNOSIS — I10 Essential (primary) hypertension: Secondary | ICD-10-CM

## 2018-08-04 DIAGNOSIS — E119 Type 2 diabetes mellitus without complications: Secondary | ICD-10-CM

## 2018-08-04 NOTE — Chronic Care Management (AMB) (Signed)
  Chronic Care Management   Note  08/04/2018 Name: KARMON ANDIS MRN: [id number] DOB: 1960/10/08  Mrs. Neale is a 58 year old female primary care patient of Fenton Malling PA-C who was referred to the CCM program for assistance with medication management/assistance and DM/HTN disease management.Follow up phone call today to address progression towards established goals.Last office visit with Mar Daring, PA-C was 06/16/18.Today CCM RN CM follow up via telephone to assess progression towards established health goals.  Was unable to reach patient via telephone today. Ihave left HIPAA compliant voicemail asking patient to return my call. (unsuccessful outreach #2).   The CM team will reach out to the patient again over the next 7 days.   Dontavian Marchi E. Rollene Rotunda, RN, BSN Nurse Care Coordinator [facility] Practice/[facility] Care Management [phone]

## 2018-08-04 NOTE — Telephone Encounter (Signed)
New referral placed back to [facility] dental school

## 2018-08-11 ENCOUNTER — Ambulatory Visit (INDEPENDENT_AMBULATORY_CARE_PROVIDER_SITE_OTHER): Payer: Medicare Other

## 2018-08-11 DIAGNOSIS — K0889 Other specified disorders of teeth and supporting structures: Secondary | ICD-10-CM

## 2018-08-11 DIAGNOSIS — I10 Essential (primary) hypertension: Secondary | ICD-10-CM

## 2018-08-11 DIAGNOSIS — E119 Type 2 diabetes mellitus without complications: Secondary | ICD-10-CM

## 2018-08-11 NOTE — Patient Instructions (Addendum)
Thank you allowing the Chronic Care Management Team to be a part of your care! It was a pleasure speaking with you today!  1. Continue to monitor your blood sugars as directed. I am so happy you are doing well 2. Continue to monitor your carbohydrate intake and adhere to the ADA diet we discussed. 3. Continue to exercise as you have been 4. Continue to take all medications as prescribed 5. Please contact dental resources provided today. You will find them below  [facility] [place], [place] [address] Phone: [phone]  [place], [place] [address] Phone: [phone] General Dentist  [facility], [facility] General Dentist [address] Thressa [place]ller [place], [place] [address] Phone: [phone]  6. Please come by the office tomorrow and pick up a more comprehensive list of [facility] dental providers in the area   CCM (Chronic Care Management) Team   Trish Fountain RN, BSN Nurse Care Coordinator  [phone]  Ruben Reason PharmD  Clinical Pharmacist  [phone]   Goals Addressed            This Visit's Progress   . "I want to get my A1C down to 7" (pt-stated)       Current Barriers:  . None  Nurse Case Manager Clinical Goal(s): Over the next 3 months patient will lower her A1C to 7 through diet, exercise, medication compliance  Interventions:   Assessed for continued compliance with DM self care/management plan of care . Provided emotional support, praise for her accomplishment of lowering her A1C from 13.8 to 8.4, and reassurance that lowing A1C to 7 is a goal she can accomplish. . Reinforced education previously learned including importance of medication compliance, daily exercise, glucose monitoring, and adherence to ADA diet  Patient Self Care Activities:  . Take medications as prescribed . Check sugars daily as directed an record . Exercise daily  (walking) . Adheres to ADA diet and portion cotrol  Please see past updates related to this goal by clicking on the "Past Updates" button in the selected goal       . I really need to see a dentist (pt-stated)       Current Barriers:  Marland Kitchen Knowledge Deficits related to how to locate a dental provider within [place] . Film/video editor.   Nurse Case Manager Clinical Goal(s):  Marland Kitchen Over the next 7 days, patient will verbalize understanding of plan for obtaining a [facility] dental provider withing the [place] area  . Over the next 7 days, patient will contact resources provided today for dental care  Interventions:  . Provided patient with [facility] in-network providers. . Discussed patient picking up a more comprehensive list of in-network dental providers tomorrow at PCP office.  [place] [facility] Dentist [place], [place] [address] Phone: [phone]  [place], [place] [address] Phone: [phone] General Dentist  [facility], [facility] [place] General Dentist [address] [place] [place]ller [place], [place] [address] Phone: [phone]   Patient Self Care Activities:  . Schedule dental appointment with in network provider  Plan:  . Patient will contact dental provider and schedule appointment as soon as possible   Initial goal documentation        The patient verbalized understanding of instructions provided today and declined a print copy of patient instruction materials.   Telephone  follow up appointment with CCM team member scheduled for: tomorrow

## 2018-08-11 NOTE — Chronic Care Management (AMB) (Signed)
Chronic Care Management   Follow Up Note   08/11/2018 Name: Alexis Lloyd MRN: [id number] DOB: 11-Mar-1961  Referred by: Mar Daring, PA-C Reason for referral : Chronic Care Management (DM/Dentistry follow up)   Subjective: "I am still doing good with my diabetes and I think I have lost some weight by the way my clothes are fitting" "I really need to find a dentist because my two front teeth have cracked"   Objective:  Lab Results  Component Value Date   HGBA1C 8.4 (A) 06/16/2018   BP Readings from Last 3 Encounters:  06/16/18 (!) 145/80  05/19/18 (!) 163/82  03/02/18 116/74    Assessment: Alexis Lloyd is a 58 year old female primary care patient of Fenton Malling PA-C who was referred to the CCM program for assistance with medication management/assistance and DM/HTN disease management.Follow up phone call today to address progression towards established goals and to assess for additional goals. Last office visit with Mar Daring, PA-C was 06/16/18.  Goals Addressed            This Visit's Progress   . "I want to get my A1C down to 7" (pt-stated)       Alexis Lloyd states she is still doing well with her diabetes. She continues to adhere to plan of care related to self management of her diabetes. She continues to fast with her church although her fast is modified to allow her to consume small amounts of protein at each meal with fresh fruits and vegetables. She has cut back on her portion size and feels she has lost weight. She continues to take her medications as prescribed. She is parking farther away from the front door when she is shopping and incorporating more walking into her daily activity. She is eager to have her A1C checked again to see if she is closer to 7. Her fasting blood sugars are 120s or less.  Current Barriers:  . None  Nurse Case Manager Clinical Goal(s): Over the next 3 months patient will lower her A1C to 7 through diet, exercise,  medication compliance  Interventions:   Assessed for continued compliance with DM self care/management plan of care . Provided emotional support, praise for her accomplishment of lowering her A1C from 13.8 to 8.4, and reassurance that lowing A1C to 7 is a goal she can accomplish. . Reinforced education previously learned including importance of medication compliance, daily exercise, glucose monitoring, and adherence to ADA diet  Patient Self Care Activities:  . Take medications as prescribed . Check sugars daily as directed an record . Exercise daily (walking) . Adheres to ADA diet and portion cotrol  Please see past updates related to this goal by clicking on the "Past Updates" button in the selected goal     . I really need to see a dentist (pt-stated)       Alexis Lloyd states she needs to see a dentist as soon as possible. She had originally engaged with the [facility] dental clinic but was told she needed to fill out the online applications. As of today, she had not completed the application and CCM RN CM was in the process of completing the application with patient. Patient then states she has basic dental coverage through her insurance and "wish I knew who would take my dental insurance". CCM RN CM discussed options with patient and patient states she had rather utilize her insurance with a local provider than to travel to [facility]. CCM RN CM provided  patient with local dental providers that takes patients plan and is in network.  Current Barriers:  Marland Kitchen Knowledge Deficits related to how to locate a dental provider within [place] . Film/video editor.   Nurse Case Manager Clinical Goal(s):  Marland Kitchen Over the next 7 days, patient will verbalize understanding of plan for obtaining a [facility] dental provider withing the [place] area  . Over the next 7 days, patient will contact resources provided today for dental care  Interventions:  . Provided patient with [facility] in-network providers. . Discussed  patient picking up a more comprehensive list of in-network dental providers tomorrow at PCP office.  [place] [facility] Dentist [place], [place] [address] Phone: [phone]  [place], [place] [address] Phone: [phone] General Dentist  [facility], [facility] [place] General Dentist [address] [place], [place] [address] Phone: [phone]   Patient Self Care Activities:  . Schedule dental appointment with in network provider  Plan:  . Patient will contact dental provider and schedule appointment as soon as possible   Initial goal documentation         Telephone follow up appointment with CCM team member scheduled for: tomorrow  Malonie Tatum E. Rollene Rotunda, RN, BSN Nurse Care Coordinator [facility] Practice/[facility]N Care Management [phone]

## 2018-08-16 ENCOUNTER — Ambulatory Visit (INDEPENDENT_AMBULATORY_CARE_PROVIDER_SITE_OTHER): Payer: Medicare Other | Admitting: Physician Assistant

## 2018-08-16 ENCOUNTER — Encounter: Payer: Self-pay | Admitting: Physician Assistant

## 2018-08-16 VITALS — BP 119/69 | HR 93 | Temp 98.8°F | Resp 16 | Wt 215.0 lb

## 2018-08-16 DIAGNOSIS — J069 Acute upper respiratory infection, unspecified: Secondary | ICD-10-CM

## 2018-08-16 NOTE — Patient Instructions (Addendum)
Mucinex and delsym as needed Call if no improvements in 7-10 days

## 2018-08-16 NOTE — Progress Notes (Signed)
Patient: Alexis Lloyd Female    DOB: 05-26-1961   58 y.o.   MRN: [id number] Visit Date: 08/16/2018  Today's Provider: Mar Daring, PA-C   Chief Complaint  Patient presents with  . Urinary Tract Infection   Subjective:    I, Sulibeya S. Dimas, CMA, am acting as a Education administrator for [employer], PA-C.  HPI Upper Respiratory Infection: Patient complains of symptoms of a URI. Symptoms include runny nose. Onset of symptoms was 2 days ago, unchanged since that time. She also c/o left ear pressure/pain for the past 1 day .  She is drinking plenty of fluids. Evaluation to date: none. Treatment to date: antihistamines.  Wt Readings from Last 3 Encounters:  08/16/18 215 lb (97.5 kg)  06/16/18 219 lb (99.3 kg)  05/19/18 215 lb (97.5 kg)     Allergies  Allergen Reactions  . Allegra [Fexofenadine] Nausea And Vomiting  . Avelox [Moxifloxacin] Other (See Comments)    Hallucinations  . Bactrim [Sulfamethoxazole-Trimethoprim] Other (See Comments)    unknown  . Etodolac Nausea Only  . Fish-Derived Products Nausea And Vomiting    With "seafood"  . Penicillins Diarrhea and Nausea And Vomiting  . Shellfish Allergy Nausea Only  . Sulfa Antibiotics Other (See Comments)    unknown  . Macrobid [employer] Macro] Other (See Comments)    Constipation and globus hystericus.     Current Outpatient Medications:  .  albuterol (PROVENTIL HFA) 108 (90 Base) MCG/ACT inhaler, Inhale 2 puffs every 6 (six) hours as needed into the lungs., Disp: 3 Inhaler, Rfl: 1 .  Blood Glucose Monitoring Suppl (ONE TOUCH ULTRA SYSTEM KIT) w/Device KIT, To check blood sugar daily, Disp: 1 each, Rfl: 0 .  cetirizine (ZYRTEC) 10 MG tablet, Take 1 tablet (10 mg total) by mouth daily., Disp: 90 tablet, Rfl: 1 .  glucose blood (ONE TOUCH ULTRA TEST) test strip, USE TEST STRIP(S) TO CHECK BLLOD SUGAR DAILY, Disp: 100 each, Rfl: 12 .  ketoconazole (NIZORAL) 2 % cream, APPLY CREAM TOPICALLY TWICE DAILY  TO FEET, Disp: , Rfl: 2 .  Lancets (ONETOUCH ULTRASOFT) lancets, USE LANCET(S)  TO CHECK GLUCOSE DAILY, Disp: 100 each, Rfl: 12 .  lisinopril-hydrochlorothiazide (PRINZIDE,ZESTORETIC) 20-12.5 MG tablet, TAKE 2 TABLETS BY MOUTH ONCE DAILY, Disp: 180 tablet, Rfl: 1 .  metFORMIN (GLUCOPHAGE) 1000 MG tablet, Take 1 tablet (1,000 mg total) by mouth 2 (two) times daily with a meal., Disp: 180 tablet, Rfl: 1 .  montelukast (SINGULAIR) 10 MG tablet, TAKE 1 TABLET BY MOUTH ONCE DAILY, Disp: 30 tablet, Rfl: 11 .  sitaGLIPtin (JANUVIA) 100 MG tablet, Take 1 tablet (100 mg total) by mouth daily., Disp: 90 tablet, Rfl: 1 .  spironolactone (ALDACTONE) 25 MG tablet, TAKE 1 TABLET BY MOUTH ONCE DAILY, Disp: 90 tablet, Rfl: 1 .  verapamil (CALAN) 80 MG tablet, Take 1 tablet (80 mg total) by mouth 2 (two) times daily., Disp: 180 tablet, Rfl: 1  Review of Systems  Constitutional: Negative.   HENT: Positive for ear pain, rhinorrhea and sneezing.   Respiratory: Negative.   Cardiovascular: Negative.   Gastrointestinal: Negative.   Neurological: Negative.     Social History   Tobacco Use  . Smoking status: Former Smoker    Packs/day: 0.25    Years: 21.00    Pack years: 5.25    Last attempt to quit: 06/08/1997    Years since quitting: 21.2  . Smokeless tobacco: Never Used  Substance Use Topics  .  Alcohol use: No      Objective:   BP 119/69 (BP Location: Right Arm, Patient Position: Sitting, Cuff Size: Large)   Pulse 93   Temp 98.8 F (37.1 C)   Resp 16   Wt 215 lb (97.5 kg)   SpO2 98%   BMI 44.94 kg/m  Vitals:   08/16/18 1440  BP: 119/69  Pulse: 93  Resp: 16  Temp: 98.8 F (37.1 C)  SpO2: 98%  Weight: 215 lb (97.5 kg)     Physical Exam Vitals signs reviewed.  Constitutional:      General: She is not in acute distress.    Appearance: She is well-developed. She is not diaphoretic.  HENT:     Head: Normocephalic and atraumatic.     Right Ear: Hearing, tympanic membrane, ear canal  and external ear normal.     Left Ear: Hearing, tympanic membrane, ear canal and external ear normal.     Nose: Congestion present. No rhinorrhea.     Right Sinus: No maxillary sinus tenderness or frontal sinus tenderness.     Left Sinus: No maxillary sinus tenderness or frontal sinus tenderness.     Mouth/Throat:     Mouth: Mucous membranes are moist.     Pharynx: Uvula midline. No oropharyngeal exudate or posterior oropharyngeal erythema.  Neck:     Musculoskeletal: Normal range of motion and neck supple.     Thyroid: No thyromegaly.     Trachea: No tracheal deviation.  Cardiovascular:     Rate and Rhythm: Normal rate and regular rhythm.     Heart sounds: Normal heart sounds. No murmur. No friction rub. No gallop.   Pulmonary:     Effort: Pulmonary effort is normal. No respiratory distress.     Breath sounds: Normal breath sounds. No stridor. No wheezing or rales.  Lymphadenopathy:     Cervical: No cervical adenopathy.        Assessment & Plan    1. Viral upper respiratory tract infection Continue Zyrtec and Singulair. Use Mucinex and Delsym prn. Call if no improvements in 7-10 business days.      Mar Daring, PA-C  [facility]

## 2018-08-24 ENCOUNTER — Other Ambulatory Visit: Payer: Self-pay | Admitting: Physician Assistant

## 2018-08-24 DIAGNOSIS — E1165 Type 2 diabetes mellitus with hyperglycemia: Secondary | ICD-10-CM

## 2018-08-24 DIAGNOSIS — E1140 Type 2 diabetes mellitus with diabetic neuropathy, unspecified: Secondary | ICD-10-CM

## 2018-08-24 DIAGNOSIS — IMO0002 Reserved for concepts with insufficient information to code with codable children: Secondary | ICD-10-CM

## 2018-08-24 DIAGNOSIS — I10 Essential (primary) hypertension: Secondary | ICD-10-CM

## 2018-08-24 NOTE — Telephone Encounter (Signed)
Patient needs Januvia refilled to [facility].  She can get it free through this pharmacy.

## 2018-08-24 NOTE — Telephone Encounter (Signed)
Called pt back for clarification on pharmacy. Patient stated CCM pharmacist Alexis Lloyd has [facility]. Please advise refill?

## 2018-08-25 ENCOUNTER — Ambulatory Visit: Payer: Self-pay | Admitting: Pharmacist

## 2018-08-25 DIAGNOSIS — I10 Essential (primary) hypertension: Secondary | ICD-10-CM

## 2018-08-25 DIAGNOSIS — E119 Type 2 diabetes mellitus without complications: Secondary | ICD-10-CM

## 2018-08-25 MED ORDER — SITAGLIPTIN PHOSPHATE 100 MG PO TABS: 100.0000 mg | ORAL_TABLET | Freq: Every day | ORAL | 1 refills | Status: DC

## 2018-08-25 NOTE — Patient Instructions (Signed)
Goals Addressed            This Visit's Progress   . I need to reapply for copay assistance for Januvia (pt-stated)       Current Barriers:  . financial  Pharmacist Clinical Goal(s): Over the next 14 days, Alexis Lloyd will provide the necessary supplementary documents (proof of household income) needed for medication assistance applications to CCM pharmacist.   Interventions: . CCM pharmacist will apply for medication assistance program for Januvia made by [employer].   Patient Self Care Activities:  Marland Kitchen Gather necessary documents needed to apply for medication assistance  Initial goal documentation         The patient verbalized understanding of instructions provided today and declined a print copy of patient instruction materials.

## 2018-08-25 NOTE — Telephone Encounter (Signed)
Rx printed so it can be faxed or mailed to [facility].  We have to get information from [place].

## 2018-08-25 NOTE — Telephone Encounter (Signed)
Mrs. Selway is referring to the mail order pharmacy that [employer] [place]) uses for their medication assistance program.   Typically these programs expire on 12/31 or 1 year from submission. Under the media tab, it appears that Mrs. Langston applied to [employer] in January 2019 (per date on application. Uploaded into media tab 08/14/17).   It appears it is time to reapply for the program. I will set up an appointment with Mrs. Warmuth to do so.   Thanks!  Ruben Reason, PharmD Clinical Pharmacist [place] [phone]

## 2018-08-25 NOTE — Chronic Care Management (AMB) (Signed)
  Chronic Care Management   Note  08/25/2018 Name: Alexis Lloyd MRN: [id number] DOB: Jan 23, 1961  Referral from Fenton Malling, PA-C, regarding [place] for Alexis Lloyd. Successful outreach to patient, HIPAA identifiers verified.  Alexis Lloyd has previously applied to [employer] medication assistance program for [employer]. Past application uploaded under media tab. Alexis Lloyd is needing a refill on Januvia- she will need to resubmit for assistance for 2020.   Goals Addressed            This Visit's Progress   . I need to reapply for copay assistance for Januvia (pt-stated)       Current Barriers:  . financial  Pharmacist Clinical Goal(s): Over the next 14 days, Alexis Lloyd will provide the necessary supplementary documents (proof of household income) needed for medication assistance applications to CCM pharmacist.   Interventions: . CCM pharmacist will apply for medication assistance program for Januvia made by [employer].   Patient Self Care Activities:  Marland Kitchen Gather necessary documents needed to apply for medication assistance  Initial goal documentation          Follow up plan: Face to Face appointment with CCM team member scheduled foR: 2 weeks with pharmacist  Ruben Reason, PharmD Clinical Pharmacist [place] [phone]

## 2018-09-07 ENCOUNTER — Telehealth: Payer: Self-pay

## 2018-09-07 NOTE — Telephone Encounter (Signed)
Patient called back and states she spoke with a nurse this morning regarding Januvia. She is requesting the printed RX be sent to [facility]. She is also requesting a call back regarding appointment.

## 2018-09-09 NOTE — Telephone Encounter (Signed)
If she cannot do an evisit she can come in the office as long as she does not have cough or fever

## 2018-09-09 NOTE — Telephone Encounter (Signed)
Januvia form completed from my part. Patient needs to come pick up form and complete the first page then she can mail to [employer].

## 2018-09-09 NOTE — Telephone Encounter (Signed)
Pt called back this morning asking if her Januvia has been sent in to the pharmacy.  She said she has an appt on 4/10 and she does not have capability to do an e-visit and wants to come in to the office.  She was told she needed to do an e-visit  Please call patient back.    # [phone]  Thanks Con Memos

## 2018-09-09 NOTE — Telephone Encounter (Signed)
Patient was advised.  

## 2018-09-09 NOTE — Telephone Encounter (Signed)
Please advise refill? 

## 2018-09-16 ENCOUNTER — Encounter: Payer: Self-pay | Admitting: Physician Assistant

## 2018-09-16 ENCOUNTER — Other Ambulatory Visit: Payer: Self-pay

## 2018-09-16 ENCOUNTER — Ambulatory Visit (INDEPENDENT_AMBULATORY_CARE_PROVIDER_SITE_OTHER): Payer: Medicare Other | Admitting: Physician Assistant

## 2018-09-16 ENCOUNTER — Encounter: Payer: Self-pay | Admitting: Pharmacist

## 2018-09-16 VITALS — BP 137/79 | HR 65 | Temp 98.1°F | Resp 16 | Wt 217.8 lb

## 2018-09-16 DIAGNOSIS — E7800 Pure hypercholesterolemia, unspecified: Secondary | ICD-10-CM | POA: Diagnosis not present

## 2018-09-16 DIAGNOSIS — E119 Type 2 diabetes mellitus without complications: Secondary | ICD-10-CM

## 2018-09-16 DIAGNOSIS — I10 Essential (primary) hypertension: Secondary | ICD-10-CM | POA: Diagnosis not present

## 2018-09-16 DIAGNOSIS — Z1329 Encounter for screening for other suspected endocrine disorder: Secondary | ICD-10-CM | POA: Diagnosis not present

## 2018-09-16 NOTE — Progress Notes (Signed)
Patient: Alexis Lloyd Female    DOB: Sep 06, 1960   58 y.o.   MRN: [id number] Visit Date: 09/16/2018  Today's Provider: Mar Daring, PA-C   Chief Complaint  Patient presents with  . Follow-up    T2DM   Subjective:     HPI  Patient here today to follow-up on T2DM. She is gets help from CCM for Januvia. Form given to patient today.   Diabetes Mellitus Type II, Follow-up:   Lab Results  Component Value Date   HGBA1C 8.4 (A) 06/16/2018   HGBA1C 13.8 (A) 03/02/2018   HGBA1C 9.0 (H) 08/31/2017    Last seen for diabetes 3 months ago.  Management since then includes continue metformin and januvia. She reports excellent compliance with treatment. She is not having side effects.  Current symptoms include none and have been stable. Home blood sugar records: fasting range: 120-130  Episodes of hypoglycemia? no   Current Insulin Regimen: none Most Recent Eye Exam: UTD Weight trend: stable Current exercise: walking Current diet habits: well balanced  Pertinent Labs:    Component Value Date/Time   CHOL 164 08/31/2017 1050   TRIG 117 08/31/2017 1050   HDL 28 (L) 08/31/2017 1050   LDLCALC 113 (H) 08/31/2017 1050   CREATININE 0.98 05/19/2018 1610   CREATININE 0.75 04/14/2014 0925    Wt Readings from Last 3 Encounters:  09/16/18 217 lb 12.8 oz (98.8 kg)  08/16/18 215 lb (97.5 kg)  06/16/18 219 lb (99.3 kg)    ------------------------------------------------------------------------   Allergies  Allergen Reactions  . Allegra [Fexofenadine] Nausea And Vomiting  . Avelox [Moxifloxacin] Other (See Comments)    Hallucinations  . Bactrim [Sulfamethoxazole-Trimethoprim] Other (See Comments)    unknown  . Etodolac Nausea Only  . Fish-Derived Products Nausea And Vomiting    With "seafood"  . Penicillins Diarrhea and Nausea And Vomiting  . Shellfish Allergy Nausea Only  . Sulfa Antibiotics Other (See Comments)    unknown  . Macrobid [employer]  Macro] Other (See Comments)    Constipation and globus hystericus.     Current Outpatient Medications:  .  albuterol (PROVENTIL HFA) 108 (90 Base) MCG/ACT inhaler, Inhale 2 puffs every 6 (six) hours as needed into the lungs., Disp: 3 Inhaler, Rfl: 1 .  Blood Glucose Monitoring Suppl (ONE TOUCH ULTRA SYSTEM KIT) w/Device KIT, To check blood sugar daily, Disp: 1 each, Rfl: 0 .  cetirizine (ZYRTEC) 10 MG tablet, Take 1 tablet (10 mg total) by mouth daily., Disp: 90 tablet, Rfl: 1 .  glucose blood (ONE TOUCH ULTRA TEST) test strip, USE TEST STRIP(S) TO CHECK BLLOD SUGAR DAILY, Disp: 100 each, Rfl: 12 .  ketoconazole (NIZORAL) 2 % cream, APPLY CREAM TOPICALLY TWICE DAILY TO FEET, Disp: , Rfl: 2 .  Lancets (ONETOUCH ULTRASOFT) lancets, USE LANCET(S)  TO CHECK GLUCOSE DAILY, Disp: 100 each, Rfl: 12 .  lisinopril-hydrochlorothiazide (PRINZIDE,ZESTORETIC) 20-12.5 MG tablet, TAKE 2 TABLETS BY MOUTH ONCE DAILY, Disp: 180 tablet, Rfl: 1 .  metFORMIN (GLUCOPHAGE) 1000 MG tablet, TAKE 1 TABLET BY MOUTH TWICE DAILY WITH A MEAL, Disp: 180 tablet, Rfl: 1 .  montelukast (SINGULAIR) 10 MG tablet, TAKE 1 TABLET BY MOUTH ONCE DAILY, Disp: 30 tablet, Rfl: 11 .  sitaGLIPtin (JANUVIA) 100 MG tablet, Take 1 tablet (100 mg total) by mouth daily., Disp: 90 tablet, Rfl: 1 .  spironolactone (ALDACTONE) 25 MG tablet, TAKE 1 TABLET BY MOUTH ONCE DAILY, Disp: 90 tablet, Rfl: 1 .  verapamil (CALAN)  80 MG tablet, Take 1 tablet by mouth twice daily, Disp: 180 tablet, Rfl: 1  Review of Systems  Constitutional: Negative.   Respiratory: Negative.   Cardiovascular: Negative.   Gastrointestinal: Negative.   Genitourinary: Negative.   Musculoskeletal: Negative.   Neurological: Negative.     Social History   Tobacco Use  . Smoking status: Former Smoker    Packs/day: 0.25    Years: 21.00    Pack years: 5.25    Last attempt to quit: 06/08/1997    Years since quitting: 21.2  . Smokeless tobacco: Never Used  Substance Use  Topics  . Alcohol use: No      Objective:   BP 137/79 (BP Location: Left Arm, Patient Position: Sitting, Cuff Size: Large)   Pulse 65   Temp 98.1 F (36.7 C) (Oral)   Resp 16   Wt 217 lb 12.8 oz (98.8 kg)   BMI 45.52 kg/m  Vitals:   09/16/18 1049  BP: 137/79  Pulse: 65  Resp: 16  Temp: 98.1 F (36.7 C)  TempSrc: Oral  Weight: 217 lb 12.8 oz (98.8 kg)     Physical Exam Vitals signs reviewed.  Constitutional:      General: She is not in acute distress.    Appearance: Normal appearance. She is well-developed. She is obese. She is not ill-appearing or diaphoretic.  Neck:     Musculoskeletal: Normal range of motion and neck supple.     Thyroid: No thyromegaly.     Vascular: No JVD.     Trachea: No tracheal deviation.  Cardiovascular:     Rate and Rhythm: Normal rate and regular rhythm.     Heart sounds: Normal heart sounds. No murmur. No friction rub. No gallop.   Pulmonary:     Effort: Pulmonary effort is normal. No respiratory distress.     Breath sounds: Normal breath sounds. No wheezing or rales.  Lymphadenopathy:     Cervical: No cervical adenopathy.  Neurological:     Mental Status: She is alert.  Psychiatric:        Mood and Affect: Mood normal.        Behavior: Behavior normal.        Thought Content: Thought content normal.        Judgment: Judgment normal.         Assessment & Plan    1. Screening for thyroid disorder Will check labs as below and f/u pending results. - TSH  2. Type 2 diabetes mellitus without complication, without long-term current use of insulin ([place]) Had been doing better at last check. Continue metformin 1010m BID and Januvia 1055mdaily. Will check labs as below and f/u pending results. - Hemoglobin A1c  3. Essential hypertension Stable. Continue verapamil 8043mID, Spironolactone 22m58mily, lisinopril-hctz 20-12.5mg.68mll check labs as below and f/u pending results. - CBC with Differential/Platelet - Comprehensive  metabolic panel  4. Hypercholesteremia Stable. Diet controlled. Patient refused statins in the past. Will check labs as below and f/u pending results. - CBC with Differential/Platelet - Comprehensive metabolic panel - Lipid panel     JenniMar DaringC  [facility]

## 2018-09-16 NOTE — Telephone Encounter (Signed)
This encounter was created in error - please disregard.

## 2018-09-17 ENCOUNTER — Ambulatory Visit: Payer: Medicare Other | Admitting: Physician Assistant

## 2018-09-17 LAB — COMPREHENSIVE METABOLIC PANEL
ALT: 19 IU/L (ref 0–32)
AST: 7 IU/L (ref 0–40)
Albumin/Globulin Ratio: 1.4 (ref 1.2–2.2)
Albumin: 4 g/dL (ref 3.8–4.9)
Alkaline Phosphatase: 82 IU/L (ref 39–117)
BUN/Creatinine Ratio: 14 (ref 9–23)
BUN: 10 mg/dL (ref 6–24)
Bilirubin Total: 0.4 mg/dL (ref 0.0–1.2)
CO2: 23 mmol/L (ref 20–29)
Calcium: 10 mg/dL (ref 8.7–10.2)
Chloride: 98 mmol/L (ref 96–106)
Creatinine, Ser: 0.72 mg/dL (ref 0.57–1.00)
GFR calc Af Amer: 106 mL/min/{1.73_m2} (ref >59)
GFR calc non Af Amer: 92 mL/min/{1.73_m2} (ref >59)
Globulin, Total: 2.9 g/dL (ref 1.5–4.5)
Glucose: 153 mg/dL — ABNORMAL HIGH (ref 65–99)
Potassium: 4.2 mmol/L (ref 3.5–5.2)
Sodium: 140 mmol/L (ref 134–144)
Total Protein: 6.9 g/dL (ref 6.0–8.5)

## 2018-09-17 LAB — CBC WITH DIFFERENTIAL/PLATELET
Basophils Absolute: 0 10*3/uL (ref 0.0–0.2)
Basos: 1 %
EOS (ABSOLUTE): 0 10*3/uL (ref 0.0–0.4)
Eos: 1 %
Hematocrit: 34.8 % (ref 34.0–46.6)
Hemoglobin: 11.5 g/dL (ref 11.1–15.9)
Immature Grans (Abs): 0 10*3/uL (ref 0.0–0.1)
Immature Granulocytes: 0 %
Lymphocytes Absolute: 2.8 10*3/uL (ref 0.7–3.1)
Lymphs: 39 %
MCH: 27.9 pg (ref 26.6–33.0)
MCHC: 33 g/dL (ref 31.5–35.7)
MCV: 85 fL (ref 79–97)
Monocytes Absolute: 0.4 10*3/uL (ref 0.1–0.9)
Monocytes: 5 %
Neutrophils Absolute: 4 10*3/uL (ref 1.4–7.0)
Neutrophils: 54 %
Platelets: 278 10*3/uL (ref 150–450)
RBC: 4.12 x10E6/uL (ref 3.77–5.28)
RDW: 13.3 % (ref 11.7–15.4)
WBC: 7.2 10*3/uL (ref 3.4–10.8)

## 2018-09-17 LAB — HEMOGLOBIN A1C
Est. average glucose Bld gHb Est-mCnc: 209 mg/dL
Hgb A1c MFr Bld: 8.9 % — ABNORMAL HIGH (ref 4.8–5.6)

## 2018-09-17 LAB — LIPID PANEL
Chol/HDL Ratio: 7.1 ratio — ABNORMAL HIGH (ref 0.0–4.4)
Cholesterol, Total: 228 mg/dL — ABNORMAL HIGH (ref 100–199)
HDL: 32 mg/dL — ABNORMAL LOW (ref >39)
LDL Calculated: 163 mg/dL — ABNORMAL HIGH (ref 0–99)
Triglycerides: 166 mg/dL — ABNORMAL HIGH (ref 0–149)
VLDL Cholesterol Cal: 33 mg/dL (ref 5–40)

## 2018-09-17 LAB — TSH: TSH: 2.19 u[IU]/mL (ref 0.450–4.500)

## 2018-09-20 ENCOUNTER — Telehealth: Payer: Self-pay

## 2018-09-20 NOTE — Telephone Encounter (Signed)
-----   Message from Mar Daring, [place] sent at 09/20/2018  8:28 AM EDT ----- Blood count is normal. Kidney and liver function are normal. Sodium, potassium and calcium are normal. A1c did increase to 8.9 from 8.4. Keep working on limiting carbohydrates and sugars from diet. Continue all medications as prescribed. Cholesterol also up compared to last 4 years. I would recommend [place] start a cholesterol lowering medication to protect you from having a cardiovascular event (I.e. heart attack or stroke). Thyroid is normal.

## 2018-09-20 NOTE — Telephone Encounter (Signed)
Patient advised as directed below. Per patient if the labs on her next follow-up appointments are not any better, she will agree to the lowering cholesterol medication. For now she is going to work on her diet.

## 2018-09-20 NOTE — Telephone Encounter (Signed)
Noted  

## 2018-09-22 ENCOUNTER — Ambulatory Visit: Payer: Self-pay

## 2018-09-22 DIAGNOSIS — E7800 Pure hypercholesterolemia, unspecified: Secondary | ICD-10-CM

## 2018-09-22 DIAGNOSIS — G4733 Obstructive sleep apnea (adult) (pediatric): Secondary | ICD-10-CM

## 2018-09-22 DIAGNOSIS — E119 Type 2 diabetes mellitus without complications: Secondary | ICD-10-CM

## 2018-09-22 DIAGNOSIS — I10 Essential (primary) hypertension: Secondary | ICD-10-CM

## 2018-09-22 NOTE — Chronic Care Management (AMB) (Signed)
   Chronic Care Management   Unsuccessful Call Note 09/22/2018 Name: Alexis Lloyd MRN: [id number] DOB: 08-25-1960   Alexis Lloyd is a 58 year old female primary care patient of Fenton Malling PA-C who was referred to the CCM program for assistance with medication management/assistance and DM/HTN disease management.Follow up phone call today to address progression towards established goals and to assess for additional goals.Last office visit with Alexis Daring, PA-C was 08/16/2018. At this appointment Alexis Lloyd A1C had increased from 8.4 to 8.9. Today CCM Alexis Lloyd CM attempted to follow up with Alexis Lloyd to discuss recent lab and identify additional education needed or barriers to following DM plan of care.     Was unable to reach patient via telephone. I have left HIPAA compliant voicemail asking patient to return my call. (unsuccessful outreach #1).   Plan: Will follow-up within 7 business days via telephone.   Alexis Wiatrek E. Rollene Rotunda, Alexis Lloyd, Alexis Lloyd Nurse Care Coordinator [facility] Care Management [phone]

## 2018-09-22 NOTE — Chronic Care Management (AMB) (Signed)
  Chronic Care Management   Note  09/22/2018 Name: Alexis Lloyd MRN: [id number] DOB: [birth year]  Care Coordination: Incoming call from [name] in response to CCM RN CM left message. [name] is agreeable to schedule a follow up telephone assessment next week to discuss worsening of A1C and increased cholesterol. She admits to reverting back to "old behaviors" just prior to and during Covid-19 restrictions. She admits to needing help motivation to follow diabetes plan of care.   Telephone follow up appointment with CCM team member scheduled for: 09/29/2018 at 3:00  Jordyne Poehlman E. Rollene Rotunda, RN, BSN Nurse Care Coordinator [facility] [phone]

## 2018-09-27 ENCOUNTER — Telehealth: Payer: Self-pay

## 2018-09-28 ENCOUNTER — Ambulatory Visit: Payer: Self-pay | Admitting: Pharmacist

## 2018-09-28 DIAGNOSIS — I10 Essential (primary) hypertension: Secondary | ICD-10-CM

## 2018-09-28 DIAGNOSIS — E119 Type 2 diabetes mellitus without complications: Secondary | ICD-10-CM

## 2018-09-28 DIAGNOSIS — E7800 Pure hypercholesterolemia, unspecified: Secondary | ICD-10-CM

## 2018-09-29 ENCOUNTER — Ambulatory Visit (INDEPENDENT_AMBULATORY_CARE_PROVIDER_SITE_OTHER): Payer: Medicare Other

## 2018-09-29 ENCOUNTER — Other Ambulatory Visit: Payer: Self-pay

## 2018-09-29 DIAGNOSIS — E119 Type 2 diabetes mellitus without complications: Secondary | ICD-10-CM | POA: Diagnosis not present

## 2018-09-29 DIAGNOSIS — I10 Essential (primary) hypertension: Secondary | ICD-10-CM

## 2018-09-29 NOTE — Chronic Care Management (AMB) (Signed)
Chronic Care Management   Follow Up Note   09/29/2018 Name: INDIE BOEHNE MRN: [id number] DOB: [birth year]  Referred by: Mar Daring, PA-C Reason for referral : Chronic Care Management (follow up DM/A1C going up)   Subjective: "I am just not motivated to get out" "I think this stay at home stuff has got me down and when I am down I just eat" "I just need someone to keep encouraging me to stay on track"   Objective:  Lab Results  Component Value Date   HGBA1C 8.9 (H) 09/16/2018   HGBA1C 8.4 (A) 06/16/2018   HGBA1C 13.8 (A) 03/02/2018    Assessment: Mrs. Vandekamp is a 58 year old female primary care patient of Fenton Malling PA-C who was referred to the CCM program for assistance with medication management/assistance and DM/HTN disease management.Follow up phone call today to discuss recent increase in A1C and to discuss barriers to self care related to her diabetes.Last office visit with Mar Daring, PA-C was 09/16/2018.  Review of patient status, including review of consultants reports, relevant laboratory and other test results, and collaboration with appropriate care team members and the patient's provider was performed as part of comprehensive patient evaluation and provision of chronic care management services.    Goals Addressed            This Visit's Progress   . "I want to get my A1C down to 7" (pt-stated)       Ms. Mayden is very discuraged that her A1C went up instead of down at her most recent visit. States she is struggling with the stay at home order related to COVID-19. States she is very much a social person and she feels as though she is in a "rutt" right now. She states she has a history of mild depression and the more she is unable to socialize with family and friends, the more down she feel. She copes with her symptoms by eating.   She reports fasting for Malena Edman. She acknowledges that after her fast, she began to eat whatever she wanted and  did not watch portion size. She feels she can get back on track with motivation from CCM RN CM and frequent check-ins. She also feels she would benefit from engaging with [place] to discuss alternative ways to deal with her situational stress and depression.  She admits to taking her medication as prescribed but she is not as active as she was during the time she reduced her A1C from 13.8 to 8.4  Current Barriers:   Covid-19 restrictions  Mild depression symptoms secondary to Covid-19 restrictions  Lack of social support from family  Nurse Case Manager Clinical Goal(s):   Over the next 3 months patient will lower her A1C to 7 through diet, exercise, medication compliance  Interventions:   Assessed for continued compliance with DM self care/management plan of care . Referral to [facility] for possible counseling secondary to depression symptoms. . Reinforced education previously learned including importance of medication compliance, daily exercise, glucose monitoring, and adherence to ADA diet . Discussed importance of outdoor activities as a way of improving mental health and decrease stress during this pandemic . Provided additional dietary information on Carb Counting   Patient Self Care Activities:  . Take medications as prescribed . Check sugars daily as directed an record . Exercise daily (walking) . Adheres to ADA diet and portion cotrol  Please see past updates related to this goal by clicking on the "  Past Updates" button in the selected goal           Telephone follow up appointment with CCM team member scheduled for:2 weeks   Diannah Rindfleisch E. Rollene Rotunda, RN, BSN Nurse Care Coordinator [facility] [phone]

## 2018-09-29 NOTE — Chronic Care Management (AMB) (Signed)
  Chronic Care Management   Follow Up Note   09/29/2018 Name: SAPHIRA LAHMANN MRN: [id number] DOB: Jan 04, 1961  Referred by: Mar Daring, PA-C Reason for referral : Chronic Care Management (Medication assistance)   TAMELA ELSAYED is a 58 y.o. year old female who is a primary care patient of Rubye Beach. The CCM team was consulted for assistance with chronic disease management and care coordination needs.    Review of patient status, including review of consultants reports, relevant laboratory and other test results, and collaboration with appropriate care team members and the patient's provider was performed as part of comprehensive patient evaluation and provision of chronic care management services.    Assessment:  #Medication assistance: [employer] Acupuncturist states that Ms. Sevillano's application was received, but returned to her via mail because it was missing provider signature.   Goals Addressed            This Visit's Progress   . I need to reapply for copay assistance for Januvia (pt-stated)       Current Barriers:  . financial  Pharmacist Clinical Goal(s): Over the next 14 days, Ms.Mariea Clonts will provide the necessary supplementary documents (proof of household income) needed for medication assistance applications to CCM pharmacist.   Interventions: . CCM pharmacist will apply for medication assistance program for Januvia made by [employer].  Marland Kitchen Updated 09/28/18: [employer] representative states that patient's application was mailed back to her because provider did not sign application. We can either reapply with a new application OR have Sonia Baller sign the returned application and resubmit.   Patient Self Care Activities:  Marland Kitchen Monitor mail for correspondence from [employer]. Please bring this mail to [facility] for Nechama Guard to sign.   Please see past updates related to this goal by clicking on the "Past Updates" button in the selected goal           Plan: We can submit new application or have Sonia Baller sign the pages of the returned application and resubmit.   Telephone follow up appointment with CCM team member scheduled for: 3-5 days   Ruben Reason, PharmD Clinical Pharmacist [place] [phone]

## 2018-09-29 NOTE — Patient Instructions (Addendum)
Thank you allowing the Chronic Care Management Team to be a part of your care! It was a pleasure speaking with you today!  1. On average, people with diabetes should get about 45% of their calories from carbs. A carb serving is measured as 15 grams per serving. That means most women need 3 to 4 carb servings (45-60 grams) per meal, while most men need about 4 to 5 carb servings (60-75 grams).       Diabetes and Carbs ? Eat Well with Diabetes  CDC       [email or web address] 2. Please engage with [place] for situational depression management 3. Continue to check your blood sugars daily. Your fasting readings are great which makes me concerned about what your after meal sugars are doing. 4. Please review the information below about ADA diet carb counting and we will discuss in two weeks  CCM (Chronic Care Management) Team   Trish Fountain RN, BSN Nurse Care Coordinator  [phone]  Ruben Reason PharmD  Clinical Pharmacist  [phone]   [place], LCSW Clinical Social Worker [phone]  Goals Addressed            This Visit's Progress   . "I want to get my A1C down to 7" (pt-stated)       Current Barriers:   Covid-19 restrictions  Mild depression symptoms secondary to Covid-19 restrictions  Lack of social support from family  Nurse Case Manager Clinical Goal(s):   Over the next 3 months patient will lower her A1C to 7 through diet, exercise, medication compliance  Interventions:   Assessed for continued compliance with DM self care/management plan of care . Referral to [facility] for possible counseling secondary to depression symptoms. . Reinforced education previously learned including importance of medication compliance, daily exercise, glucose monitoring, and adherence to ADA diet . Discussed importance of outdoor activities as a way of improving mental health  and decrease stress during this pandemic . Provided additional dietary information on Carb Counting   Patient Self Care Activities:  . Take medications as prescribed . Check sugars daily as directed an record . Exercise daily (walking) . Adheres to ADA diet and portion cotrol  Please see past updates related to this goal by clicking on the "Past Updates" button in the selected goal          Print copy of patient instructions provided.   Telephone follow up appointment with CCM team member scheduled for: 2 weeks   Carbohydrate Counting for Diabetes Mellitus, Adult  Carbohydrate counting is a method of keeping track of how many carbohydrates you eat. Eating carbohydrates naturally increases the amount of sugar (glucose) in the blood. Counting how many carbohydrates you eat helps keep your blood glucose within normal limits, which helps you manage your diabetes (diabetes mellitus). It is important to know how many carbohydrates you can safely have in each meal. This is different for every person. A diet and nutrition specialist (registered dietitian) can help you make a meal plan and calculate how many carbohydrates you should have at each meal and snack. Carbohydrates are found in the following foods:  Grains, such as breads and cereals.  Dried beans and soy products.  Starchy vegetables, such as potatoes, peas, and corn.  Fruit and fruit juices.  Milk and yogurt.  Sweets and snack foods, such as cake, cookies, candy, chips, and soft drinks. How do I count carbohydrates? There are two ways to count carbohydrates  in food. You can use either of the methods or a combination of both. Reading "Nutrition Facts" on packaged food The "Nutrition Facts" list is included on the labels of almost all packaged foods and beverages in the [place] It includes:  The serving size.  Information about nutrients in each serving, including the grams (g) of carbohydrate per serving. To use the  "Nutrition Facts":  Decide how many servings you will have.  Multiply the number of servings by the number of carbohydrates per serving.  The resulting number is the total amount of carbohydrates that you will be having. Learning standard serving sizes of other foods When you eat carbohydrate foods that are not packaged or do not include "Nutrition Facts" on the label, you need to measure the servings in order to count the amount of carbohydrates:  Measure the foods that you will eat with a food scale or measuring cup, if needed.  Decide how many standard-size servings you will eat.  Multiply the number of servings by 15. Most carbohydrate-rich foods have about 15 g of carbohydrates per serving. ? For example, if you eat 8 oz (170 g) of strawberries, you will have eaten 2 servings and 30 g of carbohydrates (2 servings x 15 g = 30 g).  For foods that have more than one food mixed, such as soups and casseroles, you must count the carbohydrates in each food that is included. The following list contains standard serving sizes of common carbohydrate-rich foods. Each of these servings has about 15 g of carbohydrates:   hamburger bun or  English muffin.   oz (15 mL) syrup.   oz (14 g) jelly.  1 slice of bread.  1 six-inch tortilla.  3 oz (85 g) cooked rice or pasta.  4 oz (113 g) cooked dried beans.  4 oz (113 g) starchy vegetable, such as peas, corn, or potatoes.  4 oz (113 g) hot cereal.  4 oz (113 g) mashed potatoes or  of a large baked potato.  4 oz (113 g) canned or frozen fruit.  4 oz (120 mL) fruit juice.  4-6 crackers.  6 chicken nuggets.  6 oz (170 g) unsweetened dry cereal.  6 oz (170 g) plain fat-free yogurt or yogurt sweetened with artificial sweeteners.  8 oz (240 mL) milk.  8 oz (170 g) fresh fruit or one small piece of fruit.  24 oz (680 g) popped popcorn. Example of carbohydrate counting Sample meal  3 oz (85 g) chicken breast.  6 oz (170 g)  brown rice.  4 oz (113 g) corn.  8 oz (240 mL) milk.  8 oz (170 g) strawberries with sugar-free whipped topping. Carbohydrate calculation 1. Identify the foods that contain carbohydrates: ? Rice. ? Corn. ? Milk. ? Strawberries. 2. Calculate how many servings you have of each food: ? 2 servings rice. ? 1 serving corn. ? 1 serving milk. ? 1 serving strawberries. 3. Multiply each number of servings by 15 g: ? 2 servings rice x 15 g = 30 g. ? 1 serving corn x 15 g = 15 g. ? 1 serving milk x 15 g = 15 g. ? 1 serving strawberries x 15 g = 15 g. 4. Add together all of the amounts to find the total grams of carbohydrates eaten: ? 30 g + 15 g + 15 g + 15 g = 75 g of carbohydrates total. Summary  Carbohydrate counting is a method of keeping track of how many carbohydrates you eat.  Eating carbohydrates naturally increases the amount of sugar (glucose) in the blood.  Counting how many carbohydrates you eat helps keep your blood glucose within normal limits, which helps you manage your diabetes.  A diet and nutrition specialist (registered dietitian) can help you make a meal plan and calculate how many carbohydrates you should have at each meal and snack. This information is not intended to replace advice given to you by your health care provider. Make sure you discuss any questions you have with your health care provider. Document Released: 05/26/2005 Document Revised: 12/03/2016 Document Reviewed: 11/07/2015 Elsevier Interactive Patient Education  2019 [employer].

## 2018-10-04 ENCOUNTER — Telehealth: Payer: Self-pay

## 2018-10-04 ENCOUNTER — Ambulatory Visit: Payer: Self-pay | Admitting: *Deleted

## 2018-10-04 DIAGNOSIS — E119 Type 2 diabetes mellitus without complications: Secondary | ICD-10-CM | POA: Diagnosis not present

## 2018-10-04 DIAGNOSIS — I10 Essential (primary) hypertension: Secondary | ICD-10-CM | POA: Diagnosis not present

## 2018-10-05 NOTE — Patient Instructions (Signed)
Thank you allowing the Chronic Care Management Team to be a part of your care! It was a pleasure speaking with you today!  1. Please continue to utilize desired self care strategies. 2. Please feel free to call this social worker with questions or concerns     CCM (Chronic Care Management) Team   Trish Fountain RN, BSN Nurse Care Coordinator  [phone]  Ruben Reason PharmD  Clinical Pharmacist  [phone]   [place], [place] Social Worker [phone]  Goals Addressed            This Visit's Progress   . "I would like to make sure I get my medication" (pt-stated)       Current Barriers:  . Financial constraints . Mental Health Concerns   Clinical Social Work Clinical Goal(s):  Marland Kitchen Over the next 30 days, client will work with SW to address concerns related to 30 . Over the next 30 days, patient will work with CCM Pharmacist to address needs related to obtaining her medications-specifically her Januvia . Discussed positive coping strategies for stress and depression- starting the day with gratitude, establishing a routine, getting enough sleep, and watching what she eats    Interventions: . Patient interviewed and appropriate assessments performed . Provided mental health counseling with regard to depression (mental health diagnosis or concern) . Collaborated with CCM Pharmacist to discuss patient's concern regarding her Januvia. The CCM Pharmacist was already aware of this concern and plans to have patient drop off the Januvia application for patient's doctor to sign. The CCM Pharmacist  will send it to the company for processing once signature is received.  .   Patient Self Care Activities:  . Self administers medications as prescribed . Attends all scheduled provider appointments . Attends church or other social activities . Performs ADL's independently . Performs IADL's independently . Calls provider office for new concerns or  questions  Initial goal documentation         The patient verbalized understanding of instructions provided today and declined a print copy of patient instruction materials.   The CM team will reach out to the patient again over the next 14 days.

## 2018-10-05 NOTE — Chronic Care Management (AMB) (Signed)
.  ccm  Chronic Care Management    Clinical Social Work General Note  10/05/2018 Name: Alexis Lloyd MRN: [id number] DOB: [birth year]  Alexis Lloyd is a 58 y.o. year old female who is a primary care patient of Alexis Lloyd. The CCM was consulted to assist the patient with Mental Health Counseling and Resources.  Review of patient status, including review of consultants reports, relevant laboratory and other test results, and collaboration with appropriate care team members and the patient's provider was performed as part of comprehensive patient evaluation and provision of chronic care management services.     Goals Addressed            This Visit's Progress   . "I would like to make sure I get my medication" (pt-stated)       This social worker contacted patient by phone today, who initially discussed  concern that her Januvia was running low. Patient appreciative following collaboration call with CCM Pharmacist.  Patient discussed feelings of isolation and depression which is causing her to return to unhealthy eating habits. Emotional support provide, positive coping explored.    Current Barriers:  . Financial constraints . Mental Health Concerns   Clinical Social Work Clinical Goal(s):  Marland Kitchen Over the next 30 days, client will work with SW to address concerns related to 52 . Over the next 30 days, patient will work with CCM Pharmacist to address needs related to obtaining her medications-specifically her Januvia   Interventions: . Patient interviewed and appropriate assessments performed . Provided mental health counseling with regard to depression (mental health diagnosis or concern) . Discussed positive coping strategies for stress and depression I.e. starting the day with gratitude, establishing a routine, getting enough sleep, and watching what she eats  . Collaborated with CCM Pharmacist to discuss patient's concern regarding her Januvia. The CCM Pharmacist was  already aware of this concern and plans to have patient drop off the Januvia application for patient's doctor to sign. The CCM Pharmacist  will send it to the company for processing once signature is received.  .   Patient Self Care Activities:  . Self administers medications as prescribed . Attends all scheduled provider appointments . Attends church or other social activities . Performs ADL's independently . Performs IADL's independently . Calls provider office for new concerns or questions  Initial goal documentation         Follow Up Plan: SW will follow up with patient by phone over the next 2 weeks       [place], [place] Worker  [facility] Care Management [phone]

## 2018-10-06 ENCOUNTER — Ambulatory Visit: Payer: Self-pay | Admitting: Pharmacist

## 2018-10-06 DIAGNOSIS — E119 Type 2 diabetes mellitus without complications: Secondary | ICD-10-CM

## 2018-10-06 DIAGNOSIS — I10 Essential (primary) hypertension: Secondary | ICD-10-CM

## 2018-10-06 DIAGNOSIS — E7800 Pure hypercholesterolemia, unspecified: Secondary | ICD-10-CM

## 2018-10-06 NOTE — Chronic Care Management (AMB) (Signed)
Chronic Care Management   Follow Up Note   10/06/2018 Name: Alexis Lloyd MRN: [id number] DOB: 1960/06/29  Referred by: Mar Daring, PA-C Reason for referral : Chronic Care Management ([employer] follow up)   Alexis Lloyd is a 58 y.o. year old female who is a primary care patient of Rubye Beach. The CCM team was consulted for assistance with chronic disease management and care coordination needs.    Review of patient status, including review of consultants reports, relevant laboratory and other test results, and collaboration with appropriate care team members and the patient's provider was performed as part of comprehensive patient evaluation and provision of chronic care management services.    Goals    . "I want to get my A1C down to 7" (pt-stated)     Current Barriers:   Covid-19 restrictions  Mild depression symptoms secondary to Covid-19 restrictions  Lack of social support from family  Nurse Case Manager Clinical Goal(s):   Over the next 3 months patient will lower her A1C to 7 through diet, exercise, medication compliance  Interventions:   Assessed for continued compliance with DM self care/management plan of care . Referral to [place] for possible counseling secondary to depression symptoms. . Reinforced education previously learned including importance of medication compliance, daily exercise, glucose monitoring, and adherence to ADA diet . Discussed importance of outdoor activities as a way of improving mental health and decrease stress during this pandemic . Provided additional dietary information on Carb Counting   Patient Self Care Activities:  . Take medications as prescribed . Check sugars daily as directed an record . Exercise daily (walking) . Adheres to ADA diet and portion cotrol  Please see past updates related to this goal by clicking on the "Past Updates" button in the selected goal       . "I would like to make sure  I get my medication" (pt-stated)     Current Barriers:  . Financial constraints . Mental Health Concerns   Clinical Social Work Clinical Goal(s):  Marland Kitchen Over the next 30 days, client will work with SW to address concerns related to 72 . Over the next 30 days, patient will work with CCM Pharmacist to address needs related to obtaining her medications-specifically her Januvia . Discussed positive coping strategies for stress and depression- starting the day with gratitude, establishing a routine, getting enough sleep, and watching what she eats    Interventions: . Patient interviewed and appropriate assessments performed . Provided mental health counseling with regard to depression (mental health diagnosis or concern) . Collaborated with CCM Pharmacist to discuss patient's concern regarding her Januvia. The CCM Pharmacist was already aware of this concern and plans to have patient drop off the Januvia application for patient's doctor to sign. The CCM Pharmacist  will send it to the company for processing once signature is received.  .   Patient Self Care Activities:  . Self administers medications as prescribed . Attends all scheduled provider appointments . Attends church or other social activities . Performs ADL's independently . Performs IADL's independently . Calls provider office for new concerns or questions  Initial goal documentation     . I have questions about statin medications (pt-stated)     Current Barriers:  Marland Kitchen Knowledge deficit related to purpose and mechanism of medications taken for elevated cholesterol  Pharmacist Clinical Goal(s): Over the 30 days, Ms.Mariea Clonts will verbalize understanding of medication plan as it relates to his plan of care.  Interventions: . Patient educated on purpose, proper use and potential adverse effects of statin medications   . CCM pharmacist answered patient's questions and concerns regarding medication for cholesterol . CCM pharmacist  reviewed patient's increased risk of heart attack and stroke given recent lipid panel and diabetes  Patient Self Care Activities:  . Take all medications as prescribed   Initial goal documentation     . I need to reapply for copay assistance for Januvia (pt-stated)     Current Barriers:  . financial  Pharmacist Clinical Goal(s): Over the next 14 days, Ms.Mariea Clonts will provide the necessary supplementary documents (proof of household income) needed for medication assistance applications to CCM pharmacist.   Interventions: . CCM pharmacist will apply for medication assistance program for Januvia made by [employer].  Marland Kitchen Updated 09/28/18: [employer] representative states that patient's application was mailed back to her because provider did not sign application. We can either reapply with a new application OR have Sonia Baller sign the returned application and resubmit.   Patient Self Care Activities:  Marland Kitchen Monitor mail for correspondence from [employer]. Please bring this mail to [facility] for Nechama Guard to sign.   Please see past updates related to this goal by clicking on the "Past Updates" button in the selected goal       . I really need to see a dentist (pt-stated)     Current Barriers:  Marland Kitchen Knowledge Deficits related to how to locate a dental provider within [place] . Film/video editor.   Nurse Case Manager Clinical Goal(s):  Marland Kitchen Over the next 7 days, patient will verbalize understanding of plan for obtaining a [facility] dental provider withing the [place] area  . Over the next 7 days, patient will contact resources provided today for dental care  Interventions:  . Provided patient with [facility] in-network providers. . Discussed patient picking up a more comprehensive list of in-network dental providers tomorrow at PCP office.  [place] [facility] Dentist [place], [place] [address] Phone: [phone]  [place]  [place], [place] [address] Phone: [phone] General Dentist  [place], [facility] [address]ressa [place] [place], [place] [address] Phone: [phone]   Patient Self Care Activities:  . Schedule dental appointment with in network provider  Plan:  . Patient will contact dental provider and schedule appointment as soon as possible   Initial goal documentation        The CM team will reach out to the patient again over the next 3 days.   Ruben Reason, PharmD Clinical Pharmacist [place] [phone]

## 2018-10-08 ENCOUNTER — Ambulatory Visit: Payer: Self-pay | Admitting: Pharmacist

## 2018-10-08 DIAGNOSIS — E7800 Pure hypercholesterolemia, unspecified: Secondary | ICD-10-CM

## 2018-10-08 DIAGNOSIS — E119 Type 2 diabetes mellitus without complications: Secondary | ICD-10-CM

## 2018-10-08 NOTE — Chronic Care Management (AMB) (Signed)
  Chronic Care Management   Note  10/08/2018 Name: DELMI FULFER MRN: [id number] DOB: 08/21/1960  Telephone outreach to patient today to follow up on [employer] application that was returned due to missing prescriber signature.   Patient requested I call back at a later time.    Follow up plan: Telephone follow up appointment with CCM team member scheduled for: Monday, May 4  Ruben Reason, PharmD Clinical Pharmacist [place] [phone]

## 2018-10-11 ENCOUNTER — Ambulatory Visit: Payer: Self-pay | Admitting: Pharmacist

## 2018-10-11 ENCOUNTER — Other Ambulatory Visit: Payer: Self-pay

## 2018-10-11 ENCOUNTER — Telehealth: Payer: Self-pay

## 2018-10-11 ENCOUNTER — Ambulatory Visit: Payer: Self-pay

## 2018-10-11 DIAGNOSIS — E7800 Pure hypercholesterolemia, unspecified: Secondary | ICD-10-CM

## 2018-10-11 DIAGNOSIS — E119 Type 2 diabetes mellitus without complications: Secondary | ICD-10-CM

## 2018-10-11 DIAGNOSIS — I10 Essential (primary) hypertension: Secondary | ICD-10-CM

## 2018-10-11 NOTE — Chronic Care Management (AMB) (Signed)
Chronic Care Management   Follow Up Note   10/11/2018 Name: Alexis Lloyd MRN: [id number] DOB: 1960-11-25  Referred by: Alexis Daring, PA-C Reason for referral : Chronic Care Management (follow up DM)   Subjective: "I just am tired of living my life like I am in bondage". "I put it in God's hands and if I am supposed to die with diabetes then that's His will"   Objective:  Lab Results  Component Value Date   HGBA1C 8.9 (H) 09/16/2018   HGBA1C 8.4 (A) 06/16/2018   HGBA1C 13.8 (A) 03/02/2018   Lab Results  Component Value Date   MICROALBUR 50 08/31/2017   LDLCALC 163 (H) 09/16/2018   CREATININE 0.72 09/16/2018    Assessment: Alexis Lloyd is a 58 year old female primary care patient of Alexis Malling PA-C who was referred to the CCM program for assistance with medication management/assistance and DM/HTN disease management.Follow up phone call today to discuss recent increase in A1C and to discuss barriers to self care related to her diabetes.Last office visit with Alexis Daring, PA-C was 09/16/2018.    Review of patient status, including review of consultants reports, relevant laboratory and other test results, and collaboration with appropriate care team members and the patient's provider was performed as part of comprehensive patient evaluation and provision of chronic care management services.    Goals Addressed            This Visit's Progress   . "I want to get my A1C down to 7" (pt-stated)       Today, Alexis Lloyd seems frustrated that she has DM. She focuses on her recent birthday, the delivery of a cake from her son, her sister making her a pineapple cheesecake, and her love of mountain dews. Alexis Lloyd first engaged with CCM Team for DM education back in September 2019 and was very enthusiastic about lifestyle changes to better manage her DM. She was provided education and diet samples and even told she could have "treats" in moderation if she made  smart choices and counted her carbs. With this education and support she was able to bring her A1C down from 13.8 to 8.4. Recently her A1C went back up to 8.9 and CCM Team reengaged for additional support.   Today, Alexis Lloyd states she is tired of living in "bondage". She feels if she dies with DM it is God's will. She is tired of worring about and depriving herself of the things she loves most. She drinks 2 mountain dews daily (not diet) because diet dies not taste good. She has a "sweet tooth" and feels if she wants something sweet each day she should have it. She CLEARLY understands the consequences to her actions including renal damage, increase risk of heart attack and stroke, poor wound healing, and eye complications not to mention vascular complications.  She is not engaged with [facility] SW to discuss depression symptoms. CCM Team will continue to support patient.   Current Barriers:   Covid-19 restrictions  Mild depression symptoms secondary to Covid-19 restrictions  Lack of social support from family  Nurse Case Manager Clinical Goal(s):   Over the next 3 months patient will lower her A1C to 7 through diet, exercise, medication compliance  Interventions:   Assessed for continued compliance with DM self care/management plan of care . Discussed barriers to lack of DM self care management . Reinforced education previously learned including importance of medication compliance, daily exercise, glucose monitoring, and adherence to  ADA diet . Discussed importance of outdoor activities as a way of improving mental health and decrease stress during this pandemic . Provided additional dietary information on Carb Counting   Patient Self Care Activities:  . Take medications as prescribed . Check sugars daily as directed an record . Exercise daily (walking) . Adheres to ADA diet and portion control . Remain engaged with [place]  Please see past updates related to this goal by  clicking on the "Past Updates" button in the selected goal           Telephone follow up appointment with CCM team member scheduled for: 2 weeks with CCM RN CM   Analeya Luallen E. Rollene Rotunda, RN, BSN Nurse Care Coordinator [facility] [phone]

## 2018-10-11 NOTE — Patient Instructions (Addendum)
  Thank you allowing the Chronic Care Management Team to be a part of your care! It was a pleasure speaking with you today!  1. On average, people with diabetes should get about 45% of their calories from carbs. A carb serving is measured as 15 grams per serving. That means most women need 3 to 4 carb servings (45-60 grams) per meal, while most men need about 4 to 5 carb servings (60-75 grams).       Diabetes and Carbs ? Eat Well with Diabetes  CDC       [email or web address] 2. Please continue to engage with Alexis Lloyd for situational depression management 3. Continue to check your blood sugars daily and take your medications as prescribed. 4. Please review carb counting information. This will allow you to have "treats" by making smart choices throughout your day. 5. Please consider cutting back to 1 mountain dew daily for starters. You also need to eat throughout the day and not skip any meals. Your most important meal of the day is your breakfast.  CCM (Chronic Care Management) Team   Trish Fountain RN, BSN Nurse Care Coordinator  [phone]  Ruben Reason PharmD  Clinical Pharmacist  [phone]   [name], LCSW Clinical Social Worker [phone]  Goals Addressed            This Visit's Progress   . "I want to get my A1C down to 7" (pt-stated)       Current Barriers:   Covid-19 restrictions  Mild depression symptoms secondary to Covid-19 restrictions  Lack of social support from family  Nurse Case Manager Clinical Goal(s):   Over the next 3 months patient will lower her A1C to 7 through diet, exercise, medication compliance  Interventions:   Assessed for continued compliance with DM self care/management plan of care . Discussed barriers to lack of DM self care management . Reinforced education previously learned including importance of medication compliance, daily  exercise, glucose monitoring, and adherence to ADA diet . Discussed importance of outdoor activities as a way of improving mental health and decrease stress during this pandemic . Provided additional dietary information on Carb Counting   Patient Self Care Activities:  . Take medications as prescribed . Check sugars daily as directed an record . Exercise daily (walking) . Adheres to ADA diet and portion control . Remain engaged with [place]  Please see past updates related to this goal by clicking on the "Past Updates" button in the selected goal          The patient verbalized understanding of instructions provided today and declined a print copy of patient instruction materials.   Telephone follow up appointment with CCM team member scheduled for:2 weeks

## 2018-10-11 NOTE — Chronic Care Management (AMB) (Signed)
  Chronic Care Management   Note  10/11/2018 Name: Alexis Lloyd MRN: [id number] DOB: 10-06-1960  Telephone outreach to patient today to follow up on [employer] application that was returned due to missing prescriber signature.   Patient requested I call back tomorrow as she was busy for the rest of the day. (unsuccessful outreach #2).  Follow up plan: Telephone follow up appointment with CCM team member scheduled for: Tuesday, May 5 at 8:30 am  Ruben Reason, PharmD Clinical Pharmacist [place] [phone]

## 2018-10-12 ENCOUNTER — Telehealth: Payer: Self-pay

## 2018-10-12 ENCOUNTER — Ambulatory Visit: Payer: Self-pay | Admitting: Pharmacist

## 2018-10-12 DIAGNOSIS — E119 Type 2 diabetes mellitus without complications: Secondary | ICD-10-CM

## 2018-10-12 NOTE — Chronic Care Management (AMB) (Signed)
  Chronic Care Management   Note  10/12/2018 Name: Alexis Lloyd MRN: [id number] DOB: [birth year]  Telephone outreach to patient today as requested to follow up on [employer] application that was missing a prescriber signature.   Was unable to reach patient via telephone today and have left HIPAA compliant voicemail asking patient to return my call. (unsuccessful outreach #3).  Follow up plan: The CM team will reach out to the patient again over the next 3 days.   Ruben Reason, PharmD Clinical Pharmacist [place] [phone]

## 2018-10-14 ENCOUNTER — Telehealth: Payer: Self-pay

## 2018-10-14 NOTE — Telephone Encounter (Signed)
Patient is calling to check the status of the form completion for Januvia. Please advise. [phone]

## 2018-10-14 NOTE — Telephone Encounter (Signed)
I signed her form for her the day she dropped it off. I had given the form to Mickel Baas and she was going to call her. I think we mailed it to the company for her. Mickel Baas is that correct?

## 2018-10-15 ENCOUNTER — Telehealth: Payer: Self-pay

## 2018-10-18 ENCOUNTER — Ambulatory Visit (INDEPENDENT_AMBULATORY_CARE_PROVIDER_SITE_OTHER): Payer: Medicare Other | Admitting: Pharmacist

## 2018-10-18 DIAGNOSIS — E1165 Type 2 diabetes mellitus with hyperglycemia: Secondary | ICD-10-CM

## 2018-10-18 DIAGNOSIS — E1140 Type 2 diabetes mellitus with diabetic neuropathy, unspecified: Secondary | ICD-10-CM | POA: Diagnosis not present

## 2018-10-18 DIAGNOSIS — E119 Type 2 diabetes mellitus without complications: Secondary | ICD-10-CM | POA: Diagnosis not present

## 2018-10-18 DIAGNOSIS — IMO0002 Reserved for concepts with insufficient information to code with codable children: Secondary | ICD-10-CM

## 2018-10-19 ENCOUNTER — Ambulatory Visit (INDEPENDENT_AMBULATORY_CARE_PROVIDER_SITE_OTHER): Payer: Medicare Other | Admitting: Physician Assistant

## 2018-10-19 ENCOUNTER — Other Ambulatory Visit: Payer: Self-pay

## 2018-10-19 ENCOUNTER — Encounter: Payer: Self-pay | Admitting: Physician Assistant

## 2018-10-19 VITALS — BP 118/72 | HR 74 | Temp 98.2°F | Resp 16 | Wt 216.0 lb

## 2018-10-19 DIAGNOSIS — H1033 Unspecified acute conjunctivitis, bilateral: Secondary | ICD-10-CM

## 2018-10-19 MED ORDER — OFLOXACIN 0.3 % OP SOLN: 1.0000 [drp] | Freq: Four times a day (QID) | OPHTHALMIC | 0 refills | Status: DC

## 2018-10-19 NOTE — Patient Instructions (Signed)
Bacterial Conjunctivitis, Adult  Bacterial conjunctivitis is an infection of your conjunctiva. This is the clear membrane that covers the white part of your eye and the inner part of your eyelid. This infection can make your eye:  · Red or pink.  · Itchy.  This condition spreads easily from person to person (is contagious) and from one eye to the other eye.  What are the causes?  · This condition is caused by germs (bacteria). You may get the infection if you come into close contact with:  ? A person who has the infection.  ? Items that have germs on them (are contaminated), such as face towels, contact lens solution, or eye makeup.  What increases the risk?  You are more likely to get this condition if you:  · Have contact with people who have the infection.  · Wear contact lenses.  · Have a sinus infection.  · Have had a recent eye injury or surgery.  · Have a weak body defense system (immune system).  · Have dry eyes.  What are the signs or symptoms?    · Thick, yellowish discharge from the eye.  · Tearing or watery eyes.  · Itchy eyes.  · Burning feeling in your eyes.  · Eye redness.  · Swollen eyelids.  · Blurred vision.  How is this treated?    · Antibiotic eye drops or ointment.  · Antibiotic medicine taken by mouth. This is used for infections that do not get better with drops or ointment or that last more than 10 days.  · Cool, wet cloths placed on the eyes.  · Artificial tears used 2-6 times a day.  Follow these instructions at home:  Medicines  · Take or apply your antibiotic medicine as told by your doctor. Do not stop taking or applying the antibiotic even if you start to feel better.  · Take or apply over-the-counter and prescription medicines only as told by your doctor.  · Do not touch your eyelid with the eye-drop bottle or the ointment tube.  Managing discomfort  · Wipe any fluid from your eye with a warm, wet washcloth or a cotton ball.  · Place a clean, cool, wet cloth on your eye. Do this for  10-20 minutes, 3-4 times per day.  General instructions  · Do not wear contacts until the infection is gone. Wear glasses until your doctor says it is okay to wear contacts again.  · Do not wear eye makeup until the infection is gone. Throw away old eye makeup.  · Change or wash your pillowcase every day.  · Do not share towels or washcloths.  · Wash your hands often with soap and water. Use paper towels to dry your hands.  · Do not touch or rub your eyes.  · Do not drive or use heavy machinery if your vision is blurred.  Contact a doctor if:  · You have a fever.  · You do not get better after 10 days.  Get help right away if:  · You have a fever and your symptoms get worse all of a sudden.  · You have very bad pain when you move your eye.  · Your face:  ? Hurts.  ? Is red.  ? Is swollen.  · You have sudden loss of vision.  Summary  · Bacterial conjunctivitis is an infection of your conjunctiva.  · This infection spreads easily from person to person.  · Wash your hands often   with soap and water. Use paper towels to dry your hands.  · Take or apply your antibiotic medicine as told by your doctor.  · Contact a doctor if you have a fever or you do not get better after 10 days.  This information is not intended to replace advice given to you by your health care provider. Make sure you discuss any questions you have with your health care provider.  Document Released: 03/04/2008 Document Revised: 12/30/2017 Document Reviewed: 12/30/2017  Elsevier Interactive Patient Education © 2019 Elsevier Inc.

## 2018-10-19 NOTE — Progress Notes (Signed)
Patient: Alexis Lloyd Female    DOB: 27-Apr-1961   58 y.o.   MRN: [id number] Visit Date: 10/19/2018  Today's Provider: Mar Daring, PA-C   Chief Complaint  Patient presents with  . Eye Drainage   Subjective:     Eye Problem   Both eyes are affected.This is a new problem. The current episode started in the past 7 days (3 days). The problem occurs constantly. There was no injury mechanism. The pain is mild. She does not wear contacts. Associated symptoms include an eye discharge, eye redness, a foreign body sensation, itching and photophobia. Pertinent negatives include no blurred vision, double vision, fever, nausea, recent URI, vomiting or weakness. She has tried eye drops for the symptoms. The treatment provided no relief.   Patient states she has had irritation in both eyes for 3 days. Patient has symptoms of redness, itchy, and watery eyes. Patient has been using eye drops with no relief.  Allergies  Allergen Reactions  . Allegra [Fexofenadine] Nausea And Vomiting  . Avelox [Moxifloxacin] Other (See Comments)    Hallucinations  . Bactrim [Sulfamethoxazole-Trimethoprim] Other (See Comments)    unknown  . Etodolac Nausea Only  . Fish-Derived Products Nausea And Vomiting    With "seafood"  . Penicillins Diarrhea and Nausea And Vomiting  . Shellfish Allergy Nausea Only  . Sulfa Antibiotics Other (See Comments)    unknown  . Macrobid [employer] Macro] Other (See Comments)    Constipation and globus hystericus.     Current Outpatient Medications:  .  albuterol (PROVENTIL HFA) 108 (90 Base) MCG/ACT inhaler, Inhale 2 puffs every 6 (six) hours as needed into the lungs., Disp: 3 Inhaler, Rfl: 1 .  Blood Glucose Monitoring Suppl (ONE TOUCH ULTRA SYSTEM KIT) w/Device KIT, To check blood sugar daily, Disp: 1 each, Rfl: 0 .  cetirizine (ZYRTEC) 10 MG tablet, Take 1 tablet (10 mg total) by mouth daily., Disp: 90 tablet, Rfl: 1 .  glucose blood (ONE TOUCH  ULTRA TEST) test strip, USE TEST STRIP(S) TO CHECK BLLOD SUGAR DAILY, Disp: 100 each, Rfl: 12 .  ketoconazole (NIZORAL) 2 % cream, APPLY CREAM TOPICALLY TWICE DAILY TO FEET, Disp: , Rfl: 2 .  Lancets (ONETOUCH ULTRASOFT) lancets, USE LANCET(S)  TO CHECK GLUCOSE DAILY, Disp: 100 each, Rfl: 12 .  lisinopril-hydrochlorothiazide (PRINZIDE,ZESTORETIC) 20-12.5 MG tablet, TAKE 2 TABLETS BY MOUTH ONCE DAILY, Disp: 180 tablet, Rfl: 1 .  metFORMIN (GLUCOPHAGE) 1000 MG tablet, TAKE 1 TABLET BY MOUTH TWICE DAILY WITH A MEAL, Disp: 180 tablet, Rfl: 1 .  montelukast (SINGULAIR) 10 MG tablet, TAKE 1 TABLET BY MOUTH ONCE DAILY, Disp: 30 tablet, Rfl: 11 .  sitaGLIPtin (JANUVIA) 100 MG tablet, Take 1 tablet (100 mg total) by mouth daily., Disp: 90 tablet, Rfl: 1 .  spironolactone (ALDACTONE) 25 MG tablet, TAKE 1 TABLET BY MOUTH ONCE DAILY, Disp: 90 tablet, Rfl: 1 .  verapamil (CALAN) 80 MG tablet, Take 1 tablet by mouth twice daily, Disp: 180 tablet, Rfl: 1  Review of Systems  Constitutional: Negative for appetite change, chills, fatigue and fever.  Eyes: Positive for photophobia, discharge, redness and itching. Negative for blurred vision, double vision, pain and visual disturbance.  Respiratory: Negative for chest tightness and shortness of breath.   Cardiovascular: Negative for chest pain and palpitations.  Gastrointestinal: Negative for abdominal pain, nausea and vomiting.  Neurological: Negative for dizziness and weakness.    Social History   Tobacco Use  . Smoking status: Former  Smoker    Packs/day: 0.25    Years: 21.00    Pack years: 5.25    Last attempt to quit: 06/08/1997    Years since quitting: 21.3  . Smokeless tobacco: Never Used  Substance Use Topics  . Alcohol use: No      Objective:   BP 118/72 (BP Location: Right Arm, Patient Position: Sitting, Cuff Size: Large)   Pulse 74   Temp 98.2 F (36.8 C) (Oral)   Resp 16   Wt 216 lb (98 kg)   SpO2 99%   BMI 45.14 kg/m  Vitals:    10/19/18 1341  BP: 118/72  Pulse: 74  Resp: 16  Temp: 98.2 F (36.8 C)  TempSrc: Oral  SpO2: 99%  Weight: 216 lb (98 kg)     Physical Exam Vitals signs reviewed.  Constitutional:      General: She is not in acute distress.    Appearance: Normal appearance. She is well-developed. She is obese. She is not ill-appearing or diaphoretic.  HENT:     Head: Normocephalic and atraumatic.     Right Ear: Hearing, tympanic membrane, ear canal and external ear normal.     Left Ear: Hearing, tympanic membrane, ear canal and external ear normal.     Nose: Nose normal.     Mouth/Throat:     Mouth: Mucous membranes are moist.     Pharynx: Uvula midline. No oropharyngeal exudate.  Eyes:     General: Lids are normal. No scleral icterus.       Right eye: No discharge.        Left eye: No discharge.     Extraocular Movements:     Right eye: Normal extraocular motion and no nystagmus.     Left eye: Normal extraocular motion and no nystagmus.     Conjunctiva/sclera:     Right eye: Right conjunctiva is injected.     Left eye: Left conjunctiva is injected.     Pupils: Pupils are equal, round, and reactive to light.  Neck:     Musculoskeletal: Normal range of motion and neck supple.     Thyroid: No thyromegaly.     Trachea: No tracheal deviation.  Cardiovascular:     Rate and Rhythm: Normal rate and regular rhythm.     Heart sounds: Normal heart sounds. No murmur. No friction rub. No gallop.   Pulmonary:     Effort: Pulmonary effort is normal. No respiratory distress.     Breath sounds: Normal breath sounds. No stridor. No wheezing or rales.  Lymphadenopathy:     Cervical: No cervical adenopathy.  Skin:    General: Skin is warm and dry.  Neurological:     Mental Status: She is alert.         Assessment & Plan    1. Acute bacterial conjunctivitis of both eyes Will treat with ofloxacin drops as below. Call if worsening.  - ofloxacin (OCUFLOX) 0.3 % ophthalmic solution; Place 1 drop  into both eyes 4 (four) times daily. X 5-7 days  Dispense: 5 mL; Refill: 0     Mar Daring, PA-C  [facility]

## 2018-10-20 ENCOUNTER — Ambulatory Visit: Payer: Self-pay | Admitting: *Deleted

## 2018-10-20 ENCOUNTER — Telehealth: Payer: Self-pay | Admitting: *Deleted

## 2018-10-20 DIAGNOSIS — I10 Essential (primary) hypertension: Secondary | ICD-10-CM

## 2018-10-20 DIAGNOSIS — E119 Type 2 diabetes mellitus without complications: Secondary | ICD-10-CM

## 2018-10-20 NOTE — Chronic Care Management (AMB) (Signed)
   Chronic Care Management   Unsuccessful Call Note 10/20/2018 Name: Alexis Lloyd MRN: [id number] DOB: 11/26/1960  Patient  is a 58 year old female who sees  Mar Daring, [place]  for primary care.  The CCM RNCM asked this social worker  to consult the patient for mental health counseling and resources.    This social worker was unable to reach patient via telephone today for follow up. I have left HIPAA compliant voicemail asking patient to return my call. (unsuccessful outreach #1).   Plan: Will follow-up within 7 business days via telephone.      Elliot Gurney, [place] Worker  [facility] [phone]

## 2018-10-21 ENCOUNTER — Ambulatory Visit: Payer: Self-pay | Admitting: *Deleted

## 2018-10-21 DIAGNOSIS — I10 Essential (primary) hypertension: Secondary | ICD-10-CM

## 2018-10-21 DIAGNOSIS — E119 Type 2 diabetes mellitus without complications: Secondary | ICD-10-CM

## 2018-10-21 NOTE — Chronic Care Management (AMB) (Signed)
  Chronic Care Management    Clinical Social Work Follow Up Note  10/21/2018 Name: Alexis Lloyd MRN: [id number] DOB: [birth year]  Late Entry  Zyniah D Lewey is a 58 y.o. year old female who is a primary care patient of Rubye [place]. The CCM team was consulted for assistance with Mental Health Counseling and Resources.   Review of patient status, including review of consultants reports, other relevant assessments, and collaboration with appropriate care team members and the patient's provider was performed as part of comprehensive patient evaluation and provision of chronic care management services.     Goals Addressed            This Visit's Progress   . "I would like to make sure I get my medication" (pt-stated)       Patient returned call on 5.13/2020. Per patient, she is feeling much better stating that he has lost 2 pounds. Patient denied depressed mood stating that her strong spiritual life helps her cope. Patient discussed being relieved that she has her medication ordered.   Current Barriers:  . Financial constraints . Mental Health Concerns   Clinical Social Work Clinical Goal(s):  Marland Kitchen Over the next 30 days, client will work with SW to address concerns related to depressed mood . Over the next 30 days, patient will work with CCM Pharmacist to address needs related to obtaining her medications-specifically her Januvia     Interventions: . Patient interviewed and appropriate assessments performed . Explored current mood, continued to encouraged positive coping strategies for stress and depression . Confirmed that  her Celesta Gentile has been sent, she has enough medication until the shipment arrives.    Patient Self Care Activities:  . Self administers medications as prescribed . Attends all scheduled provider appointments . Attends church or other social activities . Performs ADL's independently . Performs IADL's independently . Calls provider office for new  concerns or questions  Please see past updates related to this goal by clicking on the "Past Updates" button in the selected goal          Follow Up Plan: SW will follow up with patient by phone over the next 14 days   [place], [place] Worker  [facility] [phone]

## 2018-10-21 NOTE — Patient Instructions (Signed)
Thank you allowing the Chronic Care Management Team to be a part of your care! It was a pleasure speaking with you today!  1. Please continue to utilize positive coping strategies for stress and depression.     CCM (Chronic Care Management) Team   Trish Fountain RN, BSN Nurse Care Coordinator  [phone]  Ruben Reason PharmD  Clinical Pharmacist  [phone]   Elliot Gurney, LCSW Clinical Social Worker [phone]  Goals Addressed            This Visit's Progress   . "I would like to make sure I get my medication" (pt-stated)       Current Barriers:  . Financial constraints . Mental Health Concerns   Clinical Social Work Clinical Goal(s):  Marland Kitchen Over the next 30 days, client will work with SW to address concerns related to depressed mood . Over the next 30 days, patient will work with CCM Pharmacist to address needs related to obtaining her medications-specifically her Januvia     Interventions: . Patient interviewed and appropriate assessments performed . Explored current mood, continued to encouraged positive coping strategies for stress and depression . Confirmed that  her Celesta Gentile has been sent, she has enough medication until the shipment arrives.    Patient Self Care Activities:  . Self administers medications as prescribed . Attends all scheduled provider appointments . Attends church or other social activities . Performs ADL's independently . Performs IADL's independently . Calls provider office for new concerns or questions  Please see past updates related to this goal by clicking on the "Past Updates" button in the selected goal          The patient verbalized understanding of instructions provided today and declined a print copy of patient instruction materials.   The CM team will reach out to the patient again over the next 14 days.

## 2018-10-22 ENCOUNTER — Telehealth: Payer: Self-pay

## 2018-10-22 NOTE — Chronic Care Management (AMB) (Signed)
  Chronic Care Management   Follow Up Note   10/22/2018 Name: Alexis Lloyd MRN: [id number] DOB: Feb 24, 1961  Referred by: Mar Daring, PA-C Reason for referral : Chronic Care Management (Januvia) and Care Coordination   Alexis Lloyd is a 58 y.o. year old female who is a primary care patient of Rubye Beach. The CCM team was consulted for assistance with chronic disease management and care coordination needs.    Review of patient status, including review of consultants reports, relevant laboratory and other test results, and collaboration with appropriate care team members and the patient's provider was performed as part of comprehensive patient evaluation and provision of chronic care management services.    Assessment: Per chart review, patient has communicated with provider Grace Bushy regarding her Januvia application that had to be re-signed.   Goals Addressed            This Visit's Progress   . COMPLETED: I need to reapply for copay assistance for Januvia (pt-stated)       Current Barriers:  . financial  Pharmacist Clinical Goal(s): Over the next 14 days, Alexis Lloyd will provide the necessary supplementary documents (proof of household income) needed for medication assistance applications to CCM pharmacist.   Interventions: . CCM pharmacist will apply for medication assistance program for Januvia made by [employer].  Marland Kitchen Updated 09/28/18: [employer] representative states that patient's application was mailed back to her because provider did not sign application. We can either reapply with a new application OR have Sonia Baller sign the returned application and resubmit.   Patient Self Care Activities:  Marland Kitchen Monitor mail for correspondence from [employer]. Please bring this mail to [facility] for Alexis Lloyd to sign.   Please see past updates related to this goal by clicking on the "Past Updates" button in the selected goal          Plan: Patient  has placed order for Januvia and has enough medication to last her until shipment arrives  Telephone follow up appointment with CCM team member scheduled for: May 28 with PharmD as scheduled for statin medicaiton   Ruben Reason, PharmD Clinical Pharmacist [place] [phone]

## 2018-10-26 ENCOUNTER — Ambulatory Visit: Payer: Self-pay

## 2018-10-26 ENCOUNTER — Other Ambulatory Visit: Payer: Self-pay

## 2018-10-26 DIAGNOSIS — E119 Type 2 diabetes mellitus without complications: Secondary | ICD-10-CM

## 2018-10-26 DIAGNOSIS — I10 Essential (primary) hypertension: Secondary | ICD-10-CM

## 2018-10-26 NOTE — Patient Instructions (Addendum)
Thank you allowing the Chronic Care Management Team to be a part of your care! It was a pleasure speaking with you today!  1. Please continue to engage with [place] for situational depression management 2. Continue to check your blood sugars daily and take your medications as prescribed. 3  Please refer to your carb counting resources to make smart choices about the amount of carbohydrates you eat 4 Try to get in some exercise throughout the day. Every little bit counts!  CCM (Chronic Care Management) Team   Trish Fountain RN, BSN Nurse Care Coordinator  [phone]  Ruben Reason PharmD  Clinical Pharmacist  [phone]   Elliot Gurney, LCSW Clinical Social Worker [phone]  Goals Addressed            This Visit's Progress   . "I want to get my A1C down to 7" (pt-stated)       Current Barriers:   Covid-19 restrictions  Mild depression symptoms secondary to Covid-19 restrictions  Lack of social support from family  Nurse Case Manager Clinical Goal(s):   Over the next 3 months patient will lower her A1C to 7 through diet, exercise, medication compliance  Interventions:   Assessed for continued compliance with DM self care/management plan of care . Discussed barriers to lack of DM self care management . Reinforced importance of outdoor activities as a way of improving mental health and decrease stress during this pandemic . Review recent fasting blood sugars . Collaborated with PCP and Clinic Pharmacist about access/samples of Januvia . Reinforced Covid-19 Wait, Wear, Wash strategies as more services begin to open   Patient Self Care Activities:  . Take medications as prescribed . Check sugars daily as directed an record . Exercise daily (walking) . Adheres to ADA diet and portion control . Remain engaged with [place] . Follows Covid 19 infection prevention stratagies  Please see past updates related to this goal  by clicking on the "Past Updates" button in the selected goal       . 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   Nurse Case Manager Clinical Goal(s):  Marland Kitchen Over the next 7 days, patient will verbalize understanding of plan for obtaining a [facility] dental provider withing the [place] area  . Over the next 7 days, patient will contact resources provided today for dental care  Interventions:  . Provided patient with [facility] in-network providers. . Discussed patient picking up a more comprehensive list of in-network dental providers tomorrow at PCP office.  [place] [facility] Dentist [place], [place] [address] Phone: [phone]  [place], [place] [address] Phone: [phone] General Dentist  [place], [facility] [place] General Dentist [address] [place]ressa Sheller [place], [place] [address] Phone: [phone]   Patient Self Care Activities:  . Schedule dental appointment with in network provider  Plan:  . Patient will contact dental provider and schedule appointment as soon as possible   Initial goal documentation        The patient verbalized understanding of instructions provided today and declined a print copy of patient instruction materials.   Telephone follow up appointment with CCM team member scheduled for: 30 days  SYMPTOMS OF A STROKE   You  have any symptoms of stroke. "BE FAST" is an easy way to remember the main warning signs: ? B - Balance. Signs are dizziness, sudden trouble walking, or loss of balance. ? E - Eyes. Signs are trouble seeing or a sudden change in how you see. ? F - Face. Signs are sudden weakness or loss of feeling of the face, or the face or eyelid drooping on one side. ? A - Arms. Signs are weakness or loss of  feeling in an arm. This happens suddenly and usually on one side of the body. ? S - Speech. Signs are sudden trouble speaking, slurred speech, or trouble understanding what people say. ? T - Time. Time to call emergency services. Write down what time symptoms started.  You have other signs of stroke, such as: ? A sudden, very bad headache with no known cause. ? Feeling sick to your stomach (nausea). ? Throwing up (vomiting). ? Jerky movements you cannot control (seizure).  SYMPTOMS OF A HEART ATTACK  What are the signs or symptoms? Symptoms of this condition include:  Chest pain. It may feel like: ? Crushing or squeezing. ? Tightness, pressure, fullness, or heaviness.  Pain in the arm, neck, jaw, back, or upper body.  Shortness of breath.  Heartburn.  Indigestion.  Nausea.  Cold sweats.  Feeling tired.  Sudden lightheadedness.

## 2018-10-26 NOTE — Chronic Care Management (AMB) (Signed)
Chronic Care Management   Follow Up Note   10/26/2018 Name: Alexis Lloyd MRN: [id number] DOB: [birth year]  Referred by: Mar Daring, PA-C Reason for referral : Chronic Care Management (telephone follow up)   Subjective: "I am feeling better about things today"   Objective:  Lab Results  Component Value Date   HGBA1C 8.9 (H) 09/16/2018   HGBA1C 8.4 (A) 06/16/2018   HGBA1C 13.8 (A) 03/02/2018   Lab Results  Component Value Date   MICROALBUR 50 08/31/2017   LDLCALC 163 (H) 09/16/2018   CREATININE 0.72 09/16/2018    Assessment: Alexis Lloyd is a 58 year old female primary care patient of Fenton Malling PA-C who was referred to the CCM program for assistance with medication management/assistance and DM/HTN disease management.Follow up phone call today todiscuss recent increase in A1C and to discuss barriers to self care related to her diabetes.Last office visit with Mar Daring, PA-C was 09/16/2018.   Review of patient status, including review of consultants reports, relevant laboratory and other test results, and collaboration with appropriate care team members and the patient's provider was performed as part of comprehensive patient evaluation and provision of chronic care management services.    Goals Addressed            This Visit's Progress   . "I want to get my A1C down to 7" (pt-stated)       Alexis Lloyd seems to be in a much better place mentally than she was the last time I spoke with her. She states she is just trying to take it one day at a time and not look at the big picture. She continues to check her fasting blood sugars and reports average numbers 120s-130s. She has changed her soda preference from mountain dew to ginger ale, but still not drinking diet sodas. She is trying to limit her carbohydrates. She feels she is able to manage her health better when she knows social restrictions are going to be lifted. Being social is a way she avoids  depression symptoms. She is taking her medication as prescribed but states she only has 8 days of januvia left and she is in need of samples.   Current Barriers:   Covid-19 restrictions  Mild depression symptoms secondary to Covid-19 restrictions  Lack of social support from family  Nurse Case Manager Clinical Goal(s):   Over the next 3 months patient will lower her A1C to 7 through diet, exercise, medication compliance  Interventions:   Assessed for continued compliance with DM self care/management plan of care . Discussed barriers to lack of DM self care management . Reinforced importance of outdoor activities as a way of improving mental health and decrease stress during this pandemic . Review recent fasting blood sugars . Collaborated with PCP and Clinic Pharmacist about access/samples of Januvia . Reinforced Covid-19 Wait, Wear, Wash strategies as more services begin to open   Patient Self Care Activities:  . Take medications as prescribed . Check sugars daily as directed an record . Exercise daily (walking) . Adheres to ADA diet and portion control . Remain engaged with [place] . Follows Covid 19 infection prevention stratagies  Please see past updates related to this goal by clicking on the "Past Updates" button in the selected goal       . COMPLETED: I really need to see a dentist (pt-stated)       Patient understands her dental health benefits and has been provided with local dentist  that take [facility] Medicare. She will schedule appointment after COVID-19 restrictions have been lifed.  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   Nurse Case Manager Clinical Goal(s):  Marland Kitchen Over the next 7 days, patient will verbalize understanding of plan for obtaining a [facility] dental provider withing the [place] area  . Over the next 7 days, patient will contact resources provided today for dental  care  Interventions:  . Provided patient with [facility] in-network providers. . Discussed patient picking up a more comprehensive list of in-network dental providers tomorrow at PCP office.  [place] [facility] Dentist [place], [place] [address] Phone: [phone]  [place] [employer] [place], [place] [address] Phone: [phone] General Dentist  [place], [facility] [place] General Dentist [address] [place]ress[place] Sheller [place], [place] [address] Phone: [phone]   Patient Self Care Activities:  . Schedule dental appointment with in network provider  Plan:  . Patient will contact dental provider and schedule appointment as soon as possible   Initial goal documentation         Telephone follow up appointment with CCM team member scheduled for: 30 days   Leldon Steege E. Rollene Rotunda, RN, BSN Nurse Care Coordinator [facility] Practice/[facility] Care Management [phone]

## 2018-11-03 ENCOUNTER — Ambulatory Visit: Payer: Self-pay

## 2018-11-03 ENCOUNTER — Ambulatory Visit: Payer: Self-pay | Admitting: *Deleted

## 2018-11-03 DIAGNOSIS — I10 Essential (primary) hypertension: Secondary | ICD-10-CM

## 2018-11-03 DIAGNOSIS — E119 Type 2 diabetes mellitus without complications: Secondary | ICD-10-CM

## 2018-11-04 ENCOUNTER — Ambulatory Visit: Payer: Self-pay

## 2018-11-04 NOTE — Chronic Care Management (AMB) (Signed)
   Chronic Care Management   Unsuccessful Call Note 11/04/2018 Name: Alexis Lloyd MRN: [id number] DOB: 05/25/1961  [name] is a 58 year old female who sees Fenton Malling PA-C for primary care. This social worker was asked by the CCM RNCM to consult the patient for Counseling and [place].      Was unable to reach patient via telephone today for counseling support. I have left HIPAA compliant voicemail asking patient to return my call. (unsuccessful outreach #1).   Plan: Will follow-up within 7 business days via telephone.      Elliot Gurney, [place] Worker  [place] [facility] Care Management [phone]

## 2018-11-05 ENCOUNTER — Ambulatory Visit: Payer: Self-pay

## 2018-11-05 ENCOUNTER — Ambulatory Visit: Payer: Self-pay | Admitting: *Deleted

## 2018-11-05 ENCOUNTER — Ambulatory Visit: Payer: Self-pay | Admitting: Pharmacist

## 2018-11-05 DIAGNOSIS — E7800 Pure hypercholesterolemia, unspecified: Secondary | ICD-10-CM

## 2018-11-05 DIAGNOSIS — I10 Essential (primary) hypertension: Secondary | ICD-10-CM

## 2018-11-05 DIAGNOSIS — E119 Type 2 diabetes mellitus without complications: Secondary | ICD-10-CM

## 2018-11-05 NOTE — Chronic Care Management (AMB) (Signed)
   Chronic Care Management   Unsuccessful Call Note 11/05/2018 Name: Alexis Lloyd MRN: [id number] DOB: [birth year]  Alexis Lloyd is a 58 year old female who sees Fenton Malling PA-C for primary care. The CCM RNCM asked this social worker to consult the patient for mental health counseling and resources.    This social worker was unable to reach patient via telephone today for follow up. I have left HIPAA compliant voicemail asking patient to return my call. (unsuccessful outreach #2).   Plan: Will follow-up within 7 business days via telephone.      Elliot Gurney, [place] Worker  [place] [facility] Care Management [phone]

## 2018-11-05 NOTE — Chronic Care Management (AMB) (Signed)
  Chronic Care Management   Note  11/05/2018 Name: Alexis Lloyd MRN: [id number] DOB: 04/18/1961  Unsuccessful outreach call to Alexis Lloyd today regarding statins and high cholesterol.   Was unable to reach patient via telephone today and have left HIPAA compliant voicemail asking patient to return my call. (unsuccessful outreach #1).  Follow up plan: A HIPPA compliant phone message was left for the patient providing contact information and requesting a return call.  The care management team will reach out to the patient again over the next 5-7 days.   Ruben Reason, PharmD Clinical Pharmacist [place] [phone]

## 2018-11-09 ENCOUNTER — Other Ambulatory Visit: Payer: Self-pay | Admitting: Physician Assistant

## 2018-11-09 DIAGNOSIS — I10 Essential (primary) hypertension: Secondary | ICD-10-CM

## 2018-11-09 DIAGNOSIS — E1140 Type 2 diabetes mellitus with diabetic neuropathy, unspecified: Secondary | ICD-10-CM

## 2018-11-09 DIAGNOSIS — IMO0002 Reserved for concepts with insufficient information to code with codable children: Secondary | ICD-10-CM

## 2018-11-09 DIAGNOSIS — J40 Bronchitis, not specified as acute or chronic: Secondary | ICD-10-CM

## 2018-11-09 DIAGNOSIS — E1165 Type 2 diabetes mellitus with hyperglycemia: Secondary | ICD-10-CM

## 2018-11-09 MED ORDER — METFORMIN HCL 1000 MG PO TABS: 1000.0000 mg | ORAL_TABLET | Freq: Two times a day (BID) | ORAL | 1 refills | Status: DC

## 2018-11-09 MED ORDER — SPIRONOLACTONE 25 MG PO TABS: 25.0000 mg | ORAL_TABLET | Freq: Every day | ORAL | 1 refills | Status: DC

## 2018-11-09 MED ORDER — VERAPAMIL HCL 80 MG PO TABS: 80.0000 mg | ORAL_TABLET | Freq: Two times a day (BID) | ORAL | 1 refills | Status: DC

## 2018-11-09 MED ORDER — LISINOPRIL-HYDROCHLOROTHIAZIDE 20-12.5 MG PO TABS: 2.0000 | ORAL_TABLET | Freq: Every day | ORAL | 1 refills | Status: DC

## 2018-11-09 MED ORDER — MONTELUKAST SODIUM 10 MG PO TABS: 10.0000 mg | ORAL_TABLET | Freq: Every day | ORAL | 11 refills | Status: DC

## 2018-11-09 NOTE — Telephone Encounter (Signed)
[facility] faxed refill request for the following medications:  verapamil (CALAN) 80 MG tablet  spironolactone (ALDACTONE) 25 MG tablet  lisinopril-hydrochlorothiazide (PRINZIDE,ZESTORETIC) 20-12.5 MG tablet  montelukast (SINGULAIR) 10 MG tablet   metFORMIN (GLUCOPHAGE) 1000 MG tablet   Please advise.

## 2018-11-11 ENCOUNTER — Telehealth: Payer: Self-pay

## 2018-11-12 ENCOUNTER — Ambulatory Visit: Payer: Self-pay | Admitting: *Deleted

## 2018-11-12 ENCOUNTER — Ambulatory Visit: Payer: Self-pay | Admitting: Pharmacist

## 2018-11-12 ENCOUNTER — Telehealth: Payer: Self-pay

## 2018-11-12 DIAGNOSIS — E1140 Type 2 diabetes mellitus with diabetic neuropathy, unspecified: Secondary | ICD-10-CM

## 2018-11-12 DIAGNOSIS — E7800 Pure hypercholesterolemia, unspecified: Secondary | ICD-10-CM

## 2018-11-12 DIAGNOSIS — E1165 Type 2 diabetes mellitus with hyperglycemia: Secondary | ICD-10-CM

## 2018-11-12 DIAGNOSIS — E119 Type 2 diabetes mellitus without complications: Secondary | ICD-10-CM

## 2018-11-12 DIAGNOSIS — IMO0002 Reserved for concepts with insufficient information to code with codable children: Secondary | ICD-10-CM

## 2018-11-12 DIAGNOSIS — K3189 Other diseases of stomach and duodenum: Secondary | ICD-10-CM

## 2018-11-12 NOTE — Chronic Care Management (AMB) (Signed)
    Chronic Care Management   Unsuccessful Call Note 11/12/2018 Name: Alexis Lloyd MRN: [id number] DOB: 11-10-1960  Patient is a 58 year old female who sees Mar Daring, [place] for primary care. CCM RNCM asked this CCM team social worker to consult the patient for counseling and mental health resources.      Was unable to reach patient via telephone today for. I have left HIPAA compliant voicemail asking patient to return my call. (unsuccessful outreach #3).   Plan: This Education officer, museum will cease all further calls for follow up at this time as 3 unsuccessful attempts have been made to reach patient. There has been no return calls      [employer], [place] Worker  [place] Management [phone]

## 2018-11-12 NOTE — Chronic Care Management (AMB) (Signed)
  Chronic Care Management   Note  11/12/2018 Name: Alexis Lloyd MRN: [id number] DOB: May 15, 1961  Unsuccessful outreach call to [name] today to discus statins and high cholesterol.   Was unable to reach patient via telephone today and have left HIPAA compliant voicemail asking patient to return my call. (unsuccessful outreach #2).  Follow up plan: A HIPPA compliant phone message was left for the patient providing contact information and requesting a return call.  The care management team will reach out to the patient again over the next 7 days.   Ruben Reason, PharmD Clinical Pharmacist [place] [phone]

## 2018-11-18 ENCOUNTER — Ambulatory Visit: Payer: Self-pay | Admitting: Pharmacist

## 2018-11-18 ENCOUNTER — Telehealth: Payer: Self-pay

## 2018-11-18 DIAGNOSIS — E7800 Pure hypercholesterolemia, unspecified: Secondary | ICD-10-CM

## 2018-11-18 DIAGNOSIS — E119 Type 2 diabetes mellitus without complications: Secondary | ICD-10-CM

## 2018-11-18 NOTE — Chronic Care Management (AMB) (Signed)
  Chronic Care Management   Note  11/18/2018 Name: Alexis Lloyd MRN: [id number] DOB: 1960-11-13  58 y.o. year old female referred to Chronic Care Management team for medication assistance and followed by CCM clinical pharmacist for high cholesterol by Grace Bushy, PA-C.   CCM clinical pharmacy services are being closed due to three unsuccessful outreach attempts.   Patient has been provided CCM clinical pharmacist contact information if he/she wishes to engage with care managers in the future. Noted that CCM LCSW also closed case recently due to inability to maintain communication.    Ruben Reason, PharmD Clinical Pharmacist [place] [phone]

## 2018-11-25 ENCOUNTER — Ambulatory Visit (INDEPENDENT_AMBULATORY_CARE_PROVIDER_SITE_OTHER): Payer: Medicare Other

## 2018-11-25 ENCOUNTER — Other Ambulatory Visit: Payer: Self-pay

## 2018-11-25 DIAGNOSIS — IMO0002 Reserved for concepts with insufficient information to code with codable children: Secondary | ICD-10-CM

## 2018-11-25 DIAGNOSIS — E1140 Type 2 diabetes mellitus with diabetic neuropathy, unspecified: Secondary | ICD-10-CM | POA: Diagnosis not present

## 2018-11-25 DIAGNOSIS — E1165 Type 2 diabetes mellitus with hyperglycemia: Secondary | ICD-10-CM | POA: Diagnosis not present

## 2018-11-25 DIAGNOSIS — E7800 Pure hypercholesterolemia, unspecified: Secondary | ICD-10-CM | POA: Diagnosis not present

## 2018-11-25 NOTE — Chronic Care Management (AMB) (Signed)
Chronic Care Management   Follow Up Note   11/25/2018 Name: Alexis Lloyd MRN: [id number] DOB: October 01, 1960  Referred by: Mar Daring, PA-C Reason for referral : Chronic Care Management (follow up DM)   Subjective: "I am doing real good now that my grandchildren and great grandchildren are back in my life" "I am just having a hard time turning down deserts"   Objective:  Lab Results  Component Value Date   HGBA1C 8.9 (H) 09/16/2018   HGBA1C 8.4 (A) 06/16/2018   HGBA1C 13.8 (A) 03/02/2018   Lab Results  Component Value Date   MICROALBUR 50 08/31/2017   LDLCALC 163 (H) 09/16/2018   CREATININE 0.72 09/16/2018    Assessment: Alexis Lloyd is a 58 year old female primary care patient of Fenton Malling PA-C who was referred to the CCM program for assistance with medication management/assistance and DM/HTN disease management.Follow up phone call today todiscuss recent increase in A1C and to discuss barriers to self care related to her diabetes.Last office visit with Mar Daring, PA-C was 10/19/2018 for acute bacterial conjunctivitis. Today RN CM followed up with patient to discuss ongoing DM self care management and barrier to self care.  Review of patient status, including review of consultants reports, relevant laboratory and other test results, and collaboration with appropriate care team members and the patient's provider was performed as part of comprehensive patient evaluation and provision of chronic care management services.    Goals Addressed            This Visit's Progress   . "I want to get my A1C down to 7" (pt-stated)       Alexis Lloyd states she is doing very well emotionally now that some restrictions have been lifted and she is able to have her children, grandchildren, and great grandchildren in her life. She is looking forward to this weekend and family gatherings for her 58 year anniversary, great grandchild's 1st birthday, and fathers day. She  was cautioned about important's of 3Ws even if it were family (more than 10 people). She continues to check her sugars. Fasting CBG 130s. She is taking her medications including her Januvia. She is doing better with following a low carb diet however she continues to struggle with daily consumption of deserts. Today she was offered suggestions for "homemade" deserts using sugar substitutes. She is not exercising routinely.  Current Barriers:   Covid-19 restrictions  Mild depression symptoms secondary to Covid-19 restrictions  Lack of social support from family  Nurse Case Manager Clinical Goal(s):   Over the next 3 months patient will lower her A1C to 7 through diet, exercise, medication compliance  Interventions:   Assessed for continued compliance with DM self care/management plan of care . Discussed barriers to lack of DM self care management (resolved) . Reinforced importance of outdoor activities as a way of improving mental health and decrease stress during this pandemic . Review recent fasting blood sugars . Reinforced Covid-19 Wait, Wear, Wash strategies as more services begin to open . Provided patient with alternatives to purchasing deserts such as making deserts using sugar substitutes at home. . 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 Take medications as prescribed . Check sugars daily as directed an record . Exercise daily (walking) . Adheres to ADA diet and portion control . Remain engaged with [place] . Follows Covid 19 infection prevention stratagies  Please see past updates related to this goal by clicking on the "Past Updates" button in the selected goal           Telephone follow up appointment with care management team member scheduled for: 1 month with RN CM  It is noted that [place] and [facility] Pharmacist have closed case for lack of follow up  from patient.  Macarena Langseth E. Rollene Rotunda, RN, BSN Nurse Care Coordinator [facility] Care Management [phone]

## 2018-11-25 NOTE — Patient Instructions (Addendum)
Thank you allowing the Chronic Care Management Team to be a part of your care! It was a pleasure speaking with you today!   Continue to take all medications as prescribed  Continue to check your blood sugars daily and take your medications as prescribed.  Please review carb counting information. This will allow you to have "treats" by making smart choices throughout your day.  Google "sugar free desert recipes" and consider making deserts at home. If this is not an option then buy sugar free deserts at a local grocery store or bakery.  You are doing a great job with you meal plan. Keep up the good work with your balanced diet and carb reduction  Keep sugar free candy on hand for moments when you crave sweets.  CCM (Chronic Care Management) Team   Trish Fountain RN, BSN Nurse Care Coordinator  [phone]  Ruben Reason PharmD  Clinical Pharmacist  [phone]   Elliot Gurney, LCSW Clinical Social Worker [phone]  Goals Addressed            This Visit's Progress   . "I want to get my A1C down to 7" (pt-stated)       Current Barriers:   Covid-19 restrictions  Mild depression symptoms secondary to Covid-19 restrictions  Lack of social support from family  Nurse Case Manager Clinical Goal(s):   Over the next 3 months patient will lower her A1C to 7 through diet, exercise, medication compliance  Interventions:   Assessed for continued compliance with DM self care/management plan of care . Discussed barriers to lack of DM self care management (resolved) . Reinforced importance of outdoor activities as a way of improving mental health and decrease stress during this pandemic . Review recent fasting blood sugars . Reinforced Covid-19 Wait, Wear, Wash strategies as more services begin to open . Provided patient with alternatives to purchasing deserts such as making deserts using sugar substitutes at home. . Encouraged patient to keep sugar free candy on hand  to combat her "addiction" to sugar and deserts   Patient Self Care Activities:  . Take medications as prescribed . Check sugars daily as directed an record . Exercise daily (walking) . Adheres to ADA diet and portion control . Remain engaged with [place] . Follows Covid 19 infection prevention stratagies  Please see past updates related to this goal by clicking on the "Past Updates" button in the selected goal          The patient verbalized understanding of instructions provided today and declined a print copy of patient instruction materials.   Telephone follow up appointment with care management team member scheduled for: 1 month  SYMPTOMS OF A STROKE   You have any symptoms of stroke. "BE FAST" is an easy way to remember the main warning signs: ? B - Balance. Signs are dizziness, sudden trouble walking, or loss of balance. ? E - Eyes. Signs are trouble seeing or a sudden change in how you see. ? F - Face. Signs are sudden weakness or loss of feeling of the face, or the face or eyelid drooping on one side. ? A - Arms. Signs are weakness or loss of feeling in an arm. This happens suddenly and usually on one side of the body. ? S - Speech. Signs are sudden trouble speaking, slurred speech, or trouble understanding what people say. ? T - Time. Time to call emergency services. Write down what time symptoms started.  You have other signs  of stroke, such as: ? A sudden, very bad headache with no known cause. ? Feeling sick to your stomach (nausea). ? Throwing up (vomiting). ? Jerky movements you cannot control (seizure).  SYMPTOMS OF A HEART ATTACK  What are the signs or symptoms? Symptoms of this condition include:  Chest pain. It may feel like: ? Crushing or squeezing. ? Tightness, pressure, fullness, or heaviness.  Pain in the arm, neck, jaw, back, or upper body.  Shortness of breath.  Heartburn.  Indigestion.  Nausea.  Cold sweats.  Feeling  tired.  Sudden lightheadedness.

## 2018-12-17 ENCOUNTER — Ambulatory Visit: Payer: Medicare Other | Admitting: Physician Assistant

## 2018-12-23 ENCOUNTER — Ambulatory Visit: Payer: Self-pay

## 2018-12-23 ENCOUNTER — Telehealth: Payer: Self-pay

## 2018-12-23 DIAGNOSIS — E1140 Type 2 diabetes mellitus with diabetic neuropathy, unspecified: Secondary | ICD-10-CM

## 2018-12-23 DIAGNOSIS — E1165 Type 2 diabetes mellitus with hyperglycemia: Secondary | ICD-10-CM

## 2018-12-23 DIAGNOSIS — IMO0002 Reserved for concepts with insufficient information to code with codable children: Secondary | ICD-10-CM

## 2018-12-23 NOTE — Chronic Care Management (AMB) (Signed)
   Chronic Care Management   Unsuccessful Call Note 12/23/2018 Name: Alexis Lloyd MRN: [id number] DOB: 1960/10/08  Alexis Lloyd is a 58 year old female primary care patient of Fenton Malling PA-C who was referred to the CCM program for assistance with medication management/assistance and DM/HTN disease management.Follow up phone call today todiscuss recent increase in A1C and to discuss barriers to self care related to her diabetes.Last office visit with Mar Daring, PA-C was 10/19/2018 for acute bacterial conjunctivitis.   Was unable to reach patient via telephone today for follow up. I have left HIPAA compliant voicemail asking patient to return my call. (unsuccessful outreach #1).   Plan: Will follow-up within 7 business days via telephone.   Alyanna Stoermer E. Rollene Rotunda, RN, BSN Nurse Care Coordinator [facility] Care Management [phone]

## 2019-01-05 ENCOUNTER — Other Ambulatory Visit: Payer: Self-pay

## 2019-01-05 ENCOUNTER — Telehealth: Payer: Self-pay | Admitting: Physician Assistant

## 2019-01-05 ENCOUNTER — Ambulatory Visit (INDEPENDENT_AMBULATORY_CARE_PROVIDER_SITE_OTHER): Payer: Medicare Other

## 2019-01-05 DIAGNOSIS — E7800 Pure hypercholesterolemia, unspecified: Secondary | ICD-10-CM | POA: Diagnosis not present

## 2019-01-05 DIAGNOSIS — E1140 Type 2 diabetes mellitus with diabetic neuropathy, unspecified: Secondary | ICD-10-CM | POA: Diagnosis not present

## 2019-01-05 DIAGNOSIS — E1165 Type 2 diabetes mellitus with hyperglycemia: Secondary | ICD-10-CM | POA: Diagnosis not present

## 2019-01-05 DIAGNOSIS — IMO0002 Reserved for concepts with insufficient information to code with codable children: Secondary | ICD-10-CM

## 2019-01-05 DIAGNOSIS — E1142 Type 2 diabetes mellitus with diabetic polyneuropathy: Secondary | ICD-10-CM

## 2019-01-05 MED ORDER — ONETOUCH VERIO W/DEVICE KIT: PACK | 0 refills | Status: DC

## 2019-01-05 MED ORDER — ONETOUCH VERIO VI STRP: ORAL_STRIP | 12 refills | Status: DC

## 2019-01-05 MED ORDER — ONETOUCH ULTRASOFT LANCETS MISC: 12 refills | Status: DC

## 2019-01-05 NOTE — Telephone Encounter (Signed)
-----   Message from Cathi Roan, [facility] sent at 01/05/2019  3:28 PM EDT ----- Regarding: Preferred [employer] glucometer Hey Jenni   I'm not allowed to put in orders, but Alexis Lloyd needs a new glucomter and supplies. Preferred brand on her [facility] plan is [place].   Thanks!   Ruben Reason, PharmD Clinical Pharmacist [place] [phone]

## 2019-01-05 NOTE — Chronic Care Management (AMB) (Signed)
Chronic Care Management   Follow Up Note   01/05/2019 Name: Alexis Lloyd MRN: [id number] DOB: [birth year]  Referred by: Alexis Daring, PA-C Lloyd for referral : Chronic Care Management (follow up DM)   Subjective: "I can't check my sugars because my meter is not working. Can I get a new meter and I want the freestyle that you scan"   Objective:  Assessment: Mrs. Alexis Lloyd is a 58 year old female primary care patient of Alexis Malling PA-C who was referred to the CCM program for assistance with medication management/assistance and DM/HTN disease management.Follow up phone call today todiscuss recent increase in A1C and to discuss barriers to self care related to her diabetes.Last office visit with Alexis Daring, PA-C was 10/19/2018 for acute bacterial conjunctivitis. Today RN CM followed up with patient to discuss ongoing DM self care management and barrier to self care.  Review of patient status, including review of consultants reports, relevant laboratory and other test results, and collaboration with appropriate care team members and the patient's provider was performed as part of comprehensive patient evaluation and provision of chronic care management services.    Goals Addressed            This Visit's Progress    "I want to get my A1C down to 7" (pt-stated)       [name] states she is doing well, she is working daily. She has not been able to check her sugars in several weeks. States her meter is broken and she needs a new one. She request a [employer] however after speaking with [facility] Pharmacist she will not qualify through her medicare benefits. [name] states she and her husband are trying to eat more healthy. They purchased and are using an air fryer however states she is still using pre packaged foods such as "tater tots" in the fryer. She is still drinking sodas (regular) but has switched from dark to light. She has been provided DM diet  education multiple times but states today "if I don't like it I am not going to eat it".  Current Barriers:   Covid-19 restrictions  Mild depression symptoms secondary to Covid-19 restrictions  Lack of social support from family  Nurse Case Manager Clinical Goal(s):   Over the next 3 months patient will lower her A1C to 7 through diet, exercise, medication compliance  Interventions:   Assessed for continued compliance with DM self care/management plan of care  Discussed barriers to lack of DM self care management (resolved)  Reinforced importance of outdoor activities as a way of improving mental health and decrease stress during this pandemic  Discussed need for new glucose monitor as hers is "broken"  Referred to Alexis Lloyd, Pharmacist for assistance with determining if patient qualifies for freestyle libre  Reinforced Covid-19 Wait, Wear, Wash strategies as more services begin to open  Provided patient with alternatives to purchasing deserts such as making deserts using sugar substitutes at home.  Encouraged patient to keep sugar free candy on hand to combat her "addiction" to sugar and deserts   Patient Self Care Activities:   Take medications as prescribed  Check sugars daily as directed an record  Exercise daily (walking)  Adheres to ADA diet and portion control  Remain engaged with [place] 19 infection prevention stratagies  Please see past updates related to this goal by clicking on the "Past Updates" button in the selected goal  Telephone follow up appointment with care management team member scheduled for: 14 days  Alexis Lloyd E. Alexis Rotunda, RN, BSN Nurse Care Coordinator [facility] [phone]

## 2019-01-05 NOTE — Telephone Encounter (Signed)
Sent in onetouch verio

## 2019-01-06 NOTE — Patient Instructions (Signed)
Thank you allowing the Chronic Care Management Team to be a part of your care! It was a pleasure speaking with you today!  1. Please considering following your ADA diet as we have discussed. You need to read food labels and limit your carbs to 45-60 grams at each meal and 15-30 grams for an evening snack 2. Please consider limiting your soda intake 3. Clinic Pharmacist will assist with getting you a new glucometer. Please call your pharmacy in 2 days to see if it is ready for pick up 4. Once you have your meter, please check your fasting blood sugars daily. 5. Take all your medications as prescribed and exercise daily  CCM (Chronic Care Management) Team   Trish Fountain RN, BSN Nurse Care Coordinator  [phone]  Ruben Reason PharmD  Clinical Pharmacist  [phone]   [place], LCSW Clinical Social Worker [phone]  Goals Addressed            This Visit's Progress   . "I want to get my A1C down to 7" (pt-stated)       Current Barriers:   Covid-19 restrictions  Mild depression symptoms secondary to Covid-19 restrictions  Lack of social support from family  Nurse Case Manager Clinical Goal(s):   Over the next 3 months patient will lower her A1C to 7 through diet, exercise, medication compliance  Interventions:   Assessed for continued compliance with DM self care/management plan of care . Discussed barriers to lack of DM self care management (resolved) . Reinforced importance of outdoor activities as a way of improving mental health and decrease stress during this pandemic . Discussed need for new glucose monitor as hers is "broken" . Referred to Ruben Reason, Pharmacist for assistance with determining if patient qualifies for freestyle libre . Reinforced Covid-19 Wait, Wear, Wash strategies as more services begin to open . Provided patient with alternatives to purchasing deserts such as making deserts using sugar substitutes at home. . Encouraged  patient to keep sugar free candy on hand to combat her "addiction" to sugar and deserts   Patient Self Care Activities:  . Take medications as prescribed . Check sugars daily as directed an record . Exercise daily (walking) . Adheres to ADA diet and portion control . Remain engaged with [place] . Follows Covid 19 infection prevention stratagies  Please see past updates related to this goal by clicking on the "Past Updates" button in the selected goal          The patient verbalized understanding of instructions provided today and declined a print copy of patient instruction materials.   Telephone follow up appointment with care management team member scheduled for: 2 weeks  SYMPTOMS OF A STROKE   You have any symptoms of stroke. "BE FAST" is an easy way to remember the main warning signs: ? B - Balance. Signs are dizziness, sudden trouble walking, or loss of balance. ? E - Eyes. Signs are trouble seeing or a sudden change in how you see. ? F - Face. Signs are sudden weakness or loss of feeling of the face, or the face or eyelid drooping on one side. ? A - Arms. Signs are weakness or loss of feeling in an arm. This happens suddenly and usually on one side of the body. ? S - Speech. Signs are sudden trouble speaking, slurred speech, or trouble understanding what people say. ? T - Time. Time to call emergency services. Write down what time symptoms started.  You have other signs of stroke, such as: ? A sudden, very bad headache with no known cause. ? Feeling sick to your stomach (nausea). ? Throwing up (vomiting). ? Jerky movements you cannot control (seizure).  SYMPTOMS OF A HEART ATTACK  What are the signs or symptoms? Symptoms of this condition include:  Chest pain. It may feel like: ? Crushing or squeezing. ? Tightness, pressure, fullness, or heaviness.  Pain in the arm, neck, jaw, back, or upper body.  Shortness of  breath.  Heartburn.  Indigestion.  Nausea.  Cold sweats.  Feeling tired.  Sudden lightheadedness.

## 2019-01-20 ENCOUNTER — Other Ambulatory Visit: Payer: Self-pay

## 2019-01-20 ENCOUNTER — Ambulatory Visit: Payer: Self-pay

## 2019-01-20 DIAGNOSIS — E1142 Type 2 diabetes mellitus with diabetic polyneuropathy: Secondary | ICD-10-CM

## 2019-01-20 NOTE — Chronic Care Management (AMB) (Signed)
Chronic Care Management   Follow Up Note   01/20/2019 Name: Alexis Lloyd MRN: [id number] DOB: 11-16-1960  Referred by: Mar Daring, PA-C Reason for referral : Chronic Care Management (follow up)   Subjective: "I have started walking every day so hopefully that will help lower my A1C"   Objective:  Lab Results  Component Value Date   HGBA1C 8.9 (H) 09/16/2018   HGBA1C 8.4 (A) 06/16/2018   HGBA1C 13.8 (A) 03/02/2018   Lab Results  Component Value Date   MICROALBUR 50 08/31/2017   LDLCALC 163 (H) 09/16/2018   CREATININE 0.72 09/16/2018     Assessment: Alexis Lloyd is a 58 year old female primary care patient of Alexis Malling PA-C who was referred to the CCM program for assistance with medication management/assistance and DM/HTN disease management.Her main focus is DM as her A1C recently increased from 8.4 to 8.9.  Review of patient status, including review of consultants reports, relevant laboratory and other test results, and collaboration with appropriate care team members and the patient's provider was performed as part of comprehensive patient evaluation and provision of chronic care management services.    Goals Addressed            This Visit's Progress   . "I want to get my A1C down to 7" (pt-stated)       Alexis Lloyd states she continues to do "better" with managing her DM. She has struggled since the [place] pandemic with mild depression which cause her to revert back to old habits including not exercising, drinking regular sodas, not checking her sugars, and eating sweets.   Over the last 2 weeks, she has reduced her soda intake, tries to walk daily for 1 mile, using an airfryer to prepare more healthy meals at home, and limiting her sugary snacks and deserts. She did pick up her new glucometer and is checking her fasting sugars daily. Reports highest reading of 167 and lowest of 132. Reports average sugars are in the 130s and 140s. She continues to  take all medications as prescribed. She has a PCP appointment tomorrow for A1C check and is very anxious about pending results. She now has the lifestyle change support from her husband as he has been told he needs to improve his health. They are making change together.  Current Barriers:   Covid-19 restrictions  Mild depression symptoms secondary to Covid-19 restrictions  Lack of social support from family  Nurse Case Manager Clinical Goal(s):   Over the next 3 months patient will lower her A1C to 7 through diet, exercise, medication compliance  Interventions:   Assessed for continued compliance with DM self care/management plan of care . Reinforced importance of outdoor activities as a way of improving mental health and decrease stress during this pandemic . Assessed for receipt and utilization of new glucometer . Reviewed fasting glucose log . Reinforced Covid-19 Wait, Wear, Wash strategies as more services begin to open . 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 Take medications as prescribed . Check sugars daily as directed an record . Exercise daily (walking) . Adheres to ADA diet and portion control . Remain engaged with [place] . Follows Covid 19 infection prevention stratagies  Please see past updates related to this goal by clicking on the "Past Updates" button in the selected goal           The  care management team will reach out to the patient again over the next 30 days.    Alexis Lloyd E. Rollene Rotunda, RN, BSN Nurse Care Coordinator [facility] Care Management [phone]

## 2019-01-20 NOTE — Patient Instructions (Addendum)
Thank you allowing the Chronic Care Management Team to be a part of your care! It was a pleasure speaking with you today!  1. Please continue following your ADA diet as we have discussed. You need to read food labels and limit your carbs to 45-60 grams at each meal and 15-30 grams for an evening snack 2. Please consider limiting your soda intake 3. Continue to check your fasting blood sugars daily and record. Take you meter with you to your appointment tomorrow. 4. Take all your medications as prescribed and exercise daily 5. Continue to walk daily as weather permits. I am very proud of you!!  CCM (Chronic Care Management) Team   Trish Fountain RN, BSN Nurse Care Coordinator  [phone]  Ruben Reason PharmD  Clinical Pharmacist  [phone]   Elliot Gurney, LCSW Clinical Social Worker [phone]  Goals Addressed            This Visit's Progress   . "I want to get my A1C down to 7" (pt-stated)       Current Barriers:   Covid-19 restrictions  Mild depression symptoms secondary to Covid-19 restrictions  Lack of social support from family  Nurse Case Manager Clinical Goal(s):   Over the next 3 months patient will lower her A1C to 7 through diet, exercise, medication compliance  Interventions:   Assessed for continued compliance with DM self care/management plan of care . Reinforced importance of outdoor activities as a way of improving mental health and decrease stress during this pandemic . Assessed for receipt and utilization of new glucometer . Reviewed fasting glucose log . Reinforced Covid-19 Wait, Wear, Wash strategies as more services begin to open . 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 Take medications as prescribed . Check sugars daily as directed an record . Exercise daily (walking) . Adheres to ADA diet and portion control . Remain engaged with  [place] . Follows Covid 19 infection prevention stratagies  Please see past updates related to this goal by clicking on the "Past Updates" button in the selected goal          The patient verbalized understanding of instructions provided today and declined a print copy of patient instruction materials.   The care management team will reach out to the patient again over the next 30 days.   SYMPTOMS OF A STROKE   You have any symptoms of stroke. "BE FAST" is an easy way to remember the main warning signs: ? B - Balance. Signs are dizziness, sudden trouble walking, or loss of balance. ? E - Eyes. Signs are trouble seeing or a sudden change in how you see. ? F - Face. Signs are sudden weakness or loss of feeling of the face, or the face or eyelid drooping on one side. ? A - Arms. Signs are weakness or loss of feeling in an arm. This happens suddenly and usually on one side of the body. ? S - Speech. Signs are sudden trouble speaking, slurred speech, or trouble understanding what people say. ? T - Time. Time to call emergency services. Write down what time symptoms started.  You have other signs of stroke, such as: ? A sudden, very bad headache with no known cause. ? Feeling sick to your stomach (nausea). ? Throwing up (vomiting). ? Jerky movements you cannot control (seizure).  SYMPTOMS OF A HEART ATTACK  What are the signs or symptoms? Symptoms of this condition include:  Chest pain. It may feel like: ? Crushing or squeezing. ? Tightness, pressure, fullness, or heaviness.  Pain in the arm, neck, jaw, back, or upper body.  Shortness of breath.  Heartburn.  Indigestion.  Nausea.  Cold sweats.  Feeling tired.  Sudden lightheadedness.

## 2019-01-20 NOTE — Progress Notes (Signed)
Patient: Alexis Lloyd Female    DOB: May 24, 1961   58 y.o.   MRN: [id number] Visit Date: 01/21/2019  Today's Provider: Mar Daring, PA-C   Chief Complaint  Patient presents with  . Follow-up  . Diabetes  . Hypertension  . Hyperlipidemia   Subjective:     HPI    Diabetes Mellitus Type II, Follow-up:   Lab Results  Component Value Date   HGBA1C 8.6 (A) 01/21/2019   HGBA1C 8.9 (H) 09/16/2018   HGBA1C 8.4 (A) 06/16/2018   Last seen for diabetes 4 months ago.  Management since then includes; labs checked. Advised to keep working on limiting carbohydrates and sugars from diet . She reports fair compliance with treatment. She is not having side effects. none Current symptoms include none and have been unchanged. Home blood sugar records: fasting range: 167  Episodes of hypoglycemia? no   Current Insulin Regimen: n/a Most Recent Eye Exam: app t8/26/2020 Weight trend: stable Prior visit with dietician: no Current diet: well balanced Current exercise: walking  -----------------------------------------------------------   Hypertension, follow-up:  BP Readings from Last 3 Encounters:  01/21/19 103/66  10/19/18 118/72  09/16/18 137/79    She was last seen for hypertension 4 months ago.  BP at that visit was 137/79. Management since that visit includes; labs checked, no changes.She reports good compliance with treatment. She is not having side effects. none She is exercising. She is not adherent to low salt diet.   Outside blood pressures are 130/80. She is experiencing none.  Patient denies none.   Cardiovascular risk factors include diabetes mellitus.  Use of agents associated with hypertension: none.   -----------------------------------------------------------    Lipid/Cholesterol, Follow-up:   Last seen for this 4 months ago.  Management since that visit includes; labs checked. recommended [place] start a cholesterol lowering medication to  protect you from having a cardiovascular event (I.e. heart attack or stroke). Per patient if the labs on her next follow-up appointments are not any better, she will agree to the lowering cholesterol medication. For now she is going to work on her diet.  Last Lipid Panel:    Component Value Date/Time   CHOL 228 (H) 09/16/2018 1112   TRIG 166 (H) 09/16/2018 1112   HDL 32 (L) 09/16/2018 1112   CHOLHDL 7.1 (H) 09/16/2018 1112   CHOLHDL 5.2 08/28/2016 1050   VLDL 21 08/28/2016 1050   LDLCALC 163 (H) 09/16/2018 1112    She reports good compliance with treatment. She is not having side effects. none  Wt Readings from Last 3 Encounters:  01/21/19 217 lb (98.4 kg)  10/19/18 216 lb (98 kg)  09/16/18 217 lb 12.8 oz (98.8 kg)    -----------------------------------------------------------    Allergies  Allergen Reactions  . Allegra [Fexofenadine] Nausea And Vomiting  . Avelox [Moxifloxacin] Other (See Comments)    Hallucinations  . Bactrim [Sulfamethoxazole-Trimethoprim] Other (See Comments)    unknown  . Etodolac Nausea Only  . Fish-Derived Products Nausea And Vomiting    With "seafood"  . Penicillins Diarrhea and Nausea And Vomiting  . Shellfish Allergy Nausea Only  . Sulfa Antibiotics Other (See Comments)    unknown  . Macrobid [employer] Macro] Other (See Comments)    Constipation and globus hystericus.     Current Outpatient Medications:  .  albuterol (PROVENTIL HFA) 108 (90 Base) MCG/ACT inhaler, Inhale 2 puffs every 6 (six) hours as needed into the lungs., Disp: 3 Inhaler, Rfl: 1 .  Blood Glucose Monitoring Suppl (ONETOUCH VERIO) w/Device KIT, To check blood sugar once daily, Disp: 1 kit, Rfl: 0 .  cetirizine (ZYRTEC) 10 MG tablet, Take 1 tablet (10 mg total) by mouth daily., Disp: 90 tablet, Rfl: 1 .  glucose blood (ONETOUCH VERIO) test strip, To check blood sugar once daily, Disp: 100 each, Rfl: 12 .  ketoconazole (NIZORAL) 2 % cream, APPLY CREAM TOPICALLY  TWICE DAILY TO FEET, Disp: , Rfl: 2 .  Lancets (ONETOUCH ULTRASOFT) lancets, USE LANCET(S)  TO CHECK GLUCOSE DAILY, Disp: 100 each, Rfl: 12 .  lisinopril-hydrochlorothiazide (ZESTORETIC) 20-12.5 MG tablet, Take 2 tablets by mouth daily., Disp: 180 tablet, Rfl: 1 .  metFORMIN (GLUCOPHAGE) 1000 MG tablet, Take 1 tablet (1,000 mg total) by mouth 2 (two) times daily with a meal., Disp: 180 tablet, Rfl: 1 .  montelukast (SINGULAIR) 10 MG tablet, Take 1 tablet (10 mg total) by mouth daily., Disp: 30 tablet, Rfl: 11 .  sitaGLIPtin (JANUVIA) 100 MG tablet, Take 1 tablet (100 mg total) by mouth daily., Disp: 90 tablet, Rfl: 1 .  spironolactone (ALDACTONE) 25 MG tablet, Take 1 tablet (25 mg total) by mouth daily., Disp: 90 tablet, Rfl: 1 .  verapamil (CALAN) 80 MG tablet, Take 1 tablet (80 mg total) by mouth 2 (two) times daily., Disp: 180 tablet, Rfl: 1 .  ofloxacin (OCUFLOX) 0.3 % ophthalmic solution, Place 1 drop into both eyes 4 (four) times daily. X 5-7 days (Patient not taking: Reported on 01/21/2019), Disp: 5 mL, Rfl: 0  Review of Systems  Constitutional: Negative for appetite change, chills, fatigue and fever.  Respiratory: Negative for chest tightness and shortness of breath.   Cardiovascular: Negative for chest pain and palpitations.  Gastrointestinal: Negative for abdominal pain, nausea and vomiting.  Endocrine: Negative for polydipsia, polyphagia and polyuria.  Neurological: Negative for dizziness and weakness.    Social History   Tobacco Use  . Smoking status: Former Smoker    Packs/day: 0.25    Years: 21.00    Pack years: 5.25    Quit date: 06/08/1997    Years since quitting: 21.6  . Smokeless tobacco: Never Used  Substance Use Topics  . Alcohol use: No      Objective:   BP 103/66 (BP Location: Right Arm, Patient Position: Sitting, Cuff Size: Large)   Pulse 80   Temp (!) 97.3 F (36.3 C) (Temporal)   Resp 16   Ht _0  (1.499 m)   Wt 217 lb (98.4 kg)   SpO2 98%   BMI  43.83 kg/m  Vitals:   01/21/19 1009  BP: 103/66  Pulse: 80  Resp: 16  Temp: (!) 97.3 F (36.3 C)  TempSrc: Temporal  SpO2: 98%  Weight: 217 lb (98.4 kg)  Height: _1  (1.499 m)     Physical Exam Vitals signs reviewed.  Constitutional:      General: She is not in acute distress.    Appearance: Normal appearance. She is well-developed. She is obese. She is not ill-appearing or diaphoretic.  Neck:     Musculoskeletal: Normal range of motion and neck supple.  Cardiovascular:     Rate and Rhythm: Normal rate and regular rhythm.     Heart sounds: Normal heart sounds. No murmur. No friction rub. No gallop.   Pulmonary:     Effort: Pulmonary effort is normal. No respiratory distress.     Breath sounds: Normal breath sounds. No wheezing or rales.  Neurological:     Mental Status: She is  alert.    Diabetic Foot Exam - Simple   Simple Foot Form Diabetic Foot exam was performed with the following findings: Yes 01/21/2019  1:10 PM  Visual Inspection No deformities, no ulcerations, no other skin breakdown bilaterally: Yes See comments: Yes Sensation Testing Intact to touch and monofilament testing bilaterally: Yes Pulse Check Posterior Tibialis and Dorsalis pulse intact bilaterally: Yes Comments Psoriasis noted on sides of feet bialterally      Results for orders placed or performed in visit on 01/21/19  POCT glycosylated hemoglobin (Hb A1C)  Result Value Ref Range   Hemoglobin A1C 8.6 (A) 4.0 - 5.6 %   Est. average glucose Bld gHb Est-mCnc 200        Assessment & Plan    1. Type 2 diabetes mellitus with diabetic polyneuropathy, without long-term current use of insulin (HCC) A1c improved. Continue current medical treatment plan. I will see her back in 3 months.    Mar Daring, PA-C  [facility]

## 2019-01-21 ENCOUNTER — Ambulatory Visit (INDEPENDENT_AMBULATORY_CARE_PROVIDER_SITE_OTHER): Payer: Medicare Other | Admitting: Physician Assistant

## 2019-01-21 ENCOUNTER — Encounter: Payer: Self-pay | Admitting: Physician Assistant

## 2019-01-21 VITALS — BP 103/66 | HR 80 | Temp 97.3°F | Resp 16 | Ht 59.0 in | Wt 217.0 lb

## 2019-01-21 DIAGNOSIS — E1142 Type 2 diabetes mellitus with diabetic polyneuropathy: Secondary | ICD-10-CM | POA: Diagnosis not present

## 2019-01-21 LAB — POCT GLYCOSYLATED HEMOGLOBIN (HGB A1C)
Est. average glucose Bld gHb Est-mCnc: 200
Hemoglobin A1C: 8.6 % — AB (ref 4.0–5.6)

## 2019-01-21 NOTE — Patient Instructions (Signed)

## 2019-03-15 ENCOUNTER — Other Ambulatory Visit: Payer: Self-pay | Admitting: Physician Assistant

## 2019-03-15 DIAGNOSIS — E1165 Type 2 diabetes mellitus with hyperglycemia: Secondary | ICD-10-CM

## 2019-03-15 DIAGNOSIS — E1140 Type 2 diabetes mellitus with diabetic neuropathy, unspecified: Secondary | ICD-10-CM

## 2019-03-15 DIAGNOSIS — I10 Essential (primary) hypertension: Secondary | ICD-10-CM

## 2019-03-15 DIAGNOSIS — IMO0002 Reserved for concepts with insufficient information to code with codable children: Secondary | ICD-10-CM

## 2019-03-29 NOTE — Progress Notes (Signed)
Patient: Alexis Lloyd Female    DOB: [birth year]   58 y.o.   MRN: [id number] Visit Date: 03/30/2019  Today's Provider: Mar Daring, PA-C   Chief Complaint  Patient presents with  . URI   Subjective:    Virtual Visit via Telephone Note  I connected with Alexis Lloyd Patient on 03/30/19 at  8:40 AM EDT by telephone and verified that I am speaking with the correct person using two identifiers.  Location: Patient: home Provider: [facility]   I discussed the limitations, risks, security and privacy concerns of performing an evaluation and management service by telephone and the availability of in person appointments. I also discussed with the patient that there may be a patient responsible charge related to this service. The patient expressed understanding and agreed to proceed.   URI  This is a new problem. The current episode started in the past 7 days. The problem has been unchanged. There has been no fever. Associated symptoms include congestion, coughing and sinus pain. Pertinent negatives include no abdominal pain, chest pain, ear pain, headaches, nausea, plugged ear sensation, rhinorrhea, sneezing, sore throat or wheezing. She has tried nothing for the symptoms.  She has been taking her zyrtec and flonase and symptoms continued to progress. She has had sinus pain and green, thick mucous production for the last 7 days.     Allergies  Allergen Reactions  . Allegra [Fexofenadine] Nausea And Vomiting  . Avelox [Moxifloxacin] Other (See Comments)    Hallucinations  . Bactrim [Sulfamethoxazole-Trimethoprim] Other (See Comments)    unknown  . Etodolac Nausea Only  . Fish-Derived Products Nausea And Vomiting    With "seafood"  . Penicillins Diarrhea and Nausea And Vomiting  . Shellfish Allergy Nausea Only  . Sulfa Antibiotics Other (See Comments)    unknown  . Macrobid [employer] Macro] Other (See Comments)    Constipation and globus hystericus.      Current Outpatient Medications:  .  albuterol (PROVENTIL HFA) 108 (90 Base) MCG/ACT inhaler, Inhale 2 puffs every 6 (six) hours as needed into the lungs., Disp: 3 Inhaler, Rfl: 1 .  Blood Glucose Monitoring Suppl (ONETOUCH VERIO) w/Device KIT, To check blood sugar once daily, Disp: 1 kit, Rfl: 0 .  cetirizine (ZYRTEC) 10 MG tablet, Take 1 tablet (10 mg total) by mouth daily., Disp: 90 tablet, Rfl: 1 .  glucose blood (ONETOUCH VERIO) test strip, To check blood sugar once daily, Disp: 100 each, Rfl: 12 .  ketoconazole (NIZORAL) 2 % cream, APPLY CREAM TOPICALLY TWICE DAILY TO FEET, Disp: , Rfl: 2 .  Lancets (ONETOUCH ULTRASOFT) lancets, USE LANCET(S)  TO CHECK GLUCOSE DAILY, Disp: 100 each, Rfl: 12 .  lisinopril-hydrochlorothiazide (ZESTORETIC) 20-12.5 MG tablet, TAKE 2 TABLETS BY MOUTH  DAILY, Disp: 180 tablet, Rfl: 3 .  metFORMIN (GLUCOPHAGE) 1000 MG tablet, TAKE 1 TABLET BY MOUTH  TWICE DAILY WITH A MEAL, Disp: 180 tablet, Rfl: 3 .  montelukast (SINGULAIR) 10 MG tablet, Take 1 tablet (10 mg total) by mouth daily., Disp: 30 tablet, Rfl: 11 .  Multiple Vitamins-Minerals (PRESERVISION AREDS 2) CHEW, Chew by mouth 2 (two) times daily., Disp: , Rfl:  .  sitaGLIPtin (JANUVIA) 100 MG tablet, Take 1 tablet (100 mg total) by mouth daily., Disp: 90 tablet, Rfl: 1 .  spironolactone (ALDACTONE) 25 MG tablet, TAKE 1 TABLET BY MOUTH  DAILY, Disp: 90 tablet, Rfl: 3 .  verapamil (CALAN) 80 MG tablet, Take 1 tablet (80 mg total) by  mouth 2 (two) times daily., Disp: 180 tablet, Rfl: 1 .  ofloxacin (OCUFLOX) 0.3 % ophthalmic solution, Place 1 drop into both eyes 4 (four) times daily. X 5-7 days (Patient not taking: Reported on 01/21/2019), Disp: 5 mL, Rfl: 0  Review of Systems  Constitutional: Negative for fever.  HENT: Positive for congestion, postnasal drip, sinus pressure and sinus pain. Negative for ear pain, rhinorrhea, sneezing, sore throat, trouble swallowing and voice change.   Respiratory: Positive  for cough. Negative for chest tightness, shortness of breath and wheezing.   Cardiovascular: Negative for chest pain, palpitations and leg swelling.  Gastrointestinal: Negative for abdominal pain and nausea.  Neurological: Negative for dizziness and headaches.    Social History   Tobacco Use  . Smoking status: Former Smoker    Packs/day: 0.25    Years: 21.00    Pack years: 5.25    Quit date: 06/08/1997    Years since quitting: 21.8  . Smokeless tobacco: Never Used  Substance Use Topics  . Alcohol use: No      Objective:   There were no vitals taken for this visit. There were no vitals filed for this visit.There is no height or weight on file to calculate BMI.   Physical Exam Vitals signs reviewed.  Constitutional:      General: She is not in acute distress. Pulmonary:     Effort: Pulmonary effort is normal. No respiratory distress.  Neurological:     Mental Status: She is alert.      No results found for any visits on 03/30/19.     Assessment & Plan     1. Acute non-recurrent pansinusitis Worsening symptoms that have not responded to OTC medications. Will give azithromycin as below. Continue allergy medications. Stay well hydrated and get plenty of rest. Call if no symptom improvement or if symptoms worsen. - azithromycin (ZITHROMAX) 250 MG tablet; Take 2 tablets PO on day one, and one tablet PO daily thereafter until completed.  Dispense: 6 tablet; Refill: 0   I discussed the assessment and treatment plan with the patient. The patient was provided an opportunity to ask questions and all were answered. The patient agreed with the plan and demonstrated an understanding of the instructions.   The patient was advised to call back or seek an in-person evaluation if the symptoms worsen or if the condition fails to improve as anticipated.  I provided 11 minutes of non-face-to-face time during this encounter.    Mar Daring, PA-C  [facility]

## 2019-03-30 ENCOUNTER — Ambulatory Visit (INDEPENDENT_AMBULATORY_CARE_PROVIDER_SITE_OTHER): Payer: Medicare Other | Admitting: Physician Assistant

## 2019-03-30 ENCOUNTER — Encounter: Payer: Self-pay | Admitting: Physician Assistant

## 2019-03-30 DIAGNOSIS — J0140 Acute pansinusitis, unspecified: Secondary | ICD-10-CM | POA: Diagnosis not present

## 2019-03-30 MED ORDER — AZITHROMYCIN 250 MG PO TABS: ORAL_TABLET | ORAL | 0 refills | Status: DC

## 2019-04-25 ENCOUNTER — Ambulatory Visit: Payer: Self-pay | Admitting: Physician Assistant

## 2019-04-28 ENCOUNTER — Other Ambulatory Visit: Payer: Self-pay

## 2019-04-28 ENCOUNTER — Encounter: Payer: Self-pay | Admitting: Physician Assistant

## 2019-04-28 ENCOUNTER — Ambulatory Visit (INDEPENDENT_AMBULATORY_CARE_PROVIDER_SITE_OTHER): Payer: Medicare Other | Admitting: Physician Assistant

## 2019-04-28 VITALS — BP 134/80 | HR 64 | Temp 96.6°F | Wt 220.0 lb

## 2019-04-28 DIAGNOSIS — E1140 Type 2 diabetes mellitus with diabetic neuropathy, unspecified: Secondary | ICD-10-CM

## 2019-04-28 DIAGNOSIS — E66813 Obesity, class 3: Secondary | ICD-10-CM

## 2019-04-28 DIAGNOSIS — E1165 Type 2 diabetes mellitus with hyperglycemia: Secondary | ICD-10-CM

## 2019-04-28 DIAGNOSIS — IMO0002 Reserved for concepts with insufficient information to code with codable children: Secondary | ICD-10-CM

## 2019-04-28 DIAGNOSIS — I10 Essential (primary) hypertension: Secondary | ICD-10-CM

## 2019-04-28 DIAGNOSIS — E7800 Pure hypercholesterolemia, unspecified: Secondary | ICD-10-CM

## 2019-04-28 DIAGNOSIS — Z6841 Body Mass Index (BMI) 40.0 and over, adult: Secondary | ICD-10-CM

## 2019-04-28 LAB — POCT GLYCOSYLATED HEMOGLOBIN (HGB A1C): Hemoglobin A1C: 8 % — AB (ref 4.0–5.6)

## 2019-04-28 NOTE — Patient Instructions (Signed)

## 2019-04-28 NOTE — Progress Notes (Signed)
Patient: Alexis Lloyd Female    DOB: [birth year]   58 y.o.   MRN: [id number] Visit Date: 04/28/2019  Today's Provider: Mar Daring, PA-C   No chief complaint on file.  Subjective:     HPI     Diabetes Mellitus Type II, Follow-up:   Lab Results  Component Value Date   HGBA1C 8.0 (A) 04/28/2019   HGBA1C 8.6 (A) 01/21/2019   HGBA1C 8.9 (H) 09/16/2018   Last seen for diabetes 3 months ago.  Management since then includes No changes. She reports excellent compliance with treatment. She is not having side effects.  Current symptoms include none and have been stable. Home blood sugar records: fasting range: 130's  Episodes of hypoglycemia? no   Current Insulin Regimen: None Most Recent Eye Exam: 02/2019 Weight trend: stable Current diet: in general, a "healthy" diet   Current exercise: none  ------------------------------------------------------------------------   Hypertension, follow-up:  BP Readings from Last 3 Encounters:  04/28/19 134/80  01/21/19 103/66  10/19/18 118/72    She was last seen for hypertension 3 months ago.  BP at that visit was 103/66. Management since that visit includes no changes She reports excellent compliance with treatment. She is not having side effects.  She is not exercising. She is adherent to low salt diet.   Outside blood pressures are not being checked. She is experiencing none.  Patient denies chest pressure/discomfort, dyspnea and exertional chest pressure/discomfort.   Cardiovascular risk factors include diabetes mellitus, dyslipidemia, hypertension and obesity (BMI >= 30 kg/m2).  Use of agents associated with hypertension: none.   ------------------------------------------------------------------------    Lipid/Cholesterol, Follow-up:   Last seen for this 3 months ago.  Management since that visit includes No changes.  Last Lipid Panel:    Component Value Date/Time   CHOL 228 (H) 09/16/2018  1112   TRIG 166 (H) 09/16/2018 1112   HDL 32 (L) 09/16/2018 1112   CHOLHDL 7.1 (H) 09/16/2018 1112   CHOLHDL 5.2 08/28/2016 1050   VLDL 21 08/28/2016 1050   LDLCALC 163 (H) 09/16/2018 1112    She reports excellent compliance with treatment. She is not having side effects.   Wt Readings from Last 3 Encounters:  04/28/19 220 lb (99.8 kg)  01/21/19 217 lb (98.4 kg)  10/19/18 216 lb (98 kg)   ------------------------------------------------------------------------    Allergies  Allergen Reactions  . Allegra [Fexofenadine] Nausea And Vomiting  . Avelox [Moxifloxacin] Other (See Comments)    Hallucinations  . Bactrim [Sulfamethoxazole-Trimethoprim] Other (See Comments)    unknown  . Etodolac Nausea Only  . Fish-Derived Products Nausea And Vomiting    With "seafood"  . Penicillins Diarrhea and Nausea And Vomiting  . Shellfish Allergy Nausea Only  . Sulfa Antibiotics Other (See Comments)    unknown  . Macrobid [employer] Macro] Other (See Comments)    Constipation and globus hystericus.     Current Outpatient Medications:  .  albuterol (PROVENTIL HFA) 108 (90 Base) MCG/ACT inhaler, Inhale 2 puffs every 6 (six) hours as needed into the lungs., Disp: 3 Inhaler, Rfl: 1 .  azithromycin (ZITHROMAX) 250 MG tablet, Take 2 tablets PO on day one, and one tablet PO daily thereafter until completed., Disp: 6 tablet, Rfl: 0 .  Blood Glucose Monitoring Suppl (ONETOUCH VERIO) w/Device KIT, To check blood sugar once daily, Disp: 1 kit, Rfl: 0 .  cetirizine (ZYRTEC) 10 MG tablet, Take 1 tablet (10 mg total) by mouth daily., Disp: 90 tablet, Rfl:  1 .  glucose blood (ONETOUCH VERIO) test strip, To check blood sugar once daily, Disp: 100 each, Rfl: 12 .  ketoconazole (NIZORAL) 2 % cream, APPLY CREAM TOPICALLY TWICE DAILY TO FEET, Disp: , Rfl: 2 .  Lancets (ONETOUCH ULTRASOFT) lancets, USE LANCET(S)  TO CHECK GLUCOSE DAILY, Disp: 100 each, Rfl: 12 .  lisinopril-hydrochlorothiazide  (ZESTORETIC) 20-12.5 MG tablet, TAKE 2 TABLETS BY MOUTH  DAILY, Disp: 180 tablet, Rfl: 3 .  metFORMIN (GLUCOPHAGE) 1000 MG tablet, TAKE 1 TABLET BY MOUTH  TWICE DAILY WITH A MEAL, Disp: 180 tablet, Rfl: 3 .  montelukast (SINGULAIR) 10 MG tablet, Take 1 tablet (10 mg total) by mouth daily., Disp: 30 tablet, Rfl: 11 .  Multiple Vitamins-Minerals (PRESERVISION AREDS 2) CHEW, Chew by mouth 2 (two) times daily., Disp: , Rfl:  .  sitaGLIPtin (JANUVIA) 100 MG tablet, Take 1 tablet (100 mg total) by mouth daily., Disp: 90 tablet, Rfl: 1 .  spironolactone (ALDACTONE) 25 MG tablet, TAKE 1 TABLET BY MOUTH  DAILY, Disp: 90 tablet, Rfl: 3 .  verapamil (CALAN) 80 MG tablet, Take 1 tablet (80 mg total) by mouth 2 (two) times daily., Disp: 180 tablet, Rfl: 1  Review of Systems  Constitutional: Negative.   Respiratory: Negative.   Cardiovascular: Negative.   Gastrointestinal: Negative.   Endocrine: Negative.   Musculoskeletal: Negative.   Neurological: Negative for dizziness, light-headedness and headaches.    Social History   Tobacco Use  . Smoking status: Former Smoker    Packs/day: 0.25    Years: 21.00    Pack years: 5.25    Quit date: 06/08/1997    Years since quitting: 21.9  . Smokeless tobacco: Never Used  Substance Use Topics  . Alcohol use: No      Objective:   BP 134/80 (BP Location: Right Arm, Patient Position: Sitting, Cuff Size: Large)   Pulse 64   Temp (!) 96.6 F (35.9 C) (Temporal)   Wt 220 lb (99.8 kg)   BMI 44.43 kg/m  Vitals:   04/28/19 1046  BP: 134/80  Pulse: 64  Temp: (!) 96.6 F (35.9 C)  TempSrc: Temporal  Weight: 220 lb (99.8 kg)  Body mass index is 44.43 kg/m.   Physical Exam Vitals signs reviewed.  Constitutional:      General: She is not in acute distress.    Appearance: Normal appearance. She is well-developed. She is obese. She is not ill-appearing or diaphoretic.  Neck:     Musculoskeletal: Normal range of motion and neck supple.    Cardiovascular:     Rate and Rhythm: Normal rate and regular rhythm.     Pulses: Normal pulses.     Heart sounds: Normal heart sounds. No murmur. No friction rub. No gallop.   Pulmonary:     Effort: Pulmonary effort is normal. No respiratory distress.     Breath sounds: Normal breath sounds. No wheezing or rales.  Neurological:     Mental Status: She is alert.      Results for orders placed or performed in visit on 04/28/19  POCT HgB A1C  Result Value Ref Range   Hemoglobin A1C 8.0 (A) 4.0 - 5.6 %       Assessment & Plan    1. DM type 2, uncontrolled, with neuropathy (HCC) A1c continues to improve and is down from 8.6 to 8.0. Continue Metformin 1032m BID and Januvia 1064mdaily. I will see her back in 3 months for f/u.   2. Essential hypertension Stable. Continue lisinopril-hctz  20-12.81m BID and Verapamil 851mBID.   3. Hypercholesteremia Stable and doet controlled. Patient has declined statins in the past.   4. Class 3 severe obesity due to excess calories with serious comorbidity and body mass index (BMI) of 40.0 to 44.9 in adult ([facility]sel[facility] on healthy lifestyle modifications including dieting and exercise.      JeMar DaringPA-C  [facility]

## 2019-05-25 NOTE — Progress Notes (Signed)
Patient: Alexis Lloyd Female    DOB: 1961/01/24   58 y.o.   MRN: [id number] Visit Date: 05/26/2019  Today's Provider: Mar Daring, PA-C   No chief complaint on file.  Subjective:     HPI  Patient here with c/o rash in the middle of the back. Reports that at first it was inside of the skin but now is on top of the skin for the past 2 days. Reports that her husband told her it looks like a boil that has "busted" on its own. Reports that it itches and sometimes it burns. She gets some shooting pain sometimes. Reports that this has been going on about two weeks inside her skin. Treatments tried: none.   Allergies  Allergen Reactions  . Allegra [Fexofenadine] Nausea And Vomiting  . Avelox [Moxifloxacin] Other (See Comments)    Hallucinations  . Bactrim [Sulfamethoxazole-Trimethoprim] Other (See Comments)    unknown  . Etodolac Nausea Only  . Fish-Derived Products Nausea And Vomiting    With "seafood"  . Penicillins Diarrhea and Nausea And Vomiting  . Shellfish Allergy Nausea Only  . Sulfa Antibiotics Other (See Comments)    unknown  . Macrobid [employer] Macro] Other (See Comments)    Constipation and globus hystericus.     Current Outpatient Medications:  .  albuterol (PROVENTIL HFA) 108 (90 Base) MCG/ACT inhaler, Inhale 2 puffs every 6 (six) hours as needed into the lungs., Disp: 3 Inhaler, Rfl: 1 .  Blood Glucose Monitoring Suppl (ONETOUCH VERIO) w/Device KIT, To check blood sugar once daily, Disp: 1 kit, Rfl: 0 .  cetirizine (ZYRTEC) 10 MG tablet, Take 1 tablet (10 mg total) by mouth daily., Disp: 90 tablet, Rfl: 1 .  glucose blood (ONETOUCH VERIO) test strip, To check blood sugar once daily, Disp: 100 each, Rfl: 12 .  ketoconazole (NIZORAL) 2 % cream, APPLY CREAM TOPICALLY TWICE DAILY TO FEET, Disp: , Rfl: 2 .  Lancets (ONETOUCH ULTRASOFT) lancets, USE LANCET(S)  TO CHECK GLUCOSE DAILY, Disp: 100 each, Rfl: 12 .  lisinopril-hydrochlorothiazide  (ZESTORETIC) 20-12.5 MG tablet, TAKE 2 TABLETS BY MOUTH  DAILY, Disp: 180 tablet, Rfl: 3 .  metFORMIN (GLUCOPHAGE) 1000 MG tablet, TAKE 1 TABLET BY MOUTH  TWICE DAILY WITH A MEAL, Disp: 180 tablet, Rfl: 3 .  montelukast (SINGULAIR) 10 MG tablet, Take 1 tablet (10 mg total) by mouth daily., Disp: 30 tablet, Rfl: 11 .  sitaGLIPtin (JANUVIA) 100 MG tablet, Take 1 tablet (100 mg total) by mouth daily., Disp: 90 tablet, Rfl: 1 .  spironolactone (ALDACTONE) 25 MG tablet, TAKE 1 TABLET BY MOUTH  DAILY, Disp: 90 tablet, Rfl: 3 .  verapamil (CALAN) 80 MG tablet, Take 1 tablet (80 mg total) by mouth 2 (two) times daily., Disp: 180 tablet, Rfl: 1  Review of Systems  Constitutional: Negative for appetite change, chills, fatigue and fever.  Respiratory: Negative for chest tightness and shortness of breath.   Cardiovascular: Negative for chest pain and palpitations.  Gastrointestinal: Negative for abdominal pain, nausea and vomiting.  Skin: Positive for wound.  Neurological: Negative for dizziness and weakness.    Social History   Tobacco Use  . Smoking status: Former Smoker    Packs/day: 0.25    Years: 21.00    Pack years: 5.25    Quit date: 06/08/1997    Years since quitting: 21.9  . Smokeless tobacco: Never Used  Substance Use Topics  . Alcohol use: No  Objective:   BP 119/80 (BP Location: Left Arm, Patient Position: Sitting, Cuff Size: Large)   Pulse 64   Temp 97.6 F (36.4 C) (Temporal)   Resp 16   Ht 5' (1.524 m)   Wt 223 lb (101.2 kg)   BMI 43.55 kg/m  Vitals:   05/26/19 1038  BP: 119/80  Pulse: 64  Resp: 16  Temp: 97.6 F (36.4 C)  TempSrc: Temporal  Weight: 223 lb (101.2 kg)  Height: 5' (1.524 m)  Body mass index is 43.55 kg/m.   Physical Exam Vitals reviewed.  Constitutional:      General: She is not in acute distress.    Appearance: Normal appearance. She is well-developed. She is obese. She is not ill-appearing.  HENT:     Head: Normocephalic and  atraumatic.  Pulmonary:     Effort: Pulmonary effort is normal. No respiratory distress.  Musculoskeletal:     Cervical back: Normal range of motion and neck supple.  Skin:    Findings: Abscess present.       Neurological:     Mental Status: She is alert.  Psychiatric:        Mood and Affect: Mood normal.        Behavior: Behavior normal.        Thought Content: Thought content normal.        Judgment: Judgment normal.      No results found for any visits on 05/26/19.     Assessment & Plan    1. Boil Suspect boil that has auto-drained and self resolving. Will give a few days of keflex as below to hopefully completely clear and prevent recurrence. May use warm compresses to area as well. Call if worsening again. - cephALEXin (KEFLEX) 500 MG capsule; Take 1 capsule (500 mg total) by mouth 2 (two) times daily.  Dispense: 14 capsule; Refill: 0     Mar Daring, PA-C  [facility]

## 2019-05-26 ENCOUNTER — Ambulatory Visit (INDEPENDENT_AMBULATORY_CARE_PROVIDER_SITE_OTHER): Payer: Medicare Other | Admitting: Physician Assistant

## 2019-05-26 ENCOUNTER — Encounter: Payer: Self-pay | Admitting: Physician Assistant

## 2019-05-26 ENCOUNTER — Other Ambulatory Visit: Payer: Self-pay

## 2019-05-26 VITALS — BP 119/80 | HR 64 | Temp 97.6°F | Resp 16 | Ht 60.0 in | Wt 223.0 lb

## 2019-05-26 DIAGNOSIS — L0292 Furuncle, unspecified: Secondary | ICD-10-CM | POA: Diagnosis not present

## 2019-05-26 MED ORDER — CEPHALEXIN 500 MG PO CAPS: 500.0000 mg | ORAL_CAPSULE | Freq: Two times a day (BID) | ORAL | 0 refills | Status: DC

## 2019-05-26 NOTE — Patient Instructions (Signed)
Skin Abscess  A skin abscess is an infected area on or under your skin that contains a collection of pus and other material. An abscess may also be called a furuncle, carbuncle, or boil. An abscess can occur in or on almost any part of your body. Some abscesses break open (rupture) on their own. Most continue to get worse unless they are treated. The infection can spread deeper into the body and eventually into your blood, which can make you feel ill. Treatment usually involves draining the abscess. What are the causes? An abscess occurs when germs, like bacteria, pass through your skin and cause an infection. This may be caused by:  A scrape or cut on your skin.  A puncture wound through your skin, including a needle injection or insect bite.  Blocked oil or sweat glands.  Blocked and infected hair follicles.  A cyst that forms beneath your skin (sebaceous cyst) and becomes infected. What increases the risk? This condition is more likely to develop in people who:  Have a weak body defense system (immune system).  Have diabetes.  Have dry and irritated skin.  Get frequent injections or use illegal IV drugs.  Have a foreign body in a wound, such as a splinter.  Have problems with their lymph system or veins. What are the signs or symptoms? Symptoms of this condition include:  A painful, firm bump under the skin.  A bump with pus at the top. This may break through the skin and drain. Other symptoms include:  Redness surrounding the abscess site.  Warmth.  Swelling of the lymph nodes (glands) near the abscess.  Tenderness.  A sore on the skin. How is this diagnosed? This condition may be diagnosed based on:  A physical exam.  Your medical history.  A sample of pus. This may be used to find out what is causing the infection.  Blood tests.  Imaging tests, such as an ultrasound, CT scan, or MRI. How is this treated? A small abscess that drains on its own may not  need treatment. Treatment for larger abscesses may include:  Moist heat or heat pack applied to the area several times a day.  A procedure to drain the abscess (incision and drainage).  Antibiotic medicines. For a severe abscess, you may first get antibiotics through an IV and then change to antibiotics by mouth. Follow these instructions at home: Medicines   Take over-the-counter and prescription medicines only as told by your health care provider.  If you were prescribed an antibiotic medicine, take it as told by your health care provider. Do not stop taking the antibiotic even if you start to feel better. Abscess care   If you have an abscess that has not drained, apply heat to the affected area. Use the heat source that your health care provider recommends, such as a moist heat pack or a heating pad. ? Place a towel between your skin and the heat source. ? Leave the heat on for 20-30 minutes. ? Remove the heat if your skin turns bright red. This is especially important if you are unable to feel pain, heat, or cold. You may have a greater risk of getting burned.  Follow instructions from your health care provider about how to take care of your abscess. Make sure you: ? Cover the abscess with a bandage (dressing). ? Change your dressing or gauze as told by your health care provider. ? Wash your hands with soap and water before you change the  dressing or gauze. If soap and water are not available, use hand sanitizer.  Check your abscess every day for signs of a worsening infection. Check for: ? More redness, swelling, or pain. ? More fluid or blood. ? Warmth. ? More pus or a bad smell. General instructions  To avoid spreading the infection: ? Do not share personal care items, towels, or hot tubs with others. ? Avoid making skin contact with other people.  Keep all follow-up visits as told by your health care provider. This is important. Contact a health care provider if you  have:  More redness, swelling, or pain around your abscess.  More fluid or blood coming from your abscess.  Warm skin around your abscess.  More pus or a bad smell coming from your abscess.  A fever.  Muscle aches.  Chills or a general ill feeling. Get help right away if you:  Have severe pain.  See red streaks on your skin spreading away from the abscess. Summary  A skin abscess is an infected area on or under your skin that contains a collection of pus and other material.  A small abscess that drains on its own may not need treatment.  Treatment for larger abscesses may include having a procedure to drain the abscess and taking an antibiotic. This information is not intended to replace advice given to you by your health care provider. Make sure you discuss any questions you have with your health care provider. Document Released: 03/05/2005 Document Revised: 09/16/2018 Document Reviewed: 07/09/2017 Elsevier Patient Education  2020 Elsevier Inc.  

## 2019-06-20 ENCOUNTER — Encounter: Payer: Self-pay | Admitting: Physician Assistant

## 2019-06-20 ENCOUNTER — Ambulatory Visit (INDEPENDENT_AMBULATORY_CARE_PROVIDER_SITE_OTHER): Payer: Medicare Other | Admitting: Physician Assistant

## 2019-06-20 VITALS — Temp 98.0°F

## 2019-06-20 DIAGNOSIS — J0140 Acute pansinusitis, unspecified: Secondary | ICD-10-CM

## 2019-06-20 MED ORDER — DOXYCYCLINE HYCLATE 100 MG PO TABS: 100.0000 mg | ORAL_TABLET | Freq: Two times a day (BID) | ORAL | 0 refills | Status: DC

## 2019-06-20 NOTE — Patient Instructions (Signed)
Sinusitis, Adult Sinusitis is inflammation of your sinuses. Sinuses are hollow spaces in the bones around your face. Your sinuses are located:  Around your eyes.  In the middle of your forehead.  Behind your nose.  In your cheekbones. Mucus normally drains out of your sinuses. When your nasal tissues become inflamed or swollen, mucus can become trapped or blocked. This allows bacteria, viruses, and fungi to grow, which leads to infection. Most infections of the sinuses are caused by a virus. Sinusitis can develop quickly. It can last for up to 4 weeks (acute) or for more than 12 weeks (chronic). Sinusitis often develops after a cold. What are the causes? This condition is caused by anything that creates swelling in the sinuses or stops mucus from draining. This includes:  Allergies.  Asthma.  Infection from bacteria or viruses.  Deformities or blockages in your nose or sinuses.  Abnormal growths in the nose (nasal polyps).  Pollutants, such as chemicals or irritants in the air.  Infection from fungi (rare). What increases the risk? You are more likely to develop this condition if you:  Have a weak body defense system (immune system).  Do a lot of swimming or diving.  Overuse nasal sprays.  Smoke. What are the signs or symptoms? The main symptoms of this condition are pain and a feeling of pressure around the affected sinuses. Other symptoms include:  Stuffy nose or congestion.  Thick drainage from your nose.  Swelling and warmth over the affected sinuses.  Headache.  Upper toothache.  A cough that may get worse at night.  Extra mucus that collects in the throat or the back of the nose (postnasal drip).  Decreased sense of smell and taste.  Fatigue.  A fever.  Sore throat.  Bad breath. How is this diagnosed? This condition is diagnosed based on:  Your symptoms.  Your medical history.  A physical exam.  Tests to find out if your condition is  acute or chronic. This may include: ? Checking your nose for nasal polyps. ? Viewing your sinuses using a device that has a light (endoscope). ? Testing for allergies or bacteria. ? Imaging tests, such as an MRI or CT scan. In rare cases, a bone biopsy may be done to rule out more serious types of fungal sinus disease. How is this treated? Treatment for sinusitis depends on the cause and whether your condition is chronic or acute.  If caused by a virus, your symptoms should go away on their own within 10 days. You may be given medicines to relieve symptoms. They include: ? Medicines that shrink swollen nasal passages (topical intranasal decongestants). ? Medicines that treat allergies (antihistamines). ? A spray that eases inflammation of the nostrils (topical intranasal corticosteroids). ? Rinses that help get rid of thick mucus in your nose (nasal saline washes).  If caused by bacteria, your health care provider may recommend waiting to see if your symptoms improve. Most bacterial infections will get better without antibiotic medicine. You may be given antibiotics if you have: ? A severe infection. ? A weak immune system.  If caused by narrow nasal passages or nasal polyps, you may need to have surgery. Follow these instructions at home: Medicines  Take, use, or apply over-the-counter and prescription medicines only as told by your health care provider. These may include nasal sprays.  If you were prescribed an antibiotic medicine, take it as told by your health care provider. Do not stop taking the antibiotic even if you start  to feel better. Hydrate and humidify   Drink enough fluid to keep your urine pale yellow. Staying hydrated will help to thin your mucus.  Use a cool mist humidifier to keep the humidity level in your home above 50%.  Inhale steam for 10-15 minutes, 3-4 times a day, or as told by your health care provider. You can do this in the bathroom while a hot shower is  running.  Limit your exposure to cool or dry air. Rest  Rest as much as possible.  Sleep with your head raised (elevated).  Make sure you get enough sleep each night. General instructions   Apply a warm, moist washcloth to your face 3-4 times a day or as told by your health care provider. This will help with discomfort.  Wash your hands often with soap and water to reduce your exposure to germs. If soap and water are not available, use hand sanitizer.  Do not smoke. Avoid being around people who are smoking (secondhand smoke).  Keep all follow-up visits as told by your health care provider. This is important. Contact a health care provider if:  You have a fever.  Your symptoms get worse.  Your symptoms do not improve within 10 days. Get help right away if:  You have a severe headache.  You have persistent vomiting.  You have severe pain or swelling around your face or eyes.  You have vision problems.  You develop confusion.  Your neck is stiff.  You have trouble breathing. Summary  Sinusitis is soreness and inflammation of your sinuses. Sinuses are hollow spaces in the bones around your face.  This condition is caused by nasal tissues that become inflamed or swollen. The swelling traps or blocks the flow of mucus. This allows bacteria, viruses, and fungi to grow, which leads to infection.  If you were prescribed an antibiotic medicine, take it as told by your health care provider. Do not stop taking the antibiotic even if you start to feel better.  Keep all follow-up visits as told by your health care provider. This is important. This information is not intended to replace advice given to you by your health care provider. Make sure you discuss any questions you have with your health care provider. Document Revised: 10/26/2017 Document Reviewed: 10/26/2017 Elsevier Patient Education  2020 Elsevier Inc.  

## 2019-06-20 NOTE — Progress Notes (Signed)
Patient: Alexis Lloyd Female    DOB: 02-14-1961   59 y.o.   MRN: [id number] Visit Date: 06/20/2019  Today's Provider: Mar Daring, PA-C   No chief complaint on file.  Subjective:     Virtual Visit via Telephone Note  I connected with Alexis Lloyd Patient on 06/20/19 at 11:00 AM EST by telephone and verified that I am speaking with the correct person using two identifiers.  Location: Patient: home Provider: [facility]   I discussed the limitations, risks, security and privacy concerns of performing an evaluation and management service by telephone and the availability of in person appointments. I also discussed with the patient that there may be a patient responsible charge related to this service. The patient expressed understanding and agreed to proceed.  Sinus Problem This is a new problem. The current episode started yesterday. The problem is unchanged. There has been no fever. Associated symptoms include congestion, headaches and sinus pressure. Pertinent negatives include no chills, coughing, ear pain, shortness of breath, sneezing or sore throat. (Itching ears/stuffy) Past treatments include oral decongestants.  Patient does have a tooth that is broke off and may be causing symptoms.    Allergies  Allergen Reactions  . Allegra [Fexofenadine] Nausea And Vomiting  . Avelox [Moxifloxacin] Other (See Comments)    Hallucinations  . Bactrim [Sulfamethoxazole-Trimethoprim] Other (See Comments)    unknown  . Etodolac Nausea Only  . Fish-Derived Products Nausea And Vomiting    With "seafood"  . Penicillins Diarrhea and Nausea And Vomiting  . Shellfish Allergy Nausea Only  . Sulfa Antibiotics Other (See Comments)    unknown  . Macrobid [employer] Macro] Other (See Comments)    Constipation and globus hystericus.     Current Outpatient Medications:  .  albuterol (PROVENTIL HFA) 108 (90 Base) MCG/ACT inhaler, Inhale 2 puffs every 6 (six) hours as needed into  the lungs., Disp: 3 Inhaler, Rfl: 1 .  Blood Glucose Monitoring Suppl (ONETOUCH VERIO) w/Device KIT, To check blood sugar once daily, Disp: 1 kit, Rfl: 0 .  cetirizine (ZYRTEC) 10 MG tablet, Take 1 tablet (10 mg total) by mouth daily., Disp: 90 tablet, Rfl: 1 .  glucose blood (ONETOUCH VERIO) test strip, To check blood sugar once daily, Disp: 100 each, Rfl: 12 .  Lancets (ONETOUCH ULTRASOFT) lancets, USE LANCET(S)  TO CHECK GLUCOSE DAILY, Disp: 100 each, Rfl: 12 .  lisinopril-hydrochlorothiazide (ZESTORETIC) 20-12.5 MG tablet, TAKE 2 TABLETS BY MOUTH  DAILY, Disp: 180 tablet, Rfl: 3 .  metFORMIN (GLUCOPHAGE) 1000 MG tablet, TAKE 1 TABLET BY MOUTH  TWICE DAILY WITH A MEAL, Disp: 180 tablet, Rfl: 3 .  montelukast (SINGULAIR) 10 MG tablet, Take 1 tablet (10 mg total) by mouth daily., Disp: 30 tablet, Rfl: 11 .  sitaGLIPtin (JANUVIA) 100 MG tablet, Take 1 tablet (100 mg total) by mouth daily., Disp: 90 tablet, Rfl: 1 .  spironolactone (ALDACTONE) 25 MG tablet, TAKE 1 TABLET BY MOUTH  DAILY, Disp: 90 tablet, Rfl: 3 .  verapamil (CALAN) 80 MG tablet, Take 1 tablet (80 mg total) by mouth 2 (two) times daily., Disp: 180 tablet, Rfl: 1 .  ketoconazole (NIZORAL) 2 % cream, APPLY CREAM TOPICALLY TWICE DAILY TO FEET, Disp: , Rfl: 2  Review of Systems  Constitutional: Positive for fatigue. Negative for chills and fever.  HENT: Positive for congestion, dental problem, rhinorrhea, sinus pressure and sinus pain. Negative for ear pain, postnasal drip, sneezing, sore throat and tinnitus.   Respiratory:  Negative for cough, chest tightness, shortness of breath and wheezing.   Cardiovascular: Negative for chest pain, palpitations and leg swelling.  Gastrointestinal: Negative for abdominal pain and nausea.  Neurological: Positive for headaches. Negative for dizziness.    Social History   Tobacco Use  . Smoking status: Former Smoker    Packs/day: 0.25    Years: 21.00    Pack years: 5.25    Quit date:  06/08/1997    Years since quitting: 22.0  . Smokeless tobacco: Never Used  Substance Use Topics  . Alcohol use: No      Objective:   Temp 98 F (36.7 C) (Temporal)  Vitals:   06/20/19 1108  Temp: 98 F (36.7 C)  TempSrc: Temporal  There is no height or weight on file to calculate BMI.   Physical Exam Vitals reviewed.  Pulmonary:     Effort: No respiratory distress.  Neurological:     Mental Status: She is alert.      No results found for any visits on 06/20/19.     Assessment & Plan     1. Acute non-recurrent pansinusitis Worsening symptoms that have not responded to OTC medications. Discussed covid-19 testing. She declines at this time and will isolate for 7-10 days, advised to be asymptomatic x 24 hrs before returning to work. Will give Doxycycline as below. Continue allergy medications. Stay well hydrated and get plenty of rest. Call if no symptom improvement or if symptoms worsen. - doxycycline (VIBRA-TABS) 100 MG tablet; Take 1 tablet (100 mg total) by mouth 2 (two) times daily.  Dispense: 20 tablet; Refill: 0   I discussed the assessment and treatment plan with the patient. The patient was provided an opportunity to ask questions and all were answered. The patient agreed with the plan and demonstrated an understanding of the instructions.   The patient was advised to call back or seek an in-person evaluation if the symptoms worsen or if the condition fails to improve as anticipated.  I provided 12 minutes of non-face-to-face time during this encounter.    Mar Daring, PA-C  [facility]

## 2019-07-07 ENCOUNTER — Encounter: Payer: Self-pay | Admitting: Physician Assistant

## 2019-07-07 ENCOUNTER — Other Ambulatory Visit: Payer: Self-pay

## 2019-07-07 ENCOUNTER — Ambulatory Visit (INDEPENDENT_AMBULATORY_CARE_PROVIDER_SITE_OTHER): Payer: Medicare Other | Admitting: Physician Assistant

## 2019-07-07 ENCOUNTER — Ambulatory Visit
Admission: RE | Admit: 2019-07-07 | Discharge: 2019-07-07 | Disposition: A | Discharge status: Home / Self Care | Payer: Medicare Other | Source: Ambulatory Visit | Attending: Physician Assistant | Admitting: Physician Assistant

## 2019-07-07 ENCOUNTER — Telehealth: Payer: Self-pay

## 2019-07-07 VITALS — BP 122/70 | HR 89 | Temp 97.7°F | Resp 20 | Wt 217.0 lb

## 2019-07-07 DIAGNOSIS — IMO0002 Reserved for concepts with insufficient information to code with codable children: Secondary | ICD-10-CM

## 2019-07-07 DIAGNOSIS — Z8249 Family history of ischemic heart disease and other diseases of the circulatory system: Secondary | ICD-10-CM | POA: Diagnosis not present

## 2019-07-07 DIAGNOSIS — H5711 Ocular pain, right eye: Secondary | ICD-10-CM | POA: Diagnosis not present

## 2019-07-07 DIAGNOSIS — R202 Paresthesia of skin: Secondary | ICD-10-CM | POA: Diagnosis not present

## 2019-07-07 DIAGNOSIS — E1165 Type 2 diabetes mellitus with hyperglycemia: Secondary | ICD-10-CM | POA: Insufficient documentation

## 2019-07-07 DIAGNOSIS — R519 Headache, unspecified: Secondary | ICD-10-CM | POA: Diagnosis not present

## 2019-07-07 DIAGNOSIS — G4485 Primary stabbing headache: Secondary | ICD-10-CM | POA: Diagnosis not present

## 2019-07-07 DIAGNOSIS — G43101 Migraine with aura, not intractable, with status migrainosus: Secondary | ICD-10-CM

## 2019-07-07 DIAGNOSIS — E1140 Type 2 diabetes mellitus with diabetic neuropathy, unspecified: Secondary | ICD-10-CM

## 2019-07-07 DIAGNOSIS — R200 Anesthesia of skin: Secondary | ICD-10-CM | POA: Diagnosis not present

## 2019-07-07 MED ORDER — PROMETHAZINE HCL 25 MG PO TABS: 25.0000 mg | ORAL_TABLET | Freq: Four times a day (QID) | ORAL | 0 refills | Status: DC | PRN

## 2019-07-07 MED ORDER — SUMATRIPTAN SUCCINATE 50 MG PO TABS: 50.0000 mg | ORAL_TABLET | ORAL | 0 refills | Status: DC | PRN

## 2019-07-07 NOTE — Telephone Encounter (Signed)
Patient advised.

## 2019-07-07 NOTE — Patient Instructions (Signed)

## 2019-07-07 NOTE — Telephone Encounter (Signed)
-----   Message from Mar Daring, [place] sent at 07/07/2019 12:56 PM EST ----- CT scan is normal. Suspect migraine. Will send in medications to pharmacy.

## 2019-07-07 NOTE — Addendum Note (Signed)
Addended by: Mar Daring on: 07/07/2019 12:58 PM   Modules accepted: Orders

## 2019-07-07 NOTE — Progress Notes (Signed)
Patient: Alexis Lloyd Female    DOB: 10/07/1960   59 y.o.   MRN: [id number] Visit Date: 07/07/2019  Today's Provider: Mar Daring, PA-C   Chief Complaint  Patient presents with  . Eye Pain   Subjective:     Eye Pain  The right eye is affected. This is a new problem. The current episode started yesterday. The problem occurs constantly. The problem has been gradually worsening. There was no injury mechanism. The pain is severe. There is no known exposure to pink eye. She does not wear contacts. Associated symptoms include nausea and vomiting. Pertinent negatives include no fever or weakness. Associated symptoms comments: Facial pain on right side.   Patient reports she noticed the left hand numbness on Sunday. No weakness or other symptoms. Then on Tuesday the right side facial pain and eye pain started. She reports the pain is around the right eye socket, not the eyeball itself. Then radiates over the right eyebrow, right cheek, to the right ear. Tenderness to palpation over the temporal area. No visual changes (has macular degeneration) but no changes from baseline. Now having nausea and vomiting. No appetite. Does have history of migraines and does report it is kind of similar, but has not had a migraine in over 10 years. Also does have family history of brain aneurysm in brother (COD in 2006).   She is obese and has comorbidities of T2DM, HTN, OSA, and h/o breast cancer.   Allergies  Allergen Reactions  . Allegra [Fexofenadine] Nausea And Vomiting  . Avelox [Moxifloxacin] Other (See Comments)    Hallucinations  . Bactrim [Sulfamethoxazole-Trimethoprim] Other (See Comments)    unknown  . Etodolac Nausea Only  . Fish-Derived Products Nausea And Vomiting    With "seafood"  . Penicillins Diarrhea and Nausea And Vomiting  . Shellfish Allergy Nausea Only  . Sulfa Antibiotics Other (See Comments)    unknown  . Macrobid [employer] Macro] Other (See  Comments)    Constipation and globus hystericus.     Current Outpatient Medications:  .  albuterol (PROVENTIL HFA) 108 (90 Base) MCG/ACT inhaler, Inhale 2 puffs every 6 (six) hours as needed into the lungs., Disp: 3 Inhaler, Rfl: 1 .  Blood Glucose Monitoring Suppl (ONETOUCH VERIO) w/Device KIT, To check blood sugar once daily, Disp: 1 kit, Rfl: 0 .  cetirizine (ZYRTEC) 10 MG tablet, Take 1 tablet (10 mg total) by mouth daily., Disp: 90 tablet, Rfl: 1 .  doxycycline (VIBRA-TABS) 100 MG tablet, Take 1 tablet (100 mg total) by mouth 2 (two) times daily., Disp: 20 tablet, Rfl: 0 .  glucose blood (ONETOUCH VERIO) test strip, To check blood sugar once daily, Disp: 100 each, Rfl: 12 .  ketoconazole (NIZORAL) 2 % cream, APPLY CREAM TOPICALLY TWICE DAILY TO FEET, Disp: , Rfl: 2 .  Lancets (ONETOUCH ULTRASOFT) lancets, USE LANCET(S)  TO CHECK GLUCOSE DAILY, Disp: 100 each, Rfl: 12 .  lisinopril-hydrochlorothiazide (ZESTORETIC) 20-12.5 MG tablet, TAKE 2 TABLETS BY MOUTH  DAILY, Disp: 180 tablet, Rfl: 3 .  metFORMIN (GLUCOPHAGE) 1000 MG tablet, TAKE 1 TABLET BY MOUTH  TWICE DAILY WITH A MEAL, Disp: 180 tablet, Rfl: 3 .  montelukast (SINGULAIR) 10 MG tablet, Take 1 tablet (10 mg total) by mouth daily., Disp: 30 tablet, Rfl: 11 .  sitaGLIPtin (JANUVIA) 100 MG tablet, Take 1 tablet (100 mg total) by mouth daily., Disp: 90 tablet, Rfl: 1 .  spironolactone (ALDACTONE) 25 MG tablet, TAKE 1 TABLET BY  MOUTH  DAILY, Disp: 90 tablet, Rfl: 3 .  verapamil (CALAN) 80 MG tablet, Take 1 tablet (80 mg total) by mouth 2 (two) times daily., Disp: 180 tablet, Rfl: 1  Review of Systems  Constitutional: Negative for appetite change, chills, fatigue and fever.  Eyes: Positive for pain.  Respiratory: Negative for chest tightness and shortness of breath.   Cardiovascular: Negative for chest pain, palpitations and leg swelling.  Gastrointestinal: Positive for nausea and vomiting. Negative for abdominal pain.  Neurological:  Positive for numbness and headaches. Negative for dizziness, syncope and weakness.    Social History   Tobacco Use  . Smoking status: Former Smoker    Packs/day: 0.25    Years: 21.00    Pack years: 5.25    Quit date: 06/08/1997    Years since quitting: 22.0  . Smokeless tobacco: Never Used  Substance Use Topics  . Alcohol use: No      Objective:   BP 122/70 (BP Location: Right Arm, Patient Position: Sitting, Cuff Size: Large)   Pulse 89   Temp 97.7 F (36.5 C) (Temporal)   Resp 20   Wt 217 lb (98.4 kg)   SpO2 97% Comment: room air  BMI 42.38 kg/m  Vitals:   07/07/19 1006  BP: 122/70  Pulse: 89  Resp: 20  Temp: 97.7 F (36.5 C)  TempSrc: Temporal  SpO2: 97%  Weight: 217 lb (98.4 kg)  Body mass index is 42.38 kg/m.   Physical Exam Vitals reviewed.  Constitutional:      General: She is not in acute distress.    Appearance: She is well-developed. She is obese. She is ill-appearing. She is not diaphoretic.  Neck:     Thyroid: No thyromegaly.     Vascular: No carotid bruit or JVD.     Trachea: No tracheal deviation.  Cardiovascular:     Rate and Rhythm: Normal rate and regular rhythm.     Pulses: Normal pulses.     Heart sounds: Normal heart sounds. No murmur. No friction rub. No gallop.   Pulmonary:     Effort: Pulmonary effort is normal. No respiratory distress.     Breath sounds: Normal breath sounds. No wheezing or rales.  Musculoskeletal:     Cervical back: Normal range of motion and neck supple.  Lymphadenopathy:     Cervical: No cervical adenopathy.  Neurological:     General: No focal deficit present.     Mental Status: She is alert and oriented to person, place, and time. Mental status is at baseline.     Cranial Nerves: No cranial nerve deficit.     Motor: No weakness.     Coordination: Coordination normal.     Gait: Gait normal.  Psychiatric:        Mood and Affect: Mood normal.        Behavior: Behavior normal.        Thought Content:  Thought content normal.        Judgment: Judgment normal.      No results found for any visits on 07/07/19.     Assessment & Plan    1. Primary stabbing headache Worsening headache over last 2 days with nausea and vomiting. Suspect migraine, but will get CT to r/o CVA, aneurysm, mass. If CT negative will treat for migraine. She is to call or go to the ER if worsening.  - CBC w/Diff/Platelet - Basic Metabolic Panel (BMET) - HgB A1c - Sed Rate (ESR) - CT Head Wo  Contrast; Future  2. Acute right eye pain See above medical treatment plan. - CBC w/Diff/Platelet - Basic Metabolic Panel (BMET) - HgB A1c - Sed Rate (ESR) - CT Head Wo Contrast; Future  3. Numbness and tingling in left hand See above medical treatment plan. - CBC w/Diff/Platelet - Basic Metabolic Panel (BMET) - HgB A1c - Sed Rate (ESR) - CT Head Wo Contrast; Future  4. DM type 2, uncontrolled, with neuropathy ([place]) Will check labs as below and f/u pending results. - Basic Metabolic Panel (BMET) - HgB A1c - CT Head Wo Contrast; Future  5. Family history of brain aneurysm See above medical treatment plan. - CT Head Wo Contrast; Future     Mar Daring, PA-C  [facility]

## 2019-07-08 ENCOUNTER — Telehealth: Payer: Self-pay

## 2019-07-08 ENCOUNTER — Ambulatory Visit: Payer: Medicare Other | Attending: Physician Assistant

## 2019-07-08 DIAGNOSIS — Z20822 Contact with and (suspected) exposure to covid-19: Secondary | ICD-10-CM

## 2019-07-08 LAB — CBC WITH DIFFERENTIAL/PLATELET
Basophils Absolute: 0.1 10*3/uL (ref 0.0–0.2)
Basos: 1 %
EOS (ABSOLUTE): 0 10*3/uL (ref 0.0–0.4)
Eos: 0 %
Hematocrit: 34.9 % (ref 34.0–46.6)
Hemoglobin: 11.9 g/dL (ref 11.1–15.9)
Immature Grans (Abs): 0 10*3/uL (ref 0.0–0.1)
Immature Granulocytes: 0 %
Lymphocytes Absolute: 3.2 10*3/uL — ABNORMAL HIGH (ref 0.7–3.1)
Lymphs: 34 %
MCH: 29 pg (ref 26.6–33.0)
MCHC: 34.1 g/dL (ref 31.5–35.7)
MCV: 85 fL (ref 79–97)
Monocytes Absolute: 0.5 10*3/uL (ref 0.1–0.9)
Monocytes: 6 %
Neutrophils Absolute: 5.6 10*3/uL (ref 1.4–7.0)
Neutrophils: 59 %
Platelets: 324 10*3/uL (ref 150–450)
RBC: 4.1 x10E6/uL (ref 3.77–5.28)
RDW: 13 % (ref 11.7–15.4)
WBC: 9.4 10*3/uL (ref 3.4–10.8)

## 2019-07-08 LAB — BASIC METABOLIC PANEL
BUN/Creatinine Ratio: 17 (ref 12–28)
BUN: 14 mg/dL (ref 8–27)
CO2: 23 mmol/L (ref 20–29)
Calcium: 10.1 mg/dL (ref 8.7–10.3)
Chloride: 95 mmol/L — ABNORMAL LOW (ref 96–106)
Creatinine, Ser: 0.81 mg/dL (ref 0.57–1.00)
GFR calc Af Amer: 91 mL/min/{1.73_m2} (ref >59)
GFR calc non Af Amer: 79 mL/min/{1.73_m2} (ref >59)
Glucose: 178 mg/dL — ABNORMAL HIGH (ref 65–99)
Potassium: 4 mmol/L (ref 3.5–5.2)
Sodium: 136 mmol/L (ref 134–144)

## 2019-07-08 LAB — SEDIMENTATION RATE: Sed Rate: 41 mm/hr — ABNORMAL HIGH (ref 0–40)

## 2019-07-08 LAB — HEMOGLOBIN A1C
Est. average glucose Bld gHb Est-mCnc: 203 mg/dL
Hgb A1c MFr Bld: 8.7 % — ABNORMAL HIGH (ref 4.8–5.6)

## 2019-07-08 NOTE — Telephone Encounter (Signed)
-----   Message from Mar Daring, [place] sent at 07/08/2019  7:16 AM EST ----- Blood count is normal. Kidney function is normal. Sodium, potassium and calcium are normal. Sugar is up compared to last check. A1c went from 8.0 to 8.7. Would recommend to add glipizide to help lower blood sugar and continuing metformin and Januvia. Sed rate is only borderline high. This would be classified still in a normal range. Is your headache improving?

## 2019-07-08 NOTE — Telephone Encounter (Signed)
Patient advised as directed below. Per patient her headache is not better and now she feels like not eating and developed diarrhea yesterday. I advised patient to get tested for COVID and gave information.

## 2019-07-09 DIAGNOSIS — Z882 Allergy status to sulfonamides status: Secondary | ICD-10-CM | POA: Diagnosis not present

## 2019-07-09 DIAGNOSIS — K029 Dental caries, unspecified: Secondary | ICD-10-CM | POA: Diagnosis not present

## 2019-07-09 DIAGNOSIS — K047 Periapical abscess without sinus: Secondary | ICD-10-CM | POA: Diagnosis not present

## 2019-07-09 DIAGNOSIS — N189 Chronic kidney disease, unspecified: Secondary | ICD-10-CM | POA: Diagnosis not present

## 2019-07-09 DIAGNOSIS — Z880 Allergy status to penicillin: Secondary | ICD-10-CM | POA: Diagnosis not present

## 2019-07-09 DIAGNOSIS — R519 Headache, unspecified: Secondary | ICD-10-CM | POA: Diagnosis not present

## 2019-07-09 DIAGNOSIS — M1990 Unspecified osteoarthritis, unspecified site: Secondary | ICD-10-CM | POA: Diagnosis not present

## 2019-07-09 DIAGNOSIS — I129 Hypertensive chronic kidney disease with stage 1 through stage 4 chronic kidney disease, or unspecified chronic kidney disease: Secondary | ICD-10-CM | POA: Diagnosis not present

## 2019-07-09 DIAGNOSIS — R0602 Shortness of breath: Secondary | ICD-10-CM | POA: Diagnosis not present

## 2019-07-09 DIAGNOSIS — J45909 Unspecified asthma, uncomplicated: Secondary | ICD-10-CM | POA: Diagnosis not present

## 2019-07-09 DIAGNOSIS — Z7984 Long term (current) use of oral hypoglycemic drugs: Secondary | ICD-10-CM | POA: Diagnosis not present

## 2019-07-09 DIAGNOSIS — R439 Unspecified disturbances of smell and taste: Secondary | ICD-10-CM | POA: Diagnosis not present

## 2019-07-09 DIAGNOSIS — S025XXA Fracture of tooth (traumatic), initial encounter for closed fracture: Secondary | ICD-10-CM | POA: Diagnosis not present

## 2019-07-09 DIAGNOSIS — Z79899 Other long term (current) drug therapy: Secondary | ICD-10-CM | POA: Diagnosis not present

## 2019-07-09 DIAGNOSIS — E1122 Type 2 diabetes mellitus with diabetic chronic kidney disease: Secondary | ICD-10-CM | POA: Diagnosis not present

## 2019-07-09 DIAGNOSIS — Z20822 Contact with and (suspected) exposure to covid-19: Secondary | ICD-10-CM | POA: Diagnosis not present

## 2019-07-11 ENCOUNTER — Telehealth: Payer: Self-pay

## 2019-07-11 LAB — A. PHAGOCYTOPHILUM PCR

## 2019-07-11 MED ORDER — KETOROLAC TROMETHAMINE 30 MG/ML IJ SOLN
30.00 | INTRAMUSCULAR | Status: DC
Start: ? — End: 2019-07-11

## 2019-07-11 NOTE — Telephone Encounter (Signed)
Copied from [place] [id number]. Topic: General - Inquiry >> Jul 11, 2019 10:20 AM Reyne Dumas L wrote: Reason for CRM:   Pt states that her employer wants to have a COVID vaccine on Friday.  Pt states that she has been sick and Fenton Malling knows about this.  Wants to know if PCP thinks it is a good idea for her to have vaccine at this time.

## 2019-07-11 NOTE — Telephone Encounter (Signed)
Patient advised as directed below. 

## 2019-07-11 NOTE — Telephone Encounter (Signed)
Called patient no answer/LM -ok for PEC to give message Per provider patient is on antibiotic she needs to wait 2 weeks after completing the antibiotic. Thanks. -Josie R.

## 2019-07-13 DIAGNOSIS — K121 Other forms of stomatitis: Secondary | ICD-10-CM | POA: Diagnosis not present

## 2019-07-13 DIAGNOSIS — D1030 Benign neoplasm of unspecified part of mouth: Secondary | ICD-10-CM | POA: Diagnosis not present

## 2019-07-13 DIAGNOSIS — K083 Retained dental root: Secondary | ICD-10-CM | POA: Diagnosis not present

## 2019-07-15 DIAGNOSIS — J0100 Acute maxillary sinusitis, unspecified: Secondary | ICD-10-CM | POA: Diagnosis not present

## 2019-07-15 DIAGNOSIS — I739 Peripheral vascular disease, unspecified: Secondary | ICD-10-CM | POA: Diagnosis not present

## 2019-07-31 ENCOUNTER — Ambulatory Visit: Payer: Medicare Other | Attending: Internal Medicine

## 2019-07-31 DIAGNOSIS — Z23 Encounter for immunization: Secondary | ICD-10-CM

## 2019-07-31 NOTE — Progress Notes (Signed)
   Covid-19 Vaccination Clinic  Name:  Alexis Lloyd    MRN: [id number] DOB: 1961-03-31  07/31/2019  [name] was observed post Covid-19 immunization for 15 minutes without incidence. She was provided with Vaccine Information Sheet and instruction to access the V-Safe system.   [name] was instructed to call 911 with any severe reactions post vaccine: Marland Kitchen Difficulty breathing  . Swelling of your face and throat  . A fast heartbeat  . A bad rash all over your body  . Dizziness and weakness    Immunizations Administered    Name Date Dose VIS Date Route   Pfizer COVID-19 Vaccine 07/31/2019  2:13 PM 0.3 mL 05/20/2019 Intramuscular   Manufacturer: [place]   Lot: [id number]   [place]: [id number]

## 2019-08-01 ENCOUNTER — Other Ambulatory Visit: Payer: Self-pay | Admitting: Physician Assistant

## 2019-08-01 DIAGNOSIS — I10 Essential (primary) hypertension: Secondary | ICD-10-CM

## 2019-08-02 LAB — SPECIMEN STATUS REPORT

## 2019-08-02 LAB — NOVEL CORONAVIRUS, NAA: SARS-CoV-2, NAA: NOT DETECTED

## 2019-08-12 ENCOUNTER — Ambulatory Visit (INDEPENDENT_AMBULATORY_CARE_PROVIDER_SITE_OTHER): Payer: Medicare Other | Admitting: Family Medicine

## 2019-08-12 ENCOUNTER — Other Ambulatory Visit: Payer: Self-pay

## 2019-08-12 ENCOUNTER — Ambulatory Visit: Payer: Medicare Other | Admitting: Physician Assistant

## 2019-08-12 VITALS — BP 148/71 | HR 60 | Temp 96.6°F | Resp 16 | Wt 223.0 lb

## 2019-08-12 DIAGNOSIS — N3091 Cystitis, unspecified with hematuria: Secondary | ICD-10-CM | POA: Diagnosis not present

## 2019-08-12 DIAGNOSIS — R300 Dysuria: Secondary | ICD-10-CM | POA: Diagnosis not present

## 2019-08-12 LAB — POCT URINALYSIS DIPSTICK
Bilirubin, UA: NEGATIVE
Blood, UA: POSITIVE
Glucose, UA: POSITIVE — AB
Ketones, UA: NEGATIVE
Nitrite, UA: NEGATIVE
Protein, UA: POSITIVE — AB
Spec Grav, UA: 1.02 (ref 1.010–1.025)
Urobilinogen, UA: 0.2 E.U./dL
pH, UA: 5 (ref 5.0–8.0)

## 2019-08-12 MED ORDER — CEPHALEXIN 500 MG PO CAPS: 500.0000 mg | ORAL_CAPSULE | Freq: Two times a day (BID) | ORAL | 0 refills | Status: AC

## 2019-08-12 NOTE — Progress Notes (Signed)
Patient: Alexis Lloyd Female    DOB: November 08, 1960   59 y.o.   MRN: [id number] Visit Date: 08/12/2019  Today's Provider: Lavon Paganini, MD   Chief Complaint  Patient presents with  . Urinary Tract Infection   Subjective:    I Sulibeya S. Dimas, CMA, am acting as scribe for Lavon Paganini, MD.  HPI Urinary Tract Infection: Patient complains of bilateral flank pain, incontinence and urgency, frequency with nocturia She has had symptoms for 5 days. Patient also complains of back pain. Patient denies fever, stomach ache and vaginal discharge. Patient does have a history of recurrent UTI.  Patient does not have a history of pyelonephritis.   Allergies  Allergen Reactions  . Allegra [Fexofenadine] Nausea And Vomiting  . Avelox [Moxifloxacin] Other (See Comments)    Hallucinations  . Bactrim [Sulfamethoxazole-Trimethoprim] Other (See Comments)    unknown  . Etodolac Nausea Only  . Fish-Derived Products Nausea And Vomiting    With "seafood"  . Penicillins Diarrhea and Nausea And Vomiting  . Shellfish Allergy Nausea Only  . Sulfa Antibiotics Other (See Comments)    unknown  . Macrobid [employer] Macro] Other (See Comments)    Constipation and globus hystericus.     Current Outpatient Medications:  .  albuterol (PROVENTIL HFA) 108 (90 Base) MCG/ACT inhaler, Inhale 2 puffs every 6 (six) hours as needed into the lungs., Disp: 3 Inhaler, Rfl: 1 .  Blood Glucose Monitoring Suppl (ONETOUCH VERIO) w/Device KIT, To check blood sugar once daily, Disp: 1 kit, Rfl: 0 .  cetirizine (ZYRTEC) 10 MG tablet, Take 1 tablet (10 mg total) by mouth daily., Disp: 90 tablet, Rfl: 1 .  glucose blood (ONETOUCH VERIO) test strip, To check blood sugar once daily, Disp: 100 each, Rfl: 12 .  Lancets (ONETOUCH ULTRASOFT) lancets, USE LANCET(S)  TO CHECK GLUCOSE DAILY, Disp: 100 each, Rfl: 12 .  lisinopril-hydrochlorothiazide (ZESTORETIC) 20-12.5 MG tablet, TAKE 2 TABLETS BY MOUTH   DAILY, Disp: 180 tablet, Rfl: 3 .  metFORMIN (GLUCOPHAGE) 1000 MG tablet, TAKE 1 TABLET BY MOUTH  TWICE DAILY WITH A MEAL, Disp: 180 tablet, Rfl: 3 .  montelukast (SINGULAIR) 10 MG tablet, Take 1 tablet (10 mg total) by mouth daily., Disp: 30 tablet, Rfl: 11 .  Probiotic Product (PROBIOTIC ADVANCED PO), Take by mouth., Disp: , Rfl:  .  sitaGLIPtin (JANUVIA) 100 MG tablet, Take 1 tablet (100 mg total) by mouth daily., Disp: 90 tablet, Rfl: 1 .  spironolactone (ALDACTONE) 25 MG tablet, TAKE 1 TABLET BY MOUTH  DAILY, Disp: 90 tablet, Rfl: 3 .  verapamil (CALAN) 80 MG tablet, TAKE 1 TABLET BY MOUTH  TWICE DAILY, Disp: 180 tablet, Rfl: 3 .  ketoconazole (NIZORAL) 2 % cream, APPLY CREAM TOPICALLY TWICE DAILY TO FEET, Disp: , Rfl: 2 .  promethazine (PHENERGAN) 25 MG tablet, Take 1 tablet (25 mg total) by mouth every 6 (six) hours as needed for nausea or vomiting. (Patient not taking: Reported on 08/12/2019), Disp: 30 tablet, Rfl: 0 .  SUMAtriptan (IMITREX) 50 MG tablet, Take 1 tablet (50 mg total) by mouth every 2 (two) hours as needed for migraine. No more than 286m in 24 hours (Patient not taking: Reported on 08/12/2019), Disp: 10 tablet, Rfl: 0  Review of Systems  Constitutional: Negative for chills and fatigue.  Respiratory: Negative for cough and shortness of breath.   Cardiovascular: Negative for chest pain and palpitations.  Genitourinary: Positive for flank pain and urgency.  Musculoskeletal: Positive for  back pain.    Social History   Tobacco Use  . Smoking status: Former Smoker    Packs/day: 0.25    Years: 21.00    Pack years: 5.25    Quit date: 06/08/1997    Years since quitting: 22.1  . Smokeless tobacco: Never Used  Substance Use Topics  . Alcohol use: No      Objective:   BP (!) 148/71 (BP Location: Right Arm, Patient Position: Sitting, Cuff Size: Large)   Pulse 60   Temp (!) 96.6 F (35.9 C) (Temporal)   Resp 16   Wt 223 lb (101.2 kg)   BMI 43.55 kg/m  Vitals:    08/12/19 0933  BP: (!) 148/71  Pulse: 60  Resp: 16  Temp: (!) 96.6 F (35.9 C)  TempSrc: Temporal  Weight: 223 lb (101.2 kg)  Body mass index is 43.55 kg/m.   Physical Exam Vitals reviewed.  Constitutional:      General: She is not in acute distress.    Appearance: She is well-developed.  HENT:     Head: Normocephalic and atraumatic.  Eyes:     General: No scleral icterus.    Conjunctiva/sclera: Conjunctivae normal.  Cardiovascular:     Rate and Rhythm: Normal rate and regular rhythm.     Heart sounds: Normal heart sounds. No murmur.  Pulmonary:     Effort: Pulmonary effort is normal. No respiratory distress.     Breath sounds: Normal breath sounds. No wheezing or rales.  Abdominal:     General: There is no distension.     Palpations: Abdomen is soft.     Tenderness: There is no abdominal tenderness. There is no right CVA tenderness, left CVA tenderness, guarding or rebound.  Skin:    General: Skin is warm and dry.     Capillary Refill: Capillary refill takes less than 2 seconds.     Findings: No rash.  Neurological:     Mental Status: She is alert and oriented to person, place, and time. Mental status is at baseline.  Psychiatric:        Behavior: Behavior normal.      No results found for any visits on 08/12/19.     Assessment & Plan    1. Cystitis with hematuria - Symptoms and UA consistent with UTI -No systemic symptoms or signs of pyelonephritis - Given hematuria, will send urine micro to confirm and will plan to recheck urine in about 6 weeks after completion of antibiotics to ensure hematuria has cleared -Will start treatment with 7day course of Keflex after reviewing previous urine culture  -We will send urine culture to confirm sensitivities -Discussed return precautions  - Urine Culture - Urine Microscopic   Meds ordered this encounter  Medications  . cephALEXin (KEFLEX) 500 MG capsule    Sig: Take 1 capsule (500 mg total) by mouth 2 (two)  times daily for 7 days.    Dispense:  14 capsule    Refill:  0     Return if symptoms worsen or fail to improve.   The entirety of the information documented in the History of Present Illness, Review of Systems and Physical Exam were personally obtained by me. Portions of this information were initially documented by Lynford Humphrey, CMA and reviewed by me for thoroughness and accuracy.    Joren Rehm, Dionne Bucy, MD MPH [facility]

## 2019-08-12 NOTE — Patient Instructions (Signed)
Urinary Tract Infection, Adult A urinary tract infection (UTI) is an infection of any part of the urinary tract. The urinary tract includes:  The kidneys.  The ureters.  The bladder.  The urethra. These organs make, store, and get rid of pee (urine) in the body. What are the causes? This is caused by germs (bacteria) in your genital area. These germs grow and cause swelling (inflammation) of your urinary tract. What increases the risk? You are more likely to develop this condition if:  You have a small, thin tube (catheter) to drain pee.  You cannot control when you pee or poop (incontinence).  You are female, and: ? You use these methods to prevent pregnancy:  A medicine that kills sperm (spermicide).  A device that blocks sperm (diaphragm). ? You have low levels of a female hormone (estrogen). ? You are pregnant.  You have genes that add to your risk.  You are sexually active.  You take antibiotic medicines.  You have trouble peeing because of: ? A prostate that is bigger than normal, if you are female. ? A blockage in the part of your body that drains pee from the bladder (urethra). ? A kidney stone. ? A nerve condition that affects your bladder (neurogenic bladder). ? Not getting enough to drink. ? Not peeing often enough.  You have other conditions, such as: ? Diabetes. ? A weak disease-fighting system (immune system). ? Sickle cell disease. ? Gout. ? Injury of the spine. What are the signs or symptoms? Symptoms of this condition include:  Needing to pee right away (urgently).  Peeing often.  Peeing small amounts often.  Pain or burning when peeing.  Blood in the pee.  Pee that smells bad or not like normal.  Trouble peeing.  Pee that is cloudy.  Fluid coming from the vagina, if you are female.  Pain in the belly or lower back. Other symptoms include:  Throwing up (vomiting).  No urge to eat.  Feeling mixed up (confused).  Being tired  and grouchy (irritable).  A fever.  Watery poop (diarrhea). How is this treated? This condition may be treated with:  Antibiotic medicine.  Other medicines.  Drinking enough water. Follow these instructions at home:  Medicines  Take over-the-counter and prescription medicines only as told by your doctor.  If you were prescribed an antibiotic medicine, take it as told by your doctor. Do not stop taking it even if you start to feel better. General instructions  Make sure you: ? Pee until your bladder is empty. ? Do not hold pee for a long time. ? Empty your bladder after sex. ? Wipe from front to back after pooping if you are a female. Use each tissue one time when you wipe.  Drink enough fluid to keep your pee pale yellow.  Keep all follow-up visits as told by your doctor. This is important. Contact a doctor if:  You do not get better after 1-2 days.  Your symptoms go away and then come back. Get help right away if:  You have very bad back pain.  You have very bad pain in your lower belly.  You have a fever.  You are sick to your stomach (nauseous).  You are throwing up. Summary  A urinary tract infection (UTI) is an infection of any part of the urinary tract.  This condition is caused by germs in your genital area.  There are many risk factors for a UTI. These include having a small, thin   tube to drain pee and not being able to control when you pee or poop.  Treatment includes antibiotic medicines for germs.  Drink enough fluid to keep your pee pale yellow. This information is not intended to replace advice given to you by your health care provider. Make sure you discuss any questions you have with your health care provider. Document Revised: 05/13/2018 Document Reviewed: 12/03/2017 Elsevier Patient Education  2020 [employer] Inc[employer]  

## 2019-08-13 LAB — URINALYSIS, MICROSCOPIC ONLY: WBC, UA: >30 /hpf — AB (ref 0–5)

## 2019-08-15 ENCOUNTER — Telehealth: Payer: Self-pay

## 2019-08-15 LAB — URINE CULTURE

## 2019-08-15 NOTE — Telephone Encounter (Signed)
LMTCB 08/15/2019.  PEC please advise pt of labs below.   Thanks,   -Mickel Baas

## 2019-08-15 NOTE — Telephone Encounter (Signed)
-----   Message from Virginia Crews, MD sent at 08/15/2019 12:13 PM EST ----- Urine culture confirms UTI that is sensitive to abx prescribed.  Hope she is doing better.

## 2019-08-15 NOTE — Telephone Encounter (Signed)
Pt. Given results. States she "feels about the same." Will call back if no better.

## 2019-08-22 ENCOUNTER — Telehealth: Payer: Self-pay | Admitting: Physician Assistant

## 2019-08-22 DIAGNOSIS — T3695XA Adverse effect of unspecified systemic antibiotic, initial encounter: Secondary | ICD-10-CM

## 2019-08-22 DIAGNOSIS — B379 Candidiasis, unspecified: Secondary | ICD-10-CM

## 2019-08-22 MED ORDER — FLUCONAZOLE 150 MG PO TABS: 150.0000 mg | ORAL_TABLET | Freq: Once | ORAL | 0 refills | Status: AC

## 2019-08-22 NOTE — Telephone Encounter (Signed)
Patient advised as directed below. 

## 2019-08-22 NOTE — Telephone Encounter (Signed)
Pt stated she believes her cephALEXin (KEFLEX) 500 MG capsule  Has caused her to develop a yeast infection. She is having a lot of itching in her vaginal area and has stopped taking the medication. She is also scheduled to receive her vaccine tomorrow and wants to know if she should still get it or hold off. Please advise.

## 2019-08-22 NOTE — Telephone Encounter (Signed)
Sent in [place] and she should wait 2 weeks after completing an antibiotic before receiving to make sure the vaccine is as effective as it should be

## 2019-08-23 ENCOUNTER — Ambulatory Visit: Payer: Medicare Other

## 2019-09-01 NOTE — Progress Notes (Signed)
Patient: Alexis Lloyd Female    DOB: [birth year]   59 y.o.   MRN: [id number] Visit Date: 09/02/2019  Today's Provider: Mar Daring, PA-C   Chief Complaint  Patient presents with  . Medication Problem   Subjective:     HPI  Patient with c/o itching for the past 2 weeks on genital area. Reports the itching started after taking the Keflex and medication for the yeast was sent in for her but is not any better. She has been on antibiotics often this year due to dental abscess and other issues. She feels she probably has a STD.    Allergies  Allergen Reactions  . Allegra [Fexofenadine] Nausea And Vomiting  . Avelox [Moxifloxacin] Other (See Comments)    Hallucinations  . Bactrim [Sulfamethoxazole-Trimethoprim] Other (See Comments)    unknown  . Etodolac Nausea Only  . Fish-Derived Products Nausea And Vomiting    With "seafood"  . Penicillins Diarrhea and Nausea And Vomiting  . Shellfish Allergy Nausea Only  . Sulfa Antibiotics Other (See Comments)    unknown  . Macrobid [employer] Macro] Other (See Comments)    Constipation and globus hystericus.     Current Outpatient Medications:  .  albuterol (PROVENTIL HFA) 108 (90 Base) MCG/ACT inhaler, Inhale 2 puffs every 6 (six) hours as needed into the lungs., Disp: 3 Inhaler, Rfl: 1 .  Blood Glucose Monitoring Suppl (ONETOUCH VERIO) w/Device KIT, To check blood sugar once daily, Disp: 1 kit, Rfl: 0 .  cetirizine (ZYRTEC) 10 MG tablet, Take 1 tablet (10 mg total) by mouth daily., Disp: 90 tablet, Rfl: 1 .  glucose blood (ONETOUCH VERIO) test strip, To check blood sugar once daily, Disp: 100 each, Rfl: 12 .  ketoconazole (NIZORAL) 2 % cream, APPLY CREAM TOPICALLY TWICE DAILY TO FEET, Disp: , Rfl: 2 .  Lancets (ONETOUCH ULTRASOFT) lancets, USE LANCET(S)  TO CHECK GLUCOSE DAILY, Disp: 100 each, Rfl: 12 .  lisinopril-hydrochlorothiazide (ZESTORETIC) 20-12.5 MG tablet, TAKE 2 TABLETS BY MOUTH  DAILY, Disp: 180  tablet, Rfl: 3 .  metFORMIN (GLUCOPHAGE) 1000 MG tablet, TAKE 1 TABLET BY MOUTH  TWICE DAILY WITH A MEAL, Disp: 180 tablet, Rfl: 3 .  montelukast (SINGULAIR) 10 MG tablet, Take 1 tablet (10 mg total) by mouth daily., Disp: 30 tablet, Rfl: 11 .  Probiotic Product (PROBIOTIC ADVANCED PO), Take by mouth., Disp: , Rfl:  .  sitaGLIPtin (JANUVIA) 100 MG tablet, Take 1 tablet (100 mg total) by mouth daily., Disp: 90 tablet, Rfl: 1 .  spironolactone (ALDACTONE) 25 MG tablet, TAKE 1 TABLET BY MOUTH  DAILY, Disp: 90 tablet, Rfl: 3 .  SUMAtriptan (IMITREX) 50 MG tablet, Take 1 tablet (50 mg total) by mouth every 2 (two) hours as needed for migraine. No more than 255m in 24 hours, Disp: 10 tablet, Rfl: 0 .  verapamil (CALAN) 80 MG tablet, TAKE 1 TABLET BY MOUTH  TWICE DAILY, Disp: 180 tablet, Rfl: 3 .  promethazine (PHENERGAN) 25 MG tablet, Take 1 tablet (25 mg total) by mouth every 6 (six) hours as needed for nausea or vomiting. (Patient not taking: Reported on 08/12/2019), Disp: 30 tablet, Rfl: 0  Review of Systems  Constitutional: Negative.   Respiratory: Negative.   Cardiovascular: Negative.   Gastrointestinal: Negative.   Genitourinary: Positive for vaginal discharge. Negative for dysuria, menstrual problem, pelvic pain, vaginal bleeding and vaginal pain.       Vaginal itching    Social History   Tobacco  Use  . Smoking status: Former Smoker    Packs/day: 0.25    Years: 21.00    Pack years: 5.25    Quit date: 06/08/1997    Years since quitting: 22.2  . Smokeless tobacco: Never Used  Substance Use Topics  . Alcohol use: No      Objective:   BP 130/60 (BP Location: Left Arm, Patient Position: Sitting, Cuff Size: Large)   Pulse 60   Temp (!) 97.3 F (36.3 C) (Temporal)   Resp 16   Wt 219 lb (99.3 kg)   BMI 42.77 kg/m  Vitals:   09/02/19 0917  BP: 130/60  Pulse: 60  Resp: 16  Temp: (!) 97.3 F (36.3 C)  TempSrc: Temporal  Weight: 219 lb (99.3 kg)  Body mass index is 42.77  kg/m.   Physical Exam Vitals reviewed.  Constitutional:      General: She is not in acute distress.    Appearance: Normal appearance. She is well-developed. She is obese. She is not ill-appearing.  HENT:     Head: Normocephalic and atraumatic.  Eyes:     General: No scleral icterus. Pulmonary:     Effort: Pulmonary effort is normal. No respiratory distress.  Genitourinary:    General: Normal vulva.     Exam position: Supine.     Pubic Area: No rash.      Labia:        Right: No rash or tenderness.        Left: No rash or tenderness.      Urethra: No urethral pain, urethral swelling or urethral lesion.     Vagina: Vaginal discharge (copious amounts of thin white discharge with some areas of thicker discharge resembling cottage cheese; no foul odor) and erythema present. No tenderness or lesions.     Cervix: No cervical motion tenderness, discharge, friability, lesion, erythema or cervical bleeding.     Uterus: Normal.      Adnexa: Right adnexa normal and left adnexa normal.  Musculoskeletal:     Cervical back: Normal range of motion and neck supple.  Lymphadenopathy:     Lower Body: No right inguinal adenopathy. No left inguinal adenopathy.  Neurological:     Mental Status: She is alert.  Psychiatric:        Mood and Affect: Mood normal.        Behavior: Behavior normal.        Thought Content: Thought content normal.        Judgment: Judgment normal.      No results found for any visits on 09/02/19.     Assessment & Plan    1. Vaginal discharge Vaginal swab collected today. Suspect antibiotic induced yeast infection. Will change treatment since did not resolve with Diflucan. Change to ketoconazole daily x 7 days as below. Terazol cream for external use topically for itching. Will f/u pending results. Call if still not improving or worsening.  - Cervicovaginal ancillary only - ketoconazole (NIZORAL) 200 MG tablet; Take 1 tablet (200 mg total) by mouth daily.   Dispense: 7 tablet; Refill: 0 - terconazole (TERAZOL 7) 0.4 % vaginal cream; Apply topically to external vaginal tissue at bedtime for itching  Dispense: 45 g; Refill: 0  2. Vaginal itching See above medical treatment plan. - Cervicovaginal ancillary only - ketoconazole (NIZORAL) 200 MG tablet; Take 1 tablet (200 mg total) by mouth daily.  Dispense: 7 tablet; Refill: 0 - terconazole (TERAZOL 7) 0.4 % vaginal cream; Apply topically to external vaginal  tissue at bedtime for itching  Dispense: 45 g; Refill: 0     Mar Daring, PA-C  [facility]

## 2019-09-02 ENCOUNTER — Other Ambulatory Visit: Payer: Self-pay

## 2019-09-02 ENCOUNTER — Encounter: Payer: Self-pay | Admitting: Physician Assistant

## 2019-09-02 ENCOUNTER — Other Ambulatory Visit (HOSPITAL_COMMUNITY)
Admission: RE | Admit: 2019-09-02 | Discharge: 2019-09-02 | Disposition: A | Discharge status: Home / Self Care | Payer: Medicare Other | Source: Ambulatory Visit | Attending: Physician Assistant | Admitting: Physician Assistant

## 2019-09-02 ENCOUNTER — Ambulatory Visit (INDEPENDENT_AMBULATORY_CARE_PROVIDER_SITE_OTHER): Payer: Medicare Other | Admitting: Physician Assistant

## 2019-09-02 VITALS — BP 130/60 | HR 60 | Temp 97.3°F | Resp 16 | Wt 219.0 lb

## 2019-09-02 DIAGNOSIS — N898 Other specified noninflammatory disorders of vagina: Secondary | ICD-10-CM

## 2019-09-02 MED ORDER — TERCONAZOLE 0.4 % VA CREA: TOPICAL_CREAM | VAGINAL | 0 refills | Status: DC

## 2019-09-02 MED ORDER — KETOCONAZOLE 200 MG PO TABS: 200.0000 mg | ORAL_TABLET | Freq: Every day | ORAL | 0 refills | Status: DC

## 2019-09-02 NOTE — Patient Instructions (Signed)
Vaginal Yeast Infection, Adult  Vaginal yeast infection is a condition that causes vaginal discharge as well as soreness, swelling, and redness (inflammation) of the vagina. This is a common condition. Some women get this infection frequently. What are the causes? This condition is caused by a change in the normal balance of the yeast (candida) and bacteria that live in the vagina. This change causes an overgrowth of yeast, which causes the inflammation. What increases the risk? The condition is more likely to develop in women who:  Take antibiotic medicines.  Have diabetes.  Take birth control pills.  Are pregnant.  Douche often.  Have a weak body defense system (immune system).  Have been taking steroid medicines for a long time.  Frequently wear tight clothing. What are the signs or symptoms? Symptoms of this condition include:  White, thick, creamy vaginal discharge.  Swelling, itching, redness, and irritation of the vagina. The lips of the vagina (vulva) may be affected as well.  Pain or a burning feeling while urinating.  Pain during sex. How is this diagnosed? This condition is diagnosed based on:  Your medical history.  A physical exam.  A pelvic exam. Your health care provider will examine a sample of your vaginal discharge under a microscope. Your health care provider may send this sample for testing to confirm the diagnosis. How is this treated? This condition is treated with medicine. Medicines may be over-the-counter or prescription. You may be told to use one or more of the following:  Medicine that is taken by mouth (orally).  Medicine that is applied as a cream (topically).  Medicine that is inserted directly into the vagina (suppository). Follow these instructions at home:  Lifestyle  Do not have sex until your health care provider approves. Tell your sex partner that you have a yeast infection. That person should go to his or her health care  provider and ask if they should also be treated.  Do not wear tight clothes, such as pantyhose or tight pants.  Wear breathable cotton underwear. General instructions  Take or apply over-the-counter and prescription medicines only as told by your health care provider.  Eat more yogurt. This may help to keep your yeast infection from returning.  Do not use tampons until your health care provider approves.  Try taking a sitz bath to help with discomfort. This is a warm water bath that is taken while you are sitting down. The water should only come up to your hips and should cover your buttocks. Do this 3-4 times per day or as told by your health care provider.  Do not douche.  If you have diabetes, keep your blood sugar levels under control.  Keep all follow-up visits as told by your health care provider. This is important. Contact a health care provider if:  You have a fever.  Your symptoms go away and then return.  Your symptoms do not get better with treatment.  Your symptoms get worse.  You have new symptoms.  You develop blisters in or around your vagina.  You have blood coming from your vagina and it is not your menstrual period.  You develop pain in your abdomen. Summary  Vaginal yeast infection is a condition that causes discharge as well as soreness, swelling, and redness (inflammation) of the vagina.  This condition is treated with medicine. Medicines may be over-the-counter or prescription.  Take or apply over-the-counter and prescription medicines only as told by your health care provider.  Do not douche.   Do not have sex or use tampons until your health care provider approves.  Contact a health care provider if your symptoms do not get better with treatment or your symptoms go away and then return. This information is not intended to replace advice given to you by your health care provider. Make sure you discuss any questions you have with your health care  provider. Document Revised: 12/24/2018 Document Reviewed: 10/12/2017 Elsevier Patient Education  2020 Elsevier Inc.  

## 2019-09-05 ENCOUNTER — Telehealth: Payer: Self-pay

## 2019-09-05 LAB — CERVICOVAGINAL ANCILLARY ONLY
Bacterial Vaginitis (gardnerella): NEGATIVE
Candida Glabrata: NEGATIVE
Candida Vaginitis: POSITIVE — AB
Chlamydia: NEGATIVE
Comment: NEGATIVE
Comment: NEGATIVE
Comment: NEGATIVE
Comment: NEGATIVE
Comment: NEGATIVE
Comment: NORMAL
Neisseria Gonorrhea: NEGATIVE
Trichomonas: NEGATIVE

## 2019-09-05 NOTE — Telephone Encounter (Signed)
-----   Message from Mar Daring, [place] sent at 09/05/2019  1:01 PM EDT ----- Swab only positive for yeast as expected. Continue treatment as was prescribed.

## 2019-09-05 NOTE — Telephone Encounter (Signed)
LMTCB-If patient calls OK for [facility] to give the results.

## 2019-09-06 NOTE — Telephone Encounter (Signed)
LMTCB ok for PEC to give results to patient if patient calls back.

## 2019-09-06 NOTE — Telephone Encounter (Signed)
Reviewed results and physician's note with the patient. No further questions.

## 2019-09-13 ENCOUNTER — Ambulatory Visit: Payer: Medicare Other | Attending: Internal Medicine

## 2019-09-13 DIAGNOSIS — Z23 Encounter for immunization: Secondary | ICD-10-CM

## 2019-09-13 NOTE — Progress Notes (Signed)
   Covid-19 Vaccination Clinic  Name:  Alexis Lloyd    MRN: [id number] DOB: [birth year]  09/13/2019  [name] was observed post Covid-19 immunization for 15 minutes without incident. She was provided with Vaccine Information Sheet and instruction to access the V-Safe system.   [name] was instructed to call 911 with any severe reactions post vaccine: Marland Kitchen Difficulty breathing  . Swelling of face and throat  . A fast heartbeat  . A bad rash all over body  . Dizziness and weakness   Immunizations Administered    Name Date Dose VIS Date Route   Pfizer COVID-19 Vaccine 09/13/2019  2:14 PM 0.3 mL 05/20/2019 Intramuscular   Manufacturer: [place]   Lot: [id number]   [place]: [id number]

## 2019-09-20 DIAGNOSIS — M170 Bilateral primary osteoarthritis of knee: Secondary | ICD-10-CM | POA: Diagnosis not present

## 2019-09-21 NOTE — Progress Notes (Signed)
MyChart Video Visit     Virtual Visit via Video Note   This visit type was conducted due to national recommendations for restrictions regarding the COVID-19 Pandemic (e.g. social distancing) in an effort to limit this patient's exposure and mitigate transmission in our community. This patient is at least at moderate risk for complications without adequate follow up. This format is felt to be most appropriate for this patient at this time. Physical exam was limited by quality of the video and audio technology used for the visit.   Patient location: Home Provider location: [facility]   Patient: Alexis Lloyd   DOB: 16-Sep-1960   59 y.o. Female  MRN: [id number] Visit Date: 09/22/2019  Today's Provider: Mar Daring, PA-C  Subjective:    Chief Complaint  Patient presents with  . Vaginal Itching   Vaginal Itching The patient's primary symptoms include genital itching. This is a new problem. Episode onset: over 1 month ago  The problem occurs constantly. The problem has been unchanged. The problem affects both sides. She is not pregnant. Associated symptoms include frequency. Pertinent negatives include no discolored urine, dysuria or hematuria. The vaginal discharge was normal. There has been no bleeding. She has not been passing clots. She has not been passing tissue.    -Pt is Due for mammogram -Pt is Due for Eye Exam    Patient Active Problem List   Diagnosis Date Noted  . Recurrent UTI 01/22/2018  . Recurrent vaginitis 01/22/2018  . History of Helicobacter pylori infection 11/05/2017  . Intrinsic eczema 11/05/2017  . CLE (columnar lined esophagus)   . Stomach irritation   . Gastric polyp   . Problems with swallowing and mastication   . Heartburn   . Benign neoplasm of transverse colon   . Special screening for malignant neoplasms, colon   . Internal hemorrhoids   . Diverticulosis of large intestine without diverticulitis   . Warts of foot 06/30/2017  . Achilles tendon  contracture 01/28/2017  . Hallux rigidus of left foot 01/28/2017  . Hallux rigidus, right foot 01/28/2017  . Pes planus 01/28/2017  . Posterior tibial tendinitis of left lower extremity 01/28/2017  . Posterior tibial tendinitis of right lower extremity 01/28/2017  . Asthma 09/03/2015  . Foot pain 07/16/2015  . DM type 2, uncontrolled, with neuropathy ([place]) 07/13/2015  . Abnormal finding on mammography, microcalcification 04/25/2015  . Intraductal carcinoma of breast 04/24/2015  . Airway hyperreactivity 11/18/2014  . BMI 40.0-44.9, adult ([place]) 11/18/2014  . Breast cyst 11/18/2014  . Diabetic neuropathy ([place]) 11/18/2014  . GERD (gastroesophageal reflux disease) 11/18/2014  . 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 Hypercholesteremia 11/18/2014  . Hypertension 11/18/2014  . Arthritis, degenerative 11/18/2014  . Allergic rhinitis, seasonal 11/18/2014  . OSA (obstructive sleep apnea) 11/18/2014  . AC (acromioclavicular) arthritis 11/08/2014  . Personal history of breast cancer 10/04/2014  . Primary osteoarthritis of right knee 11/14/2013   Past Medical History:  Diagnosis Date  . Allergy   . Arthritis   . Breast cancer ([place])   . Breast cancer, left breast ([place]) 10/04/2014  . Diabetes mellitus without complication ([place])   . GERD (gastroesophageal reflux disease)   . Hyperlipidemia   . Hypertension   . Osteoporosis   . Sleep apnea    Allergies  Allergen Reactions  . Allegra [Fexofenadine] Nausea And Vomiting  . Avelox [Moxifloxacin] Other (See Comments)    Hallucinations  . Bactrim [Sulfamethoxazole-Trimethoprim] Other (See Comments)    unknown  . Etodolac Nausea Only  .  Fish-Derived Products Nausea And Vomiting    With "seafood"  . Penicillins Diarrhea and Nausea And Vomiting  . Shellfish Allergy Nausea Only  . Sulfa Antibiotics Other (See Comments)    unknown  . Macrobid [employer] Macro] Other (See Comments)    Constipation and globus hystericus.       Medications: Outpatient Medications Prior to Visit  Medication Sig  . albuterol (PROVENTIL HFA) 108 (90 Base) MCG/ACT inhaler Inhale 2 puffs every 6 (six) hours as needed into the lungs.  . Blood Glucose Monitoring Suppl (ONETOUCH VERIO) w/Device KIT To check blood sugar once daily  . cetirizine (ZYRTEC) 10 MG tablet Take 1 tablet (10 mg total) by mouth daily.  Marland Kitchen glucose blood (ONETOUCH VERIO) test strip To check blood sugar once daily  . ketoconazole (NIZORAL) 2 % cream APPLY CREAM TOPICALLY TWICE DAILY TO FEET  . ketoconazole (NIZORAL) 200 MG tablet Take 1 tablet (200 mg total) by mouth daily.  . Lancets (ONETOUCH ULTRASOFT) lancets USE LANCET(S)  TO CHECK GLUCOSE DAILY  . lisinopril-hydrochlorothiazide (ZESTORETIC) 20-12.5 MG tablet TAKE 2 TABLETS BY MOUTH  DAILY  . metFORMIN (GLUCOPHAGE) 1000 MG tablet TAKE 1 TABLET BY MOUTH  TWICE DAILY WITH A MEAL  . montelukast (SINGULAIR) 10 MG tablet Take 1 tablet (10 mg total) by mouth daily.  . Probiotic Product (PROBIOTIC ADVANCED PO) Take by mouth.  . promethazine (PHENERGAN) 25 MG tablet Take 1 tablet (25 mg total) by mouth every 6 (six) hours as needed for nausea or vomiting. (Patient not taking: Reported on 08/12/2019)  . sitaGLIPtin (JANUVIA) 100 MG tablet Take 1 tablet (100 mg total) by mouth daily.  Marland Kitchen spironolactone (ALDACTONE) 25 MG tablet TAKE 1 TABLET BY MOUTH  DAILY  . SUMAtriptan (IMITREX) 50 MG tablet Take 1 tablet (50 mg total) by mouth every 2 (two) hours as needed for migraine. No more than 237m in 24 hours  . terconazole (TERAZOL 7) 0.4 % vaginal cream Apply topically to external vaginal tissue at bedtime for itching  . verapamil (CALAN) 80 MG tablet TAKE 1 TABLET BY MOUTH  TWICE DAILY   No facility-administered medications prior to visit.    Last CBC Lab Results  Component Value Date   WBC 9.4 07/07/2019   HGB 11.9 07/07/2019   HCT 34.9 07/07/2019   MCV 85 07/07/2019   MCH 29.0 07/07/2019   RDW 13.0 07/07/2019   PLT  324 071/24/5809  Last metabolic panel Lab Results  Component Value Date   GLUCOSE 178 (H) 07/07/2019   NA 136 07/07/2019   K 4.0 07/07/2019   CL 95 (L) 07/07/2019   CO2 23 07/07/2019   BUN 14 07/07/2019   CREATININE 0.81 07/07/2019   GFRNONAA 79 07/07/2019   GFRAA 91 07/07/2019   CALCIUM 10.1 07/07/2019   PROT 6.9 09/16/2018   ALBUMIN 4.0 09/16/2018   LABGLOB 2.9 09/16/2018   AGRATIO 1.4 09/16/2018   BILITOT 0.4 09/16/2018   ALKPHOS 82 09/16/2018   AST 7 09/16/2018   ALT 19 09/16/2018   ANIONGAP 8 02/21/2018   Last hemoglobin A1c Lab Results  Component Value Date   HGBA1C 8.7 (H) 07/07/2019      Review of Systems  Constitutional: Negative.   Respiratory: Negative.   Cardiovascular: Negative.   Genitourinary: Positive for frequency. Negative for dysuria and hematuria.  Musculoskeletal: Negative.          Objective:    There were no vitals taken for this visit. BP Readings from Last 3 Encounters:  09/02/19 130/60  08/12/19 (!) 148/71  07/07/19 122/70   Wt Readings from Last 3 Encounters:  09/02/19 219 lb (99.3 kg)  08/12/19 223 lb (101.2 kg)  07/07/19 217 lb (98.4 kg)      Physical Exam       Assessment & Plan:     1. Vaginal itching Internal vaginal irritation and discharge improved but still having external irritation. Will use Terazol cream and triamcinolone cream together at bedtime on external tissues only. Call if not improving for consideration of referral to GYN for exclusion of lichen sclerosis.   - terconazole (TERAZOL 7) 0.4 % vaginal cream; Apply topically to external vaginal tissue at bedtime for itching  Dispense: 45 g; Refill: 0 - triamcinolone cream (KENALOG) 0.1 %; Apply 1 application topically 2 (two) times daily.  Dispense: 30 g; Refill: 0   No follow-ups on file.     I discussed the assessment and treatment plan with the patient. The patient was provided an opportunity to ask questions and all were answered. The patient agreed  with the plan and demonstrated an understanding of the instructions.   The patient was advised to call back or seek an in-person evaluation if the symptoms worsen or if the condition fails to improve as anticipated.  I provided 7 minutes of non-face-to-face time during this encounter.    [place] [facility] [phone] (phone) [phone] (fax)  [place]

## 2019-09-22 ENCOUNTER — Ambulatory Visit (INDEPENDENT_AMBULATORY_CARE_PROVIDER_SITE_OTHER): Payer: Medicare Other | Admitting: Physician Assistant

## 2019-09-22 ENCOUNTER — Encounter: Payer: Self-pay | Admitting: Physician Assistant

## 2019-09-22 DIAGNOSIS — N898 Other specified noninflammatory disorders of vagina: Secondary | ICD-10-CM

## 2019-09-22 MED ORDER — TRIAMCINOLONE ACETONIDE 0.1 % EX CREA: 1.0000 "application " | TOPICAL_CREAM | Freq: Two times a day (BID) | CUTANEOUS | 0 refills | Status: DC

## 2019-09-22 MED ORDER — TERCONAZOLE 0.4 % VA CREA: TOPICAL_CREAM | VAGINAL | 0 refills | Status: DC

## 2019-10-04 DIAGNOSIS — M1711 Unilateral primary osteoarthritis, right knee: Secondary | ICD-10-CM | POA: Diagnosis not present

## 2019-10-12 ENCOUNTER — Telehealth: Payer: Self-pay

## 2019-10-12 DIAGNOSIS — N898 Other specified noninflammatory disorders of vagina: Secondary | ICD-10-CM

## 2019-10-12 NOTE — Telephone Encounter (Signed)
Copied from [place] [phone]. Topic: Referral - Request for Referral >> Oct 12, 2019  8:40 AM Rainey Pines A wrote: Has patient seen PCP for this complaint? Yes *If NO, is insurance requiring patient see PCP for this issue before PCP can refer them? Referral for which specialty: gynecologist Preferred provider/office: No preference Reason for referral: vaginal bumps and frequent urination

## 2019-10-12 NOTE — Telephone Encounter (Signed)
Referral placed.

## 2019-10-14 ENCOUNTER — Ambulatory Visit (INDEPENDENT_AMBULATORY_CARE_PROVIDER_SITE_OTHER): Payer: Medicare Other | Admitting: Physician Assistant

## 2019-10-14 ENCOUNTER — Other Ambulatory Visit: Payer: Self-pay

## 2019-10-14 ENCOUNTER — Other Ambulatory Visit (HOSPITAL_COMMUNITY)
Admission: RE | Admit: 2019-10-14 | Discharge: 2019-10-14 | Disposition: A | Discharge status: Home / Self Care | Payer: Medicare Other | Source: Ambulatory Visit | Attending: Physician Assistant | Admitting: Physician Assistant

## 2019-10-14 ENCOUNTER — Encounter: Payer: Self-pay | Admitting: Physician Assistant

## 2019-10-14 VITALS — BP 121/70 | HR 59 | Temp 97.0°F | Resp 16 | Wt 214.0 lb

## 2019-10-14 DIAGNOSIS — Z6841 Body Mass Index (BMI) 40.0 and over, adult: Secondary | ICD-10-CM

## 2019-10-14 DIAGNOSIS — N898 Other specified noninflammatory disorders of vagina: Secondary | ICD-10-CM | POA: Diagnosis present

## 2019-10-14 DIAGNOSIS — E1140 Type 2 diabetes mellitus with diabetic neuropathy, unspecified: Secondary | ICD-10-CM | POA: Diagnosis not present

## 2019-10-14 DIAGNOSIS — R350 Frequency of micturition: Secondary | ICD-10-CM | POA: Diagnosis not present

## 2019-10-14 DIAGNOSIS — E1165 Type 2 diabetes mellitus with hyperglycemia: Secondary | ICD-10-CM

## 2019-10-14 DIAGNOSIS — IMO0002 Reserved for concepts with insufficient information to code with codable children: Secondary | ICD-10-CM

## 2019-10-14 DIAGNOSIS — B078 Other viral warts: Secondary | ICD-10-CM

## 2019-10-14 DIAGNOSIS — L600 Ingrowing nail: Secondary | ICD-10-CM | POA: Diagnosis not present

## 2019-10-14 DIAGNOSIS — L723 Sebaceous cyst: Secondary | ICD-10-CM

## 2019-10-14 DIAGNOSIS — E66813 Obesity, class 3: Secondary | ICD-10-CM

## 2019-10-14 LAB — POCT URINALYSIS DIPSTICK
Bilirubin, UA: NEGATIVE
Glucose, UA: POSITIVE — AB
Ketones, UA: NEGATIVE
Nitrite, UA: NEGATIVE
Protein, UA: NEGATIVE
Spec Grav, UA: 1.02 (ref 1.010–1.025)
Urobilinogen, UA: 0.2 E.U./dL
pH, UA: 6 (ref 5.0–8.0)

## 2019-10-14 LAB — POCT GLYCOSYLATED HEMOGLOBIN (HGB A1C)
Est. average glucose Bld gHb Est-mCnc: 306
Hemoglobin A1C: 12.3 % — AB (ref 4.0–5.6)

## 2019-10-14 MED ORDER — FLUCONAZOLE 150 MG PO TABS: 150.0000 mg | ORAL_TABLET | Freq: Every day | ORAL | 0 refills | Status: DC

## 2019-10-14 MED ORDER — GLIPIZIDE 10 MG PO TABS: 10.0000 mg | ORAL_TABLET | Freq: Two times a day (BID) | ORAL | 1 refills | Status: DC

## 2019-10-14 MED ORDER — CEPHALEXIN 500 MG PO CAPS: 500.0000 mg | ORAL_CAPSULE | Freq: Two times a day (BID) | ORAL | 0 refills | Status: DC

## 2019-10-14 NOTE — Patient Instructions (Signed)

## 2019-10-14 NOTE — Progress Notes (Signed)
Established patient visit   Patient: Alexis Lloyd   DOB: April 22, 1961   59 y.o. Female  MRN: [id number] Visit Date: 10/14/2019  Today's healthcare provider: Mar Daring, PA-C   Chief Complaint  Patient presents with  . Rash  . Urinary Frequency   Subjective    HPI Patient coming here with c/o vaginal rash. She reports is the same bumps that she has been seen for before. Reports that she has notice some in her R leg which she thinks is the same.She reports that it stings a lot. Reports she is scheduled for Monday to see GYN  Frequent urination: this started since she started the itching and scratching on private area. Reports that she has urgency with some incontinence. It burns but feels is coming from the scratching and the bumps. No painful urination.  Patient Active Problem List   Diagnosis Date Noted  . Recurrent UTI 01/22/2018  . Recurrent vaginitis 01/22/2018  . History of Helicobacter pylori infection 11/05/2017  . Intrinsic eczema 11/05/2017  . CLE (columnar lined esophagus)   . Stomach irritation   . Gastric polyp   . Problems with swallowing and mastication   . Heartburn   . Benign neoplasm of transverse colon   . Special screening for malignant neoplasms, colon   . Internal hemorrhoids   . Diverticulosis of large intestine without diverticulitis   . Warts of foot 06/30/2017  . Achilles tendon contracture 01/28/2017  . Hallux rigidus of left foot 01/28/2017  . Hallux rigidus, right foot 01/28/2017  . Pes planus 01/28/2017  . Posterior tibial tendinitis of left lower extremity 01/28/2017  . Posterior tibial tendinitis of right lower extremity 01/28/2017  . Asthma 09/03/2015  . Foot pain 07/16/2015  . DM type 2, uncontrolled, with neuropathy ([place]) 07/13/2015  . Abnormal finding on mammography, microcalcification 04/25/2015  . Intraductal carcinoma of breast 04/24/2015  . Airway hyperreactivity 11/18/2014  . BMI 40.0-44.9, adult ([place]) 11/18/2014    . Breast cyst 11/18/2014  . Diabetic neuropathy ([place]) 11/18/2014  . GERD (gastroesophageal reflux disease) 11/18/2014  . 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 Hypercholesteremia 11/18/2014  . Hypertension 11/18/2014  . Arthritis, degenerative 11/18/2014  . Allergic rhinitis, seasonal 11/18/2014  . OSA (obstructive sleep apnea) 11/18/2014  . AC (acromioclavicular) arthritis 11/08/2014  . Personal history of breast cancer 10/04/2014  . Primary osteoarthritis of right knee 11/14/2013   Past Medical History:  Diagnosis Date  . Allergy   . Arthritis   . Breast cancer ([place])   . Breast cancer, left breast ([place]) 10/04/2014  . Diabetes mellitus without complication ([place])   . GERD (gastroesophageal reflux disease)   . Hyperlipidemia   . Hypertension   . Osteoporosis   . Sleep apnea        Medications: Outpatient Medications Prior to Visit  Medication Sig  . albuterol (PROVENTIL HFA) 108 (90 Base) MCG/ACT inhaler Inhale 2 puffs every 6 (six) hours as needed into the lungs.  . Blood Glucose Monitoring Suppl (ONETOUCH VERIO) w/Device KIT To check blood sugar once daily  . cetirizine (ZYRTEC) 10 MG tablet Take 1 tablet (10 mg total) by mouth daily.  Marland Kitchen glucose blood (ONETOUCH VERIO) test strip To check blood sugar once daily  . ketoconazole (NIZORAL) 2 % cream APPLY CREAM TOPICALLY TWICE DAILY TO FEET  . Lancets (ONETOUCH ULTRASOFT) lancets USE LANCET(S)  TO CHECK GLUCOSE DAILY  . lisinopril-hydrochlorothiazide (ZESTORETIC) 20-12.5 MG tablet TAKE 2 TABLETS BY MOUTH  DAILY  . metFORMIN (GLUCOPHAGE)  1000 MG tablet TAKE 1 TABLET BY MOUTH  TWICE DAILY WITH A MEAL  . montelukast (SINGULAIR) 10 MG tablet Take 1 tablet (10 mg total) by mouth daily.  . sitaGLIPtin (JANUVIA) 100 MG tablet Take 1 tablet (100 mg total) by mouth daily.  Marland Kitchen spironolactone (ALDACTONE) 25 MG tablet TAKE 1 TABLET BY MOUTH  DAILY  . SUMAtriptan (IMITREX) 50 MG tablet Take 1 tablet (50 mg total) by mouth every 2 (two) hours as  needed for migraine. No more than 262m in 24 hours  . verapamil (CALAN) 80 MG tablet TAKE 1 TABLET BY MOUTH  TWICE DAILY  . [DISCONTINUED] terconazole (TERAZOL 7) 0.4 % vaginal cream Apply topically to external vaginal tissue at bedtime for itching  . [DISCONTINUED] triamcinolone cream (KENALOG) 0.1 % Apply 1 application topically 2 (two) times daily.  . [DISCONTINUED] ketoconazole (NIZORAL) 200 MG tablet Take 1 tablet (200 mg total) by mouth daily. (Patient not taking: Reported on 10/14/2019)  . [DISCONTINUED] Probiotic Product (PROBIOTIC ADVANCED PO) Take by mouth.  . [DISCONTINUED] promethazine (PHENERGAN) 25 MG tablet Take 1 tablet (25 mg total) by mouth every 6 (six) hours as needed for nausea or vomiting. (Patient not taking: Reported on 08/12/2019)   No facility-administered medications prior to visit.    Review of Systems  Constitutional: Negative for appetite change, chills, fatigue and fever.  Respiratory: Negative for chest tightness and shortness of breath.   Cardiovascular: Negative for chest pain and palpitations.  Gastrointestinal: Negative for abdominal pain, nausea and vomiting.  Genitourinary: Positive for frequency, urgency and vaginal discharge.  Skin: Positive for rash.  Neurological: Negative for dizziness and weakness.    Last CBC Lab Results  Component Value Date   WBC 9.4 07/07/2019   HGB 11.9 07/07/2019   HCT 34.9 07/07/2019   MCV 85 07/07/2019   MCH 29.0 07/07/2019   RDW 13.0 07/07/2019   PLT 324 012/24/8250  Last metabolic panel Lab Results  Component Value Date   GLUCOSE 178 (H) 07/07/2019   NA 136 07/07/2019   K 4.0 07/07/2019   CL 95 (L) 07/07/2019   CO2 23 07/07/2019   BUN 14 07/07/2019   CREATININE 0.81 07/07/2019   GFRNONAA 79 07/07/2019   GFRAA 91 07/07/2019   CALCIUM 10.1 07/07/2019   PROT 6.9 09/16/2018   ALBUMIN 4.0 09/16/2018   LABGLOB 2.9 09/16/2018   AGRATIO 1.4 09/16/2018   BILITOT 0.4 09/16/2018   ALKPHOS 82 09/16/2018   AST 7  09/16/2018   ALT 19 09/16/2018   ANIONGAP 8 02/21/2018      Objective    BP 121/70 (BP Location: Right Arm, Patient Position: Sitting, Cuff Size: Large)   Pulse (!) 59   Temp (!) 97 F (36.1 C) (Temporal)   Resp 16   Wt 214 lb (97.1 kg)   BMI 41.79 kg/m  BP Readings from Last 3 Encounters:  10/17/19 110/70  10/14/19 121/70  09/02/19 130/60   Wt Readings from Last 3 Encounters:  10/17/19 216 lb (98 kg)  10/14/19 214 lb (97.1 kg)  09/02/19 219 lb (99.3 kg)      Physical Exam Vitals reviewed.  Constitutional:      General: She is not in acute distress.    Appearance: Normal appearance. She is well-developed. She is obese. She is not ill-appearing or diaphoretic.  Neck:     Thyroid: No thyromegaly.     Vascular: No JVD.     Trachea: No tracheal deviation.  Cardiovascular:  Rate and Rhythm: Normal rate and regular rhythm.     Pulses: Normal pulses.     Heart sounds: Normal heart sounds. No murmur. No friction rub. No gallop.   Pulmonary:     Effort: Pulmonary effort is normal. No respiratory distress.     Breath sounds: Normal breath sounds. No wheezing or rales.  Abdominal:     Hernia: There is no hernia in the left inguinal area or right inguinal area.  Genitourinary:    General: Normal vulva.     Exam position: Supine.     Pubic Area: No rash.      Labia:        Right: No rash, tenderness, lesion or injury.        Left: Lesion present. No rash, tenderness or injury.      Urethra: No prolapse, urethral pain, urethral swelling or urethral lesion.     Vagina: Vaginal discharge (thick, cottage cheese like consistency, white), erythema and tenderness present.     Cervix: Normal.     Uterus: Normal.      Adnexa: Right adnexa normal and left adnexa normal.     Rectum: Normal.    Musculoskeletal:     Cervical back: Normal range of motion and neck supple.  Lymphadenopathy:     Cervical: No cervical adenopathy.     Lower Body: No right inguinal adenopathy. No  left inguinal adenopathy.  Neurological:     Mental Status: She is alert.       Results for orders placed or performed in visit on 10/14/19  Urine Culture   Specimen: Urine   UR  Result Value Ref Range   Urine Culture, Routine Final report (A)    Organism ID, Bacteria Escherichia coli (A)    Antimicrobial Susceptibility Comment   POCT urinalysis dipstick  Result Value Ref Range   Color, UA Yellow    Clarity, UA cloudy    Glucose, UA Positive (A) Negative   Bilirubin, UA Negative    Ketones, UA Negative    Spec Grav, UA 1.020 1.010 - 1.025   Blood, UA small    pH, UA 6.0 5.0 - 8.0   Protein, UA Negative Negative   Urobilinogen, UA 0.2 0.2 or 1.0 E.U./dL   Nitrite, UA Negative    Leukocytes, UA Small (1+) (A) Negative   Appearance     Odor    POCT HgB A1C  Result Value Ref Range   Hemoglobin A1C 12.3 (A) 4.0 - 5.6 %   Est. average glucose Bld gHb Est-mCnc 306   Cervicovaginal ancillary only  Result Value Ref Range   Neisseria Gonorrhea Negative    Chlamydia Negative    Trichomonas Negative    Bacterial Vaginitis (gardnerella) Negative    Candida Vaginitis Positive (A)    Candida Glabrata Negative    Comment Normal Reference Range Candida Species - Negative    Comment Normal Reference Range Candida Galbrata - Negative    Comment Normal Reference Range Trichomonas - Negative    Comment      Normal Reference Range Bacterial Vaginosis - Negative   Comment Normal Reference [place] Chlamydia - Negative    Comment      Normal Reference Range Neisseria Gonorrhea - Negative    Assessment & Plan     1. Vaginal discharge Swab collected today but suspect just significant yeast infection. - Cervicovaginal ancillary only  2. Frequent urination Worsening symptoms. UA positive. Will treat empirically with Keflex. Continue to push fluids. Urine  sent for culture. Will follow up pending C&S results. She is to call if symptoms do not improve or if they worsen.  - POCT urinalysis  dipstick - Urine Culture - cephALEXin (KEFLEX) 500 MG capsule; Take 1 capsule (500 mg total) by mouth 2 (two) times daily.  Dispense: 14 capsule; Refill: 0  3. Ingrown nail of great toe of right foot Noted to have a slight ingrown nail of the right foot. Since patient is diabetic will treat possible infection with Keflex as below. Referral placed to podiatry for treatment considerations. Consult appreciated.  - Ambulatory referral to Podiatry - cephALEXin (KEFLEX) 500 MG capsule; Take 1 capsule (500 mg total) by mouth 2 (two) times daily.  Dispense: 14 capsule; Refill: 0  4. Class 3 severe obesity due to excess calories with serious comorbidity and body mass index (BMI) of 40.0 to 44.9 in adult [facility]) Counseled patient on healthy lifestyle modifications including dieting and exercise.   5. DM type 2, uncontrolled, with neuropathy (HCC) Worsening. A1c today at 12.3, up from 8.7. Add glipizide BID as below. Referral also placed for diabetic education and endocrinology for uncontrolled diabetes.  - POCT HgB A1C - Ambulatory referral to Podiatry - glipiZIDE (GLUCOTROL) 10 MG tablet; Take 1 tablet (10 mg total) by mouth 2 (two) times daily before a meal. (Patient not taking: Reported on 10/17/2019)  Dispense: 180 tablet; Refill: 1 - Ambulatory referral to diabetic education - Ambulatory referral to Endocrinology  6. Other viral warts None today.   7. Sebaceous cyst Noted on left vaginal labia. Treat with keflex as noted below.  - cephALEXin (KEFLEX) 500 MG capsule; Take 1 capsule (500 mg total) by mouth 2 (two) times daily.  Dispense: 14 capsule; Refill: 0   Return in about 3 months (around 01/14/2020).      Reynolds Bowl, PA-C, have reviewed all documentation for this visit. The documentation on 10/19/19 for the exam, diagnosis, procedures, and orders are all accurate and complete.   Rubye Beach  [facility] [phone] (phone) [phone]  (fax)  [place]

## 2019-10-16 LAB — URINE CULTURE

## 2019-10-17 ENCOUNTER — Other Ambulatory Visit: Payer: Self-pay

## 2019-10-17 ENCOUNTER — Telehealth: Payer: Self-pay

## 2019-10-17 ENCOUNTER — Ambulatory Visit (INDEPENDENT_AMBULATORY_CARE_PROVIDER_SITE_OTHER): Payer: Medicare Other | Admitting: Obstetrics

## 2019-10-17 ENCOUNTER — Encounter: Payer: Self-pay | Admitting: Obstetrics

## 2019-10-17 VITALS — BP 110/70 | Ht 60.0 in | Wt 216.0 lb

## 2019-10-17 DIAGNOSIS — N898 Other specified noninflammatory disorders of vagina: Secondary | ICD-10-CM | POA: Diagnosis not present

## 2019-10-17 DIAGNOSIS — B373 Candidiasis of vulva and vagina: Secondary | ICD-10-CM | POA: Diagnosis not present

## 2019-10-17 DIAGNOSIS — B3731 Acute candidiasis of vulva and vagina: Secondary | ICD-10-CM

## 2019-10-17 LAB — POCT WET PREP (WET MOUNT): Trichomonas Wet Prep HPF POC: ABSENT

## 2019-10-17 LAB — CERVICOVAGINAL ANCILLARY ONLY
Bacterial Vaginitis (gardnerella): NEGATIVE
Candida Glabrata: NEGATIVE
Candida Vaginitis: POSITIVE — AB
Chlamydia: NEGATIVE
Comment: NEGATIVE
Comment: NEGATIVE
Comment: NEGATIVE
Comment: NEGATIVE
Comment: NEGATIVE
Comment: NORMAL
Neisseria Gonorrhea: NEGATIVE
Trichomonas: NEGATIVE

## 2019-10-17 MED ORDER — FLUCONAZOLE 150 MG PO TABS: 150.0000 mg | ORAL_TABLET | ORAL | 5 refills | Status: AC

## 2019-10-17 MED ORDER — TERCONAZOLE 0.4 % VA CREA: TOPICAL_CREAM | VAGINAL | 0 refills | Status: DC

## 2019-10-17 NOTE — Telephone Encounter (Signed)
-----   Message from Mar Daring, [place] sent at 10/17/2019 10:24 AM EDT ----- Urine culture is positive for e.coli. It is sensitive to cephalexin. Continue until completed.

## 2019-10-17 NOTE — Progress Notes (Signed)
Obstetrics & Gynecology Office Visit   Chief Complaint:  Chief Complaint  Patient presents with  . Vaginitis    History of Present Illness: Alexis Lloyd presents as a new patient for a problem visit.She recently saw her PCP for a recurring complaint of yeast vaginitis. Per this patient, she struggles with recurrent yeast, secondary to somewhat uncontrolled diabetes. She readily admits to eating many sugary foods, including cakes and candy as well as regular soda. Four days ago she saw her PCP and was prescribed Diflucan (x3 tabs), as well as terazol cream. She completed the oral meds, and has used the cream, but today reports a tender area on her lower right labia majora that she believes is a result of her scratching. She requests we check this sore area and recheck her for yeast.   Review of Systems:  ROS   Past Medical History:  Past Medical History:  Diagnosis Date  . Allergy   . Arthritis   . Breast cancer ([place])   . Breast cancer, left breast ([place]) 10/04/2014  . Diabetes mellitus without complication ([place])   . GERD (gastroesophageal reflux disease)   . Hyperlipidemia   . Hypertension   . Osteoporosis   . Sleep apnea     Past Surgical History:  Past Surgical History:  Procedure Laterality Date  . ABDOMINAL HYSTERECTOMY     due to fibroid tumors 1990 Dr. Quenten Raven  . BREAST BIOPSY Left    negative  . BREAST BIOPSY Left    04/2013 positive  . BREAST CYST ASPIRATION Left   . BREAST EXCISIONAL BIOPSY Left    2015  . BREAST LUMPECTOMY Left 06/17/2013  . BREAST REDUCTION SURGERY Bilateral   . CESAREAN SECTION     x's 2  . CHOLECYSTECTOMY  09/2001  . COLONOSCOPY WITH PROPOFOL N/A 09/11/2017   Procedure: COLONOSCOPY WITH PROPOFOL;  Surgeon: Virgel Manifold, MD;  Location: [facility] ENDOSCOPY;  Service: Endoscopy;  Laterality: N/A;  . ESOPHAGOGASTRODUODENOSCOPY (EGD) WITH PROPOFOL N/A 09/11/2017   Procedure: ESOPHAGOGASTRODUODENOSCOPY (EGD) WITH PROPOFOL;  Surgeon: Virgel Manifold, MD;  Location: [facility] ENDOSCOPY;  Service: Endoscopy;  Laterality: N/A;  . REDUCTION MAMMAPLASTY    . s/p radiation therapy Left    2015    Gynecologic History: No LMP recorded. Patient has had a hysterectomy.  Obstetric History: [id number]  Family History:  Family History  Problem Relation Age of Onset  . Hypertension Mother   . Cancer Mother        breast and rectal cancer  . Breast cancer Mother 69  . Asthma Brother   . Hypertension Brother   . Allergic rhinitis Brother   . Breast cancer Maternal Grandmother     Social History:  Social History   Socioeconomic History  . Marital status: Married    Spouse name: Elberta Fortis  . Number of children: 2  . Years of education: some college  . Highest education level: Not on file  Occupational History    Employer: DISABLED  Tobacco Use  . Smoking status: Former Smoker    Packs/day: 0.25    Years: 21.00    Pack years: 5.25    Quit date: 06/08/1997    Years since quitting: 22.3  . Smokeless tobacco: Never Used  Substance and Sexual Activity  . Alcohol use: No  . Drug use: No  . Sexual activity: Yes    Birth control/protection: Surgical  Other Topics Concern  . Not on file  Social History Narrative   Left  handed   Lives in a 2 story home with husband   Social Determinants of Health   Financial Resource Strain:   . Difficulty of Paying Living Expenses:   Food Insecurity:   . Worried About Charity fundraiser in the Last Year:   . Arboriculturist in the Last Year:   Transportation Needs:   . Film/video editor (Medical):   Marland Kitchen Lack of Transportation (Non-Medical):   Physical Activity:   . Days of Exercise per Week:   . Minutes of Exercise per Session:   Stress:   . Feeling of Stress :   Social Connections:   . Frequency of Communication with Friends and Family:   . Frequency of Social Gatherings with Friends and Family:   . Attends Religious Services:   . Active Member of Clubs or Organizations:   .  Attends Archivist Meetings:   Marland Kitchen Marital Status:   Intimate Partner Violence:   . Fear of Current or Ex-Partner:   . Emotionally Abused:   Marland Kitchen Physically Abused:   . Sexually Abused:     Allergies:  Allergies  Allergen Reactions  . Allegra [Fexofenadine] Nausea And Vomiting  . Avelox [Moxifloxacin] Other (See Comments)    Hallucinations  . Bactrim [Sulfamethoxazole-Trimethoprim] Other (See Comments)    unknown  . Etodolac Nausea Only  . Fish-Derived Products Nausea And Vomiting    With "seafood"  . Penicillins Diarrhea and Nausea And Vomiting  . Shellfish Allergy Nausea Only  . Sulfa Antibiotics Other (See Comments)    unknown  . Macrobid [employer] Macro] Other (See Comments)    Constipation and globus hystericus.    Medications: Prior to Admission medications   Medication Sig Start Date End Date Taking? Authorizing Provider  albuterol (PROVENTIL HFA) 108 (90 Base) MCG/ACT inhaler Inhale 2 puffs every 6 (six) hours as needed into the lungs. 04/13/17  Yes Mar Daring, PA-C  Blood Glucose Monitoring Suppl [facility] VERIO) w/Device KIT To check blood sugar once daily 01/05/19  Yes Burnette, Clearnce Sorrel, PA-C  cephALEXin (KEFLEX) 500 MG capsule Take 1 capsule (500 mg total) by mouth 2 (two) times daily. 10/14/19  Yes Mar Daring, PA-C  cetirizine (ZYRTEC) 10 MG tablet Take 1 tablet (10 mg total) by mouth daily. 07/24/15  Yes Margarita Rana, MD  glucose blood [facility] VERIO) test strip To check blood sugar once daily 01/05/19  Yes Burnette, Anderson [place] M, PA-C  ketoconazole (NIZORAL) 2 % cream APPLY CREAM TOPICALLY TWICE DAILY TO FEET 02/23/18  Yes [provider]  Lancets (ONETOUCH ULTRASOFT) lancets USE LANCET(S)  TO CHECK GLUCOSE DAILY 01/05/19  Yes Fenton Malling M, PA-C  lisinopril-hydrochlorothiazide (ZESTORETIC) 20-12.5 MG tablet TAKE 2 TABLETS BY MOUTH  DAILY 03/15/19  Yes Fenton Malling M, PA-C  metFORMIN (GLUCOPHAGE) 1000 MG  tablet TAKE 1 TABLET BY MOUTH  TWICE DAILY WITH A MEAL 03/15/19  Yes Fenton Malling M, PA-C  montelukast (SINGULAIR) 10 MG tablet Take 1 tablet (10 mg total) by mouth daily. 11/09/18  Yes Mar Daring, PA-C  sitaGLIPtin (JANUVIA) 100 MG tablet Take 1 tablet (100 mg total) by mouth daily. 08/25/18  Yes Mar Daring, PA-C  spironolactone (ALDACTONE) 25 MG tablet TAKE 1 TABLET BY MOUTH  DAILY 03/15/19  Yes Mar Daring, PA-C  SUMAtriptan (IMITREX) 50 MG tablet Take 1 tablet (50 mg total) by mouth every 2 (two) hours as needed for migraine. No more than 238m in 24 hours 07/07/19  Yes Burnette,  Clearnce Sorrel, PA-C  terconazole (TERAZOL 7) 0.4 % vaginal cream Apply topically to external vaginal tissue at bedtime for itching 10/17/19  Yes Imagene Riches, CNM  verapamil (CALAN) 80 MG tablet TAKE 1 TABLET BY MOUTH  TWICE DAILY 08/01/19  Yes Fenton Malling M, PA-C  fluconazole (DIFLUCAN) 150 MG tablet Take 1 tablet (150 mg total) by mouth once a week for 4 doses. 10/17/19 11/08/19  Imagene Riches, CNM  glipiZIDE (GLUCOTROL) 10 MG tablet Take 1 tablet (10 mg total) by mouth 2 (two) times daily before a meal. Patient not taking: Reported on 10/17/2019 10/14/19   Mar Daring, PA-C    Physical Exam Vitals:  Vitals:   10/17/19 1000  BP: 110/70   No LMP recorded. Patient has had a hysterectomy.  Physical Exam  Pelvic exam: Normal hair distribution, rashes noted. Small 37m laceration noted near base of right labia- no drainage or bleeding noted. No lesions noted.  [place]et [place]:   + hyphae seen. No clue cells, trich, negative whiff . Assessment: 59y.o. [id number] problem-specific Assessment & Plan notes found for this encounter.   Plan: Problem List Items Addressed This Visit    None    Visit Diagnoses    Yeast vaginitis    -  Primary   Relevant Medications   fluconazole (DIFLUCAN) 150 MG tablet   Vaginal discharge       Relevant Medications   fluconazole (DIFLUCAN)  150 MG tablet   Vaginal itching       Relevant Medications   terconazole (TERAZOL 7) 0.4 % vaginal cream     She is placed on suppressive Diflucan- one tab weekly for four weeks. Rx for Terezol sent to her pharmacy. Warm moist soaks to the small lacerated area advised BID this week. Complete the Keflex Rx her PCP provided. We discussed the relationship between her recurrent yeast and her less than well controlled diabetes. Suggested she ask for a referral to a diabetic counselor or nutritionist from her PCP.

## 2019-10-17 NOTE — Telephone Encounter (Signed)
LMTCB 10/17/2019.  PEC please advise pt as below.   Thanks,    -Mickel Baas

## 2019-10-17 NOTE — Telephone Encounter (Signed)
Called patient and discussed [facility] appt.

## 2019-10-17 NOTE — Telephone Encounter (Signed)
LMTCB 10/17/2019.  PEC Please advise pt of urine results.   Thanks,   -Mickel Baas

## 2019-10-17 NOTE — Patient Instructions (Signed)
Vaginal Yeast Infection, Adult  Vaginal yeast infection is a condition that causes vaginal discharge as well as soreness, swelling, and redness (inflammation) of the vagina. This is a common condition. Some women get this infection frequently. What are the causes? This condition is caused by a change in the normal balance of the yeast (candida) and bacteria that live in the vagina. This change causes an overgrowth of yeast, which causes the inflammation. What increases the risk? The condition is more likely to develop in women who:  Take antibiotic medicines.  Have diabetes.  Take birth control pills.  Are pregnant.  Douche often.  Have a weak body defense system (immune system).  Have been taking steroid medicines for a long time.  Frequently wear tight clothing. What are the signs or symptoms? Symptoms of this condition include:  White, thick, creamy vaginal discharge.  Swelling, itching, redness, and irritation of the vagina. The lips of the vagina (vulva) may be affected as well.  Pain or a burning feeling while urinating.  Pain during sex. How is this diagnosed? This condition is diagnosed based on:  Your medical history.  A physical exam.  A pelvic exam. Your health care provider will examine a sample of your vaginal discharge under a microscope. Your health care provider may send this sample for testing to confirm the diagnosis. How is this treated? This condition is treated with medicine. Medicines may be over-the-counter or prescription. You may be told to use one or more of the following:  Medicine that is taken by mouth (orally).  Medicine that is applied as a cream (topically).  Medicine that is inserted directly into the vagina (suppository). Follow these instructions at home:  Lifestyle  Do not have sex until your health care provider approves. Tell your sex partner that you have a yeast infection. That person should go to his or her health care  provider and ask if they should also be treated.  Do not wear tight clothes, such as pantyhose or tight pants.  Wear breathable cotton underwear. General instructions  Take or apply over-the-counter and prescription medicines only as told by your health care provider.  Eat more yogurt. This may help to keep your yeast infection from returning.  Do not use tampons until your health care provider approves.  Try taking a sitz bath to help with discomfort. This is a warm water bath that is taken while you are sitting down. The water should only come up to your hips and should cover your buttocks. Do this 3-4 times per day or as told by your health care provider.  Do not douche.  If you have diabetes, keep your blood sugar levels under control.  Keep all follow-up visits as told by your health care provider. This is important. Contact a health care provider if:  You have a fever.  Your symptoms go away and then return.  Your symptoms do not get better with treatment.  Your symptoms get worse.  You have new symptoms.  You develop blisters in or around your vagina.  You have blood coming from your vagina and it is not your menstrual period.  You develop pain in your abdomen. Summary  Vaginal yeast infection is a condition that causes discharge as well as soreness, swelling, and redness (inflammation) of the vagina.  This condition is treated with medicine. Medicines may be over-the-counter or prescription.  Take or apply over-the-counter and prescription medicines only as told by your health care provider.  Do not douche.  Do not have sex or use tampons until your health care provider approves.  Contact a health care provider if your symptoms do not get better with treatment or your symptoms go away and then return. This information is not intended to replace advice given to you by your health care provider. Make sure you discuss any questions you have with your health care  provider. Document Revised: 12/24/2018 Document Reviewed: 10/12/2017 Elsevier Patient Education  2020 [employer]vier Inc.  

## 2019-10-17 NOTE — Telephone Encounter (Signed)
Copied from [place] [id number]. Topic: General - Call Back - No Documentation >> Oct 17, 2019 10:58 AM Erick Blinks wrote: Reason for CRM: Pt called requesting a call back from PCP to discuss what she learned today at the [facility]. Please advise, pt declined appt and states that PCP told her to just call the office with this information Best contact: [phone] (VM available)

## 2019-10-17 NOTE — Telephone Encounter (Signed)
-----   Message from Mar Daring, [place] sent at 10/17/2019 12:51 PM EDT ----- Vaginal swab was completely negative for everything except yeast as expected.

## 2019-10-18 ENCOUNTER — Ambulatory Visit: Payer: Medicare Other | Admitting: Podiatry

## 2019-10-18 NOTE — Telephone Encounter (Signed)
Attempted to call pt.  Left vm to return call to office to discuss lab results.

## 2019-10-24 ENCOUNTER — Ambulatory Visit: Payer: Medicare Other | Admitting: Podiatry

## 2019-10-24 ENCOUNTER — Encounter: Payer: Self-pay | Admitting: Podiatry

## 2019-10-24 ENCOUNTER — Other Ambulatory Visit: Payer: Self-pay

## 2019-10-24 DIAGNOSIS — L989 Disorder of the skin and subcutaneous tissue, unspecified: Secondary | ICD-10-CM

## 2019-10-24 DIAGNOSIS — E1143 Type 2 diabetes mellitus with diabetic autonomic (poly)neuropathy: Secondary | ICD-10-CM

## 2019-10-24 DIAGNOSIS — B351 Tinea unguium: Secondary | ICD-10-CM

## 2019-10-24 DIAGNOSIS — M79675 Pain in left toe(s): Secondary | ICD-10-CM | POA: Diagnosis not present

## 2019-10-24 DIAGNOSIS — M79674 Pain in right toe(s): Secondary | ICD-10-CM | POA: Diagnosis not present

## 2019-10-24 NOTE — Progress Notes (Signed)
This patient presents to the office for an evaluation of her painful big toe right foot as well as requesting an evaluation of multiple skin lesions on the inside and outside of her right ankle.  She says she has pain on the inside border the big toe of the right foot which is painful during walking.  She states she previously had nail surgery for the permanent removal of this nail and had significant screaming episodes.  Patient also says that she has had the reformation of skin lesions on the inside and outside of her right ankle.  She says these areas are very itchy.  She says she was seen by the dermatologist who prescribed medication to treat these lesions but was ineffective.  She presents the office today for an evaluation and treatment of her feet.  This patient is diabetic and her hemoglobin A1C is over 12.    General Appearance  Alert, conversant and in no acute stress.  Vascular  Dorsalis pedis and posterior tibial  pulses are palpable  bilaterally.  Capillary return is within normal limits  bilaterally. Temperature is within normal limits  bilaterally.  Neurologic  Senn-Weinstein monofilament wire test within normal limits  bilaterally. Muscle power within normal limits bilaterally.  Nails Thick disfigured discolored nails with subungual debris  Hallux nails  B/l.  Marked incurvation medial border right hallux.  No signs of redness or swelling or infection.. No evidence of bacterial infection or drainage bilaterally.  Orthopedic  No limitations of motion  feet .  No crepitus or effusions noted.  No bony pathology or digital deformities noted. Hallux limitus 1st MPJ  B/L.  Skin  normotropic skin with no porokeratosis noted bilaterally.  Multiple elevated skin lesions which appear verrucous in nature.    Ingrown toenail hallux nail right foot.  Benign skin lesions ankle  B/L  IE.  Debride ingrown nail with nail nipper followed by dremel use.  Discussed the skin lesions with this patient.   Discussed taking a skin biopsy and sending the lesion to the lab.  She chose against this and has decided to utilize cortisone cream and check its  effects on the skin lesion.  Told this patient she should get the paperwork of the visit to the dermatologist for my records.  Patient to return to the office in 3 months for continued evaluation and treatment of the painful ingrowing toenail right hallux.   Gardiner Barefoot DPM

## 2019-10-28 ENCOUNTER — Encounter: Payer: Self-pay | Admitting: *Deleted

## 2019-10-28 ENCOUNTER — Other Ambulatory Visit: Payer: Self-pay

## 2019-10-28 ENCOUNTER — Encounter: Payer: Medicare Other | Attending: Physician Assistant | Admitting: *Deleted

## 2019-10-28 VITALS — BP 122/74 | Ht 59.0 in | Wt 215.1 lb

## 2019-10-28 DIAGNOSIS — E1165 Type 2 diabetes mellitus with hyperglycemia: Secondary | ICD-10-CM | POA: Diagnosis not present

## 2019-10-28 DIAGNOSIS — E1140 Type 2 diabetes mellitus with diabetic neuropathy, unspecified: Secondary | ICD-10-CM | POA: Insufficient documentation

## 2019-10-28 NOTE — Progress Notes (Signed)
Diabetes Self-Management Education  Visit Type: First/Initial  Appt. Start Time: 1335 Appt. End Time: [id number]  10/28/2019  [name], identified by name and date of birth, is a 59 y.o. female with a diagnosis of Diabetes: Type 2.   ASSESSMENT  Blood pressure 122/74, height 4\' 11"  (1.499 m), weight 215 lb 1.6 oz (97.6 kg). Body mass index is 43.44 kg/m.  Diabetes Self-Management Education - 10/28/19 1501      Visit Information   Visit Type  First/Initial      Initial Visit   Diabetes Type  Type 2    Are you currently following a meal plan?  No    Are you taking your medications as prescribed?  No   She is not taking Januvia. Reports that this increases her blood sugar and she will discuss with PCP.   Date Diagnosed  5 years ago      Health Coping   How would you rate your overall health?  Good      Psychosocial Assessment   Patient Belief/Attitude about Diabetes  Motivated to manage diabetes    Self-care barriers  None    Self-management support  Doctor's office    Patient Concerns  Nutrition/Meal planning;Medication;Monitoring;Healthy Lifestyle;Problem Solving;Glycemic Control;Weight Control    Special Needs  None    Preferred Learning Style  Hands on    Learning Readiness  Ready    How often do you need to have someone help you when you read instructions, pamphlets, or other written materials from your doctor or pharmacy?  1 - Never    What is the last grade level you completed in school?  some college      Pre-Education Assessment   Patient understands the diabetes disease and treatment process.  Needs Review    Patient understands incorporating nutritional management into lifestyle.  Needs Instruction    Patient undertands incorporating physical activity into lifestyle.  Needs Review    Patient understands using medications safely.  Needs Instruction    Patient understands monitoring blood glucose, interpreting and using results  Needs Review    Patient  understands prevention, detection, and treatment of acute complications.  Needs Review    Patient understands prevention, detection, and treatment of chronic complications.  Needs Review    Patient understands how to develop strategies to address psychosocial issues.  Needs Review    Patient understands how to develop strategies to promote health/change behavior.  Needs Review      Complications   Last HgB A1C per patient/outside source  12.3 %   10/14/2019   How often do you check your blood sugar?  3-4 times/day    Fasting Blood glucose range (mg/dL)  180-200   Pt reports FBG's of 180-190's mg/dL.   Postprandial Blood glucose range (mg/dL)  130-179;180-200;>200   Pt reports pp's 148-300 mg/dL. Bedtime readings 150's mg/dL.   Number of hypoglycemic episodes per month  --   Pt has glucose tablets and peppermints with her.   Have you had a dilated eye exam in the past 12 months?  Yes    Have you had a dental exam in the past 12 months?  Yes    Are you checking your feet?  Yes    How many days per week are you checking your feet?  4      Dietary Intake   Breakfast  yogurt, cereal with pecans, milk; sausage, egg, toast and yogurt    Lunch  left overs - meat and vegetables  and fruit (water melon, pineapple, pears)    Dinner  pork chops, chicken, beef, 2 strips bacon; beans, green beans, greens, onions, tomatoes, cuccumbers, squash, okra, broccoli, cauliflower - occasional peas and corn    Beverage(s)  water, juice, diet soda, coffee      Exercise   Exercise Type  Light (walking / raking leaves)    How many days per week to you exercise?  5    How many minutes per day do you exercise?  30    Total minutes per week of exercise  150      Patient Education   Previous Diabetes Education  Yes (please comment)    Disease state   Factors that contribute to the development of diabetes;Explored patient's options for treatment of their diabetes    Nutrition management   Role of diet in the  treatment of diabetes and the relationship between the three main macronutrients and blood glucose level;Food label reading, portion sizes and measuring food.;Reviewed blood glucose goals for pre and post meals and how to evaluate the patients' food intake on their blood glucose level.;Meal timing in regards to the patients' current diabetes medication.    Physical activity and exercise   Role of exercise on diabetes management, blood pressure control and cardiac health.    Medications  Reviewed patients medication for diabetes, action, purpose, timing of dose and side effects.    Monitoring  Purpose and frequency of SMBG.;Taught/discussed recording of test results and interpretation of SMBG.;Identified appropriate SMBG and/or A1C goals.    Acute complications  Taught treatment of hypoglycemia - the 15 rule.    Chronic complications  Relationship between chronic complications and blood glucose control    Psychosocial adjustment  Identified and addressed patients feelings and concerns about diabetes      Individualized Goals (developed by patient)   Reducing Risk  Other (comment)   improve blood sugars, decrease medications, prevent diabetes complications, lose weight, lead a healthier lifestyle, become more fit     Outcomes   Expected Outcomes  Demonstrated interest in learning. Expect positive outcomes    Future DMSE  2 wks       Individualized Plan for Diabetes Self-Management Training:   Learning Objective:  Patient will have a greater understanding of diabetes self-management. Patient education plan is to attend individual and/or group sessions per assessed needs and concerns.   Plan:   Patient Instructions  Check blood sugars 2 x day before breakfast and 2 hrs after one every day Bring blood sugar records to the next appointment Exercise: Continue walking for  30  minutes  5 days a week Eat 3 meals day,  1-2  snacks a day Space meals 4-6 hours apart Avoid sugar sweetened drinks  (juices) unless treating a low blood sugar Complete 3 Day Food Record and bring to next appt Carry fast acting glucose and a snack at all times Return for appointment on:  Thursday June 10 at 1:30 pm with Reanna (dietitian)  Expected Outcomes:  Demonstrated interest in learning. Expect positive outcomes  Education material provided:  General Meal Planning Guidelines Simple Meal Plan 3 Day Food Record Symptoms, causes and treatments of Hypoglycemia  If problems or questions, patient to contact team via:  Johny Drilling, RN, [place] [phone]  Future DSME appointment: 2 wks  November 17, 2019 with the dietitian

## 2019-10-28 NOTE — Patient Instructions (Signed)
Check blood sugars 2 x day before breakfast and 2 hrs after one every day Bring blood sugar records to the next appointment  Exercise: Continue walking for  30  minutes  5 days a week  Eat 3 meals day,  1-2  snacks a day Space meals 4-6 hours apart Avoid sugar sweetened drinks (juices) unless treating a low blood sugar  Complete 3 Day Food Record and bring to next appt  Carry fast acting glucose and a snack at all times  Return for appointment on:  Thursday June 10 at 1:30 pm with Reanna (dietitian)

## 2019-11-01 ENCOUNTER — Ambulatory Visit: Payer: Medicare Other | Admitting: Dietician

## 2019-11-04 ENCOUNTER — Telehealth: Payer: Medicare Other | Admitting: Physician Assistant

## 2019-11-10 ENCOUNTER — Ambulatory Visit: Payer: Medicare Other | Admitting: Physician Assistant

## 2019-11-15 ENCOUNTER — Other Ambulatory Visit: Payer: Self-pay | Admitting: Physician Assistant

## 2019-11-15 DIAGNOSIS — J40 Bronchitis, not specified as acute or chronic: Secondary | ICD-10-CM

## 2019-11-15 NOTE — Telephone Encounter (Signed)
Requested Prescriptions  Pending Prescriptions Disp Refills  . montelukast (SINGULAIR) 10 MG tablet [Pharmacy Med Name: MONTELUKAST 10MG  TABLET] 90 tablet 3    Sig: TAKE 1 TABLET BY MOUTH  DAILY     Pulmonology:  Leukotriene Inhibitors Passed - 11/15/2019  1:23 PM      Passed - Valid encounter within last 12 months    Recent Outpatient Visits          1 month ago Vaginal discharge   [place], Clearnce Sorrel, [place]   1 month ago Vaginal itching   [place], Clearnce Sorrel, [place]   2 months ago Vaginal discharge   [place], [place]   3 months ago Cystitis with hematuria   [facility] [place], Dionne Bucy, [place]   4 months ago Primary stabbing headache   [place], Clearnce Sorrel, [place]      Future Appointments            In 1 month Ralene Bathe, MD [place]   In 2 months [place], Clearnce Sorrel, PA-C [employer], [place]

## 2019-11-17 ENCOUNTER — Ambulatory Visit: Payer: Medicare Other | Admitting: Dietician

## 2019-11-24 ENCOUNTER — Encounter: Payer: Medicare Other | Attending: Physician Assistant | Admitting: Dietician

## 2019-11-24 ENCOUNTER — Other Ambulatory Visit: Payer: Self-pay

## 2019-11-24 VITALS — Ht 59.0 in | Wt 217.8 lb

## 2019-11-24 DIAGNOSIS — E1140 Type 2 diabetes mellitus with diabetic neuropathy, unspecified: Secondary | ICD-10-CM | POA: Insufficient documentation

## 2019-11-24 DIAGNOSIS — E1165 Type 2 diabetes mellitus with hyperglycemia: Secondary | ICD-10-CM | POA: Diagnosis not present

## 2019-11-24 NOTE — Patient Instructions (Signed)
   Cut back on sweetened coffee beverages   Start using measuring cups to portion control carb-rich foods  Keep up the great work with the physical activity!!

## 2019-11-24 NOTE — Progress Notes (Signed)
Diabetes Self-Management Education  Visit Type: Follow-up  Appt. Start Time: 1330 Appt. End Time: 1430  11/24/2019  [name], identified by name and date of birth, is a 59 y.o. female with a diagnosis of Diabetes: Type 2.   ASSESSMENT  Height 4\' 11"  (1.499 m), weight 217 lb 12.8 oz (98.8 kg). Body mass index is 43.99 kg/m.   Diabetes Self-Management Education - 11/24/19 1331      Visit Information   Visit Type Follow-up      Initial Visit   Diabetes Type Type 2      Complications   How often do you check your blood sugar? 3-4 times/day    Fasting Blood glucose range (mg/dL) 130-179   163   Number of hypoglycemic episodes per month 1   69   Can you tell when your blood sugar is low? Yes    What do you do if your blood sugar is low? felt shaky and woozy    Have you had a dilated eye exam in the past 12 months? Yes    Have you had a dental exam in the past 12 months? Yes    Are you checking your feet? Yes    How many days per week are you checking your feet? 4      Dietary Intake   Breakfast SF oatmeal, sausage or 3 slices of bacon with eggs; bowl of cereal with sliced banana    Lunch leftovers: chicken with baked potato with greens; takeout hambuger with fries    Dinner [place], chicken, steak with salad or cabbage or spinach or cucumber salad with [place] dressings    Snack (evening) popcorn, SF cookies, sherbert    Beverage(s) water, no more juice,       Exercise   Exercise Type Light (walking / raking leaves)    How many days per week to you exercise? 5    How many minutes per day do you exercise? 30    Total minutes per week of exercise 150      Post-Education Assessment   Patient understands the diabetes disease and treatment process. Demonstrates understanding / competency    Patient understands incorporating nutritional management into lifestyle. Demonstrates understanding / competency    Patient undertands incorporating physical activity into lifestyle.  Demonstrates understanding / competency    Patient understands using medications safely. Demonstrates understanding / competency    Patient understands monitoring blood glucose, interpreting and using results Demonstrates understanding / competency    Patient understands prevention, detection, and treatment of acute complications. Demonstrates understanding / competency (P)     Patient understands prevention, detection, and treatment of chronic complications. Demonstrates understanding / competency (P)     Patient understands how to develop strategies to address psychosocial issues. Demonstrates understanding / competency (P)     Patient understands how to develop strategies to promote health/change behavior. Demonstrates understanding / competency (P)       Outcomes   Expected Outcomes Demonstrated interest in learning. Expect positive outcomes (P)     Future DMSE PRN (P)     Program Status Completed (P)            Individualized Plan for Diabetes Self-Management Training:   Learning Objective:  Patient will have a greater understanding of diabetes self-management. Patient education plan is to attend individual and/or group sessions per assessed needs and concerns.   Plan: Reviewed portion sizes with patient and how to portion control carbohydrate-rich foods. Answered patient's questions about desserts  and sweets. Provided pt with sample meal ideas that are carb controlled   Patient Instructions   Cut back on sweetened coffee beverages   Start using measuring cups to portion control carb-rich foods  Keep up the great work with the physical activity!!   Expected Outcomes:  (P) Demonstrated interest in learning. Expect positive outcomes  Education material provided: A1C conversion sheet, Carbohydrate counting sheet and Diabetes Resources, Planning a Balanced Meal  If problems or questions, patient to contact team via:  Phone and Email  Future DSME appointment: (P) PRN

## 2019-11-25 ENCOUNTER — Other Ambulatory Visit: Payer: Self-pay | Admitting: Physician Assistant

## 2019-11-25 MED ORDER — OMEPRAZOLE 20 MG PO CPDR
20.0000 mg | DELAYED_RELEASE_CAPSULE | Freq: Every day | ORAL | 3 refills | Status: DC
Start: 2019-11-25 — End: 2020-02-09

## 2019-11-25 NOTE — Telephone Encounter (Signed)
Medication Refill - Medication: omeprazole (PRILOSEC) 20 MG capsule   Has the patient contacted their pharmacy? No. (Agent: If no, request that the patient contact the pharmacy for the refill.) (Agent: If yes, when and what did the pharmacy advise?)  Preferred Pharmacy (with phone number or street name):  [facility], [address], [address] Phone:  [phone]  Fax:  [phone]       Agent: Please be advised that RX refills may take up to 3 business days. We ask that you follow-up with your pharmacy.

## 2019-11-25 NOTE — Telephone Encounter (Signed)
I don't see this on her med list.  Is it okay to order?  Thanks,   -Mickel Baas

## 2019-11-29 DIAGNOSIS — R921 Mammographic calcification found on diagnostic imaging of breast: Secondary | ICD-10-CM | POA: Diagnosis not present

## 2019-11-29 DIAGNOSIS — Z86000 Personal history of in-situ neoplasm of breast: Secondary | ICD-10-CM | POA: Diagnosis not present

## 2019-11-29 DIAGNOSIS — K219 Gastro-esophageal reflux disease without esophagitis: Secondary | ICD-10-CM | POA: Diagnosis not present

## 2019-11-29 DIAGNOSIS — G4733 Obstructive sleep apnea (adult) (pediatric): Secondary | ICD-10-CM | POA: Diagnosis not present

## 2019-11-29 DIAGNOSIS — E1165 Type 2 diabetes mellitus with hyperglycemia: Secondary | ICD-10-CM | POA: Diagnosis not present

## 2019-11-29 DIAGNOSIS — D0512 Intraductal carcinoma in situ of left breast: Secondary | ICD-10-CM | POA: Diagnosis not present

## 2019-11-29 DIAGNOSIS — D508 Other iron deficiency anemias: Secondary | ICD-10-CM | POA: Diagnosis not present

## 2019-11-29 DIAGNOSIS — I10 Essential (primary) hypertension: Secondary | ICD-10-CM | POA: Diagnosis not present

## 2019-11-29 DIAGNOSIS — E1140 Type 2 diabetes mellitus with diabetic neuropathy, unspecified: Secondary | ICD-10-CM | POA: Diagnosis not present

## 2019-12-14 DIAGNOSIS — I10 Essential (primary) hypertension: Secondary | ICD-10-CM | POA: Diagnosis not present

## 2019-12-14 DIAGNOSIS — E782 Mixed hyperlipidemia: Secondary | ICD-10-CM | POA: Diagnosis not present

## 2019-12-14 DIAGNOSIS — E1165 Type 2 diabetes mellitus with hyperglycemia: Secondary | ICD-10-CM | POA: Diagnosis not present

## 2019-12-14 DIAGNOSIS — E1142 Type 2 diabetes mellitus with diabetic polyneuropathy: Secondary | ICD-10-CM | POA: Diagnosis not present

## 2019-12-20 ENCOUNTER — Other Ambulatory Visit: Payer: Self-pay

## 2019-12-20 ENCOUNTER — Ambulatory Visit: Payer: Medicare Other | Admitting: Dermatology

## 2019-12-20 DIAGNOSIS — L820 Inflamed seborrheic keratosis: Secondary | ICD-10-CM

## 2019-12-20 DIAGNOSIS — L821 Other seborrheic keratosis: Secondary | ICD-10-CM

## 2019-12-20 NOTE — Progress Notes (Signed)
   Follow-Up Visit   Subjective  Alexis Lloyd is a 59 y.o. female who presents for the following: Other (Spots on bilateral ankles x ~1 year. They are itchy sometimes.).  The following portions of the chart were reviewed this encounter and updated as appropriate:  Tobacco  Allergies  Meds  Problems  Med Hx  Surg Hx  Fam Hx     Review of Systems:  No other skin or systemic complaints except as noted in HPI or Assessment and Plan.  Objective  Well appearing patient in no apparent distress; mood and affect are within normal limits.  A focused examination was performed including bilateral ankles. Relevant physical exam findings are noted in the Assessment and Plan.  Objective  Left Ankle - Anterior, Right Ankle - Anterior: Stuck-on, waxy, tan-brown papule or plaque --Discussed benign etiology and prognosis.   Objective  Right Ankle - Anterior: Erythematous keratotic or waxy stuck-on papule or plaque.    Assessment & Plan    Seborrheic keratosis (2) Left Ankle - Anterior; Right Ankle - Anterior  Discussed condition and treatment options - Ln2 (not recommended) vs ED.   Inflamed seborrheic keratosis Right Ankle - Anterior  One lesion of right ankle treated today. May plan to treat others in the future if she has a good result.  Destruction of lesion - Right Ankle - Anterior Complexity: simple   Destruction method comment:  Electrodessication Informed consent: discussed and consent obtained   Timeout:  patient name, date of birth, surgical site, and procedure verified Outcome: patient tolerated procedure well with no complications   Post-procedure details: wound care instructions given   Additional details:  1 lesion of right ankle treated today  Return if symptoms worsen or fail to improve.  I, Ashok Cordia, CMA, am acting as scribe for Sarina Ser, MD .  Documentation: I have reviewed the above documentation for accuracy and completeness, and I agree with the  above.  Sarina Ser, MD

## 2019-12-20 NOTE — Patient Instructions (Signed)

## 2019-12-21 ENCOUNTER — Encounter: Payer: Self-pay | Admitting: Dermatology

## 2020-01-13 ENCOUNTER — Other Ambulatory Visit: Payer: Self-pay | Admitting: Physician Assistant

## 2020-01-13 DIAGNOSIS — IMO0002 Reserved for concepts with insufficient information to code with codable children: Secondary | ICD-10-CM

## 2020-01-13 DIAGNOSIS — E1165 Type 2 diabetes mellitus with hyperglycemia: Secondary | ICD-10-CM

## 2020-01-13 DIAGNOSIS — E1142 Type 2 diabetes mellitus with diabetic polyneuropathy: Secondary | ICD-10-CM

## 2020-01-13 DIAGNOSIS — E1140 Type 2 diabetes mellitus with diabetic neuropathy, unspecified: Secondary | ICD-10-CM

## 2020-01-13 MED ORDER — GLIPIZIDE 10 MG PO TABS: 10.0000 mg | ORAL_TABLET | Freq: Two times a day (BID) | ORAL | 1 refills | Status: DC

## 2020-01-13 MED ORDER — ONETOUCH VERIO VI STRP: ORAL_STRIP | 12 refills | Status: DC

## 2020-01-13 MED ORDER — ONETOUCH ULTRASOFT LANCETS MISC: 12 refills | Status: DC

## 2020-01-16 ENCOUNTER — Other Ambulatory Visit: Payer: Self-pay | Admitting: Physician Assistant

## 2020-01-16 DIAGNOSIS — IMO0002 Reserved for concepts with insufficient information to code with codable children: Secondary | ICD-10-CM

## 2020-01-16 DIAGNOSIS — E1140 Type 2 diabetes mellitus with diabetic neuropathy, unspecified: Secondary | ICD-10-CM

## 2020-01-16 DIAGNOSIS — E1165 Type 2 diabetes mellitus with hyperglycemia: Secondary | ICD-10-CM

## 2020-01-16 MED ORDER — ONETOUCH ULTRASOFT LANCETS MISC: 12 refills | Status: DC

## 2020-01-16 NOTE — Progress Notes (Signed)
Onetouch ultrasoft lancets sent in

## 2020-01-19 NOTE — Progress Notes (Signed)
Established patient visit   Patient: Alexis Lloyd   DOB: 11-07-1960   59 y.o. Female  MRN: [id number] Visit Date: 01/20/2020  Today's healthcare provider: Mar Daring, PA-C   Chief Complaint  Patient presents with  . Diabetes   Subjective    HPI  T2DM-3 month follow up - Management since then includes Add glipizide BID. Referral also placed for diabetic education and endocrinology for uncontrolled diabetes.  - Compliance: good - Checking BG at home: yes readings are fluctuating. 3 episodes of hypoglycemia - Diet: well-balanced - Exercise: walking, but not recently - eye exam: not UTD, aware to schedule with her eye doctor - denies symptoms of hypoglycemia, polyuria, polydipsia, numbness extremities, foot ulcers/trauma  Lab Results  Component Value Date   HGBA1C 8.8 (A) 01/20/2020   HGBA1C 12.3 (A) 10/14/2019   HGBA1C 8.7 (H) 07/07/2019     Patient Active Problem List   Diagnosis Date Noted  . Pain due to onychomycosis of toenails of both feet 10/24/2019  . Benign skin lesion 10/24/2019  . Recurrent UTI 01/22/2018  . Recurrent vaginitis 01/22/2018  . History of Helicobacter pylori infection 11/05/2017  . Intrinsic eczema 11/05/2017  . CLE (columnar lined esophagus)   . Stomach irritation   . Gastric polyp   . Problems with swallowing and mastication   . Heartburn   . Benign neoplasm of transverse colon   . Special screening for malignant neoplasms, colon   . Internal hemorrhoids   . Diverticulosis of large intestine without diverticulitis   . Warts of foot 06/30/2017  . Achilles tendon contracture 01/28/2017  . Hallux rigidus of left foot 01/28/2017  . Hallux rigidus, right foot 01/28/2017  . Pes planus 01/28/2017  . Posterior tibial tendinitis of left lower extremity 01/28/2017  . Posterior tibial tendinitis of right lower extremity 01/28/2017  . Asthma 09/03/2015  . Foot pain 07/16/2015  . DM type 2, uncontrolled, with neuropathy ([place])  07/13/2015  . Abnormal finding on mammography, microcalcification 04/25/2015  . Intraductal carcinoma of breast 04/24/2015  . Airway hyperreactivity 11/18/2014  . BMI 40.0-44.9, adult ([place]) 11/18/2014  . Breast cyst 11/18/2014  . Diabetic neuropathy ([place]) 11/18/2014  . GERD (gastroesophageal reflux disease) 11/18/2014  . 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 Hypercholesteremia 11/18/2014  . Hypertension 11/18/2014  . Arthritis, degenerative 11/18/2014  . Allergic rhinitis, seasonal 11/18/2014  . OSA (obstructive sleep apnea) 11/18/2014  . AC (acromioclavicular) arthritis 11/08/2014  . Personal history of breast cancer 10/04/2014  . Primary osteoarthritis of right knee 11/14/2013   Past Medical History:  Diagnosis Date  . Allergy   . Arthritis   . Breast cancer ([place])   . Breast cancer, left breast ([place]) 10/04/2014  . Diabetes mellitus without complication ([place])   . GERD (gastroesophageal reflux disease)   . Hyperlipidemia   . Hypertension   . Osteoporosis   . Sleep apnea        Medications: Outpatient Medications Prior to Visit  Medication Sig  . albuterol (PROVENTIL HFA) 108 (90 Base) MCG/ACT inhaler Inhale 2 puffs every 6 (six) hours as needed into the lungs.  . Blood Glucose Monitoring Suppl (ONETOUCH VERIO) w/Device KIT To check blood sugar once daily  . cetirizine (ZYRTEC) 10 MG tablet Take 1 tablet (10 mg total) by mouth daily.  Marland Kitchen glipiZIDE (GLUCOTROL) 10 MG tablet Take 1 tablet (10 mg total) by mouth 2 (two) times daily before a meal.  . glucose blood (ONETOUCH VERIO) test strip To check blood sugar  once daily  . ketoconazole (NIZORAL) 2 % cream APPLY CREAM TOPICALLY TWICE DAILY TO FEET  . Lancets (ONETOUCH ULTRASOFT) lancets To check BS once daily  . lisinopril-hydrochlorothiazide (ZESTORETIC) 20-12.5 MG tablet TAKE 2 TABLETS BY MOUTH  DAILY  . metFORMIN (GLUCOPHAGE) 1000 MG tablet TAKE 1 TABLET BY MOUTH  TWICE DAILY WITH A MEAL  . montelukast (SINGULAIR) 10 MG tablet  TAKE 1 TABLET BY MOUTH  DAILY  . omeprazole (PRILOSEC) 20 MG capsule Take 1 capsule (20 mg total) by mouth daily.  . sitaGLIPtin (JANUVIA) 100 MG tablet Take 1 tablet (100 mg total) by mouth daily.  Marland Kitchen spironolactone (ALDACTONE) 25 MG tablet TAKE 1 TABLET BY MOUTH  DAILY  . SUMAtriptan (IMITREX) 50 MG tablet Take 1 tablet (50 mg total) by mouth every 2 (two) hours as needed for migraine. No more than 231m in 24 hours  . terconazole (TERAZOL 7) 0.4 % vaginal cream Apply topically to external vaginal tissue at bedtime for itching  . verapamil (CALAN) 80 MG tablet TAKE 1 TABLET BY MOUTH  TWICE DAILY  . RYBELSUS 3 MG TABS Take 1 tablet by mouth every morning.   No facility-administered medications prior to visit.    Review of Systems  Constitutional: Negative.   Respiratory: Negative.   Cardiovascular: Negative.   Endocrine: Negative.   Musculoskeletal: Negative.     Last CBC Lab Results  Component Value Date   WBC 9.4 07/07/2019   HGB 11.9 07/07/2019   HCT 34.9 07/07/2019   MCV 85 07/07/2019   MCH 29.0 07/07/2019   RDW 13.0 07/07/2019   PLT 324 062/44/6950  Last metabolic panel Lab Results  Component Value Date   GLUCOSE 178 (H) 07/07/2019   NA 136 07/07/2019   K 4.0 07/07/2019   CL 95 (L) 07/07/2019   CO2 23 07/07/2019   BUN 14 07/07/2019   CREATININE 0.81 07/07/2019   GFRNONAA 79 07/07/2019   GFRAA 91 07/07/2019   CALCIUM 10.1 07/07/2019   PROT 6.9 09/16/2018   ALBUMIN 4.0 09/16/2018   LABGLOB 2.9 09/16/2018   AGRATIO 1.4 09/16/2018   BILITOT 0.4 09/16/2018   ALKPHOS 82 09/16/2018   AST 7 09/16/2018   ALT 19 09/16/2018   ANIONGAP 8 02/21/2018      Objective    BP (!) 141/66 (BP Location: Right Arm, Patient Position: Sitting, Cuff Size: Large)   Pulse 63   Temp 98.3 F (36.8 C) (Oral)   Ht _0  (1.499 m)   Wt 222 lb 9.6 oz (101 kg)   BMI 44.96 kg/m  BP Readings from Last 3 Encounters:  01/20/20 (!) 141/66  10/28/19 122/74  10/24/19 129/72   Wt  Readings from Last 3 Encounters:  01/20/20 222 lb 9.6 oz (101 kg)  11/24/19 217 lb 12.8 oz (98.8 kg)  10/28/19 215 lb 1.6 oz (97.6 kg)      Physical Exam Vitals reviewed.  Constitutional:      Appearance: She is well-developed.  HENT:     Head: Normocephalic and atraumatic.  Pulmonary:     Effort: Pulmonary effort is normal. No respiratory distress.  Musculoskeletal:     Cervical back: Normal range of motion and neck supple.  Psychiatric:        Mood and Affect: Mood normal.        Behavior: Behavior normal.        Thought Content: Thought content normal.        Judgment: Judgment normal.  Results for orders placed or performed in visit on 01/20/20  POCT glycosylated hemoglobin (Hb A1C)  Result Value Ref Range   Hemoglobin A1C 8.8 (A) 4.0 - 5.6 %   Est. average glucose Bld gHb Est-mCnc 206     Assessment & Plan     1. DM type 2, uncontrolled, with neuropathy (HCC) A1c has improved from 12.3 to now 8.8. Continue Glipizide 56m BID, metformin 10020mBID, Januvia 10032maily, and Rybelsus 3mg67mily. F/U with endocrinology  No follow-ups on file.      I, JReynolds Bowl-C, have reviewed all documentation for this visit. The documentation on 01/31/20 for the exam, diagnosis, procedures, and orders are all accurate and complete.   JennRubye Beachrl[phone]) [phone])  [place] [place]

## 2020-01-20 ENCOUNTER — Encounter: Payer: Self-pay | Admitting: Physician Assistant

## 2020-01-20 ENCOUNTER — Other Ambulatory Visit: Payer: Self-pay

## 2020-01-20 ENCOUNTER — Ambulatory Visit (INDEPENDENT_AMBULATORY_CARE_PROVIDER_SITE_OTHER): Payer: Medicare Other | Admitting: Physician Assistant

## 2020-01-20 VITALS — BP 141/66 | HR 63 | Temp 98.3°F | Ht 59.0 in | Wt 222.6 lb

## 2020-01-20 DIAGNOSIS — E1140 Type 2 diabetes mellitus with diabetic neuropathy, unspecified: Secondary | ICD-10-CM | POA: Diagnosis not present

## 2020-01-20 DIAGNOSIS — E1165 Type 2 diabetes mellitus with hyperglycemia: Secondary | ICD-10-CM

## 2020-01-20 DIAGNOSIS — IMO0002 Reserved for concepts with insufficient information to code with codable children: Secondary | ICD-10-CM

## 2020-01-20 LAB — POCT GLYCOSYLATED HEMOGLOBIN (HGB A1C)
Est. average glucose Bld gHb Est-mCnc: 206
Hemoglobin A1C: 8.8 % — AB (ref 4.0–5.6)

## 2020-01-26 ENCOUNTER — Ambulatory Visit: Payer: Medicare Other | Admitting: Podiatry

## 2020-01-31 ENCOUNTER — Encounter: Payer: Self-pay | Admitting: Physician Assistant

## 2020-02-01 DIAGNOSIS — R0789 Other chest pain: Secondary | ICD-10-CM | POA: Diagnosis not present

## 2020-02-01 DIAGNOSIS — M25511 Pain in right shoulder: Secondary | ICD-10-CM | POA: Diagnosis not present

## 2020-02-01 DIAGNOSIS — Z882 Allergy status to sulfonamides status: Secondary | ICD-10-CM | POA: Diagnosis not present

## 2020-02-01 DIAGNOSIS — R160 Hepatomegaly, not elsewhere classified: Secondary | ICD-10-CM | POA: Diagnosis not present

## 2020-02-01 DIAGNOSIS — K838 Other specified diseases of biliary tract: Secondary | ICD-10-CM | POA: Diagnosis not present

## 2020-02-01 DIAGNOSIS — J45909 Unspecified asthma, uncomplicated: Secondary | ICD-10-CM | POA: Diagnosis not present

## 2020-02-01 DIAGNOSIS — R1011 Right upper quadrant pain: Secondary | ICD-10-CM | POA: Diagnosis not present

## 2020-02-01 DIAGNOSIS — N189 Chronic kidney disease, unspecified: Secondary | ICD-10-CM | POA: Diagnosis not present

## 2020-02-01 DIAGNOSIS — Z880 Allergy status to penicillin: Secondary | ICD-10-CM | POA: Diagnosis not present

## 2020-02-01 DIAGNOSIS — Z7984 Long term (current) use of oral hypoglycemic drugs: Secondary | ICD-10-CM | POA: Diagnosis not present

## 2020-02-01 DIAGNOSIS — E1122 Type 2 diabetes mellitus with diabetic chronic kidney disease: Secondary | ICD-10-CM | POA: Diagnosis not present

## 2020-02-01 DIAGNOSIS — K760 Fatty (change of) liver, not elsewhere classified: Secondary | ICD-10-CM | POA: Diagnosis not present

## 2020-02-01 DIAGNOSIS — Z9049 Acquired absence of other specified parts of digestive tract: Secondary | ICD-10-CM | POA: Diagnosis not present

## 2020-02-01 DIAGNOSIS — R079 Chest pain, unspecified: Secondary | ICD-10-CM | POA: Diagnosis not present

## 2020-02-09 ENCOUNTER — Ambulatory Visit (INDEPENDENT_AMBULATORY_CARE_PROVIDER_SITE_OTHER): Payer: Medicare Other | Admitting: Physician Assistant

## 2020-02-09 ENCOUNTER — Other Ambulatory Visit: Payer: Self-pay

## 2020-02-09 ENCOUNTER — Encounter: Payer: Self-pay | Admitting: Physician Assistant

## 2020-02-09 VITALS — BP 118/81 | HR 90 | Temp 98.2°F | Resp 16 | Wt 220.2 lb

## 2020-02-09 DIAGNOSIS — K219 Gastro-esophageal reflux disease without esophagitis: Secondary | ICD-10-CM | POA: Diagnosis not present

## 2020-02-09 DIAGNOSIS — R079 Chest pain, unspecified: Secondary | ICD-10-CM | POA: Diagnosis not present

## 2020-02-09 DIAGNOSIS — R8290 Unspecified abnormal findings in urine: Secondary | ICD-10-CM

## 2020-02-09 DIAGNOSIS — M25561 Pain in right knee: Secondary | ICD-10-CM | POA: Diagnosis not present

## 2020-02-09 DIAGNOSIS — R197 Diarrhea, unspecified: Secondary | ICD-10-CM | POA: Diagnosis not present

## 2020-02-09 DIAGNOSIS — G8929 Other chronic pain: Secondary | ICD-10-CM

## 2020-02-09 LAB — POCT URINALYSIS DIPSTICK
Bilirubin, UA: NEGATIVE
Blood, UA: NEGATIVE
Glucose, UA: NEGATIVE
Ketones, UA: NEGATIVE
Leukocytes, UA: NEGATIVE
Nitrite, UA: NEGATIVE
Protein, UA: NEGATIVE
Spec Grav, UA: 1.02 (ref 1.010–1.025)
Urobilinogen, UA: 0.2 E.U./dL
pH, UA: 6 (ref 5.0–8.0)

## 2020-02-09 MED ORDER — OMEPRAZOLE 20 MG PO CPDR: 20.0000 mg | DELAYED_RELEASE_CAPSULE | Freq: Every day | ORAL | 3 refills | Status: DC

## 2020-02-09 NOTE — Progress Notes (Signed)
Established patient visit   Patient: Alexis Lloyd   DOB: Dec 27, 1960   59 y.o. Female  MRN: [id number] Visit Date: 02/09/2020  Today's healthcare provider: Mar Daring, PA-C   Chief Complaint  Patient presents with  . ER follow up   Subjective    HPI  Follow up ER visit  Patient was seen in ER for Chest Pain/RUQ pain on 02/01/20. She was treated for Chest Pain Treatment for this included take tylenol or motrin as needed for your pain  She reports excellent compliance with treatment. She reports this condition is Improved. She reports that she is having indigestion and lots of burping and gassy.On Wednesday morning she woke up and she "had dirtied herself". Reports that her stomach doesn't feel sour and loose stool/diarrhea is better.  -----------------------------------------------------------------------------------------  Patient Active Problem List   Diagnosis Date Noted  . Pain due to onychomycosis of toenails of both feet 10/24/2019  . Benign skin lesion 10/24/2019  . Recurrent UTI 01/22/2018  . Recurrent vaginitis 01/22/2018  . History of Helicobacter pylori infection 11/05/2017  . Intrinsic eczema 11/05/2017  . CLE (columnar lined esophagus)   . Stomach irritation   . Gastric polyp   . Problems with swallowing and mastication   . Heartburn   . Benign neoplasm of transverse colon   . Special screening for malignant neoplasms, colon   . Internal hemorrhoids   . Diverticulosis of large intestine without diverticulitis   . Warts of foot 06/30/2017  . Achilles tendon contracture 01/28/2017  . Hallux rigidus of left foot 01/28/2017  . Hallux rigidus, right foot 01/28/2017  . Pes planus 01/28/2017  . Posterior tibial tendinitis of left lower extremity 01/28/2017  . Posterior tibial tendinitis of right lower extremity 01/28/2017  . Asthma 09/03/2015  . Foot pain 07/16/2015  . DM type 2, uncontrolled, with neuropathy ([place]) 07/13/2015  . Abnormal  finding on mammography, microcalcification 04/25/2015  . Intraductal carcinoma of breast 04/24/2015  . Airway hyperreactivity 11/18/2014  . BMI 40.0-44.9, adult ([place]) 11/18/2014  . Breast cyst 11/18/2014  . Diabetic neuropathy ([place]) 11/18/2014  . GERD (gastroesophageal reflux disease) 11/18/2014  . 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 Hypercholesteremia 11/18/2014  . Hypertension 11/18/2014  . Arthritis, degenerative 11/18/2014  . Allergic rhinitis, seasonal 11/18/2014  . OSA (obstructive sleep apnea) 11/18/2014  . AC (acromioclavicular) arthritis 11/08/2014  . Personal history of breast cancer 10/04/2014  . Primary osteoarthritis of right knee 11/14/2013   Past Medical History:  Diagnosis Date  . Allergy   . Arthritis   . Breast cancer ([place])   . Breast cancer, left breast ([place]) 10/04/2014  . Diabetes mellitus without complication ([place])   . GERD (gastroesophageal reflux disease)   . Hyperlipidemia   . Hypertension   . Osteoporosis   . Sleep apnea        Medications: Outpatient Medications Prior to Visit  Medication Sig  . albuterol (PROVENTIL HFA) 108 (90 Base) MCG/ACT inhaler Inhale 2 puffs every 6 (six) hours as needed into the lungs.  . Blood Glucose Monitoring Suppl (ONETOUCH VERIO) w/Device KIT To check blood sugar once daily  . cetirizine (ZYRTEC) 10 MG tablet Take 1 tablet (10 mg total) by mouth daily.  Marland Kitchen glipiZIDE (GLUCOTROL) 10 MG tablet Take 1 tablet (10 mg total) by mouth 2 (two) times daily before a meal.  . glucose blood (ONETOUCH VERIO) test strip To check blood sugar once daily  . ketoconazole (NIZORAL) 2 % cream APPLY CREAM TOPICALLY TWICE  DAILY TO FEET  . Lancets (ONETOUCH ULTRASOFT) lancets To check BS once daily  . lisinopril-hydrochlorothiazide (ZESTORETIC) 20-12.5 MG tablet TAKE 2 TABLETS BY MOUTH  DAILY  . metFORMIN (GLUCOPHAGE) 1000 MG tablet TAKE 1 TABLET BY MOUTH  TWICE DAILY WITH A MEAL  . montelukast (SINGULAIR) 10 MG tablet TAKE 1 TABLET BY MOUTH   DAILY  . RYBELSUS 3 MG TABS Take 1 tablet by mouth every morning.  . sitaGLIPtin (JANUVIA) 100 MG tablet Take 1 tablet (100 mg total) by mouth daily.  Marland Kitchen spironolactone (ALDACTONE) 25 MG tablet TAKE 1 TABLET BY MOUTH  DAILY  . SUMAtriptan (IMITREX) 50 MG tablet Take 1 tablet (50 mg total) by mouth every 2 (two) hours as needed for migraine. No more than 222m in 24 hours  . terconazole (TERAZOL 7) 0.4 % vaginal cream Apply topically to external vaginal tissue at bedtime for itching  . verapamil (CALAN) 80 MG tablet TAKE 1 TABLET BY MOUTH  TWICE DAILY  . [DISCONTINUED] omeprazole (PRILOSEC) 20 MG capsule Take 1 capsule (20 mg total) by mouth daily.   No facility-administered medications prior to visit.    Review of Systems  Constitutional: Negative.   Respiratory: Negative.   Cardiovascular: Positive for chest pain.  Gastrointestinal: Positive for abdominal distention, abdominal pain and diarrhea.  Musculoskeletal: Positive for arthralgias.    Last CBC Lab Results  Component Value Date   WBC 9.4 07/07/2019   HGB 11.9 07/07/2019   HCT 34.9 07/07/2019   MCV 85 07/07/2019   MCH 29.0 07/07/2019   RDW 13.0 07/07/2019   PLT 324 025/85/2778  Last metabolic panel Lab Results  Component Value Date   GLUCOSE 178 (H) 07/07/2019   NA 136 07/07/2019   K 4.0 07/07/2019   CL 95 (L) 07/07/2019   CO2 23 07/07/2019   BUN 14 07/07/2019   CREATININE 0.81 07/07/2019   GFRNONAA 79 07/07/2019   GFRAA 91 07/07/2019   CALCIUM 10.1 07/07/2019   PROT 6.9 09/16/2018   ALBUMIN 4.0 09/16/2018   LABGLOB 2.9 09/16/2018   AGRATIO 1.4 09/16/2018   BILITOT 0.4 09/16/2018   ALKPHOS 82 09/16/2018   AST 7 09/16/2018   ALT 19 09/16/2018   ANIONGAP 8 02/21/2018      Objective    BP 118/81 (BP Location: Right Wrist, Patient Position: Sitting, Cuff Size: Normal)   Pulse 90   Temp 98.2 F (36.8 C) (Oral)   Resp 16   Wt 220 lb 3.2 oz (99.9 kg)   BMI 44.48 kg/m  BP Readings from Last 3 Encounters:    02/09/20 118/81  01/20/20 (!) 141/66  10/28/19 122/74   Wt Readings from Last 3 Encounters:  02/09/20 220 lb 3.2 oz (99.9 kg)  01/20/20 222 lb 9.6 oz (101 kg)  11/24/19 217 lb 12.8 oz (98.8 kg)      Physical Exam Vitals reviewed.  Constitutional:      General: She is not in acute distress.    Appearance: Normal appearance. She is well-developed. She is obese. She is not ill-appearing or diaphoretic.  Cardiovascular:     Rate and Rhythm: Normal rate and regular rhythm.     Heart sounds: Normal heart sounds. No murmur heard.  No friction rub. No gallop.   Pulmonary:     Effort: Pulmonary effort is normal. No respiratory distress.     Breath sounds: Normal breath sounds. No wheezing or rales.  Chest:     Chest wall: No tenderness.  Abdominal:  General: Bowel sounds are normal. There is no distension.     Palpations: Abdomen is soft. There is no mass.     Tenderness: There is no abdominal tenderness. There is no guarding or rebound.  Skin:    General: Skin is warm and dry.  Neurological:     General: No focal deficit present.     Mental Status: She is alert and oriented to person, place, and time.      Results for orders placed or performed in visit on 02/09/20  POCT urinalysis dipstick  Result Value Ref Range   Color, UA light Yellow    Clarity, UA Clear    Glucose, UA Negative Negative   Bilirubin, UA Negative    Ketones, UA Negative    Spec Grav, UA 1.020 1.010 - 1.025   Blood, UA Negative    pH, UA 6.0 5.0 - 8.0   Protein, UA Negative Negative   Urobilinogen, UA 0.2 0.2 or 1.0 E.U./dL   Nitrite, UA Negatvie    Leukocytes, UA Negative Negative   Appearance     Odor      Assessment & Plan     1. Bad odor of urine UA normal. No dysuria. Push fluids.  - POCT urinalysis dipstick  2. Chronic pain of right knee May use tylenol. Also discussed Voltaren gel OTC.   3. Right-sided chest pain Suspected due to GERD. ER work up normal.   4. Diarrhea,  unspecified type Occurred once. Has since self-resolved.  5. Gastroesophageal reflux disease without esophagitis Suspect source of ride side chest pain and stomach irritation. Will start Omeprazole 46m daily. Call if worsening.    Return if symptoms worsen or fail to improve.      IReynolds Bowl PA-C, have reviewed all documentation for this visit. The documentation on 02/15/20 for the exam, diagnosis, procedures, and orders are all accurate and complete.   JRubye Beach BKindred Hospital [phone](phone) [phone](fax)  [place]

## 2020-02-16 ENCOUNTER — Other Ambulatory Visit: Payer: Self-pay | Admitting: Physician Assistant

## 2020-02-16 DIAGNOSIS — E1140 Type 2 diabetes mellitus with diabetic neuropathy, unspecified: Secondary | ICD-10-CM

## 2020-02-16 DIAGNOSIS — IMO0002 Reserved for concepts with insufficient information to code with codable children: Secondary | ICD-10-CM

## 2020-02-16 DIAGNOSIS — E1165 Type 2 diabetes mellitus with hyperglycemia: Secondary | ICD-10-CM

## 2020-02-16 DIAGNOSIS — I10 Essential (primary) hypertension: Secondary | ICD-10-CM

## 2020-02-17 ENCOUNTER — Other Ambulatory Visit: Payer: Self-pay | Admitting: Physician Assistant

## 2020-02-17 DIAGNOSIS — J4520 Mild intermittent asthma, uncomplicated: Secondary | ICD-10-CM

## 2020-02-17 MED ORDER — ALBUTEROL SULFATE HFA 108 (90 BASE) MCG/ACT IN AERS: 2.0000 | INHALATION_SPRAY | Freq: Four times a day (QID) | RESPIRATORY_TRACT | 0 refills | Status: DC | PRN

## 2020-02-17 NOTE — Telephone Encounter (Signed)
[facility] faxed refill request for the following medications:  PROVENTIL HFA  AER   Please advise. Thanks, [employer]

## 2020-02-20 ENCOUNTER — Other Ambulatory Visit: Payer: Self-pay | Admitting: Physician Assistant

## 2020-02-21 ENCOUNTER — Other Ambulatory Visit: Payer: Self-pay | Admitting: Physician Assistant

## 2020-02-21 DIAGNOSIS — E1165 Type 2 diabetes mellitus with hyperglycemia: Secondary | ICD-10-CM

## 2020-02-21 DIAGNOSIS — IMO0002 Reserved for concepts with insufficient information to code with codable children: Secondary | ICD-10-CM

## 2020-02-21 DIAGNOSIS — I10 Essential (primary) hypertension: Secondary | ICD-10-CM

## 2020-02-21 DIAGNOSIS — J40 Bronchitis, not specified as acute or chronic: Secondary | ICD-10-CM

## 2020-02-21 DIAGNOSIS — E1140 Type 2 diabetes mellitus with diabetic neuropathy, unspecified: Secondary | ICD-10-CM

## 2020-02-21 DIAGNOSIS — J4520 Mild intermittent asthma, uncomplicated: Secondary | ICD-10-CM

## 2020-02-21 MED ORDER — SPIRONOLACTONE 25 MG PO TABS: 25.0000 mg | ORAL_TABLET | Freq: Every day | ORAL | 1 refills | Status: DC

## 2020-02-21 NOTE — Telephone Encounter (Signed)
Requested medication (s) are du-e for refill today: -  Requested medication (s) are on the active medication list: no  Last refill:  01/20/20  Future visit scheduled: no  Notes to clinic:  historic med and provider   Requested Prescriptions  Pending Prescriptions Disp Refills   RYBELSUS 3 MG TABS 30 tablet     Sig: Take 1 tablet by mouth every morning.      Off-Protocol Failed - 02/21/2020 11:03 AM      Failed - Medication not assigned to a protocol, review manually.      Passed - Valid encounter within last 12 months    Recent Outpatient Visits           1 week ago Bad odor of urine   [place], [place], [place]   1 month ago DM type 2, uncontrolled, with neuropathy [facility])   [place], Clearnce Sorrel, [place]   4 months ago Vaginal discharge   [place], [place], [place]   5 months ago Vaginal itching   [place], [place]   5 months ago Vaginal discharge   [facility] Fenton Malling M, [place]               Signed Prescriptions Disp Refills   spironolactone (ALDACTONE) 25 MG tablet 90 tablet 1    Sig: Take 1 tablet (25 mg total) by mouth daily.      Cardiovascular: Diuretics - Aldosterone Antagonist Passed - 02/21/2020 11:03 AM      Passed - Cr in normal range and within 360 days    Creatinine  Date Value Ref Range Status  04/14/2014 0.75 0.60 - 1.30 mg/dL Final   Creatinine, Ser  Date Value Ref Range Status  07/07/2019 0.81 0.57 - 1.00 mg/dL Final          Passed - K in normal range and within 360 days    Potassium  Date Value Ref Range Status  07/07/2019 4.0 3.5 - 5.2 mmol/L Final  04/14/2014 4.0 3.5 - 5.1 mmol/L Final          Passed - Na in normal range and within 360 days    Sodium  Date Value Ref Range Status  07/07/2019 136 134 - 144 mmol/L Final  04/14/2014 141 136 - 145 mmol/L Final          Passed - Last BP in normal  range    BP Readings from Last 1 Encounters:  02/09/20 118/81          Passed - Valid encounter within last 6 months    Recent Outpatient Visits           1 week ago Bad odor of urine   [place], [place], PA-C   1 month ago DM type 2, uncontrolled, with neuropathy [facility])   [place], [place], [place]   4 months ago Vaginal discharge   [place], [place], [place]   5 months ago Vaginal itching   [place], [place], [place]   5 months ago Vaginal discharge   [place], Anderson Malta M, [place]               Refused Prescriptions Disp Refills   omeprazole (PRILOSEC) 20 MG capsule 90 capsule     Sig: Take 1 capsule (20 mg total) by mouth daily.      Gastroenterology: Proton  Pump Inhibitors Passed - 02/21/2020 11:03 AM      Passed - Valid encounter within last 12 months    Recent Outpatient Visits           1 week ago Bad odor of urine   [place], [place], [place]   1 month ago DM type 2, uncontrolled, with neuropathy [facility])   [place], [place]   4 months ago Vaginal discharge   [place], [place]   5 months ago Vaginal itching   [place], [place]   5 months ago Vaginal discharge   [facility] Fenton Malling M, [place]                lisinopril-hydrochlorothiazide (ZESTORETIC) 20-12.5 MG tablet 180 tablet     Sig: Take 2 tablets by mouth daily.      Cardiovascular:  ACEI + Diuretic Combos Failed - 02/21/2020 11:03 AM      Failed - Na in normal range and within 180 days    Sodium  Date Value Ref Range Status  07/07/2019 136 134 - 144 mmol/L Final  04/14/2014 141 136 - 145 mmol/L Final          Failed - K in normal range and within 180 days    Potassium  Date Value Ref Range Status   07/07/2019 4.0 3.5 - 5.2 mmol/L Final  04/14/2014 4.0 3.5 - 5.1 mmol/L Final          Failed - Cr in normal range and within 180 days    Creatinine  Date Value Ref Range Status  04/14/2014 0.75 0.60 - 1.30 mg/dL Final   Creatinine, Ser  Date Value Ref Range Status  07/07/2019 0.81 0.57 - 1.00 mg/dL Final          Failed - Ca in normal range and within 180 days    Calcium  Date Value Ref Range Status  07/07/2019 10.1 8.7 - 10.3 mg/dL Final   Calcium, Total  Date Value Ref Range Status  04/14/2014 9.1 8.5 - 10.1 mg/dL Final          Passed - Patient is not pregnant      Passed - Last BP in normal range    BP Readings from Last 1 Encounters:  02/09/20 118/81          Passed - Valid encounter within last 6 months    Recent Outpatient Visits           1 week ago Bad odor of urine   [employer], [place], PA-C   1 month ago DM type 2, uncontrolled, with neuropathy [facility])   [place], [place], [place]   4 months ago Vaginal discharge   [place], [place], [place]   5 months ago Vaginal itching   [facility] Fenton Malling M, [place]   5 months ago Vaginal discharge   [facility] Fenton Malling M, PA-C                verapamil (CALAN) 80 MG tablet 180 tablet     Sig: Take 1 tablet (80 mg total) by mouth 2 (two) times daily.      Cardiovascular:  Calcium Channel Blockers Passed - 02/21/2020 11:03 AM      Passed - Last BP in normal range    BP Readings from Last 1 Encounters:  02/09/20 118/81          Passed - Valid encounter within last 6 months    Recent Outpatient Visits           1 week ago Bad odor of urine   [place], [place], PA-C   1 month ago DM type 2, uncontrolled, with neuropathy [facility])   [place], [place], [place]   4 months ago Vaginal discharge   [facility] Fenton Malling M, [place]   5 months ago Vaginal itching   [facility] Fenton Malling M, [place]   5 months ago Vaginal discharge   [facility] Fenton Malling M, [place]                metFORMIN (GLUCOPHAGE) 1000 MG tablet 180 tablet     Sig: TAKE 1 TABLET BY MOUTH  TWICE DAILY WITH A MEAL      Endocrinology:  Diabetes - Biguanides Failed - 02/21/2020 11:03 AM      Failed - HBA1C is between 0 and 7.9 and within 180 days    Hemoglobin A1C  Date Value Ref Range Status  01/20/2020 8.8 (A) 4.0 - 5.6 % Final   Hgb A1c MFr Bld  Date Value Ref Range Status  07/07/2019 8.7 (H) 4.8 - 5.6 % Final    Comment:             Prediabetes: 5.7 - 6.4          Diabetes: >6.4          Glycemic control for adults with diabetes: <7.0           Passed - Cr in normal range and within 360 days    Creatinine  Date Value Ref Range Status  04/14/2014 0.75 0.60 - 1.30 mg/dL Final   Creatinine, Ser  Date Value Ref Range Status  07/07/2019 0.81 0.57 - 1.00 mg/dL Final          Passed - eGFR in normal range and within 360 days    EGFR (African American)  Date Value Ref Range Status  04/14/2014 >60 >74m/min Final  11/02/2013 >60  Final   GFR calc Af Amer  Date Value Ref Range Status  07/07/2019 91 >59 mL/min/1.73 Final   EGFR (Non-African Amer.)  Date Value Ref Range Status  04/14/2014 >60 >680mmin Final    Comment:    eGFR values <6070min/1.73 m2 may be an indication of chronic kidney disease (CKD). Calculated eGFR, using the MRDR Study equation, is useful in  patients with stable renal function. The eGFR calculation will not be reliable in acutely ill patients when serum creatinine is changing rapidly. It is not useful in patients on dialysis. The eGFR calculation may not be applicable to patients at the low and high extremes of body sizes, pregnant women, and vegetarians.   11/02/2013 >60  Final    Comment:    eGFR values  <59m59mn/1.73 m2 may be an indication of chronic kidney disease (CKD). Calculated eGFR is useful in patients with stable renal function. The eGFR calculation will not be reliable in acutely ill patients when serum creatinine is changing rapidly. It is not useful in  patients on dialysis. The eGFR calculation may not be applicable to patients at the low and high extremes of body sizes, pregnant women, and vegetarians.    GFR calc non Af Amer  Date Value Ref Range Status  07/07/2019 79 >59 mL/min/1.73 Final  Passed - Valid encounter within last 6 months    Recent Outpatient Visits           1 week ago Bad odor of urine   [place], [place], [place]   1 month ago DM type 2, uncontrolled, with neuropathy [facility])   [facility] [place], Clearnce Sorrel, [place]   4 months ago Vaginal discharge   [facility] Fenton Malling M, [place]   5 months ago Vaginal itching   [place], [place]   5 months ago Vaginal discharge   [facility] Fenton Malling M, PA-C                albuterol (PROVENTIL HFA) 108 (90 Base) MCG/ACT inhaler 18 g 0    Sig: Inhale 2 puffs into the lungs every 6 (six) hours as needed.      Pulmonology:  Beta Agonists Failed - 02/21/2020 11:03 AM      Failed - One inhaler should last at least one month. If the patient is requesting refills earlier, contact the patient to check for uncontrolled symptoms.      Passed - Valid encounter within last 12 months    Recent Outpatient Visits           1 week ago Bad odor of urine   [place], [place], [place]   1 month ago DM type 2, uncontrolled, with neuropathy [facility])   [place], Clearnce Sorrel, [place]   4 months ago Vaginal discharge   [place], [place], [place]   5 months ago Vaginal itching   [place], [place]   5 months ago Vaginal discharge   [facility] Fenton Malling M, PA-C                montelukast (SINGULAIR) 10 MG tablet 90 tablet 3    Sig: Take 1 tablet (10 mg total) by mouth daily.      Pulmonology:  Leukotriene Inhibitors Passed - 02/21/2020 11:03 AM      Passed - Valid encounter within last 12 months    Recent Outpatient Visits           1 week ago Bad odor of urine   [place], [place], PA-C   1 month ago DM type 2, uncontrolled, with neuropathy [facility])   [place], [place]   4 months ago Vaginal discharge   [facility] Fenton Malling M, [place]   5 months ago Vaginal itching   [facility] Fenton Malling M, [place]   5 months ago Vaginal discharge   [facility] Fenton Malling M, PA-C                glipiZIDE (GLUCOTROL) 10 MG tablet 180 tablet 1    Sig: Take 1 tablet (10 mg total) by mouth 2 (two) times daily before a meal.      Endocrinology:  Diabetes - Sulfonylureas Failed - 02/21/2020 11:03 AM      Failed - HBA1C is between 0 and 7.9 and within 180 days    Hemoglobin A1C  Date Value Ref Range Status  01/20/2020 8.8 (A) 4.0 - 5.6 % Final   Hgb A1c MFr Bld  Date Value Ref Range Status  07/07/2019 8.7 (H) 4.8 - 5.6 % Final    Comment:  Prediabetes: 5.7 - 6.4          Diabetes: >6.4          Glycemic control for adults with diabetes: <7.0           Passed - Valid encounter within last 6 months    Recent Outpatient Visits           1 week ago Bad odor of urine   [place], PA-C   1 month ago DM type 2, uncontrolled, with neuropathy [facility])   [place], [place]   4 months ago Vaginal discharge   [place], [place]   5 months ago Vaginal itching   [place], [place]   5 months ago Vaginal discharge   [facility] [place], [place], [place]

## 2020-02-21 NOTE — Telephone Encounter (Signed)
PT need a refill  omeprazole (PRILOSEC) 20 MG capsule [[id number]]  lisinopril-hydrochlorothiazide (ZESTORETIC) 20-12.5 MG tablet [[id number]]  verapamil (CALAN) 80 MG tablet [[id number]]  metFORMIN (GLUCOPHAGE) 1000 MG tablet [[id number]]  spironolactone (ALDACTONE) 25 MG tablet [[id number]]  RYBELSUS 3 MG TABS [[id number]]   albuterol (PROVENTIL HFA) 108 (90 Base) MCG/ACT inhaler [[id number]]  montelukast (SINGULAIR) 10 MG tablet [[id number]] glipiZIDE (GLUCOTROL) 10 MG tablet [[id number]]  [facility] [place], [address], [place] [address]  Phone: [phone] Fax: [phone]

## 2020-02-21 NOTE — Telephone Encounter (Signed)
Requested Prescriptions  Pending Prescriptions Disp Refills  . spironolactone (ALDACTONE) 25 MG tablet 90 tablet 1    Sig: Take 1 tablet (25 mg total) by mouth daily.     Cardiovascular: Diuretics - Aldosterone Antagonist Passed - 02/21/2020 11:03 AM      Passed - Cr in normal range and within 360 days    Creatinine  Date Value Ref Range Status  04/14/2014 0.75 0.60 - 1.30 mg/dL Final   Creatinine, Ser  Date Value Ref Range Status  07/07/2019 0.81 0.57 - 1.00 mg/dL Final         Passed - K in normal range and within 360 days    Potassium  Date Value Ref Range Status  07/07/2019 4.0 3.5 - 5.2 mmol/L Final  04/14/2014 4.0 3.5 - 5.1 mmol/L Final         Passed - Na in normal range and within 360 days    Sodium  Date Value Ref Range Status  07/07/2019 136 134 - 144 mmol/L Final  04/14/2014 141 136 - 145 mmol/L Final         Passed - Last BP in normal range    BP Readings from Last 1 Encounters:  02/09/20 118/81         Passed - Valid encounter within last 6 months    Recent Outpatient Visits          1 week ago Bad odor of urine   [place], [place], PA-C   1 month ago DM type 2, uncontrolled, with neuropathy [facility])   [place], [place], [place]   4 months ago Vaginal discharge   [place], [place]   5 months ago Vaginal itching   [place], [place]   5 months ago Vaginal discharge   [place], Jennifer M, PA-C             . RYBELSUS 3 MG TABS 30 tablet     Sig: Take 1 tablet by mouth every morning.     Off-Protocol Failed - 02/21/2020 11:03 AM      Failed - Medication not assigned to a protocol, review manually.      Passed - Valid encounter within last 12 months    Recent Outpatient Visits          1 week ago Bad odor of urine   [place], [place]   1 month ago DM type 2,  uncontrolled, with neuropathy [facility])   [place], Clearnce Sorrel, [place]   4 months ago Vaginal discharge   [place], [place]   5 months ago Vaginal itching   [place], [place]   5 months ago Vaginal discharge   [place], [place]             Refused Prescriptions Disp Refills  . omeprazole (PRILOSEC) 20 MG capsule 90 capsule     Sig: Take 1 capsule (20 mg total) by mouth daily.     Gastroenterology: Proton Pump Inhibitors Passed - 02/21/2020 11:03 AM      Passed - Valid encounter within last 12 months    Recent Outpatient Visits          1 week ago Bad odor of urine   [place], [place]   1 month ago DM type 2,  uncontrolled, with neuropathy [facility])   [place], [place]   4 months ago Vaginal discharge   [place], [place]   5 months ago Vaginal itching   [place], [place]   5 months ago Vaginal discharge   [facility] Fenton Malling M, [place]             . lisinopril-hydrochlorothiazide (ZESTORETIC) 20-12.5 MG tablet 180 tablet     Sig: Take 2 tablets by mouth daily.     Cardiovascular:  ACEI + Diuretic Combos Failed - 02/21/2020 11:03 AM      Failed - Na in normal range and within 180 days    Sodium  Date Value Ref Range Status  07/07/2019 136 134 - 144 mmol/L Final  04/14/2014 141 136 - 145 mmol/L Final         Failed - K in normal range and within 180 days    Potassium  Date Value Ref Range Status  07/07/2019 4.0 3.5 - 5.2 mmol/L Final  04/14/2014 4.0 3.5 - 5.1 mmol/L Final         Failed - Cr in normal range and within 180 days    Creatinine  Date Value Ref Range Status  04/14/2014 0.75 0.60 - 1.30 mg/dL Final   Creatinine, Ser  Date Value Ref Range Status  07/07/2019 0.81  0.57 - 1.00 mg/dL Final         Failed - Ca in normal range and within 180 days    Calcium  Date Value Ref Range Status  07/07/2019 10.1 8.7 - 10.3 mg/dL Final   Calcium, Total  Date Value Ref Range Status  04/14/2014 9.1 8.5 - 10.1 mg/dL Final         Passed - Patient is not pregnant      Passed - Last BP in normal range    BP Readings from Last 1 Encounters:  02/09/20 118/81         Passed - Valid encounter within last 6 months    Recent Outpatient Visits          1 week ago Bad odor of urine   [employer], [place], PA-C   1 month ago DM type 2, uncontrolled, with neuropathy [facility])   [place], [place], [place]   4 months ago Vaginal discharge   [place], [place], [place]   5 months ago Vaginal itching   [facility] Fenton Malling M, [place]   5 months ago Vaginal discharge   [place], Anderson Malta M, PA-C             . verapamil (CALAN) 80 MG tablet 180 tablet     Sig: Take 1 tablet (80 mg total) by mouth 2 (two) times daily.     Cardiovascular:  Calcium Channel Blockers Passed - 02/21/2020 11:03 AM      Passed - Last BP in normal range    BP Readings from Last 1 Encounters:  02/09/20 118/81         Passed - Valid encounter within last 6 months    Recent Outpatient Visits          1 week ago Bad odor of urine   [place], PA-C   1 month ago DM type 2, uncontrolled, with neuropathy [facility])   [facility] [place], [place]  4 months ago Vaginal discharge   [facility] Fenton Malling M, [place]   5 months ago Vaginal itching   [facility] Fenton Malling M, [place]   5 months ago Vaginal discharge   [facility] Fenton Malling M, [place]             . metFORMIN (GLUCOPHAGE) 1000 MG tablet 180 tablet     Sig: TAKE 1 TABLET BY  MOUTH  TWICE DAILY WITH A MEAL     Endocrinology:  Diabetes - Biguanides Failed - 02/21/2020 11:03 AM      Failed - HBA1C is between 0 and 7.9 and within 180 days    Hemoglobin A1C  Date Value Ref Range Status  01/20/2020 8.8 (A) 4.0 - 5.6 % Final   Hgb A1c MFr Bld  Date Value Ref Range Status  07/07/2019 8.7 (H) 4.8 - 5.6 % Final    Comment:             Prediabetes: 5.7 - 6.4          Diabetes: >6.4          Glycemic control for adults with diabetes: <7.0          Passed - Cr in normal range and within 360 days    Creatinine  Date Value Ref Range Status  04/14/2014 0.75 0.60 - 1.30 mg/dL Final   Creatinine, Ser  Date Value Ref Range Status  07/07/2019 0.81 0.57 - 1.00 mg/dL Final         Passed - eGFR in normal range and within 360 days    EGFR (African American)  Date Value Ref Range Status  04/14/2014 >60 >59m/min Final  11/02/2013 >60  Final   GFR calc Af Amer  Date Value Ref Range Status  07/07/2019 91 >59 mL/min/1.73 Final   EGFR (Non-African Amer.)  Date Value Ref Range Status  04/14/2014 >60 >696mmin Final    Comment:    eGFR values <6030min/1.73 m2 may be an indication of chronic kidney disease (CKD). Calculated eGFR, using the MRDR Study equation, is useful in  patients with stable renal function. The eGFR calculation will not be reliable in acutely ill patients when serum creatinine is changing rapidly. It is not useful in patients on dialysis. The eGFR calculation may not be applicable to patients at the low and high extremes of body sizes, pregnant women, and vegetarians.   11/02/2013 >60  Final    Comment:    eGFR values <77m74mn/1.73 m2 may be an indication of chronic kidney disease (CKD). Calculated eGFR is useful in patients with stable renal function. The eGFR calculation will not be reliable in acutely ill patients when serum creatinine is changing rapidly. It is not useful in  patients on dialysis. The eGFR calculation may not be  applicable to patients at the low and high extremes of body sizes, pregnant women, and vegetarians.    GFR calc non Af Amer  Date Value Ref Range Status  07/07/2019 79 >59 mL/min/1.73 Final         Passed - Valid encounter within last 6 months    Recent Outpatient Visits          1 week ago Bad odor of urine   [facility]   1 month ago DM type 2, uncontrolled, with [facility] Vag[facility]   [facility]  Clearnce [place], PA-C   5 months ago Vaginal itching   [facility] Fenton Malling M, [place]   5 months ago Vaginal discharge   [facility] Fenton Malling M, [place]             . albuterol (PROVENTIL HFA) 108 (90 Base) MCG/ACT inhaler 18 g 0    Sig: Inhale 2 puffs into the lungs every 6 (six) hours as needed.     Pulmonology:  Beta Agonists Failed - 02/21/2020 11:03 AM      Failed - One inhaler should last at least one month. If the patient is requesting refills earlier, contact the patient to check for uncontrolled symptoms.      Passed - Valid encounter within last 12 months    Recent Outpatient Visits          1 week ago Bad odor of urine   [place], [place], [place]   1 month ago DM type 2, uncontrolled, with neuropathy [facility])   [facility] [place], Clearnce Sorrel, [place]   4 months ago Vaginal discharge   [facility] Fenton Malling M, [place]   5 months ago Vaginal itching   [place], [place]   5 months ago Vaginal discharge   [facility] Fenton Malling M, PA-C             . montelukast (SINGULAIR) 10 MG tablet 90 tablet 3    Sig: Take 1 tablet (10 mg total) by mouth daily.     Pulmonology:  Leukotriene Inhibitors Passed - 02/21/2020 11:03 AM      Passed - Valid encounter  within last 12 months    Recent Outpatient Visits          1 week ago Bad odor of urine   [place], [place], PA-C   1 month ago DM type 2, uncontrolled, with neuropathy [facility])   [facility] [place], [place], [place]   4 months ago Vaginal discharge   [facility] Fenton Malling M, [place]   5 months ago Vaginal itching   [facility] Fenton Malling M, [place]   5 months ago Vaginal discharge   [facility] Fenton Malling M, PA-C             . glipiZIDE (GLUCOTROL) 10 MG tablet 180 tablet 1    Sig: Take 1 tablet (10 mg total) by mouth 2 (two) times daily before a meal.     Endocrinology:  Diabetes - Sulfonylureas Failed - 02/21/2020 11:03 AM      Failed - HBA1C is between 0 and 7.9 and within 180 days    Hemoglobin A1C  Date Value Ref Range Status  01/20/2020 8.8 (A) 4.0 - 5.6 % Final   Hgb A1c MFr Bld  Date Value Ref Range Status  07/07/2019 8.7 (H) 4.8 - 5.6 % Final    Comment:             Prediabetes: 5.7 - 6.4          Diabetes: >6.4          Glycemic control for adults with diabetes: <7.0          Passed - Valid encounter within last 6 months    Recent Outpatient Visits          1 week ago Bad odor of urine   [place], [place], [place]  1 month ago DM type 2, uncontrolled, with neuropathy [facility])   [place], [place]   4 months ago Vaginal discharge   [place], [place]   5 months ago Vaginal itching   [place], [place]   5 months ago Vaginal discharge   [facility] Fenton Malling [place], [place]

## 2020-02-22 DIAGNOSIS — M1711 Unilateral primary osteoarthritis, right knee: Secondary | ICD-10-CM | POA: Diagnosis not present

## 2020-02-22 MED ORDER — RYBELSUS 3 MG PO TABS: 1.0000 | ORAL_TABLET | Freq: Every morning | ORAL | 0 refills | Status: DC

## 2020-03-06 DIAGNOSIS — E119 Type 2 diabetes mellitus without complications: Secondary | ICD-10-CM | POA: Diagnosis not present

## 2020-03-06 LAB — HM DIABETES EYE EXAM

## 2020-03-09 ENCOUNTER — Encounter: Payer: Self-pay | Admitting: Physician Assistant

## 2020-03-28 DIAGNOSIS — E1165 Type 2 diabetes mellitus with hyperglycemia: Secondary | ICD-10-CM | POA: Diagnosis not present

## 2020-03-28 DIAGNOSIS — E782 Mixed hyperlipidemia: Secondary | ICD-10-CM | POA: Diagnosis not present

## 2020-03-28 DIAGNOSIS — E1142 Type 2 diabetes mellitus with diabetic polyneuropathy: Secondary | ICD-10-CM | POA: Diagnosis not present

## 2020-03-28 DIAGNOSIS — I10 Essential (primary) hypertension: Secondary | ICD-10-CM | POA: Diagnosis not present

## 2020-03-29 ENCOUNTER — Ambulatory Visit: Payer: Self-pay | Admitting: *Deleted

## 2020-03-29 NOTE — Chronic Care Management (AMB) (Signed)
Patient's CCM status changed to previously enrolled.    Elliot Gurney, [place] Worker  [facility] Care Management [phone]

## 2020-04-23 ENCOUNTER — Other Ambulatory Visit: Payer: Self-pay | Admitting: Physician Assistant

## 2020-04-23 DIAGNOSIS — I10 Essential (primary) hypertension: Secondary | ICD-10-CM

## 2020-04-25 DIAGNOSIS — M25561 Pain in right knee: Secondary | ICD-10-CM | POA: Diagnosis not present

## 2020-04-25 DIAGNOSIS — M170 Bilateral primary osteoarthritis of knee: Secondary | ICD-10-CM | POA: Diagnosis not present

## 2020-04-25 DIAGNOSIS — M818 Other osteoporosis without current pathological fracture: Secondary | ICD-10-CM | POA: Diagnosis not present

## 2020-04-25 DIAGNOSIS — M1711 Unilateral primary osteoarthritis, right knee: Secondary | ICD-10-CM | POA: Diagnosis not present

## 2020-04-25 MED ORDER — SPIRONOLACTONE 25 MG PO TABS: 25.0000 mg | ORAL_TABLET | Freq: Every day | ORAL | 1 refills | Status: DC

## 2020-04-25 NOTE — Telephone Encounter (Signed)
Pt calling to ask if you will transfer/cancel the Rx  spironolactone (ALDACTONE) 25 MG tablet   at [facility].  Pt never got this Rx from them because she is in the [place].  Pt can get it cheaper from [facility]. Can you send to  [facility] [address]), [address] Phone:  [phone]  Fax:  [phone]

## 2020-04-25 NOTE — Addendum Note (Signed)
Addended by: Jefferson Fuel on: 04/25/2020 08:32 AM   Modules accepted: Orders

## 2020-05-07 ENCOUNTER — Ambulatory Visit (INDEPENDENT_AMBULATORY_CARE_PROVIDER_SITE_OTHER): Payer: Medicare Other | Admitting: Physician Assistant

## 2020-05-07 ENCOUNTER — Other Ambulatory Visit: Payer: Self-pay

## 2020-05-07 ENCOUNTER — Encounter: Payer: Self-pay | Admitting: Physician Assistant

## 2020-05-07 VITALS — BP 121/70 | HR 98 | Temp 98.7°F | Wt 221.0 lb

## 2020-05-07 DIAGNOSIS — E1122 Type 2 diabetes mellitus with diabetic chronic kidney disease: Secondary | ICD-10-CM

## 2020-05-07 DIAGNOSIS — B379 Candidiasis, unspecified: Secondary | ICD-10-CM

## 2020-05-07 DIAGNOSIS — K219 Gastro-esophageal reflux disease without esophagitis: Secondary | ICD-10-CM

## 2020-05-07 DIAGNOSIS — Z6841 Body Mass Index (BMI) 40.0 and over, adult: Secondary | ICD-10-CM

## 2020-05-07 DIAGNOSIS — R1902 Left upper quadrant abdominal swelling, mass and lump: Secondary | ICD-10-CM

## 2020-05-07 DIAGNOSIS — N1831 Chronic kidney disease, stage 3a: Secondary | ICD-10-CM

## 2020-05-07 DIAGNOSIS — E66813 Obesity, class 3: Secondary | ICD-10-CM

## 2020-05-07 MED ORDER — PIOGLITAZONE HCL 45 MG PO TABS: 45.0000 mg | ORAL_TABLET | Freq: Every day | ORAL | 1 refills | Status: DC

## 2020-05-07 MED ORDER — PANTOPRAZOLE SODIUM 40 MG PO TBEC: 40.0000 mg | DELAYED_RELEASE_TABLET | Freq: Every day | ORAL | 3 refills | Status: DC

## 2020-05-07 MED ORDER — FLUCONAZOLE 150 MG PO TABS: 150.0000 mg | ORAL_TABLET | Freq: Once | ORAL | 0 refills | Status: AC

## 2020-05-07 NOTE — Progress Notes (Signed)
Established patient visit   Patient: Alexis Lloyd   DOB: [birth year]   59 y.o. Female  MRN: [id number] Visit Date: 05/07/2020  Today's healthcare provider: Mar Daring, PA-C   Chief Complaint  Patient presents with  . Abdominal Pain   Subjective    HPI  Abdominal Pain  She reports chronic abdominal pain. The most recent episode started several  weeks ago and is worsening. The abdominal pain is located in the left side and does not radiate. It is described as aching, shooting and stabbing, is severe in intensity, occurring constantly. It is aggravated by eating certain foods. and is relieved by nothing. She has tried nothing to try to relieve pain  Associated symptoms: Yes anorexia  Yes belching  No bloody stool No blood in urine   No constipation Yes diarrhea  No dysuria No fever  No flatus No headaches  No headaches No joint pains  No myalgias No nausea  No vomiting No weight loss     Recent GI studies:none Relevant medical history includes: T2DM  Previous labs Lab Results  Component Value Date   WBC 9.4 07/07/2019   HGB 11.9 07/07/2019   HCT 34.9 07/07/2019   MCV 85 07/07/2019   MCH 29.0 07/07/2019   RDW 13.0 07/07/2019   PLT 324 07/07/2019   Lab Results  Component Value Date   GLUCOSE 178 (H) 07/07/2019   NA 136 07/07/2019   K 4.0 07/07/2019   CL 95 (L) 07/07/2019   CO2 23 07/07/2019   BUN 14 07/07/2019   CREATININE 0.81 07/07/2019   GFRNONAA 79 07/07/2019   GFRAA 91 07/07/2019   CALCIUM 10.1 07/07/2019   PROT 6.9 09/16/2018   ALBUMIN 4.0 09/16/2018   LABGLOB 2.9 09/16/2018   AGRATIO 1.4 09/16/2018   BILITOT 0.4 09/16/2018   ALKPHOS 82 09/16/2018   AST 7 09/16/2018   ALT 19 09/16/2018   ANIONGAP 8 02/21/2018   No results found for: AMYLASE -----------------------------------------------------------------------------------------   Patient Active Problem List   Diagnosis Date Noted  . Pain due to onychomycosis of toenails of  both feet 10/24/2019  . Benign skin lesion 10/24/2019  . Recurrent UTI 01/22/2018  . Recurrent vaginitis 01/22/2018  . History of Helicobacter pylori infection 11/05/2017  . Intrinsic eczema 11/05/2017  . CLE (columnar lined esophagus)   . Stomach irritation   . Gastric polyp   . Problems with swallowing and mastication   . Heartburn   . Benign neoplasm of transverse colon   . Special screening for malignant neoplasms, colon   . Internal hemorrhoids   . Diverticulosis of large intestine without diverticulitis   . Warts of foot 06/30/2017  . Achilles tendon contracture 01/28/2017  . Hallux rigidus of left foot 01/28/2017  . Hallux rigidus, right foot 01/28/2017  . Pes planus 01/28/2017  . Posterior tibial tendinitis of left lower extremity 01/28/2017  . Posterior tibial tendinitis of right lower extremity 01/28/2017  . Asthma 09/03/2015  . Foot pain 07/16/2015  . DM type 2, uncontrolled, with neuropathy ([place]) 07/13/2015  . Abnormal finding on mammography, microcalcification 04/25/2015  . Intraductal carcinoma of breast 04/24/2015  . Airway hyperreactivity 11/18/2014  . BMI 40.0-44.9, adult ([place]) 11/18/2014  . Breast cyst 11/18/2014  . Diabetic neuropathy ([place]) 11/18/2014  . GERD (gastroesophageal reflux disease) 11/18/2014  . 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 Hypercholesteremia 11/18/2014  . Hypertension 11/18/2014  . Arthritis, degenerative 11/18/2014  . Allergic rhinitis, seasonal 11/18/2014  . OSA (obstructive sleep apnea)  11/18/2014  . AC (acromioclavicular) arthritis 11/08/2014  . Personal history of breast cancer 10/04/2014  . Primary osteoarthritis of right knee 11/14/2013   Past Medical History:  Diagnosis Date  . Allergy   . Arthritis   . Breast cancer ([place])   . Breast cancer, left breast ([place]) 10/04/2014  . Diabetes mellitus without complication ([place])   . GERD (gastroesophageal reflux disease)   . Hyperlipidemia   . Hypertension   . Osteoporosis   . Sleep  apnea    Social History   Tobacco Use  . Smoking status: Former Smoker    Packs/day: 0.25    Years: 21.00    Pack years: 5.25    Quit date: 06/08/1997    Years since quitting: 22.9  . Smokeless tobacco: Never Used  Vaping Use  . Vaping Use: Never used  Substance Use Topics  . Alcohol use: No  . Drug use: No   Allergies  Allergen Reactions  . Allegra [Fexofenadine] Nausea And Vomiting  . Avelox [Moxifloxacin] Other (See Comments)    Hallucinations  . Bactrim [Sulfamethoxazole-Trimethoprim] Other (See Comments)    unknown  . Etodolac Nausea Only  . Fish-Derived Products Nausea And Vomiting    With "seafood"  . Penicillins Diarrhea and Nausea And Vomiting  . Shellfish Allergy Nausea Only  . Sulfa Antibiotics Other (See Comments)    unknown  . Macrobid [employer] Macro] Other (See Comments)    Constipation and globus hystericus.     Medications: Outpatient Medications Prior to Visit  Medication Sig  . albuterol (PROVENTIL HFA) 108 (90 Base) MCG/ACT inhaler Inhale 2 puffs into the lungs every 6 (six) hours as needed.  . Blood Glucose Monitoring Suppl (ONETOUCH VERIO) w/Device KIT To check blood sugar once daily  . cetirizine (ZYRTEC) 10 MG tablet Take 1 tablet (10 mg total) by mouth daily.  Marland Kitchen glipiZIDE (GLUCOTROL) 10 MG tablet Take 1 tablet (10 mg total) by mouth 2 (two) times daily before a meal.  . glucose blood (ONETOUCH VERIO) test strip To check blood sugar once daily  . ketoconazole (NIZORAL) 2 % cream APPLY CREAM TOPICALLY TWICE DAILY TO FEET  . Lancets (ONETOUCH ULTRASOFT) lancets To check BS once daily  . lisinopril-hydrochlorothiazide (ZESTORETIC) 20-12.5 MG tablet TAKE 2 TABLETS BY MOUTH  DAILY  . metFORMIN (GLUCOPHAGE) 1000 MG tablet TAKE 1 TABLET BY MOUTH  TWICE DAILY WITH A MEAL  . montelukast (SINGULAIR) 10 MG tablet TAKE 1 TABLET BY MOUTH  DAILY  . omeprazole (PRILOSEC) 20 MG capsule TAKE 1 CAPSULE BY MOUTH  DAILY  . RYBELSUS 3 MG TABS Take 1  tablet by mouth every morning.  . sitaGLIPtin (JANUVIA) 100 MG tablet Take 1 tablet (100 mg total) by mouth daily.  Marland Kitchen spironolactone (ALDACTONE) 25 MG tablet Take 1 tablet (25 mg total) by mouth daily.  . SUMAtriptan (IMITREX) 50 MG tablet Take 1 tablet (50 mg total) by mouth every 2 (two) hours as needed for migraine. No more than 224m in 24 hours  . terconazole (TERAZOL 7) 0.4 % vaginal cream Apply topically to external vaginal tissue at bedtime for itching  . verapamil (CALAN) 80 MG tablet TAKE 1 TABLET BY MOUTH  TWICE DAILY   No facility-administered medications prior to visit.    Review of Systems  Constitutional: Positive for fatigue. Negative for activity change, appetite change, chills, diaphoresis, fever and unexpected weight change.  HENT: Positive for postnasal drip. Negative for congestion, ear discharge, ear pain, hearing loss, rhinorrhea, sinus pressure, sinus  pain, sneezing, sore throat, tinnitus, trouble swallowing and voice change.   Respiratory: Negative for apnea, cough, choking, chest tightness, shortness of breath, wheezing and stridor.   Cardiovascular: Negative for chest pain, palpitations and leg swelling.  Gastrointestinal: Positive for abdominal distention, abdominal pain, diarrhea and nausea. Negative for anal bleeding, blood in stool, constipation, rectal pain and vomiting.  Genitourinary: Negative.  Negative for vaginal bleeding and vaginal pain.       Pt thinks she has a yeast infection  Musculoskeletal: Negative for myalgias.  Neurological: Negative for dizziness, light-headedness and headaches.     Objective    BP 121/70 (BP Location: Right Arm, Patient Position: Sitting, Cuff Size: Large)   Pulse 98   Temp 98.7 F (37.1 C) (Oral)   Wt 221 lb (100.2 kg)   BMI 44.64 kg/m    Physical Exam Vitals reviewed.  Constitutional:      General: She is not in acute distress.    Appearance: Normal appearance. She is well-developed and well-groomed. She is  obese. She is not ill-appearing or diaphoretic.  Cardiovascular:     Rate and Rhythm: Normal rate and regular rhythm.     Heart sounds: Normal heart sounds. No murmur heard.  No friction rub. No gallop.   Pulmonary:     Effort: Pulmonary effort is normal. No respiratory distress.     Breath sounds: Normal breath sounds. No wheezing or rales.  Abdominal:     General: Abdomen is protuberant. Bowel sounds are normal. There is no distension.     Palpations: Abdomen is soft. There is mass (soft tissue LUQ).     Tenderness: There is abdominal tenderness in the epigastric area and left upper quadrant. There is no guarding or rebound.       Comments: Suprapubic tenderness  Skin:    General: Skin is warm and dry.  Neurological:     General: No focal deficit present.     Mental Status: She is alert and oriented to person, place, and time. Mental status is at baseline.  Psychiatric:        Behavior: Behavior is cooperative.       No results found for any visits on 05/07/20.  Assessment & Plan     1. Type 2 diabetes mellitus with stage 3a chronic kidney disease, without long-term current use of insulin ([place]) Patient having diarrhea and fecal incontinence. Will stop Metformin. Start Pioglitazone. Continue Glipizide and Rybelsus. Referral for financial assistance on Rybelsus placed. F/U in 4 weeks.  - pioglitazone (ACTOS) 45 MG tablet; Take 1 tablet (45 mg total) by mouth daily.  Dispense: 90 tablet; Refill: 1 - Referral to Chronic Care Management Services  2. Class 3 severe obesity due to excess calories with serious comorbidity and body mass index (BMI) of 40.0 to 44.9 in adult [facility]) Counseled patient on healthy lifestyle modifications including dieting and exercise.   3. Gastroesophageal reflux disease without esophagitis Stop Omeprazole. Change to Pantoprazole as below. F/U in 4 weeks.  - pantoprazole (PROTONIX) 40 MG tablet; Take 1 tablet (40 mg total) by mouth daily.  Dispense: 30  tablet; Refill: 3  4. Yeast infection For possible yeast. Gets frequently due to diabetes.  - fluconazole (DIFLUCAN) 150 MG tablet; Take 1 tablet (150 mg total) by mouth once for 1 dose. Repeat in 48-72 hrs if needed  Dispense: 2 tablet; Refill: 0  5. Abdominal mass, LUQ (left upper quadrant) Causing patient pain. Will get imaging as below. Suspect soft tissue cyst vs lipoma  most likely.  - US Abdomen Limited; Future   No follow-ups on file.      Reynolds Bowl, PA-C, have reviewed all documentation for this visit. The documentation on 05/08/20 for the exam, diagnosis, procedures, and orders are all accurate and complete.   Rubye Beach  [facility] [phone] (phone) [phone] (fax)  [place]

## 2020-05-07 NOTE — Patient Instructions (Signed)
Stop Metformin, start Pioglitazone Continue Glipizide and Rybelsus for now  Stop Omeprazole, Start Protonix  US of the abdomen has been ordered; Radiology department will call to schedule  Referral to [facility] pharmacist has been placed for financial assistance with Rybelsus

## 2020-05-08 ENCOUNTER — Encounter: Payer: Self-pay | Admitting: Physician Assistant

## 2020-05-11 ENCOUNTER — Ambulatory Visit
Admission: RE | Admit: 2020-05-11 | Discharge: 2020-05-11 | Disposition: A | Discharge status: Home / Self Care | Payer: Medicare Other | Source: Ambulatory Visit | Attending: Physician Assistant | Admitting: Physician Assistant

## 2020-05-11 ENCOUNTER — Other Ambulatory Visit: Payer: Self-pay

## 2020-05-11 DIAGNOSIS — R1902 Left upper quadrant abdominal swelling, mass and lump: Secondary | ICD-10-CM | POA: Insufficient documentation

## 2020-05-14 ENCOUNTER — Telehealth: Payer: Self-pay | Admitting: *Deleted

## 2020-05-14 ENCOUNTER — Telehealth: Payer: Self-pay

## 2020-05-14 NOTE — Progress Notes (Signed)
    Chronic Care Management Pharmacy Assistant   Name: Alexis Lloyd  MRN: [id number] DOB: 1960-09-16  Reason for Encounter: Medication Management  Alexis Lloyd,  59 y.o. , female  PCP : Mar Daring, PA-C  Allergies:   Allergies  Allergen Reactions  . Allegra [Fexofenadine] Nausea And Vomiting  . Avelox [Moxifloxacin] Other (See Comments)    Hallucinations  . Bactrim [Sulfamethoxazole-Trimethoprim] Other (See Comments)    unknown  . Etodolac Nausea Only  . Fish-Derived Products Nausea And Vomiting    With "seafood"  . Penicillins Diarrhea and Nausea And Vomiting  . Shellfish Allergy Nausea Only  . Sulfa Antibiotics Other (See Comments)    unknown  . Macrobid [employer] Macro] Other (See Comments)    Constipation and globus hystericus.    Medications: Outpatient Encounter Medications as of 05/14/2020  Medication Sig  . albuterol (PROVENTIL HFA) 108 (90 Base) MCG/ACT inhaler Inhale 2 puffs into the lungs every 6 (six) hours as needed.  . Blood Glucose Monitoring Suppl (ONETOUCH VERIO) w/Device KIT To check blood sugar once daily  . cetirizine (ZYRTEC) 10 MG tablet Take 1 tablet (10 mg total) by mouth daily.  Marland Kitchen glipiZIDE (GLUCOTROL) 10 MG tablet Take 1 tablet (10 mg total) by mouth 2 (two) times daily before a meal.  . glucose blood (ONETOUCH VERIO) test strip To check blood sugar once daily  . ketoconazole (NIZORAL) 2 % cream APPLY CREAM TOPICALLY TWICE DAILY TO FEET  . Lancets (ONETOUCH ULTRASOFT) lancets To check BS once daily  . lisinopril-hydrochlorothiazide (ZESTORETIC) 20-12.5 MG tablet TAKE 2 TABLETS BY MOUTH  DAILY  . montelukast (SINGULAIR) 10 MG tablet TAKE 1 TABLET BY MOUTH  DAILY  . pantoprazole (PROTONIX) 40 MG tablet Take 1 tablet (40 mg total) by mouth daily.  . pioglitazone (ACTOS) 45 MG tablet Take 1 tablet (45 mg total) by mouth daily.  . RYBELSUS 3 MG TABS Take 1 tablet by mouth every morning.  . sitaGLIPtin (JANUVIA) 100 MG  tablet Take 1 tablet (100 mg total) by mouth daily.  Marland Kitchen spironolactone (ALDACTONE) 25 MG tablet Take 1 tablet (25 mg total) by mouth daily.  . SUMAtriptan (IMITREX) 50 MG tablet Take 1 tablet (50 mg total) by mouth every 2 (two) hours as needed for migraine. No more than $RemoveB'200mg'[id number]$  in 24 hours  . terconazole (TERAZOL 7) 0.4 % vaginal cream Apply topically to external vaginal tissue at bedtime for itching  . verapamil (CALAN) 80 MG tablet TAKE 1 TABLET BY MOUTH  TWICE DAILY   No facility-administered encounter medications on file as of 05/14/2020.    Current Diagnosis: Lloyd Assistance Coordination   Follow-Up:  Pharmacy Review Mailed application for Rybeksus Lloyd assistance to Lloyd. Lloyd to complete and return it and any needed supporting documents to office so that Mar Daring, PA-C can provide prescription for application. Lloyd may contact me if any questions, phone number provided.  Doristine Counter, [place], [place] Pharmacist Assistant [phone]

## 2020-05-14 NOTE — Telephone Encounter (Signed)
Pt wants appt for covid booster. Thanks!

## 2020-05-14 NOTE — Telephone Encounter (Signed)
Patient called for her [place] results: advised normal. Patient is requesting booster vaccine for COVID- call to office- they request a note and they will call patient back. Contact number: [phone]. Patient also wants to schedule flu shot for her husband: Alexis Lloyd PQ#[phone].

## 2020-05-15 ENCOUNTER — Telehealth: Payer: Self-pay | Admitting: *Deleted

## 2020-05-15 NOTE — Chronic Care Management (AMB) (Signed)
  Chronic Care Management   Note  05/15/2020 Name: Alexis Lloyd MRN: [id number] DOB: 06-22-1960  EMMANUELLA MIRANTE is a 59 y.o. year old female who is a primary care patient of Rubye Beach. I reached out to [name] by phone today in response to a referral sent by Ms. Shalicia D Ribaudo's PCP, Mar Daring, PA-C.     Ms. Kirshner was given information about Chronic Care Management services today including:  1. CCM service includes personalized support from designated clinical staff supervised by her physician, including individualized plan of care and coordination with other care providers 2. 24/7 contact phone numbers for assistance for urgent and routine care needs. 3. Service will only be billed when office clinical staff spend 20 minutes or more in a month to coordinate care. 4. Only one practitioner may furnish and bill the service in a calendar month. 5. The patient may stop CCM services at any time (effective at the end of the month) by phone call to the office staff. 6. The patient will be responsible for cost sharing (co-pay) of up to 20% of the service fee (after annual deductible is met).  Patient did not agree to enrollment in care management services and does not wish to consider at this time.  Follow up plan: Patient declines engagement by the care management team. Appropriate care team members and provider have been notified via electronic communication. The care management team is available to follow up with the patient after provider conversation with the patient regarding recommendation for care management engagement and subsequent re-referral to the care management team.   [place] [facility]  Direct Dial: [phone]

## 2020-05-16 ENCOUNTER — Other Ambulatory Visit: Payer: Self-pay | Admitting: Physician Assistant

## 2020-05-16 DIAGNOSIS — J4520 Mild intermittent asthma, uncomplicated: Secondary | ICD-10-CM

## 2020-05-30 ENCOUNTER — Ambulatory Visit: Payer: Medicare Other

## 2020-05-31 ENCOUNTER — Other Ambulatory Visit: Payer: Self-pay | Admitting: Physician Assistant

## 2020-05-31 DIAGNOSIS — IMO0002 Reserved for concepts with insufficient information to code with codable children: Secondary | ICD-10-CM

## 2020-05-31 DIAGNOSIS — E1140 Type 2 diabetes mellitus with diabetic neuropathy, unspecified: Secondary | ICD-10-CM

## 2020-06-10 ENCOUNTER — Other Ambulatory Visit: Payer: Self-pay

## 2020-06-10 ENCOUNTER — Emergency Department: Payer: Medicare Other

## 2020-06-10 ENCOUNTER — Inpatient Hospital Stay
Admission: EM | Admit: 2020-06-10 | Discharge: 2020-06-15 | DRG: 177 | Disposition: A | Discharge status: Home / Self Care | Payer: Medicare Other | Attending: Internal Medicine | Admitting: Internal Medicine

## 2020-06-10 DIAGNOSIS — E1165 Type 2 diabetes mellitus with hyperglycemia: Secondary | ICD-10-CM | POA: Diagnosis not present

## 2020-06-10 DIAGNOSIS — K219 Gastro-esophageal reflux disease without esophagitis: Secondary | ICD-10-CM | POA: Diagnosis not present

## 2020-06-10 DIAGNOSIS — R000 Tachycardia, unspecified: Secondary | ICD-10-CM | POA: Diagnosis not present

## 2020-06-10 DIAGNOSIS — K760 Fatty (change of) liver, not elsewhere classified: Secondary | ICD-10-CM | POA: Diagnosis present

## 2020-06-10 DIAGNOSIS — Z882 Allergy status to sulfonamides status: Secondary | ICD-10-CM

## 2020-06-10 DIAGNOSIS — Z825 Family history of asthma and other chronic lower respiratory diseases: Secondary | ICD-10-CM

## 2020-06-10 DIAGNOSIS — E7800 Pure hypercholesterolemia, unspecified: Secondary | ICD-10-CM | POA: Diagnosis not present

## 2020-06-10 DIAGNOSIS — Z87891 Personal history of nicotine dependence: Secondary | ICD-10-CM

## 2020-06-10 DIAGNOSIS — J45909 Unspecified asthma, uncomplicated: Secondary | ICD-10-CM | POA: Diagnosis not present

## 2020-06-10 DIAGNOSIS — Z9049 Acquired absence of other specified parts of digestive tract: Secondary | ICD-10-CM

## 2020-06-10 DIAGNOSIS — R32 Unspecified urinary incontinence: Secondary | ICD-10-CM | POA: Diagnosis present

## 2020-06-10 DIAGNOSIS — A4189 Other specified sepsis: Secondary | ICD-10-CM | POA: Diagnosis not present

## 2020-06-10 DIAGNOSIS — G4730 Sleep apnea, unspecified: Secondary | ICD-10-CM | POA: Diagnosis present

## 2020-06-10 DIAGNOSIS — R911 Solitary pulmonary nodule: Secondary | ICD-10-CM | POA: Diagnosis not present

## 2020-06-10 DIAGNOSIS — J9601 Acute respiratory failure with hypoxia: Secondary | ICD-10-CM | POA: Diagnosis present

## 2020-06-10 DIAGNOSIS — I700 Atherosclerosis of aorta: Secondary | ICD-10-CM | POA: Diagnosis not present

## 2020-06-10 DIAGNOSIS — R52 Pain, unspecified: Secondary | ICD-10-CM

## 2020-06-10 DIAGNOSIS — Z853 Personal history of malignant neoplasm of breast: Secondary | ICD-10-CM

## 2020-06-10 DIAGNOSIS — R1084 Generalized abdominal pain: Secondary | ICD-10-CM

## 2020-06-10 DIAGNOSIS — E878 Other disorders of electrolyte and fluid balance, not elsewhere classified: Secondary | ICD-10-CM | POA: Diagnosis not present

## 2020-06-10 DIAGNOSIS — M810 Age-related osteoporosis without current pathological fracture: Secondary | ICD-10-CM | POA: Diagnosis present

## 2020-06-10 DIAGNOSIS — E876 Hypokalemia: Secondary | ICD-10-CM | POA: Diagnosis present

## 2020-06-10 DIAGNOSIS — E871 Hypo-osmolality and hyponatremia: Secondary | ICD-10-CM | POA: Diagnosis present

## 2020-06-10 DIAGNOSIS — J1282 Pneumonia due to coronavirus disease 2019: Secondary | ICD-10-CM | POA: Diagnosis not present

## 2020-06-10 DIAGNOSIS — E1143 Type 2 diabetes mellitus with diabetic autonomic (poly)neuropathy: Secondary | ICD-10-CM | POA: Diagnosis not present

## 2020-06-10 DIAGNOSIS — R3915 Urgency of urination: Secondary | ICD-10-CM | POA: Diagnosis not present

## 2020-06-10 DIAGNOSIS — R059 Cough, unspecified: Secondary | ICD-10-CM

## 2020-06-10 DIAGNOSIS — Z923 Personal history of irradiation: Secondary | ICD-10-CM

## 2020-06-10 DIAGNOSIS — E785 Hyperlipidemia, unspecified: Secondary | ICD-10-CM | POA: Diagnosis present

## 2020-06-10 DIAGNOSIS — E669 Obesity, unspecified: Secondary | ICD-10-CM | POA: Diagnosis present

## 2020-06-10 DIAGNOSIS — Z7984 Long term (current) use of oral hypoglycemic drugs: Secondary | ICD-10-CM

## 2020-06-10 DIAGNOSIS — Z6838 Body mass index (BMI) 38.0-38.9, adult: Secondary | ICD-10-CM

## 2020-06-10 DIAGNOSIS — U071 COVID-19: Principal | ICD-10-CM

## 2020-06-10 DIAGNOSIS — G4733 Obstructive sleep apnea (adult) (pediatric): Secondary | ICD-10-CM | POA: Diagnosis not present

## 2020-06-10 DIAGNOSIS — E11649 Type 2 diabetes mellitus with hypoglycemia without coma: Secondary | ICD-10-CM | POA: Diagnosis present

## 2020-06-10 DIAGNOSIS — E1159 Type 2 diabetes mellitus with other circulatory complications: Secondary | ICD-10-CM | POA: Diagnosis present

## 2020-06-10 DIAGNOSIS — Z800 Family history of malignant neoplasm of digestive organs: Secondary | ICD-10-CM

## 2020-06-10 DIAGNOSIS — I10 Essential (primary) hypertension: Secondary | ICD-10-CM | POA: Diagnosis present

## 2020-06-10 DIAGNOSIS — Z87440 Personal history of urinary (tract) infections: Secondary | ICD-10-CM

## 2020-06-10 DIAGNOSIS — E1140 Type 2 diabetes mellitus with diabetic neuropathy, unspecified: Secondary | ICD-10-CM | POA: Diagnosis present

## 2020-06-10 DIAGNOSIS — Z91013 Allergy to seafood: Secondary | ICD-10-CM

## 2020-06-10 DIAGNOSIS — IMO0002 Reserved for concepts with insufficient information to code with codable children: Secondary | ICD-10-CM | POA: Diagnosis present

## 2020-06-10 DIAGNOSIS — N390 Urinary tract infection, site not specified: Secondary | ICD-10-CM

## 2020-06-10 DIAGNOSIS — Z79899 Other long term (current) drug therapy: Secondary | ICD-10-CM

## 2020-06-10 DIAGNOSIS — E66812 Obesity, class 2: Secondary | ICD-10-CM

## 2020-06-10 DIAGNOSIS — E119 Type 2 diabetes mellitus without complications: Secondary | ICD-10-CM

## 2020-06-10 DIAGNOSIS — Z8249 Family history of ischemic heart disease and other diseases of the circulatory system: Secondary | ICD-10-CM

## 2020-06-10 DIAGNOSIS — J302 Other seasonal allergic rhinitis: Secondary | ICD-10-CM | POA: Diagnosis not present

## 2020-06-10 DIAGNOSIS — I517 Cardiomegaly: Secondary | ICD-10-CM | POA: Diagnosis not present

## 2020-06-10 DIAGNOSIS — R509 Fever, unspecified: Secondary | ICD-10-CM | POA: Diagnosis not present

## 2020-06-10 DIAGNOSIS — E1142 Type 2 diabetes mellitus with diabetic polyneuropathy: Secondary | ICD-10-CM | POA: Diagnosis not present

## 2020-06-10 DIAGNOSIS — I152 Hypertension secondary to endocrine disorders: Secondary | ICD-10-CM | POA: Diagnosis present

## 2020-06-10 DIAGNOSIS — Z90710 Acquired absence of both cervix and uterus: Secondary | ICD-10-CM

## 2020-06-10 DIAGNOSIS — Z888 Allergy status to other drugs, medicaments and biological substances status: Secondary | ICD-10-CM

## 2020-06-10 DIAGNOSIS — T380X5A Adverse effect of glucocorticoids and synthetic analogues, initial encounter: Secondary | ICD-10-CM | POA: Diagnosis not present

## 2020-06-10 DIAGNOSIS — Z803 Family history of malignant neoplasm of breast: Secondary | ICD-10-CM

## 2020-06-10 LAB — CBC WITH DIFFERENTIAL/PLATELET
Abs Immature Granulocytes: 0.02 10*3/uL (ref 0.00–0.07)
Basophils Absolute: 0 10*3/uL (ref 0.0–0.1)
Basophils Relative: 0 %
Eosinophils Absolute: 0 10*3/uL (ref 0.0–0.5)
Eosinophils Relative: 0 %
HCT: 33.9 % — ABNORMAL LOW (ref 36.0–46.0)
Hemoglobin: 11.2 g/dL — ABNORMAL LOW (ref 12.0–15.0)
Immature Granulocytes: 0 %
Lymphocytes Relative: 15 %
Lymphs Abs: 1.6 10*3/uL (ref 0.7–4.0)
MCH: 29.1 pg (ref 26.0–34.0)
MCHC: 33 g/dL (ref 30.0–36.0)
MCV: 88.1 fL (ref 80.0–100.0)
Monocytes Absolute: 0.7 10*3/uL (ref 0.1–1.0)
Monocytes Relative: 6 %
Neutro Abs: 8.4 10*3/uL — ABNORMAL HIGH (ref 1.7–7.7)
Neutrophils Relative %: 79 %
Platelets: 308 10*3/uL (ref 150–400)
RBC: 3.85 MIL/uL — ABNORMAL LOW (ref 3.87–5.11)
RDW: 13.7 % (ref 11.5–15.5)
WBC: 10.7 10*3/uL — ABNORMAL HIGH (ref 4.0–10.5)
nRBC: 0 % (ref 0.0–0.2)

## 2020-06-10 LAB — RESP PANEL BY RT-PCR (FLU A&B, COVID) ARPGX2
Influenza A by PCR: NEGATIVE
Influenza B by PCR: NEGATIVE
SARS Coronavirus 2 by RT PCR: POSITIVE — AB

## 2020-06-10 LAB — COMPREHENSIVE METABOLIC PANEL
ALT: 27 U/L (ref 0–44)
AST: 19 U/L (ref 15–41)
Albumin: 3.7 g/dL (ref 3.5–5.0)
Alkaline Phosphatase: 68 U/L (ref 38–126)
Anion gap: 10 (ref 5–15)
BUN: 18 mg/dL (ref 8–23)
CO2: 27 mmol/L (ref 22–32)
Calcium: 9.4 mg/dL (ref 8.9–10.3)
Chloride: 96 mmol/L — ABNORMAL LOW (ref 98–111)
Creatinine, Ser: 0.94 mg/dL (ref 0.44–1.00)
GFR, Estimated: >60 mL/min (ref >60)
Glucose, Bld: 58 mg/dL — ABNORMAL LOW (ref 70–99)
Potassium: 3.3 mmol/L — ABNORMAL LOW (ref 3.5–5.1)
Sodium: 133 mmol/L — ABNORMAL LOW (ref 135–145)
Total Bilirubin: 0.8 mg/dL (ref 0.3–1.2)
Total Protein: 7.8 g/dL (ref 6.5–8.1)

## 2020-06-10 LAB — LACTIC ACID, PLASMA: Lactic Acid, Venous: 1.1 mmol/L (ref 0.5–1.9)

## 2020-06-10 MED ORDER — ACETAMINOPHEN 325 MG PO TABS
ORAL_TABLET | ORAL | Status: AC
  Filled 2020-06-10: qty 2

## 2020-06-10 MED ORDER — SODIUM CHLORIDE 0.9 % IV SOLN
200.0000 mg | Freq: Once | INTRAVENOUS | Status: AC
  Administered 2020-06-11: 200 mg via INTRAVENOUS
  Filled 2020-06-10: qty 40

## 2020-06-10 MED ORDER — IOHEXOL 350 MG/ML SOLN
100.0000 mL | Freq: Once | INTRAVENOUS | Status: AC | PRN
  Administered 2020-06-10: 100 mL via INTRAVENOUS

## 2020-06-10 MED ORDER — SODIUM CHLORIDE 0.9 % IV SOLN
100.0000 mg | Freq: Every day | INTRAVENOUS | Status: AC
  Administered 2020-06-12 – 2020-06-15 (×4): 100 mg via INTRAVENOUS
  Filled 2020-06-10: qty 20
  Filled 2020-06-10: qty 100
  Filled 2020-06-10 (×2): qty 20

## 2020-06-10 MED ORDER — ONDANSETRON HCL 4 MG/2ML IJ SOLN: 4.0000 mg | Freq: Four times a day (QID) | INTRAMUSCULAR | Status: DC | PRN

## 2020-06-10 MED ORDER — DEXAMETHASONE SODIUM PHOSPHATE 10 MG/ML IJ SOLN
10.0000 mg | Freq: Once | INTRAMUSCULAR | Status: AC
  Administered 2020-06-10: 10 mg via INTRAVENOUS
  Filled 2020-06-10: qty 1

## 2020-06-10 MED ORDER — ACETAMINOPHEN 325 MG PO TABS
650.0000 mg | ORAL_TABLET | Freq: Once | ORAL | Status: AC | PRN
  Administered 2020-06-10: 650 mg via ORAL

## 2020-06-10 MED ORDER — SODIUM CHLORIDE 0.9 % IV BOLUS
1000.0000 mL | Freq: Once | INTRAVENOUS | Status: AC
  Administered 2020-06-10: 1000 mL via INTRAVENOUS

## 2020-06-10 NOTE — Progress Notes (Signed)
Remdesivir - Pharmacy Brief Note   O:   CXR: "Possible right perihilar infiltrate on the frontal view not confirmed on the lateral view. Left lung clear." SpO2: 95-100%    A/P:  Remdesivir 200 mg IVPB once followed by 100 mg IVPB daily x 4 days.   Otelia Sergeant, PharmD, [facility] 06/10/2020 11:07 PM

## 2020-06-10 NOTE — ED Provider Notes (Signed)
[facility] Emergency Department Provider Note   ____________________________________________   Event Date/Time   First MD Initiated Contact with Patient 06/10/20 1939     (approximate)  I have reviewed the triage vital signs and the nursing notes.   HISTORY  Chief Complaint Abdominal Pain, Cough, Fever, and Weakness    HPI Alexis Lloyd is a 60 y.o. female with a stated past medical history of hypertension, type 2 diabetes, and GERD who presents for body aches, fever, cough, and abdominal pain that began approximately 24 hours prior to arrival.  Patient describes generalized, aching/cramping, 4/10, nonradiating abdominal pain that is partially worse with movement.  Patient also describes cough urinary incontinence that began yesterday as well.  Patient denies any sick contacts.  Patient does endorse significant shortness of breath as well.  Patient currently denies any vision changes, tinnitus, difficulty speaking, facial droop, sore throat, chest pain, nausea/vomiting/diarrhea, dysuria, or weakness/numbness/paresthesias in any extremity         Past Medical History:  Diagnosis Date  . Allergy   . Arthritis   . Breast cancer ([place])   . Breast cancer, left breast ([place]) 10/04/2014  . Diabetes mellitus without complication ([place])   . GERD (gastroesophageal reflux disease)   . Hyperlipidemia   . Hypertension   . Osteoporosis   . Sleep apnea     Patient Active Problem List   Diagnosis Date Noted  . Pain due to onychomycosis of toenails of both feet 10/24/2019  . Benign skin lesion 10/24/2019  . Recurrent UTI 01/22/2018  . Recurrent vaginitis 01/22/2018  . History of Helicobacter pylori infection 11/05/2017  . Intrinsic eczema 11/05/2017  . CLE (columnar lined esophagus)   . Stomach irritation   . Gastric polyp   . Problems with swallowing and mastication   . Heartburn   . Benign neoplasm of transverse colon   . Special screening for malignant  neoplasms, colon   . Internal hemorrhoids   . Diverticulosis of large intestine without diverticulitis   . Warts of foot 06/30/2017  . Achilles tendon contracture 01/28/2017  . Hallux rigidus of left foot 01/28/2017  . Hallux rigidus, right foot 01/28/2017  . Pes planus 01/28/2017  . Posterior tibial tendinitis of left lower extremity 01/28/2017  . Posterior tibial tendinitis of right lower extremity 01/28/2017  . Asthma 09/03/2015  . Foot pain 07/16/2015  . DM type 2, uncontrolled, with neuropathy ([place]) 07/13/2015  . Abnormal finding on mammography, microcalcification 04/25/2015  . Intraductal carcinoma of breast 04/24/2015  . Airway hyperreactivity 11/18/2014  . BMI 40.0-44.9, adult ([place]) 11/18/2014  . Breast cyst 11/18/2014  . Diabetic neuropathy ([place]) 11/18/2014  . GERD (gastroesophageal reflux disease) 11/18/2014  . 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 Hypercholesteremia 11/18/2014  . Hypertension 11/18/2014  . Arthritis, degenerative 11/18/2014  . Allergic rhinitis, seasonal 11/18/2014  . OSA (obstructive sleep apnea) 11/18/2014  . AC (acromioclavicular) arthritis 11/08/2014  . Personal history of breast cancer 10/04/2014  . Primary osteoarthritis of right knee 11/14/2013    Past Surgical History:  Procedure Laterality Date  . ABDOMINAL HYSTERECTOMY     due to fibroid tumors 1990 Dr. Quenten Raven  . BREAST BIOPSY Left    negative  . BREAST BIOPSY Left    04/2013 positive  . BREAST CYST ASPIRATION Left   . BREAST EXCISIONAL BIOPSY Left    2015  . BREAST LUMPECTOMY Left 06/17/2013  . BREAST REDUCTION SURGERY Bilateral   . CESAREAN SECTION     x's 2  .  CHOLECYSTECTOMY  09/2001  . COLONOSCOPY WITH PROPOFOL N/A 09/11/2017   Procedure: COLONOSCOPY WITH PROPOFOL;  Surgeon: Tahiliani, Varnita B, MD;  Location: [facility] ENDOSCOPY;  Service: Endoscopy;  Laterality: N/A;  . ESOPHAGOGASTRODUODENOSCOPY (EGD) WITH PROPOFOL N/A 09/11/2017   Procedure: ESOPHAGOGASTRODUODENOSCOPY (EGD) WITH  PROPOFOL;  Surgeon: Tahiliani, Varnita B, MD;  Location: [facility] ENDOSCOPY;  Service: Endoscopy;  Laterality: N/A;  . REDUCTION MAMMAPLASTY    . s/p radiation therapy Left    2015    Prior to Admission medications   Medication Sig Start Date End Date Taking? Authorizing Provider  albuterol (PROVENTIL HFA) 108 (90 Base) MCG/ACT inhaler Inhale 2 puffs into the lungs every 6 (six) hours as needed. 02/17/20   Burnette, Jennifer M, PA-C  Blood Glucose Monitoring Suppl (ONETOUCH VERIO) w/Device KIT To check blood sugar once daily 01/05/19   Burnette, Jennifer M, PA-C  cetirizine (ZYRTEC) 10 MG tablet Take 1 tablet (10 mg total) by mouth daily. 07/24/15   Maloney, Nancy, MD  glipiZIDE (GLUCOTROL) 10 MG tablet TAKE 1 TABLET BY MOUTH  TWICE DAILY BEFORE A MEAL 06/01/20   Burnette, Jennifer M, PA-C  glucose blood (ONETOUCH VERIO) test strip To check blood sugar once daily 01/13/20   Burnette, Jennifer M, PA-C  ketoconazole (NIZORAL) 2 % cream APPLY CREAM TOPICALLY TWICE DAILY TO FEET 02/23/18   [provider]  Lancets (ONETOUCH ULTRASOFT) lancets To check BS once daily 01/16/20   Burnette, Jennifer M, PA-C  lisinopril-hydrochlorothiazide (ZESTORETIC) 20-12.5 MG tablet TAKE 2 TABLETS BY MOUTH  DAILY 02/16/20   Burnette, Jennifer M, PA-C  montelukast (SINGULAIR) 10 MG tablet TAKE 1 TABLET BY MOUTH  DAILY 11/15/19   Burnette, Jennifer M, PA-C  pantoprazole (PROTONIX) 40 MG tablet Take 1 tablet (40 mg total) by mouth daily. 05/07/20   Burnette, Jennifer M, PA-C  pioglitazone (ACTOS) 45 MG tablet Take 1 tablet (45 mg total) by mouth daily. 05/07/20   Burnette, Jennifer M, PA-C  RYBELSUS 3 MG TABS Take 1 tablet by mouth every morning. 02/22/20   Burnette, Jennifer M, PA-C  sitaGLIPtin (JANUVIA) 100 MG tablet Take 1 tablet (100 mg total) by mouth daily. 08/25/18   Burnette, Jennifer M, PA-C  spironolactone (ALDACTONE) 25 MG tablet Take 1 tablet (25 mg total) by mouth daily. 04/25/20   Burnette, Jennifer M, PA-C   SUMAtriptan (IMITREX) 50 MG tablet Take 1 tablet (50 mg total) by mouth every 2 (two) hours as needed for migraine. No more than 200mg in 24 hours 07/07/19   Burnette, Jennifer M, PA-C  terconazole (TERAZOL 7) 0.4 % vaginal cream Apply topically to external vaginal tissue at bedtime for itching 10/17/19   Fryer, Margaret M, CNM  verapamil (CALAN) 80 MG tablet TAKE 1 TABLET BY MOUTH  TWICE DAILY 08/01/19   Burnette, Jennifer M, PA-C    Allergies Allegra [fexofenadine], Avelox [moxifloxacin], Bactrim [sulfamethoxazole-trimethoprim], Etodolac, Fish-derived products, Penicillins, Shellfish allergy, Sulfa antibiotics, and Macrobid [nitrofurantoin monohyd macro]  Family History  Problem Relation Age of Onset  . Hypertension Mother   . Cancer Mother        breast and rectal cancer  . Breast cancer Mother 60  . Asthma Brother   . Hypertension Brother   . Allergic rhinitis Brother   . Breast cancer Maternal Grandmother     Social History Social History   Tobacco Use  . Smoking status: Former Smoker    Packs/day: 0.25    Years: 21.00    Pack years: 5.25    Quit date: 06/08/1997      Years since quitting: 23.0  . Smokeless tobacco: Never Used  Vaping Use  . Vaping Use: Never used  Substance Use Topics  . Alcohol use: No  . Drug use: No    Review of Systems Constitutional: Endorses fever/chills Eyes: No visual changes. ENT: No sore throat. Cardiovascular: Denies chest pain. Respiratory: Endorses shortness of breath. Gastrointestinal: Endorses abdominal pain.  No nausea, no vomiting.  No diarrhea. Genitourinary: Negative for dysuria. Musculoskeletal: Negative for acute arthralgias Skin: Negative for rash. Neurological: Negative for headaches, weakness/numbness/paresthesias in any extremity Psychiatric: Negative for suicidal ideation/homicidal ideation   ____________________________________________   PHYSICAL EXAM:  VITAL SIGNS: ED Triage Vitals  Enc Vitals Group     BP  06/10/20 1746 (!) 114/37     Pulse Rate 06/10/20 1746 (!) 123     Resp 06/10/20 1746 20     Temp 06/10/20 1746 (!) 103.3 F (39.6 C)     Temp Source 06/10/20 1746 Oral     SpO2 06/10/20 1746 99 %     Weight 06/10/20 1747 223 lb (101.2 kg)     Height 06/10/20 1747 5' 4" (1.626 m)     Head Circumference --      Peak Flow --      Pain Score 06/10/20 1746 8     Pain Loc --      Pain Edu? --      Excl. in [place]? --    Constitutional: Alert and oriented. Well appearing and in no acute distress. Eyes: Conjunctivae are normal. PERRL. Head: Atraumatic. Nose: No congestion/rhinnorhea. Mouth/Throat: Mucous membranes are moist. Neck: No stridor Cardiovascular: Grossly normal heart sounds.  Good peripheral circulation. Respiratory: Tachypneic.  2 L nasal cannula in place.  Clear to auscultation bilaterally.  Normal respiratory effort.  No retractions. Gastrointestinal: Soft and nontender. No distention. Musculoskeletal: No obvious deformities Neurologic:  Normal speech and language. No gross focal neurologic deficits are appreciated. Skin:  Skin is warm and dry. No rash noted. Psychiatric: Mood and affect are normal. Speech and behavior are normal.  ____________________________________________   LABS (all labs ordered are listed, but only abnormal results are displayed)  Labs Reviewed  RESP PANEL BY RT-PCR (FLU A&B, COVID) ARPGX2 - Abnormal; Notable for the following components:      Result Value   SARS Coronavirus 2 by RT PCR POSITIVE (*)    All other components within normal limits  COMPREHENSIVE METABOLIC PANEL - Abnormal; Notable for the following components:   Sodium 133 (*)    Potassium 3.3 (*)    Chloride 96 (*)    Glucose, Bld 58 (*)    All other components within normal limits  CBC WITH DIFFERENTIAL/PLATELET - Abnormal; Notable for the following components:   WBC 10.7 (*)    RBC 3.85 (*)    Hemoglobin 11.2 (*)    HCT 33.9 (*)    Neutro Abs 8.4 (*)    All other components  within normal limits  LACTIC ACID, PLASMA  URINALYSIS, COMPLETE (UACMP) WITH MICROSCOPIC   ____________________________________________  EKG  ED ECG REPORT I, Naaman Plummer, the attending physician, personally viewed and interpreted this ECG.  Date: 06/10/2020 EKG Time: 1744 Rate: 119 Rhythm: Tachycardic sinus rhythm QRS Axis: normal Intervals: normal ST/T Wave abnormalities: normal Narrative Interpretation: no evidence of acute ischemia  ____________________________________________  RADIOLOGY  ED MD interpretation: 2 view x-ray of the chest shows possible right perihilar infiltrate without evidence of pneumothorax or widened mediastinum  CT angiography of the chest shows no  evidence of acute pulmonary embolism or aortic dissection but does show some pulmonary nodules  CT of the abdomen and pelvis with IV contrast shows no evidence of acute abnormalities including no significant amounts of free fluid, free air, or signs of significant infection  Official radiology report(s): DG Chest 2 View  Result Date: 06/10/2020 CLINICAL DATA:  Fever EXAM: CHEST - 2 VIEW COMPARISON:  08/02/2016 FINDINGS: Suboptimal image quality. Possible right perihilar infiltrate on the frontal view not confirmed on the lateral view. No heart failure or effusion. Left lung clear. IMPRESSION: Suboptimal quality study. Possible right perihilar infiltrate. Recommend follow-up chest x-ray to confirm. Electronically Signed   By: Franchot Gallo M.D.   On: 06/10/2020 18:32   CT Angio Chest PE W/Cm &/Or Wo Cm  Result Date: 06/10/2020 CLINICAL DATA:  Body aches fever cough EXAM: CT ANGIOGRAPHY CHEST WITH CONTRAST TECHNIQUE: Multidetector CT imaging of the chest was performed using the standard protocol during bolus administration of intravenous contrast. Multiplanar CT image reconstructions and MIPs were obtained to evaluate the vascular anatomy. CONTRAST:  13m OMNIPAQUE IOHEXOL 350 MG/ML SOLN COMPARISON:  Chest  x-ray 06/10/2020, CT 02/21/2018 FINDINGS: Cardiovascular: Satisfactory opacification of the pulmonary arteries to the segmental level. No evidence of pulmonary embolism. Nonaneurysmal aorta. Mild aortic atherosclerosis. Borderline to mild cardiomegaly. No pericardial effusion Mediastinum/Nodes: Midline trachea. No thyroid mass. Subcentimeter mediastinal lymph nodes. Esophagus within normal limits. Stable cardio phrenic lymph nodes. Lungs/Pleura: No acute consolidation or pleural effusion. No pneumothorax. Right middle lobe lung nodule adjacent to the fissure, series 4, image number 40. Subpleural left lower lobe pulmonary nodule measuring 7 mm on average, series 4, image number 44, may be slightly increased in size but was incompletely visualized. Upper Abdomen: Hepatic steatosis.  No acute abnormality. Musculoskeletal: Mild mild diffuse increased bone density without acute osseous abnormality. Review of the MIP images confirms the above findings. IMPRESSION: 1. Negative for acute pulmonary embolus or aortic dissection. Clear lung fields. 2. Pulmonary nodules measuring up to 7 mm in the left lower lobe. Non-contrast chest CT at 3-6 months is recommended. If the nodules are stable at time of repeat CT, then future CT at 18-24 months (from today's scan) is considered optional for low-risk patients, but is recommended for high-risk patients. This recommendation follows the consensus statement: Guidelines for Management of Incidental Pulmonary Nodules Detected on CT Images: From the Fleischner Society 2017; Radiology 2017; 284:228-243. 3. Hepatic steatosis Aortic Atherosclerosis (ICD10-I70.0). Electronically Signed   By: KDonavan FoilM.D.   On: 06/10/2020 22:08   CT Abdomen Pelvis W Contrast  Result Date: 06/10/2020 CLINICAL DATA:  Abdomen pain and fever EXAM: CT ABDOMEN AND PELVIS WITH CONTRAST TECHNIQUE: Multidetector CT imaging of the abdomen and pelvis was performed using the standard protocol following bolus  administration of intravenous contrast. CONTRAST:  1026mOMNIPAQUE IOHEXOL 350 MG/ML SOLN COMPARISON:  CT 02/21/2018 FINDINGS: Lower chest: Lung bases demonstrate dependent atelectasis or scarring. No acute consolidation or pleural effusion. Mild cardiomegaly. Hepatobiliary: Hepatic steatosis. Status post cholecystectomy. No biliary dilatation. Pancreas: Unremarkable. No pancreatic ductal dilatation or surrounding inflammatory changes. Spleen: Normal in size without focal abnormality. Adrenals/Urinary Tract: Adrenal glands are unremarkable. Kidneys are normal, without renal calculi, focal lesion, or hydronephrosis. Bladder is unremarkable. Stomach/Bowel: Stomach is within normal limits. Appendix appears normal. No evidence of bowel wall thickening, distention, or inflammatory changes. Vascular/Lymphatic: Mild aortic atherosclerosis. No aneurysm. No suspicious nodes Reproductive: Status post hysterectomy. No adnexal masses. Other: Negative for free air or free fluid. Musculoskeletal: No acute  or significant osseous findings. IMPRESSION: 1. No CT evidence for acute intra-abdominal or pelvic abnormality. 2. Hepatic steatosis. Aortic Atherosclerosis (ICD10-I70.0). Electronically Signed   By: Kim  Fujinaga M.D.   On: 06/10/2020 21:59    ____________________________________________   PROCEDURES  Procedure(s) performed (including Critical Care):  .1-3 Lead EKG Interpretation Performed by: Bradler, Evan K, MD Authorized by: Bradler, Evan K, MD     Interpretation: abnormal     ECG rate:  116   ECG rate assessment: tachycardic     Rhythm: sinus tachycardia     Ectopy: none     Conduction: normal   .Critical Care Performed by: Bradler, Evan K, MD Authorized by: Bradler, Evan K, MD   Critical care provider statement:    Critical care time (minutes):  35   Critical care time was exclusive of:  Separately billable procedures and treating other patients   Critical care was necessary to treat or prevent  imminent or life-threatening deterioration of the following conditions:  Respiratory failure   Critical care was time spent personally by me on the following activities:  Discussions with consultants, evaluation of patient's response to treatment, examination of patient, ordering and performing treatments and interventions, ordering and review of laboratory studies, ordering and review of radiographic studies, pulse oximetry, re-evaluation of patient's condition, obtaining history from patient or surrogate and review of old charts   I assumed direction of critical care for this patient from another provider in my specialty: no     Care discussed with: admitting provider       ____________________________________________   INITIAL IMPRESSION / ASSESSMENT AND PLAN / ED COURSE  As part of my medical decision making, I reviewed the following data within the electronic medical record:  Nursing notes reviewed and incorporated, Labs reviewed, EKG interpreted, Old chart reviewed, Radiograph reviewed and Notes from prior ED visits reviewed and incorporated        Presentation most consistent with Viral Syndrome.  Patient has tested positive for COVID-19. At this time patient is requiring submental oxygenation due to acute hypoxic respiratory failure.  Given History and Exam I have a lower suspicion for: Emergent CardioPulmonary causes [such as Acute Asthma or COPD Exacerbation, acute Heart Failure or exacerbation, PE, PTX, atypical ACS, PNA]. Emergent Otolaryngeal causes [such as PTA, RPA, [place], Epiglottitis, EBV].  Regarding Emergent Travel or Immunosuppressive related infectious: I have a low suspicion for acute HIV.  Given radiologic evidence for patchy bilateral airspace opacities concerning for viral pneumonia, continued need for supplemental oxygenation due to acute hypoxic respiratory failure, and need for further evaluation and management, patient will require admission       ____________________________________________   FINAL CLINICAL IMPRESSION(S) / ED DIAGNOSES  Final diagnoses:  COVID-19 virus infection  Fever, unspecified fever cause  Generalized abdominal pain  Cough     ED Discharge Orders    None       Note:  This document was prepared using Dragon voice recognition software and may include unintentional dictation errors.   Bradler, Evan K, MD 06/10/20 2257  

## 2020-06-10 NOTE — ED Notes (Signed)
Date and time results received: 06/10/20 2113 (use smartphrase ".now" to insert current time)  Test: Covid-19 Critical Value: Positive  Name of Provider Notified: dr. Vicente Males  Orders Received? Or Actions Taken?: provider notified

## 2020-06-10 NOTE — ED Notes (Signed)
Provider aware of BP

## 2020-06-10 NOTE — ED Notes (Signed)
ER provider notified that RN was unable to get IV access and that pt was placed on oxygen. Provider notified of pts low blood pressure.

## 2020-06-10 NOTE — ED Notes (Signed)
IV team at bedside. RN to start NS bolus after IV is established.

## 2020-06-10 NOTE — ED Triage Notes (Signed)
Reports body aches, fever, cough, abdominal pain and urinary incontinence that started yesterday. Denies SOB.  Pt alert and oriented X 4.

## 2020-06-10 NOTE — ED Notes (Signed)
Pt up and walked to bathroom in room with no assistance. Pt stable on feet. Pt back in bed with cardiac, bp and pulse ox monitor on. RN was unable to obtain urine sample before pt urinated in toilet

## 2020-06-10 NOTE — ED Notes (Signed)
Pt states coming in for chest pain and a cough that started yesterday. Pts oxygen noted to be 88% on room air. Pt placed on 2lpm via [place] of oxygen.  Pt on cardiac, bp and pulse ox monitor.  RN attempted to get IV x3 to the right arm. RN was unable to get IV. Bleeding controlled and catheters intact.

## 2020-06-11 DIAGNOSIS — E66812 Obesity, class 2: Secondary | ICD-10-CM

## 2020-06-11 DIAGNOSIS — I10 Essential (primary) hypertension: Secondary | ICD-10-CM

## 2020-06-11 DIAGNOSIS — E1142 Type 2 diabetes mellitus with diabetic polyneuropathy: Secondary | ICD-10-CM

## 2020-06-11 DIAGNOSIS — E669 Obesity, unspecified: Secondary | ICD-10-CM | POA: Diagnosis not present

## 2020-06-11 DIAGNOSIS — J1282 Pneumonia due to coronavirus disease 2019: Secondary | ICD-10-CM

## 2020-06-11 DIAGNOSIS — E1140 Type 2 diabetes mellitus with diabetic neuropathy, unspecified: Secondary | ICD-10-CM | POA: Diagnosis not present

## 2020-06-11 DIAGNOSIS — G4733 Obstructive sleep apnea (adult) (pediatric): Secondary | ICD-10-CM

## 2020-06-11 DIAGNOSIS — E1143 Type 2 diabetes mellitus with diabetic autonomic (poly)neuropathy: Secondary | ICD-10-CM | POA: Diagnosis not present

## 2020-06-11 DIAGNOSIS — E119 Type 2 diabetes mellitus without complications: Secondary | ICD-10-CM

## 2020-06-11 DIAGNOSIS — U071 COVID-19: Secondary | ICD-10-CM | POA: Diagnosis not present

## 2020-06-11 DIAGNOSIS — A419 Sepsis, unspecified organism: Secondary | ICD-10-CM | POA: Insufficient documentation

## 2020-06-11 DIAGNOSIS — E1165 Type 2 diabetes mellitus with hyperglycemia: Secondary | ICD-10-CM

## 2020-06-11 DIAGNOSIS — E7800 Pure hypercholesterolemia, unspecified: Secondary | ICD-10-CM

## 2020-06-11 LAB — CBG MONITORING, ED
Glucose-Capillary: 149 mg/dL — ABNORMAL HIGH (ref 70–99)
Glucose-Capillary: 286 mg/dL — ABNORMAL HIGH (ref 70–99)
Glucose-Capillary: 199 mg/dL — ABNORMAL HIGH (ref 70–99)
Glucose-Capillary: 205 mg/dL — ABNORMAL HIGH (ref 70–99)

## 2020-06-11 LAB — URINALYSIS, COMPLETE (UACMP) WITH MICROSCOPIC
Bilirubin Urine: NEGATIVE
Glucose, UA: NEGATIVE mg/dL
Ketones, ur: NEGATIVE mg/dL
Leukocytes,Ua: NEGATIVE
Nitrite: POSITIVE — AB
Protein, ur: NEGATIVE mg/dL
Specific Gravity, Urine: 1.028 (ref 1.005–1.030)
pH: 5 (ref 5.0–8.0)

## 2020-06-11 LAB — HIV ANTIBODY (ROUTINE TESTING W REFLEX): HIV Screen 4th Generation wRfx: NONREACTIVE

## 2020-06-11 LAB — HEMOGLOBIN A1C
Hgb A1c MFr Bld: 7.6 % — ABNORMAL HIGH (ref 4.8–5.6)
Mean Plasma Glucose: 171.42 mg/dL

## 2020-06-11 MED ORDER — ENOXAPARIN SODIUM 60 MG/0.6ML ~~LOC~~ SOLN
0.5000 mg/kg | SUBCUTANEOUS | Status: DC
Start: 2020-06-11 — End: 2020-06-15
  Administered 2020-06-11 – 2020-06-15 (×5): 50 mg via SUBCUTANEOUS
  Filled 2020-06-11 (×5): qty 0.6

## 2020-06-11 MED ORDER — PIOGLITAZONE HCL 30 MG PO TABS
45.0000 mg | ORAL_TABLET | Freq: Every day | ORAL | Status: DC
  Administered 2020-06-11 – 2020-06-15 (×5): 45 mg via ORAL
  Filled 2020-06-11 (×6): qty 1

## 2020-06-11 MED ORDER — PANTOPRAZOLE SODIUM 40 MG PO TBEC
40.0000 mg | DELAYED_RELEASE_TABLET | Freq: Every day | ORAL | Status: DC
  Administered 2020-06-11 – 2020-06-15 (×5): 40 mg via ORAL
  Filled 2020-06-11 (×5): qty 1

## 2020-06-11 MED ORDER — ACETAMINOPHEN 325 MG PO TABS
650.0000 mg | ORAL_TABLET | Freq: Four times a day (QID) | ORAL | Status: DC | PRN
  Administered 2020-06-13: 650 mg via ORAL
  Filled 2020-06-11: qty 2

## 2020-06-11 MED ORDER — METHYLPREDNISOLONE SODIUM SUCC 125 MG IJ SOLR
1.0000 mg/kg | Freq: Two times a day (BID) | INTRAMUSCULAR | Status: DC
Start: 2020-06-11 — End: 2020-06-11
  Administered 2020-06-11: 101.25 mg via INTRAVENOUS
  Filled 2020-06-11: qty 2

## 2020-06-11 MED ORDER — ASCORBIC ACID 500 MG PO TABS
500.0000 mg | ORAL_TABLET | Freq: Every day | ORAL | Status: DC
  Administered 2020-06-11 – 2020-06-15 (×5): 500 mg via ORAL
  Filled 2020-06-11 (×5): qty 1

## 2020-06-11 MED ORDER — ONDANSETRON HCL 4 MG PO TABS: 4.0000 mg | ORAL_TABLET | Freq: Four times a day (QID) | ORAL | Status: DC | PRN

## 2020-06-11 MED ORDER — ASPIRIN EC 81 MG PO TBEC
81.0000 mg | DELAYED_RELEASE_TABLET | Freq: Every day | ORAL | Status: DC
  Administered 2020-06-11 – 2020-06-15 (×5): 81 mg via ORAL
  Filled 2020-06-11 (×5): qty 1

## 2020-06-11 MED ORDER — ZINC SULFATE 220 (50 ZN) MG PO CAPS
220.0000 mg | ORAL_CAPSULE | Freq: Every day | ORAL | Status: DC
  Administered 2020-06-11 – 2020-06-15 (×5): 220 mg via ORAL
  Filled 2020-06-11 (×5): qty 1

## 2020-06-11 MED ORDER — ALBUTEROL SULFATE HFA 108 (90 BASE) MCG/ACT IN AERS
2.0000 | INHALATION_SPRAY | RESPIRATORY_TRACT | Status: DC | PRN
  Filled 2020-06-11: qty 6.7

## 2020-06-11 MED ORDER — GUAIFENESIN-DM 100-10 MG/5ML PO SYRP: 10.0000 mL | ORAL_SOLUTION | ORAL | Status: DC | PRN

## 2020-06-11 MED ORDER — PREDNISONE 20 MG PO TABS: 50.0000 mg | ORAL_TABLET | Freq: Every day | ORAL | Status: DC

## 2020-06-11 MED ORDER — LINAGLIPTIN 5 MG PO TABS
5.0000 mg | ORAL_TABLET | Freq: Every day | ORAL | Status: DC
  Administered 2020-06-11 – 2020-06-15 (×5): 5 mg via ORAL
  Filled 2020-06-11 (×5): qty 1

## 2020-06-11 MED ORDER — VERAPAMIL HCL 80 MG PO TABS
80.0000 mg | ORAL_TABLET | Freq: Two times a day (BID) | ORAL | Status: DC
  Administered 2020-06-11: 80 mg via ORAL
  Filled 2020-06-11 (×2): qty 1

## 2020-06-11 MED ORDER — LISINOPRIL-HYDROCHLOROTHIAZIDE 20-12.5 MG PO TABS: 2.0000 | ORAL_TABLET | Freq: Every day | ORAL | Status: DC

## 2020-06-11 MED ORDER — SODIUM CHLORIDE 0.9 % IV SOLN
1.0000 g | INTRAVENOUS | Status: DC
  Administered 2020-06-11: 1 g via INTRAVENOUS
  Filled 2020-06-11: qty 10

## 2020-06-11 MED ORDER — SODIUM CHLORIDE 0.9 % IV SOLN: INTRAVENOUS | Status: DC

## 2020-06-11 MED ORDER — LORATADINE 10 MG PO TABS
10.0000 mg | ORAL_TABLET | Freq: Every day | ORAL | Status: DC
  Administered 2020-06-11 – 2020-06-15 (×5): 10 mg via ORAL
  Filled 2020-06-11 (×5): qty 1

## 2020-06-11 MED ORDER — HYDROCOD POLST-CPM POLST ER 10-8 MG/5ML PO SUER: 5.0000 mL | Freq: Two times a day (BID) | ORAL | Status: DC | PRN

## 2020-06-11 MED ORDER — FAMOTIDINE 20 MG PO TABS
20.0000 mg | ORAL_TABLET | Freq: Two times a day (BID) | ORAL | Status: DC
  Administered 2020-06-11 – 2020-06-13 (×6): 20 mg via ORAL
  Filled 2020-06-11 (×5): qty 1

## 2020-06-11 MED ORDER — BARICITINIB 2 MG PO TABS
4.0000 mg | ORAL_TABLET | Freq: Every day | ORAL | Status: DC
  Administered 2020-06-11: 4 mg via ORAL
  Filled 2020-06-11: qty 2

## 2020-06-11 MED ORDER — MONTELUKAST SODIUM 10 MG PO TABS
10.0000 mg | ORAL_TABLET | Freq: Every day | ORAL | Status: DC
  Administered 2020-06-11 – 2020-06-14 (×4): 10 mg via ORAL
  Filled 2020-06-11 (×5): qty 1

## 2020-06-11 MED ORDER — TRAZODONE HCL 50 MG PO TABS: 25.0000 mg | ORAL_TABLET | Freq: Every evening | ORAL | Status: DC | PRN

## 2020-06-11 MED ORDER — ONDANSETRON HCL 4 MG/2ML IJ SOLN: 4.0000 mg | Freq: Four times a day (QID) | INTRAMUSCULAR | Status: DC | PRN

## 2020-06-11 MED ORDER — LISINOPRIL 10 MG PO TABS
40.0000 mg | ORAL_TABLET | Freq: Every day | ORAL | Status: DC
  Administered 2020-06-11: 40 mg via ORAL
  Filled 2020-06-11: qty 4

## 2020-06-11 MED ORDER — ENOXAPARIN SODIUM 40 MG/0.4ML ~~LOC~~ SOLN: 40.0000 mg | SUBCUTANEOUS | Status: DC

## 2020-06-11 MED ORDER — GLIPIZIDE 10 MG PO TABS
10.0000 mg | ORAL_TABLET | Freq: Two times a day (BID) | ORAL | Status: DC
  Filled 2020-06-11: qty 1

## 2020-06-11 MED ORDER — SEMAGLUTIDE 3 MG PO TABS: 1.0000 | ORAL_TABLET | Freq: Every morning | ORAL | Status: DC

## 2020-06-11 MED ORDER — VITAMIN D 25 MCG (1000 UNIT) PO TABS
1000.0000 [IU] | ORAL_TABLET | Freq: Every day | ORAL | Status: DC
  Administered 2020-06-11 – 2020-06-15 (×5): 1000 [IU] via ORAL
  Filled 2020-06-11 (×5): qty 1

## 2020-06-11 MED ORDER — HYDROCOD POLST-CPM POLST ER 10-8 MG/5ML PO SUER
5.0000 mL | Freq: Two times a day (BID) | ORAL | Status: DC
Start: 2020-06-11 — End: 2020-06-15
  Administered 2020-06-11 – 2020-06-15 (×8): 5 mL via ORAL
  Filled 2020-06-11 (×8): qty 5

## 2020-06-11 MED ORDER — MAGNESIUM HYDROXIDE 400 MG/5ML PO SUSP
30.0000 mL | Freq: Every day | ORAL | Status: DC | PRN
Start: 2020-06-11 — End: 2020-06-15
  Filled 2020-06-11: qty 30

## 2020-06-11 MED ORDER — GUAIFENESIN ER 600 MG PO TB12
600.0000 mg | ORAL_TABLET | Freq: Two times a day (BID) | ORAL | Status: DC
  Administered 2020-06-11 – 2020-06-15 (×10): 600 mg via ORAL
  Filled 2020-06-11 (×10): qty 1

## 2020-06-11 MED ORDER — SUMATRIPTAN SUCCINATE 50 MG PO TABS
50.0000 mg | ORAL_TABLET | ORAL | Status: DC | PRN
  Filled 2020-06-11: qty 1

## 2020-06-11 MED ORDER — HYDROCHLOROTHIAZIDE 25 MG PO TABS: 25.0000 mg | ORAL_TABLET | Freq: Every day | ORAL | Status: DC

## 2020-06-11 MED ORDER — INSULIN ASPART 100 UNIT/ML ~~LOC~~ SOLN
0.0000 [IU] | Freq: Three times a day (TID) | SUBCUTANEOUS | Status: DC
  Administered 2020-06-11: 4 [IU] via SUBCUTANEOUS
  Administered 2020-06-11: 11 [IU] via SUBCUTANEOUS
  Administered 2020-06-11: 3 [IU] via SUBCUTANEOUS
  Administered 2020-06-12: 4 [IU] via SUBCUTANEOUS
  Administered 2020-06-12: 3 [IU] via SUBCUTANEOUS
  Administered 2020-06-12: 4 [IU] via SUBCUTANEOUS
  Administered 2020-06-13: 7 [IU] via SUBCUTANEOUS
  Administered 2020-06-13 – 2020-06-14 (×3): 4 [IU] via SUBCUTANEOUS
  Administered 2020-06-14: 7 [IU] via SUBCUTANEOUS
  Administered 2020-06-14: 4 [IU] via SUBCUTANEOUS
  Filled 2020-06-11 (×12): qty 1

## 2020-06-11 MED ORDER — SPIRONOLACTONE 25 MG PO TABS
25.0000 mg | ORAL_TABLET | Freq: Every day | ORAL | Status: DC
  Administered 2020-06-11: 25 mg via ORAL
  Filled 2020-06-11: qty 1

## 2020-06-11 NOTE — ED Notes (Signed)
Pt given meal tray at this time 

## 2020-06-11 NOTE — H&P (Signed)
PATIENT NAME: Alexis Lloyd    MR#:  [id number]  DATE OF BIRTH:  1960/11/29  DATE OF ADMISSION:  06/10/2020  PRIMARY CARE PHYSICIAN: Mar Daring, PA-C   REQUESTING/REFERRING PHYSICIAN: Naaman Plummer, MD CHIEF COMPLAINT:   Chief Complaint  Patient presents with  . Abdominal Pain  . Cough  . Fever  . Weakness    HISTORY OF PRESENT ILLNESS:  Alexis Lloyd  is a 60 y.o. African-American female with a known history of hypertension, dyslipidemia, type 2 diabetes mellitus and sleep apnea, who presented to the emergency room with acute onset of cough and wheezing since Saturday.  She admitted to fever and chills as well as loss of taste and smell, generalized weakness and lack of appetite.  No nausea or vomiting or diarrhea.  No chest pain or palpitations.  No headache or dizziness or blurred vision.  She has been vaccinated against Covid 19 on 2/21 and 09/13/2019.  She has been having urinary frequency and urgency with offensive urine order with lower abdominal pain.  Upon presentation to the ER, her temperature was 103.3 with blood pressure of 114/37 heart rate of 123 and respiratory rate was 20 with a pulse oximetry of 99% on 2 L of O2 by nasal cannula after dropping to the 80s on room air.  Labs revealed mild hyponatremia and hypochloremia, hypokalemia hypoglycemia 58 with otherwise normal CMP.  CBC showed WBC of 10.7 mild anemia.  COVID-19 PCR came back positive and influenza antigens were negative.  Urinalysis showed 6-10 WBCs with rare bacteria and positive nitrite.  Chest x-ray showed possible right perihilar infiltrate abdominal and pelvic CT scan showed hepatic steatosis with no acute abnormalities.  Chest CTA revealed no evidence for PE or aortic dissection and clear lung fields.  It showed however pulmonary nodules measuring up to 7 mm in the left lower lobe with recommendation for noncontrast chest CT at 3 to 6 months and if the nodules are stable then  future CT at 18 to 24 months.  It also showed aortic atherosclerosis.  The patient was given 650 mg p.o. Tylenol, 10 mg of IV Decadron, 4 mg of IV Zofran, 1 L bolus of IV normal saline and IV remdesivir.  She will be admitted to a medical monitored bed for further evaluation and management.  PAST MEDICAL HISTORY:   Past Medical History:  Diagnosis Date  . Allergy   . Arthritis   . Breast cancer ([place])   . Breast cancer, left breast ([place]) 10/04/2014  . Diabetes mellitus without complication ([place])   . GERD (gastroesophageal reflux disease)   . Hyperlipidemia   . Hypertension   . Osteoporosis   . Sleep apnea     PAST SURGICAL HISTORY:   Past Surgical History:  Procedure Laterality Date  . ABDOMINAL HYSTERECTOMY     due to fibroid tumors 1990 Dr. Quenten Raven  . BREAST BIOPSY Left    negative  . BREAST BIOPSY Left    04/2013 positive  . BREAST CYST ASPIRATION Left   . BREAST EXCISIONAL BIOPSY Left    2015  . BREAST LUMPECTOMY Left 06/17/2013  . BREAST REDUCTION SURGERY Bilateral   . CESAREAN SECTION     x's 2  . CHOLECYSTECTOMY  09/2001  . COLONOSCOPY WITH PROPOFOL N/A 09/11/2017   Procedure: COLONOSCOPY WITH PROPOFOL;  Surgeon: Virgel Manifold, MD;  Location: [facility] ENDOSCOPY;  Service: Endoscopy;  Laterality: N/A;  . ESOPHAGOGASTRODUODENOSCOPY (EGD) WITH PROPOFOL N/A  09/11/2017   Procedure: ESOPHAGOGASTRODUODENOSCOPY (EGD) WITH PROPOFOL;  Surgeon: Virgel Manifold, MD;  Location: [facility] ENDOSCOPY;  Service: Endoscopy;  Laterality: N/A;  . REDUCTION MAMMAPLASTY    . s/p radiation therapy Left    2015    SOCIAL HISTORY:   Social History   Tobacco Use  . Smoking status: Former Smoker    Packs/day: 0.25    Years: 21.00    Pack years: 5.25    Quit date: 06/08/1997    Years since quitting: 23.0  . Smokeless tobacco: Never Used  Substance Use Topics  . Alcohol use: No    FAMILY HISTORY:   Family History  Problem Relation Age of Onset  . Hypertension Mother   .  Cancer Mother        breast and rectal cancer  . Breast cancer Mother 67  . Asthma Brother   . Hypertension Brother   . Allergic rhinitis Brother   . Breast cancer Maternal Grandmother     DRUG ALLERGIES:   Allergies  Allergen Reactions  . Allegra [Fexofenadine] Nausea And Vomiting  . Avelox [Moxifloxacin] Other (See Comments)    Hallucinations  . Bactrim [Sulfamethoxazole-Trimethoprim] Other (See Comments)    unknown  . Etodolac Nausea Only  . Fish-Derived Products Nausea And Vomiting    With "seafood"  . Penicillins Diarrhea and Nausea And Vomiting  . Shellfish Allergy Nausea Only  . Sulfa Antibiotics Other (See Comments)    unknown  . Macrobid [employer] Macro] Other (See Comments)    Constipation and globus hystericus.    REVIEW OF SYSTEMS:   ROS As per history of present illness. All pertinent systems were reviewed above. Constitutional, HEENT, cardiovascular, respiratory, GI, GU, musculoskeletal, neuro, psychiatric, endocrine, integumentary and hematologic systems were reviewed and are otherwise negative/unremarkable except for positive findings mentioned above in the HPI.   MEDICATIONS AT HOME:   Prior to Admission medications   Medication Sig Start Date End Date Taking? Authorizing Provider  albuterol (PROVENTIL HFA) 108 (90 Base) MCG/ACT inhaler Inhale 2 puffs into the lungs every 6 (six) hours as needed. 02/17/20   Mar Daring, PA-C  Blood Glucose Monitoring Suppl [facility] VERIO) w/Device KIT To check blood sugar once daily 01/05/19   Fenton Malling M, PA-C  cetirizine (ZYRTEC) 10 MG tablet Take 1 tablet (10 mg total) by mouth daily. 07/24/15   Margarita Rana, MD  glipiZIDE (GLUCOTROL) 10 MG tablet TAKE 1 TABLET BY MOUTH  TWICE DAILY BEFORE A MEAL 06/01/20   Fenton Malling M, PA-C  glucose blood [facility] VERIO) test strip To check blood sugar once daily 01/13/20   Fenton Malling M, PA-C  ketoconazole (NIZORAL) 2 % cream APPLY CREAM  TOPICALLY TWICE DAILY TO FEET 02/23/18   [provider]  Lancets Glory [place] ULTRASOFT) lancets To check BS once daily 01/16/20   Mar Daring, PA-C  lisinopril-hydrochlorothiazide (ZESTORETIC) 20-12.5 MG tablet TAKE 2 TABLETS BY MOUTH  DAILY 02/16/20   Mar Daring, PA-C  montelukast (SINGULAIR) 10 MG tablet TAKE 1 TABLET BY MOUTH  DAILY 11/15/19   Mar Daring, PA-C  pantoprazole (PROTONIX) 40 MG tablet Take 1 tablet (40 mg total) by mouth daily. 05/07/20   Mar Daring, PA-C  pioglitazone (ACTOS) 45 MG tablet Take 1 tablet (45 mg total) by mouth daily. 05/07/20   Mar Daring, PA-C  RYBELSUS 3 MG TABS Take 1 tablet by mouth every morning. 02/22/20   Mar Daring, PA-C  sitaGLIPtin (JANUVIA) 100 MG tablet Take  1 tablet (100 mg total) by mouth daily. 08/25/18   Mar Daring, PA-C  spironolactone (ALDACTONE) 25 MG tablet Take 1 tablet (25 mg total) by mouth daily. 04/25/20   Mar Daring, PA-C  SUMAtriptan (IMITREX) 50 MG tablet Take 1 tablet (50 mg total) by mouth every 2 (two) hours as needed for migraine. No more than 22m in 24 hours 07/07/19   BMar Daring PA-C  terconazole (TERAZOL 7) 0.4 % vaginal cream Apply topically to external vaginal tissue at bedtime for itching 10/17/19   FImagene Riches CNM  verapamil (CALAN) 80 MG tablet TAKE 1 TABLET BY MOUTH  TWICE DAILY 08/01/19   BMar Daring PA-C      VITAL SIGNS:  Blood pressure 113/67, pulse 67, temperature 97.8 F (36.6 C), temperature source Oral, resp. rate (!) 25, height 5' 4"  (1.626 m), weight 101.2 kg, SpO2 99 %.  PHYSICAL EXAMINATION:  Physical Exam  GENERAL:  61y.o.-year-old African-American female patient lying in the bed with mild respiratory distress with conversational dyspnea. EYES: Pupils equal, round, reactive to light and accommodation. No scleral icterus. Extraocular muscles intact.  HEENT: Head atraumatic, normocephalic. Oropharynx and  nasopharynx clear.  NECK:  Supple, no jugular venous distention. No thyroid enlargement, no tenderness.  LUNGS: Diminished bibasal breath sounds with mild bibasal crackles. CARDIOVASCULAR: Regular rate and rhythm, S1, S2 normal. No murmurs, rubs, or gallops.  ABDOMEN: Soft, nondistended, nontender. Bowel sounds present. No organomegaly or mass.  EXTREMITIES: No pedal edema, cyanosis, or clubbing.  NEUROLOGIC: Cranial nerves II through XII are intact. Muscle strength 5/5 in all extremities. Sensation intact. Gait not checked.  PSYCHIATRIC: The patient is alert and oriented x 3.  Normal affect and good eye contact. SKIN: No obvious rash, lesion, or ulcer.   LABORATORY PANEL:   CBC Recent Labs  Lab 06/10/20 1751  WBC 10.7*  HGB 11.2*  HCT 33.9*  PLT 308   ------------------------------------------------------------------------------------------------------------------  Chemistries  Recent Labs  Lab 06/10/20 1751  NA 133*  K 3.3*  CL 96*  CO2 27  GLUCOSE 58*  BUN 18  CREATININE 0.94  CALCIUM 9.4  AST 19  ALT 27  ALKPHOS 68  BILITOT 0.8   ------------------------------------------------------------------------------------------------------------------  Cardiac Enzymes No results for input(s): TROPONINI in the last 168 hours. ------------------------------------------------------------------------------------------------------------------  RADIOLOGY:  DG Chest 2 View  Result Date: 06/10/2020 CLINICAL DATA:  Fever EXAM: CHEST - 2 VIEW COMPARISON:  08/02/2016 FINDINGS: Suboptimal image quality. Possible right perihilar infiltrate on the frontal view not confirmed on the lateral view. No heart failure or effusion. Left lung clear. IMPRESSION: Suboptimal quality study. Possible right perihilar infiltrate. Recommend follow-up chest x-ray to confirm. Electronically Signed   By: CFranchot GalloM.D.   On: 06/10/2020 18:32   CT Angio Chest PE W/Cm &/Or Wo Cm  Result Date:  06/10/2020 CLINICAL DATA:  Body aches fever cough EXAM: CT ANGIOGRAPHY CHEST WITH CONTRAST TECHNIQUE: Multidetector CT imaging of the chest was performed using the standard protocol during bolus administration of intravenous contrast. Multiplanar CT image reconstructions and MIPs were obtained to evaluate the vascular anatomy. CONTRAST:  1026mOMNIPAQUE IOHEXOL 350 MG/ML SOLN COMPARISON:  Chest x-ray 06/10/2020, CT 02/21/2018 FINDINGS: Cardiovascular: Satisfactory opacification of the pulmonary arteries to the segmental level. No evidence of pulmonary embolism. Nonaneurysmal aorta. Mild aortic atherosclerosis. Borderline to mild cardiomegaly. No pericardial effusion Mediastinum/Nodes: Midline trachea. No thyroid mass. Subcentimeter mediastinal lymph nodes. Esophagus within normal limits. Stable cardio phrenic lymph nodes. Lungs/Pleura: No acute consolidation or  pleural effusion. No pneumothorax. Right middle lobe lung nodule adjacent to the fissure, series 4, image number 40. Subpleural left lower lobe pulmonary nodule measuring 7 mm on average, series 4, image number 44, may be slightly increased in size but was incompletely visualized. Upper Abdomen: Hepatic steatosis.  No acute abnormality. Musculoskeletal: Mild mild diffuse increased bone density without acute osseous abnormality. Review of the MIP images confirms the above findings. IMPRESSION: 1. Negative for acute pulmonary embolus or aortic dissection. Clear lung fields. 2. Pulmonary nodules measuring up to 7 mm in the left lower lobe. Non-contrast chest CT at 3-6 months is recommended. If the nodules are stable at time of repeat CT, then future CT at 18-24 months (from today's scan) is considered optional for low-risk patients, but is recommended for high-risk patients. This recommendation follows the consensus statement: Guidelines for Management of Incidental Pulmonary Nodules Detected on CT Images: From the Fleischner Society 2017; Radiology 2017;  [id number]. 3. Hepatic steatosis Aortic Atherosclerosis (ICD10-I70.0). Electronically Signed   By: Donavan Foil M.D.   On: 06/10/2020 22:08   CT Abdomen Pelvis W Contrast  Result Date: 06/10/2020 CLINICAL DATA:  Abdomen pain and fever EXAM: CT ABDOMEN AND PELVIS WITH CONTRAST TECHNIQUE: Multidetector CT imaging of the abdomen and pelvis was performed using the standard protocol following bolus administration of intravenous contrast. CONTRAST:  136m OMNIPAQUE IOHEXOL 350 MG/ML SOLN COMPARISON:  CT 02/21/2018 FINDINGS: Lower chest: Lung bases demonstrate dependent atelectasis or scarring. No acute consolidation or pleural effusion. Mild cardiomegaly. Hepatobiliary: Hepatic steatosis. Status post cholecystectomy. No biliary dilatation. Pancreas: Unremarkable. No pancreatic ductal dilatation or surrounding inflammatory changes. Spleen: Normal in size without focal abnormality. Adrenals/Urinary Tract: Adrenal glands are unremarkable. Kidneys are normal, without renal calculi, focal lesion, or hydronephrosis. Bladder is unremarkable. Stomach/Bowel: Stomach is within normal limits. Appendix appears normal. No evidence of bowel wall thickening, distention, or inflammatory changes. Vascular/Lymphatic: Mild aortic atherosclerosis. No aneurysm. No suspicious nodes Reproductive: Status post hysterectomy. No adnexal masses. Other: Negative for free air or free fluid. Musculoskeletal: No acute or significant osseous findings. IMPRESSION: 1. No CT evidence for acute intra-abdominal or pelvic abnormality. 2. Hepatic steatosis. Aortic Atherosclerosis (ICD10-I70.0). Electronically Signed   By: KDonavan FoilM.D.   On: 06/10/2020 21:59      IMPRESSION AND PLAN:   1.  Acute hypoxemic respiratory failure and sepsis secondary to COVID-19. -The patient will be admitted to a medically monitored isolation bed. -O2 protocol will be followed to keep O2 saturation above 93. -The patient will be admitted to an isolation  monitored bed with droplet and contact precautions. -The patient will be placed on scheduled Mucinex and as needed Tussionex. -We will avoid nebulization as much as we can, give bronchodilator MDI if needed, and with deterioration of oxygenation try to avoid BiPAP/CPAP if possible.    -Will obtain sputum Gram stain culture and sensitivity and follow blood cultures. -O2 protocol will be followed. -We will follow CRP, ferritin, LDH and D-dimer. -Will follow manual differential for ANC/ALC ratio as well as follow troponin I and daily CBC with manual differential and CMP. - Will place the patient on IV Remdesivir and IV steroid therapy with IV Solu-Medrol with elevated inflammatory markers. -The patient will be placed on vitamin D3, vitamin C, zinc sulfate, p.o. Pepcid and aspirin. -I discussed Baricitinib and the patient agreed to proceed with it.  2.  Urinary tract infection with associated lower abdominal pain.. -The patient will be placed on IV Rocephin and will follow urine culture  and sensitivity.  3.  Type II diabetes mellitus with hypoglycemia. -The patient will be placed on supplemental coverage with NovoLog and will place her on Tradjenta and Actos. -We will hold off her glipizide given her hypoglycemia.  4.  Mild hyponatremia and hypochloremia with hypokalemia. -The patient will be hydrated with IV normal saline and potassium will be replaced. -BMP will be followed.  5.  Essential hypertension. -We will continue her Zestril and K. Lan and hold off her HCTZ given hyponatremia and hypokalemia.  6.  GERD. -H2 blocker therapy will be continued.  7.  DVT prophylaxis. -Subcutaneous Lovenox   All the records are reviewed and case discussed with ED provider. The plan of care was discussed in details with the patient (and family). I answered all questions. The patient agreed to proceed with the above mentioned plan. Further management will depend upon hospital course.   CODE  STATUS: Full code  Status is: Inpatient  Remains inpatient appropriate because:Ongoing diagnostic testing needed not appropriate for outpatient work up, Unsafe d/c plan, IV treatments appropriate due to intensity of illness or inability to take PO and Inpatient level of care appropriate due to severity of illness   Dispo: The patient is from: Home              Anticipated d/c is to: Home              Anticipated d/c date is: 3 days              Patient currently is not medically stable to d/c.   TOTAL TIME TAKING CARE OF THIS PATIENT: 55 minutes.    Christel Mormon M.D on 06/11/2020 at 2:16 AM  [facility]   From 7 PM-7 AM, contact night-coverage [email or web address]  CC: Primary care physician; Mar Daring, PA-C

## 2020-06-11 NOTE — ED Notes (Signed)
Lunch meal tray given at this time.  

## 2020-06-11 NOTE — Progress Notes (Addendum)
PROGRESS NOTE    Alexis Lloyd  [id number] DOB: 1961/05/08 DOA: 06/10/2020 PCP: Mar Daring, PA-C    Brief Narrative:  Alexis Lloyd was admitted to the hospital with a working diagnosis of acute hypoxic respiratory failure due to SARS COVID-19 viral pneumonia.  60 year old female with past medical history for type 2 diabetes mellitus, dyslipidemia, hypertension and obstructive sleep apnea who presented with cough and wheezing.  Reported 48 hours of wheezing, dry cough, generalized weakness, chills, fevers, loss of taste and smell. Positive Covid 19 vaccination 2/21 and 4/21. On her initial physical examination her temperature was 93.3 F, blood pressure 114/37, heart rate 123, respirate 20, oxygen saturation 80% on room air.  Her lungs had decreased breath sounds bilaterally, heart S1-S2, present rhythmic, soft abdomen, no lower extremity edema.  Sodium 133, potassium 3.3, chloride 96, bicarb 27, glucose 58, BUN 18, creatinine 0.8, white count 10.7, hemoglobin 9.2, hematocrit 33.9, platelets 308.  SARS COVID-19 positive.  Urinalysis specific gravity 1.028.  Chest radiograph with faint right lower lobe peripheral interstitial infiltrate. CT chest negative for pulmonary embolism, faint groundglass bilateral opacity, 7 mm left lower lobe nodule.  EKG had 19 bpm, normal axis, normal intervals, sinus rhythm, no ST segment changes or T wave normalities.  Assessment & Plan:   Principal Problem:   Pneumonia due to COVID-19 virus Active Problems:   Diabetic neuropathy ([place])   Hypercholesteremia   Hypertension   OSA (obstructive sleep apnea)   DM type 2, uncontrolled, with neuropathy ([facility])   Class 2 obesity   T2DM (type 2 diabetes mellitus) ([place])   1.  Acute hypoxic respiratory failure due to SARS COVID-19 viral pneumonia.  RR: 21  Pulse oxymetry: 99%  Fi02: 2L /min [place]    COVID-19 Labs  No results for input(s): DDIMER, FERRITIN, LDH, CRP in the last 72 hours.  Lab  Results  Component Value Date   [place] (A) 06/10/2020   [place] Not Detected 07/08/2019    Patient with improvement in her dyspnea but not yet back to baseline.   Continue medical therapy with remdesivir and systemic steroids. On bronchodilator therapy, antitussive agents, and airway clearing techniques. Patient with faint right lower lobe infiltrate, no inflammatory markers available, but patient clinically feeling better. Will hold on baricitinib for now.   Continue to follow up on inflammatory markers and oxygenation, keep oxygen saturation 88% or greater.   2. T2DM uncontrolled with steroid induced hyperglycemia. Continue glucose cover and monitoring with insulin sliding scale. Patient is tolerating po well.    Continue with pioglitazone and linagliptin,  3. HTN Continue blood pressure monitoring. Blood pressure 96/65 this am, will hold on antihypertensive agents for now, at home on high dose lisinopril, plus spironolactone and verapamil.    4. GERD. Continue with antiacid therapy.   5. Obesity class 2. Calculated BMI 38,2 with high risk for inpatient complications.  6. Mild hyponatremia. Discontinue IV fluids, patient is tolerating po well.   Mild pyuria, not consistent with urine infection (uti ruled out) discontinue antibiotic therapy. Ruled out sepsis  Patient continue to be at high risk for worsening respiratory failure   Status is: Inpatient  Remains inpatient appropriate because:IV treatments appropriate due to intensity of illness or inability to take PO   Dispo: The patient is from: Home              Anticipated d/c is to: Home              Anticipated d/c date  is: 3 days              Patient currently is not medically stable to d/c.   DVT prophylaxis: Enoxaparin   Code Status:   full  Family Communication:  No family at the bedside      Subjective: Patient is feeling better, with improved dyspnea but not yet back to baseline, no nausea  or vomiting, and tolerating po well.   Objective: Vitals:   06/11/20 0600 06/11/20 0700 06/11/20 1200 06/11/20 1230  BP: 111/74 125/82 135/84 134/78  Pulse: 82 86 92 82  Resp: (!) 24 16 20  (!) 21  Temp:      TempSrc:      SpO2: 98% 97% 99% 99%  Weight:      Height:        Intake/Output Summary (Last 24 hours) at 06/11/2020 1501 Last data filed at 06/11/2020 08/09/2020 Gross per 24 hour  Intake 1350 ml  Output 1575 ml  Net -225 ml   Filed Weights   06/10/20 1747  Weight: 101.2 kg    Examination:   General: deconditioned  Neurology: Awake and alert, non focal  E ENT: no pallor, no icterus, oral mucosa moist Cardiovascular: No JVD. S1-S2 present, rhythmic, no gallops, rubs, or murmurs. No lower extremity edema. Pulmonary: positive breath sounds bilaterally, no wheezing Gastrointestinal. Abdomen soft and non tender Skin. No rashes Musculoskeletal: no joint deformities     Data Reviewed: I have personally reviewed following labs and imaging studies  CBC: Recent Labs  Lab 06/10/20 1751  WBC 10.7*  NEUTROABS 8.4*  HGB 11.2*  HCT 33.9*  MCV 88.1  PLT 308   Basic Metabolic Panel: Recent Labs  Lab 06/10/20 1751  NA 133*  K 3.3*  CL 96*  CO2 27  GLUCOSE 58*  BUN 18  CREATININE 0.94  CALCIUM 9.4   GFR: Estimated Creatinine Clearance: 72.7 mL/min (by C-G formula based on SCr of 0.94 mg/dL). Liver Function Tests: Recent Labs  Lab 06/10/20 1751  AST 19  ALT 27  ALKPHOS 68  BILITOT 0.8  PROT 7.8  ALBUMIN 3.7   No results for input(s): LIPASE, AMYLASE in the last 168 hours. No results for input(s): AMMONIA in the last 168 hours. Coagulation Profile: No results for input(s): INR, PROTIME in the last 168 hours. Cardiac Enzymes: No results for input(s): CKTOTAL, CKMB, CKMBINDEX, TROPONINI in the last 168 hours. BNP (last 3 results) No results for input(s): PROBNP in the last 8760 hours. HbA1C: Recent Labs    06/11/20 0307  HGBA1C 7.6*   CBG: Recent  Labs  Lab 06/11/20 1308  GLUCAP 286*   Lipid Profile: No results for input(s): CHOL, HDL, LDLCALC, TRIG, CHOLHDL, LDLDIRECT in the last 72 hours. Thyroid Function Tests: No results for input(s): TSH, T4TOTAL, FREET4, T3FREE, THYROIDAB in the last 72 hours. Anemia Panel: No results for input(s): VITAMINB12, FOLATE, FERRITIN, TIBC, IRON, RETICCTPCT in the last 72 hours.    Radiology Studies: I have reviewed all of the imaging during this hospital visit personally     Scheduled Meds: . vitamin C  500 mg Oral Daily  . aspirin EC  81 mg Oral Daily  . baricitinib  4 mg Oral Daily  . cholecalciferol  1,000 Units Oral Daily  . enoxaparin (LOVENOX) injection  0.5 mg/kg Subcutaneous Q24H  . famotidine  20 mg Oral BID  . guaiFENesin  600 mg Oral BID  . insulin aspart  0-20 Units Subcutaneous TID AC & HS  .  linagliptin  5 mg Oral Daily  . lisinopril  40 mg Oral Daily  . loratadine  10 mg Oral Daily  . methylPREDNISolone (SOLU-MEDROL) injection  1 mg/kg Intravenous Q12H   Followed by  . [START ON 06/14/2020] predniSONE  50 mg Oral Daily  . montelukast  10 mg Oral Daily  . pantoprazole  40 mg Oral Daily  . pioglitazone  45 mg Oral Daily  . spironolactone  25 mg Oral Daily  . verapamil  80 mg Oral BID  . zinc sulfate  220 mg Oral Daily   Continuous Infusions: . sodium chloride 100 mL/hr at 06/11/20 1129  . cefTRIAXone (ROCEPHIN)  IV Stopped (06/11/20 0606)  . [START ON 06/12/2020] remdesivir 100 mg in NS 100 mL       LOS: 1 day        Mona Ayars Gerome Apley, MD

## 2020-06-11 NOTE — ED Notes (Signed)
CBG 146, Glucometer failed to crossover into the chart.

## 2020-06-11 NOTE — ED Notes (Signed)
Pt ambulated to restroom at this time.

## 2020-06-11 NOTE — ED Notes (Signed)
Pt ambulatory to the restroom at this time.  

## 2020-06-11 NOTE — ED Notes (Signed)
Breakfast tray given at this time.  

## 2020-06-11 NOTE — ED Notes (Signed)
Pt bed pad soiled with urine. Bed pad changed at this time.

## 2020-06-12 ENCOUNTER — Telehealth: Payer: Medicare Other

## 2020-06-12 ENCOUNTER — Encounter: Payer: Self-pay | Admitting: Family Medicine

## 2020-06-12 DIAGNOSIS — U071 COVID-19: Secondary | ICD-10-CM | POA: Diagnosis not present

## 2020-06-12 DIAGNOSIS — E1143 Type 2 diabetes mellitus with diabetic autonomic (poly)neuropathy: Secondary | ICD-10-CM | POA: Diagnosis not present

## 2020-06-12 DIAGNOSIS — E669 Obesity, unspecified: Secondary | ICD-10-CM | POA: Diagnosis not present

## 2020-06-12 DIAGNOSIS — E7800 Pure hypercholesterolemia, unspecified: Secondary | ICD-10-CM | POA: Diagnosis not present

## 2020-06-12 LAB — FERRITIN: Ferritin: 251 ng/mL (ref 11–307)

## 2020-06-12 LAB — COMPREHENSIVE METABOLIC PANEL
ALT: 24 U/L (ref 0–44)
AST: 14 U/L — ABNORMAL LOW (ref 15–41)
Albumin: 3.1 g/dL — ABNORMAL LOW (ref 3.5–5.0)
Alkaline Phosphatase: 54 U/L (ref 38–126)
Anion gap: 7 (ref 5–15)
BUN: 26 mg/dL — ABNORMAL HIGH (ref 8–23)
CO2: 27 mmol/L (ref 22–32)
Calcium: 8.8 mg/dL — ABNORMAL LOW (ref 8.9–10.3)
Chloride: 102 mmol/L (ref 98–111)
Creatinine, Ser: 0.7 mg/dL (ref 0.44–1.00)
GFR, Estimated: >60 mL/min (ref >60)
Glucose, Bld: 145 mg/dL — ABNORMAL HIGH (ref 70–99)
Potassium: 4.4 mmol/L (ref 3.5–5.1)
Sodium: 136 mmol/L (ref 135–145)
Total Bilirubin: 0.6 mg/dL (ref 0.3–1.2)
Total Protein: 7.2 g/dL (ref 6.5–8.1)

## 2020-06-12 LAB — D-DIMER, QUANTITATIVE: D-Dimer, Quant: 1.85 ug/mL-FEU — ABNORMAL HIGH (ref 0.00–0.50)

## 2020-06-12 LAB — C-REACTIVE PROTEIN: CRP: 1.1 mg/dL — ABNORMAL HIGH (ref <1.0)

## 2020-06-12 LAB — GLUCOSE, CAPILLARY
Glucose-Capillary: 158 mg/dL — ABNORMAL HIGH (ref 70–99)
Glucose-Capillary: 183 mg/dL — ABNORMAL HIGH (ref 70–99)
Glucose-Capillary: 124 mg/dL — ABNORMAL HIGH (ref 70–99)

## 2020-06-12 LAB — CBG MONITORING, ED: Glucose-Capillary: 134 mg/dL — ABNORMAL HIGH (ref 70–99)

## 2020-06-12 NOTE — Progress Notes (Signed)
PROGRESS NOTE    Alexis Lloyd  EUM:[id number] DOB: 1960/07/25 DOA: 06/10/2020 PCP: Margaretann Loveless, PA-C    Brief Narrative:  Alexis Lloyd was admitted to the hospital with a working diagnosis of acute hypoxic respiratory failure due to SARS COVID-19 viral pneumonia.  60 year old female with past medical history for type 2 diabetes mellitus, dyslipidemia, hypertension and obstructive sleep apnea who presented with cough and wheezing.  Reported 48 hours of wheezing, dry cough, generalized weakness, chills, fevers, loss of taste and smell. Positive Covid 19 vaccination 2/21 and 4/21. On her initial physical examination her temperature was 93.3 F, blood pressure 114/37, heart rate 123, respirate 20, oxygen saturation 80% on room air.  Her lungs had decreased breath sounds bilaterally, heart S1-S2, present rhythmic, soft abdomen, no lower extremity edema.  Sodium 133, potassium 3.3, chloride 96, bicarb 27, glucose 58, BUN 18, creatinine 0.8, white count 10.7, hemoglobin 9.2, hematocrit 33.9, platelets 308.  SARS COVID-19 positive.  Urinalysis specific gravity 1.028.  Chest radiograph with faint right lower lobe peripheral interstitial infiltrate. CT chest negative for pulmonary embolism, faint groundglass bilateral opacity, 7 mm left lower lobe nodule.  EKG had 19 bpm, normal axis, normal intervals, sinus rhythm, no ST segment changes or T wave normalities.  She was placed on medical therapy with remdesivir and systemic corticosteroids.   Patient with faint right lower lobe infiltrate, no significant elevation of inflammatory markers, holding on baricitinib.   Assessment & Plan:   Principal Problem:   Pneumonia due to COVID-19 virus Active Problems:   Diabetic neuropathy (HCC)   Hypercholesteremia   Hypertension   OSA (obstructive sleep apnea)   DM type 2, uncontrolled, with neuropathy (HCC)   Class 2 obesity   T2DM (type 2 diabetes mellitus) (HCC)   1.  Acute hypoxic  respiratory failure due to SARS COVID-19 viral pneumonia.  RR: 12  Pulse oxymetry: 100%  Fi02: 21% room air  COVID-19 Labs  Recent Labs    06/12/20 0700  DDIMER 1.85*  FERRITIN 251  CRP 1.1*    Lab Results  Component Value Date   SARSCOV2NAA POSITIVE (A) 06/10/2020   SARSCOV2NAA Not Detected 07/08/2019   Inflammatory markers with mild elevation, low oxygen requirements.   Medical therapy with remdesivir and systemic steroids. Continue with bronchodilator therapy, antitussive agents, and airway clearing techniques.  Encourage out of bed to chair tid with meals, PT and OT. If continue to improve, to consider dc home before 5th dose of remdesivir.   2. T2DM uncontrolled with steroid induced hyperglycemia. Fating glucose this am 145, continue glucose cover and monitoring with insulin sliding scale. Tolerating po well.    On pioglitazone and linagliptin,  3. HTN at home on lisinopril, spironolactone and verapamil. Continue to hold on antihypertensive medications.   4. GERD. Continue with famotidine and pantoprazole.   5. Obesity class 2-3. Calculated BMI 38,2 High risk for inpatient complications.  6. Mild hyponatremia. Improved Na at 136, renal function continue to be stable with serum cr at 0,70 and K at 4,4 with bicarbonate at 27.   Mild pyuria, not consistent with urine infection (uti ruled out) discontinue antibiotic therapy. Ruled out sepsis   Status is: Inpatient  Remains inpatient appropriate because:IV treatments appropriate due to intensity of illness or inability to take PO   Dispo: The patient is from: Home              Anticipated d/c is to: Home  Anticipated d/c date is: 1 day              Patient currently is not medically stable to d/c. possible discharge home in the next 24 Hr.    DVT prophylaxis: Enoxaparin   Code Status:   full  Family Communication:  Patient updating family by phone      Subjective: Patient with improvement  dyspnea but not yet back to baseline, positive cough no chest pain, no nausea or vomiting,.   Objective: Vitals:   06/11/20 2315 06/11/20 2330 06/12/20 0330 06/12/20 0338  BP:  (!) 91/46  (!) 147/80  Pulse: 72 72 (!) 56 83  Resp: 19 20 (!) 25 12  Temp:      TempSrc:      SpO2: 96% 93% 99% 100%  Weight:      Height:        Intake/Output Summary (Last 24 hours) at 06/12/2020 0706 Last data filed at 06/11/2020 2337 Gross per 24 hour  Intake 854.59 ml  Output 450 ml  Net 404.59 ml   Filed Weights   06/10/20 1747  Weight: 101.2 kg    Examination:   General: Not in pain or dyspnea, deconditioned  Neurology: Awake and alert, non focal  E ENT: mild pallor, no icterus, oral mucosa moist Cardiovascular: No JVD. S1-S2 present, rhythmic, no gallops, rubs, or murmurs. No lower extremity edema. Pulmonary: positive breath sounds bilaterally, with no wheezing, rhonchi or rales. Gastrointestinal. Abdomen soft and non tender Skin. No rashes Musculoskeletal: no joint deformities     Data Reviewed: I have personally reviewed following labs and imaging studies  CBC: Recent Labs  Lab 06/10/20 1751  WBC 10.7*  NEUTROABS 8.4*  HGB 11.2*  HCT 33.9*  MCV 88.1  PLT [id number]   Basic Metabolic Panel: Recent Labs  Lab 06/10/20 1751  NA 133*  K 3.3*  CL 96*  CO2 27  GLUCOSE 58*  BUN 18  CREATININE 0.94  CALCIUM 9.4   GFR: Estimated Creatinine Clearance: 72.7 mL/min (by C-G formula based on SCr of 0.94 mg/dL). Liver Function Tests: Recent Labs  Lab 06/10/20 1751  AST 19  ALT 27  ALKPHOS 68  BILITOT 0.8  PROT 7.8  ALBUMIN 3.7   No results for input(s): LIPASE, AMYLASE in the last 168 hours. No results for input(s): AMMONIA in the last 168 hours. Coagulation Profile: No results for input(s): INR, PROTIME in the last 168 hours. Cardiac Enzymes: No results for input(s): CKTOTAL, CKMB, CKMBINDEX, TROPONINI in the last 168 hours. BNP (last 3 results) No results for input(s):  PROBNP in the last 8760 hours. HbA1C: Recent Labs    06/11/20 0307  HGBA1C 7.6*   CBG: Recent Labs  Lab 06/11/20 0740 06/11/20 1308 06/11/20 1750 06/11/20 2100  GLUCAP 149* 286* 199* 205*   Lipid Profile: No results for input(s): CHOL, HDL, LDLCALC, TRIG, CHOLHDL, LDLDIRECT in the last 72 hours. Thyroid Function Tests: No results for input(s): TSH, T4TOTAL, FREET4, T3FREE, THYROIDAB in the last 72 hours. Anemia Panel: No results for input(s): VITAMINB12, FOLATE, FERRITIN, TIBC, IRON, RETICCTPCT in the last 72 hours.    Radiology Studies: I have reviewed all of the imaging during this hospital visit personally     Scheduled Meds:  vitamin C  500 mg Oral Daily   aspirin EC  81 mg Oral Daily   chlorpheniramine-HYDROcodone  5 mL Oral Q12H   cholecalciferol  1,000 Units Oral Daily   enoxaparin (LOVENOX) injection  0.5 mg/kg Subcutaneous Q24H  famotidine  20 mg Oral BID   guaiFENesin  600 mg Oral BID   insulin aspart  0-20 Units Subcutaneous TID AC & HS   linagliptin  5 mg Oral Daily   loratadine  10 mg Oral Daily   montelukast  10 mg Oral Daily   pantoprazole  40 mg Oral Daily   pioglitazone  45 mg Oral Daily   zinc sulfate  220 mg Oral Daily   Continuous Infusions:  remdesivir 100 mg in NS 100 mL       LOS: 2 days        Teniqua Marron Gerome Apley, MD

## 2020-06-13 ENCOUNTER — Ambulatory Visit: Payer: Medicare Other

## 2020-06-13 DIAGNOSIS — J1282 Pneumonia due to coronavirus disease 2019: Secondary | ICD-10-CM | POA: Diagnosis not present

## 2020-06-13 DIAGNOSIS — U071 COVID-19: Principal | ICD-10-CM

## 2020-06-13 DIAGNOSIS — E1143 Type 2 diabetes mellitus with diabetic autonomic (poly)neuropathy: Secondary | ICD-10-CM

## 2020-06-13 DIAGNOSIS — E669 Obesity, unspecified: Secondary | ICD-10-CM

## 2020-06-13 LAB — COMPREHENSIVE METABOLIC PANEL
ALT: 23 U/L (ref 0–44)
AST: 12 U/L — ABNORMAL LOW (ref 15–41)
Albumin: 2.9 g/dL — ABNORMAL LOW (ref 3.5–5.0)
Alkaline Phosphatase: 47 U/L (ref 38–126)
Anion gap: 6 (ref 5–15)
BUN: 27 mg/dL — ABNORMAL HIGH (ref 8–23)
CO2: 25 mmol/L (ref 22–32)
Calcium: 8.4 mg/dL — ABNORMAL LOW (ref 8.9–10.3)
Chloride: 105 mmol/L (ref 98–111)
Creatinine, Ser: 0.75 mg/dL (ref 0.44–1.00)
GFR, Estimated: >60 mL/min (ref >60)
Glucose, Bld: 123 mg/dL — ABNORMAL HIGH (ref 70–99)
Potassium: 4.2 mmol/L (ref 3.5–5.1)
Sodium: 136 mmol/L (ref 135–145)
Total Bilirubin: 0.5 mg/dL (ref 0.3–1.2)
Total Protein: 6.5 g/dL (ref 6.5–8.1)

## 2020-06-13 LAB — C-REACTIVE PROTEIN: CRP: 1.3 mg/dL — ABNORMAL HIGH (ref <1.0)

## 2020-06-13 LAB — FERRITIN: Ferritin: 249 ng/mL (ref 11–307)

## 2020-06-13 LAB — GLUCOSE, CAPILLARY
Glucose-Capillary: 90 mg/dL (ref 70–99)
Glucose-Capillary: 161 mg/dL — ABNORMAL HIGH (ref 70–99)
Glucose-Capillary: 211 mg/dL — ABNORMAL HIGH (ref 70–99)
Glucose-Capillary: 187 mg/dL — ABNORMAL HIGH (ref 70–99)

## 2020-06-13 LAB — URINE CULTURE: Culture: >=100000 — AB

## 2020-06-13 LAB — D-DIMER, QUANTITATIVE: D-Dimer, Quant: 0.73 ug/mL-FEU — ABNORMAL HIGH (ref 0.00–0.50)

## 2020-06-13 MED ORDER — DEXAMETHASONE 4 MG PO TABS
6.0000 mg | ORAL_TABLET | Freq: Every day | ORAL | Status: DC
  Administered 2020-06-13 – 2020-06-15 (×3): 6 mg via ORAL
  Filled 2020-06-13 (×3): qty 2

## 2020-06-13 NOTE — Plan of Care (Signed)
  Problem: Education: Goal: Knowledge of risk factors and measures for prevention of condition will improve Outcome: Progressing   Problem: Coping: Goal: Psychosocial and spiritual needs will be supported Outcome: Progressing   Problem: Respiratory: Goal: Will maintain a patent airway Outcome: Progressing Goal: Complications related to the disease process, condition or treatment will be avoided or minimized Outcome: Progressing   Problem: Respiratory: Goal: Complications related to the disease process, condition or treatment will be avoided or minimized Outcome: Progressing   Problem: Education: Goal: Knowledge of General Education information will improve Description: Including pain rating scale, medication(s)/side effects and non-pharmacologic comfort measures Outcome: Progressing   Problem: Health Behavior/Discharge Planning: Goal: Ability to manage health-related needs will improve Outcome: Progressing   Problem: Clinical Measurements: Goal: Ability to maintain clinical measurements within normal limits will improve Outcome: Progressing Goal: Will remain free from infection Outcome: Progressing Goal: Diagnostic test results will improve Outcome: Progressing Goal: Respiratory complications will improve Outcome: Progressing Goal: Cardiovascular complication will be avoided Outcome: Progressing   Problem: Activity: Goal: Risk for activity intolerance will decrease Outcome: Progressing   Problem: Nutrition: Goal: Adequate nutrition will be maintained Outcome: Progressing

## 2020-06-13 NOTE — Progress Notes (Signed)
PROGRESS NOTE  Alexis Lloyd [id number] DOB: 1961-03-21 DOA: 06/10/2020 PCP: Mar Daring, PA-C   LOS: 3 days   Brief Narrative / Interim history: 60 year old female with past medical history for type 2 diabetes mellitus, dyslipidemia, hypertension and obstructive sleep apnea who presented with cough and wheezing. Reported 48 hours of wheezing, dry cough, generalized weakness, chills, fevers, loss of taste and smell.  She was found to be hypoxic, admitted to the hospital and started on remdesivir along with steroids  Subjective / 24h Interval events: Still feeling short of breath and lightheaded with walking  Assessment & Plan:  Principal Problem Acute Hypoxic Respiratory Failure due to Covid-19 Viral Illness -Continue to wean off as tolerated, minimal requirements currently.  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 vitamin C  500 mg Oral Daily  . aspirin EC  81 mg Oral Daily  . chlorpheniramine-HYDROcodone  5 mL Oral Q12H  . cholecalciferol  1,000 Units Oral Daily  . dexamethasone  6 mg Oral Daily  . enoxaparin (LOVENOX) injection  0.5 mg/kg Subcutaneous Q24H  . guaiFENesin  600 mg Oral BID  . insulin aspart  0-20 Units Subcutaneous TID AC & HS  . linagliptin  5 mg  Oral Daily  . loratadine  10 mg Oral Daily  . montelukast  10 mg Oral Daily  . pantoprazole  40 mg Oral Daily  . pioglitazone  45 mg Oral Daily  . zinc sulfate  220 mg Oral Daily   Continuous Infusions: . remdesivir 100 mg in NS 100 mL 100 mg (06/13/20 0959)   PRN Meds:.acetaminophen, albuterol, guaiFENesin-dextromethorphan, magnesium hydroxide, ondansetron **OR** ondansetron (ZOFRAN) IV, SUMAtriptan, traZODone  DVT prophylaxis: Lovenox Code Status: Full code Family Communication: d/w patient    Status is: Inpatient  Remains inpatient appropriate because:Inpatient level of care appropriate due to severity of illness   Dispo: The patient is from: Home              Anticipated d/c is to: Home              Anticipated d/c date is: 2 days              Patient currently is not medically stable to d/c.  Consultants:  None   Procedures:  None   Microbiology: None   Antibacterials: None    Objective: Vitals:   06/13/20 0024 06/13/20 0417 06/13/20 0739 06/13/20 1207  BP: 108/64 114/66 (!) 121/58 (!) 109/57  Pulse: (!) 108 90 89 72  Resp: 20 16 18 20   Temp: 98.9 F (37.2 C) 98.6 F (37 C) 99.2 F (37.3 C) 98.6 F (37 C)  TempSrc:   Oral   SpO2: 98% 97% 96% 98%  Weight:      Height:  Intake/Output Summary (Last 24 hours) at 06/13/2020 1250 Last data filed at 06/13/2020 0053 Gross per 24 hour  Intake 200 ml  Output --  Net 200 ml   Filed Weights   06/10/20 1747  Weight: 101.2 kg    Examination:  Constitutional: NAD Eyes: no scleral icterus ENMT: Mucous membranes are moist.  Neck: normal, supple Respiratory: Faint bibasilar rhonchi bilaterally, no wheezing, no crackles.  Increased respiratory effort at times Cardiovascular: Regular rate and rhythm, no murmurs / rubs / gallops. No LE edema.  Abdomen: non distended, no tenderness. Bowel sounds positive.  Musculoskeletal: no clubbing / cyanosis.  Skin: no rashes Neurologic: CN 2-12 grossly intact.  Strength 5/5 in all 4.  Psychiatric: Normal judgment and insight. Alert and oriented x 3. Normal mood.    Data Reviewed: I have independently reviewed following labs and imaging studies   CBC: Recent Labs  Lab 06/10/20 1751  WBC 10.7*  NEUTROABS 8.4*  HGB 11.2*  HCT 33.9*  MCV 88.1  PLT [id number]   Basic Metabolic Panel: Recent Labs  Lab 06/10/20 1751 06/12/20 0700 06/13/20 0402  NA 133* 136 136  K 3.3* 4.4 4.2  CL 96* 102 105  CO2 27 27 25   GLUCOSE 58* 145* 123*  BUN 18 26* 27*  CREATININE 0.94 0.70 0.75  CALCIUM 9.4 8.8* 8.4*   GFR: Estimated Creatinine Clearance: 85.5 mL/min (by C-G formula based on SCr of 0.75 mg/dL). Liver Function Tests: Recent Labs  Lab 06/10/20 1751 06/12/20 0700 06/13/20 0402  AST 19 14* 12*  ALT 27 24 23   ALKPHOS 68 54 47  BILITOT 0.8 0.6 0.5  PROT 7.8 7.2 6.5  ALBUMIN 3.7 3.1* 2.9*   No results for input(s): LIPASE, AMYLASE in the last 168 hours. No results for input(s): AMMONIA in the last 168 hours. Coagulation Profile: No results for input(s): INR, PROTIME in the last 168 hours. Cardiac Enzymes: No results for input(s): CKTOTAL, CKMB, CKMBINDEX, TROPONINI in the last 168 hours. BNP (last 3 results) No results for input(s): PROBNP in the last 8760 hours. HbA1C: Recent Labs    06/11/20 0307  HGBA1C 7.6*   CBG: Recent Labs  Lab 06/12/20 1231 06/12/20 1551 06/12/20 1947 06/13/20 0735 06/13/20 1122  GLUCAP 158* 183* 124* 90 161*   Lipid Profile: No results for input(s): CHOL, HDL, LDLCALC, TRIG, CHOLHDL, LDLDIRECT in the last 72 hours. Thyroid Function Tests: No results for input(s): TSH, T4TOTAL, FREET4, T3FREE, THYROIDAB in the last 72 hours. Anemia Panel: Recent Labs    06/12/20 0700 06/13/20 0402  FERRITIN 251 249   Urine analysis:    Component Value Date/Time   COLORURINE YELLOW (A) 06/11/2020 0054   APPEARANCEUR CLEAR (A) 06/11/2020 0054   APPEARANCEUR Cloudy (A) 07/16/2015 0000   LABSPEC 1.028  06/11/2020 0054   LABSPEC 1.008 03/30/2013 2057   PHURINE 5.0 06/11/2020 0054   GLUCOSEU NEGATIVE 06/11/2020 0054   GLUCOSEU >=500 03/30/2013 2057   HGBUR SMALL (A) 06/11/2020 0054   BILIRUBINUR NEGATIVE 06/11/2020 0054   BILIRUBINUR Negative 02/09/2020 1157   BILIRUBINUR Negative 07/16/2015 0000   BILIRUBINUR Negative 03/30/2013 2057   KETONESUR NEGATIVE 06/11/2020 0054   PROTEINUR NEGATIVE 06/11/2020 0054   UROBILINOGEN 0.2 02/09/2020 1157   NITRITE POSITIVE (A) 06/11/2020 0054   LEUKOCYTESUR NEGATIVE 06/11/2020 0054   LEUKOCYTESUR Negative 03/30/2013 2057   Sepsis Labs: Invalid input(s): PROCALCITONIN, LACTICIDVEN  Recent Results (from the past 240 hour(s))  Resp Panel by RT-PCR (Flu A&B, Covid) Nasopharyngeal Swab  Status: Abnormal   Collection Time: 06/10/20  8:17 PM   Specimen: Nasopharyngeal Swab; Nasopharyngeal(NP) swabs in vial transport medium  Result Value Ref Range Status   SARS Coronavirus 2 by RT PCR POSITIVE (A) NEGATIVE Final    Comment: RESULT CALLED TO, READ BACK BY AND VERIFIED WITH: ASHTON PETERS 06/10/20 AT 2113 BY ACR (NOTE) SARS-CoV-2 target nucleic acids are DETECTED.  The SARS-CoV-2 RNA is generally detectable in upper respiratory specimens during the acute phase of infection. Positive results are indicative of the presence of the identified virus, but do not rule out bacterial infection or co-infection with other pathogens not detected by the test. Clinical correlation with patient history and other diagnostic information is necessary to determine patient infection status. The expected result is Negative.  Fact Sheet for Patients: BloggerCourse.com  Fact Sheet for Healthcare Providers: SeriousBroker.it  This test is not yet approved or cleared by the [place] FDA and  has been authorized for detection and/or diagnosis of SARS-CoV-2 by FDA under an Emergency Use Authorization (EUA).  This  EUA will remain in effect (meaning this test can  be used) for the duration of  the COVID-19 declaration under Section 564(b)(1) of the Act, 21 U.S.C. section 360bbb-3(b)(1), unless the authorization is terminated or revoked sooner.     Influenza A by PCR NEGATIVE NEGATIVE Final   Influenza B by PCR NEGATIVE NEGATIVE Final    Comment: (NOTE) The Xpert Xpress SARS-CoV-2/FLU/RSV plus assay is intended as an aid in the diagnosis of influenza from Nasopharyngeal swab specimens and should not be used as a sole basis for treatment. Nasal washings and aspirates are unacceptable for Xpert Xpress SARS-CoV-2/FLU/RSV testing.  Fact Sheet for Patients: BloggerCourse.com  Fact Sheet for Healthcare Providers: SeriousBroker.it  This test is not yet approved or cleared by the [place] FDA and has been authorized for detection and/or diagnosis of SARS-CoV-2 by FDA under an Emergency Use Authorization (EUA). This EUA will remain in effect (meaning this test can be used) for the duration of the COVID-19 declaration under Section 564(b)(1) of the Act, 21 U.S.C. section 360bbb-3(b)(1), unless the authorization is terminated or revoked.  Performed at [facility], [address] [place], [place] [address]   Urine Culture     Status: Abnormal   Collection Time: 06/11/20 12:54 AM   Specimen: Urine, Random  Result Value Ref Range Status   Specimen Description   Final    URINE, RANDOM Performed at [facility], [address] [place], [place] [address]    Special Requests   Final    NONE Performed at [facility], [address] [place], [place] [address]    Culture >=100,000 COLONIES/mL ESCHERICHIA COLI (A)  Final   Report Status 06/13/2020 FINAL  Final   Organism ID, Bacteria ESCHERICHIA COLI (A)  Final      Susceptibility   Escherichia coli - MIC*    AMPICILLIN <=2 SENSITIVE Sensitive     CEFAZOLIN <=4  SENSITIVE Sensitive     CEFEPIME <=0.12 SENSITIVE Sensitive     CEFTRIAXONE <=0.25 SENSITIVE Sensitive     CIPROFLOXACIN <=0.25 SENSITIVE Sensitive     GENTAMICIN <=1 SENSITIVE Sensitive     IMIPENEM <=0.25 SENSITIVE Sensitive     NITROFURANTOIN <=16 SENSITIVE Sensitive     TRIMETH/SULFA <=20 SENSITIVE Sensitive     AMPICILLIN/SULBACTAM <=2 SENSITIVE Sensitive     PIP/TAZO <=4 SENSITIVE Sensitive     * >=100,000 COLONIES/mL ESCHERICHIA COLI      Radiology  Studies: No results found.   Marzetta Board, MD, PhD [facility]  Between 7 am - 7 pm I am available, please contact me via Amion or Securechat  Between 7 pm - 7 am I am not available, please contact night coverage MD/APP via Amion

## 2020-06-13 NOTE — Evaluation (Signed)
Occupational Therapy Evaluation Patient Details Name: Alexis Lloyd MRN: [id number] DOB: 1961/03/13 Today's Date: 06/13/2020    History of Present Illness 60 year old female with past medical history for type 2 diabetes mellitus, dyslipidemia, hypertension and obstructive sleep apnea who presented with cough and wheezing. Covid vaccination 2/21 and 4/21. Admitted for acute hypoxic respiratory failure due to SARS Covid-19 viral pneumonia.   Clinical Impression   Pt was seen for OT evaluation this date. 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 Pt lives with her spouse and client, ramped entrance and bed/bath access on main floor of the home. Pt currently denies SOB, just reports feeling fatigued. Pt on room air at time of evaluation. Currently pt demonstrates modified independence with ADL tasks and mobility. No overt balance deficits appreciated. Pt educated in energy conservation conservation strategies including pursed lip breathing, activity pacing, work simplification, and incentive spirometer use. Pt verbalized understanding of all education provided. Denies additional needs at this time. Do not anticipate need for additional skilled OT services at this time. Will sign off. Please re-consult if additional needs arise.     Follow Up Recommendations  No OT follow up    Equipment Recommendations  None recommended by OT    Recommendations for Other Services       Precautions / Restrictions Precautions Precautions: None Restrictions Weight Bearing Restrictions: No      Mobility Bed Mobility Overal bed mobility: Modified Independent             General bed mobility comments: sight increased effort    Transfers Overall transfer level: Independent               General transfer comment: no difficulty noted    Balance Overall balance assessment: No apparent balance deficits (not  formally assessed)                                         ADL either performed or assessed with clinical judgement   ADL Overall ADL's : Independent                                             Vision Baseline Vision/History: Wears glasses Wears Glasses: At all times (doesn't have them with her) Patient Visual Report: No change from baseline       Perception     Praxis      Pertinent Vitals/Pain Pain Assessment: No/denies pain     Hand Dominance Left   Extremity/Trunk Assessment Upper Extremity Assessment Upper Extremity Assessment: Overall WFL for tasks assessed   Lower Extremity Assessment Lower Extremity Assessment: Overall WFL for tasks assessed   Cervical / Trunk Assessment Cervical / Trunk Assessment: Normal   Communication Communication Communication: No difficulties   Cognition Arousal/Alertness: Awake/alert Behavior During Therapy: WFL for tasks assessed/performed Overall Cognitive Status: Within Functional Limits for tasks assessed                                     General Comments       Exercises Other Exercises Other Exercises: Pt educated in energy conservation strategies including activity pacing, work simplification, and gradual  return to previous routines Other Exercises: Pt instructed in incentive spirometer and benefits of OOB activity throughout the day   Shoulder Instructions      Home Living Family/patient expects to be discharged to:: Private residence Living Arrangements: Spouse/significant other;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) Available Help at Discharge: Family;Available 24 hours/day Type of Home: House Home Access: Ramped entrance     Home Layout: Two level;Able to live on main level with bedroom/bathroom     Bathroom Shower/Tub: Producer, television/film/video: Standard     Home Equipment: None          Prior Functioning/Environment Level of  Independence: Independent        Comments: Indep, working (cares for adult in her home - bathing, feeding, etc)        OT Problem List: Decreased activity tolerance      OT Treatment/Interventions:      OT Goals(Current goals can be found in the care plan section) Acute Rehab OT Goals Patient Stated Goal: go home OT Goal Formulation: All assessment and education complete, DC therapy  OT Frequency:     Barriers to D/C:            Co-evaluation              AM-PAC OT "6 Clicks" Daily Activity     Outcome Measure Help from another person eating meals?: None Help from another person taking care of personal grooming?: None Help from another person toileting, which includes using toliet, bedpan, or urinal?: None Help from another person bathing (including washing, rinsing, drying)?: None Help from another person to put on and taking off regular upper body clothing?: None Help from another person to put on and taking off regular lower body clothing?: None 6 Click Score: 24   End of Session Equipment Utilized During Treatment: Gait belt  Activity Tolerance: Patient tolerated treatment well Patient left: in chair;with call bell/phone within reach;with nursing/sitter in room  OT Visit Diagnosis: Other abnormalities of gait and mobility (R26.89)                Time: 4010-2725 OT Time Calculation (min): 20 min Charges:  OT General Charges $OT Visit: 1 Visit OT Evaluation $OT Eval Low Complexity: 1 Low OT Treatments $Self Care/Home Management : 8-22 mins  Richrd Prime, MPH, MS, OTR/L ascom [phone] 06/13/20, 10:13 AM

## 2020-06-13 NOTE — Evaluation (Signed)
Physical Therapy Evaluation Patient Details Name: Alexis Lloyd MRN: [id number] DOB: April 20, 1961 Today's Date: 06/13/2020   History of Present Illness  60 year old female with past medical history for type 2 diabetes mellitus, dyslipidemia, hypertension and obstructive sleep apnea who presented with cough and wheezing. Covid vaccination 2/21 and 4/21. Admitted for acute hypoxic respiratory failure due to SARS Covid-19 viral pneumonia.  Clinical Impression  Pt admitted with above diagnosis. Pt currently with functional limitations due to the deficits listed below (see "PT Problem List"). Upon entry, pt in recliner, awake and agreeable to participate. The pt is alert, pleasant, interactive, and able to provide info regarding prior level of function, both in tolerance and independence. Patient's performance this date reveals mildly decreased ability and tolerance in performing basic mobility required for performance of activities of daily living, however appears safe, and requires no physical assistance. VSS on room air. Patient is at baseline level of independence, all education completed, and time is given to address all questions/concerns. No additional skilled PT services needed at this time, PT signing off. PT recommends daily ambulation ad lib or with nursing staff as needed to prevent deconditioning.       Follow Up Recommendations No PT follow up    Equipment Recommendations  None recommended by PT    Recommendations for Other Services       Precautions / Restrictions Precautions Precautions: None Restrictions Weight Bearing Restrictions: No      Mobility  Bed Mobility Overal bed mobility: Independent             General bed mobility comments: per pt reports; in chair at entry    Transfers Overall transfer level: Independent Equipment used: None             General transfer comment: no difficulty noted  Ambulation/Gait Ambulation/Gait assistance: Modified  independent (Device/Increase time) Gait Distance (Feet): 120 Feet Assistive device: IV Pole       General Gait Details: mild fatigue limitations, minimal exertion, VSS on room air, no LOB  Stairs            Wheelchair Mobility    Modified Rankin (Stroke Patients Only)       Balance Overall balance assessment: Independent                                           Pertinent Vitals/Pain Pain Assessment: No/denies pain    Home Living Family/patient expects to be discharged to:: Private residence Living Arrangements: Spouse/significant other;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) Available Help at Discharge: Family;Available 24 hours/day Type of Home: House Home Access: Ramped entrance     Home Layout: Two level;Able to live on main level with bedroom/bathroom Home Equipment: None      Prior Function Level of Independence: Independent         Comments: Indep, working (cares for adult in her home - bathing, feeding, etc)     Hand Dominance   Dominant Hand: Left    Extremity/Trunk Assessment   Upper Extremity Assessment Upper Extremity Assessment: Overall WFL for tasks assessed    Lower Extremity Assessment Lower Extremity Assessment: Overall WFL for tasks assessed    Cervical / Trunk Assessment Cervical / Trunk Assessment: Normal  Communication   Communication: No difficulties  Cognition Arousal/Alertness: Awake/alert Behavior During Therapy: WFL for tasks assessed/performed Overall Cognitive Status: Within Functional Limits for tasks assessed  General Comments      Exercises Other Exercises Other Exercises: Pt educated in energy conservation strategies including activity pacing, work simplification, and gradual return to previous routines Other Exercises: Pt instructed in incentive spirometer and benefits of OOB activity throughout the day   Assessment/Plan    PT  Assessment Patent does not need any further PT services  PT Problem List         PT Treatment Interventions      PT Goals (Current goals can be found in the Care Plan section)  Acute Rehab PT Goals Patient Stated Goal: go home PT Goal Formulation: All assessment and education complete, DC therapy    Frequency     Barriers to discharge        Co-evaluation               AM-PAC PT "6 Clicks" Mobility  Outcome Measure Help needed turning from your back to your side while in a flat bed without using bedrails?: None Help needed moving from lying on your back to sitting on the side of a flat bed without using bedrails?: None Help needed moving to and from a bed to a chair (including a wheelchair)?: None Help needed standing up from a chair using your arms (e.g., wheelchair or bedside chair)?: None Help needed to walk in hospital room?: None Help needed climbing 3-5 steps with a railing? : None 6 Click Score: 24    End of Session   Activity Tolerance: Patient tolerated treatment well;No increased pain Patient left: with call bell/phone within reach;Other (comment) (in BR)   PT Visit Diagnosis: Difficulty in walking, not elsewhere classified (R26.2)    Time: 1117-1130 PT Time Calculation (min) (ACUTE ONLY): 13 min   Charges:   PT Evaluation $PT Eval Low Complexity: 1 Low          12:23 PM, 06/13/20 Rosamaria Lints, PT, DPT Physical Therapist - [facility]  [phone] (ASCOM)   Latrel Szymczak C 06/13/2020, 12:21 PM

## 2020-06-14 DIAGNOSIS — J1282 Pneumonia due to coronavirus disease 2019: Secondary | ICD-10-CM | POA: Diagnosis not present

## 2020-06-14 DIAGNOSIS — U071 COVID-19: Secondary | ICD-10-CM | POA: Diagnosis not present

## 2020-06-14 DIAGNOSIS — E1143 Type 2 diabetes mellitus with diabetic autonomic (poly)neuropathy: Secondary | ICD-10-CM | POA: Diagnosis not present

## 2020-06-14 DIAGNOSIS — E669 Obesity, unspecified: Secondary | ICD-10-CM | POA: Diagnosis not present

## 2020-06-14 LAB — COMPREHENSIVE METABOLIC PANEL
ALT: 21 U/L (ref 0–44)
AST: 11 U/L — ABNORMAL LOW (ref 15–41)
Albumin: 2.8 g/dL — ABNORMAL LOW (ref 3.5–5.0)
Alkaline Phosphatase: 46 U/L (ref 38–126)
Anion gap: 7 (ref 5–15)
BUN: 20 mg/dL (ref 8–23)
CO2: 27 mmol/L (ref 22–32)
Calcium: 8.7 mg/dL — ABNORMAL LOW (ref 8.9–10.3)
Chloride: 101 mmol/L (ref 98–111)
Creatinine, Ser: 0.56 mg/dL (ref 0.44–1.00)
GFR, Estimated: >60 mL/min (ref >60)
Glucose, Bld: 193 mg/dL — ABNORMAL HIGH (ref 70–99)
Potassium: 4.2 mmol/L (ref 3.5–5.1)
Sodium: 135 mmol/L (ref 135–145)
Total Bilirubin: 0.5 mg/dL (ref 0.3–1.2)
Total Protein: 6.8 g/dL (ref 6.5–8.1)

## 2020-06-14 LAB — GLUCOSE, CAPILLARY
Glucose-Capillary: 108 mg/dL — ABNORMAL HIGH (ref 70–99)
Glucose-Capillary: 183 mg/dL — ABNORMAL HIGH (ref 70–99)
Glucose-Capillary: 193 mg/dL — ABNORMAL HIGH (ref 70–99)
Glucose-Capillary: 245 mg/dL — ABNORMAL HIGH (ref 70–99)

## 2020-06-14 LAB — FERRITIN: Ferritin: 201 ng/mL (ref 11–307)

## 2020-06-14 LAB — D-DIMER, QUANTITATIVE: D-Dimer, Quant: 0.52 ug/mL-FEU — ABNORMAL HIGH (ref 0.00–0.50)

## 2020-06-14 LAB — C-REACTIVE PROTEIN: CRP: 1.2 mg/dL — ABNORMAL HIGH (ref <1.0)

## 2020-06-14 MED ORDER — LISINOPRIL 20 MG PO TABS
20.0000 mg | ORAL_TABLET | Freq: Every day | ORAL | Status: DC
  Administered 2020-06-14 – 2020-06-15 (×2): 20 mg via ORAL
  Filled 2020-06-14 (×2): qty 1

## 2020-06-14 NOTE — Progress Notes (Signed)
PROGRESS NOTE  Alexis Lloyd [id number] DOB: 07/08/1960 DOA: 06/10/2020 PCP: Margaretann Loveless, PA-C   LOS: 4 days   Brief Narrative / Interim history: 60 year old female with past medical history for type 2 diabetes mellitus, dyslipidemia, hypertension and obstructive sleep apnea who presented with cough and wheezing. Reported 48 hours of wheezing, dry cough, generalized weakness, chills, fevers, loss of taste and smell.  She was found to be hypoxic, admitted to the hospital and started on remdesivir along with steroids  Subjective / 24h Interval events: Better, not yet back to baseline  Assessment & Plan:  Principal Problem Acute Hypoxic Respiratory Failure due to Covid-19 Viral Illness -Continue to wean off as tolerated, minimal requirements currently.  Started on remdesivir, steroids, to finish remdesivir tomorrow and then home afterwards   COVID-19 Labs  Recent Labs    06/12/20 0700 06/13/20 0402 06/14/20 0445  DDIMER 1.85* 0.73* 0.52*  FERRITIN 251 249 201  CRP 1.1* 1.3* 1.2*    Lab Results  Component Value Date   SARSCOV2NAA POSITIVE (A) 06/10/2020   SARSCOV2NAA Not Detected 07/08/2019   Active Problems Hypertension -Blood pressure stable, blood pressure slightly higher today, resume home lisinopril  Obesity -Based on BMI of 38, should benefit from weight loss  Hyponatremia -Improved with fluids  DM2, steroid-induced hyperglycemia -Continue sliding scale, CBGs acceptable  CBG (last 3)  Recent Labs    06/13/20 1927 06/14/20 0750 06/14/20 1238  GLUCAP 187* 108* 183*     Scheduled Meds: . vitamin C  500 mg Oral Daily  . aspirin EC  81 mg Oral Daily  . chlorpheniramine-HYDROcodone  5 mL Oral Q12H  . cholecalciferol  1,000 Units Oral Daily  . dexamethasone  6 mg Oral Daily  . enoxaparin (LOVENOX) injection  0.5 mg/kg Subcutaneous Q24H  . guaiFENesin  600 mg Oral BID  . insulin aspart  0-20 Units Subcutaneous TID AC & HS  . linagliptin  5  mg Oral Daily  . loratadine  10 mg Oral Daily  . montelukast  10 mg Oral Daily  . pantoprazole  40 mg Oral Daily  . pioglitazone  45 mg Oral Daily  . zinc sulfate  220 mg Oral Daily   Continuous Infusions: . remdesivir 100 mg in NS 100 mL 100 mg (06/14/20 0835)   PRN Meds:.acetaminophen, albuterol, guaiFENesin-dextromethorphan, magnesium hydroxide, ondansetron **OR** ondansetron (ZOFRAN) IV, SUMAtriptan, traZODone  DVT prophylaxis: Lovenox Code Status: Full code Family Communication: d/w patient    Status is: Inpatient  Remains inpatient appropriate because:Inpatient level of care appropriate due to severity of illness   Dispo: The patient is from: Home              Anticipated d/c is to: Home              Anticipated d/c date is: 1 day              Patient currently is not medically stable to d/c.  Consultants:  None   Procedures:  None   Microbiology: None   Antibacterials: None    Objective: Vitals:   06/13/20 1930 06/14/20 0011 06/14/20 0346 06/14/20 0754  BP: (!) 148/65 (!) 141/70 (!) 150/71 123/60  Pulse: 77 (!) 59 (!) 58 (!) 59  Resp: 18 17 16 17   Temp: 98.1 F (36.7 C) 97.9 F (36.6 C) 98.2 F (36.8 C) 98.6 F (37 C)  TempSrc:  Oral  Oral  SpO2: 99% 100% 100% 99%  Weight:      Height:  No intake or output data in the 24 hours ending 06/14/20 1243 Filed Weights   06/10/20 1747  Weight: 101.2 kg    Examination:  Constitutional: nad Eyes: no icterus ENMT: mmm Neck: normal, supple Respiratory: faint rhonchi, no wheezing, moves air well Cardiovascular: RRR, no mrg, no edema Abdomen: soft, nt, nd, bs+ Musculoskeletal: no clubbing / cyanosis.  Skin: no rashes Neurologic: non focal   Data Reviewed: I have independently reviewed following labs and imaging studies   CBC: Recent Labs  Lab 06/10/20 1751  WBC 10.7*  NEUTROABS 8.4*  HGB 11.2*  HCT 33.9*  MCV 88.1  PLT [id number]   Basic Metabolic Panel: Recent Labs  Lab 06/10/20 1751  06/12/20 0700 06/13/20 0402 06/14/20 0445  NA 133* 136 136 135  K 3.3* 4.4 4.2 4.2  CL 96* 102 105 101  CO2 27 27 25 27   GLUCOSE 58* 145* 123* 193*  BUN 18 26* 27* 20  CREATININE 0.94 0.70 0.75 0.56  CALCIUM 9.4 8.8* 8.4* 8.7*   GFR: Estimated Creatinine Clearance: 85.5 mL/min (by C-G formula based on SCr of 0.56 mg/dL). Liver Function Tests: Recent Labs  Lab 06/10/20 1751 06/12/20 0700 06/13/20 0402 06/14/20 0445  AST 19 14* 12* 11*  ALT 27 24 23 21   ALKPHOS 68 54 47 46  BILITOT 0.8 0.6 0.5 0.5  PROT 7.8 7.2 6.5 6.8  ALBUMIN 3.7 3.1* 2.9* 2.8*   No results for input(s): LIPASE, AMYLASE in the last 168 hours. No results for input(s): AMMONIA in the last 168 hours. Coagulation Profile: No results for input(s): INR, PROTIME in the last 168 hours. Cardiac Enzymes: No results for input(s): CKTOTAL, CKMB, CKMBINDEX, TROPONINI in the last 168 hours. BNP (last 3 results) No results for input(s): PROBNP in the last 8760 hours. HbA1C: No results for input(s): HGBA1C in the last 72 hours. CBG: Recent Labs  Lab 06/13/20 1122 06/13/20 1637 06/13/20 1927 06/14/20 0750 06/14/20 1238  GLUCAP 161* 211* 187* 108* 183*   Lipid Profile: No results for input(s): CHOL, HDL, LDLCALC, TRIG, CHOLHDL, LDLDIRECT in the last 72 hours. Thyroid Function Tests: No results for input(s): TSH, T4TOTAL, FREET4, T3FREE, THYROIDAB in the last 72 hours. Anemia Panel: Recent Labs    06/13/20 0402 06/14/20 0445  FERRITIN 249 201   Urine analysis:    Component Value Date/Time   COLORURINE YELLOW (A) 06/11/2020 0054   APPEARANCEUR CLEAR (A) 06/11/2020 0054   APPEARANCEUR Cloudy (A) 07/16/2015 0000   LABSPEC 1.028 06/11/2020 0054   LABSPEC 1.008 03/30/2013 2057   PHURINE 5.0 06/11/2020 0054   GLUCOSEU NEGATIVE 06/11/2020 0054   GLUCOSEU >=500 03/30/2013 2057   HGBUR SMALL (A) 06/11/2020 0054   BILIRUBINUR NEGATIVE 06/11/2020 0054   BILIRUBINUR Negative 02/09/2020 1157   BILIRUBINUR  Negative 07/16/2015 0000   BILIRUBINUR Negative 03/30/2013 2057   KETONESUR NEGATIVE 06/11/2020 0054   PROTEINUR NEGATIVE 06/11/2020 0054   UROBILINOGEN 0.2 02/09/2020 1157   NITRITE POSITIVE (A) 06/11/2020 0054   LEUKOCYTESUR NEGATIVE 06/11/2020 0054   LEUKOCYTESUR Negative 03/30/2013 2057   Sepsis Labs: Invalid input(s): PROCALCITONIN, LACTICIDVEN  Recent Results (from the past 240 hour(s))  Resp Panel by RT-PCR (Flu A&B, Covid) Nasopharyngeal Swab     Status: Abnormal   Collection Time: 06/10/20  8:17 PM   Specimen: Nasopharyngeal Swab; Nasopharyngeal(NP) swabs in vial transport medium  Result Value Ref Range Status   SARS Coronavirus 2 by RT PCR POSITIVE (A) NEGATIVE Final    Comment: RESULT CALLED TO, READ BACK BY  AND VERIFIED WITH: ASHTON PETERS 06/10/20 AT 2113 BY ACR (NOTE) SARS-CoV-2 target nucleic acids are DETECTED.  The SARS-CoV-2 RNA is generally detectable in upper respiratory specimens during the acute phase of infection. Positive results are indicative of the presence of the identified virus, but do not rule out bacterial infection or co-infection with other pathogens not detected by the test. Clinical correlation with patient history and other diagnostic information is necessary to determine patient infection status. The expected result is Negative.  Fact Sheet for Patients: BloggerCourse.com  Fact Sheet for Healthcare Providers: SeriousBroker.it  This test is not yet approved or cleared by the [place] FDA and  has been authorized for detection and/or diagnosis of SARS-CoV-2 by FDA under an Emergency Use Authorization (EUA).  This EUA will remain in effect (meaning this test can  be used) for the duration of  the COVID-19 declaration under Section 564(b)(1) of the Act, 21 U.S.C. section 360bbb-3(b)(1), unless the authorization is terminated or revoked sooner.     Influenza A by PCR NEGATIVE NEGATIVE  Final   Influenza B by PCR NEGATIVE NEGATIVE Final    Comment: (NOTE) The Xpert Xpress SARS-CoV-2/FLU/RSV plus assay is intended as an aid in the diagnosis of influenza from Nasopharyngeal swab specimens and should not be used as a sole basis for treatment. Nasal washings and aspirates are unacceptable for Xpert Xpress SARS-CoV-2/FLU/RSV testing.  Fact Sheet for Patients: BloggerCourse.com  Fact Sheet for Healthcare Providers: SeriousBroker.it  This test is not yet approved or cleared by the [place] FDA and has been authorized for detection and/or diagnosis of SARS-CoV-2 by FDA under an Emergency Use Authorization (EUA). This EUA will remain in effect (meaning this test can be used) for the duration of the COVID-19 declaration under Section 564(b)(1) of the Act, 21 U.S.C. section 360bbb-3(b)(1), unless the authorization is terminated or revoked.  Performed at [facility], [address] [place], [place] [address]   Urine Culture     Status: Abnormal   Collection Time: 06/11/20 12:54 AM   Specimen: Urine, Random  Result Value Ref Range Status   Specimen Description   Final    URINE, RANDOM Performed at [facility], [address], [place], [place] [address]    Special Requests   Final    NONE Performed at [facility], [address] [place], [place] [address]    Culture >=100,000 COLONIES/mL ESCHERICHIA COLI (A)  Final   Report Status 06/13/2020 FINAL  Final   Organism ID, Bacteria ESCHERICHIA COLI (A)  Final      Susceptibility   Escherichia coli - MIC*    AMPICILLIN <=2 SENSITIVE Sensitive     CEFAZOLIN <=4 SENSITIVE Sensitive     CEFEPIME <=0.12 SENSITIVE Sensitive     CEFTRIAXONE <=0.25 SENSITIVE Sensitive     CIPROFLOXACIN <=0.25 SENSITIVE Sensitive     GENTAMICIN <=1 SENSITIVE Sensitive     IMIPENEM <=0.25 SENSITIVE Sensitive     NITROFURANTOIN <=16 SENSITIVE Sensitive      TRIMETH/SULFA <=20 SENSITIVE Sensitive     AMPICILLIN/SULBACTAM <=2 SENSITIVE Sensitive     PIP/TAZO <=4 SENSITIVE Sensitive     * >=100,000 COLONIES/mL ESCHERICHIA COLI      Radiology Studies: No results found.   Pamella Pert, MD, PhD [facility]  Between 7 am - 7 pm I am available, please contact me via Amion or Securechat  Between 7 pm - 7 am I am not available, please contact night coverage MD/APP via Amion

## 2020-06-14 NOTE — Care Management Important Message (Signed)
Important Message  Patient Details  Name: Alexis Lloyd MRN: [id number] Date of Birth: 03-31-1961   Medicare Important Message Given:  Yes     Allayne Butcher, RN 06/14/2020, 1:26 PM

## 2020-06-15 ENCOUNTER — Ambulatory Visit: Payer: Self-pay | Admitting: *Deleted

## 2020-06-15 DIAGNOSIS — U071 COVID-19: Secondary | ICD-10-CM | POA: Diagnosis not present

## 2020-06-15 DIAGNOSIS — E669 Obesity, unspecified: Secondary | ICD-10-CM | POA: Diagnosis not present

## 2020-06-15 DIAGNOSIS — E1143 Type 2 diabetes mellitus with diabetic autonomic (poly)neuropathy: Secondary | ICD-10-CM | POA: Diagnosis not present

## 2020-06-15 DIAGNOSIS — R059 Cough, unspecified: Secondary | ICD-10-CM

## 2020-06-15 LAB — COMPREHENSIVE METABOLIC PANEL
ALT: 22 U/L (ref 0–44)
AST: 11 U/L — ABNORMAL LOW (ref 15–41)
Albumin: 2.8 g/dL — ABNORMAL LOW (ref 3.5–5.0)
Alkaline Phosphatase: 45 U/L (ref 38–126)
Anion gap: 7 (ref 5–15)
BUN: 21 mg/dL (ref 8–23)
CO2: 26 mmol/L (ref 22–32)
Calcium: 8.4 mg/dL — ABNORMAL LOW (ref 8.9–10.3)
Chloride: 104 mmol/L (ref 98–111)
Creatinine, Ser: 0.62 mg/dL (ref 0.44–1.00)
GFR, Estimated: >60 mL/min (ref >60)
Glucose, Bld: 173 mg/dL — ABNORMAL HIGH (ref 70–99)
Potassium: 4 mmol/L (ref 3.5–5.1)
Sodium: 137 mmol/L (ref 135–145)
Total Bilirubin: 0.6 mg/dL (ref 0.3–1.2)
Total Protein: 6.4 g/dL — ABNORMAL LOW (ref 6.5–8.1)

## 2020-06-15 LAB — C-REACTIVE PROTEIN: CRP: 1.4 mg/dL — ABNORMAL HIGH (ref <1.0)

## 2020-06-15 LAB — FERRITIN: Ferritin: 196 ng/mL (ref 11–307)

## 2020-06-15 LAB — D-DIMER, QUANTITATIVE: D-Dimer, Quant: 0.52 ug/mL-FEU — ABNORMAL HIGH (ref 0.00–0.50)

## 2020-06-15 LAB — GLUCOSE, CAPILLARY: Glucose-Capillary: 113 mg/dL — ABNORMAL HIGH (ref 70–99)

## 2020-06-15 MED ORDER — DEXAMETHASONE 6 MG PO TABS: 6.0000 mg | ORAL_TABLET | Freq: Every day | ORAL | 0 refills | Status: AC

## 2020-06-15 MED ORDER — GUAIFENESIN-DM 100-10 MG/5ML PO SYRP: 10.0000 mL | ORAL_SOLUTION | ORAL | 0 refills | Status: DC | PRN

## 2020-06-15 NOTE — Discharge Summary (Signed)
Physician Discharge Summary  LAKEISA HENINGER AVW:[id number] DOB: Mar 25, 1961 DOA: 06/10/2020  PCP: Mar Daring, PA-C  Admit date: 06/10/2020 Discharge date: 06/15/2020  Admitted From: home Disposition:  home  Recommendations for Outpatient Follow-up:  1. Follow up with PCP in 1-2 weeks  Home Health: none Equipment/Devices: none  Discharge Condition: stalbe CODE STATUS: Full code Diet recommendation: diabetic  HPI: Per admitting MD, Camia Dipinto  is a 60 y.o. African-American female with a known history of hypertension, dyslipidemia, type 2 diabetes mellitus and sleep apnea, who presented to the emergency room with acute onset of cough and wheezing since Saturday.  She admitted to fever and chills as well as loss of taste and smell, generalized weakness and lack of appetite.  No nausea or vomiting or diarrhea.  No chest pain or palpitations.  No headache or dizziness or blurred vision.  She has been vaccinated against Covid 19 on 2/21 and 09/13/2019.  She has been having urinary frequency and urgency with offensive urine order with lower abdominal pain. Upon presentation to the ER, her temperature was 103.3 with blood pressure of 114/37 heart rate of 123 and respiratory rate was 20 with a pulse oximetry of 99% on 2 L of O2 by nasal cannula after dropping to the 80s on room air.  Labs revealed mild hyponatremia and hypochloremia, hypokalemia hypoglycemia 58 with otherwise normal CMP.  CBC showed WBC of 10.7 mild anemia.  COVID-19 PCR came back positive and influenza antigens were negative.  Urinalysis showed 6-10 WBCs with rare bacteria and positive nitrite.  Chest x-ray showed possible right perihilar infiltrate abdominal and pelvic CT scan showed hepatic steatosis with no acute abnormalities.  Chest CTA revealed no evidence for PE or aortic dissection and clear lung fields.  It showed however pulmonary nodules measuring up to 7 mm in the left lower lobe with recommendation for noncontrast  chest CT at 3 to 6 months and if the nodules are stable then future CT at 18 to 24 months.  It also showed aortic atherosclerosis. The patient was given 650 mg p.o. Tylenol, 10 mg of IV Decadron, 4 mg of IV Zofran, 1 L bolus of IV normal saline and IV remdesivir.  She will be admitted to a medical monitored bed for further evaluation and management.  Hospital Course / Discharge diagnoses: Principal Problem Acute Hypoxic Respiratory Failure due to Covid-19 Viral Illness -she was placed on remdesivir, completed 5 dys while hospitalized, also started on steroids. Clinically she has improved, she was weaned off to room air, able to ambulate in the room without difficulties, and will be discharged home in stable condition with outpatient follow up. She will complete 5 additional days of decadron as an outpatient.   Hypertension -resume home medications Obesity -Based on BMI of 38, should benefit from weight loss Hyponatremia -Improved with fluids DM2, steroid-induced hyperglycemia -resume home medications Bacteriuria - no symptoms  Sepsis ruled out   Discharge Instructions   Allergies as of 06/15/2020      Reactions   Allegra [fexofenadine] Nausea And Vomiting   Avelox [moxifloxacin] Other (See Comments)   Hallucinations   Bactrim [sulfamethoxazole-trimethoprim] Other (See Comments)   unknown   Etodolac Nausea Only   Fish-derived Products Nausea And Vomiting   With "seafood"   Penicillins Diarrhea, Nausea And Vomiting   Shellfish Allergy Nausea Only   Sulfa Antibiotics Other (See Comments)   unknown   Macrobid [nitrofurantoin Monohyd Macro] Other (See Comments)   Constipation and globus hystericus.  Medication List    TAKE these medications   albuterol 108 (90 Base) MCG/ACT inhaler Commonly known as: Proventil HFA Inhale 2 puffs into the lungs every 6 (six) hours as needed.   cetirizine 10 MG tablet Commonly known as: ZYRTEC Take 1 tablet (10 mg total) by mouth daily.    dexamethasone 6 MG tablet Commonly known as: DECADRON Take 1 tablet (6 mg total) by mouth daily for 5 days.   glipiZIDE 10 MG tablet Commonly known as: GLUCOTROL TAKE 1 TABLET BY MOUTH  TWICE DAILY BEFORE A MEAL   guaiFENesin-dextromethorphan 100-10 MG/5ML syrup Commonly known as: ROBITUSSIN DM Take 10 mLs by mouth every 4 (four) hours as needed for cough.   ketoconazole 2 % cream Commonly known as: NIZORAL APPLY CREAM TOPICALLY TWICE DAILY TO FEET   lisinopril-hydrochlorothiazide 20-12.5 MG tablet Commonly known as: ZESTORETIC TAKE 2 TABLETS BY MOUTH  DAILY   montelukast 10 MG tablet Commonly known as: SINGULAIR TAKE 1 TABLET BY MOUTH  DAILY   onetouch ultrasoft lancets To check BS once daily   OneTouch Verio test strip Generic drug: glucose blood To check blood sugar once daily   OneTouch Verio w/Device Kit To check blood sugar once daily   pantoprazole 40 MG tablet Commonly known as: PROTONIX Take 1 tablet (40 mg total) by mouth daily.   pioglitazone 45 MG tablet Commonly known as: Actos Take 1 tablet (45 mg total) by mouth daily.   Rybelsus 3 MG Tabs Generic drug: Semaglutide Take 1 tablet by mouth every morning.   sitaGLIPtin 100 MG tablet Commonly known as: JANUVIA Take 1 tablet (100 mg total) by mouth daily.   spironolactone 25 MG tablet Commonly known as: ALDACTONE Take 1 tablet (25 mg total) by mouth daily.   SUMAtriptan 50 MG tablet Commonly known as: Imitrex Take 1 tablet (50 mg total) by mouth every 2 (two) hours as needed for migraine. No more than 259m in 24 hours   terconazole 0.4 % vaginal cream Commonly known as: Terazol 7 Apply topically to external vaginal tissue at bedtime for itching   verapamil 80 MG tablet Commonly known as: CALAN TAKE 1 TABLET BY MOUTH  TWICE DAILY       Follow-up Information    BMar Daring PA-C. Schedule an appointment as soon as possible for a visit in 2 week(s).   Specialty: Family  Medicine Contact information: [phone]              Consultations:  None   Procedures/Studies:  DG Chest 2 View  Result Date: 06/10/2020 CLINICAL DATA:  Fever EXAM: CHEST - 2 VIEW COMPARISON:  08/02/2016 FINDINGS: Suboptimal image quality. Possible right perihilar infiltrate on the frontal view not confirmed on the lateral view. No heart failure or effusion. Left lung clear. IMPRESSION: Suboptimal quality study. Possible right perihilar infiltrate. Recommend follow-up chest x-ray to confirm. Electronically Signed   By: CFranchot GalloM.D.   On: 06/10/2020 18:32   CT Angio Chest PE W/Cm &/Or Wo Cm  Result Date: 06/10/2020 CLINICAL DATA:  Body aches fever cough EXAM: CT ANGIOGRAPHY CHEST WITH CONTRAST TECHNIQUE: Multidetector CT imaging of the chest was performed using the standard protocol during bolus administration of intravenous contrast. Multiplanar CT image reconstructions and MIPs were obtained to evaluate the vascular anatomy. CONTRAST:  104mOMNIPAQUE IOHEXOL 350 MG/ML SOLN COMPARISON:  Chest x-ray 06/10/2020, CT 02/21/2018 FINDINGS: Cardiovascular: Satisfactory opacification of the pulmonary arteries to the segmental level.  No evidence of pulmonary embolism. Nonaneurysmal aorta. Mild aortic atherosclerosis. Borderline to mild cardiomegaly. No pericardial effusion Mediastinum/Nodes: Midline trachea. No thyroid mass. Subcentimeter mediastinal lymph nodes. Esophagus within normal limits. Stable cardio phrenic lymph nodes. Lungs/Pleura: No acute consolidation or pleural effusion. No pneumothorax. Right middle lobe lung nodule adjacent to the fissure, series 4, image number 40. Subpleural left lower lobe pulmonary nodule measuring 7 mm on average, series 4, image number 44, may be slightly increased in size but was incompletely visualized. Upper Abdomen: Hepatic steatosis.  No acute abnormality. Musculoskeletal: Mild mild diffuse  increased bone density without acute osseous abnormality. Review of the MIP images confirms the above findings. IMPRESSION: 1. Negative for acute pulmonary embolus or aortic dissection. Clear lung fields. 2. Pulmonary nodules measuring up to 7 mm in the left lower lobe. Non-contrast chest CT at 3-6 months is recommended. If the nodules are stable at time of repeat CT, then future CT at 18-24 months (from today's scan) is considered optional for low-risk patients, but is recommended for high-risk patients. This recommendation follows the consensus statement: Guidelines for Management of Incidental Pulmonary Nodules Detected on CT Images: From the Fleischner Society 2017; Radiology 2017; 284:228-243. 3. Hepatic steatosis Aortic Atherosclerosis (ICD10-I70.0). Electronically Signed   By: Donavan Foil M.D.   On: 06/10/2020 22:08   CT Abdomen Pelvis W Contrast  Result Date: 06/10/2020 CLINICAL DATA:  Abdomen pain and fever EXAM: CT ABDOMEN AND PELVIS WITH CONTRAST TECHNIQUE: Multidetector CT imaging of the abdomen and pelvis was performed using the standard protocol following bolus administration of intravenous contrast. CONTRAST:  166m OMNIPAQUE IOHEXOL 350 MG/ML SOLN COMPARISON:  CT 02/21/2018 FINDINGS: Lower chest: Lung bases demonstrate dependent atelectasis or scarring. No acute consolidation or pleural effusion. Mild cardiomegaly. Hepatobiliary: Hepatic steatosis. Status post cholecystectomy. No biliary dilatation. Pancreas: Unremarkable. No pancreatic ductal dilatation or surrounding inflammatory changes. Spleen: Normal in size without focal abnormality. Adrenals/Urinary Tract: Adrenal glands are unremarkable. Kidneys are normal, without renal calculi, focal lesion, or hydronephrosis. Bladder is unremarkable. Stomach/Bowel: Stomach is within normal limits. Appendix appears normal. No evidence of bowel wall thickening, distention, or inflammatory changes. Vascular/Lymphatic: Mild aortic atherosclerosis. No  aneurysm. No suspicious nodes Reproductive: Status post hysterectomy. No adnexal masses. Other: Negative for free air or free fluid. Musculoskeletal: No acute or significant osseous findings. IMPRESSION: 1. No CT evidence for acute intra-abdominal or pelvic abnormality. 2. Hepatic steatosis. Aortic Atherosclerosis (ICD10-I70.0). Electronically Signed   By: KDonavan FoilM.D.   On: 06/10/2020 21:59      Subjective: - no chest pain, shortness of breath, no abdominal pain, nausea or vomiting.   Discharge Exam: BP 139/81 (BP Location: Right Arm)   Pulse (!) 58   Temp 98.5 F (36.9 C)   Resp 18   Ht 5' 4" (1.626 m)   Wt 101.2 kg   SpO2 100%   BMI 38.28 kg/m   General: Pt is alert, awake, not in acute distress Cardiovascular: RRR, S1/S2 +, no rubs, no gallops Respiratory: CTA bilaterally, no wheezing, no rhonchi Abdominal: Soft, NT, ND, bowel sounds + Extremities: no edema, no cyanosis    The results of significant diagnostics from this hospitalization (including imaging, microbiology, ancillary and laboratory) are listed below for reference.     Microbiology: Recent Results (from the past 240 hour(s))  Resp Panel by RT-PCR (Flu A&B, Covid) Nasopharyngeal Swab     Status: Abnormal   Collection Time: 06/10/20  8:17 PM   Specimen: Nasopharyngeal Swab; Nasopharyngeal(NP) swabs in vial transport medium  Result Value Ref Range Status   SARS Coronavirus 2 by RT PCR POSITIVE (A) NEGATIVE Final    Comment: RESULT CALLED TO, READ BACK BY AND VERIFIED WITH: ASHTON PETERS 06/10/20 AT 2113 BY ACR (NOTE) SARS-CoV-2 target nucleic acids are DETECTED.  The SARS-CoV-2 RNA is generally detectable in upper respiratory specimens during the acute phase of infection. Positive results are indicative of the presence of the identified virus, but do not rule out bacterial infection or co-infection with other pathogens not detected by the test. Clinical correlation with patient history and other  diagnostic information is necessary to determine patient infection status. The expected result is Negative.  Fact Sheet for Patients: EntrepreneurPulse.com.au  Fact Sheet for Healthcare Providers: IncredibleEmployment.be  This test is not yet approved or cleared by the [place] FDA and  has been authorized for detection and/or diagnosis of SARS-CoV-2 by FDA under an Emergency Use Authorization (EUA).  This EUA will remain in effect (meaning this test can  be used) for the duration of  the COVID-19 declaration under Section 564(b)(1) of the Act, 21 U.S.C. section 360bbb-3(b)(1), unless the authorization is terminated or revoked sooner.     Influenza A by PCR NEGATIVE NEGATIVE Final   Influenza B by PCR NEGATIVE NEGATIVE Final    Comment: (NOTE) The Xpert Xpress SARS-CoV-2/FLU/RSV plus assay is intended as an aid in the diagnosis of influenza from Nasopharyngeal swab specimens and should not be used as a sole basis for treatment. Nasal washings and aspirates are unacceptable for Xpert Xpress SARS-CoV-2/FLU/RSV testing.  Fact Sheet for Patients: EntrepreneurPulse.com.au  Fact Sheet for Healthcare Providers: IncredibleEmployment.be  This test is not yet approved or cleared by the [place] FDA and has been authorized for detection and/or diagnosis of SARS-CoV-2 by FDA under an Emergency Use Authorization (EUA). This EUA will remain in effect (meaning this test can be used) for the duration of the COVID-19 declaration under Section 564(b)(1) of the Act, 21 U.S.C. section 360bbb-3(b)(1), unless the authorization is terminated or revoked.  Performed at [facility], [address] [place], [place] [address]   Urine Culture     Status: Abnormal   Collection Time: 06/11/20 12:54 AM   Specimen: Urine, Random  Result Value Ref Range Status   Specimen Description   Final    URINE,  RANDOM Performed at [facility], [address] [place], [name] [address]    Special Requests   Final    NONE Performed at [facility], [place], [place] [address]    Culture >=100,000 COLONIES/mL ESCHERICHIA COLI (A)  Final   Report Status 06/13/2020 FINAL  Final   Organism ID, Bacteria ESCHERICHIA COLI (A)  Final      Susceptibility   Escherichia coli - MIC*    AMPICILLIN <=2 SENSITIVE Sensitive     CEFAZOLIN <=4 SENSITIVE Sensitive     CEFEPIME <=0.12 SENSITIVE Sensitive     CEFTRIAXONE <=0.25 SENSITIVE Sensitive     CIPROFLOXACIN <=0.25 SENSITIVE Sensitive     GENTAMICIN <=1 SENSITIVE Sensitive     IMIPENEM <=0.25 SENSITIVE Sensitive     NITROFURANTOIN <=16 SENSITIVE Sensitive     TRIMETH/SULFA <=20 SENSITIVE Sensitive     AMPICILLIN/SULBACTAM <=2 SENSITIVE Sensitive     PIP/TAZO <=4 SENSITIVE Sensitive     * >=100,000 COLONIES/mL ESCHERICHIA COLI     Labs: Basic Metabolic Panel: Recent Labs  Lab 06/10/20 1751 06/12/20 0700 06/13/20 0402 06/14/20 0445 06/15/20 0248  NA 133* 136 136 135  137  K 3.3* 4.4 4.2 4.2 4.0  CL 96* 102 105 101 104  CO2 _0 GLUCOSE 58* 145* 123* 193* 173*  BUN 18 26* 27* 20 21  CREATININE 0.94 0.70 0.75 0.56 0.62  CALCIUM 9.4 8.8* 8.4* 8.7* 8.4*   Liver Function Tests: Recent Labs  Lab 06/10/20 1751 06/12/20 0700 06/13/20 0402 06/14/20 0445 06/15/20 0248  AST 19 14* 12* 11* 11*  ALT _1 ALKPHOS 68 54 47 46 45  BILITOT 0.8 0.6 0.5 0.5 0.6  PROT 7.8 7.2 6.5 6.8 6.4*  ALBUMIN 3.7 3.1* 2.9* 2.8* 2.8*   CBC: Recent Labs  Lab 06/10/20 1751  WBC 10.7*  NEUTROABS 8.4*  HGB 11.2*  HCT 33.9*  MCV 88.1  PLT 308   CBG: Recent Labs  Lab 06/14/20 0750 06/14/20 1238 06/14/20 1647 06/14/20 1948 06/15/20 0748  GLUCAP 108* 183* 193* 245* 113*   Hgb A1c No results for input(s): HGBA1C in the last 72 hours. Lipid Profile No results for input(s): CHOL, HDL, LDLCALC,  TRIG, CHOLHDL, LDLDIRECT in the last 72 hours. Thyroid function studies No results for input(s): TSH, T4TOTAL, T3FREE, THYROIDAB in the last 72 hours.  Invalid input(s): FREET3 Urinalysis    Component Value Date/Time   COLORURINE YELLOW (A) 06/11/2020 0054   APPEARANCEUR CLEAR (A) 06/11/2020 0054   APPEARANCEUR Cloudy (A) 07/16/2015 0000   LABSPEC 1.028 06/11/2020 0054   LABSPEC 1.008 03/30/2013 2057   PHURINE 5.0 06/11/2020 0054   GLUCOSEU NEGATIVE 06/11/2020 0054   GLUCOSEU >=500 03/30/2013 2057   HGBUR SMALL (A) 06/11/2020 0054   BILIRUBINUR NEGATIVE 06/11/2020 0054   BILIRUBINUR Negative 02/09/2020 1157   BILIRUBINUR Negative 07/16/2015 0000   BILIRUBINUR Negative 03/30/2013 2057   KETONESUR NEGATIVE 06/11/2020 0054   PROTEINUR NEGATIVE 06/11/2020 0054   UROBILINOGEN 0.2 02/09/2020 1157   NITRITE POSITIVE (A) 06/11/2020 0054   LEUKOCYTESUR NEGATIVE 06/11/2020 0054   LEUKOCYTESUR Negative 03/30/2013 2057    FURTHER DISCHARGE INSTRUCTIONS:   Get Medicines reviewed and adjusted: Please take all your medications with you for your next visit with your Primary MD   Laboratory/radiological data: Please request your Primary MD to go over all hospital tests and procedure/radiological results at the follow up, please ask your Primary MD to get all Hospital records sent to his/her office.   In some cases, they will be blood work, cultures and biopsy results pending at the time of your discharge. Please request that your primary care M.D. goes through all the records of your hospital data and follows up on these results.   Also Note the following: If you experience worsening of your admission symptoms, develop shortness of breath, life threatening emergency, suicidal or homicidal thoughts you must seek medical attention immediately by calling 911 or calling your MD immediately  if symptoms less severe.   You must read complete instructions/literature along with all the possible  adverse reactions/side effects for all the Medicines you take and that have been prescribed to you. Take any new Medicines after you have completely understood and accpet all the possible adverse reactions/side effects.    Do not drive when taking Pain medications or sleeping medications (Benzodaizepines)   Do not take more than prescribed Pain, Sleep and Anxiety Medications. It is not advisable to combine anxiety,sleep and pain medications without talking with your primary care practitioner   Special Instructions: If you have smoked or chewed Tobacco  in the last 2 yrs please stop  smoking, stop any regular Alcohol  and or any Recreational drug use.   Wear Seat belts while driving.   Please note: You were cared for by a hospitalist during your hospital stay. Once you are discharged, your primary care physician will handle any further medical issues. Please note that NO REFILLS for any discharge medications will be authorized once you are discharged, as it is imperative that you return to your primary care physician (or establish a relationship with a primary care physician if you do not have one) for your post hospital discharge needs so that they can reassess your need for medications and monitor your lab values.  Time coordinating discharge: 35 minutes  SIGNED:  Marzetta Board, MD, PhD 06/15/2020, 8:07 AM

## 2020-06-15 NOTE — TOC Transition Note (Signed)
Transition of Care [facility]) - CM/SW Discharge Note   Patient Details  Name: Alexis Lloyd MRN: [id number] Date of Birth: 1961-03-22  [facility] CM/SW Contact:  Shelbie Hutching, RN Phone Number: 06/15/2020, 9:43 AM   Clinical Narrative:    Patient medically cleared for discharge home.  Patient is from home with her husband and independent.  Patient did have some questions about low carb diets for diabetes.  Information on diabetes basics and diabetes and nutrition added to the patient's AVS.           Patient Goals and CMS Choice        Discharge Placement                       Discharge Plan and Services                                     Social Determinants of Health (SDOH) Interventions     Readmission Risk Interventions No flowsheet data found.

## 2020-06-15 NOTE — Telephone Encounter (Signed)
Patient is calling to inform office that she missed her appointment in the office due to hospital [place]. Patient was admitted 06/10/20 due to COVID complications- she is being released today. Advised patient would notify office- please call to schedule hospital follow up when she gets settled at home.   Reason for Disposition . [1] Follow-up call to recent contact AND [2] information only call, no triage required  Answer Assessment - Initial Assessment Questions 1. REASON FOR CALL or QUESTION: "What is your reason for calling today?" or "How can I best help you?" or "What question do you have that I can help answer?"     Patient calling to state she missed appointment- due to hospitailization  Protocols used: [place]

## 2020-06-15 NOTE — Progress Notes (Signed)
Patient is being discharged home.  Discharge papers given and explained to patient.  Pt verbalized understanding.  Meds and f/u appointment reviewed.  Rx sent electronically to the pharmacy.  Patient made aware.

## 2020-06-15 NOTE — Plan of Care (Signed)
Patient alert and oriented, on room air, vitals signs stable, afebrile. Pt denied any SOB, headache, nausea or chest pain. Pt tolerated PO medications. Pt reported to have her sense of taste back after she ate the chicken her hubby bought for her. Falls, contact and Airborne precaution maintained. No acute concerns at this time.  Problem: Coping: Goal: Psychosocial and spiritual needs will be supported Outcome: Progressing   Problem: Respiratory: Goal: Will maintain a patent airway 06/15/2020 0252 by Marylene Buerger, RN Outcome: Progressing 06/15/2020 0252 by Marylene Buerger, RN Outcome: Progressing   Problem: Clinical Measurements: Goal: Respiratory complications will improve Outcome: Progressing   Problem: Pain Managment: Goal: General experience of comfort will improve Outcome: Progressing   Problem: Safety: Goal: Ability to remain free from injury will improve Outcome: Progressing   Problem: Skin Integrity: Goal: Risk for impaired skin integrity will decrease Outcome: Progressing

## 2020-06-15 NOTE — Discharge Instructions (Signed)
Follow with Mar Daring, PA-C in 1-2 weeks  Please get a complete blood count and chemistry panel checked by your Primary MD at your next visit, and again as instructed by your Primary MD. Please get your medications reviewed and adjusted by your Primary MD.  Please request your Primary MD to go over all Hospital Tests and Procedure/Radiological results at the follow up, please get all Hospital records sent to your Prim MD by signing hospital release before you go home.  In some cases, there will be blood work, cultures and biopsy results pending at the time of your discharge. Please request that your primary care M.D. goes through all the records of your hospital data and follows up on these results.  If you had Pneumonia of Lung problems at the Hospital: Please get a 2 view Chest X ray done in 6-8 weeks after hospital discharge or sooner if instructed by your Primary MD.  If you have Congestive Heart Failure: Please call your Cardiologist or Primary MD anytime you have any of the following symptoms:  1) 3 pound weight gain in 24 hours or 5 pounds in 1 week  2) shortness of breath, with or without a dry hacking cough  3) swelling in the hands, feet or stomach  4) if you have to sleep on extra pillows at night in order to breathe  Follow cardiac low salt diet and 1.5 lit/day fluid restriction.  If you have diabetes Accuchecks 4 times/day, Once in AM empty stomach and then before each meal. Log in all results and show them to your primary doctor at your next visit. If any glucose reading is under 80 or above 300 call your primary MD immediately.  If you have Seizure/Convulsions/Epilepsy: Please do not drive, operate heavy machinery, participate in activities at heights or participate in high speed sports until you have seen by Primary MD or a Neurologist and advised to do so again. Per [facility] statutes, patients with seizures are not allowed to drive until they have been  seizure-free for six months.  Use caution when using heavy equipment or power tools. Avoid working on ladders or at heights. Take showers instead of baths. Ensure the water temperature is not too high on the home water heater. Do not go swimming alone. Do not lock yourself in a room alone (i.e. bathroom). When caring for infants or small children, sit down when holding, feeding, or changing them to minimize risk of injury to the child in the event you have a seizure. Maintain good sleep hygiene. Avoid alcohol.   If you had Gastrointestinal Bleeding: Please ask your Primary MD to check a complete blood count within one week of discharge or at your next visit. Your endoscopic/colonoscopic biopsies that are pending at the time of discharge, will also need to followed by your Primary MD.  Get Medicines reviewed and adjusted. Please take all your medications with you for your next visit with your Primary MD  Please request your Primary MD to go over all hospital tests and procedure/radiological results at the follow up, please ask your Primary MD to get all Hospital records sent to his/her office.  If you experience worsening of your admission symptoms, develop shortness of breath, life threatening emergency, suicidal or homicidal thoughts you must seek medical attention immediately by calling 911 or calling your MD immediately  if symptoms less severe.  You must read complete instructions/literature along with all the possible adverse reactions/side effects for all the Medicines you  take and that have been prescribed to you. Take any new Medicines after you have completely understood and accpet all the possible adverse reactions/side effects.   Do not drive or operate heavy machinery when taking Pain medications.   Do not take more than prescribed Pain, Sleep and Anxiety Medications  Special Instructions: If you have smoked or chewed Tobacco  in the last 2 yrs please stop smoking, stop any regular  Alcohol  and or any Recreational drug use.  Wear Seat belts while driving.  Please note You were cared for by a hospitalist during your hospital stay. If you have any questions about your discharge medications or the care you received while you were in the hospital after you are discharged, you can call the unit and asked to speak with the hospitalist on call if the hospitalist that took care of you is not available. Once you are discharged, your primary care physician will handle any further medical issues. Please note that NO REFILLS for any discharge medications will be authorized once you are discharged, as it is imperative that you return to your primary care physician (or establish a relationship with a primary care physician if you do not have one) for your aftercare needs so that they can reassess your need for medications and monitor your lab values.  You can reach the hospitalist office at phone [phone] or fax [phone]   If you do not have a primary care physician, you can call [phone] for a physician referral.  Activity: As tolerated with Full fall precautions use walker/cane & assistance as needed    Diet: dibetic  Disposition Home

## 2020-06-18 ENCOUNTER — Telehealth: Payer: Self-pay

## 2020-06-18 NOTE — Telephone Encounter (Signed)
Transition Care Management Follow-up Telephone Call  Date of discharge and from where: [facility] on [facility]  How have you been since you were released from the hospital? Doing good and feeling much better. Pt is quarantined at home currently and is taking all medications as prescribed. Pt is sleeping well and appetite is normal. Declines any current s/s.  Any questions or concerns? No   Items Reviewed:  Did the pt receive and understand the discharge instructions provided? Yes   Medications obtained and verified? Yes   Any new allergies since your discharge? No   Dietary orders reviewed? Yes  Do you have support at home? Yes   Other (ie: DME, Home Health, etc): N/A  Functional Questionnaire: (I = Independent and D = Dependent)  Bathing/Dressing- I   Meal Prep- I  Eating- I  Maintaining continence- I  Transferring/Ambulation- I  Managing Meds- I   Follow up appointments reviewed:    PCP Hospital f/u appt confirmed? Yes  scheduled a HFU call with Fenton Malling on 06/27/20 @ 11:00 AM.  [facility] f/u appt confirmed? N/A   Are transportation arrangements needed? No   If their condition worsens, is the pt aware to call  their PCP or go to the ED? Yes  Was the patient provided with contact information for the PCP's office or ED? Yes  Was the pt encouraged to call back with questions or concerns? Yes

## 2020-06-18 NOTE — Telephone Encounter (Signed)
No HFU scheduled at this time. 

## 2020-06-27 ENCOUNTER — Other Ambulatory Visit: Payer: Self-pay | Admitting: Physician Assistant

## 2020-06-27 ENCOUNTER — Telehealth: Payer: Self-pay | Admitting: *Deleted

## 2020-06-27 ENCOUNTER — Telehealth (INDEPENDENT_AMBULATORY_CARE_PROVIDER_SITE_OTHER): Payer: Medicare Other | Admitting: Physician Assistant

## 2020-06-27 DIAGNOSIS — E1165 Type 2 diabetes mellitus with hyperglycemia: Secondary | ICD-10-CM

## 2020-06-27 DIAGNOSIS — J9601 Acute respiratory failure with hypoxia: Secondary | ICD-10-CM

## 2020-06-27 DIAGNOSIS — J4520 Mild intermittent asthma, uncomplicated: Secondary | ICD-10-CM

## 2020-06-27 DIAGNOSIS — IMO0002 Reserved for concepts with insufficient information to code with codable children: Secondary | ICD-10-CM

## 2020-06-27 DIAGNOSIS — U071 COVID-19: Secondary | ICD-10-CM

## 2020-06-27 DIAGNOSIS — J1282 Pneumonia due to coronavirus disease 2019: Secondary | ICD-10-CM

## 2020-06-27 DIAGNOSIS — E1140 Type 2 diabetes mellitus with diabetic neuropathy, unspecified: Secondary | ICD-10-CM

## 2020-06-27 NOTE — Chronic Care Management (AMB) (Signed)
  Chronic Care Management   Note  06/27/2020 Name: SUKI CROCKETT MRN: [id number] DOB: 11-05-1960  CHANTAE SOO is a 60 y.o. year old female who is a primary care patient of Rubye Beach. I reached out to [name] by phone today in response to a referral sent by Ms. Ahlam D Seiber's PCP,Burnette, Clearnce Sorrel, PA-C.    Ms. Vasseur was given information about Chronic Care Management services today including:  1. CCM service includes personalized support from designated clinical staff supervised by her physician, including individualized plan of care and coordination with other care providers 2. 24/7 contact phone numbers for assistance for urgent and routine care needs. 3. Service will only be billed when office clinical staff spend 20 minutes or more in a month to coordinate care. 4. Only one practitioner may furnish and bill the service in a calendar month. 5. The patient may stop CCM services at any time (effective at the end of the month) by phone call to the office staff. 6. The patient will be responsible for cost sharing (co-pay) of up to 20% of the service fee (after annual deductible is met).  Patient agreed to services and verbal consent obtained.   Follow up plan: Telephone appointment with care management team member scheduled for:07/23/2020  [place] Management

## 2020-06-27 NOTE — Progress Notes (Signed)
Virtual telephone visit    Virtual Visit via Telephone Note   This visit type was conducted due to national recommendations for restrictions regarding the COVID-19 Pandemic (e.g. social distancing) in an effort to limit this patient's exposure and mitigate transmission in our community. Due to her co-morbid illnesses, this patient is at least at moderate risk for complications without adequate follow up. This format is felt to be most appropriate for this patient at this time. The patient did not have access to video technology or had technical difficulties with video requiring transitioning to audio format only (telephone). Physical exam was limited to content and character of the telephone converstion.    Patient location: Home Provider location: [facility]  I discussed the limitations of evaluation and management by telemedicine and the availability of in person appointments. The patient expressed understanding and agreed to proceed.   Visit Date: 06/27/2020  Today's healthcare provider: Mar Daring, PA-C   Chief Complaint  Patient presents with  . Hospitalization Follow-up  . Covid Positive   Subjective    HPI  Follow up Hospitalization  Patient was admitted to [facility] on 06/10/2020 and discharged on 06/15/2020. She was treated for COVID-19. Treatment for this included IV decadron, IV zofran, tylenol for fevers, fluids. She completed 5 days of Remdesivir and was sent home with 5 days more of decadron. Telephone follow up was done on 06/18/2020 She reports excellent compliance with treatment. She reports this condition is improved.  ----------------------------------------------------------------------------------------- -   Patient Active Problem List   Diagnosis Date Noted  . Sepsis ([place]) 06/11/2020  . Pneumonia due to COVID-19 virus 06/11/2020  . Class 2 obesity 06/11/2020  . T2DM (type 2 diabetes mellitus) ([place]) 06/11/2020  . Acute  hypoxemic respiratory failure due to COVID-19 ([place]) 06/10/2020  . Pain due to onychomycosis of toenails of both feet 10/24/2019  . Benign skin lesion 10/24/2019  . Recurrent UTI 01/22/2018  . Recurrent vaginitis 01/22/2018  . History of Helicobacter pylori infection 11/05/2017  . Intrinsic eczema 11/05/2017  . CLE (columnar lined esophagus)   . Stomach irritation   . Gastric polyp   . Problems with swallowing and mastication   . Heartburn   . Benign neoplasm of transverse colon   . Special screening for malignant neoplasms, colon   . Internal hemorrhoids   . Diverticulosis of large intestine without diverticulitis   . Warts of foot 06/30/2017  . Achilles tendon contracture 01/28/2017  . Hallux rigidus of left foot 01/28/2017  . Hallux rigidus, right foot 01/28/2017  . Pes planus 01/28/2017  . Posterior tibial tendinitis of left lower extremity 01/28/2017  . Posterior tibial tendinitis of right lower extremity 01/28/2017  . Asthma 09/03/2015  . Foot pain 07/16/2015  . DM type 2, uncontrolled, with neuropathy ([place]) 07/13/2015  . Abnormal finding on mammography, microcalcification 04/25/2015  . Intraductal carcinoma of breast 04/24/2015  . Airway hyperreactivity 11/18/2014  . BMI 40.0-44.9, adult ([place]) 11/18/2014  . Breast cyst 11/18/2014  . Diabetic neuropathy ([place]) 11/18/2014  . GERD (gastroesophageal reflux disease) 11/18/2014  . 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 Hypercholesteremia 11/18/2014  . Hypertension 11/18/2014  . Arthritis, degenerative 11/18/2014  . Allergic rhinitis, seasonal 11/18/2014  . OSA (obstructive sleep apnea) 11/18/2014  . AC (acromioclavicular) arthritis 11/08/2014  . Personal history of breast cancer 10/04/2014  . Primary osteoarthritis of right knee 11/14/2013   Past Medical History:  Diagnosis Date  . Allergy   . Arthritis   . Breast cancer ([place])   .  Breast cancer, left breast ([place]) 10/04/2014  . Diabetes mellitus without complication ([place])    . GERD (gastroesophageal reflux disease)   . Hyperlipidemia   . Hypertension   . Osteoporosis   . Sleep apnea    Social History   Tobacco Use  . Smoking status: Former Smoker    Packs/day: 0.25    Years: 21.00    Pack years: 5.25    Quit date: 06/08/1997    Years since quitting: 23.0  . Smokeless tobacco: Never Used  Vaping Use  . Vaping Use: Never used  Substance Use Topics  . Alcohol use: No  . Drug use: No   Allergies  Allergen Reactions  . Allegra [Fexofenadine] Nausea And Vomiting  . Avelox [Moxifloxacin] Other (See Comments)    Hallucinations  . Bactrim [Sulfamethoxazole-Trimethoprim] Other (See Comments)    unknown  . Etodolac Nausea Only  . Fish-Derived Products Nausea And Vomiting    With "seafood"  . Penicillins Diarrhea and Nausea And Vomiting  . Shellfish Allergy Nausea Only  . Sulfa Antibiotics Other (See Comments)    unknown  . Macrobid [employer] Macro] Other (See Comments)    Constipation and globus hystericus.    Medications: Outpatient Medications Prior to Visit  Medication Sig  . albuterol (PROVENTIL HFA) 108 (90 Base) MCG/ACT inhaler Inhale 2 puffs into the lungs every 6 (six) hours as needed.  Marland Kitchen ascorbic Acid (VITAMIN C) 500 MG CPCR Take 500 mg by mouth daily.  . Blood Glucose Monitoring Suppl (ONETOUCH VERIO) w/Device KIT To check blood sugar once daily  . cetirizine (ZYRTEC) 10 MG tablet Take 1 tablet (10 mg total) by mouth daily.  . Cyanocobalamin (VITAMIN B-12) 3000 MCG SUBL Place 1 tablet under the tongue daily at 6 (six) AM. Gummies  . glipiZIDE (GLUCOTROL) 10 MG tablet TAKE 1 TABLET BY MOUTH  TWICE DAILY BEFORE A MEAL  . glucose blood (ONETOUCH VERIO) test strip To check blood sugar once daily  . guaiFENesin-dextromethorphan (ROBITUSSIN DM) 100-10 MG/5ML syrup Take 10 mLs by mouth every 4 (four) hours as needed for cough.  Marland Kitchen ketoconazole (NIZORAL) 2 % cream APPLY CREAM TOPICALLY TWICE DAILY TO FEET  . Lancets (ONETOUCH  ULTRASOFT) lancets To check BS once daily  . lisinopril-hydrochlorothiazide (ZESTORETIC) 20-12.5 MG tablet TAKE 2 TABLETS BY MOUTH  DAILY  . montelukast (SINGULAIR) 10 MG tablet TAKE 1 TABLET BY MOUTH  DAILY  . pantoprazole (PROTONIX) 40 MG tablet Take 1 tablet (40 mg total) by mouth daily.  . pioglitazone (ACTOS) 45 MG tablet Take 1 tablet (45 mg total) by mouth daily.  . RYBELSUS 3 MG TABS Take 1 tablet by mouth every morning.  . sitaGLIPtin (JANUVIA) 100 MG tablet Take 1 tablet (100 mg total) by mouth daily. (Patient not taking: Reported on 06/18/2020)  . spironolactone (ALDACTONE) 25 MG tablet Take 1 tablet (25 mg total) by mouth daily.  . SUMAtriptan (IMITREX) 50 MG tablet Take 1 tablet (50 mg total) by mouth every 2 (two) hours as needed for migraine. No more than 213m in 24 hours  . terconazole (TERAZOL 7) 0.4 % vaginal cream Apply topically to external vaginal tissue at bedtime for itching  . verapamil (CALAN) 80 MG tablet TAKE 1 TABLET BY MOUTH  TWICE DAILY  . zinc gluconate 50 MG tablet Take 50 mg by mouth daily.   No facility-administered medications prior to visit.    Review of Systems  Constitutional: Positive for fatigue. Negative for activity change, appetite change, chills, diaphoresis, fever  and unexpected weight change.  Respiratory: Positive for cough. Negative for apnea, choking, chest tightness, shortness of breath, wheezing and stridor.   Cardiovascular: Negative.   Gastrointestinal: Negative.   Neurological: Negative for dizziness, light-headedness and headaches.      Objective    There were no vitals taken for this visit.     Assessment & Plan     1. Acute hypoxemic respiratory failure due to COVID-19 [facility]) Improving. Patient reports she is feeling about 80% better. Continue to rest and push fluids. Call if symptoms start worsening.   2. Pneumonia due to COVID-19 virus See above medical treatment plan.  3. DM type 2, uncontrolled, with neuropathy  ([place]) Patient having issues getting diabetic medications due to cost. Will refer to CCM to if they can assist with medications.  - AMB Referral to [place]   No follow-ups on file.    I discussed the assessment and treatment plan with the patient. The patient was provided an opportunity to ask questions and all were answered. The patient agreed with the plan and demonstrated an understanding of the instructions.   The patient was advised to call back or seek an in-person evaluation if the symptoms worsen or if the condition fails to improve as anticipated.  I provided 23 minutes of non-face-to-face time during this encounter.  Reynolds Bowl, PA-C, have reviewed all documentation for this visit. The documentation on 07/03/20 for the exam, diagnosis, procedures, and orders are all accurate and complete.  Rubye Beach [facility] [phone] (phone) [phone] (fax)  [place]

## 2020-06-29 ENCOUNTER — Telehealth: Payer: Self-pay

## 2020-06-29 NOTE — Chronic Care Management (AMB) (Signed)
Chronic Care Management Pharmacy Assistant   Name: Alexis Lloyd  MRN: [id number] DOB: May 03, 1961  Reason for Encounter: Patient Assistance Coordination  PCP : Mar Daring, PA-C   06/29/2020- Patient assistance forms for [employer] filled out with [employer] Patient assistance program. Called patient to see if she would like for me to place forms in the mail or would she like to come to the office to sign. Also called to inform about samples being available for patient. No answer, left message for patient to return call.   Will check to see if patient would like [facility] pharmacy to mail her a 90ds of aspirin over the counter to her?    07/02/2020- Patient returned call. Patient states she would like to sign forms at the office and aware samples are at the office of [employer]. Patient aware [name], CPP is only in the office a few days a week, Wednesday and Thursday. Patient also would like to have [facility] pharmacy mail her a 90 day supply of aspirin. Patient aware I will send request to CPP.   07/04/2020- Patient went to the office to pick up samples and sign forms. Samples were not available and form not available at this time. [name] aware, form was not sent to CPP on 07/02/2020. Form sent today, printed and waiting at the front for signature. PCP office called and informed that there are no samples at the office and spouse will come back at lunch to sign forms. I will contact patient regarding samples and apologize for the miscommunication.  Called patient twice, left message to return call.     Allergies:   Allergies  Allergen Reactions   Allegra [Fexofenadine] Nausea And Vomiting   Avelox [Moxifloxacin] Other (See Comments)    Hallucinations   Bactrim [Sulfamethoxazole-Trimethoprim] Other (See Comments)    unknown   Etodolac Nausea Only   Fish-Derived Products Nausea And Vomiting    With "seafood"   Penicillins Diarrhea and Nausea And Vomiting    Shellfish Allergy Nausea Only   Sulfa Antibiotics Other (See Comments)    unknown   Macrobid [Nitrofurantoin Monohyd Macro] Other (See Comments)    Constipation and globus hystericus.    Medications: Outpatient Encounter Medications as of 06/29/2020  Medication Sig   albuterol (VENTOLIN HFA) 108 (90 Base) MCG/ACT inhaler INHALE 2 PUFFS BY MOUTH EVERY 6 HOURS AS NEEDED   ascorbic Acid (VITAMIN C) 500 MG CPCR Take 500 mg by mouth daily.   Blood Glucose Monitoring Suppl (ONETOUCH VERIO) w/Device KIT To check blood sugar once daily   cetirizine (ZYRTEC) 10 MG tablet Take 1 tablet (10 mg total) by mouth daily.   Cyanocobalamin (VITAMIN B-12) 3000 MCG SUBL Place 1 tablet under the tongue daily at 6 (six) AM. Gummies   glipiZIDE (GLUCOTROL) 10 MG tablet TAKE 1 TABLET BY MOUTH  TWICE DAILY BEFORE A MEAL   glucose blood (ONETOUCH VERIO) test strip To check blood sugar once daily   guaiFENesin-dextromethorphan (ROBITUSSIN DM) 100-10 MG/5ML syrup Take 10 mLs by mouth every 4 (four) hours as needed for cough.   ketoconazole (NIZORAL) 2 % cream APPLY CREAM TOPICALLY TWICE DAILY TO FEET   Lancets (ONETOUCH ULTRASOFT) lancets To check BS once daily   lisinopril-hydrochlorothiazide (ZESTORETIC) 20-12.5 MG tablet TAKE 2 TABLETS BY MOUTH  DAILY   montelukast (SINGULAIR) 10 MG tablet TAKE 1 TABLET BY MOUTH  DAILY   pantoprazole (PROTONIX) 40 MG tablet Take 1 tablet (40 mg total) by mouth daily.  pioglitazone (ACTOS) 45 MG tablet Take 1 tablet (45 mg total) by mouth daily.   RYBELSUS 3 MG TABS Take 1 tablet by mouth every morning. (Patient not taking: Reported on 06/27/2020)   sitaGLIPtin (JANUVIA) 100 MG tablet Take 1 tablet (100 mg total) by mouth daily. (Patient not taking: No sig reported)   spironolactone (ALDACTONE) 25 MG tablet Take 1 tablet (25 mg total) by mouth daily.   SUMAtriptan (IMITREX) 50 MG tablet Take 1 tablet (50 mg total) by mouth every 2 (two) hours as needed for  migraine. No more than 236m in 24 hours   verapamil (CALAN) 80 MG tablet TAKE 1 TABLET BY MOUTH  TWICE DAILY   zinc gluconate 50 MG tablet Take 50 mg by mouth daily.   No facility-administered encounter medications on file as of 06/29/2020.    Current Diagnosis: Patient Active Problem List   Diagnosis Date Noted   Sepsis ([place] 06/11/2020   Pneumonia due to COVID-19 virus 06/11/2020   Class 2 obesity 06/11/2020   T2DM (type 2 diabetes mellitus) ([place] 06/11/2020   Acute hypoxemic respiratory failure due to COVID-19 ([place] 06/10/2020   Pain due to onychomycosis of toenails of both feet 10/24/2019   Benign skin lesion 10/24/2019   Recurrent UTI 01/22/2018   Recurrent vaginitis 031/05/1623  History of Helicobacter pylori infection 11/05/2017   Intrinsic eczema 11/05/2017   CLE (columnar lined esophagus)    Stomach irritation    Gastric polyp    Problems with swallowing and mastication    Heartburn    Benign neoplasm of transverse colon    Special screening for malignant neoplasms, colon    Internal hemorrhoids    Diverticulosis of large intestine without diverticulitis    Warts of foot 06/30/2017   Achilles tendon contracture 01/28/2017   Hallux rigidus of left foot 01/28/2017   Hallux rigidus, right foot 01/28/2017   Pes planus 01/28/2017   Posterior tibial tendinitis of left lower extremity 01/28/2017   Posterior tibial tendinitis of right lower extremity 01/28/2017   Asthma 09/03/2015   Foot pain 07/16/2015   DM type 2, uncontrolled, with neuropathy ([place] 07/13/2015   Abnormal finding on mammography, microcalcification 04/25/2015   Intraductal carcinoma of breast 04/24/2015   Airway hyperreactivity 11/18/2014   BMI 40.0-44.9, adult ([place] 11/18/2014   Breast cyst 11/18/2014   Diabetic neuropathy ([place] 11/18/2014   GERD (gastroesophageal reflux disease) 11/18/2014   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

## 2020-07-03 ENCOUNTER — Encounter: Payer: Self-pay | Admitting: Physician Assistant

## 2020-07-04 ENCOUNTER — Other Ambulatory Visit: Payer: Self-pay | Admitting: Physician Assistant

## 2020-07-04 MED ORDER — ASPIRIN EC 81 MG PO TBEC: 81.0000 mg | DELAYED_RELEASE_TABLET | Freq: Every day | ORAL | 4 refills | Status: DC

## 2020-07-05 ENCOUNTER — Ambulatory Visit: Payer: Self-pay | Admitting: *Deleted

## 2020-07-05 NOTE — Telephone Encounter (Signed)
Alexis Lloyd would be working on the paperwork. Not sure about that.  We can give her a month worth of samples of rybelsus if we have enough for that please.

## 2020-07-05 NOTE — Telephone Encounter (Signed)
Patient is calling to report her fasting glucose is rising without the Rybelsus. Patient states she sent her husband to the office to pick up her paperwork for assistance and sample for her to use until she can get medication assistance- there was nothing for her. She wants to know what she needs to do now. Note sent to office for review and call back  Reason for Disposition . [1] Caller has NON-URGENT medication or insulin pump question AND [2] triager unable to answer question  Answer Assessment - Initial Assessment Questions 1. BLOOD GLUCOSE: "What is your blood glucose level?"      238- fasting this morning 2. ONSET: "When did you check the blood glucose?"     This morning -fasting 3. USUAL RANGE: "What is your glucose level usually?" (e.g., usual fasting morning value, usual evening value)     It has been elevated- 184,186( last 2 days)- patient was on insulin at hospital- patient has not been using Rybelsus due to cost 4. KETONES: "Do you check for ketones (urine or blood test strips)?" If yes, ask: "What does the test show now?"      n/a 5. TYPE 1 or 2:  "Do you know what type of diabetes you have?"  (e.g., Type 1, Type 2, Gestational; doesn't know)      Type 2 6. INSULIN: "Do you take insulin?" "What type of insulin(s) do you use? What is the mode of delivery? (syringe, pen; injection or pump)?"      no 7. DIABETES PILLS: "Do you take any pills for your diabetes?" If yes, ask: "Have you missed taking any pills recently?"     glipizide,actos 8. OTHER SYMPTOMS: "Do you have any symptoms?" (e.g., fever, frequent urination, difficulty breathing, dizziness, weakness, vomiting)     no 9. PREGNANCY: "Is there any chance you are pregnant?" "When was your last menstrual period?"     n/a  Protocols used: DIABETES - HIGH BLOOD SUGAR-A-AH

## 2020-07-09 NOTE — Telephone Encounter (Signed)
Please advise 

## 2020-07-11 ENCOUNTER — Telehealth: Payer: Self-pay

## 2020-07-11 NOTE — Telephone Encounter (Signed)
Copied from [place] [id number]. Topic: General - Other >> Jul 11, 2020  9:53 AM Keene Breath wrote: Reason for CRM: Patient would like a call back regarding some paperwork that is supposed to be at the office for her before her upcoming appt.  Please call patient to confirm at [phone]

## 2020-07-12 NOTE — Telephone Encounter (Signed)
Samples and forms are at the front desk.  Pt advised.  Thanks,   -Mickel Baas

## 2020-07-16 ENCOUNTER — Telehealth: Payer: Self-pay

## 2020-07-16 DIAGNOSIS — E1122 Type 2 diabetes mellitus with diabetic chronic kidney disease: Secondary | ICD-10-CM

## 2020-07-16 DIAGNOSIS — N1831 Chronic kidney disease, stage 3a: Secondary | ICD-10-CM

## 2020-07-16 DIAGNOSIS — K219 Gastro-esophageal reflux disease without esophagitis: Secondary | ICD-10-CM

## 2020-07-16 MED ORDER — PIOGLITAZONE HCL 45 MG PO TABS
45.0000 mg | ORAL_TABLET | Freq: Every day | ORAL | 1 refills | Status: DC
Start: 2020-07-16 — End: 2020-08-01

## 2020-07-16 MED ORDER — PANTOPRAZOLE SODIUM 40 MG PO TBEC: 40.0000 mg | DELAYED_RELEASE_TABLET | Freq: Every day | ORAL | 1 refills | Status: DC

## 2020-07-16 NOTE — Telephone Encounter (Signed)
[facility] faxed refill request for the following medications:  pioglitazone (ACTOS) 45 MG tablet  pantoprazole (PROTONIX) 40 MG tablet   Please advise.

## 2020-07-19 ENCOUNTER — Telehealth: Payer: Self-pay

## 2020-07-19 NOTE — Progress Notes (Signed)
Chronic Care Management Pharmacy Assistant   Name: Alexis Lloyd  MRN: [id number] DOB: 07-16-1960  Reason for Encounter: Medication Review/Initial question for initial visit with clinical pharmacist on 07/23/2020.  Patient Questions:  1.  Have you seen any other providers since your last visit? No  2.  Any changes in your medicines or health? No     PCP : Mar Daring, PA-C  Allergies:   Allergies  Allergen Reactions  . Allegra [Fexofenadine] Nausea And Vomiting  . Avelox [Moxifloxacin] Other (See Comments)    Hallucinations  . Bactrim [Sulfamethoxazole-Trimethoprim] Other (See Comments)    unknown  . Etodolac Nausea Only  . Fish-Derived Products Nausea And Vomiting    With "seafood"  . Penicillins Diarrhea and Nausea And Vomiting  . Shellfish Allergy Nausea Only  . Sulfa Antibiotics Other (See Comments)    unknown  . Macrobid [employer] Macro] Other (See Comments)    Constipation and globus hystericus.    Medications: Outpatient Encounter Medications as of 07/19/2020  Medication Sig  . albuterol (VENTOLIN HFA) 108 (90 Base) MCG/ACT inhaler INHALE 2 PUFFS BY MOUTH EVERY 6 HOURS AS NEEDED  . ascorbic Acid (VITAMIN C) 500 MG CPCR Take 500 mg by mouth daily.  Marland Kitchen aspirin EC 81 MG tablet Take 1 tablet (81 mg total) by mouth daily. Swallow whole.  . Blood Glucose Monitoring Suppl (ONETOUCH VERIO) w/Device KIT To check blood sugar once daily  . cetirizine (ZYRTEC) 10 MG tablet Take 1 tablet (10 mg total) by mouth daily.  . Cyanocobalamin (VITAMIN B-12) 3000 MCG SUBL Place 1 tablet under the tongue daily at 6 (six) AM. Gummies  . glipiZIDE (GLUCOTROL) 10 MG tablet TAKE 1 TABLET BY MOUTH  TWICE DAILY BEFORE A MEAL  . glucose blood (ONETOUCH VERIO) test strip To check blood sugar once daily  . guaiFENesin-dextromethorphan (ROBITUSSIN DM) 100-10 MG/5ML syrup Take 10 mLs by mouth every 4 (four) hours as needed for cough.  Marland Kitchen ketoconazole (NIZORAL) 2 % cream  APPLY CREAM TOPICALLY TWICE DAILY TO FEET  . Lancets (ONETOUCH ULTRASOFT) lancets To check BS once daily  . lisinopril-hydrochlorothiazide (ZESTORETIC) 20-12.5 MG tablet TAKE 2 TABLETS BY MOUTH  DAILY  . montelukast (SINGULAIR) 10 MG tablet TAKE 1 TABLET BY MOUTH  DAILY  . pantoprazole (PROTONIX) 40 MG tablet Take 1 tablet (40 mg total) by mouth daily.  . pioglitazone (ACTOS) 45 MG tablet Take 1 tablet (45 mg total) by mouth daily.  . RYBELSUS 3 MG TABS Take 1 tablet by mouth every morning. (Patient not taking: Reported on 06/27/2020)  . sitaGLIPtin (JANUVIA) 100 MG tablet Take 1 tablet (100 mg total) by mouth daily. (Patient not taking: No sig reported)  . spironolactone (ALDACTONE) 25 MG tablet Take 1 tablet (25 mg total) by mouth daily.  . SUMAtriptan (IMITREX) 50 MG tablet Take 1 tablet (50 mg total) by mouth every 2 (two) hours as needed for migraine. No more than 269m in 24 hours  . verapamil (CALAN) 80 MG tablet TAKE 1 TABLET BY MOUTH  TWICE DAILY  . zinc gluconate 50 MG tablet Take 50 mg by mouth daily.   No facility-administered encounter medications on file as of 07/19/2020.    Current Diagnosis: Patient Active Problem List   Diagnosis Date Noted  . Sepsis ([place] 06/11/2020  . Pneumonia due to COVID-19 virus 06/11/2020  . Class 2 obesity 06/11/2020  . T2DM (type 2 diabetes mellitus) ([place] 06/11/2020  . Acute hypoxemic respiratory failure due to COVID-19 (  [place]) 06/10/2020  . Pain due to onychomycosis of toenails of both feet 10/24/2019  . Benign skin lesion 10/24/2019  . Recurrent UTI 01/22/2018  . Recurrent vaginitis 01/22/2018  . History of Helicobacter pylori infection 11/05/2017  . Intrinsic eczema 11/05/2017  . CLE (columnar lined esophagus)   . Stomach irritation   . Gastric polyp   . Problems with swallowing and mastication   . Heartburn   . Benign neoplasm of transverse colon   . Special screening for malignant neoplasms, colon   . Internal hemorrhoids   .  Diverticulosis of large intestine without diverticulitis   . Warts of foot 06/30/2017  . Achilles tendon contracture 01/28/2017  . Hallux rigidus of left foot 01/28/2017  . Hallux rigidus, right foot 01/28/2017  . Pes planus 01/28/2017  . Posterior tibial tendinitis of left lower extremity 01/28/2017  . Posterior tibial tendinitis of right lower extremity 01/28/2017  . Asthma 09/03/2015  . Foot pain 07/16/2015  . DM type 2, uncontrolled, with neuropathy ([place]) 07/13/2015  . Abnormal finding on mammography, microcalcification 04/25/2015  . Intraductal carcinoma of breast 04/24/2015  . Airway hyperreactivity 11/18/2014  . BMI 40.0-44.9, adult ([place]) 11/18/2014  . Breast cyst 11/18/2014  . Diabetic neuropathy ([place]) 11/18/2014  . GERD (gastroesophageal reflux disease) 11/18/2014  . 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 Hypercholesteremia 11/18/2014  . Hypertension 11/18/2014  . Arthritis, degenerative 11/18/2014  . Allergic rhinitis, seasonal 11/18/2014  . OSA (obstructive sleep apnea) 11/18/2014  . AC (acromioclavicular) arthritis 11/08/2014  . Personal history of breast cancer 10/04/2014  . Primary osteoarthritis of right knee 11/14/2013    Goals Addressed   None    Have you seen any other providers since your last visit? no Any changes in your medications or health? no Any side effects from any medications? no Do you have an symptoms or problems not managed by your medications? no Any concerns about your health right now? Yes  Patient states she is having right knee pian (patient states she may need a need replacement).  Patient reports she is due for her next colonoscopy. Has your provider asked that you check blood pressure, blood sugar, or follow special diet at home? no Do you get any type of exercise on a regular basis? No  Patient states she has not exercise since she had coivd, but was before. Can you think of a goal you would like to reach for your health? None ID  Do you  have any problems getting your medications? Yes  Patient states she fill out a patient assistance application and return it to the office for Rybelsus. Is there anything that you would like to discuss during the appointment?   Right knee pain, Colonoscopy , and the status of  her patient assistance application.    Please bring medications and supplements to appointment   Follow-Up:  Pharmacist Review   Bessie Harrison Pharmacist Assistant [phone]

## 2020-07-20 ENCOUNTER — Other Ambulatory Visit: Payer: Self-pay | Admitting: Physician Assistant

## 2020-07-20 DIAGNOSIS — J40 Bronchitis, not specified as acute or chronic: Secondary | ICD-10-CM

## 2020-07-23 ENCOUNTER — Ambulatory Visit (INDEPENDENT_AMBULATORY_CARE_PROVIDER_SITE_OTHER): Payer: Medicare Other

## 2020-07-23 DIAGNOSIS — E1169 Type 2 diabetes mellitus with other specified complication: Secondary | ICD-10-CM | POA: Diagnosis not present

## 2020-07-23 DIAGNOSIS — E1122 Type 2 diabetes mellitus with diabetic chronic kidney disease: Secondary | ICD-10-CM | POA: Diagnosis not present

## 2020-07-23 DIAGNOSIS — N1831 Chronic kidney disease, stage 3a: Secondary | ICD-10-CM

## 2020-07-23 DIAGNOSIS — E785 Hyperlipidemia, unspecified: Secondary | ICD-10-CM

## 2020-07-23 NOTE — Progress Notes (Signed)
Chronic Care Management Pharmacy Note  07/24/2020 Name:  Alexis Lloyd MRN:  [id number] DOB:  [birth year]  Subjective: Alexis Lloyd is an 60 y.o. year old female who is a primary patient of Alexis Lloyd, [place].  The CCM team was consulted for assistance with disease management and care coordination needs.    Engaged with patient by telephone for initial visit in response to provider referral for pharmacy case management and/or care coordination services.   Consent to Services:  The patient was given the following information about Chronic Care Management services today, agreed to services, and gave verbal consent: 1. CCM service includes personalized support from designated clinical staff supervised by the primary care provider, including individualized plan of care and coordination with other care providers 2. 24/7 contact phone numbers for assistance for urgent and routine care needs. 3. Service will only be billed when office clinical staff spend 20 minutes or more in a month to coordinate care. 4. Only one practitioner may furnish and bill the service in a calendar month. 5.The patient may stop CCM services at any time (effective at the end of the month) by phone call to the office staff. 6. The patient will be responsible for cost sharing (co-pay) of up to 20% of the service fee (after annual deductible is met). Patient agreed to services and consent obtained.  Patient Care Team: Rubye Beach as PCP - General (Family Medicine) Imagene Riches, CNM as Midwife (Obstetrics) Germaine Pomfret, [facility] (Pharmacist) [name], NP as Nurse Practitioner (Endocrinology)  Recent office visits: 06/27/20: Video visit with Fenton Malling, PA-C for acute respiratory failure. Terconazole stopped.  05/07/20: Patient presented to Fenton Malling, PA-C for follow-up. Metformin stopped due to diarrhea and incontinence. Patient started on pioglitazone. Omeprazole changed  to pantoprazole. Fluconazole started for yeast infection.   Recent consult visits: None in past 6 months.   Hospital visits: 1/2-06/15/20: Patient hospitalized for Covid infection. Patient treated with IV decadron, Zofran, and Remdesivir.   Objective:  Lab Results  Component Value Date   CREATININE 0.62 06/15/2020   BUN 21 06/15/2020   GFRNONAA >60 06/15/2020   GFRAA 91 07/07/2019   NA 137 06/15/2020   K 4.0 06/15/2020   CALCIUM 8.4 (L) 06/15/2020   CO2 26 06/15/2020    Lab Results  Component Value Date/Time   HGBA1C 7.6 (H) 06/11/2020 03:07 AM   HGBA1C 8.8 (A) 01/20/2020 09:32 AM   HGBA1C 12.3 (A) 10/14/2019 10:15 AM   HGBA1C 8.7 (H) 07/07/2019 11:31 AM   MICROALBUR 50 08/31/2017 10:06 AM   MICROALBUR 50 03/05/2016 02:04 PM    Last diabetic Eye exam:  Lab Results  Component Value Date/Time   HMDIABEYEEXA No Retinopathy 03/06/2020 12:00 AM    Last diabetic Foot exam: No results found for: HMDIABFOOTEX   Lab Results  Component Value Date   CHOL 228 (H) 09/16/2018   HDL 32 (L) 09/16/2018   LDLCALC 163 (H) 09/16/2018   TRIG 166 (H) 09/16/2018   CHOLHDL 7.1 (H) 09/16/2018    Hepatic Function Latest Ref Rng & Units 06/15/2020 06/14/2020 06/13/2020  Total Protein 6.5 - 8.1 g/dL 6.4(L) 6.8 6.5  Albumin 3.5 - 5.0 g/dL 2.8(L) 2.8(L) 2.9(L)  AST 15 - 41 U/L 11(L) 11(L) 12(L)  ALT 0 - 44 U/L 22 21 23   Alk Phosphatase 38 - 126 U/L 45 46 47  Total Bilirubin 0.3 - 1.2 mg/dL 0.6 0.5 0.5    Lab Results  Component Value Date/Time  TSH 2.190 09/16/2018 11:12 AM   TSH 2.510 08/31/2017 10:50 AM    CBC Latest Ref Rng & Units 06/10/2020 07/07/2019 09/16/2018  WBC 4.0 - 10.5 K/uL 10.7(H) 9.4 7.2  Hemoglobin 12.0 - 15.0 g/dL 11.2(L) 11.9 11.5  Hematocrit 36.0 - 46.0 % 33.9(L) 34.9 34.8  Platelets 150 - 400 K/uL 308 324 278    No results found for: VD25OH  Clinical ASCVD: No  The 10-year ASCVD risk score Mikey Bussing DC Jr., et al., 2013) is: 10.9%   Values used to calculate the score:      Age: 49 years     Sex: Female     Is Non-Hispanic African American: Yes     Diabetic: Yes     Tobacco smoker: No     Systolic Blood Pressure: 95 mmHg     Is BP treated: Yes     HDL Cholesterol: 32 mg/dL     Total Cholesterol: 228 mg/dL    Depression screen [facility] 2/9 10/28/2019 10/19/2018 08/31/2017  Decreased Interest 0 0 0  Down, Depressed, Hopeless 0 0 0  PHQ - 2 Score 0 0 0  Altered sleeping - 0 0  Tired, decreased energy - 0 1  Change in appetite - 0 -  Feeling bad or failure about yourself  - 0 0  Trouble concentrating - 0 0  Moving slowly or fidgety/restless - 0 0  Suicidal thoughts - 0 0  PHQ-9 Score - 0 1  Difficult doing work/chores - Not difficult at all Not difficult at all     Social History   Tobacco Use  Smoking Status Former Smoker  . Packs/day: 0.25  . Years: 21.00  . Pack years: 5.25  . Quit date: 06/08/1997  . Years since quitting: 23.1  Smokeless Tobacco Never Used   BP Readings from Last 3 Encounters:  06/15/20 (!) 95/57  05/07/20 121/70  02/09/20 118/81   Pulse Readings from Last 3 Encounters:  06/15/20 63  05/07/20 98  02/09/20 90   Wt Readings from Last 3 Encounters:  06/10/20 223 lb (101.2 kg)  05/07/20 221 lb (100.2 kg)  02/09/20 220 lb 3.2 oz (99.9 kg)    Assessment/Interventions: Review of patient past medical history, allergies, medications, health status, including review of consultants reports, laboratory and other test data, was performed as part of comprehensive evaluation and provision of chronic care management services.   SDOH:  (Social Determinants of Health) assessments and interventions performed: Yes SDOH Interventions   Flowsheet Row Most Recent Value  SDOH Interventions   Financial Strain Interventions Other (Comment)  [PAP]  Transportation Interventions Intervention Not Indicated      CCM Care Plan  Allergies  Allergen Reactions  . Allegra [Fexofenadine] Nausea And Vomiting  . Avelox [Moxifloxacin] Other (See  Comments)    Hallucinations  . Bactrim [Sulfamethoxazole-Trimethoprim] Other (See Comments)    unknown  . Etodolac Nausea Only  . Fish-Derived Products Nausea And Vomiting    With "seafood"  . Penicillins Diarrhea and Nausea And Vomiting  . Shellfish Allergy Nausea Only  . Sulfa Antibiotics Other (See Comments)    unknown  . Macrobid [employer] Macro] Other (See Comments)    Constipation and globus hystericus.    Medications Reviewed Today    Reviewed by Germaine Pomfret, [facility] (Pharmacist) on 07/23/20 at 1539  Med List Status: <None>  Medication Order Taking? Sig Documenting Provider Last Dose Status Informant  albuterol (VENTOLIN HFA) 108 (90 Base) MCG/ACT inhaler [id number] Yes INHALE 2 PUFFS  BY MOUTH EVERY 6 HOURS AS NEEDED Alexis Daring, PA-C Taking Active   ascorbic Acid (VITAMIN C) 500 MG CPCR [id number] Yes Take 500 mg by mouth daily. [provider] Taking Active   aspirin EC 81 MG tablet [id number] No Take 1 tablet (81 mg total) by mouth daily. Swallow whole.  Patient not taking: Reported on 07/23/2020   Alexis Daring, PA-C Not Taking Active   Blood Glucose Monitoring Suppl [facility] VERIO) w/Device Drucie Opitz [id number]  To check blood sugar once daily Fenton Malling M, [place]  Active Other  cetirizine (ZYRTEC) 10 MG tablet [id number] Yes Take 1 tablet (10 mg total) by mouth daily. Margarita Rana, MD Taking Active Other  Cholecalciferol (VITAMIN D3) 50 MCG (2000 UT) TABS [id number] Yes Take 2,000 Units by mouth. [provider] Taking Active   Cyanocobalamin (VITAMIN B-12) 3000 MCG SUBL [id number] Yes Place 1 tablet under the tongue daily at 6 (six) AM. Gummies [provider] Taking Active   glipiZIDE (GLUCOTROL) 10 MG tablet [id number] Yes TAKE 1 TABLET BY MOUTH  TWICE DAILY BEFORE A MEAL Alexis Daring, PA-C Taking Active Other  glucose blood (ONETOUCH VERIO) test strip [id number]  To check blood sugar once daily Fenton Malling M, PA-C  Active Other  guaiFENesin-dextromethorphan (ROBITUSSIN DM) 100-10 MG/5ML syrup [id number] Yes Take 10 mLs by mouth every 4 (four) hours as needed for cough. Caren Griffins, MD Taking Active   Lancets [facility] ULTRASOFT) lancets [id number]  To check BS once daily Rubye Beach  Active Other  lisinopril-hydrochlorothiazide (ZESTORETIC) 20-12.5 MG tablet [id number] Yes TAKE 2 TABLETS BY MOUTH  DAILY Alexis Daring, PA-C Taking Active Other  montelukast (SINGULAIR) 10 MG tablet [id number] Yes Take 1 tablet by mouth once daily Fenton Malling M, PA-C Taking Active   pantoprazole (PROTONIX) 40 MG tablet [id number] Yes Take 1 tablet (40 mg total) by mouth daily. Alexis Daring, PA-C Taking Active   pioglitazone (ACTOS) 45 MG tablet [id number] Yes Take 1 tablet (45 mg total) by mouth daily. Alexis Daring, PA-C Taking Active   RYBELSUS 3 MG TABS [id number] No Take 1 tablet by mouth every morning.  Patient not taking: No sig reported   Alexis Lloyd, [place] Not Taking Active   spironolactone (ALDACTONE) 25 MG tablet [id number] Yes Take 1 tablet (25 mg total) by mouth daily. Alexis Daring, PA-C Taking Active Other  SUMAtriptan (IMITREX) 50 MG tablet [id number]  Take 1 tablet (50 mg total) by mouth every 2 (two) hours as needed for migraine. No more than $RemoveB'200mg'LsANQkJI$  in 24 hours Fenton Malling M, [place]  Active Other  verapamil (CALAN) 80 MG tablet [id number] Yes TAKE 1 TABLET BY MOUTH  TWICE DAILY  Patient taking differently: Take 80 mg by mouth daily.   Alexis Lloyd, [place] Taking Active Other  zinc gluconate 50 MG tablet [id number] Yes Take 50 mg by mouth daily. [provider] Taking Active           Patient Active Problem List   Diagnosis Date Noted  . Sepsis ([place]) 06/11/2020  . Pneumonia due to COVID-19 virus 06/11/2020  . Class 2 obesity 06/11/2020  . T2DM (type 2 diabetes mellitus) ([place]) 06/11/2020  . Acute hypoxemic  respiratory failure due to COVID-19 ([place]) 06/10/2020  . Pain due to onychomycosis of toenails of both feet 10/24/2019  . Benign skin lesion 10/24/2019  . Recurrent UTI 01/22/2018  . Recurrent vaginitis 01/22/2018  . History of Helicobacter pylori infection 11/05/2017  .  Intrinsic eczema 11/05/2017  . CLE (columnar lined esophagus)   . Stomach irritation   . Gastric polyp   . Problems with swallowing and mastication   . Heartburn   . Benign neoplasm of transverse colon   . Special screening for malignant neoplasms, colon   . Internal hemorrhoids   . Diverticulosis of large intestine without diverticulitis   . Warts of foot 06/30/2017  . Achilles tendon contracture 01/28/2017  . Hallux rigidus of left foot 01/28/2017  . Hallux rigidus, right foot 01/28/2017  . Pes planus 01/28/2017  . Posterior tibial tendinitis of left lower extremity 01/28/2017  . Posterior tibial tendinitis of right lower extremity 01/28/2017  . Asthma 09/03/2015  . Foot pain 07/16/2015  . DM type 2, uncontrolled, with neuropathy ([place]) 07/13/2015  . Abnormal finding on mammography, microcalcification 04/25/2015  . Intraductal carcinoma of breast 04/24/2015  . Airway hyperreactivity 11/18/2014  . BMI 40.0-44.9, adult ([place]) 11/18/2014  . Breast cyst 11/18/2014  . Diabetic neuropathy ([place]) 11/18/2014  . GERD (gastroesophageal reflux disease) 11/18/2014  . 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 Hypercholesteremia 11/18/2014  . Hypertension 11/18/2014  . Arthritis, degenerative 11/18/2014  . Allergic rhinitis, seasonal 11/18/2014  . OSA (obstructive sleep apnea) 11/18/2014  . AC (acromioclavicular) arthritis 11/08/2014  . Personal history of breast cancer 10/04/2014  . Primary osteoarthritis of right knee 11/14/2013    Immunization History  Administered Date(s) Administered  . PFIZER(Purple Top)SARS-COV-2 Vaccination 07/31/2019, 09/13/2019  . Pneumococcal Polysaccharide-23 06/22/2009, 08/06/2015  . Tdap  08/31/2017    Conditions to be addressed/monitored:  Hypertension, Hyperlipidemia, Diabetes, GERD, Asthma and Osteoarthritis  Care Plan : General Pharmacy (Adult)  Updates made by Germaine Pomfret, RPH since 07/24/2020 12:00 AM    Problem: Hypertension, Hyperlipidemia, Diabetes, GERD, Asthma and Osteoarthritis   Priority: High    Long-Range Goal: Patient-Specific Goal   Start Date: 07/23/2020  Expected End Date: 01/21/2021  This Visit's Progress: On track  Priority: High  Note:   Current Barriers:  . Unable to independently afford treatment regimen  Pharmacist Clinical Goal(s):  Marland Kitchen Over the next 90 days, patient will verbalize ability to afford treatment regimen . maintain control of Diabetes as evidenced by A1c less than 8%  through collaboration with PharmD and provider.    Interventions: . 1:1 collaboration with Alexis Daring, PA-C regarding development and update of comprehensive plan of care as evidenced by provider attestation and co-signature . Inter-disciplinary care team collaboration (see longitudinal plan of care) . Comprehensive medication review performed; medication list updated in electronic medical record  Hypertension (BP goal <140/90) -controlled -Current treatment: . Lisinopril-HCTZ 20-12.5 mg 2 tablets daily  . Spironolactone 25 mg daily  . Verapamil 80 mg daily  -Medications previously tried: Amlodipine, spironolactone   -Current home readings: NA -Current dietary habits: Limiting fried food. Eating more vegetables  -Current exercise habits: Limited exercise due to knee pain.  -Denies hypotensive/hypertensive symptoms -Educated on Daily salt intake goal < 2300 mg; -Counseled to monitor BP at home if feeling symptomatic, document, and provide log at future appointments -Recommended to continue current medication  Hyperlipidemia: (LDL goal < 70) -uncontrolled -Current treatment: . None -Medications previously tried: Pravastatin,  Lovastatin -Patient unsure why previous statins were stopped or if she had intolerances to previously used medications.   -Educated on Cholesterol goals;  Benefits of statin for ASCVD risk reduction; Importance of limiting foods high in cholesterol; -Recommended rechecking lipid panel   -Recommended starting rosuvastatin 10 mg daily   Diabetes (A1c goal <8%) -  Managed by [name], NP  -controlled -Current medications: Marland Kitchen Glipizide 10 mg twice daily  . Pioglitazone 45 mg daily  . Rybelsus 7 mg daily (not taking)   -Medications previously tried: Januvia, Metformin (diarrhea)  -Current home glucose readings . fasting glucose: 185, 169, 159 -Denies hypoglycemic/hyperglycemic symptoms -Educated on A1c and blood sugar goals; Exercise goal of 150 minutes per week; -Counseled to check feet daily and get yearly eye exams -Assessed patient finances. Rybelsus patient assistance application submitted on 07/24/20 to [employer]  Asthma (Goal: control symptoms and prevent exacerbations) -controlled -Current treatment  . Ventolin HFA 108 mcg/act 2 puff every 6 hours as needed  . Montelukast 10 mg daily  -Medications previously tried: NA  -Pulmonary function testing: NA -Exacerbations requiring treatment in last 6 months: Yes, due to recent covid infection -Patient denies consistent use of maintenance inhaler -Frequency of rescue inhaler use: twice weekly -Counseled on Proper inhaler technique; When to use rescue inhaler -Recommended to continue current medication  GERD (Goal: prevent symptoms of heartburn and reflux) -controlled -Current treatment  . Pantoprazole 40 mg daily  -Medications previously tried: NA  -Recommended to continue current medication  Osteoarthritis (Goal: minimize arthritis pain) -uncontrolled -Current treatment  . Acetaminophen CR 650 mg (not started)  . Voltaren 1% gel  (not started)  -Wants to get a knee replacement, has been previously receiving synvisc  injections, but no longer feels she gets benefits from them.  -Counseled on risks and benefits of topical NSAID use -Counseled to avoid greater than 3000 mg of acetaminophen DAILY  -Recommended to continue current medication  Patient Goals/Self-Care Activities . Over the next 90 days, patient will:  - check glucose daily (before breakfast), document, and provide at future appointments  Follow Up Plan: Telephone follow up appointment with care management team member scheduled for: 10/22/2020 at 2:00 PM      Medication Assistance: Application for [employer]  medication assistance program. in process.  Anticipated assistance start date 08/07/2020.  See plan of care for additional detail.  Patient's preferred pharmacy is:  [facility] [address] [place] ([place]) [place] [address] Phone: [phone] Fax: [place], [address], [address] [place], [address] [place] [address] Phone: [phone] Fax: [phone]  Uses pill box? Yes Pt endorses 100% compliance  We discussed: Current pharmacy is preferred with insurance plan and patient is satisfied with pharmacy services Patient decided to: Continue current medication management strategy  Care Plan and Follow Up Patient Decision:  Patient agrees to Care Plan and Follow-up.  Plan: Telephone follow up appointment with care management team member scheduled for:  10/22/2020 at 2:00 PM  [place] [phone]

## 2020-07-24 ENCOUNTER — Telehealth: Payer: Self-pay | Admitting: Physician Assistant

## 2020-07-24 MED ORDER — ROSUVASTATIN CALCIUM 10 MG PO TABS: 10.0000 mg | ORAL_TABLET | Freq: Every day | ORAL | 3 refills | Status: DC

## 2020-07-24 NOTE — Telephone Encounter (Signed)
Alexis Lloyd,   Please reach out to Alexis Lloyd and inform her that after discussion with her PCP, we were in agreement regarding the following plan:   - Start rosuvastatin 10 mg daily for cholesterol and heart health. A new prescription was sent into her pharmacy.   - We will plan to recheck her cholesterol at her next follow-up with her PCP, which is due in March.   Thanks, [facility] [phone]

## 2020-07-24 NOTE — Telephone Encounter (Signed)
Thank you. I will let her know.

## 2020-07-24 NOTE — Telephone Encounter (Signed)
-----   Message from Germaine Pomfret, [facility] sent at 07/24/2020  9:36 AM EST ----- Regarding: CCM Pharmacist Recommendations Hello,  Patient has a history of T2DM and would qualify for a moderate intensity statin. It looks like she previous had been taking lovastatin and pravastatin, but could not remember if she had intolerances to those medications. I discussed at length the risks of cardiovascular disease in diabetes, and she was amenable to retrying a statin. What would you think about having her start rosuvastatin 10 mg daily? It also looks like it has been a while since we have rechecked her lipid panel, so I would recommend we do that as well soon.   Please let me know your thoughts so I may communicate them with the patient.  Thanks, [facility] [phone]

## 2020-07-24 NOTE — Patient Instructions (Signed)
Visit Information It was great speaking with you today!  Please let me know if you have any questions about our visit.  Goals Addressed            This Visit's Progress   . Monitor and Manage My Blood Sugar-Diabetes Type 2       Timeframe:  Long-Range Goal Priority:  High Start Date:    07/23/2020                         Expected End Date:   01/20/2021                    Follow Up Date 10/22/2020     - check blood sugar at prescribed times - check blood sugar if I feel it is too high or too low - take the blood sugar log to all doctor visits    Why is this important?    Checking your blood sugar at home helps to keep it from getting very high or very low.   Writing the results in a diary or log helps the doctor know how to care for you.   Your blood sugar log should have the time, date and the results.   Also, write down the amount of insulin or other medicine that you take.   Other information, like what you ate, exercise done and how you were feeling, will also be helpful.     Notes:        Patient Care Plan: General Pharmacy (Adult)    Problem Identified: Hypertension, Hyperlipidemia, Diabetes, GERD, Asthma and Osteoarthritis   Priority: High    Long-Range Goal: Patient-Specific Goal   Start Date: 07/23/2020  Expected End Date: 01/21/2021  This Visit's Progress: On track  Priority: High  Note:   Current Barriers:  . Unable to independently afford treatment regimen  Pharmacist Clinical Goal(s):  Marland Kitchen Over the next 90 days, patient will verbalize ability to afford treatment regimen . maintain control of Diabetes as evidenced by A1c less than 8%  through collaboration with PharmD and provider.    Interventions: . 1:1 collaboration with Mar Daring, PA-C regarding development and update of comprehensive plan of care as evidenced by provider attestation and co-signature . Inter-disciplinary care team collaboration (see longitudinal plan of  care) . Comprehensive medication review performed; medication list updated in electronic medical record  Hypertension (BP goal <140/90) -controlled -Current treatment: . Lisinopril-HCTZ 20-12.5 mg 2 tablets daily  . Spironolactone 25 mg daily  . Verapamil 80 mg daily  -Medications previously tried: Amlodipine, spironolactone   -Current home readings: NA -Current dietary habits: Limiting fried food. Eating more vegetables  -Current exercise habits: Limited exercise due to knee pain.  -Denies hypotensive/hypertensive symptoms -Educated on Daily salt intake goal < 2300 mg; -Counseled to monitor BP at home if feeling symptomatic, document, and provide log at future appointments -Recommended to continue current medication  Hyperlipidemia: (LDL goal < 70) -uncontrolled -Current treatment: . None -Medications previously tried: Pravastatin, Lovastatin -Patient unsure why previous statins were stopped or if she had intolerances to previously used medications.   -Educated on Cholesterol goals;  Benefits of statin for ASCVD risk reduction; Importance of limiting foods high in cholesterol; -Recommended rechecking lipid panel   -Recommended starting rosuvastatin 10 mg daily   Diabetes (A1c goal <8%) -Managed by [name], NP  -controlled -Current medications: Marland Kitchen Glipizide 10 mg twice daily  . Pioglitazone 45 mg daily  .  Rybelsus 7 mg daily (not taking)   -Medications previously tried: Januvia, Metformin (diarrhea)  -Current home glucose readings . fasting glucose: 185, 169, 159 -Denies hypoglycemic/hyperglycemic symptoms -Educated on A1c and blood sugar goals; Exercise goal of 150 minutes per week; -Counseled to check feet daily and get yearly eye exams -Assessed patient finances. [employer]bels[employer] patient assistance application submitted on 07/24/20 to [employer]  Asthma (Goal: control symptoms and prevent exacerbations) -controlled -Current treatment  . Ventolin HFA 108 mcg/act 2  puff every 6 hours as needed  . Montelukast 10 mg daily  -Medications previously tried: NA  -Pulmonary function testing: NA -Exacerbations requiring treatment in last 6 months: Yes, due to recent covid infection -Patient denies consistent use of maintenance inhaler -Frequency of rescue inhaler use: twice weekly -Counseled on Proper inhaler technique; When to use rescue inhaler -Recommended to continue current medication  GERD (Goal: prevent symptoms of heartburn and reflux) -controlled -Current treatment  . Pantoprazole 40 mg daily  -Medications previously tried: NA  -Recommended to continue current medication  Osteoarthritis (Goal: minimize arthritis pain) -uncontrolled -Current treatment  . Acetaminophen CR 650 mg (not started)  . Voltaren 1% gel  (not started)  -Wants to get a knee replacement, has been previously receiving synvisc injections, but no longer feels she gets benefits from them.  -Counseled on risks and benefits of topical NSAID use -Counseled to avoid greater than 3000 mg of acetaminophen DAILY  -Recommended to continue current medication  Patient Goals/Self-Care Activities . Over the next 90 days, patient will:  - check glucose daily (before breakfast), document, and provide at future appointments  Follow Up Plan: Telephone follow up appointment with care management team member scheduled for: 10/22/2020 at 2:00 PM      Patient agreed to services and verbal consent obtained.   The patient verbalized understanding of instructions, educational materials, and care plan provided today and declined offer to receive copy of patient instructions, educational materials, and care plan.   [place] [phone]

## 2020-07-24 NOTE — Telephone Encounter (Signed)
Rosuvastatin 10 sent in  We can check labs at her next follow up. She is due in March. Last one she canceled because she was recovering from Covid.

## 2020-07-25 ENCOUNTER — Telehealth: Payer: Self-pay | Admitting: Physician Assistant

## 2020-07-25 DIAGNOSIS — E1142 Type 2 diabetes mellitus with diabetic polyneuropathy: Secondary | ICD-10-CM

## 2020-07-25 MED ORDER — ONETOUCH VERIO W/DEVICE KIT: PACK | 0 refills | Status: DC

## 2020-07-25 NOTE — Telephone Encounter (Signed)
Per initial encounter: Patient mis placed her diabetes meter one touch and would like PCP to send in a new rx today. Informed patient please allow 48 to 72 hour turn around time, patient requesting Rx be expedited.    [place]), [place] - [address] Phone:  [phone]  Fax:  [phone]     The pt is seen by Fenton Malling, [facility]; will route to office for new script.

## 2020-07-25 NOTE — Telephone Encounter (Signed)
Patient mis placed her diabetes meter one touch and would like PCP to send in a new rx today. Informed patient please allow 48 to 72 hour turn around time, patient requesting Rx be expedited.    [place] ([place]), [place] - [address] Phone:  [phone]  Fax:  [phone]

## 2020-07-25 NOTE — Addendum Note (Signed)
Addended by: Althea Charon D on: 07/25/2020 10:17 AM   Modules accepted: Orders

## 2020-08-01 ENCOUNTER — Encounter: Payer: Self-pay | Admitting: Physician Assistant

## 2020-08-01 ENCOUNTER — Ambulatory Visit
Admission: RE | Admit: 2020-08-01 | Discharge: 2020-08-01 | Disposition: A | Discharge status: Home / Self Care | Payer: Medicare Other | Source: Ambulatory Visit | Attending: Physician Assistant | Admitting: Physician Assistant

## 2020-08-01 ENCOUNTER — Other Ambulatory Visit: Payer: Self-pay

## 2020-08-01 ENCOUNTER — Ambulatory Visit (INDEPENDENT_AMBULATORY_CARE_PROVIDER_SITE_OTHER): Payer: Medicare Other | Admitting: Physician Assistant

## 2020-08-01 VITALS — BP 116/69 | HR 88 | Temp 98.3°F | Wt 233.0 lb

## 2020-08-01 DIAGNOSIS — E7800 Pure hypercholesterolemia, unspecified: Secondary | ICD-10-CM

## 2020-08-01 DIAGNOSIS — Z8616 Personal history of COVID-19: Secondary | ICD-10-CM | POA: Diagnosis not present

## 2020-08-01 DIAGNOSIS — Z8701 Personal history of pneumonia (recurrent): Secondary | ICD-10-CM

## 2020-08-01 DIAGNOSIS — L0232 Furuncle of buttock: Secondary | ICD-10-CM | POA: Diagnosis not present

## 2020-08-01 DIAGNOSIS — E66812 Obesity, class 2: Secondary | ICD-10-CM

## 2020-08-01 DIAGNOSIS — R911 Solitary pulmonary nodule: Secondary | ICD-10-CM | POA: Diagnosis not present

## 2020-08-01 DIAGNOSIS — IMO0002 Reserved for concepts with insufficient information to code with codable children: Secondary | ICD-10-CM

## 2020-08-01 DIAGNOSIS — M1711 Unilateral primary osteoarthritis, right knee: Secondary | ICD-10-CM

## 2020-08-01 DIAGNOSIS — E1140 Type 2 diabetes mellitus with diabetic neuropathy, unspecified: Secondary | ICD-10-CM | POA: Diagnosis not present

## 2020-08-01 DIAGNOSIS — E1165 Type 2 diabetes mellitus with hyperglycemia: Secondary | ICD-10-CM | POA: Diagnosis not present

## 2020-08-01 DIAGNOSIS — J45909 Unspecified asthma, uncomplicated: Secondary | ICD-10-CM | POA: Diagnosis not present

## 2020-08-01 DIAGNOSIS — Z6839 Body mass index (BMI) 39.0-39.9, adult: Secondary | ICD-10-CM

## 2020-08-01 MED ORDER — ONETOUCH ULTRASOFT LANCETS MISC: 12 refills | Status: DC

## 2020-08-01 MED ORDER — DOXYCYCLINE HYCLATE 100 MG PO TABS: 100.0000 mg | ORAL_TABLET | Freq: Two times a day (BID) | ORAL | 0 refills | Status: DC

## 2020-08-01 MED ORDER — ASPIRIN EC 81 MG PO TBEC
81.0000 mg | DELAYED_RELEASE_TABLET | Freq: Every day | ORAL | 4 refills | Status: AC
Start: 2020-08-01 — End: ?

## 2020-08-01 NOTE — Progress Notes (Signed)
Established patient visit   Patient: Alexis Lloyd   DOB: January 30, 1961   60 y.o. Female  MRN: [id number] Visit Date: 08/01/2020  Today's healthcare provider: Mar Daring, PA-C   Chief Complaint  Patient presents with  . Diabetes  . Hyperlipidemia   Subjective    HPI  Follow up for Covid Pneumonia  The patient was last seen for this 6 weeks ago. Changes made at last visit include was hospitalized and treated with 5 days remdisivir and dexamethasone.  She reports excellent compliance with treatment. She feels that condition is Improved. She is not having side effects.   -----------------------------------------------------------------------------------------  Lipid/Cholesterol, Follow-up  Last lipid panel Other pertinent labs  Lab Results  Component Value Date   CHOL 228 (H) 09/16/2018   HDL 32 (L) 09/16/2018   LDLCALC 163 (H) 09/16/2018   TRIG 166 (H) 09/16/2018   CHOLHDL 7.1 (H) 09/16/2018   Lab Results  Component Value Date   ALT 22 06/15/2020   AST 11 (L) 06/15/2020   PLT 308 06/10/2020   TSH 2.190 09/16/2018     She was last seen for this 6 months ago.  Management since that visit includes none.   Symptoms: No chest pain No chest pressure/discomfort  No dyspnea No lower extremity edema  No numbness or tingling of extremity No orthopnea  No palpitations No paroxysmal nocturnal dyspnea  No speech difficulty No syncope   Current diet: in general, a "healthy" diet   Current exercise: none  The 10-year ASCVD risk score Mikey Bussing DC Brooke Bonito., et al., 2013) is: 18.5%  ---------------------------------------------------------------------------------------------------    Patient Active Problem List   Diagnosis Date Noted  . Pain due to onychomycosis of toenails of both feet 10/24/2019  . Benign skin lesion 10/24/2019  . Recurrent UTI 01/22/2018  . Recurrent vaginitis 01/22/2018  . History of Helicobacter pylori infection 11/05/2017  . Intrinsic  eczema 11/05/2017  . CLE (columnar lined esophagus)   . Stomach irritation   . Gastric polyp   . Problems with swallowing and mastication   . Heartburn   . Benign neoplasm of transverse colon   . Internal hemorrhoids   . Diverticulosis of large intestine without diverticulitis   . Warts of foot 06/30/2017  . Achilles tendon contracture 01/28/2017  . Hallux rigidus of left foot 01/28/2017  . Hallux rigidus, right foot 01/28/2017  . Pes planus 01/28/2017  . Posterior tibial tendinitis of left lower extremity 01/28/2017  . Posterior tibial tendinitis of right lower extremity 01/28/2017  . Asthma 09/03/2015  . Foot pain 07/16/2015  . DM type 2, uncontrolled, with neuropathy ([place]) 07/13/2015  . Abnormal finding on mammography, microcalcification 04/25/2015  . Intraductal carcinoma of breast 04/24/2015  . Airway hyperreactivity 11/18/2014  . Breast cyst 11/18/2014  . Diabetic neuropathy ([place]) 11/18/2014  . GERD (gastroesophageal reflux disease) 11/18/2014  . 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 Hypercholesteremia 11/18/2014  . Hypertension 11/18/2014  . Class 2 severe obesity due to excess calories with serious comorbidity and body mass index (BMI) of 39.0 to 39.9 in adult ([place]) 11/18/2014  . Arthritis, degenerative 11/18/2014  . Allergic rhinitis, seasonal 11/18/2014  . OSA (obstructive sleep apnea) 11/18/2014  . AC (acromioclavicular) arthritis 11/08/2014  . Personal history of breast cancer 10/04/2014  . Primary osteoarthritis of right knee 11/14/2013   Past Medical History:  Diagnosis Date  . Allergy   . Arthritis   . Breast cancer ([place])   . Breast cancer, left breast ([place]) 10/04/2014  .  Diabetes mellitus without complication ([place])   . GERD (gastroesophageal reflux disease)   . Hyperlipidemia   . Hypertension   . Osteoporosis   . Sleep apnea    Social History   Tobacco Use  . Smoking status: Former Smoker    Packs/day: 0.25    Years: 21.00    Pack years: 5.25    Quit  date: 06/08/1997    Years since quitting: 23.1  . Smokeless tobacco: Never Used  Vaping Use  . Vaping Use: Never used  Substance Use Topics  . Alcohol use: No  . Drug use: No   Allergies  Allergen Reactions  . Allegra [Fexofenadine] Nausea And Vomiting  . Avelox [Moxifloxacin] Other (See Comments)    Hallucinations  . Bactrim [Sulfamethoxazole-Trimethoprim] Other (See Comments)    unknown  . Etodolac Nausea Only  . Fish-Derived Products Nausea And Vomiting    With "seafood"  . Penicillins Diarrhea and Nausea And Vomiting  . Shellfish Allergy Nausea Only  . Sulfa Antibiotics Other (See Comments)    unknown  . Macrobid [employer] Macro] Other (See Comments)    Constipation and globus hystericus.     Medications: Outpatient Medications Prior to Visit  Medication Sig  . acetaminophen (TYLENOL) 650 MG CR tablet Take 650-1,300 mg by mouth every 8 (eight) hours as needed for pain.  Marland Kitchen albuterol (VENTOLIN HFA) 108 (90 Base) MCG/ACT inhaler INHALE 2 PUFFS BY MOUTH EVERY 6 HOURS AS NEEDED  . ascorbic Acid (VITAMIN C) 500 MG CPCR Take 500 mg by mouth daily.  . Blood Glucose Monitoring Suppl (ONETOUCH VERIO) w/Device KIT To check blood sugar once daily  . cetirizine (ZYRTEC) 10 MG tablet Take 1 tablet (10 mg total) by mouth daily.  . Cholecalciferol (VITAMIN D3) 50 MCG (2000 UT) TABS Take 2,000 Units by mouth.  . Cyanocobalamin (VITAMIN B-12) 3000 MCG SUBL Place 1 tablet under the tongue daily at 6 (six) AM. Gummies  . diclofenac Sodium (VOLTAREN) 1 % GEL Apply 4 g topically 4 (four) times daily as needed.  Marland Kitchen glipiZIDE (GLUCOTROL) 10 MG tablet TAKE 1 TABLET BY MOUTH  TWICE DAILY BEFORE A MEAL  . glucose blood (ONETOUCH VERIO) test strip To check blood sugar once daily  . guaiFENesin-dextromethorphan (ROBITUSSIN DM) 100-10 MG/5ML syrup Take 10 mLs by mouth every 4 (four) hours as needed for cough.  Marland Kitchen lisinopril-hydrochlorothiazide (ZESTORETIC) 20-12.5 MG tablet TAKE 2 TABLETS  BY MOUTH  DAILY  . montelukast (SINGULAIR) 10 MG tablet Take 1 tablet by mouth once daily  . pantoprazole (PROTONIX) 40 MG tablet Take 1 tablet (40 mg total) by mouth daily.  . Semaglutide (RYBELSUS) 7 MG TABS Take 7 mg by mouth daily before breakfast.  . spironolactone (ALDACTONE) 25 MG tablet Take 1 tablet (25 mg total) by mouth daily.  . SUMAtriptan (IMITREX) 50 MG tablet Take 1 tablet (50 mg total) by mouth every 2 (two) hours as needed for migraine. No more than 231m in 24 hours  . verapamil (CALAN) 80 MG tablet TAKE 1 TABLET BY MOUTH  TWICE DAILY (Patient taking differently: Take 80 mg by mouth daily.)  . zinc gluconate 50 MG tablet Take 50 mg by mouth daily.  . [DISCONTINUED] aspirin EC 81 MG tablet Take 1 tablet (81 mg total) by mouth daily. Swallow whole.  . [DISCONTINUED] Lancets (ONETOUCH ULTRASOFT) lancets To check BS once daily  . [DISCONTINUED] pioglitazone (ACTOS) 45 MG tablet Take 1 tablet (45 mg total) by mouth daily.  . rosuvastatin (CRESTOR) 10 MG  tablet Take 1 tablet (10 mg total) by mouth daily. (Patient not taking: Reported on 08/01/2020)   No facility-administered medications prior to visit.    Review of Systems  Constitutional: Positive for fatigue. Negative for activity change, appetite change, chills, diaphoresis, fever and unexpected weight change.  HENT: Negative.  Negative for postnasal drip and rhinorrhea.   Respiratory: Negative.   Cardiovascular: Positive for leg swelling. Negative for chest pain and palpitations.  Gastrointestinal: Negative.   Musculoskeletal: Positive for arthralgias (Right knee pain).  Skin: Positive for wound. Negative for color change, pallor and rash.  Neurological: Negative for dizziness, light-headedness and headaches.        Objective    BP 116/69 (BP Location: Right Arm, Patient Position: Sitting, Cuff Size: Large)   Pulse 88   Temp 98.3 F (36.8 C) (Oral)   Wt 233 lb (105.7 kg)   BMI 39.99 kg/m    Physical  Exam Vitals reviewed.  Constitutional:      General: She is not in acute distress.    Appearance: Normal appearance. She is well-developed and well-nourished. She is obese. She is not ill-appearing or diaphoretic.  Neck:     Thyroid: No thyromegaly.     Vascular: No JVD.     Trachea: No tracheal deviation.  Cardiovascular:     Rate and Rhythm: Normal rate and regular rhythm.     Pulses: Normal pulses.     Heart sounds: Normal heart sounds. No murmur heard. No friction rub. No gallop.   Pulmonary:     Effort: Pulmonary effort is normal. No respiratory distress.     Breath sounds: Normal breath sounds. No wheezing or rales.  Abdominal:     General: Abdomen is flat. Bowel sounds are normal.     Palpations: Abdomen is soft.     Tenderness: There is no abdominal tenderness.  Musculoskeletal:     Cervical back: Normal range of motion and neck supple. No tenderness.     Right lower leg: No edema.     Left lower leg: No edema.  Skin:    General: Skin is warm and dry.     Capillary Refill: Capillary refill takes less than 2 seconds.  Neurological:     Mental Status: She is alert.      No results found for any visits on 08/01/20.  Assessment & Plan     1. History of pneumonia Much improved. Will get CXR as below to f/u and check for clearance.  - DG Chest 2 View; Future  2. History of COVID-19 See above medical treatment plan. - DG Chest 2 View; Future  3. Class 2 severe obesity due to excess calories with serious comorbidity and body mass index (BMI) of 39.0 to 39.9 in adult [facility]) Counseled patient on healthy lifestyle modifications including dieting and exercise.  Will check labs as below and f/u pending results. - CBC w/Diff/Platelet - Comprehensive Metabolic Panel (CMET) - Lipid Panel With LDL/HDL Ratio - HgB A1c  4. DM type 2, uncontrolled, with neuropathy ([place]) Stable. Sugars have been improving with Rybelsus. Will stop pioglitazone (having edema). Continue Glipizide  $RemoveBe'10mg'[id number]$  BID. Has appt with Endocrine upcoming. Will check labs as below and f/u pending results. - CBC w/Diff/Platelet - Comprehensive Metabolic Panel (CMET) - Lipid Panel With LDL/HDL Ratio - HgB A1c - Lancets (ONETOUCH ULTRASOFT) lancets; To check BS once daily  Dispense: 100 each; Refill: 12  5. Hypercholesteremia Wanting to start Crestor with her and she is agreeable, but wants to  check cholesterol levels first. Ordered below.  - CBC w/Diff/Platelet - Comprehensive Metabolic Panel (CMET) - Lipid Panel With LDL/HDL Ratio - HgB A1c  6. Primary osteoarthritis of right knee Previously with [facility] in [employer] but provider retired. Has had steroid injections. Referral placed to establish care locally.  - Ambulatory referral to Orthopedic Surgery  7. Boil of buttock Noted on right gluteal cleft area. Not healing. Will use Doxycycline as below. Call if still not improving.  - doxycycline (VIBRA-TABS) 100 MG tablet; Take 1 tablet (100 mg total) by mouth 2 (two) times daily.  Dispense: 20 tablet; Refill: 0   No follow-ups on file.      Reynolds Bowl, PA-C, have reviewed all documentation for this visit. The documentation on 08/01/20 for the exam, diagnosis, procedures, and orders are all accurate and complete.   [place]  [facility] [phone] (phone) [phone] (fax)  [place]

## 2020-08-02 LAB — COMPREHENSIVE METABOLIC PANEL
ALT: 13 IU/L (ref 0–32)
AST: 8 IU/L (ref 0–40)
Albumin/Globulin Ratio: 1.5 (ref 1.2–2.2)
Albumin: 4.3 g/dL (ref 3.8–4.8)
Alkaline Phosphatase: 64 IU/L (ref 44–121)
BUN/Creatinine Ratio: 26 (ref 12–28)
BUN: 24 mg/dL (ref 8–27)
Bilirubin Total: 0.3 mg/dL (ref 0.0–1.2)
CO2: 23 mmol/L (ref 20–29)
Calcium: 9.7 mg/dL (ref 8.7–10.3)
Chloride: 102 mmol/L (ref 96–106)
Creatinine, Ser: 0.93 mg/dL (ref 0.57–1.00)
GFR calc Af Amer: 77 mL/min/{1.73_m2} (ref >59)
GFR calc non Af Amer: 67 mL/min/{1.73_m2} (ref >59)
Globulin, Total: 2.8 g/dL (ref 1.5–4.5)
Glucose: 65 mg/dL (ref 65–99)
Potassium: 4.6 mmol/L (ref 3.5–5.2)
Sodium: 142 mmol/L (ref 134–144)
Total Protein: 7.1 g/dL (ref 6.0–8.5)

## 2020-08-02 LAB — CBC WITH DIFFERENTIAL/PLATELET
Basophils Absolute: 0 10*3/uL (ref 0.0–0.2)
Basos: 1 %
EOS (ABSOLUTE): 0.1 10*3/uL (ref 0.0–0.4)
Eos: 1 %
Hematocrit: 33.2 % — ABNORMAL LOW (ref 34.0–46.6)
Hemoglobin: 11 g/dL — ABNORMAL LOW (ref 11.1–15.9)
Immature Grans (Abs): 0 10*3/uL (ref 0.0–0.1)
Immature Granulocytes: 0 %
Lymphocytes Absolute: 2.4 10*3/uL (ref 0.7–3.1)
Lymphs: 37 %
MCH: 28.6 pg (ref 26.6–33.0)
MCHC: 33.1 g/dL (ref 31.5–35.7)
MCV: 86 fL (ref 79–97)
Monocytes Absolute: 0.5 10*3/uL (ref 0.1–0.9)
Monocytes: 7 %
Neutrophils Absolute: 3.5 10*3/uL (ref 1.4–7.0)
Neutrophils: 54 %
Platelets: 243 10*3/uL (ref 150–450)
RBC: 3.85 x10E6/uL (ref 3.77–5.28)
RDW: 13.5 % (ref 11.7–15.4)
WBC: 6.5 10*3/uL (ref 3.4–10.8)

## 2020-08-02 LAB — LIPID PANEL WITH LDL/HDL RATIO
Cholesterol, Total: 233 mg/dL — ABNORMAL HIGH (ref 100–199)
HDL: 38 mg/dL — ABNORMAL LOW (ref >39)
LDL Chol Calc (NIH): 178 mg/dL — ABNORMAL HIGH (ref 0–99)
LDL/HDL Ratio: 4.7 ratio — ABNORMAL HIGH (ref 0.0–3.2)
Triglycerides: 95 mg/dL (ref 0–149)
VLDL Cholesterol Cal: 17 mg/dL (ref 5–40)

## 2020-08-02 LAB — HEMOGLOBIN A1C
Est. average glucose Bld gHb Est-mCnc: 163 mg/dL
Hgb A1c MFr Bld: 7.3 % — ABNORMAL HIGH (ref 4.8–5.6)

## 2020-08-03 ENCOUNTER — Telehealth: Payer: Self-pay

## 2020-08-03 NOTE — Progress Notes (Signed)
Chronic Care Management Pharmacy Assistant   Name: Alexis Lloyd  MRN: [id number] DOB: January 09, 1961  Reason for Encounter: Medication Review  PCP : Mar Daring, PA-C  Allergies:   Allergies  Allergen Reactions  . Allegra [Fexofenadine] Nausea And Vomiting  . Avelox [Moxifloxacin] Other (See Comments)    Hallucinations  . Bactrim [Sulfamethoxazole-Trimethoprim] Other (See Comments)    unknown  . Etodolac Nausea Only  . Fish-Derived Products Nausea And Vomiting    With "seafood"  . Penicillins Diarrhea and Nausea And Vomiting  . Shellfish Allergy Nausea Only  . Sulfa Antibiotics Other (See Comments)    unknown  . Macrobid [employer] Macro] Other (See Comments)    Constipation and globus hystericus.    Medications: Outpatient Encounter Medications as of 08/03/2020  Medication Sig Note  . acetaminophen (TYLENOL) 650 MG CR tablet Take 650-1,300 mg by mouth every 8 (eight) hours as needed for pain.   Marland Kitchen albuterol (VENTOLIN HFA) 108 (90 Base) MCG/ACT inhaler INHALE 2 PUFFS BY MOUTH EVERY 6 HOURS AS NEEDED   . ascorbic Acid (VITAMIN C) 500 MG CPCR Take 500 mg by mouth daily.   Marland Kitchen aspirin EC 81 MG tablet Take 1 tablet (81 mg total) by mouth daily. Swallow whole.   . Blood Glucose Monitoring Suppl (ONETOUCH VERIO) w/Device KIT To check blood sugar once daily   . cetirizine (ZYRTEC) 10 MG tablet Take 1 tablet (10 mg total) by mouth daily.   . Cholecalciferol (VITAMIN D3) 50 MCG (2000 UT) TABS Take 2,000 Units by mouth.   . Cyanocobalamin (VITAMIN B-12) 3000 MCG SUBL Place 1 tablet under the tongue daily at 6 (six) AM. Gummies   . diclofenac Sodium (VOLTAREN) 1 % GEL Apply 4 g topically 4 (four) times daily as needed.   . doxycycline (VIBRA-TABS) 100 MG tablet Take 1 tablet (100 mg total) by mouth 2 (two) times daily.   Marland Kitchen glipiZIDE (GLUCOTROL) 10 MG tablet TAKE 1 TABLET BY MOUTH  TWICE DAILY BEFORE A MEAL   . glucose blood (ONETOUCH VERIO) test strip To check  blood sugar once daily   . guaiFENesin-dextromethorphan (ROBITUSSIN DM) 100-10 MG/5ML syrup Take 10 mLs by mouth every 4 (four) hours as needed for cough.   . Lancets (ONETOUCH ULTRASOFT) lancets To check BS once daily   . lisinopril-hydrochlorothiazide (ZESTORETIC) 20-12.5 MG tablet TAKE 2 TABLETS BY MOUTH  DAILY   . montelukast (SINGULAIR) 10 MG tablet Take 1 tablet by mouth once daily   . pantoprazole (PROTONIX) 40 MG tablet Take 1 tablet (40 mg total) by mouth daily.   . rosuvastatin (CRESTOR) 10 MG tablet Take 1 tablet (10 mg total) by mouth daily. (Patient not taking: Reported on 08/01/2020)   . Semaglutide (RYBELSUS) 7 MG TABS Take 7 mg by mouth daily before breakfast. 07/24/2020: Prescribed by [name], NP   . spironolactone (ALDACTONE) 25 MG tablet Take 1 tablet (25 mg total) by mouth daily.   . SUMAtriptan (IMITREX) 50 MG tablet Take 1 tablet (50 mg total) by mouth every 2 (two) hours as needed for migraine. No more than 279m in 24 hours   . verapamil (CALAN) 80 MG tablet TAKE 1 TABLET BY MOUTH  TWICE DAILY (Patient taking differently: Take 80 mg by mouth daily.)   . zinc gluconate 50 MG tablet Take 50 mg by mouth daily.    No facility-administered encounter medications on file as of 08/03/2020.    Current Diagnosis: Patient Active Problem List   Diagnosis Date  Noted  . Pain due to onychomycosis of toenails of both feet 10/24/2019  . Benign skin lesion 10/24/2019  . Recurrent UTI 01/22/2018  . Recurrent vaginitis 01/22/2018  . History of Helicobacter pylori infection 11/05/2017  . Intrinsic eczema 11/05/2017  . CLE (columnar lined esophagus)   . Stomach irritation   . Gastric polyp   . Problems with swallowing and mastication   . Heartburn   . Benign neoplasm of transverse colon   . Internal hemorrhoids   . Diverticulosis of large intestine without diverticulitis   . Warts of foot 06/30/2017  . Achilles tendon contracture 01/28/2017  . Hallux rigidus of left foot  01/28/2017  . Hallux rigidus, right foot 01/28/2017  . Pes planus 01/28/2017  . Posterior tibial tendinitis of left lower extremity 01/28/2017  . Posterior tibial tendinitis of right lower extremity 01/28/2017  . Asthma 09/03/2015  . Foot pain 07/16/2015  . DM type 2, uncontrolled, with neuropathy ([place]) 07/13/2015  . Abnormal finding on mammography, microcalcification 04/25/2015  . Intraductal carcinoma of breast 04/24/2015  . Airway hyperreactivity 11/18/2014  . Breast cyst 11/18/2014  . Diabetic neuropathy ([place]) 11/18/2014  . GERD (gastroesophageal reflux disease) 11/18/2014  . 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 Hypercholesteremia 11/18/2014  . Hypertension 11/18/2014  . Class 2 severe obesity due to excess calories with serious comorbidity and body mass index (BMI) of 39.0 to 39.9 in adult ([place]) 11/18/2014  . Arthritis, degenerative 11/18/2014  . Allergic rhinitis, seasonal 11/18/2014  . OSA (obstructive sleep apnea) 11/18/2014  . AC (acromioclavicular) arthritis 11/08/2014  . Personal history of breast cancer 10/04/2014  . Primary osteoarthritis of right knee 11/14/2013    Goals Addressed   None    Reviewed chart and adherence measures. Per insurance data patient is 100 % adherent to glipizide and 100% adherent to lisinopril-HTCZ .   Follow-Up:  Pharmacist Review   [place] Pharmacist Assistant [phone]

## 2020-08-08 ENCOUNTER — Ambulatory Visit: Payer: Medicare Other

## 2020-08-08 DIAGNOSIS — R921 Mammographic calcification found on diagnostic imaging of breast: Secondary | ICD-10-CM | POA: Diagnosis not present

## 2020-08-08 DIAGNOSIS — Z86000 Personal history of in-situ neoplasm of breast: Secondary | ICD-10-CM | POA: Diagnosis not present

## 2020-08-08 DIAGNOSIS — D0512 Intraductal carcinoma in situ of left breast: Secondary | ICD-10-CM | POA: Diagnosis not present

## 2020-08-13 DIAGNOSIS — E782 Mixed hyperlipidemia: Secondary | ICD-10-CM | POA: Diagnosis not present

## 2020-08-13 DIAGNOSIS — E1142 Type 2 diabetes mellitus with diabetic polyneuropathy: Secondary | ICD-10-CM | POA: Diagnosis not present

## 2020-08-13 DIAGNOSIS — E1165 Type 2 diabetes mellitus with hyperglycemia: Secondary | ICD-10-CM | POA: Diagnosis not present

## 2020-08-13 DIAGNOSIS — I10 Essential (primary) hypertension: Secondary | ICD-10-CM | POA: Diagnosis not present

## 2020-08-15 ENCOUNTER — Telehealth: Payer: Self-pay

## 2020-08-15 ENCOUNTER — Other Ambulatory Visit: Payer: Self-pay

## 2020-08-15 ENCOUNTER — Ambulatory Visit (INDEPENDENT_AMBULATORY_CARE_PROVIDER_SITE_OTHER): Payer: Medicare Other

## 2020-08-15 DIAGNOSIS — Z23 Encounter for immunization: Secondary | ICD-10-CM

## 2020-08-16 ENCOUNTER — Other Ambulatory Visit: Payer: Self-pay | Admitting: Physician Assistant

## 2020-08-16 DIAGNOSIS — B379 Candidiasis, unspecified: Secondary | ICD-10-CM

## 2020-08-16 NOTE — Telephone Encounter (Signed)
[facility] faxed refill request for the following medications:  1. rosuvastatin (CRESTOR) 10 MG tablet Last Rx 07/2020 was sent to local pharmacy not mail order 2. verapamil (CALAN) 80 MG tablet      90 day supply  LOV: 08/01/20 Please advise. Thanks TNP

## 2020-08-16 NOTE — Telephone Encounter (Signed)
Requested medication (s) are due for refill today: No  Requested medication (s) are on the active medication list: No  Last refill:  05/07/20  Future visit scheduled: No  Notes to clinic:  See request.    Requested Prescriptions  Pending Prescriptions Disp Refills   fluconazole (DIFLUCAN) 150 MG tablet [Pharmacy Med Name: FLUCONAZOLE  150MG   TAB] 2 tablet 0    Sig: TAKE 1 TABLET BY MOUTH ONCE FOR 1 DOSE. REPEAT IN 48 TO 72 HOURS IF NEEDED      Off-Protocol Failed - 08/16/2020 11:14 AM      Failed - Medication not assigned to a protocol, review manually.      Passed - Valid encounter within last 12 months    Recent Outpatient Visits           2 weeks ago History of pneumonia   [place], [place], [place]   1 month ago Acute hypoxemic respiratory failure due to COVID-19 [facility])   [place], [place], PA-C   3 months ago Type 2 diabetes mellitus with stage 3a chronic kidney disease, without long-term current use of insulin [facility]   [place], [place], [place]   6 months ago Bad odor of urine   [place], PA-C   6 months ago DM type 2, uncontrolled, with neuropathy [facility])   [facility], [place], [place]

## 2020-08-20 DIAGNOSIS — M1711 Unilateral primary osteoarthritis, right knee: Secondary | ICD-10-CM | POA: Diagnosis not present

## 2020-08-22 ENCOUNTER — Other Ambulatory Visit: Payer: Self-pay | Admitting: Physician Assistant

## 2020-08-22 DIAGNOSIS — IMO0002 Reserved for concepts with insufficient information to code with codable children: Secondary | ICD-10-CM

## 2020-08-22 DIAGNOSIS — E1140 Type 2 diabetes mellitus with diabetic neuropathy, unspecified: Secondary | ICD-10-CM

## 2020-08-22 NOTE — Telephone Encounter (Signed)
Requested Prescriptions  Pending Prescriptions Disp Refills  . glipiZIDE (GLUCOTROL) 10 MG tablet [Pharmacy Med Name: glipiZIDE 10 MG Oral Tablet] 180 tablet 0    Sig: TAKE 1 TABLET BY MOUTH  TWICE DAILY BEFORE A MEAL     Endocrinology:  Diabetes - Sulfonylureas Passed - 08/22/2020 11:52 PM      Passed - HBA1C is between 0 and 7.9 and within 180 days    Hgb A1c MFr Bld  Date Value Ref Range Status  08/01/2020 7.3 (H) 4.8 - 5.6 % Final    Comment:             Prediabetes: 5.7 - 6.4          Diabetes: >6.4          Glycemic control for adults with diabetes: <7.0          Passed - Valid encounter within last 6 months    Recent Outpatient Visits          3 weeks ago History of pneumonia   [facility] [place], [place], PA-C   1 month ago Acute hypoxemic respiratory failure due to COVID-19 [facility])   [place], [place], PA-C   3 months ago Type 2 diabetes mellitus with stage 3a chronic kidney disease, without long-term current use of insulin [facility])   [place], PA-C   6 months ago Bad odor of urine   [place], PA-C   7 months ago DM type 2, uncontrolled, with neuropathy [facility])   [place], [place], [place]

## 2020-08-29 ENCOUNTER — Telehealth: Payer: Self-pay

## 2020-08-29 NOTE — Telephone Encounter (Signed)
Copied from [place] [id number]. Topic: General - Other >> Aug 29, 2020  9:42 AM Celene Kras wrote: Reason for CRM: Arby Barrette, from [facility], calling and is requesting to verify if the fax had been received for pt on 08/21/20. Arby Barrette states that she will be sending over another fax today. Please advise.

## 2020-09-05 ENCOUNTER — Telehealth: Payer: Self-pay

## 2020-09-05 NOTE — Telephone Encounter (Signed)
Copied from [place] [phone]. Topic: General - Other >> Aug 29, 2020  9:42 AM Celene Kras wrote: Reason for CRM: Arby Barrette, from [facility], calling and is requesting to verify if the fax had been received for pt on 08/21/20. Arby Barrette states that she will be sending over another fax today. Please advise. >> Sep 05, 2020  2:29 PM Keene Breath wrote: [facility] is calling again to check the status of a from that was faxed 2x to the office regarding patient.  She still has not gotten a response.  Please call to confirm at [phone], ext [phone]

## 2020-09-07 NOTE — Telephone Encounter (Signed)
Alexis Lloyd aware that we have pre op form and patient is scheduled.

## 2020-09-07 NOTE — Telephone Encounter (Signed)
lmtcb

## 2020-09-14 ENCOUNTER — Ambulatory Visit: Payer: Medicare Other | Admitting: Family Medicine

## 2020-09-16 ENCOUNTER — Other Ambulatory Visit: Payer: Self-pay | Admitting: Physician Assistant

## 2020-09-16 DIAGNOSIS — I10 Essential (primary) hypertension: Secondary | ICD-10-CM

## 2020-09-16 NOTE — Telephone Encounter (Signed)
Requested Prescriptions  Pending Prescriptions Disp Refills  . verapamil (CALAN) 80 MG tablet [Pharmacy Med Name: VERAPAMIL  80MG   TAB] 180 tablet 3    Sig: TAKE 1 TABLET BY MOUTH  TWICE DAILY     Cardiovascular:  Calcium Channel Blockers Passed - 09/16/2020  5:42 AM      Passed - Last BP in normal range    BP Readings from Last 1 Encounters:  08/01/20 116/69         Passed - Valid encounter within last 6 months    Recent Outpatient Visits          1 month ago History of pneumonia   [facility] [place], [place], [place]   2 months ago Acute hypoxemic respiratory failure due to COVID-19 [facility])   [place], [place], PA-C   4 months ago Type 2 diabetes mellitus with stage 3a chronic kidney disease, without long-term current use of insulin [facility])   [place], [place], [place]   7 months ago Bad odor of urine   [place], PA-C   8 months ago DM type 2, uncontrolled, with neuropathy [facility])   [place], [place], [place]      Future Appointments            In 3 days [place], Laurita Quint, [place], [place]

## 2020-09-19 ENCOUNTER — Other Ambulatory Visit: Payer: Self-pay

## 2020-09-19 ENCOUNTER — Encounter: Payer: Self-pay | Admitting: Family Medicine

## 2020-09-19 ENCOUNTER — Ambulatory Visit (INDEPENDENT_AMBULATORY_CARE_PROVIDER_SITE_OTHER): Payer: Medicare Other | Admitting: Family Medicine

## 2020-09-19 VITALS — BP 121/92 | HR 90 | Temp 98.9°F | Resp 16 | Ht 58.5 in | Wt 230.0 lb

## 2020-09-19 DIAGNOSIS — Z01818 Encounter for other preprocedural examination: Secondary | ICD-10-CM

## 2020-09-19 LAB — POCT URINALYSIS DIP (MANUAL ENTRY)
Bilirubin, UA: NEGATIVE
Blood, UA: NEGATIVE
Glucose, UA: NEGATIVE mg/dL
Ketones, POC UA: NEGATIVE mg/dL
Leukocytes, UA: NEGATIVE
Nitrite, UA: NEGATIVE
Protein Ur, POC: NEGATIVE mg/dL
Spec Grav, UA: 1.02 (ref 1.010–1.025)
Urobilinogen, UA: 0.2 E.U./dL
pH, UA: 6.5 (ref 5.0–8.0)

## 2020-09-19 NOTE — Progress Notes (Signed)
Established patient visit   Patient: Alexis Lloyd   DOB: June 05, 1961   60 y.o. Female  MRN: [id number] Visit Date: 09/19/2020  Today's healthcare provider: Laurita Quint Haeley [place]ham, FNP   Chief Complaint  Patient presents with  . Pre-op Exam   Subjective    HPI  Patient presents today for a surgical clearance. She is having a right total knee replacement on 10/11/2020.  Is eager to get her surgery done Discussed post operative care   Had Labs done on 2/23 Lab Results  Component Value Date   WBC 6.5 08/01/2020   HGB 11.0 (L) 08/01/2020   HCT 33.2 (L) 08/01/2020   MCV 86 08/01/2020   PLT 243 08/01/2020   Lab Results  Component Value Date   NA 142 08/01/2020   K 4.6 08/01/2020   CO2 23 08/01/2020   GLUCOSE 65 08/01/2020   BUN 24 08/01/2020   CREATININE 0.93 08/01/2020   CALCIUM 9.7 08/01/2020   GFRNONAA 67 08/01/2020   GFRAA 77 08/01/2020   Lab Results  Component Value Date   HGBA1C 7.3 (H) 08/01/2020    Albumin: 4.3  BMI: 47.25  On daily aspirin.  Medications: Outpatient Medications Prior to Visit  Medication Sig  . acetaminophen (TYLENOL) 650 MG CR tablet Take 650-1,300 mg by mouth every 8 (eight) hours as needed for pain.  Marland Kitchen albuterol (VENTOLIN HFA) 108 (90 Base) MCG/ACT inhaler INHALE 2 PUFFS BY MOUTH EVERY 6 HOURS AS NEEDED  . ascorbic Acid (VITAMIN C) 500 MG CPCR Take 500 mg by mouth daily.  Marland Kitchen aspirin EC 81 MG tablet Take 1 tablet (81 mg total) by mouth daily. Swallow whole.  . Blood Glucose Monitoring Suppl (ONETOUCH VERIO) w/Device KIT To check blood sugar once daily  . cetirizine (ZYRTEC) 10 MG tablet Take 1 tablet (10 mg total) by mouth daily.  . Cholecalciferol (VITAMIN D3) 50 MCG (2000 UT) TABS Take 2,000 Units by mouth.  . Cyanocobalamin (VITAMIN B-12) 3000 MCG SUBL Place 1 tablet under the tongue daily at 6 (six) AM. Gummies  . diclofenac Sodium (VOLTAREN) 1 % GEL Apply 4 g topically 4 (four) times daily as needed.  Marland Kitchen glipiZIDE (GLUCOTROL) 10 MG  tablet TAKE 1 TABLET BY MOUTH  TWICE DAILY BEFORE A MEAL  . glucose blood (ONETOUCH VERIO) test strip To check blood sugar once daily  . lisinopril-hydrochlorothiazide (ZESTORETIC) 20-12.5 MG tablet TAKE 2 TABLETS BY MOUTH  DAILY  . montelukast (SINGULAIR) 10 MG tablet Take 1 tablet by mouth once daily  . pantoprazole (PROTONIX) 40 MG tablet Take 1 tablet (40 mg total) by mouth daily.  . Semaglutide (RYBELSUS) 7 MG TABS Take 7 mg by mouth daily before breakfast.  . spironolactone (ALDACTONE) 25 MG tablet Take 1 tablet (25 mg total) by mouth daily.  . SUMAtriptan (IMITREX) 50 MG tablet Take 1 tablet (50 mg total) by mouth every 2 (two) hours as needed for migraine. No more than 252m in 24 hours  . verapamil (CALAN) 80 MG tablet TAKE 1 TABLET BY MOUTH  TWICE DAILY  . zinc gluconate 50 MG tablet Take 50 mg by mouth daily.  .Marland Kitchendoxycycline (VIBRA-TABS) 100 MG tablet Take 1 tablet (100 mg total) by mouth 2 (two) times daily.  .Marland KitchenguaiFENesin-dextromethorphan (ROBITUSSIN DM) 100-10 MG/5ML syrup Take 10 mLs by mouth every 4 (four) hours as needed for cough.  . Lancets (ONETOUCH ULTRASOFT) lancets To check BS once daily  . rosuvastatin (CRESTOR) 10 MG tablet Take 1 tablet (  10 mg total) by mouth daily. (Patient not taking: No sig reported)   No facility-administered medications prior to visit.    Review of Systems  Constitutional: Negative.   Cardiovascular: Negative.   Musculoskeletal: Positive for arthralgias.      Objective    BP (!) 121/92   Pulse 90   Temp 98.9 F (37.2 C)   Resp 16   Ht 4' 10.5" (1.486 m)   Wt 230 lb (104.3 kg)   BMI 47.25 kg/m     Physical Exam Constitutional:      General: She is not in acute distress.    Appearance: Normal appearance. She is not ill-appearing.  HENT:     Head: Normocephalic.  Cardiovascular:     Rate and Rhythm: Normal rate and regular rhythm.     Pulses: Normal pulses.     Heart sounds: Normal heart sounds. No murmur heard. No friction  rub. No gallop.   Pulmonary:     Effort: Pulmonary effort is normal. No respiratory distress.     Breath sounds: Normal breath sounds. No stridor. No wheezing, rhonchi or rales.  Abdominal:     General: Bowel sounds are normal.     Palpations: Abdomen is soft.     Tenderness: There is no abdominal tenderness.  Musculoskeletal:     Right knee: Tenderness present.     Left knee: Normal.     Right lower leg: No edema.     Left lower leg: No edema.  Skin:    General: Skin is warm and dry.  Neurological:     Mental Status: She is alert and oriented to person, place, and time.  Psychiatric:        Mood and Affect: Mood normal.        Behavior: Behavior normal.       Results for orders placed or performed in visit on 09/19/20  POCT urinalysis dipstick  Result Value Ref Range   Color, UA yellow yellow   Clarity, UA clear clear   Glucose, UA negative negative mg/dL   Bilirubin, UA negative negative   Ketones, POC UA negative negative mg/dL   Spec Grav, UA 1.020 1.010 - 1.025   Blood, UA negative negative   pH, UA 6.5 5.0 - 8.0   Protein Ur, POC negative negative mg/dL   Urobilinogen, UA 0.2 0.2 or 1.0 E.U./dL   Nitrite, UA Negative Negative   Leukocytes, UA Negative Negative    Assessment & Plan     Problem List Items Addressed This Visit   None   Visit Diagnoses    Pre-op examination    -  Primary   Relevant Orders   EKG 12-Lead (Completed)   POCT urinalysis dipstick (Completed)     Plan Compared to prior EKG: No Changes: NSR, rate 88, pr 18, QT 35, QRS 9 Stop Aspirin 7 days before surgery and restart when able after Surgical clearance form signed and faxed to [facility]   Return in about 6 months (around 03/21/2021).      [place], [place] [phone] (phone) [phone] (fax)  [place]

## 2020-09-19 NOTE — Patient Instructions (Signed)
Health Maintenance, Female Adopting a healthy lifestyle and getting preventive care are important in promoting health and wellness. Ask your health care provider about:  The right schedule for you to have regular tests and exams.  Things you can do on your own to prevent diseases and keep yourself healthy. What should I know about diet, weight, and exercise? Eat a healthy diet  Eat a diet that includes plenty of vegetables, fruits, low-fat dairy products, and lean protein.  Do not eat a lot of foods that are high in solid fats, added sugars, or sodium.   Maintain a healthy weight Body mass index (BMI) is used to identify weight problems. It estimates body fat based on height and weight. Your health care provider can help determine your BMI and help you achieve or maintain a healthy weight. Get regular exercise Get regular exercise. This is one of the most important things you can do for your health. Most adults should:  Exercise for at least 150 minutes each week. The exercise should increase your heart rate and make you sweat (moderate-intensity exercise).  Do strengthening exercises at least twice a week. This is in addition to the moderate-intensity exercise.  Spend less time sitting. Even light physical activity can be beneficial. Watch cholesterol and blood lipids Have your blood tested for lipids and cholesterol at 62 years of age, then have this test every 5 years. Have your cholesterol levels checked more often if:  Your lipid or cholesterol levels are high.  You are older than 62 years of age.  You are at high risk for heart disease. What should I know about cancer screening? Depending on your health history and family history, you may need to have cancer screening at various ages. This may include screening for:  Breast cancer.  Cervical cancer.  Colorectal cancer.  Skin cancer.  Lung cancer. What should I know about heart disease, diabetes, and high blood  pressure? Blood pressure and heart disease  High blood pressure causes heart disease and increases the risk of stroke. This is more likely to develop in people who have high blood pressure readings, are of African descent, or are overweight.  Have your blood pressure checked: ? Every 3-5 years if you are 18-39 years of age. ? Every year if you are 40 years old or older. Diabetes Have regular diabetes screenings. This checks your fasting blood sugar level. Have the screening done:  Once every three years after age 40 if you are at a normal weight and have a low risk for diabetes.  More often and at a younger age if you are overweight or have a high risk for diabetes. What should I know about preventing infection? Hepatitis B If you have a higher risk for hepatitis B, you should be screened for this virus. Talk with your health care provider to find out if you are at risk for hepatitis B infection. Hepatitis C Testing is recommended for:  Everyone born from 1945 through 1965.  Anyone with known risk factors for hepatitis C. Sexually transmitted infections (STIs)  Get screened for STIs, including gonorrhea and chlamydia, if: ? You are sexually active and are younger than 62 years of age. ? You are older than 62 years of age and your health care provider tells you that you are at risk for this type of infection. ? Your sexual activity has changed since you were last screened, and you are at increased risk for chlamydia or gonorrhea. Ask your health care provider   if you are at risk.  Ask your health care provider about whether you are at high risk for HIV. Your health care provider may recommend a prescription medicine to help prevent HIV infection. If you choose to take medicine to prevent HIV, you should first get tested for HIV. You should then be tested every 3 months for as long as you are taking the medicine. Pregnancy  If you are about to stop having your period (premenopausal) and  you may become pregnant, seek counseling before you get pregnant.  Take 400 to 800 micrograms (mcg) of folic acid every day if you become pregnant.  Ask for birth control (contraception) if you want to prevent pregnancy. Osteoporosis and menopause Osteoporosis is a disease in which the bones lose minerals and strength with aging. This can result in bone fractures. If you are 65 years old or older, or if you are at risk for osteoporosis and fractures, ask your health care provider if you should:  Be screened for bone loss.  Take a calcium or vitamin D supplement to lower your risk of fractures.  Be given hormone replacement therapy (HRT) to treat symptoms of menopause. Follow these instructions at home: Lifestyle  Do not use any products that contain nicotine or tobacco, such as cigarettes, e-cigarettes, and chewing tobacco. If you need help quitting, ask your health care provider.  Do not use street drugs.  Do not share needles.  Ask your health care provider for help if you need support or information about quitting drugs. Alcohol use  Do not drink alcohol if: ? Your health care provider tells you not to drink. ? You are pregnant, may be pregnant, or are planning to become pregnant.  If you drink alcohol: ? Limit how much you use to 0-1 drink a day. ? Limit intake if you are breastfeeding.  Be aware of how much alcohol is in your drink. In the [place] one drink equals one 12 oz bottle of beer (355 mL), one 5 oz glass of wine (148 mL), or one 1 oz glass of hard liquor (44 mL). General instructions  Schedule regular health, dental, and eye exams.  Stay current with your vaccines.  Tell your health care provider if: ? You often feel depressed. ? You have ever been abused or do not feel safe at home. Summary  Adopting a healthy lifestyle and getting preventive care are important in promoting health and wellness.  Follow your health care provider's instructions about healthy  diet, exercising, and getting tested or screened for diseases.  Follow your health care provider's instructions on monitoring your cholesterol and blood pressure. This information is not intended to replace advice given to you by your health care provider. Make sure you discuss any questions you have with your health care provider. Document Revised: 05/19/2018 Document Reviewed: 05/19/2018 Elsevier Patient Education  2021 Elsevier Inc.  

## 2020-10-02 ENCOUNTER — Telehealth: Payer: Self-pay

## 2020-10-02 DIAGNOSIS — M1711 Unilateral primary osteoarthritis, right knee: Secondary | ICD-10-CM | POA: Diagnosis not present

## 2020-10-02 NOTE — Progress Notes (Signed)
Chronic Care Management Pharmacy Assistant   Name: Alexis Lloyd  MRN: [id number] DOB: [birth year]  Reason for Encounter:Diabetes Disease State Call.   Recent office visits:  09/19/2020 Huston Foley Just FNP (PCP [place])   Recent consult visits:  08/13/2020 [facility] visits:  None in previous 6 months  Medications: Outpatient Encounter Medications as of 10/02/2020  Medication Sig Note  . acetaminophen (TYLENOL) 650 MG CR tablet Take 650-1,300 mg by mouth every 8 (eight) hours as needed for pain.   Marland Kitchen albuterol (VENTOLIN HFA) 108 (90 Base) MCG/ACT inhaler INHALE 2 PUFFS BY MOUTH EVERY 6 HOURS AS NEEDED   . ascorbic Acid (VITAMIN C) 500 MG CPCR Take 500 mg by mouth daily.   Marland Kitchen aspirin EC 81 MG tablet Take 1 tablet (81 mg total) by mouth daily. Swallow whole.   . Blood Glucose Monitoring Suppl (ONETOUCH VERIO) w/Device KIT To check blood sugar once daily   . cetirizine (ZYRTEC) 10 MG tablet Take 1 tablet (10 mg total) by mouth daily.   . Cholecalciferol (VITAMIN D3) 50 MCG (2000 UT) TABS Take 2,000 Units by mouth.   . Cyanocobalamin (VITAMIN B-12) 3000 MCG SUBL Place 1 tablet under the tongue daily at 6 (six) AM. Gummies   . diclofenac Sodium (VOLTAREN) 1 % GEL Apply 4 g topically 4 (four) times daily as needed.   Marland Kitchen glipiZIDE (GLUCOTROL) 10 MG tablet TAKE 1 TABLET BY MOUTH  TWICE DAILY BEFORE A MEAL   . glucose blood (ONETOUCH VERIO) test strip To check blood sugar once daily   . Lancets (ONETOUCH ULTRASOFT) lancets To check BS once daily   . lisinopril-hydrochlorothiazide (ZESTORETIC) 20-12.5 MG tablet TAKE 2 TABLETS BY MOUTH  DAILY   . montelukast (SINGULAIR) 10 MG tablet Take 1 tablet by mouth once daily   . pantoprazole (PROTONIX) 40 MG tablet Take 1 tablet (40 mg total) by mouth daily.   . rosuvastatin (CRESTOR) 10 MG tablet Take 1 tablet (10 mg total) by mouth daily. (Patient not taking: No sig reported)   . Semaglutide (RYBELSUS) 7 MG TABS Take 7 mg  by mouth daily before breakfast. 07/24/2020: Prescribed by [name], NP   . spironolactone (ALDACTONE) 25 MG tablet Take 1 tablet (25 mg total) by mouth daily.   . SUMAtriptan (IMITREX) 50 MG tablet Take 1 tablet (50 mg total) by mouth every 2 (two) hours as needed for migraine. No more than 223m in 24 hours   . verapamil (CALAN) 80 MG tablet TAKE 1 TABLET BY MOUTH  TWICE DAILY   . zinc gluconate 50 MG tablet Take 50 mg by mouth daily.    No facility-administered encounter medications on file as of 10/02/2020.   Star Rating Drugs: Glipizide 10 mg [facility] 10 mg [facility] Rybelsu 7 mg [facility]  Recent Relevant Labs: Lab Results  Component Value Date/Time   HGBA1C 7.3 (H) 08/01/2020 11:27 AM   HGBA1C 7.6 (H) 06/11/2020 03:07 AM   MICROALBUR 50 08/31/2017 10:06 AM   MICROALBUR 50 03/05/2016 02:04 PM    Kidney Function Lab Results  Component Value Date/Time   CREATININE 0.93 08/01/2020 11:27 AM   CREATININE 0.62 06/15/2020 02:48 AM   CREATININE 0.75 04/14/2014 09:25 AM   CREATININE 0.71 11/02/2013 10:07 AM   GFRNONAA 67 08/01/2020 11:27 AM  GFRNONAA >60 06/15/2020 02:48 AM   GFRNONAA >60 04/14/2014 09:25 AM   GFRNONAA >60 11/02/2013 10:07 AM   GFRAA 77 08/01/2020 11:27 AM   GFRAA >60 04/14/2014 09:25 AM   GFRAA >60 11/02/2013 10:07 AM    . Current antihyperglycemic regimen:   Glipizide 10 mg twice daily   Pioglitazone 45 mg daily   Rybelsus 7 mg daily (not taking)  . What recent interventions/DTPs have been made to improve glycemic control:  o None ID . Have there been any recent hospitalizations or ED visits since last visit with CPP? No .   Adherence Review: Is the patient currently on a STATIN medication? Yes Is the patient currently on ACE/ARB medication? No Does the patient have >5  day gap between last estimated fill dates? No   I have attempted without success to contact this patient by phone three times to do her Diabetes Disease State call. I left a Voice message for patient to return my call.  Left voice message on  04/26,04/28,04/29  [place] Pharmacist Assistant [phone]

## 2020-10-05 DIAGNOSIS — M1711 Unilateral primary osteoarthritis, right knee: Secondary | ICD-10-CM | POA: Diagnosis not present

## 2020-10-09 ENCOUNTER — Ambulatory Visit (INDEPENDENT_AMBULATORY_CARE_PROVIDER_SITE_OTHER): Payer: Medicare Other | Admitting: Family Medicine

## 2020-10-09 ENCOUNTER — Other Ambulatory Visit: Payer: Self-pay

## 2020-10-09 ENCOUNTER — Encounter: Payer: Self-pay | Admitting: Family Medicine

## 2020-10-09 VITALS — BP 123/78 | HR 86 | Temp 98.6°F | Resp 16 | Wt 230.0 lb

## 2020-10-09 DIAGNOSIS — J069 Acute upper respiratory infection, unspecified: Secondary | ICD-10-CM

## 2020-10-09 NOTE — Patient Instructions (Signed)
Go get rapid COVID test to be sure  Flonase Check on the tylenol Hydrate well   Upper Respiratory Infection, Adult An upper respiratory infection (URI) affects the nose, throat, and upper air passages. URIs are caused by germs (viruses). The most common type of URI is often called "the common cold." Medicines cannot cure URIs, but you can do things at home to relieve your symptoms. URIs usually get better within 7-10 days. Follow these instructions at home: Activity  Rest as needed.  If you have a fever, stay home from work or school until your fever is gone, or until your doctor says you may return to work or school. ? You should stay home until you cannot spread the infection anymore (you are not contagious). ? Your doctor may have you wear a face mask so you have less risk of spreading the infection. Relieving symptoms  Gargle with a salt-water mixture 3-4 times a day or as needed. To make a salt-water mixture, completely dissolve -1 tsp of salt in 1 cup of warm water.  Use a cool-mist humidifier to add moisture to the air. This can help you breathe more easily. Eating and drinking  Drink enough fluid to keep your pee (urine) pale yellow.  Eat soups and other clear broths.   General instructions  Take over-the-counter and prescription medicines only as told by your doctor. These include cold medicines, fever reducers, and cough suppressants.  Do not use any products that contain nicotine or tobacco. These include cigarettes and e-cigarettes. If you need help quitting, ask your doctor.  Avoid being where people are smoking (avoid secondhand smoke).  Make sure you get regular shots and get the flu shot every year.  Keep all follow-up visits as told by your doctor. This is important.   How to avoid spreading infection to others  Wash your hands often with soap and water. If you do not have soap and water, use hand sanitizer.  Avoid touching your mouth, face, eyes, or  nose.  Cough or sneeze into a tissue or your sleeve or elbow. Do not cough or sneeze into your hand or into the air.   Contact a doctor if:  You are getting worse, not better.  You have any of these: ? A fever. ? Chills. ? Brown or red mucus in your nose. ? Yellow or brown fluid (discharge)coming from your nose. ? Pain in your face, especially when you bend forward. ? Swollen neck glands. ? Pain with swallowing. ? White areas in the back of your throat. Get help right away if:  You have shortness of breath that gets worse.  You have very bad or constant: ? Headache. ? Ear pain. ? Pain in your forehead, behind your eyes, and over your cheekbones (sinus pain). ? Chest pain.  You have long-lasting (chronic) lung disease along with any of these: ? Wheezing. ? Long-lasting cough. ? Coughing up blood. ? A change in your usual mucus.  You have a stiff neck.  You have changes in your: ? Vision. ? Hearing. ? Thinking. ? Mood. Summary  An upper respiratory infection (URI) is caused by a germ called a virus. The most common type of URI is often called "the common cold."  URIs usually get better within 7-10 days.  Take over-the-counter and prescription medicines only as told by your doctor. This information is not intended to replace advice given to you by your health care provider. Make sure you discuss any questions you have with  your health care provider. Document Revised: 02/02/2020 Document Reviewed: 02/02/2020 Elsevier Patient Education  [place].

## 2020-10-09 NOTE — Progress Notes (Signed)
Established patient visit   Patient: Alexis Lloyd   DOB: [birth year]   60 y.o. Female  MRN: [id number] Visit Date: 10/09/2020  Today's healthcare provider: Lavon Paganini, MD   Chief Complaint  Patient presents with  . Ear Pain   Subjective    HPI  Upper respiratory symptoms She complains of left ear pressure/pain, nasal congestion, sinus pressure and sore throat.with no fever, chills, night sweats or weight loss. Onset of symptoms was a few days ago and staying constant.She is drinking plenty of fluids.  Past history is significant for asthma. Patient is non-smoker  covid vax x3 no known sick contacts symptoms x 5 days Tried throat lozenge Feels like previous ear infection or sinus infection ---------------------------------------------------------------------------------------------------    Patient Active Problem List   Diagnosis Date Noted  . Pain due to onychomycosis of toenails of both feet 10/24/2019  . Benign skin lesion 10/24/2019  . Recurrent UTI 01/22/2018  . Recurrent vaginitis 01/22/2018  . History of Helicobacter pylori infection 11/05/2017  . Intrinsic eczema 11/05/2017  . CLE (columnar lined esophagus)   . Stomach irritation   . Gastric polyp   . Problems with swallowing and mastication   . Heartburn   . Benign neoplasm of transverse colon   . Internal hemorrhoids   . Diverticulosis of large intestine without diverticulitis   . Warts of foot 06/30/2017  . Achilles tendon contracture 01/28/2017  . Hallux rigidus of left foot 01/28/2017  . Hallux rigidus, right foot 01/28/2017  . Pes planus 01/28/2017  . Posterior tibial tendinitis of left lower extremity 01/28/2017  . Posterior tibial tendinitis of right lower extremity 01/28/2017  . Asthma 09/03/2015  . Foot pain 07/16/2015  . 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 Abnormal finding on mammography, microcalcification 04/25/2015  . Intraductal carcinoma of breast  04/24/2015  . Airway hyperreactivity 11/18/2014  . Breast cyst 11/18/2014  . Diabetic neuropathy ([place]) 11/18/2014  . GERD (gastroesophageal reflux disease) 11/18/2014  . 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 Hypercholesteremia 11/18/2014  . Hypertension 11/18/2014  . Class 2 severe obesity due to excess calories with serious comorbidity and body mass index (BMI) of 39.0 to 39.9 in adult ([place]) 11/18/2014  . Arthritis, degenerative 11/18/2014  . Allergic rhinitis, seasonal 11/18/2014  . OSA (obstructive sleep apnea) 11/18/2014  . AC (acromioclavicular) arthritis 11/08/2014  . Personal history of breast cancer 10/04/2014  . Primary osteoarthritis of right knee 11/14/2013   Social History   Tobacco Use  . Smoking status: Former Smoker    Packs/day: 0.25    Years: 21.00    Pack years: 5.25    Quit date: 06/08/1997    Years since quitting: 23.3  . Smokeless tobacco: Never Used  Vaping Use  . Vaping Use: Never used  Substance Use Topics  . Alcohol use: No  . Drug use: No   Allergies  Allergen Reactions  . Allegra [Fexofenadine] Nausea And Vomiting  . Avelox [Moxifloxacin] Other (See Comments)    Hallucinations  . Bactrim [Sulfamethoxazole-Trimethoprim] Other (See Comments)    unknown  . Etodolac Nausea Only  . Fish-Derived Products Nausea And Vomiting    With "seafood"  . Penicillins Diarrhea and Nausea And Vomiting  . Shellfish Allergy Nausea Only  . Sulfa Antibiotics Other (See Comments)    unknown  . Macrobid [employer] Macro] Other (See Comments)    Constipation and globus hystericus.       Medications: Outpatient Medications Prior to Visit  Medication  Sig  . acetaminophen (TYLENOL) 650 MG CR tablet Take 650-1,300 mg by mouth every 8 (eight) hours as needed for pain.  Marland Kitchen albuterol (VENTOLIN HFA) 108 (90 Base) MCG/ACT inhaler INHALE 2 PUFFS BY MOUTH EVERY 6 HOURS AS NEEDED  . ascorbic Acid (VITAMIN C) 500 MG CPCR Take 500 mg by mouth daily.  Marland Kitchen aspirin EC  81 MG tablet Take 1 tablet (81 mg total) by mouth daily. Swallow whole.  . Blood Glucose Monitoring Suppl (ONETOUCH VERIO) w/Device KIT To check blood sugar once daily  . cetirizine (ZYRTEC) 10 MG tablet Take 1 tablet (10 mg total) by mouth daily.  . Cholecalciferol (VITAMIN D3) 50 MCG (2000 UT) TABS Take 2,000 Units by mouth.  . Cyanocobalamin (VITAMIN B-12) 3000 MCG SUBL Place 1 tablet under the tongue daily at 6 (six) AM. Gummies  . diclofenac Sodium (VOLTAREN) 1 % GEL Apply 4 g topically 4 (four) times daily as needed.  Marland Kitchen glipiZIDE (GLUCOTROL) 10 MG tablet TAKE 1 TABLET BY MOUTH  TWICE DAILY BEFORE A MEAL  . glucose blood (ONETOUCH VERIO) test strip To check blood sugar once daily  . Lancets (ONETOUCH ULTRASOFT) lancets To check BS once daily  . lisinopril-hydrochlorothiazide (ZESTORETIC) 20-12.5 MG tablet TAKE 2 TABLETS BY MOUTH  DAILY  . montelukast (SINGULAIR) 10 MG tablet Take 1 tablet by mouth once daily  . pantoprazole (PROTONIX) 40 MG tablet Take 1 tablet (40 mg total) by mouth daily.  . rosuvastatin (CRESTOR) 10 MG tablet Take 1 tablet (10 mg total) by mouth daily.  . Semaglutide (RYBELSUS) 7 MG TABS Take 7 mg by mouth daily before breakfast.  . spironolactone (ALDACTONE) 25 MG tablet Take 1 tablet (25 mg total) by mouth daily.  . SUMAtriptan (IMITREX) 50 MG tablet Take 1 tablet (50 mg total) by mouth every 2 (two) hours as needed for migraine. No more than 239m in 24 hours  . verapamil (CALAN) 80 MG tablet TAKE 1 TABLET BY MOUTH  TWICE DAILY  . zinc gluconate 50 MG tablet Take 50 mg by mouth daily.   No facility-administered medications prior to visit.    Review of Systems  Constitutional: Negative for appetite change, chills and fever.  HENT: Positive for congestion, ear pain, sinus pressure and sore throat.   Respiratory: Negative for cough, shortness of breath and wheezing.   Cardiovascular: Negative for chest pain and palpitations.    {Labs  Heme  Chem  Endocrine   Serology  Results Review (optional):[id number]::" "}    Objective    BP 123/78 (BP Location: Right Arm, Patient Position: Sitting, Cuff Size: Large)   Pulse 86   Temp 98.6 F (37 C) (Oral)   Resp 16   Wt 230 lb (104.3 kg)   SpO2 100%   BMI 47.25 kg/m  BP Readings from Last 3 Encounters:  10/09/20 123/78  09/19/20 (!) 121/92  08/01/20 116/69   Wt Readings from Last 3 Encounters:  10/09/20 230 lb (104.3 kg)  09/19/20 230 lb (104.3 kg)  08/01/20 233 lb (105.7 kg)      Physical Exam Vitals and nursing note reviewed.  Constitutional:      General: She is not in acute distress.    Appearance: Normal appearance. She is well-developed. She is not diaphoretic.  HENT:     Head: Normocephalic and atraumatic.     Right Ear: Tympanic membrane, ear canal and external ear normal.     Left Ear: Tympanic membrane, ear canal and external ear normal.  Nose: Nose normal. No congestion.     Mouth/Throat:     Mouth: Mucous membranes are moist.     Pharynx: Oropharynx is clear. No oropharyngeal exudate or posterior oropharyngeal erythema.  Eyes:     General: No scleral icterus.    Conjunctiva/sclera: Conjunctivae normal.     Pupils: Pupils are equal, round, and reactive to light.  Neck:     Thyroid: No thyromegaly.  Cardiovascular:     Rate and Rhythm: Normal rate and regular rhythm.     Pulses: Normal pulses.     Heart sounds: Normal heart sounds. No murmur heard.   Pulmonary:     Effort: Pulmonary effort is normal. No respiratory distress.     Breath sounds: Normal breath sounds. No wheezing, rhonchi or rales.  Musculoskeletal:     Cervical back: Neck supple.  Lymphadenopathy:     Cervical: No cervical adenopathy.  Skin:    General: Skin is warm and dry.     Findings: No rash.  Neurological:     Mental Status: She is alert and oriented to person, place, and time. Mental status is at baseline.  Psychiatric:        Mood and Affect: Mood normal.        Behavior: Behavior  normal.       No results found for any visits on 10/09/20.  Assessment & Plan     1. Viral URI - symptoms c/w viral URI  - no evidence of strep pharyngitis, CAP, AOM, bacterial sinusitis, or other bacterial infection - cannot exclude possible COVID19 infection - will send for outpatient testing - discussed need to quarantine pending test results - discussed symptomatic management, natural course, and return precautions     Return if symptoms worsen or fail to improve.      I, Lavon Paganini, MD, have reviewed all documentation for this visit. The documentation on 10/09/20 for the exam, diagnosis, procedures, and orders are all accurate and complete.   Shloimy Michalski, Dionne Bucy, MD, MPH [facility]

## 2020-10-10 DIAGNOSIS — Z01812 Encounter for preprocedural laboratory examination: Secondary | ICD-10-CM | POA: Diagnosis not present

## 2020-10-10 DIAGNOSIS — M1711 Unilateral primary osteoarthritis, right knee: Secondary | ICD-10-CM | POA: Diagnosis not present

## 2020-10-11 DIAGNOSIS — Z882 Allergy status to sulfonamides status: Secondary | ICD-10-CM | POA: Diagnosis not present

## 2020-10-11 DIAGNOSIS — M25561 Pain in right knee: Secondary | ICD-10-CM | POA: Diagnosis not present

## 2020-10-11 DIAGNOSIS — Z888 Allergy status to other drugs, medicaments and biological substances status: Secondary | ICD-10-CM | POA: Diagnosis not present

## 2020-10-11 DIAGNOSIS — E119 Type 2 diabetes mellitus without complications: Secondary | ICD-10-CM | POA: Diagnosis not present

## 2020-10-11 DIAGNOSIS — Z853 Personal history of malignant neoplasm of breast: Secondary | ICD-10-CM | POA: Diagnosis not present

## 2020-10-11 DIAGNOSIS — Z880 Allergy status to penicillin: Secondary | ICD-10-CM | POA: Diagnosis not present

## 2020-10-11 DIAGNOSIS — K219 Gastro-esophageal reflux disease without esophagitis: Secondary | ICD-10-CM | POA: Diagnosis not present

## 2020-10-11 DIAGNOSIS — M1711 Unilateral primary osteoarthritis, right knee: Secondary | ICD-10-CM | POA: Diagnosis not present

## 2020-10-11 DIAGNOSIS — E785 Hyperlipidemia, unspecified: Secondary | ICD-10-CM | POA: Diagnosis not present

## 2020-10-11 DIAGNOSIS — G8918 Other acute postprocedural pain: Secondary | ICD-10-CM | POA: Diagnosis not present

## 2020-10-11 DIAGNOSIS — J45909 Unspecified asthma, uncomplicated: Secondary | ICD-10-CM | POA: Diagnosis not present

## 2020-10-11 DIAGNOSIS — Z8709 Personal history of other diseases of the respiratory system: Secondary | ICD-10-CM | POA: Diagnosis not present

## 2020-10-11 DIAGNOSIS — Z7984 Long term (current) use of oral hypoglycemic drugs: Secondary | ICD-10-CM | POA: Diagnosis not present

## 2020-10-11 DIAGNOSIS — K2270 Barrett's esophagus without dysplasia: Secondary | ICD-10-CM | POA: Diagnosis not present

## 2020-10-11 DIAGNOSIS — E1140 Type 2 diabetes mellitus with diabetic neuropathy, unspecified: Secondary | ICD-10-CM | POA: Diagnosis not present

## 2020-10-11 DIAGNOSIS — Z91013 Allergy to seafood: Secondary | ICD-10-CM | POA: Diagnosis not present

## 2020-10-11 DIAGNOSIS — I10 Essential (primary) hypertension: Secondary | ICD-10-CM | POA: Diagnosis not present

## 2020-10-12 DIAGNOSIS — K2270 Barrett's esophagus without dysplasia: Secondary | ICD-10-CM | POA: Diagnosis not present

## 2020-10-12 DIAGNOSIS — Z853 Personal history of malignant neoplasm of breast: Secondary | ICD-10-CM | POA: Diagnosis not present

## 2020-10-12 DIAGNOSIS — Z91013 Allergy to seafood: Secondary | ICD-10-CM | POA: Diagnosis not present

## 2020-10-12 DIAGNOSIS — Z7984 Long term (current) use of oral hypoglycemic drugs: Secondary | ICD-10-CM | POA: Diagnosis not present

## 2020-10-12 DIAGNOSIS — I10 Essential (primary) hypertension: Secondary | ICD-10-CM | POA: Diagnosis not present

## 2020-10-12 DIAGNOSIS — E1140 Type 2 diabetes mellitus with diabetic neuropathy, unspecified: Secondary | ICD-10-CM | POA: Diagnosis not present

## 2020-10-12 DIAGNOSIS — Z888 Allergy status to other drugs, medicaments and biological substances status: Secondary | ICD-10-CM | POA: Diagnosis not present

## 2020-10-12 DIAGNOSIS — M1711 Unilateral primary osteoarthritis, right knee: Secondary | ICD-10-CM | POA: Diagnosis not present

## 2020-10-12 DIAGNOSIS — Z8709 Personal history of other diseases of the respiratory system: Secondary | ICD-10-CM | POA: Diagnosis not present

## 2020-10-12 DIAGNOSIS — Z880 Allergy status to penicillin: Secondary | ICD-10-CM | POA: Diagnosis not present

## 2020-10-12 DIAGNOSIS — J45909 Unspecified asthma, uncomplicated: Secondary | ICD-10-CM | POA: Diagnosis not present

## 2020-10-12 DIAGNOSIS — Z882 Allergy status to sulfonamides status: Secondary | ICD-10-CM | POA: Diagnosis not present

## 2020-10-12 DIAGNOSIS — Z96651 Presence of right artificial knee joint: Secondary | ICD-10-CM | POA: Diagnosis not present

## 2020-10-12 DIAGNOSIS — K219 Gastro-esophageal reflux disease without esophagitis: Secondary | ICD-10-CM | POA: Diagnosis not present

## 2020-10-12 DIAGNOSIS — E785 Hyperlipidemia, unspecified: Secondary | ICD-10-CM | POA: Diagnosis not present

## 2020-10-18 DIAGNOSIS — Z96651 Presence of right artificial knee joint: Secondary | ICD-10-CM | POA: Diagnosis not present

## 2020-10-19 ENCOUNTER — Telehealth: Payer: Self-pay

## 2020-10-19 NOTE — Chronic Care Management (AMB) (Signed)
10/19/2020- Called patient to remind of appointment with [name], CPP on 10/22/2020 at 2 pm. No answer, left message of appointment date, time and to have all medication, supplements, blood pressure and/or blood sugar logs near during telephone visit. Notified to call back if needing to reschedule visit.   Alexis Lloyd, [place]

## 2020-10-22 ENCOUNTER — Ambulatory Visit: Payer: Self-pay

## 2020-10-22 ENCOUNTER — Telehealth: Payer: Self-pay

## 2020-10-22 NOTE — Telephone Encounter (Signed)
Attempted to return this pt's call 3 times without success at [phone].   Forwarding her request to [facility].  Pt's message:  She is constipated, been taking stool softeners.  Is there anything else she can do?

## 2020-10-22 NOTE — Progress Notes (Deleted)
Chronic Care Management Pharmacy Note  10/22/2020 Name:  Alexis Lloyd MRN:  [id number] DOB:  August 25, 1960  Subjective: Alexis Lloyd is an 60 y.o. year old female who is a primary patient of Bacigalupo, Dionne Bucy, MD.  The CCM team was consulted for assistance with disease management and care coordination needs.    Engaged with patient by telephone for follow up visit in response to provider referral for pharmacy case management and/or care coordination services.   Consent to Services:  The patient was given information about Chronic Care Management services, agreed to services, and gave verbal consent prior to initiation of services.  Please see initial visit note for detailed documentation.   Patient Care Team: Virginia Crews, MD as PCP - General (Family Medicine) Imagene Riches, CNM as Midwife (Obstetrics) Germaine Pomfret, [facility] (Pharmacist) [name], NP as Nurse Practitioner (Endocrinology)  Recent office visits: 06/27/20: Video visit with Fenton Malling, PA-C for acute respiratory failure. Terconazole stopped.  05/07/20: Patient presented to Fenton Malling, PA-C for follow-up. Metformin stopped due to diarrhea and incontinence. Patient started on pioglitazone. Omeprazole changed to pantoprazole. Fluconazole started for yeast infection.   Recent consult visits: 08/13/20: Patient presented to Malissa Hippo, NP for follow-up.   Hospital visits: 1/2-06/15/20: Patient hospitalized for Covid infection. Patient treated with IV decadron, Zofran, and Remdesivir.   Objective:  Lab Results  Component Value Date   CREATININE 0.93 08/01/2020   BUN 24 08/01/2020   GFRNONAA 67 08/01/2020   GFRAA 77 08/01/2020   NA 142 08/01/2020   K 4.6 08/01/2020   CALCIUM 9.7 08/01/2020   CO2 23 08/01/2020    Lab Results  Component Value Date/Time   HGBA1C 7.3 (H) 08/01/2020 11:27 AM   HGBA1C 7.6 (H) 06/11/2020 03:07 AM   MICROALBUR 50 08/31/2017 10:06 AM    MICROALBUR 50 03/05/2016 02:04 PM    Last diabetic Eye exam:  Lab Results  Component Value Date/Time   HMDIABEYEEXA No Retinopathy 03/06/2020 12:00 AM    Last diabetic Foot exam: No results found for: HMDIABFOOTEX   Lab Results  Component Value Date   CHOL 233 (H) 08/01/2020   HDL 38 (L) 08/01/2020   LDLCALC 178 (H) 08/01/2020   TRIG 95 08/01/2020   CHOLHDL 7.1 (H) 09/16/2018    Hepatic Function Latest Ref Rng & Units 08/01/2020 06/15/2020 06/14/2020  Total Protein 6.0 - 8.5 g/dL 7.1 6.4(L) 6.8  Albumin 3.8 - 4.8 g/dL 4.3 2.8(L) 2.8(L)  AST 0 - 40 IU/L 8 11(L) 11(L)  ALT 0 - 32 IU/L _0 Alk Phosphatase 44 - 121 IU/L 64 45 46  Total Bilirubin 0.0 - 1.2 mg/dL 0.3 0.6 0.5    Lab Results  Component Value Date/Time   TSH 2.190 09/16/2018 11:12 AM   TSH 2.510 08/31/2017 10:50 AM    CBC Latest Ref Rng & Units 08/01/2020 06/10/2020 07/07/2019  WBC 3.4 - 10.8 x10E3/uL 6.5 10.7(H) 9.4  Hemoglobin 11.1 - 15.9 g/dL 11.0(L) 11.2(L) 11.9  Hematocrit 34.0 - 46.6 % 33.2(L) 33.9(L) 34.9  Platelets 150 - 450 x10E3/uL 243 308 324    No results found for: VD25OH  Clinical ASCVD: No  The 10-year ASCVD risk score Mikey Bussing DC Jr., et al., 2013) is: 20.9%   Values used to calculate the score:     Age: 33 years     Sex: Female     Is Non-Hispanic African American: Yes     Diabetic: Yes     Tobacco  smoker: No     Systolic Blood Pressure: 675 mmHg     Is BP treated: Yes     HDL Cholesterol: 38 mg/dL     Total Cholesterol: 233 mg/dL    Depression screen [facility] 2/9 10/09/2020 08/01/2020 10/28/2019  Decreased Interest 0 0 0  Down, Depressed, Hopeless 0 0 0  PHQ - 2 Score 0 0 0  Altered sleeping 0 0 -  Tired, decreased energy 0 0 -  Change in appetite 0 0 -  Feeling bad or failure about yourself  0 0 -  Trouble concentrating 0 0 -  Moving slowly or fidgety/restless 0 0 -  Suicidal thoughts 0 0 -  PHQ-9 Score 0 0 -  Difficult doing work/chores Not difficult at all Not difficult at all -  Some  recent data might be hidden     Social History   Tobacco Use  Smoking Status Former Smoker  . Packs/day: 0.25  . Years: 21.00  . Pack years: 5.25  . Quit date: 06/08/1997  . Years since quitting: 23.3  Smokeless Tobacco Never Used   BP Readings from Last 3 Encounters:  10/09/20 123/78  09/19/20 (!) 121/92  08/01/20 116/69   Pulse Readings from Last 3 Encounters:  10/09/20 86  09/19/20 90  08/01/20 88   Wt Readings from Last 3 Encounters:  10/09/20 230 lb (104.3 kg)  09/19/20 230 lb (104.3 kg)  08/01/20 233 lb (105.7 kg)    Assessment/Interventions: Review of patient past medical history, allergies, medications, health status, including review of consultants reports, laboratory and other test data, was performed as part of comprehensive evaluation and provision of chronic care management services.   SDOH:  (Social Determinants of Health) assessments and interventions performed: Yes   CCM Care Plan  Allergies  Allergen Reactions  . Allegra [Fexofenadine] Nausea And Vomiting  . Avelox [Moxifloxacin] Other (See Comments)    Hallucinations  . Bactrim [Sulfamethoxazole-Trimethoprim] Other (See Comments)    unknown  . Etodolac Nausea Only  . Fish-Derived Products Nausea And Vomiting    With "seafood"  . Penicillins Diarrhea and Nausea And Vomiting  . Shellfish Allergy Nausea Only  . Sulfa Antibiotics Other (See Comments)    unknown  . Macrobid [employer] Macro] Other (See Comments)    Constipation and globus hystericus.    Medications Reviewed Today    Reviewed by Virginia Crews, MD (Physician) on 10/09/20 at [phone]  Med List Status: <None>  Medication Order Taking? Sig Documenting Provider Last Dose Status Informant  acetaminophen (TYLENOL) 650 MG CR tablet [id number] Yes Take 650-1,300 mg by mouth every 8 (eight) hours as needed for pain. [provider] Taking Active   albuterol (VENTOLIN HFA) 108 (90 Base) MCG/ACT inhaler [id number] Yes  INHALE 2 PUFFS BY MOUTH EVERY 6 HOURS AS NEEDED Mar Daring, PA-C Taking Active   ascorbic Acid (VITAMIN C) 500 MG CPCR [id number] Yes Take 500 mg by mouth daily. [provider] Taking Active   aspirin EC 81 MG tablet [id number] Yes Take 1 tablet (81 mg total) by mouth daily. Swallow whole. Mar Daring, PA-C Taking Active   Blood Glucose Monitoring Suppl [facility] VERIO) w/Device Drucie Opitz [id number] Yes To check blood sugar once daily Mar Daring, PA-C Taking Active   cetirizine (ZYRTEC) 10 MG tablet [id number] Yes Take 1 tablet (10 mg total) by mouth daily. Margarita Rana, MD Taking Active Other  Cholecalciferol (VITAMIN D3) 50 MCG (2000 UT) TABS [id number] Yes Take 2,000 Units by  mouth. [provider] Taking Active   Cyanocobalamin (VITAMIN B-12) 3000 MCG SUBL [id number] Yes Place 1 tablet under the tongue daily at 6 (six) AM. Gummies [provider] Taking Active   diclofenac Sodium (VOLTAREN) 1 % GEL [id number] Yes Apply 4 g topically 4 (four) times daily as needed. [provider] Taking Active   glipiZIDE (GLUCOTROL) 10 MG tablet [id number] Yes TAKE 1 TABLET BY MOUTH  TWICE DAILY BEFORE A MEAL Mar Daring, PA-C Taking Active   glucose blood (ONETOUCH VERIO) test strip [id number] Yes To check blood sugar once daily Mar Daring, PA-C Taking Active Other  Lancets [facility] ULTRASOFT) lancets [id number] Yes To check BS once daily Mar Daring, PA-C Taking Active   lisinopril-hydrochlorothiazide (ZESTORETIC) 20-12.5 MG tablet [id number] Yes TAKE 2 TABLETS BY MOUTH  DAILY Mar Daring, PA-C Taking Active Other  montelukast (SINGULAIR) 10 MG tablet [id number] Yes Take 1 tablet by mouth once daily Fenton Malling M, PA-C Taking Active   pantoprazole (PROTONIX) 40 MG tablet [id number] Yes Take 1 tablet (40 mg total) by mouth daily. Mar Daring, PA-C Taking Active   rosuvastatin (CRESTOR) 10 MG tablet [id number] Yes  Take 1 tablet (10 mg total) by mouth daily. Fenton Malling M, PA-C Taking Active   Semaglutide (RYBELSUS) 7 MG TABS [id number] Yes Take 7 mg by mouth daily before breakfast. [provider] Taking Active            Med Note Michaelle Birks, Cathe Mons A   Tue Jul 24, 2020  8:46 AM) Prescribed by [name], NP   spironolactone (ALDACTONE) 25 MG tablet [id number] Yes Take 1 tablet (25 mg total) by mouth daily. Mar Daring, PA-C Taking Active Other  SUMAtriptan (IMITREX) 50 MG tablet [id number] Yes Take 1 tablet (50 mg total) by mouth every 2 (two) hours as needed for migraine. No more than 274m in 24 hours BMar Daring PVermontTaking Active Other  verapamil (CALAN) 80 MG tablet [id number] TAKE 1 TABLET BY MOUTH  TWICE DAILY Bacigalupo, ADionne Bucy MD Taking Active   zinc gluconate 50 MG tablet [id number] Take 50 mg by mouth daily. [provider] Taking Active           Patient Active Problem List   Diagnosis Date Noted  . Pain due to onychomycosis of toenails of both feet 10/24/2019  . Benign skin lesion 10/24/2019  . Recurrent UTI 01/22/2018  . Recurrent vaginitis 01/22/2018  . History of Helicobacter pylori infection 11/05/2017  . Intrinsic eczema 11/05/2017  . CLE (columnar lined esophagus)   . Stomach irritation   . Gastric polyp   . Problems with swallowing and mastication   . Heartburn   . Benign neoplasm of transverse colon   . Internal hemorrhoids   . Diverticulosis of large intestine without diverticulitis   . Warts of foot 06/30/2017  . Achilles tendon contracture 01/28/2017  . Hallux rigidus of left foot 01/28/2017  . Hallux rigidus, right foot 01/28/2017  . Pes planus 01/28/2017  . Posterior tibial tendinitis of left lower extremity 01/28/2017  . Posterior tibial tendinitis of right lower extremity 01/28/2017  . Asthma 09/03/2015  . Foot pain 07/16/2015  . DM type 2, uncontrolled, with neuropathy ([place] 07/13/2015  . Abnormal  finding on mammography, microcalcification 04/25/2015  . Intraductal carcinoma of breast 04/24/2015  . Airway hyperreactivity 11/18/2014  . Breast cyst 11/18/2014  . Diabetic neuropathy ([place] 11/18/2014  . GERD (gastroesophageal reflux disease) 11/18/2014  . 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 Hypercholesteremia 11/18/2014  . Hypertension 11/18/2014  . Class 2 severe obesity due to excess calories with serious comorbidity and body mass index (BMI) of 39.0 to 39.9 in adult ([place]) 11/18/2014  . Arthritis, degenerative 11/18/2014  . Allergic rhinitis, seasonal 11/18/2014  . OSA (obstructive sleep apnea) 11/18/2014  . AC (acromioclavicular) arthritis 11/08/2014  . Personal history of breast cancer 10/04/2014  . Primary osteoarthritis of right knee 11/14/2013    Immunization History  Administered Date(s) Administered  . PFIZER Comirnaty(Gray Top)Covid-19 Tri-Sucrose Vaccine 08/15/2020  . PFIZER(Purple Top)SARS-COV-2 Vaccination 07/31/2019, 09/13/2019  . Pneumococcal Polysaccharide-23 06/22/2009, 08/06/2015  . Tdap 08/31/2017    Conditions to be addressed/monitored:  Hypertension, Hyperlipidemia, Diabetes, GERD, Asthma and Osteoarthritis  There are no care plans that you recently modified to display for this patient.    Medication Assistance: Application for Rybelsus  medication assistance program. in process.  Anticipated assistance start date 08/07/2020.  See plan of care for additional detail.  Patient's preferred pharmacy is:  [facility] [address]), [place]) [place] [address] Phone: [phone] Fax: [place], [address], [address] [place], [address] [place] [address] Phone: [phone] Fax: [phone]  Uses pill box? Yes Pt endorses 100% compliance  We discussed: Current pharmacy is preferred with insurance plan and patient is  satisfied with pharmacy services Patient decided to: Continue current medication management strategy  Care Plan and Follow Up Patient Decision:  Patient agrees to Care Plan and Follow-up.  Plan: Telephone follow up appointment with care management team member scheduled for:  10/22/2020 at 2:00 PM  [place] [phone]  Current Barriers:  . Unable to independently afford treatment regimen  Pharmacist Clinical Goal(s):  Marland Kitchen Over the next 90 days, patient will verbalize ability to afford treatment regimen . maintain control of Diabetes as evidenced by A1c less than 8%  through collaboration with PharmD and provider.    Interventions: . 1:1 collaboration with Mar Daring, PA-C regarding development and update of comprehensive plan of care as evidenced by provider attestation and co-signature . Inter-disciplinary care team collaboration (see longitudinal plan of care) . Comprehensive medication review performed; medication list updated in electronic medical record  Hypertension (BP goal <140/90) -controlled -Current treatment: . Lisinopril-HCTZ 20-12.5 mg 2 tablets daily  . Spironolactone 25 mg daily  . Verapamil 80 mg daily  -Medications previously tried: Amlodipine, spironolactone   -Current home readings: NA -Current dietary habits: Limiting fried food. Eating more vegetables  -Current exercise habits: Limited exercise due to knee pain.  -Denies hypotensive/hypertensive symptoms -Educated on Daily salt intake goal < 2300 mg; -Counseled to monitor BP at home if feeling symptomatic, document, and provide log at future appointments -Recommended to continue current medication  Hyperlipidemia: (LDL goal < 70) -uncontrolled -Current treatment: . None -Medications previously tried: Pravastatin, Lovastatin -Patient unsure why previous statins were stopped or if she had intolerances to previously used medications.    -Educated on Cholesterol goals;  Benefits of statin for ASCVD risk reduction; Importance of limiting foods high in cholesterol; -Recommended rechecking lipid panel   -Recommended starting rosuvastatin 10 mg daily   Diabetes (A1c goal <8%) -Managed by [name], NP  -controlled -Current medications: Marland Kitchen Glipizide 10 mg twice daily  . Pioglitazone 45 mg daily  . Rybelsus 7 mg daily (not taking)   -Medications previously tried: Januvia,  Metformin (diarrhea)  -Current home glucose readings . fasting glucose: 185, 169, 159 -Denies hypoglycemic/hyperglycemic symptoms -Educated on A1c and blood sugar goals; Exercise goal of 150 minutes per week; -Counseled to check feet daily and get yearly eye exams -Assessed patient finances. Rybelsus patient assistance application submitted on 07/24/20 to [employer]  Asthma (Goal: control symptoms and prevent exacerbations) -controlled -Current treatment  . Ventolin HFA 108 mcg/act 2 puff every 6 hours as needed  . Montelukast 10 mg daily  -Medications previously tried: NA  -Pulmonary function testing: NA -Exacerbations requiring treatment in last 6 months: Yes, due to recent covid infection -Patient denies consistent use of maintenance inhaler -Frequency of rescue inhaler use: twice weekly -Counseled on Proper inhaler technique; When to use rescue inhaler -Recommended to continue current medication  GERD (Goal: prevent symptoms of heartburn and reflux) -controlled -Current treatment  . Pantoprazole 40 mg daily  -Medications previously tried: NA  -Recommended to continue current medication  Osteoarthritis (Goal: minimize arthritis pain) -uncontrolled -Current treatment  . Acetaminophen CR 650 mg (not started)  . Voltaren 1% gel  (not started)  -Wants to get a knee replacement, has been previously receiving synvisc injections, but no longer feels she gets benefits from them.  -Counseled on risks and benefits of topical NSAID  use -Counseled to avoid greater than 3000 mg of acetaminophen DAILY  -Recommended to continue current medication  Patient Goals/Self-Care Activities . Over the next 90 days, patient will:  - check glucose daily (before breakfast), document, and provide at future appointments  Follow Up Plan: ***

## 2020-10-23 ENCOUNTER — Telehealth: Payer: Self-pay | Admitting: Family Medicine

## 2020-10-23 ENCOUNTER — Telehealth: Payer: Self-pay | Admitting: *Deleted

## 2020-10-23 DIAGNOSIS — Z96651 Presence of right artificial knee joint: Secondary | ICD-10-CM | POA: Diagnosis not present

## 2020-10-23 NOTE — Chronic Care Management (AMB) (Signed)
  Care Management   Note  10/23/2020 Name: Alexis Lloyd MRN: [id number] DOB: 04/17/1961  Alexis Lloyd is a 60 y.o. year old female who is a primary care patient of Brita Romp, Dionne Bucy, MD and is actively engaged with the care management team. I reached out to [name] by phone today to assist with re-scheduling a follow up visit with the Pharmacist.  Follow up plan: Unsuccessful telephone outreach attempt made. A HIPAA compliant phone message was left for the patient providing contact information and requesting a return call. The care management team will reach out to the patient again over the next 7 days. If patient returns call to provider office, please advise to call [place] at [phone].  [place] Management

## 2020-10-23 NOTE — Telephone Encounter (Signed)
Please advise. Lmtcb for appt today.

## 2020-10-23 NOTE — Telephone Encounter (Signed)
Erline Levine, can you help me reschedule this patient please?   Thank you!

## 2020-10-23 NOTE — Telephone Encounter (Signed)
lmtcb

## 2020-10-23 NOTE — Telephone Encounter (Signed)
Patient called to reschedule her appt. With the pharmacist.  She stated that she overslept and missed her appt. Yesterday.  Please call patient to reschedule.

## 2020-10-23 NOTE — Telephone Encounter (Signed)
Recommend miralax 1 cap full in water per day. Can titrate to 2 or 3 caps per day to achieve 1 soft BM daily. If no BM in many days, but still passing gas, can consider miralax clean out with half bottle in large gatorade and drink all over 1-2 hours.

## 2020-10-24 DIAGNOSIS — Z96651 Presence of right artificial knee joint: Secondary | ICD-10-CM | POA: Diagnosis not present

## 2020-10-25 NOTE — Telephone Encounter (Signed)
Patient advised.

## 2020-10-26 NOTE — Chronic Care Management (AMB) (Signed)
  Care Management   Note  10/26/2020 Name: Alexis Lloyd MRN: [id number] DOB: 06-14-1960  Alexis Lloyd is a 60 y.o. year old female who is a primary care patient of Brita Romp, Dionne Bucy, MD and is actively engaged with the care management team. I reached out to [name] by phone today to assist with re-scheduling a follow up visit with the Pharmacist  Follow up plan: Telephone appointment with care management team member scheduled for:10/30/2020  [place] [facility]

## 2020-10-29 ENCOUNTER — Telehealth: Payer: Self-pay

## 2020-10-29 NOTE — Progress Notes (Signed)
Left Voice  to confirmed patient telephone appointment on 10/30/2020 for CCM at 12:30 pm with [name] the Clinical pharmacist. Left message to have all medications, supplements, blood pressure and/or blood sugar logs available during appointment and to return call if need to reschedule.  [place] Pharmacist Assistant [phone]

## 2020-10-30 ENCOUNTER — Ambulatory Visit (INDEPENDENT_AMBULATORY_CARE_PROVIDER_SITE_OTHER): Payer: Medicare Other

## 2020-10-30 DIAGNOSIS — E785 Hyperlipidemia, unspecified: Secondary | ICD-10-CM

## 2020-10-30 DIAGNOSIS — E1142 Type 2 diabetes mellitus with diabetic polyneuropathy: Secondary | ICD-10-CM

## 2020-10-30 DIAGNOSIS — E1159 Type 2 diabetes mellitus with other circulatory complications: Secondary | ICD-10-CM

## 2020-10-30 DIAGNOSIS — Z96651 Presence of right artificial knee joint: Secondary | ICD-10-CM | POA: Diagnosis not present

## 2020-10-30 DIAGNOSIS — E1169 Type 2 diabetes mellitus with other specified complication: Secondary | ICD-10-CM

## 2020-10-30 DIAGNOSIS — I152 Hypertension secondary to endocrine disorders: Secondary | ICD-10-CM

## 2020-10-30 NOTE — Progress Notes (Signed)
Chronic Care Management Pharmacy Note  10/31/2020 Name:  Alexis Lloyd MRN:  [id number] DOB:  1960-09-22  Subjective: Alexis Lloyd is an 60 y.o. year old female who is a primary patient of Bacigalupo, Dionne Bucy, MD.  The CCM team was consulted for assistance with disease management and care coordination needs.    Engaged with patient by telephone for follow up visit in response to provider referral for pharmacy case management and/or care coordination services.   Consent to Services:  The patient was given information about Chronic Care Management services, agreed to services, and gave verbal consent prior to initiation of services.  Please see initial visit note for detailed documentation.   Patient Care Team: Virginia Crews, MD as PCP - General (Family Medicine) Imagene Riches, CNM as Midwife (Obstetrics) Germaine Pomfret, [facility] (Pharmacist) [name], NP as Nurse Practitioner (Endocrinology)  Recent office visits: 10/09/20: Patient presented to Dr. Brita Romp for viral URI  06/27/20: Video visit with Fenton Malling, PA-C for acute respiratory failure. Terconazole stopped.  05/07/20: Patient presented to Fenton Malling, PA-C for follow-up. Metformin stopped due to diarrhea and incontinence. Patient started on pioglitazone. Omeprazole changed to pantoprazole. Fluconazole started for yeast infection.   Recent consult visits: 08/13/20: Patient presented to Malissa Hippo, NP (Endocrinology) for follow-up.   Hospital visits: 1/2-06/15/20: Patient hospitalized for Covid infection. Patient treated with IV decadron, Zofran, and Remdesivir.   Objective:  Lab Results  Component Value Date   CREATININE 0.93 08/01/2020   BUN 24 08/01/2020   GFRNONAA 67 08/01/2020   GFRAA 77 08/01/2020   NA 142 08/01/2020   K 4.6 08/01/2020   CALCIUM 9.7 08/01/2020   CO2 23 08/01/2020    Lab Results  Component Value Date/Time   HGBA1C 7.3 (H) 08/01/2020 11:27 AM   HGBA1C  7.6 (H) 06/11/2020 03:07 AM   MICROALBUR 50 08/31/2017 10:06 AM   MICROALBUR 50 03/05/2016 02:04 PM    Last diabetic Eye exam:  Lab Results  Component Value Date/Time   HMDIABEYEEXA No Retinopathy 03/06/2020 12:00 AM    Last diabetic Foot exam: No results found for: HMDIABFOOTEX   Lab Results  Component Value Date   CHOL 233 (H) 08/01/2020   HDL 38 (L) 08/01/2020   LDLCALC 178 (H) 08/01/2020   TRIG 95 08/01/2020   CHOLHDL 7.1 (H) 09/16/2018    Hepatic Function Latest Ref Rng & Units 08/01/2020 06/15/2020 06/14/2020  Total Protein 6.0 - 8.5 g/dL 7.1 6.4(L) 6.8  Albumin 3.8 - 4.8 g/dL 4.3 2.8(L) 2.8(L)  AST 0 - 40 IU/L 8 11(L) 11(L)  ALT 0 - 32 IU/L 13 22 21   Alk Phosphatase 44 - 121 IU/L 64 45 46  Total Bilirubin 0.0 - 1.2 mg/dL 0.3 0.6 0.5    Lab Results  Component Value Date/Time   TSH 2.190 09/16/2018 11:12 AM   TSH 2.510 08/31/2017 10:50 AM    CBC Latest Ref Rng & Units 08/01/2020 06/10/2020 07/07/2019  WBC 3.4 - 10.8 x10E3/uL 6.5 10.7(H) 9.4  Hemoglobin 11.1 - 15.9 g/dL 11.0(L) 11.2(L) 11.9  Hematocrit 34.0 - 46.6 % 33.2(L) 33.9(L) 34.9  Platelets 150 - 450 x10E3/uL 243 308 324    No results found for: VD25OH  Clinical ASCVD: No  The 10-year ASCVD risk score Mikey Bussing DC Jr., et al., 2013) is: 20.9%   Values used to calculate the score:     Age: 61 years     Sex: Female     Is Non-Hispanic African American: Yes  Diabetic: Yes     Tobacco smoker: No     Systolic Blood Pressure: 309 mmHg     Is BP treated: Yes     HDL Cholesterol: 38 mg/dL     Total Cholesterol: 233 mg/dL    Depression screen [facility] 2/9 10/09/2020 08/01/2020 10/28/2019  Decreased Interest 0 0 0  Down, Depressed, Hopeless 0 0 0  PHQ - 2 Score 0 0 0  Altered sleeping 0 0 -  Tired, decreased energy 0 0 -  Change in appetite 0 0 -  Feeling bad or failure about yourself  0 0 -  Trouble concentrating 0 0 -  Moving slowly or fidgety/restless 0 0 -  Suicidal thoughts 0 0 -  PHQ-9 Score 0 0 -  Difficult  doing work/chores Not difficult at all Not difficult at all -  Some recent data might be hidden     Social History   Tobacco Use  Smoking Status Former Smoker  . Packs/day: 0.25  . Years: 21.00  . Pack years: 5.25  . Quit date: 06/08/1997  . Years since quitting: 23.4  Smokeless Tobacco Never Used   BP Readings from Last 3 Encounters:  10/09/20 123/78  09/19/20 (!) 121/92  08/01/20 116/69   Pulse Readings from Last 3 Encounters:  10/09/20 86  09/19/20 90  08/01/20 88   Wt Readings from Last 3 Encounters:  10/09/20 230 lb (104.3 kg)  09/19/20 230 lb (104.3 kg)  08/01/20 233 lb (105.7 kg)    Assessment/Interventions: Review of patient past medical history, allergies, medications, health status, including review of consultants reports, laboratory and other test data, was performed as part of comprehensive evaluation and provision of chronic care management services.   SDOH:  (Social Determinants of Health) assessments and interventions performed: Yes SDOH Interventions   Flowsheet Row Most Recent Value  SDOH Interventions   Financial Strain Interventions Other (Comment)  [PAP]      CCM Care Plan  Allergies  Allergen Reactions  . Allegra [Fexofenadine] Nausea And Vomiting  . Avelox [Moxifloxacin] Other (See Comments)    Hallucinations  . Bactrim [Sulfamethoxazole-Trimethoprim] Other (See Comments)    unknown  . Etodolac Nausea Only  . Fish-Derived Products Nausea And Vomiting    With "seafood"  . Penicillins Diarrhea and Nausea And Vomiting  . Shellfish Allergy Nausea Only  . Sulfa Antibiotics Other (See Comments)    unknown  . Macrobid [employer] Macro] Other (See Comments)    Constipation and globus hystericus.    Medications Reviewed Today    Reviewed by Virginia Crews, MD (Physician) on 10/09/20 at [phone]  Med List Status: <None>  Medication Order Taking? Sig Documenting Provider Last Dose Status Informant  acetaminophen (TYLENOL) 650  MG CR tablet [id number] Yes Take 650-1,300 mg by mouth every 8 (eight) hours as needed for pain. [provider] Taking Active   albuterol (VENTOLIN HFA) 108 (90 Base) MCG/ACT inhaler [id number] Yes INHALE 2 PUFFS BY MOUTH EVERY 6 HOURS AS NEEDED Mar Daring, PA-C Taking Active   ascorbic Acid (VITAMIN C) 500 MG CPCR [id number] Yes Take 500 mg by mouth daily. [provider] Taking Active   aspirin EC 81 MG tablet [id number] Yes Take 1 tablet (81 mg total) by mouth daily. Swallow whole. Mar Daring, PA-C Taking Active   Blood Glucose Monitoring [facility]l [facility] VERIO) w/Device Drucie Opitz [id number] Yes To check blood sugar once daily Mar Daring, [place] Taking Active   cetirizine (ZYRTEC) 10 MG tablet [id number]  Yes Take 1 tablet (10 mg total) by mouth daily. Margarita Rana, MD Taking Active Other  Cholecalciferol (VITAMIN D3) 50 MCG (2000 UT) TABS [id number] Yes Take 2,000 Units by mouth. [provider] Taking Active   Cyanocobalamin (VITAMIN B-12) 3000 MCG SUBL [id number] Yes Place 1 tablet under the tongue daily at 6 (six) AM. Gummies [provider] Taking Active   diclofenac Sodium (VOLTAREN) 1 % GEL [id number] Yes Apply 4 g topically 4 (four) times daily as needed. [provider] Taking Active   glipiZIDE (GLUCOTROL) 10 MG tablet [id number] Yes TAKE 1 TABLET BY MOUTH  TWICE DAILY BEFORE A MEAL Mar Daring, PA-C Taking Active   glucose blood (ONETOUCH VERIO) test strip [id number] Yes To check blood sugar once daily Mar Daring, PA-C Taking Active Other  Lancets [facility] ULTRASOFT) lancets [id number] Yes To check BS once daily Mar Daring, PA-C Taking Active   lisinopril-hydrochlorothiazide (ZESTORETIC) 20-12.5 MG tablet [id number] Yes TAKE 2 TABLETS BY MOUTH  DAILY Mar Daring, PA-C Taking Active Other  montelukast (SINGULAIR) 10 MG tablet [id number] Yes Take 1 tablet by mouth once daily Fenton Malling M,  PA-C Taking Active   pantoprazole (PROTONIX) 40 MG tablet [id number] Yes Take 1 tablet (40 mg total) by mouth daily. Mar Daring, PA-C Taking Active   rosuvastatin (CRESTOR) 10 MG tablet [id number] Yes Take 1 tablet (10 mg total) by mouth daily. Fenton Malling M, PA-C Taking Active   Semaglutide (RYBELSUS) 7 MG TABS [id number] Yes Take 7 mg by mouth daily before breakfast. [provider] Taking Active            Med Note Michaelle Birks, Cathe Mons A   Tue Jul 24, 2020  8:46 AM) Prescribed by [name], NP   spironolactone (ALDACTONE) 25 MG tablet [id number] Yes Take 1 tablet (25 mg total) by mouth daily. Mar Daring, PA-C Taking Active Other  SUMAtriptan (IMITREX) 50 MG tablet [id number] Yes Take 1 tablet (50 mg total) by mouth every 2 (two) hours as needed for migraine. No more than 232m in 24 hours BMar Daring [place] Active Other  verapamil (CALAN) 80 MG tablet [id number] TAKE 1 TABLET BY MOUTH  TWICE DAILY Bacigalupo, ADionne Bucy MD Taking Active   zinc gluconate 50 MG tablet [id number] Take 50 mg by mouth daily. [provider] Taking Active           Patient Active Problem List   Diagnosis Date Noted  . Pain due to onychomycosis of toenails of both feet 10/24/2019  . Benign skin lesion 10/24/2019  . Recurrent UTI 01/22/2018  . Recurrent vaginitis 01/22/2018  . History of Helicobacter pylori infection 11/05/2017  . Intrinsic eczema 11/05/2017  . CLE (columnar lined esophagus)   . Stomach irritation   . Gastric polyp   . Problems with swallowing and mastication   . Heartburn   . Benign neoplasm of transverse colon   . Internal hemorrhoids   . Diverticulosis of large intestine without diverticulitis   . Warts of foot 06/30/2017  . Achilles tendon contracture 01/28/2017  . Hallux rigidus of left foot 01/28/2017  . Hallux rigidus, right foot 01/28/2017  . Pes planus 01/28/2017  . Posterior tibial tendinitis of left lower  extremity 01/28/2017  . Posterior tibial tendinitis of right lower extremity 01/28/2017  . Asthma 09/03/2015  . Foot pain 07/16/2015  . DM type 2, uncontrolled, with neuropathy ([place] 07/13/2015  . Abnormal finding on mammography, microcalcification 04/25/2015  . Intraductal  carcinoma of breast 04/24/2015  . Airway hyperreactivity 11/18/2014  . Breast cyst 11/18/2014  . Diabetic neuropathy ([place]) 11/18/2014  . GERD (gastroesophageal reflux disease) 11/18/2014  . 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 Hypercholesteremia 11/18/2014  . Hypertension 11/18/2014  . Class 2 severe obesity due to excess calories with serious comorbidity and body mass index (BMI) of 39.0 to 39.9 in adult ([place]) 11/18/2014  . Arthritis, degenerative 11/18/2014  . Allergic rhinitis, seasonal 11/18/2014  . OSA (obstructive sleep apnea) 11/18/2014  . AC (acromioclavicular) arthritis 11/08/2014  . Personal history of breast cancer 10/04/2014  . Primary osteoarthritis of right knee 11/14/2013    Immunization History  Administered Date(s) Administered  . PFIZER Comirnaty(Gray Top)Covid-19 Tri-Sucrose Vaccine 08/15/2020  . PFIZER(Purple Top)SARS-COV-2 Vaccination 07/31/2019, 09/13/2019  . Pneumococcal Polysaccharide-23 06/22/2009, 08/06/2015  . Tdap 08/31/2017    Conditions to be addressed/monitored:  Hypertension, Hyperlipidemia, Diabetes, GERD, Asthma and Osteoarthritis  Care Plan : General Pharmacy (Adult)  Updates made by Germaine Pomfret, RPH since 10/31/2020 12:00 AM    Problem: Hypertension, Hyperlipidemia, Diabetes, GERD, Asthma and Osteoarthritis   Priority: High    Long-Range Goal: Patient-Specific Goal   Start Date: 07/23/2020  Expected End Date: 01/21/2021  This Visit's Progress: On track  Recent Progress: On track  Priority: High  Note:   Current Barriers:  . Unable to independently afford treatment regimen  Pharmacist Clinical Goal(s):  Marland Kitchen Over the next 90 days, patient will verbalize ability  to afford treatment regimen . maintain control of Diabetes as evidenced by A1c less than 8%  through collaboration with PharmD and provider.    Interventions: . 1:1 collaboration with Lavon Paganini, MD regarding development and update of comprehensive plan of care as evidenced by provider attestation and co-signature . Inter-disciplinary care team collaboration (see longitudinal plan of care) . Comprehensive medication review performed; medication list updated in electronic medical record  Hypertension (BP goal <140/90) -controlled -Current treatment: . Lisinopril-HCTZ 20-12.5 mg 2 tablets daily  . Spironolactone 25 mg daily  . Verapamil 80 mg daily  -Medications previously tried: Amlodipine, spironolactone   -Current home readings: NA -Current dietary habits: Limiting fried food. Eating more vegetables  -Current exercise habits: PT twice weekly -Denies hypotensive/hypertensive symptoms -Educated on Daily salt intake goal < 2300 mg; -Counseled to monitor BP at home if feeling symptomatic, document, and provide log at future appointments -Recommended to continue current medication  Hyperlipidemia: (LDL goal < 70) -uncontrolled -Current treatment: . Rosuvastatin 10 mg daily -Medications previously tried: Pravastatin, Lovastatin -Tolerating rosuvastatin well. Denies mylagias or other adverse effects.  Importance of limiting foods high in cholesterol; -Recommended rechecking lipid panel    Diabetes (A1c goal <8%) -Managed by [name], NP  -Controlled -Current medications: Marland Kitchen Glipizide 10 mg twice daily  . Rybelsus 7 mg daily  -Medications previously tried: Januvia, Metformin (diarrhea), Pioglitazone ([place])  -Current home glucose readings . fasting glucose: 140-170 fasting, 200s post-prandial  -Denies hypoglycemic/hyperglycemic symptoms Rybelsus patient assistance application refaxed to [name], NP for signature and submission. Patient provided with  samples of Rybelsus 3 mg # 60 tablets and will take two tablets (6 mg total) daily until patient assistance approved.   Asthma (Goal: control symptoms and prevent exacerbations) -controlled -Current treatment  . Ventolin HFA 108 mcg/act 2 puff every 6 hours as needed  . Montelukast 10 mg daily  -Medications previously tried: NA  -Pulmonary function testing: NA -Exacerbations requiring treatment in last 6 months: Yes, due to recent covid infection -Patient denies consistent use of  maintenance inhaler -Frequency of rescue inhaler use: twice weekly -Counseled on Proper inhaler technique; When to use rescue inhaler -Recommended to continue current medication  GERD (Goal: prevent symptoms of heartburn and reflux) -controlled -Current treatment  . Pantoprazole 40 mg daily  -Medications previously tried: NA  -Recommended to continue current medication  Osteoarthritis (Goal: minimize arthritis pain) -uncontrolled -Current treatment  . Hydrocodone 5-325 mg every 6 hours as needed  -Knee surgery 10/11/20. Patient currently still in moderate-severe pain and struggling with constipation due to opioid use -Counseled on risks and benefits of topical NSAID use -Counseled to avoid greater than 3000 mg of acetaminophen DAILY  -Recommended to continue current medication  Patient Goals/Self-Care Activities . Over the next 90 days, patient will:  - check glucose daily (before breakfast), document, and provide at future appointments  Follow Up Plan: Telephone follow up appointment with care management team member scheduled for:  01/25/2021 at 12:30 PM      Medication Assistance: Application for Rybelsus  medication assistance program. in process.  Anticipated assistance start date TBD.  See plan of care for additional detail.  Patient's preferred pharmacy is:  [facility] [address] [place] [address] Phone:  [phone] Fax: [place], [place], [address] [place], [address] [place] [address] Phone: [phone] Fax: [phone]  Uses pill box? Yes Pt endorses 100% compliance  We discussed: Current pharmacy is preferred with insurance plan and patient is satisfied with pharmacy services Patient decided to: Continue current medication management strategy  Care Plan and Follow Up Patient Decision:  Patient agrees to Care Plan and Follow-up.  Plan: Telephone follow up appointment with care management team member scheduled for:  01/25/2021 at 12:30 PM  [place] [phone]

## 2020-10-31 NOTE — Patient Instructions (Signed)
Visit Information It was great speaking with you today!  Please let me know if you have any questions about our visit.  Goals Addressed            This Visit's Progress   . Monitor and Manage My Blood Sugar-Diabetes Type 2   On track    Timeframe:  Long-Range Goal Priority:  High Start Date:    07/23/2020                         Expected End Date:   01/20/2021                    Follow Up Date 01/05/2021     - check blood sugar at prescribed times - check blood sugar if I feel it is too high or too low - take the blood sugar log to all doctor visits    Why is this important?    Checking your blood sugar at home helps to keep it from getting very high or very low.   Writing the results in a diary or log helps the doctor know how to care for you.   Your blood sugar log should have the time, date and the results.   Also, write down the amount of insulin or other medicine that you take.   Other information, like what you ate, exercise done and how you were feeling, will also be helpful.     Notes:        Patient Care Plan: General Pharmacy (Adult)    Problem Identified: Hypertension, Hyperlipidemia, Diabetes, GERD, Asthma and Osteoarthritis   Priority: High    Long-Range Goal: Patient-Specific Goal   Start Date: 07/23/2020  Expected End Date: 01/21/2021  This Visit's Progress: On track  Recent Progress: On track  Priority: High  Note:   Current Barriers:  . Unable to independently afford treatment regimen  Pharmacist Clinical Goal(s):  Marland Kitchen Over the next 90 days, patient will verbalize ability to afford treatment regimen . maintain control of Diabetes as evidenced by A1c less than 8%  through collaboration with PharmD and provider.    Interventions: . 1:1 collaboration with Lavon Paganini, MD regarding development and update of comprehensive plan of care as evidenced by provider attestation and co-signature . Inter-disciplinary care team collaboration (see  longitudinal plan of care) . Comprehensive medication review performed; medication list updated in electronic medical record  Hypertension (BP goal <140/90) -controlled -Current treatment: . Lisinopril-HCTZ 20-12.5 mg 2 tablets daily  . Spironolactone 25 mg daily  . Verapamil 80 mg daily  -Medications previously tried: Amlodipine, spironolactone   -Current home readings: NA -Current dietary habits: Limiting fried food. Eating more vegetables  -Current exercise habits: PT twice weekly -Denies hypotensive/hypertensive symptoms -Educated on Daily salt intake goal < 2300 mg; -Counseled to monitor BP at home if feeling symptomatic, document, and provide log at future appointments -Recommended to continue current medication  Hyperlipidemia: (LDL goal < 70) -uncontrolled -Current treatment: . Rosuvastatin 10 mg daily -Medications previously tried: Pravastatin, Lovastatin -Tolerating rosuvastatin well. Denies mylagias or other adverse effects.  Importance of limiting foods high in cholesterol; -Recommended rechecking lipid panel    Diabetes (A1c goal <8%) -Managed by [name], NP  -Controlled -Current medications: Marland Kitchen Glipizide 10 mg twice daily  . Rybelsus 7 mg daily  -Medications previously tried: Januvia, Metformin (diarrhea), Pioglitazone ([place])  -Current home glucose readings . fasting glucose: 140-170 fasting, 200s post-prandial  -Denies hypoglycemic/hyperglycemic symptoms  Rybelsus patient assistance application refaxed to [name], NP for signature and submission. Patient provided with samples of Rybelsus 3 mg # 60 tablets and will take two tablets (6 mg total) daily until patient assistance approved.   Asthma (Goal: control symptoms and prevent exacerbations) -controlled -Current treatment  . Ventolin HFA 108 mcg/act 2 puff every 6 hours as needed  . Montelukast 10 mg daily  -Medications previously tried: NA  -Pulmonary function testing:  NA -Exacerbations requiring treatment in last 6 months: Yes, due to recent covid infection -Patient denies consistent use of maintenance inhaler -Frequency of rescue inhaler use: twice weekly -Counseled on Proper inhaler technique; When to use rescue inhaler -Recommended to continue current medication  GERD (Goal: prevent symptoms of heartburn and reflux) -controlled -Current treatment  . Pantoprazole 40 mg daily  -Medications previously tried: NA  -Recommended to continue current medication  Osteoarthritis (Goal: minimize arthritis pain) -uncontrolled -Current treatment  . Hydrocodone 5-325 mg every 6 hours as needed  -Knee surgery 10/11/20. Patient currently still in moderate-severe pain and struggling with constipation due to opioid use -Counseled on risks and benefits of topical NSAID use -Counseled to avoid greater than 3000 mg of acetaminophen DAILY  -Recommended to continue current medication  Patient Goals/Self-Care Activities . Over the next 90 days, patient will:  - check glucose daily (before breakfast), document, and provide at future appointments  Follow Up Plan: Telephone follow up appointment with care management team member scheduled for:  01/25/2021 at 12:30 PM      Patient agreed to services and verbal consent obtained.   The patient verbalized understanding of instructions, educational materials, and care plan provided today and declined offer to receive copy of patient instructions, educational materials, and care plan.   [name], PharmD, [place] [phone]

## 2020-11-01 DIAGNOSIS — Z96651 Presence of right artificial knee joint: Secondary | ICD-10-CM | POA: Diagnosis not present

## 2020-11-07 DIAGNOSIS — Z96651 Presence of right artificial knee joint: Secondary | ICD-10-CM | POA: Diagnosis not present

## 2020-11-09 DIAGNOSIS — Z96651 Presence of right artificial knee joint: Secondary | ICD-10-CM | POA: Diagnosis not present

## 2020-11-11 ENCOUNTER — Other Ambulatory Visit: Payer: Self-pay | Admitting: Physician Assistant

## 2020-11-11 DIAGNOSIS — E1140 Type 2 diabetes mellitus with diabetic neuropathy, unspecified: Secondary | ICD-10-CM

## 2020-11-11 DIAGNOSIS — IMO0002 Reserved for concepts with insufficient information to code with codable children: Secondary | ICD-10-CM

## 2020-11-13 DIAGNOSIS — Z96651 Presence of right artificial knee joint: Secondary | ICD-10-CM | POA: Diagnosis not present

## 2020-11-15 DIAGNOSIS — Z96651 Presence of right artificial knee joint: Secondary | ICD-10-CM | POA: Diagnosis not present

## 2020-11-19 ENCOUNTER — Other Ambulatory Visit: Payer: Self-pay | Admitting: Physician Assistant

## 2020-11-19 DIAGNOSIS — I10 Essential (primary) hypertension: Secondary | ICD-10-CM | POA: Diagnosis not present

## 2020-11-19 DIAGNOSIS — E1165 Type 2 diabetes mellitus with hyperglycemia: Secondary | ICD-10-CM | POA: Diagnosis not present

## 2020-11-19 DIAGNOSIS — E1142 Type 2 diabetes mellitus with diabetic polyneuropathy: Secondary | ICD-10-CM | POA: Diagnosis not present

## 2020-11-19 DIAGNOSIS — E782 Mixed hyperlipidemia: Secondary | ICD-10-CM | POA: Diagnosis not present

## 2020-11-20 DIAGNOSIS — Z96651 Presence of right artificial knee joint: Secondary | ICD-10-CM | POA: Diagnosis not present

## 2020-11-23 DIAGNOSIS — Z96651 Presence of right artificial knee joint: Secondary | ICD-10-CM | POA: Diagnosis not present

## 2020-11-26 ENCOUNTER — Telehealth: Payer: Self-pay

## 2020-11-26 NOTE — Chronic Care Management (AMB) (Signed)
    Chronic Care Management Pharmacy Assistant   Name: Alexis Lloyd  MRN: [id number] DOB: [birth year]  Reason for Encounter: Patient Assistance Coordination  11/26/2020- [place] to check on the status of patient Rybelsus PAP applications, spoke with representative Caryl Pina and she informed me that patients application was approved on 11/20/2020 and will expire on 06/08/2021, medication will arrive to [name], NP office (Endocrinology) in 10-14 business days from approval date and patient has 2 refills left on application.  Called patient to inform, no answer, left message of approval information and to return call if there are any questions.   SIG: Pattricia Boss, [place] Pharmacist Assistant [phone]

## 2020-11-29 ENCOUNTER — Telehealth: Payer: Self-pay

## 2020-11-29 NOTE — Telephone Encounter (Signed)
Copied from [place] [phone]. Topic: General - Other >> Nov 29, 2020  9:29 AM Tessa Lerner A wrote: Reason for CRM: Patient has made contact for an update on the assistance needed for their Semaglutide (RYBELSUS) 7 MG TABS  prescription  Please contact to further advise when possible

## 2020-11-29 NOTE — Telephone Encounter (Signed)
LMTCB- will route this to clinical pharmacist assistant-[place], Glean Salvo, [facility]

## 2020-11-30 DIAGNOSIS — Z96651 Presence of right artificial knee joint: Secondary | ICD-10-CM | POA: Diagnosis not present

## 2020-12-05 ENCOUNTER — Telehealth: Payer: Self-pay

## 2020-12-05 NOTE — Telephone Encounter (Signed)
Can you try reaching back out to her again?

## 2020-12-05 NOTE — Telephone Encounter (Signed)
Copied from [place] [phone]. Topic: General - Other >> Dec 05, 2020  9:13 AM Tessa Lerner A wrote: Reason for CRM: Patient would like to be contacted about  (RYBELSUS) 7 MG TABS   Patient would like to know if there is additional medication available to them through the assistance program  Please contact to further advise

## 2020-12-06 ENCOUNTER — Telehealth: Payer: Self-pay

## 2020-12-06 NOTE — Progress Notes (Signed)
    Chronic Care Management Pharmacy Assistant   Name: Alexis Lloyd  MRN: [id number] DOB: 26-Aug-1960  Reason for Encounter: Medication Review/ Adherence Review.   Recent office visits:  None  Recent consult visits:  None   Hospital visits:  None in previous 6 months  Medications: Outpatient Encounter Medications as of 12/06/2020  Medication Sig Note   acetaminophen (TYLENOL) 650 MG CR tablet Take 650-1,300 mg by mouth every 8 (eight) hours as needed for pain. (Patient not taking: Reported on 10/30/2020)    albuterol (VENTOLIN HFA) 108 (90 Base) MCG/ACT inhaler INHALE 2 PUFFS BY MOUTH EVERY 6 HOURS AS NEEDED    ascorbic Acid (VITAMIN C) 500 MG CPCR Take 500 mg by mouth daily.    aspirin EC 81 MG tablet Take 1 tablet (81 mg total) by mouth daily. Swallow whole.    Blood Glucose Monitoring Suppl (ONETOUCH VERIO) w/Device KIT To check blood sugar once daily    cetirizine (ZYRTEC) 10 MG tablet Take 1 tablet (10 mg total) by mouth daily.    Cholecalciferol (VITAMIN D3) 50 MCG (2000 UT) TABS Take 2,000 Units by mouth.    Cyanocobalamin (VITAMIN B-12) 3000 MCG SUBL Place 1 tablet under the tongue daily at 6 (six) AM. Gummies    diclofenac Sodium (VOLTAREN) 1 % GEL Apply 4 g topically 4 (four) times daily as needed. (Patient not taking: Reported on 10/30/2020)    glipiZIDE (GLUCOTROL) 10 MG tablet TAKE 1 TABLET BY MOUTH  TWICE DAILY BEFORE A MEAL    glucose blood (ONETOUCH VERIO) test strip To check blood sugar once daily    HYDROcodone-acetaminophen (NORCO/VICODIN) 5-325 MG tablet Take 1 tablet by mouth every 6 (six) hours as needed.    Lancets (ONETOUCH ULTRASOFT) lancets To check BS once daily    lisinopril-hydrochlorothiazide (ZESTORETIC) 20-12.5 MG tablet Take 2 tablets by mouth once daily    montelukast (SINGULAIR) 10 MG tablet Take 1 tablet by mouth once daily    pantoprazole (PROTONIX) 40 MG tablet Take 1 tablet (40 mg total) by mouth daily.    rosuvastatin (CRESTOR) 10 MG tablet  Take 1 tablet (10 mg total) by mouth daily.    Semaglutide (RYBELSUS) 7 MG TABS Take 7 mg by mouth daily before breakfast. 07/24/2020: Prescribed by [name], NP    spironolactone (ALDACTONE) 25 MG tablet Take 1 tablet (25 mg total) by mouth daily.    SUMAtriptan (IMITREX) 50 MG tablet Take 1 tablet (50 mg total) by mouth every 2 (two) hours as needed for migraine. No more than 271m in 24 hours    verapamil (CALAN) 80 MG tablet TAKE 1 TABLET BY MOUTH  TWICE DAILY    zinc gluconate 50 MG tablet Take 50 mg by mouth daily.    No facility-administered encounter medications on file as of 12/06/2020.      Star Rating Drugs: Lisinopril-HCTZ 20-12.5 mg [facility] 10 mg [facility] on [facility] Ry[facility] 7 mg [facility]  Care Gaps: Per  Patient Chart, Patient is due for the following Care Gaps: Pneumococcal Vaccine, Shingrix, Mammogram,COVID-19 Vaccine.  BRouttPharmacist Assistant [phone]

## 2020-12-14 ENCOUNTER — Telehealth: Payer: Self-pay

## 2020-12-14 NOTE — Progress Notes (Signed)
  Chronic Care Management Pharmacy Assistant   Name: Alexis Lloyd  MRN: [id number] DOB: 02/19/1961  Reason for Encounter:Hypertension Disease State Call.   Recent office visits:  No recent Office Visit   Recent consult visits:  No recent Consult Visit  Hospital visits:  None in previous 6 months  Medications: Outpatient Encounter Medications as of 12/14/2020  Medication Sig Note   acetaminophen (TYLENOL) 650 MG CR tablet Take 650-1,300 mg by mouth every 8 (eight) hours as needed for pain. (Patient not taking: Reported on 10/30/2020)    albuterol (VENTOLIN HFA) 108 (90 Base) MCG/ACT inhaler INHALE 2 PUFFS BY MOUTH EVERY 6 HOURS AS NEEDED    ascorbic Acid (VITAMIN C) 500 MG CPCR Take 500 mg by mouth daily.    aspirin EC 81 MG tablet Take 1 tablet (81 mg total) by mouth daily. Swallow whole.    Blood Glucose Monitoring Suppl (ONETOUCH VERIO) w/Device KIT To check blood sugar once daily    cetirizine (ZYRTEC) 10 MG tablet Take 1 tablet (10 mg total) by mouth daily.    Cholecalciferol (VITAMIN D3) 50 MCG (2000 UT) TABS Take 2,000 Units by mouth.    Cyanocobalamin (VITAMIN B-12) 3000 MCG SUBL Place 1 tablet under the tongue daily at 6 (six) AM. Gummies    diclofenac Sodium (VOLTAREN) 1 % GEL Apply 4 g topically 4 (four) times daily as needed. (Patient not taking: Reported on 10/30/2020)    glipiZIDE (GLUCOTROL) 10 MG tablet TAKE 1 TABLET BY MOUTH  TWICE DAILY BEFORE A MEAL    glucose blood (ONETOUCH VERIO) test strip To check blood sugar once daily    HYDROcodone-acetaminophen (NORCO/VICODIN) 5-325 MG tablet Take 1 tablet by mouth every 6 (six) hours as needed.    Lancets (ONETOUCH ULTRASOFT) lancets To check BS once daily    lisinopril-hydrochlorothiazide (ZESTORETIC) 20-12.5 MG tablet Take 2 tablets by mouth once daily    montelukast (SINGULAIR) 10 MG tablet Take 1 tablet by mouth once daily    pantoprazole (PROTONIX) 40 MG tablet Take 1 tablet (40 mg total) by mouth daily.     rosuvastatin (CRESTOR) 10 MG tablet Take 1 tablet (10 mg total) by mouth daily.    Semaglutide (RYBELSUS) 7 MG TABS Take 7 mg by mouth daily before breakfast. 07/24/2020: Prescribed by Hillary Blackwood, NP    spironolactone (ALDACTONE) 25 MG tablet Take 1 tablet (25 mg total) by mouth daily.    SUMAtriptan (IMITREX) 50 MG tablet Take 1 tablet (50 mg total) by mouth every 2 (two) hours as needed for migraine. No more than 200mg in 24 hours    verapamil (CALAN) 80 MG tablet TAKE 1 TABLET BY MOUTH  TWICE DAILY    zinc gluconate 50 MG tablet Take 50 mg by mouth daily.    No facility-administered encounter medications on file as of 12/14/2020.    Care Gaps: Per  Patient Chart, Patient is due for the following Care Gaps: Pneumococcal Vaccine, Shingrix, Mammogram,COVID-19 Vaccine.  Star Rating Drugs: Lisinopril-HCTZ 20-12.5 mg last filled on 11/24/2020 for 30 day supply at [facility]. Glipizide 10 mg last filled on 12/11/2020 for 90 day supply at [facility]. Rosuvastatin 10 mg last filled on 10/23/2020 for 90 day supply at [facility]. Rybelsus 7 mg last filled on 08/16/2020 for 90 day supply at [facility].  Reviewed chart prior to disease state call. Spoke with patient regarding BP  Recent Office Vitals: BP Readings from Last 3 Encounters:  10/09/20 123/78  09/19/20 (!) 121/92    08/01/20 116/69   Pulse Readings from Last 3 Encounters:  10/09/20 86  09/19/20 90  08/01/20 88    Wt Readings from Last 3 Encounters:  10/09/20 230 lb (104.3 kg)  09/19/20 230 lb (104.3 kg)  08/01/20 233 lb (105.7 kg)     Kidney Function Lab Results  Component Value Date/Time   CREATININE 0.93 08/01/2020 11:27 AM   CREATININE 0.62 06/15/2020 02:48 AM   CREATININE 0.75 04/14/2014 09:25 AM   CREATININE 0.71 11/02/2013 10:07 AM   GFRNONAA 67 08/01/2020 11:27 AM   GFRNONAA >60 06/15/2020 02:48 AM   GFRNONAA >60 04/14/2014 09:25 AM   GFRNONAA >60 11/02/2013 10:07 AM   GFRAA 77 08/01/2020  11:27 AM   GFRAA >60 04/14/2014 09:25 AM   GFRAA >60 11/02/2013 10:07 AM    BMP Latest Ref Rng & Units 08/01/2020 06/15/2020 06/14/2020  Glucose 65 - 99 mg/dL 65 173(H) 193(H)  BUN 8 - 27 mg/dL _0 Creatinine 0.57 - 1.00 mg/dL 0.93 0.62 0.56  BUN/Creat Ratio 12 - 28 26 - -  Sodium 134 - 144 mmol/L 142 137 135  Potassium 3.5 - 5.2 mmol/L 4.6 4.0 4.2  Chloride 96 - 106 mmol/L 102 104 101  CO2 20 - 29 mmol/L _1 Calcium 8.7 - 10.3 mg/dL 9.7 8.4(L) 8.7(L)    Current antihypertensive regimen:  Lisinopril-HCTZ 20-12.5 mg 2 tablets daily Spironolactone 25 mg daily Verapamil 80 mg daily What recent interventions/DTPs have been made by any provider to improve Blood Pressure control since last CPP Visit: None ID  Any recent hospitalizations or ED visits since last visit with CPP? No   I have attempted without success to contact this patient by phone three times to do her Hypertension Disease State call.  Mailbox full 07/08,07/11,07/12  Adherence Review: Is the patient currently on ACE/ARB medication? Yes Does the patient have >5 day gap between last estimated fill dates? No  Patient is schedule for a telephone follow up on 01/25/2021 at 12:30 pm with the clinical pharmacist.  [place] Clinical Pharmacist Assistant [phone]

## 2020-12-21 ENCOUNTER — Other Ambulatory Visit: Payer: Self-pay | Admitting: Physician Assistant

## 2020-12-21 DIAGNOSIS — I10 Essential (primary) hypertension: Secondary | ICD-10-CM

## 2020-12-21 DIAGNOSIS — Z96651 Presence of right artificial knee joint: Secondary | ICD-10-CM | POA: Diagnosis not present

## 2020-12-27 ENCOUNTER — Other Ambulatory Visit: Payer: Self-pay | Admitting: Family Medicine

## 2020-12-27 ENCOUNTER — Other Ambulatory Visit: Payer: Self-pay | Admitting: Physician Assistant

## 2020-12-27 DIAGNOSIS — J40 Bronchitis, not specified as acute or chronic: Secondary | ICD-10-CM

## 2020-12-27 DIAGNOSIS — I10 Essential (primary) hypertension: Secondary | ICD-10-CM

## 2020-12-28 ENCOUNTER — Other Ambulatory Visit: Payer: Self-pay | Admitting: Physician Assistant

## 2020-12-28 DIAGNOSIS — J4520 Mild intermittent asthma, uncomplicated: Secondary | ICD-10-CM

## 2020-12-28 DIAGNOSIS — Z96651 Presence of right artificial knee joint: Secondary | ICD-10-CM | POA: Diagnosis not present

## 2021-01-23 DIAGNOSIS — Z96651 Presence of right artificial knee joint: Secondary | ICD-10-CM | POA: Diagnosis not present

## 2021-01-24 ENCOUNTER — Telehealth: Payer: Self-pay

## 2021-01-24 NOTE — Progress Notes (Signed)
Left Voice  message to confirmed patient telephone appointment on 01/25/2021 for CCM at 12:30 pm with [name] the Clinical pharmacist.Left message to have all medications, supplements, blood pressure and blood sugar logs available during appointment and to return call if need to reschedule.  [place] Pharmacist Assistant [phone]

## 2021-01-25 ENCOUNTER — Ambulatory Visit (INDEPENDENT_AMBULATORY_CARE_PROVIDER_SITE_OTHER): Payer: Medicare Other

## 2021-01-25 DIAGNOSIS — M1711 Unilateral primary osteoarthritis, right knee: Secondary | ICD-10-CM

## 2021-01-25 DIAGNOSIS — E1142 Type 2 diabetes mellitus with diabetic polyneuropathy: Secondary | ICD-10-CM

## 2021-01-25 DIAGNOSIS — E1159 Type 2 diabetes mellitus with other circulatory complications: Secondary | ICD-10-CM | POA: Diagnosis not present

## 2021-01-25 DIAGNOSIS — I152 Hypertension secondary to endocrine disorders: Secondary | ICD-10-CM | POA: Diagnosis not present

## 2021-01-25 NOTE — Progress Notes (Signed)
Chronic Care Management Pharmacy Note  01/25/2021 Name:  Alexis Lloyd MRN:  [id number] DOB:  03-03-1961  Subjective: Alexis Lloyd is an 60 y.o. year old female who is a primary patient of Bacigalupo, Dionne Bucy, MD.  The CCM team was consulted for assistance with disease management and care coordination needs.    Engaged with patient by telephone for follow up visit in response to provider referral for pharmacy case management and/or care coordination services.   Consent to Services:  The patient was given information about Chronic Care Management services, agreed to services, and gave verbal consent prior to initiation of services.  Please see initial visit note for detailed documentation.   Patient Care Team: Virginia Crews, MD as PCP - General (Family Medicine) Imagene Riches, CNM as Midwife (Obstetrics) Germaine Pomfret, [facility] (Pharmacist) [name], NP as Nurse Practitioner (Endocrinology)  Recent office visits: 10/09/20: Patient presented to Dr. Brita Romp for viral URI  06/27/20: Video visit with Fenton Malling, PA-C for acute respiratory failure. Terconazole stopped.  05/07/20: Patient presented to Fenton Malling, PA-C for follow-up. Metformin stopped due to diarrhea and incontinence. Patient started on pioglitazone. Omeprazole changed to pantoprazole. Fluconazole started for yeast infection.   Recent consult visits: 11/19/20: Patient presented to Malissa Hippo, NP (Endocrinology) for follow-up.  08/13/20: Patient presented to Malissa Hippo, NP (Endocrinology) for follow-up.   Hospital visits: 1/2-06/15/20: Patient hospitalized for Covid infection. Patient treated with IV decadron, Zofran, and Remdesivir.   Objective:  Lab Results  Component Value Date   CREATININE 0.93 08/01/2020   BUN 24 08/01/2020   GFRNONAA 67 08/01/2020   GFRAA 77 08/01/2020   NA 142 08/01/2020   K 4.6 08/01/2020   CALCIUM 9.7 08/01/2020   CO2 23 08/01/2020    Lab  Results  Component Value Date/Time   HGBA1C 7.3 (H) 08/01/2020 11:27 AM   HGBA1C 7.6 (H) 06/11/2020 03:07 AM   MICROALBUR 50 08/31/2017 10:06 AM   MICROALBUR 50 03/05/2016 02:04 PM    Last diabetic Eye exam:  Lab Results  Component Value Date/Time   HMDIABEYEEXA No Retinopathy 03/06/2020 12:00 AM    Last diabetic Foot exam: No results found for: HMDIABFOOTEX   Lab Results  Component Value Date   CHOL 233 (H) 08/01/2020   HDL 38 (L) 08/01/2020   LDLCALC 178 (H) 08/01/2020   TRIG 95 08/01/2020   CHOLHDL 7.1 (H) 09/16/2018    Hepatic Function Latest Ref Rng & Units 08/01/2020 06/15/2020 06/14/2020  Total Protein 6.0 - 8.5 g/dL 7.1 6.4(L) 6.8  Albumin 3.8 - 4.8 g/dL 4.3 2.8(L) 2.8(L)  AST 0 - 40 IU/L 8 11(L) 11(L)  ALT 0 - 32 IU/L _0 Alk Phosphatase 44 - 121 IU/L 64 45 46  Total Bilirubin 0.0 - 1.2 mg/dL 0.3 0.6 0.5    Lab Results  Component Value Date/Time   TSH 2.190 09/16/2018 11:12 AM   TSH 2.510 08/31/2017 10:50 AM    CBC Latest Ref Rng & Units 08/01/2020 06/10/2020 07/07/2019  WBC 3.4 - 10.8 x10E3/uL 6.5 10.7(H) 9.4  Hemoglobin 11.1 - 15.9 g/dL 11.0(L) 11.2(L) 11.9  Hematocrit 34.0 - 46.6 % 33.2(L) 33.9(L) 34.9  Platelets 150 - 450 x10E3/uL 243 308 324    No results found for: VD25OH  Clinical ASCVD: No  The 10-year ASCVD risk score Mikey Bussing DC Jr., et al., 2013) is: 16.6%   Values used to calculate the score:     Age: 74 years  Sex: Female     Is Non-Hispanic African American: Yes     Diabetic: Yes     Tobacco smoker: No     Systolic Blood Pressure: 578 mmHg     Is BP treated: Yes     HDL Cholesterol: 38 mg/dL     Total Cholesterol: 233 mg/dL    Depression screen [facility] 2/9 10/09/2020 08/01/2020 10/28/2019  Decreased Interest 0 0 0  Down, Depressed, Hopeless 0 0 0  PHQ - 2 Score 0 0 0  Altered sleeping 0 0 -  Tired, decreased energy 0 0 -  Change in appetite 0 0 -  Feeling bad or failure about yourself  0 0 -  Trouble concentrating 0 0 -  Moving slowly  or fidgety/restless 0 0 -  Suicidal thoughts 0 0 -  PHQ-9 Score 0 0 -  Difficult doing work/chores Not difficult at all Not difficult at all -  Some recent data might be hidden     Social History   Tobacco Use  Smoking Status Former   Packs/day: 0.25   Years: 21.00   Pack years: 5.25   Types: Cigarettes   Quit date: 06/08/1997   Years since quitting: 23.6  Smokeless Tobacco Never   BP Readings from Last 3 Encounters:  10/09/20 123/78  09/19/20 (!) 121/92  08/01/20 116/69   Pulse Readings from Last 3 Encounters:  10/09/20 86  09/19/20 90  08/01/20 88   Wt Readings from Last 3 Encounters:  10/09/20 230 lb (104.3 kg)  09/19/20 230 lb (104.3 kg)  08/01/20 233 lb (105.7 kg)    Assessment/Interventions: Review of patient past medical history, allergies, medications, health status, including review of consultants reports, laboratory and other test data, was performed as part of comprehensive evaluation and provision of chronic care management services.   SDOH:  (Social Determinants of Health) assessments and interventions performed: Yes SDOH Interventions    Flowsheet Row Most Recent Value  SDOH Interventions   Financial Strain Interventions Intervention Not Indicated        CCM Care Plan  Allergies  Allergen Reactions   Allegra [Fexofenadine] Nausea And Vomiting   Avelox [Moxifloxacin] Other (See Comments)    Hallucinations   Bactrim [Sulfamethoxazole-Trimethoprim] Other (See Comments)    unknown   Etodolac Nausea Only   Fish-Derived Products Nausea And Vomiting    With "seafood"   Penicillins Diarrhea and Nausea And Vomiting   Shellfish Allergy Nausea Only   Sulfa Antibiotics Other (See Comments)    unknown   Macrobid [Nitrofurantoin Monohyd Macro] Other (See Comments)    Constipation and globus hystericus.    Medications Reviewed Today     Reviewed by Virginia Crews, MD (Physician) on 10/09/20 at [phone]  Med List Status: <None>   Medication  Order Taking? Sig Documenting Provider Last Dose Status Informant  acetaminophen (TYLENOL) 650 MG CR tablet [id number] Yes Take 650-1,300 mg by mouth every 8 (eight) hours as needed for pain. [provider] Taking Active   albuterol (VENTOLIN HFA) 108 (90 Base) MCG/ACT inhaler [id number] Yes INHALE 2 PUFFS BY MOUTH EVERY 6 HOURS AS NEEDED Mar Daring, PA-C Taking Active   ascorbic Acid (VITAMIN C) 500 MG CPCR [id number] Yes Take 500 mg by mouth daily. [provider] Taking Active   aspirin EC 81 MG tablet [id number] Yes Take 1 tablet (81 mg total) by mouth daily. Swallow whole. Mar Daring, PA-C Taking Active   Blood Glucose Monitoring Suppl [facility] VERIO) w/Device Drucie Opitz [id number]  Yes To check blood sugar once daily Burnette, Jennifer M, PA-C Taking Active   cetirizine (ZYRTEC) 10 MG tablet [id number] Yes Take 1 tablet (10 mg total) by mouth daily. Maloney, Nancy, MD Taking Active Other  Cholecalciferol (VITAMIN D3) 50 MCG (2000 UT) TABS [id number] Yes Take 2,000 Units by mouth. [provider] Taking Active   Cyanocobalamin (VITAMIN B-12) 3000 MCG SUBL [id number] Yes Place 1 tablet under the tongue daily at 6 (six) AM. Gummies [provider] Taking Active   diclofenac Sodium (VOLTAREN) 1 % GEL [id number] Yes Apply 4 g topically 4 (four) times daily as needed. [provider] Taking Active   glipiZIDE (GLUCOTROL) 10 MG tablet [id number] Yes TAKE 1 TABLET BY MOUTH  TWICE DAILY BEFORE A MEAL Burnette, Jennifer M, PA-C Taking Active   glucose blood (ONETOUCH VERIO) test strip [id number] Yes To check blood sugar once daily Burnette, Jennifer M, PA-C Taking Active Other  Lancets (ONETOUCH ULTRASOFT) lancets [id number] Yes To check BS once daily Burnette, Jennifer M, PA-C Taking Active   lisinopril-hydrochlorothiazide (ZESTORETIC) 20-12.5 MG tablet [id number] Yes TAKE 2 TABLETS BY MOUTH  DAILY Burnette, Jennifer M, PA-C Taking Active Other  montelukast  (SINGULAIR) 10 MG tablet [id number] Yes Take 1 tablet by mouth once daily Burnette, Jennifer M, PA-C Taking Active   pantoprazole (PROTONIX) 40 MG tablet [id number] Yes Take 1 tablet (40 mg total) by mouth daily. Burnette, Jennifer M, PA-C Taking Active   rosuvastatin (CRESTOR) 10 MG tablet [id number] Yes Take 1 tablet (10 mg total) by mouth daily. Burnette, Jennifer M, PA-C Taking Active   Semaglutide (RYBELSUS) 7 MG TABS [id number] Yes Take 7 mg by mouth daily before breakfast. [provider] Taking Active            Med Note (FLEURY, ALEXANDRE A   Tue Jul 24, 2020  8:46 AM) Prescribed by Hillary Blackwood, NP   spironolactone (ALDACTONE) 25 MG tablet [id number] Yes Take 1 tablet (25 mg total) by mouth daily. Burnette, Jennifer M, PA-C Taking Active Other  SUMAtriptan (IMITREX) 50 MG tablet [id number] Yes Take 1 tablet (50 mg total) by mouth every 2 (two) hours as needed for migraine. No more than 200mg in 24 hours Burnette, Jennifer M, PA-C Taking Active Other  verapamil (CALAN) 80 MG tablet [id number] Yes TAKE 1 TABLET BY MOUTH  TWICE DAILY Bacigalupo, Angela M, MD Taking Active   zinc gluconate 50 MG tablet [id number] Yes Take 50 mg by mouth daily. [provider] Taking Active             Patient Active Problem List   Diagnosis Date Noted   Pain due to onychomycosis of toenails of both feet 10/24/2019   Benign skin lesion 10/24/2019   Recurrent UTI 01/22/2018   Recurrent vaginitis 01/22/2018   History of Helicobacter pylori infection 11/05/2017   Intrinsic eczema 11/05/2017   CLE (columnar lined esophagus)    Stomach irritation    Gastric polyp    Problems with swallowing and mastication    Heartburn    Benign neoplasm of transverse colon    Internal hemorrhoids    Diverticulosis of large intestine without diverticulitis    Warts of foot 06/30/2017   Achilles tendon contracture 01/28/2017   Hallux rigidus of left foot 01/28/2017   Hallux rigidus, right foot  01/28/2017   Pes planus 01/28/2017   Posterior tibial tendinitis of left lower extremity 01/28/2017   Posterior tibial tendinitis of right lower extremity 01/28/2017   Asthma 09/03/2015   Foot   pain 07/16/2015   DM type 2, uncontrolled, with neuropathy ([place]) 07/13/2015   Abnormal finding on mammography, microcalcification 04/25/2015   Intraductal carcinoma of breast 04/24/2015   Airway hyperreactivity 11/18/2014   Breast cyst 11/18/2014   Diabetic neuropathy ([place]) 11/18/2014   GERD (gastroesophageal reflux disease) 11/18/2014   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   Hypercholesteremia 11/18/2014   Hypertension 11/18/2014   Class 2 severe obesity due to excess calories with serious comorbidity and body mass index (BMI) of 39.0 to 39.9 in adult ([place]) 11/18/2014   Arthritis, degenerative 11/18/2014   Allergic rhinitis, seasonal 11/18/2014   OSA (obstructive sleep apnea) 11/18/2014   AC (acromioclavicular) arthritis 11/08/2014   Personal history of breast cancer 10/04/2014   Primary osteoarthritis of right knee 11/14/2013    Immunization History  Administered Date(s) Administered   PFIZER Comirnaty(Gray Top)Covid-19 Tri-Sucrose Vaccine 08/15/2020   PFIZER(Purple Top)SARS-COV-2 Vaccination 07/31/2019, 09/13/2019   Pneumococcal Polysaccharide-23 06/22/2009, 08/06/2015   Tdap 08/31/2017    Conditions to be addressed/monitored:  Hypertension, Hyperlipidemia, Diabetes, GERD, Asthma and Osteoarthritis  Care Plan : General Pharmacy (Adult)  Updates made by Germaine Pomfret, [place] since 01/25/2021 12:00 AM     Problem: Hypertension, Hyperlipidemia, Diabetes, GERD, Asthma and Osteoarthritis   Priority: High     Long-Range Goal: Patient-Specific Goal   Start Date: 07/23/2020  Expected End Date: 01/25/2022  This Visit's Progress: On track  Recent Progress: On track  Priority: High  Note:   Current Barriers:  Unable to independently afford treatment regimen  Pharmacist Clinical Goal(s):   Over the next 90 days, patient will verbalize ability to afford treatment regimen maintain control of Diabetes as evidenced by A1c less than 8%  through collaboration with PharmD and provider.    Interventions: 1:1 collaboration with Lavon Paganini, MD regarding development and update of comprehensive plan of care as evidenced by provider attestation and co-signature Inter-disciplinary care team collaboration (see longitudinal plan of care) Comprehensive medication review performed; medication list updated in electronic medical record  Hypertension (BP goal <140/90) -controlled -Current treatment: Lisinopril-HCTZ 20-12.5 mg 2 tablets daily  Spironolactone 25 mg daily  Verapamil 80 mg daily  -Medications previously tried: Amlodipine, spironolactone   -Current home readings: NA -Current dietary habits: Limiting fried food. Eating more vegetables  -Current exercise habits: PT weekly -Denies hypotensive/hypertensive symptoms -Educated on Daily salt intake goal < 2300 mg; -Counseled to monitor BP at home if feeling symptomatic, document, and provide log at future appointments -Recommended to continue current medication  Hyperlipidemia: (LDL goal < 70) -uncontrolled -Current treatment: Rosuvastatin 10 mg daily -Medications previously tried: Pravastatin, Lovastatin Importance of limiting foods high in cholesterol; -Recommended rechecking lipid panel    Diabetes (A1c goal <8%) -Managed by [name], NP  -Uncontrolled -Current medications: Glipizide 10 mg twice daily  Rybelsus 7 mg daily  -Medications previously tried: Januvia, Metformin (diarrhea), Pioglitazone ([place])  -Current home glucose readings fasting glucose: 200-210s -Denies hypoglycemic/hyperglycemic symptoms -Patient takes glipizide at bedtime with snack. Counseled to adjust timing to dinner and avoiding late night snack. Patient sometimes feels she has to eat to avoid hypoglycemia due to her glipizide.   -Could consider increasing Rybelsus to 14 mg daily and decreasing glipizide 5 mg twice daily to minimize risk of hypoglycemia.   Asthma (Goal: control symptoms and prevent exacerbations) -controlled -Current treatment  Ventolin HFA 108 mcg/act 2 puff every 6 hours as needed  Montelukast 10 mg daily  -Medications previously tried: NA  -Pulmonary function testing: NA -Exacerbations requiring treatment in last 6 months:  Yes, due to recent covid infection -Patient denies consistent use of maintenance inhaler -Frequency of rescue inhaler use: twice weekly -Counseled on Proper inhaler technique; When to use rescue inhaler -Recommended to continue current medication  GERD (Goal: prevent symptoms of heartburn and reflux) -controlled -Current treatment  Pantoprazole 40 mg daily  -Medications previously tried: NA  -Recommended to continue current medication  Osteoarthritis (Goal: minimize arthritis pain) -Controlled -Current treatment  Acetaminophen CR 650 mg every 8 hours as needed Ibuprofen 200 mg 2 tablets every 6 hours as needed  -Knee surgery 10/11/20. Patient reports pain improving -Counseled on risks and benefits of NSAID use -Counseled to avoid greater than 3000 mg of acetaminophen DAILY  -Recommended to continue current medication  Patient Goals/Self-Care Activities Over the next 90 days, patient will:  - check glucose daily (before breakfast), document, and provide at future appointments  Follow Up Plan: Telephone follow up appointment with care management team member scheduled for:  04/26/2021 at 1:00 PM       Medication Assistance:  Rybelsus obtained through [employer] medication assistance program.  Enrollment ends Dec 2022  Patient's preferred pharmacy is:  [facility] [address] ([address]), [place]) [place] [address] Phone: [phone] Fax: [phone]  [facility]  ([place], [place] [address] Phone: [phone] Fax: [phone]  Uses pill box? Yes Pt endorses 100% compliance  We discussed: Current pharmacy is preferred with insurance plan and patient is satisfied with pharmacy services Patient decided to: Continue current medication management strategy  Care Plan and Follow Up Patient Decision:  Patient agrees to Care Plan and Follow-up.  Plan: Telephone follow up appointment with care management team member scheduled for:  04/26/2021 at 1:00 PM  [place] [phone]

## 2021-01-25 NOTE — Patient Instructions (Signed)
Visit Information It was great speaking with you today!  Please let me know if you have any questions about our visit.   Goals Addressed             This Visit's Progress    Monitor and Manage My Blood Sugar-Diabetes Type 2   On track    Timeframe:  Long-Range Goal Priority:  High Start Date:    07/23/2020                         Expected End Date:   01/20/2022                    Follow Up Date 04/07/2021     - check blood sugar at prescribed times - check blood sugar if I feel it is too high or too low - take the blood sugar log to all doctor visits    Why is this important?   Checking your blood sugar at home helps to keep it from getting very high or very low.  Writing the results in a diary or log helps the doctor know how to care for you.  Your blood sugar log should have the time, date and the results.  Also, write down the amount of insulin or other medicine that you take.  Other information, like what you ate, exercise done and how you were feeling, will also be helpful.     Notes:         Patient Care Plan: General Pharmacy (Adult)     Problem Identified: Hypertension, Hyperlipidemia, Diabetes, GERD, Asthma and Osteoarthritis   Priority: High     Long-Range Goal: Patient-Specific Goal   Start Date: 07/23/2020  Expected End Date: 01/25/2022  This Visit's Progress: On track  Recent Progress: On track  Priority: High  Note:   Current Barriers:  Unable to independently afford treatment regimen  Pharmacist Clinical Goal(s):  Over the next 90 days, patient will verbalize ability to afford treatment regimen maintain control of Diabetes as evidenced by A1c less than 8%  through collaboration with PharmD and provider.    Interventions: 1:1 collaboration with Lavon Paganini, MD regarding development and update of comprehensive plan of care as evidenced by provider attestation and co-signature Inter-disciplinary care team collaboration (see longitudinal plan of  care) Comprehensive medication review performed; medication list updated in electronic medical record  Hypertension (BP goal <140/90) -controlled -Current treatment: Lisinopril-HCTZ 20-12.5 mg 2 tablets daily  Spironolactone 25 mg daily  Verapamil 80 mg daily  -Medications previously tried: Amlodipine, spironolactone   -Current home readings: NA -Current dietary habits: Limiting fried food. Eating more vegetables  -Current exercise habits: PT weekly -Denies hypotensive/hypertensive symptoms -Educated on Daily salt intake goal < 2300 mg; -Counseled to monitor BP at home if feeling symptomatic, document, and provide log at future appointments -Recommended to continue current medication  Hyperlipidemia: (LDL goal < 70) -uncontrolled -Current treatment: Rosuvastatin 10 mg daily -Medications previously tried: Pravastatin, Lovastatin Importance of limiting foods high in cholesterol; -Recommended rechecking lipid panel    Diabetes (A1c goal <8%) -Managed by [name], NP  -Uncontrolled -Current medications: Glipizide 10 mg twice daily  Rybelsus 7 mg daily  -Medications previously tried: Januvia, Metformin (diarrhea), Pioglitazone ([place])  -Current home glucose readings fasting glucose: 200-210s -Denies hypoglycemic/hyperglycemic symptoms -Patient takes glipizide at bedtime with snack. Counseled to adjust timing to dinner and avoiding late night snack. Patient sometimes feels she has to eat to avoid hypoglycemia  due to her glipizide.  -Could consider increasing Rybelsus to 14 mg daily and decreasing glipizide 5 mg twice daily to minimize risk of hypoglycemia.   Asthma (Goal: control symptoms and prevent exacerbations) -controlled -Current treatment  Ventolin HFA 108 mcg/act 2 puff every 6 hours as needed  Montelukast 10 mg daily  -Medications previously tried: NA  -Pulmonary function testing: NA -Exacerbations requiring treatment in last 6 months: Yes, due to recent  covid infection -Patient denies consistent use of maintenance inhaler -Frequency of rescue inhaler use: twice weekly -Counseled on Proper inhaler technique; When to use rescue inhaler -Recommended to continue current medication  GERD (Goal: prevent symptoms of heartburn and reflux) -controlled -Current treatment  Pantoprazole 40 mg daily  -Medications previously tried: NA  -Recommended to continue current medication  Osteoarthritis (Goal: minimize arthritis pain) -Controlled -Current treatment  Acetaminophen CR 650 mg every 8 hours as needed Ibuprofen 200 mg 2 tablets every 6 hours as needed  -Knee surgery 10/11/20. Patient reports pain improving -Counseled on risks and benefits of NSAID use -Counseled to avoid greater than 3000 mg of acetaminophen DAILY  -Recommended to continue current medication  Patient Goals/Self-Care Activities Over the next 90 days, patient will:  - check glucose daily (before breakfast), document, and provide at future appointments  Follow Up Plan: Telephone follow up appointment with care management team member scheduled for:  04/26/2021 at 1:00 PM      Patient agreed to services and verbal consent obtained.   The patient verbalized understanding of instructions, educational materials, and care plan provided today and declined offer to receive copy of patient instructions, educational materials, and care plan.   [name], PharmD, [place], [place] [phone]

## 2021-01-29 ENCOUNTER — Ambulatory Visit: Payer: Medicare Other

## 2021-01-30 ENCOUNTER — Other Ambulatory Visit: Payer: Self-pay | Admitting: Family Medicine

## 2021-01-30 ENCOUNTER — Other Ambulatory Visit: Payer: Self-pay | Admitting: Physician Assistant

## 2021-01-30 DIAGNOSIS — I10 Essential (primary) hypertension: Secondary | ICD-10-CM

## 2021-01-30 DIAGNOSIS — J40 Bronchitis, not specified as acute or chronic: Secondary | ICD-10-CM

## 2021-01-30 NOTE — Telephone Encounter (Signed)
Requested medication (s) are due for refill today:  yes  Requested medication (s) are on the active medication list: yes  Last refill:  12/28/2020  Future visit scheduled: no  Notes to clinic:  overdue for labs and follow up  Vm left for patient    Requested Prescriptions  Pending Prescriptions Disp Refills   lisinopril-hydrochlorothiazide (ZESTORETIC) 20-12.5 MG tablet [Pharmacy Med Name: Lisinopril-hydroCHLOROthiazide 20-12.5 MG Oral Tablet] 60 tablet 0    Sig: TAKE 2 TABLETS BY MOUTH ONCE DAILY . APPOINTMENT REQUIRED FOR FUTURE REFILLS     Cardiovascular:  ACEI + Diuretic Combos Failed - 01/30/2021  1:09 PM      Failed - Na in normal range and within 180 days    Sodium  Date Value Ref Range Status  08/01/2020 142 134 - 144 mmol/L Final  04/14/2014 141 136 - 145 mmol/L Final          Failed - K in normal range and within 180 days    Potassium  Date Value Ref Range Status  08/01/2020 4.6 3.5 - 5.2 mmol/L Final  04/14/2014 4.0 3.5 - 5.1 mmol/L Final          Failed - Cr in normal range and within 180 days    Creatinine  Date Value Ref Range Status  04/14/2014 0.75 0.60 - 1.30 mg/dL Final   Creatinine, Ser  Date Value Ref Range Status  08/01/2020 0.93 0.57 - 1.00 mg/dL Final    Comment:                   **Effective August 06, 2020 [facility] will begin**                  reporting the 2021 CKD-EPI creatinine equation that                  estimates kidney function without a race variable.           Failed - Ca in normal range and within 180 days    Calcium  Date Value Ref Range Status  08/01/2020 9.7 8.7 - 10.3 mg/dL Final   Calcium, Total  Date Value Ref Range Status  04/14/2014 9.1 8.5 - 10.1 mg/dL Final          Passed - Patient is not pregnant      Passed - Last BP in normal range    BP Readings from Last 1 Encounters:  10/09/20 123/78          Passed - Valid encounter within last 6 months    Recent Outpatient Visits           3 months ago  Viral URI   [facility] [place], Dionne Bucy, MD   4 months ago Pre-op examination   [facility], Laurita Quint, FNP   6 months ago History of pneumonia   [facility], [place], [place]   7 months ago Acute hypoxemic respiratory failure due to COVID-19 [facility])   [place], PA-C   8 months ago Type 2 diabetes mellitus with stage 3a chronic kidney disease, without long-term current use of insulin [facility])   [facility] [place], [place], [place]

## 2021-02-01 ENCOUNTER — Other Ambulatory Visit: Payer: Self-pay | Admitting: Family Medicine

## 2021-02-01 DIAGNOSIS — I10 Essential (primary) hypertension: Secondary | ICD-10-CM

## 2021-02-01 NOTE — Telephone Encounter (Signed)
Refill per protocol, labs current, last OV 2 months ago and current, noted to return 03/2021 for follow up.

## 2021-02-04 NOTE — Telephone Encounter (Signed)
Opened in error. KW °

## 2021-02-05 ENCOUNTER — Ambulatory Visit: Payer: Medicare Other

## 2021-02-07 ENCOUNTER — Other Ambulatory Visit: Payer: Self-pay

## 2021-02-07 ENCOUNTER — Ambulatory Visit (INDEPENDENT_AMBULATORY_CARE_PROVIDER_SITE_OTHER): Payer: Medicare Other | Admitting: Family Medicine

## 2021-02-07 DIAGNOSIS — Z5329 Procedure and treatment not carried out because of patient's decision for other reasons: Secondary | ICD-10-CM

## 2021-02-15 DIAGNOSIS — Z20822 Contact with and (suspected) exposure to covid-19: Secondary | ICD-10-CM | POA: Diagnosis not present

## 2021-02-19 ENCOUNTER — Telehealth: Payer: Self-pay

## 2021-02-19 ENCOUNTER — Other Ambulatory Visit: Payer: Self-pay | Admitting: Family Medicine

## 2021-02-19 DIAGNOSIS — E1142 Type 2 diabetes mellitus with diabetic polyneuropathy: Secondary | ICD-10-CM

## 2021-02-19 MED ORDER — ONETOUCH VERIO VI STRP: ORAL_STRIP | 3 refills | Status: DC

## 2021-02-19 NOTE — Telephone Encounter (Signed)
Copied from [place] [id number]. Topic: Quick Communication - Rx Refill/Question >> Feb 19, 2021  9:45 AM Leward Quan A wrote: Medication: glucose blood (ONETOUCH VERIO) test strip Need today please completely out  Has the patient contacted their pharmacy? Yes.   (Agent: If no, request that the patient contact the pharmacy for the refill.) (Agent: If yes, when and what did the pharmacy advise?)  Preferred Pharmacy (with phone number or street name): [facility] [place]  Phone:  [phone] Fax:  [phone]     Agent: Please be advised that RX refills may take up to 3 business days. We ask that you follow-up with your pharmacy.

## 2021-02-19 NOTE — Telephone Encounter (Signed)
Copied from [place] [id number]. Topic: General - Other >> Feb 19, 2021  9:47 AM Leward Quan A wrote: Reason for CRM: Patient would like a call back to schedule a physical please call Ph# [phone]

## 2021-02-26 ENCOUNTER — Telehealth: Payer: Self-pay

## 2021-02-26 NOTE — Progress Notes (Signed)
Chronic Care Management Pharmacy Assistant   Name: Alexis Lloyd  MRN: [id number] DOB: 1961/01/16  Reason for Encounter:Diabetes Disease State Call.   Recent office visits:  02/07/2021 Dr. Brita Romp MD  (PCP Office)   Recent consult visits:  No recent [facility] visits:  None in previous 6 months  Medications: Outpatient Encounter Medications as of 02/26/2021  Medication Sig Note   albuterol (VENTOLIN HFA) 108 (90 Base) MCG/ACT inhaler INHALE 2 PUFFS BY MOUTH EVERY 6 HOURS AS NEEDED    ascorbic Acid (VITAMIN C) 500 MG CPCR Take 500 mg by mouth daily.    aspirin EC 81 MG tablet Take 1 tablet (81 mg total) by mouth daily. Swallow whole.    Blood Glucose Monitoring Suppl (ONETOUCH VERIO) w/Device KIT To check blood sugar once daily    cetirizine (ZYRTEC) 10 MG tablet Take 1 tablet (10 mg total) by mouth daily.    Cholecalciferol (VITAMIN D3) 50 MCG (2000 UT) TABS Take 2,000 Units by mouth.    Cyanocobalamin (VITAMIN B-12) 3000 MCG SUBL Place 1 tablet under the tongue daily at 6 (six) AM. Gummies    glipiZIDE (GLUCOTROL) 10 MG tablet TAKE 1 TABLET BY MOUTH  TWICE DAILY BEFORE A MEAL    glucose blood (ONETOUCH VERIO) test strip To check blood sugar once daily    HYDROcodone-acetaminophen (NORCO/VICODIN) 5-325 MG tablet Take 1 tablet by mouth every 6 (six) hours as needed.    ibuprofen (ADVIL) 200 MG tablet Take 400 mg by mouth every 6 (six) hours as needed.    Lancets (ONETOUCH ULTRASOFT) lancets To check BS once daily    lisinopril-hydrochlorothiazide (ZESTORETIC) 20-12.5 MG tablet Take 2 tablets by mouth daily.    montelukast (SINGULAIR) 10 MG tablet Take 1 tablet by mouth once daily    pantoprazole (PROTONIX) 40 MG tablet Take 1 tablet (40 mg total) by mouth daily.    rosuvastatin (CRESTOR) 10 MG tablet Take 1 tablet (10 mg total) by mouth daily.    Semaglutide (RYBELSUS) 7 MG TABS Take 7 mg by mouth daily before breakfast. 07/24/2020: Prescribed by [name], NP    spironolactone (ALDACTONE) 25 MG tablet Take 1 tablet (25 mg total) by mouth daily.    SUMAtriptan (IMITREX) 50 MG tablet Take 1 tablet (50 mg total) by mouth every 2 (two) hours as needed for migraine. No more than 230m in 24 hours    verapamil (CALAN) 80 MG tablet TAKE 1 TABLET BY MOUTH  TWICE DAILY    zinc gluconate 50 MG tablet Take 50 mg by mouth daily.    No facility-administered encounter medications on file as of 02/26/2021.    Care Gaps: PAP Smear- Modifier Shingrix Mammogram COVID-19 Vaccine. Influenza Vaccine Hemoglobin A1C Star Rating Drugs: Lisinopril-HCTZ 20-12.5 mg [facility] 10 mg [facility] Rosuvastatin 10 mg [facility] Ry[facility]us 7 mg [facility] Medication fill Gaps: None  Recent Relevant Labs: Lab Results  Component Value Date/Time   HGBA1C 7.3 (H) 08/01/2020 11:27 AM   HGBA1C 7.6 (H) 06/11/2020 03:07 AM   MICROALBUR 50 08/31/2017 10:06 AM   MICROALBUR 50 03/05/2016 02:04 PM    Kidney Function Lab Results  Component Value Date/Time   CREATININE 0.93 08/01/2020 11:27 AM   CREATININE 0.62 06/15/2020 02:48 AM  CREATININE 0.75 04/14/2014 09:25 AM   CREATININE 0.71 11/02/2013 10:07 AM   GFRNONAA 67 08/01/2020 11:27 AM   GFRNONAA >60 06/15/2020 02:48 AM   GFRNONAA >60 04/14/2014 09:25 AM   GFRNONAA >60 11/02/2013 10:07 AM   GFRAA 77 08/01/2020 11:27 AM   GFRAA >60 04/14/2014 09:25 AM   GFRAA >60 11/02/2013 10:07 AM    Current antihyperglycemic regimen:  Glipizide 10 mg twice daily  Rybelsus 7 mg daily  What recent interventions/DTPs have been made to improve glycemic control:  Patient states when she wakes up in the morning her sugar level are still elevated,and she is not sure if it is because she is taking her  Glipizide at the wrong time of day.Patient states she takes her Glipizide before her breakfast and evening meals.Patient reports she takes her Rybelsus first thing in the morning before her meal, and she will check her blood sugar right after ranging 195-200, but later in the afternoon her blood sugar will be between 130-157. Have there been any recent hospitalizations or ED visits since last visit with CPP? No Patient reports hypoglycemic symptoms, including Shaky Patient states since her blood sugar seems to always be elevated she will become shaky when her blood sugar is around 130's. Patient denies hyperglycemic symptoms, including blurry vision, excessive thirst, fatigue, polyuria, and weakness How often are you checking your blood sugar? twice daily What are your blood sugars ranging?  Fasting:  Patient states her blood sugar ranges 195-200 in the morning. Before meals: None After meals:  Patient states her blood sugar ranges 130-157 between 1-3 pm. Bedtime: None During the week, how often does your blood glucose drop below 70? Never Are you checking your feet daily/regularly?   Patient denies pain,numbness or tingling sensation in her feet. .  Patient states she saw a new medication on TV for diabetics type 2.Patient states it is a injection that is given weekly that starts with the letter "S or T". Patient states if there is something else she can take instead of daily  she prefers to do that.Patient states if the injection is safe for her she would like to switch. Notified Clinical Pharmacist.  (02/28/2021) Per Clinical Pharmacist,She should be careful to avoid late night snacking and continue taking her glipizide before meals and taking Rybelsus 30 minutes before breakfast with as sip of water. If she would like she can talk with Endocrinology about switching from Rybelsus to ozempic, if she does she should let us know so we can update her application.  Adherence Review: Is the  patient currently on a STATIN medication? Yes Is the patient currently on ACE/ARB medication? Yes Does the patient have >5 day gap between last estimated fill dates? Yes   Telephone follow up appointment with Care management team member scheduled for : 04/26/2021 at 1:00 pm.  [place] Pharmacist Assistant [phone]

## 2021-02-28 ENCOUNTER — Telehealth: Payer: Self-pay

## 2021-02-28 NOTE — Telephone Encounter (Signed)
Copied from [place] [id number]. Topic: General - Other >> Feb 28, 2021  8:26 AM Leward Quan A wrote: Reason for CRM: Patient called in stated that she had a missed call and did not know who reached out to her. Can be reached at Ph# [phone]

## 2021-03-04 ENCOUNTER — Other Ambulatory Visit: Payer: Self-pay | Admitting: Physician Assistant

## 2021-03-04 ENCOUNTER — Other Ambulatory Visit: Payer: Self-pay | Admitting: Family Medicine

## 2021-03-04 DIAGNOSIS — K219 Gastro-esophageal reflux disease without esophagitis: Secondary | ICD-10-CM

## 2021-03-04 DIAGNOSIS — J40 Bronchitis, not specified as acute or chronic: Secondary | ICD-10-CM

## 2021-03-05 NOTE — Telephone Encounter (Signed)
Requested medications are due for refill today yes  Requested medications are on the active medication list yes  Last refill 8/30  Last visit 10/09/20 for URI, prior visit 08/01/20  Future visit scheduled 04/26/21, pt got rx 8/30 and NO SHOW 02/07/21  Notes to clinic Please assess as pt already received refill, then did not attend appt, does have appt scheduled in Nov.

## 2021-03-22 ENCOUNTER — Telehealth: Payer: Self-pay

## 2021-03-22 NOTE — Progress Notes (Addendum)
Chronic Care Management Pharmacy Assistant   Name: KARRYN KOSINSKI  MRN: [id number] DOB: 11/30/1960  Reason for Encounter: Medication Review/Patient assistance renewal application for  Rybelsus   Recent office visits:  No recent Office Visit  Recent consult visits:  No recent [facility] visits:  None in previous 6 months  Medications: Outpatient Encounter Medications as of 03/22/2021  Medication Sig Note   albuterol (VENTOLIN HFA) 108 (90 Base) MCG/ACT inhaler INHALE 2 PUFFS BY MOUTH EVERY 6 HOURS AS NEEDED    ascorbic Acid (VITAMIN C) 500 MG CPCR Take 500 mg by mouth daily.    aspirin EC 81 MG tablet Take 1 tablet (81 mg total) by mouth daily. Swallow whole.    Blood Glucose Monitoring Suppl (ONETOUCH VERIO) w/Device KIT To check blood sugar once daily    cetirizine (ZYRTEC) 10 MG tablet Take 1 tablet (10 mg total) by mouth daily.    Cholecalciferol (VITAMIN D3) 50 MCG (2000 UT) TABS Take 2,000 Units by mouth.    Cyanocobalamin (VITAMIN B-12) 3000 MCG SUBL Place 1 tablet under the tongue daily at 6 (six) AM. Gummies    glipiZIDE (GLUCOTROL) 10 MG tablet TAKE 1 TABLET BY MOUTH  TWICE DAILY BEFORE A MEAL    glucose blood (ONETOUCH VERIO) test strip To check blood sugar once daily    HYDROcodone-acetaminophen (NORCO/VICODIN) 5-325 MG tablet Take 1 tablet by mouth every 6 (six) hours as needed.    ibuprofen (ADVIL) 200 MG tablet Take 400 mg by mouth every 6 (six) hours as needed.    Lancets (ONETOUCH ULTRASOFT) lancets To check BS once daily    lisinopril-hydrochlorothiazide (ZESTORETIC) 20-12.5 MG tablet Take 2 tablets by mouth daily.    montelukast (SINGULAIR) 10 MG tablet Take 1 tablet by mouth once daily    pantoprazole (PROTONIX) 40 MG tablet Take 1 tablet by mouth once daily    rosuvastatin (CRESTOR) 10 MG tablet Take 1 tablet (10 mg total) by mouth daily.    Semaglutide (RYBELSUS) 7 MG TABS Take 7 mg by mouth daily before breakfast. 07/24/2020: Prescribed by  [name], NP    spironolactone (ALDACTONE) 25 MG tablet Take 1 tablet (25 mg total) by mouth daily.    SUMAtriptan (IMITREX) 50 MG tablet Take 1 tablet (50 mg total) by mouth every 2 (two) hours as needed for migraine. No more than 247m in 24 hours    verapamil (CALAN) 80 MG tablet TAKE 1 TABLET BY MOUTH  TWICE DAILY    zinc gluconate 50 MG tablet Take 50 mg by mouth daily.    No facility-administered encounter medications on file as of 03/22/2021.    Care Gaps: PAP Smear- Modifier (Last Completed 08/31/2020) Foot Exam (Last Completed 10/24/2019) Shingrix Mammogram (Last Completed 02/20/2016) COVID-19 Vaccine. Influenza Vaccine Hemoglobin A1C (Last Completed 08/01/2020) Ophthalmology Exam (Last Completed 03/06/2020) Star Rating Drugs: Lisinopril-HCTZ 20-12.5 mg [facility] Glip[facility]ide 10 mg [facility] Rosuvastatin 10 mg [facility] Rybelsus 7 mg [facility] through [employer] Medication fill Gaps: None  Patient Assistance Renewal for year 2023:  I received a task from AJunius Argyle CPP requesting that I start the renewal  process for patient assistance on the medication Rybelsus for patient to continue to receive assistance for year 2023.  I Left voice message to inform  patient that the application  will be mailed.I informed  her once she receives the application she will need to complete her part of the application and return it to her PCP office for [name], CPP to fax over to [employer] for processing.  The application will need to be faxed to [phone] and the phone number that can be called to check the status of the application will be [phone]. Patient  was provided my phone number of [phone] if she has any questions.    Application emailed to [name], CPP for review and to mail to patient home.  [place] Pharmacist Assistant [phone]   Addendum 10/25:   Patient assistance returned by patient. Awaiting signature then will fax for submission.

## 2021-03-28 ENCOUNTER — Other Ambulatory Visit: Payer: Self-pay

## 2021-03-28 ENCOUNTER — Ambulatory Visit: Payer: Medicare Other | Attending: Internal Medicine

## 2021-03-28 DIAGNOSIS — Z23 Encounter for immunization: Secondary | ICD-10-CM

## 2021-03-28 MED ORDER — PFIZER COVID-19 VAC BIVALENT 30 MCG/0.3ML IM SUSP
INTRAMUSCULAR | 0 refills | Status: DC
  Filled 2021-03-28: qty 0.3, 1d supply, fill #0

## 2021-03-28 NOTE — Progress Notes (Signed)
   Covid-19 Vaccination Clinic  Name:  Alexis Lloyd    MRN: [id number] DOB: 1961-01-26  03/28/2021  [name] was observed post Covid-19 immunization for 15 minutes without incident. She was provided with Vaccine Information Sheet and instruction to access the V-Safe system.   [name] was instructed to call 911 with any severe reactions post vaccine: Difficulty breathing  Swelling of face and throat  A fast heartbeat  A bad rash all over body  Dizziness and weakness   Immunizations Administered     Name Date Dose VIS Date Route   Pfizer Covid-19 Vaccine Bivalent Booster 03/28/2021  9:23 AM 0.3 mL 02/06/2021 Intramuscular   Manufacturer: [place]   Lot: [place]: [id number]       Covid-19 Vaccination Clinic  Name:  Alexis Lloyd    MRN: [id number] DOB: 11-Feb-1961  03/28/2021  [name] was observed post Covid-19 immunization for 15 minutes without incident. She was provided with Vaccine Information Sheet and instruction to access the V-Safe system.   [name] was instructed to call 911 with any severe reactions post vaccine: Difficulty breathing  Swelling of face and throat  A fast heartbeat  A bad rash all over body  Dizziness and weakness   Immunizations Administered     Name Date Dose VIS Date Route   Pfizer Covid-19 Vaccine Bivalent Booster 03/28/2021  9:23 AM 0.3 mL 02/06/2021 Intramuscular   Manufacturer: [place]   Lot: [id number]   [place]: [phone]

## 2021-04-04 ENCOUNTER — Other Ambulatory Visit: Payer: Self-pay | Admitting: Family Medicine

## 2021-04-04 DIAGNOSIS — K219 Gastro-esophageal reflux disease without esophagitis: Secondary | ICD-10-CM

## 2021-04-05 ENCOUNTER — Telehealth: Payer: Self-pay

## 2021-04-05 NOTE — Telephone Encounter (Signed)
Requested Prescriptions  Pending Prescriptions Disp Refills  . pantoprazole (PROTONIX) 40 MG tablet [Pharmacy Med Name: Pantoprazole Sodium 40 MG Oral Tablet Delayed Release] 30 tablet 0    Sig: Take 1 tablet by mouth once daily     Gastroenterology: Proton Pump Inhibitors Passed - 04/04/2021  6:05 PM      Passed - Valid encounter within last 12 months    Recent Outpatient Visits          1 month ago No-show for appointment   [facility], Dionne Bucy, MD   5 months ago Viral URI   [facility], Dionne Bucy, MD   6 months ago Pre-op examination   [facility] [facility], Laurita Quint, FNP   8 months ago History of pneumonia   [place], [place]   9 months ago Acute hypoxemic respiratory failure due to COVID-19 [facility])   [facility] [place], [place], [place]

## 2021-04-05 NOTE — Progress Notes (Signed)
Per Clinical Pharmacist,Please reach out to patient and see if we can  move patient telephone appointment to a earlier time on 04/26/2021 or reschedule.  Patient reschedule her appointment to 04/22/2021 at 3:45 pm for CCM Follow up.Patient requested a reminder call on 04/19/2021.  [place] Pharmacist Assistant [phone]

## 2021-04-09 ENCOUNTER — Ambulatory Visit: Payer: Self-pay

## 2021-04-09 DIAGNOSIS — E1142 Type 2 diabetes mellitus with diabetic polyneuropathy: Secondary | ICD-10-CM

## 2021-04-09 NOTE — Progress Notes (Signed)
[place] Patient Assistance form for Alexis Lloyd completed by patient and faxed for review on 04/09/21  [name], PharmD, Para [place], [place] [phone]

## 2021-04-15 ENCOUNTER — Telehealth: Payer: Self-pay

## 2021-04-15 NOTE — Telephone Encounter (Signed)
Copied from [place] [id number]. Topic: Appointment Scheduling - Scheduling Inquiry for Clinic >> Apr 15, 2021 10:39 AM Tessa Lerner A wrote: Reason for CRM: The patient would like to be seen for a shingles vaccine as well as a pneumonia vaccine  Please contact further

## 2021-04-16 NOTE — Telephone Encounter (Signed)
Lmtcb okay for [facility] nurse triage to schedule vaccines with either [name] or Mikey Kirschner when patient returns call. KW

## 2021-04-19 ENCOUNTER — Telehealth: Payer: Self-pay

## 2021-04-19 NOTE — Progress Notes (Signed)
APPOINTMENT REMINDER   Harini D Strother was reminded to have all medications, supplements and any blood glucose and blood pressure readings available for review with [name], Pharm. D, at her telephone visit on 04/22/2021 at 3:45 pm.  Patient confirm appointment.  Care Gaps: PAP Smear- Modifier (Last Completed 08/31/2020) Pneumococcal Vaccine Foot Exam (Last Completed 10/24/2019) Shingrix Mammogram (Last Completed 02/20/2016) COVID-19 Vaccine. Influenza Vaccine Hemoglobin A1C (Last Completed 08/01/2020) Ophthalmology Exam (Last Completed 03/06/2020) Star Rating Drugs: Lisinopril-HCTZ 20-12.5 mg last filled on 02/02/2021 for 90 day supply at [facility]. Glipizide 10 mg last filled on 03/06/2021 for 90 day supply at [facility]. Rosuvastatin 10 mg last filled on 01/23/2021 for 90 day supply at [facility]. Rybelsus 7 mg last filled on 10/19/2020 for 90 day supply at [facility].(Receive assistance through [employer]) Medication fill Gaps: None  Web designer [phone]

## 2021-04-22 ENCOUNTER — Ambulatory Visit (INDEPENDENT_AMBULATORY_CARE_PROVIDER_SITE_OTHER): Payer: Medicare Other

## 2021-04-22 DIAGNOSIS — J4520 Mild intermittent asthma, uncomplicated: Secondary | ICD-10-CM

## 2021-04-22 DIAGNOSIS — E1142 Type 2 diabetes mellitus with diabetic polyneuropathy: Secondary | ICD-10-CM

## 2021-04-22 NOTE — Progress Notes (Signed)
Chronic Care Management Pharmacy Note  04/30/2021 Name:  Alexis Lloyd MRN:  [id number] DOB:  December 07, 1960  Summary: Patient presents for CCM follow-up.   Recommendations/Changes made from today's visit: Continue current medications  Plan: CPP follow-up 6 months   Subjective: Alexis Lloyd is an 60 y.o. year old female who is a primary patient of Gwyneth Sprout, [place].  The CCM team was consulted for assistance with disease management and care coordination needs.    Engaged with patient by telephone for follow up visit in response to provider referral for pharmacy case management and/or care coordination services.   Consent to Services:  The patient was given information about Chronic Care Management services, agreed to services, and gave verbal consent prior to initiation of services.  Please see initial visit note for detailed documentation.   Patient Care Team: Gwyneth Sprout, FNP as PCP - General (Family Medicine) Imagene Riches, CNM as Midwife (Obstetrics) Germaine Pomfret, [facility] (Pharmacist) Lonia Farber, MD as Consulting Physician (Endocrinology)  Recent office visits: 10/09/20: Patient presented to Dr. Brita Romp for viral URI  06/27/20: Video visit with Fenton Malling, PA-C for acute respiratory failure. Terconazole stopped.  05/07/20: Patient presented to Fenton Malling, PA-C for follow-up. Metformin stopped due to diarrhea and incontinence. Patient started on pioglitazone. Omeprazole changed to pantoprazole. Fluconazole started for yeast infection.   Recent consult visits: 11/19/20: Patient presented to Malissa Hippo, NP (Endocrinology) for follow-up.  08/13/20: Patient presented to Malissa Hippo, NP (Endocrinology) for follow-up.   Hospital visits: 1/2-06/15/20: Patient hospitalized for Covid infection. Patient treated with IV decadron, Zofran, and Remdesivir.   Objective:  Lab Results  Component Value Date   CREATININE 0.93 08/01/2020   BUN  24 08/01/2020   GFRNONAA 67 08/01/2020   GFRAA 77 08/01/2020   NA 142 08/01/2020   K 4.6 08/01/2020   CALCIUM 9.7 08/01/2020   CO2 23 08/01/2020    Lab Results  Component Value Date/Time   HGBA1C 7.3 (H) 08/01/2020 11:27 AM   HGBA1C 7.6 (H) 06/11/2020 03:07 AM   MICROALBUR 50 08/31/2017 10:06 AM   MICROALBUR 50 03/05/2016 02:04 PM    Last diabetic Eye exam:  Lab Results  Component Value Date/Time   HMDIABEYEEXA No Retinopathy 03/06/2020 12:00 AM    Last diabetic Foot exam: No results found for: HMDIABFOOTEX   Lab Results  Component Value Date   CHOL 233 (H) 08/01/2020   HDL 38 (L) 08/01/2020   LDLCALC 178 (H) 08/01/2020   TRIG 95 08/01/2020   CHOLHDL 7.1 (H) 09/16/2018    Hepatic Function Latest Ref Rng & Units 08/01/2020 06/15/2020 06/14/2020  Total Protein 6.0 - 8.5 g/dL 7.1 6.4(L) 6.8  Albumin 3.8 - 4.8 g/dL 4.3 2.8(L) 2.8(L)  AST 0 - 40 IU/L 8 11(L) 11(L)  ALT 0 - 32 IU/L 13 22 21   Alk Phosphatase 44 - 121 IU/L 64 45 46  Total Bilirubin 0.0 - 1.2 mg/dL 0.3 0.6 0.5    Lab Results  Component Value Date/Time   TSH 2.190 09/16/2018 11:12 AM   TSH 2.510 08/31/2017 10:50 AM    CBC Latest Ref Rng & Units 08/01/2020 06/10/2020 07/07/2019  WBC 3.4 - 10.8 x10E3/uL 6.5 10.7(H) 9.4  Hemoglobin 11.1 - 15.9 g/dL 11.0(L) 11.2(L) 11.9  Hematocrit 34.0 - 46.6 % 33.2(L) 33.9(L) 34.9  Platelets 150 - 450 x10E3/uL 243 308 324    No results found for: VD25OH  Clinical ASCVD: No  The 10-year ASCVD risk score (Arnett DK,  et al., 2019) is: 23.9%   Values used to calculate the score:     Age: 27 years     Sex: Female     Is Non-Hispanic African American: Yes     Diabetic: Yes     Tobacco smoker: No     Systolic Blood Pressure: 809 mmHg     Is BP treated: Yes     HDL Cholesterol: 38 mg/dL     Total Cholesterol: 233 mg/dL    Depression screen [facility] 2/9 10/09/2020 08/01/2020 10/28/2019  Decreased Interest 0 0 0  Down, Depressed, Hopeless 0 0 0  PHQ - 2 Score 0 0 0  Altered sleeping  0 0 -  Tired, decreased energy 0 0 -  Change in appetite 0 0 -  Feeling bad or failure about yourself  0 0 -  Trouble concentrating 0 0 -  Moving slowly or fidgety/restless 0 0 -  Suicidal thoughts 0 0 -  PHQ-9 Score 0 0 -  Difficult doing work/chores Not difficult at all Not difficult at all -  Some recent data might be hidden     Social History   Tobacco Use  Smoking Status Former   Packs/day: 0.25   Years: 21.00   Pack years: 5.25   Types: Cigarettes   Quit date: 06/08/1997   Years since quitting: 23.9  Smokeless Tobacco Never   BP Readings from Last 3 Encounters:  10/09/20 123/78  09/19/20 (!) 121/92  08/01/20 116/69   Pulse Readings from Last 3 Encounters:  10/09/20 86  09/19/20 90  08/01/20 88   Wt Readings from Last 3 Encounters:  10/09/20 230 lb (104.3 kg)  09/19/20 230 lb (104.3 kg)  08/01/20 233 lb (105.7 kg)    Assessment/Interventions: Review of patient past medical history, allergies, medications, health status, including review of consultants reports, laboratory and other test data, was performed as part of comprehensive evaluation and provision of chronic care management services.   SDOH:  (Social Determinants of Health) assessments and interventions performed: Yes SDOH Interventions    Flowsheet Row Most Recent Value  SDOH Interventions   Financial Strain Interventions Other (Comment)  [PAP]         CCM Care Plan  Allergies  Allergen Reactions   Allegra [Fexofenadine] Nausea And Vomiting   Avelox [Moxifloxacin] Other (See Comments)    Hallucinations   Bactrim [Sulfamethoxazole-Trimethoprim] Other (See Comments)    unknown   Etodolac Nausea Only   Fish-Derived Products Nausea And Vomiting    With "seafood"   Penicillins Diarrhea and Nausea And Vomiting   Shellfish Allergy Nausea Only   Sulfa Antibiotics Other (See Comments)    unknown   Macrobid [Nitrofurantoin Monohyd Macro] Other (See Comments)    Constipation and globus  hystericus.    Medications Reviewed Today     Reviewed by Virginia Crews, MD (Physician) on 10/09/20 at [phone]  Med List Status: <None>   Medication Order Taking? Sig Documenting Provider Last Dose Status Informant  acetaminophen (TYLENOL) 650 MG CR tablet [id number] Yes Take 650-1,300 mg by mouth every 8 (eight) hours as needed for pain. [provider] Taking Active   albuterol (VENTOLIN HFA) 108 (90 Base) MCG/ACT inhaler [id number] Yes INHALE 2 PUFFS BY MOUTH EVERY 6 HOURS AS NEEDED Mar Daring, PA-C Taking Active   ascorbic Acid (VITAMIN C) 500 MG CPCR [id number] Yes Take 500 mg by mouth daily. [provider] Taking Active   aspirin EC 81 MG tablet [id number] Yes Take 1  tablet (81 mg total) by mouth daily. Swallow whole. Mar Daring, PA-C Taking Active   Blood Glucose Monitoring Suppl [facility] VERIO) w/Device Drucie Opitz [id number] Yes To check blood sugar once daily Mar Daring, PA-C Taking Active   cetirizine (ZYRTEC) 10 MG tablet [id number] Yes Take 1 tablet (10 mg total) by mouth daily. Margarita Rana, MD Taking Active Other  Cholecalciferol (VITAMIN D3) 50 MCG (2000 UT) TABS [id number] Yes Take 2,000 Units by mouth. [provider] Taking Active   Cyanocobalamin (VITAMIN B-12) 3000 MCG SUBL [id number] Yes Place 1 tablet under the tongue daily at 6 (six) AM. Gummies [provider] Taking Active   diclofenac Sodium (VOLTAREN) 1 % GEL [id number] Yes Apply 4 g topically 4 (four) times daily as needed. [provider] Taking Active   glipiZIDE (GLUCOTROL) 10 MG tablet [id number] Yes TAKE 1 TABLET BY MOUTH  TWICE DAILY BEFORE A MEAL Mar Daring, PA-C Taking Active   glucose blood (ONETOUCH VERIO) test strip [id number] Yes To check blood sugar once daily Mar Daring, PA-C Taking Active Other  Lancets [facility] ULTRASOFT) lancets [id number] Yes To check BS once daily Mar Daring, PA-C Taking Active    lisinopril-hydrochlorothiazide (ZESTORETIC) 20-12.5 MG tablet [id number] Yes TAKE 2 TABLETS BY MOUTH  DAILY Mar Daring, PA-C Taking Active Other  montelukast (SINGULAIR) 10 MG tablet [id number] Yes Take 1 tablet by mouth once daily Fenton Malling M, PA-C Taking Active   pantoprazole (PROTONIX) 40 MG tablet [id number] Yes Take 1 tablet (40 mg total) by mouth daily. Mar Daring, PA-C Taking Active   rosuvastatin (CRESTOR) 10 MG tablet [id number] Yes Take 1 tablet (10 mg total) by mouth daily. Fenton Malling M, PA-C Taking Active   Semaglutide (RYBELSUS) 7 MG TABS [id number] Yes Take 7 mg by mouth daily before breakfast. [provider] Taking Active            Med Note Michaelle Birks, Cathe Mons A   Tue Jul 24, 2020  8:46 AM) Prescribed by [name], NP   spironolactone (ALDACTONE) 25 MG tablet [id number] Yes Take 1 tablet (25 mg total) by mouth daily. Mar Daring, PA-C Taking Active Other  SUMAtriptan (IMITREX) 50 MG tablet [id number] Yes Take 1 tablet (50 mg total) by mouth every 2 (two) hours as needed for migraine. No more than 260m in 24 hours BMar Daring PVermontTaking Active Other  verapamil (CALAN) 80 MG tablet [id number] TAKE 1 TABLET BY MOUTH  TWICE DAILY Bacigalupo, ADionne Bucy MD Taking Active   zinc gluconate 50 MG tablet [id number] Take 50 mg by mouth daily. [provider] Taking Active             Patient Active Problem List   Diagnosis Date Noted   Pain due to onychomycosis of toenails of both feet 10/24/2019   Benign skin lesion 10/24/2019   Recurrent UTI 01/22/2018   Recurrent vaginitis 056/97/9480  History of Helicobacter pylori infection 11/05/2017   Intrinsic eczema 11/05/2017   CLE (columnar lined esophagus)    Stomach irritation    Gastric polyp    Problems with swallowing and mastication    Heartburn    Benign neoplasm of transverse colon    Internal hemorrhoids    Diverticulosis of large intestine  without diverticulitis    Warts of foot 06/30/2017   Achilles tendon contracture 01/28/2017   Hallux rigidus of left foot 01/28/2017   Hallux rigidus, right foot 01/28/2017   Pes planus 01/28/2017  Posterior tibial tendinitis of left lower extremity 01/28/2017   Posterior tibial tendinitis of right lower extremity 01/28/2017   Asthma 09/03/2015   Foot pain 07/16/2015   DM type 2, uncontrolled, with neuropathy 07/13/2015   Abnormal finding on mammography, microcalcification 04/25/2015   Intraductal carcinoma of breast 04/24/2015   Airway hyperreactivity 11/18/2014   Breast cyst 11/18/2014   Diabetic neuropathy ([place]) 11/18/2014   GERD (gastroesophageal reflux disease) 11/18/2014   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   Hypercholesteremia 11/18/2014   Hypertension 11/18/2014   Class 2 severe obesity due to excess calories with serious comorbidity and body mass index (BMI) of 39.0 to 39.9 in adult ([place]) 11/18/2014   Arthritis, degenerative 11/18/2014   Allergic rhinitis, seasonal 11/18/2014   OSA (obstructive sleep apnea) 11/18/2014   AC (acromioclavicular) arthritis 11/08/2014   Personal history of breast cancer 10/04/2014   Primary osteoarthritis of right knee 11/14/2013    Immunization History  Administered Date(s) Administered   PFIZER Comirnaty(Gray Top)Covid-19 Tri-Sucrose Vaccine 08/15/2020   PFIZER(Purple Top)SARS-COV-2 Vaccination 07/31/2019, 09/13/2019   Pfizer Covid-19 Vaccine Bivalent Booster 74yr & up 03/28/2021   Pneumococcal Polysaccharide-23 06/22/2009, 08/06/2015   Tdap 08/31/2017    Conditions to be addressed/monitored:  Hypertension, Hyperlipidemia, Diabetes, GERD, Asthma and Osteoarthritis  Care Plan : General Pharmacy (Adult)  Updates made by FGermaine Pomfret RBlairsvillesince 04/30/2021 12:00 AM     Problem: Hypertension, Hyperlipidemia, Diabetes, GERD, Asthma and Osteoarthritis   Priority: High     Long-Range Goal: Patient-Specific Goal   Start Date:  07/23/2020  Expected End Date: 01/25/2022  This Visit's Progress: On track  Recent Progress: On track  Priority: High  Note:   Current Barriers:  Unable to independently afford treatment regimen  Pharmacist Clinical Goal(s):  Over the next 90 days, patient will verbalize ability to afford treatment regimen maintain control of Diabetes as evidenced by A1c less than 8%  through collaboration with PharmD and provider.    Interventions: 1:1 collaboration with ALavon Paganini MD regarding development and update of comprehensive plan of care as evidenced by provider attestation and co-signature Inter-disciplinary care team collaboration (see longitudinal plan of care) Comprehensive medication review performed; medication list updated in electronic medical record  Hypertension (BP goal <140/90) -controlled -Current treatment: Lisinopril-HCTZ 20-12.5 mg 2 tablets daily  Spironolactone 25 mg daily  Verapamil 80 mg daily  -Medications previously tried: Amlodipine, spironolactone   -Current home readings: NA -Current dietary habits: Limiting fried food. Eating more vegetables  -Current exercise habits: PT weekly -Denies hypotensive/hypertensive symptoms -Educated on Daily salt intake goal < 2300 mg; -Counseled to monitor BP at home if feeling symptomatic, document, and provide log at future appointments -Recommended to continue current medication  Hyperlipidemia: (LDL goal < 70) -uncontrolled -Current treatment: Rosuvastatin 10 mg daily -Medications previously tried: Pravastatin, Lovastatin Importance of limiting foods high in cholesterol; -Recommended rechecking lipid panel    Diabetes (A1c goal <8%) -Managed by TMee Hives-Controlled -Current medications: Glipizide 10 mg twice daily  Rybelsus 7 mg daily  -Medications previously tried: Januvia, Metformin (diarrhea), Pioglitazone ([place]), Trulicity  -Current home glucose readings fasting glucose: 170-210s  -Denies  hypoglycemic/hyperglycemic symptoms -Patient takes glipizide at bedtime with snack. Counseled to adjust timing to dinner and avoiding late night snack. Patient sometimes feels she has to eat to avoid hypoglycemia due to her glipizide.  -Could consider increasing Rybelsus to 14 mg daily and decreasing glipizide 5 mg twice daily to minimize risk of hypoglycemia.   Asthma (Goal: control symptoms and prevent exacerbations) -controlled -Current  treatment  Ventolin HFA 108 mcg/act 2 puff every 6 hours as needed  Montelukast 10 mg daily  -Medications previously tried: NA  -Pulmonary function testing: NA -Exacerbations requiring treatment in last 6 months: Yes, due to recent covid infection -Patient denies consistent use of maintenance inhaler -Frequency of rescue inhaler use: twice weekly -Counseled on Proper inhaler technique; When to use rescue inhaler -Recommended to continue current medication  GERD (Goal: prevent symptoms of heartburn and reflux) -controlled -Current treatment  Pantoprazole 40 mg daily  -Medications previously tried: NA  -Recommended to continue current medication  Osteoarthritis (Goal: minimize arthritis pain) -Controlled -Current treatment  Acetaminophen CR 650 mg every 8 hours as needed Ibuprofen 200 mg 2 tablets every 6 hours as needed  -Counseled to avoid greater than 3000 mg of acetaminophen daily  -Recommended to continue current medication  Patient Goals/Self-Care Activities Over the next 90 days, patient will:  - check glucose daily (before breakfast), document, and provide at future appointments  Follow Up Plan: Telephone follow up appointment with care management team member scheduled for:  10/21/2021 at 3:45 PM        Medication Assistance:  Rybelsus obtained through [employer] medication assistance program.  Enrollment ends Dec 2022  Patient's preferred pharmacy is:  [facility] [address]), [place] - [place]) [place] [address] Phone: [phone] Fax: [phone]  [facility] ([place]) - [place], [place] [facility] [address] [place] [address] [place] [address] Phone: [phone] Fax: [phone]  Uses pill box? Yes Pt endorses 100% compliance  We discussed: Current pharmacy is preferred with insurance plan and patient is satisfied with pharmacy services Patient decided to: Continue current medication management strategy  Care Plan and Follow Up Patient Decision:  Patient agrees to Care Plan and Follow-up.  Plan: Telephone follow up appointment with care management team member scheduled for:  10/21/2021 at 3:45 PM  [name], PharmD, Para [place], [place] [phone]

## 2021-04-23 DIAGNOSIS — I152 Hypertension secondary to endocrine disorders: Secondary | ICD-10-CM | POA: Diagnosis not present

## 2021-04-23 DIAGNOSIS — E1169 Type 2 diabetes mellitus with other specified complication: Secondary | ICD-10-CM | POA: Diagnosis not present

## 2021-04-23 DIAGNOSIS — E1159 Type 2 diabetes mellitus with other circulatory complications: Secondary | ICD-10-CM | POA: Diagnosis not present

## 2021-04-23 DIAGNOSIS — E785 Hyperlipidemia, unspecified: Secondary | ICD-10-CM | POA: Diagnosis not present

## 2021-04-23 DIAGNOSIS — E1165 Type 2 diabetes mellitus with hyperglycemia: Secondary | ICD-10-CM | POA: Diagnosis not present

## 2021-04-24 ENCOUNTER — Other Ambulatory Visit: Payer: Self-pay | Admitting: Family Medicine

## 2021-04-24 DIAGNOSIS — J40 Bronchitis, not specified as acute or chronic: Secondary | ICD-10-CM

## 2021-04-24 DIAGNOSIS — I10 Essential (primary) hypertension: Secondary | ICD-10-CM

## 2021-04-24 DIAGNOSIS — K219 Gastro-esophageal reflux disease without esophagitis: Secondary | ICD-10-CM

## 2021-04-24 NOTE — Telephone Encounter (Signed)
Requested medication (s) are due for refill today:   Yes for both  Requested medication (s) are on the active medication list:   Yes for both  Future visit scheduled:   Yes with Dr.Bacigalupo on 05/17/2021   Was a No Show 2 months ago.   Last ordered: Zestoretic 02/01/2021 #180, 0 refills;   Singulair 03/05/2021 #30, 1 refill  Returned because protocol failed.   Labs due.   Provider to review for refills prior to appt.   Requested Prescriptions  Pending Prescriptions Disp Refills   lisinopril-hydrochlorothiazide (ZESTORETIC) 20-12.5 MG tablet [Pharmacy Med Name: Lisinopril-hydroCHLOROthiazide 20-12.5 MG Oral Tablet] 180 tablet 0    Sig: Take 2 tablets by mouth once daily     Cardiovascular:  ACEI + Diuretic Combos Failed - 04/24/2021  9:32 AM      Failed - Na in normal range and within 180 days    Sodium  Date Value Ref Range Status  08/01/2020 142 134 - 144 mmol/L Final  04/14/2014 141 136 - 145 mmol/L Final          Failed - K in normal range and within 180 days    Potassium  Date Value Ref Range Status  08/01/2020 4.6 3.5 - 5.2 mmol/L Final  04/14/2014 4.0 3.5 - 5.1 mmol/L Final          Failed - Cr in normal range and within 180 days    Creatinine  Date Value Ref Range Status  04/14/2014 0.75 0.60 - 1.30 mg/dL Final   Creatinine, Ser  Date Value Ref Range Status  08/01/2020 0.93 0.57 - 1.00 mg/dL Final    Comment:                   **Effective August 06, 2020 [facility] will begin**                  reporting the 2021 CKD-EPI creatinine equation that                  estimates kidney function without a race variable.           Failed - Ca in normal range and within 180 days    Calcium  Date Value Ref Range Status  08/01/2020 9.7 8.7 - 10.3 mg/dL Final   Calcium, Total  Date Value Ref Range Status  04/14/2014 9.1 8.5 - 10.1 mg/dL Final          Failed - Valid encounter within last 6 months    Recent Outpatient Visits           2 months ago No-show for  appointment   [facility], Dionne Bucy, MD   6 months ago Viral URI   [facility], Dionne Bucy, MD   7 months ago Pre-op examination   [facility], Laurita Quint, FNP   8 months ago History of pneumonia   [facility], [place], [place]   10 months ago Acute hypoxemic respiratory failure due to COVID-19 [facility])   [place], [place]              Passed - Patient is not pregnant      Passed - Last BP in normal range    BP Readings from Last 1 Encounters:  10/09/20 123/78           montelukast (SINGULAIR) 10 MG tablet [Pharmacy Med Name: Montelukast Sodium 10 MG Oral Tablet] 30 tablet  0    Sig: Take 1 tablet by mouth once daily     Pulmonology:  Leukotriene Inhibitors Passed - 04/24/2021  9:32 AM      Passed - Valid encounter within last 12 months    Recent Outpatient Visits           2 months ago No-show for appointment   [facility], Dionne Bucy, MD   6 months ago Viral URI   [facility], Dionne Bucy, MD   7 months ago Pre-op examination   [facility], Laurita Quint, FNP   8 months ago History of pneumonia   [place], [place]   10 months ago Acute hypoxemic respiratory failure due to COVID-19 [facility])   [facility], [place], [place]

## 2021-04-24 NOTE — Telephone Encounter (Signed)
Requested medication (s) are due for refill today:   Yes  Requested medication (s) are on the active medication list:   Yes  Future visit scheduled:   Yes 05/17/2021  Was a No Show for prior appt.   Last ordered: 04/05/2021 #30, 0 refills  Returned because no further refills until seen at appt per note.   Courtesy refill was given in Oct.   Provider to review for further refills prior to appt.   Requested Prescriptions  Pending Prescriptions Disp Refills   pantoprazole (PROTONIX) 40 MG tablet [Pharmacy Med Name: Pantoprazole Sodium 40 MG Oral Tablet Delayed Release] 30 tablet 0    Sig: TAKE 1 TABLET BY MOUTH ONCE DAILY -  PLEASE  ATTEND  SCHEDULED  APPT  FOR  FURTHER  REFILLS     Gastroenterology: Proton Pump Inhibitors Passed - 04/24/2021  9:32 AM      Passed - Valid encounter within last 12 months    Recent Outpatient Visits           2 months ago No-show for appointment   [facility], Dionne Bucy, MD   6 months ago Viral URI   [facility] [place], Dionne Bucy, MD   7 months ago Pre-op examination   [facility], Laurita Quint, FNP   8 months ago History of pneumonia   [place], [place]   10 months ago Acute hypoxemic respiratory failure due to COVID-19 [facility])   [facility] [place], [place], [place]

## 2021-04-26 ENCOUNTER — Telehealth: Payer: Medicare Other

## 2021-04-28 IMAGING — CT CT HEAD W/O CM
3 series · 16 of 47 positions shown, 19 images · non-contrast
Comparison: 08/02/2016

CLINICAL DATA: Headaches

EXAM:
CT HEAD WITHOUT CONTRAST
TECHNIQUE: Contiguous axial images were obtained from the base of the skull
through the vertex without intravenous contrast.

[Series 2: head wo · axial · 0.41mm/px · z∈[-160,-35]mm · 10 of 31 slices shown, 13 images]
[im 3/31  brain]
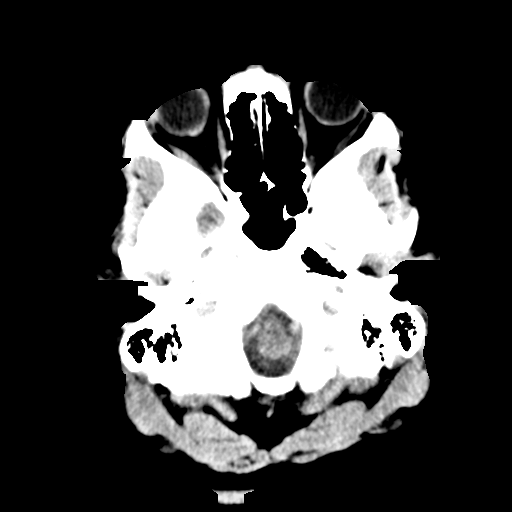
[im 3/31  bone]
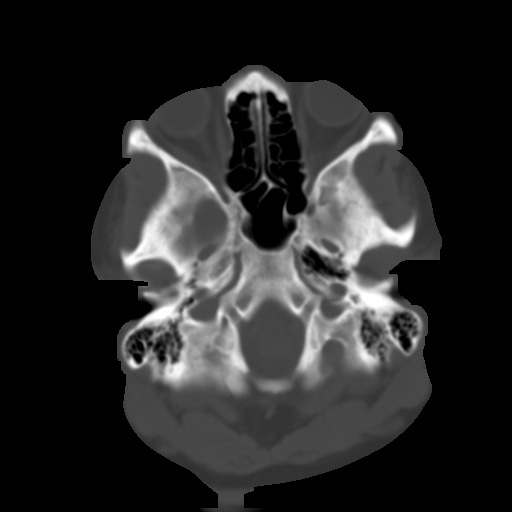
[im 6/31  brain]
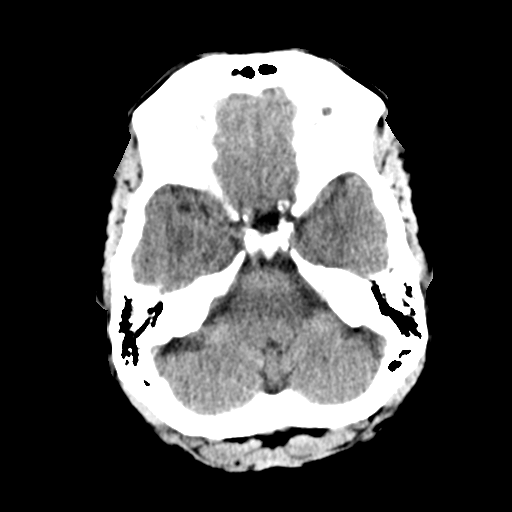
[im 9/31  brain]
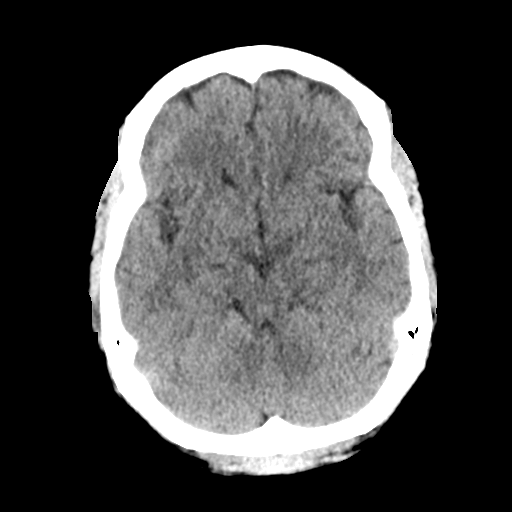
[im 11/31  brain]
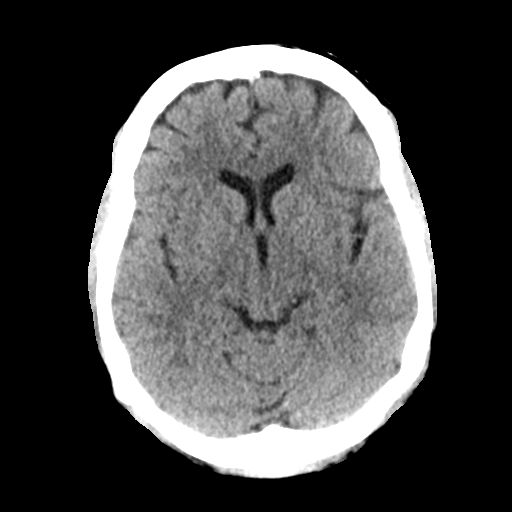
[im 14/31  brain]
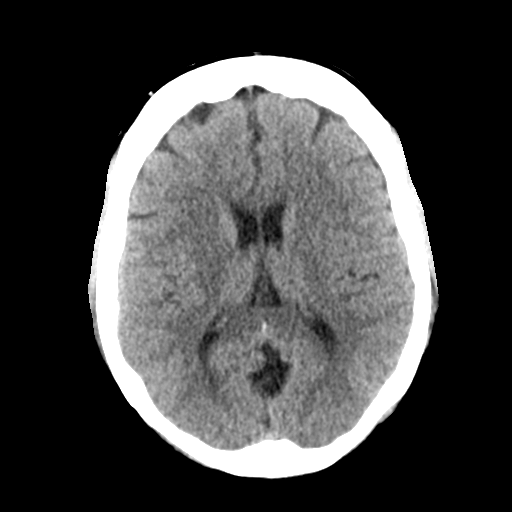
[im 14/31  bone]
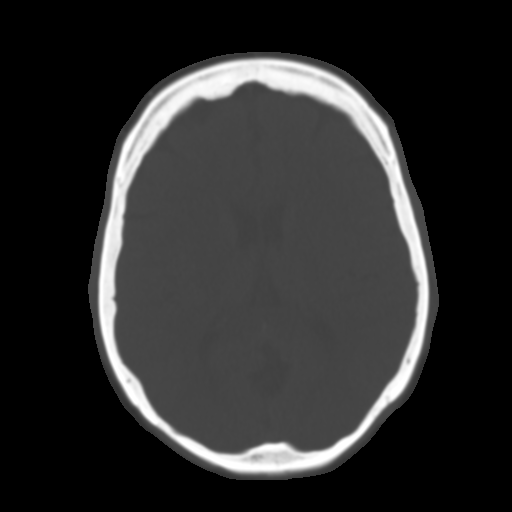
[im 17/31  brain]
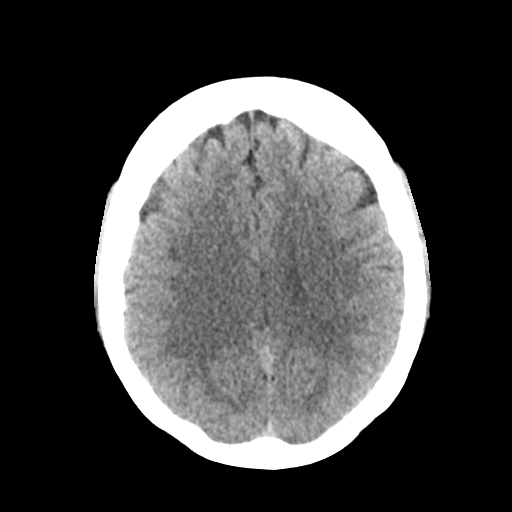
[im 20/31  brain]
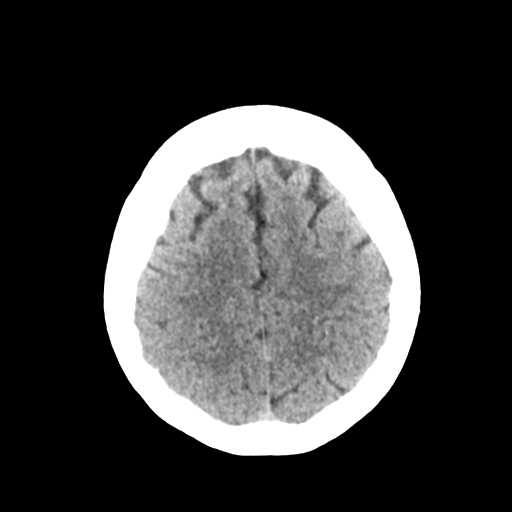
[im 23/31  brain]
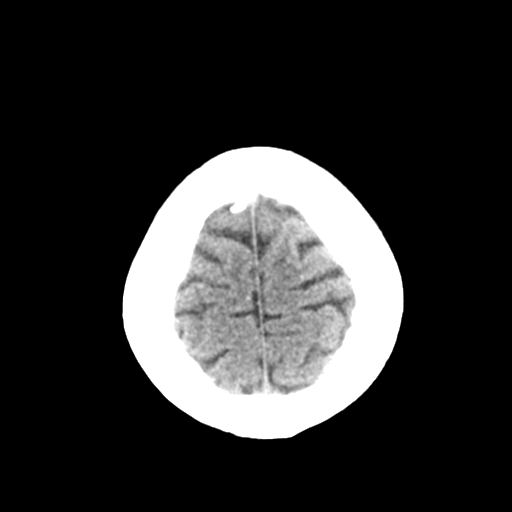
[im 25/31  brain]
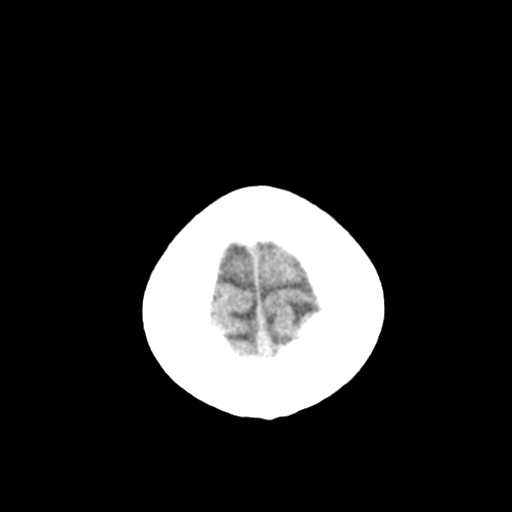
[im 25/31  bone]
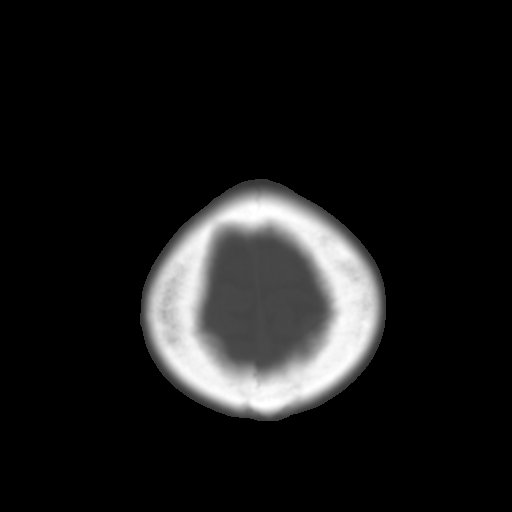
[im 28/31  brain]
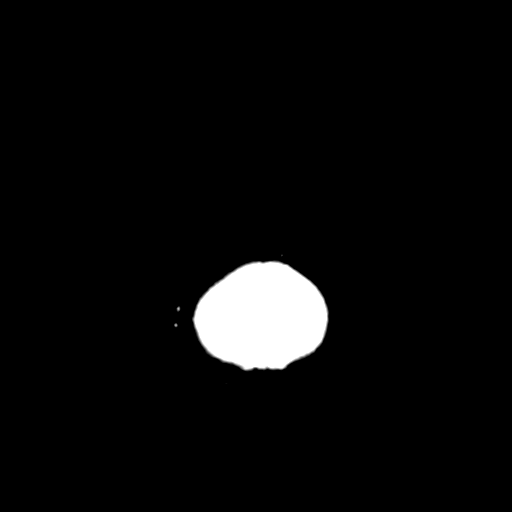

[Series 4: coronal soft tissue · coronal · 0.31mm/px · 3 of 62 slices shown]
[im 21/62  brain]
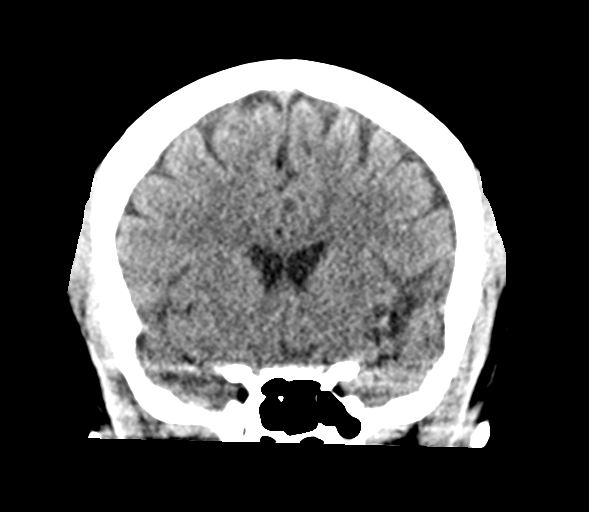
[im 28/62  brain]
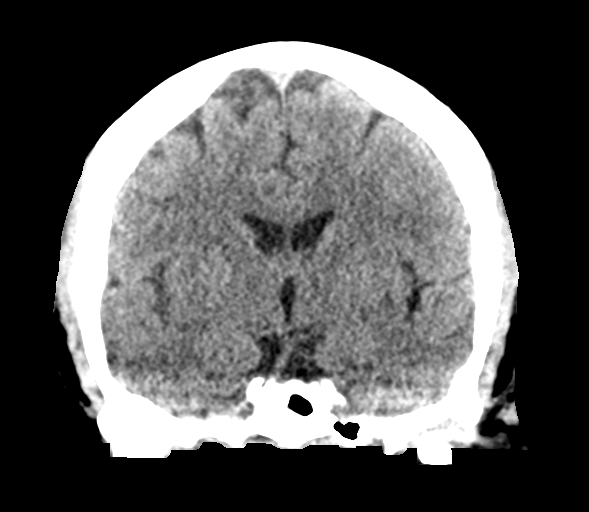
[im 34/62  brain]
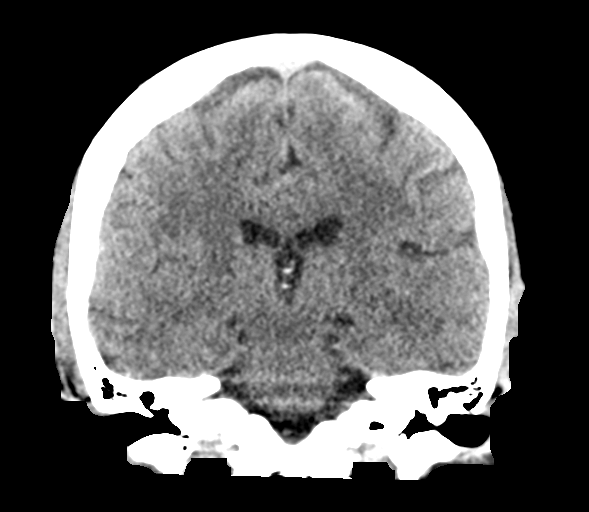

[Series 5: sagittal soft tissue · sagittal · 0.30mm/px · 3 of 56 slices shown]
[im 19/56  brain]
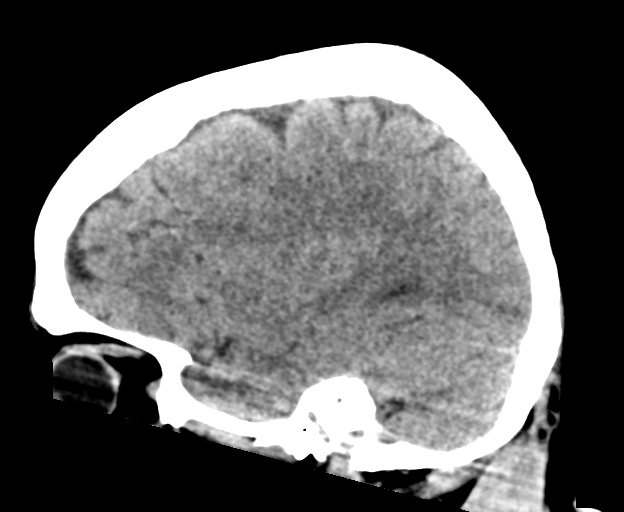
[im 28/56  brain]
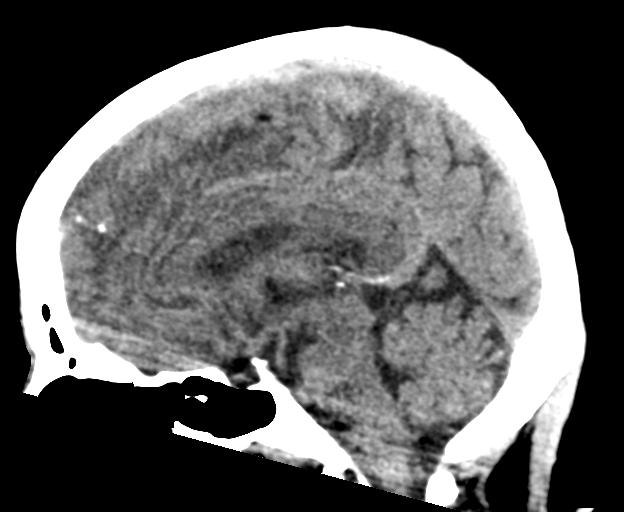
[im 37/56  brain]
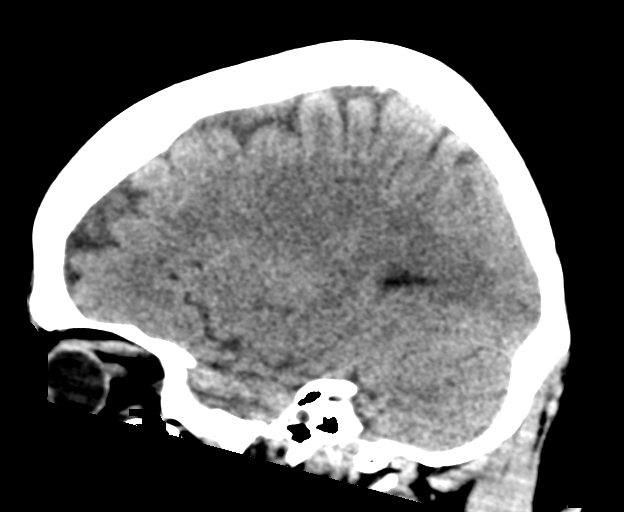

[16 of 47 positions shown; findings below may reference images not displayed]

FINDINGS: Brain: No evidence of acute infarction, hemorrhage, hydrocephalus,
extra-axial collection or mass lesion/mass effect.

Vascular: No hyperdense vessel or unexpected calcification.

Skull: No osseous abnormality.

Sinuses/Orbits: Visualized paranasal sinuses are clear. Visualized
mastoid sinuses are clear. Visualized orbits demonstrate no focal
abnormality.

Other: None
IMPRESSION: No acute intracranial pathology.

## 2021-04-30 NOTE — Patient Instructions (Signed)
Visit Information It was great speaking with you today!  Please let me know if you have any questions about our visit.   Goals Addressed             This Visit's Progress    Monitor and Manage My Blood Sugar-Diabetes Type 2   On track    Timeframe:  Long-Range Goal Priority:  High Start Date:    07/23/2020                         Expected End Date:   01/20/2022                    Follow Up within 90 days    - check blood sugar at prescribed times - check blood sugar if I feel it is too high or too low - take the blood sugar log to all doctor visits    Why is this important?   Checking your blood sugar at home helps to keep it from getting very high or very low.  Writing the results in a diary or log helps the doctor know how to care for you.  Your blood sugar log should have the time, date and the results.  Also, write down the amount of insulin or other medicine that you take.  Other information, like what you ate, exercise done and how you were feeling, will also be helpful.     Notes:         Patient Care Plan: General Pharmacy (Adult)     Problem Identified: Hypertension, Hyperlipidemia, Diabetes, GERD, Asthma and Osteoarthritis   Priority: High     Long-Range Goal: Patient-Specific Goal   Start Date: 07/23/2020  Expected End Date: 01/25/2022  This Visit's Progress: On track  Recent Progress: On track  Priority: High  Note:   Current Barriers:  Unable to independently afford treatment regimen  Pharmacist Clinical Goal(s):  Over the next 90 days, patient will verbalize ability to afford treatment regimen maintain control of Diabetes as evidenced by A1c less than 8%  through collaboration with PharmD and provider.    Interventions: 1:1 collaboration with Lavon Paganini, MD regarding development and update of comprehensive plan of care as evidenced by provider attestation and co-signature Inter-disciplinary care team collaboration (see longitudinal plan of  care) Comprehensive medication review performed; medication list updated in electronic medical record  Hypertension (BP goal <140/90) -controlled -Current treatment: Lisinopril-HCTZ 20-12.5 mg 2 tablets daily  Spironolactone 25 mg daily  Verapamil 80 mg daily  -Medications previously tried: Amlodipine, spironolactone   -Current home readings: NA -Current dietary habits: Limiting fried food. Eating more vegetables  -Current exercise habits: PT weekly -Denies hypotensive/hypertensive symptoms -Educated on Daily salt intake goal < 2300 mg; -Counseled to monitor BP at home if feeling symptomatic, document, and provide log at future appointments -Recommended to continue current medication  Hyperlipidemia: (LDL goal < 70) -uncontrolled -Current treatment: Rosuvastatin 10 mg daily -Medications previously tried: Pravastatin, Lovastatin Importance of limiting foods high in cholesterol; -Recommended rechecking lipid panel    Diabetes (A1c goal <8%) -Managed by Mee Hives -Controlled -Current medications: Glipizide 10 mg twice daily  Rybelsus 7 mg daily  -Medications previously tried: Januvia, Metformin (diarrhea), Pioglitazone (Edema), Trulicity  -Current home glucose readings fasting glucose: 170-210s  -Denies hypoglycemic/hyperglycemic symptoms -Patient takes glipizide at bedtime with snack. Counseled to adjust timing to dinner and avoiding late night snack. Patient sometimes feels she has to eat to avoid hypoglycemia  due to her glipizide.  -Could consider increasing Rybelsus to 14 mg daily and decreasing glipizide 5 mg twice daily to minimize risk of hypoglycemia.   Asthma (Goal: control symptoms and prevent exacerbations) -controlled -Current treatment  Ventolin HFA 108 mcg/act 2 puff every 6 hours as needed  Montelukast 10 mg daily  -Medications previously tried: NA  -Pulmonary function testing: NA -Exacerbations requiring treatment in last 6 months: Yes, due to recent  covid infection -Patient denies consistent use of maintenance inhaler -Frequency of rescue inhaler use: twice weekly -Counseled on Proper inhaler technique; When to use rescue inhaler -Recommended to continue current medication  GERD (Goal: prevent symptoms of heartburn and reflux) -controlled -Current treatment  Pantoprazole 40 mg daily  -Medications previously tried: NA  -Recommended to continue current medication  Osteoarthritis (Goal: minimize arthritis pain) -Controlled -Current treatment  Acetaminophen CR 650 mg every 8 hours as needed Ibuprofen 200 mg 2 tablets every 6 hours as needed  -Counseled to avoid greater than 3000 mg of acetaminophen daily  -Recommended to continue current medication  Patient Goals/Self-Care Activities Over the next 90 days, patient will:  - check glucose daily (before breakfast), document, and provide at future appointments  Follow Up Plan: Telephone follow up appointment with care management team member scheduled for:  10/21/2021 at 3:45 PM    Patient agreed to services and verbal consent obtained.   The patient verbalized understanding of instructions, educational materials, and care plan provided today and declined offer to receive copy of patient instructions, educational materials, and care plan.   [name], PharmD, [place], CPP  Clinical Pharmacist Practitioner  [facility] [phone]

## 2021-05-17 ENCOUNTER — Other Ambulatory Visit: Payer: Self-pay

## 2021-05-17 ENCOUNTER — Encounter: Payer: Self-pay | Admitting: Family Medicine

## 2021-05-17 ENCOUNTER — Ambulatory Visit (INDEPENDENT_AMBULATORY_CARE_PROVIDER_SITE_OTHER): Payer: Medicare Other | Admitting: Family Medicine

## 2021-05-17 VITALS — BP 139/75 | HR 71 | Resp 15 | Ht 59.5 in | Wt 232.3 lb

## 2021-05-17 DIAGNOSIS — Z113 Encounter for screening for infections with a predominantly sexual mode of transmission: Secondary | ICD-10-CM

## 2021-05-17 DIAGNOSIS — D508 Other iron deficiency anemias: Secondary | ICD-10-CM

## 2021-05-17 DIAGNOSIS — Z23 Encounter for immunization: Secondary | ICD-10-CM | POA: Diagnosis not present

## 2021-05-17 DIAGNOSIS — Z1231 Encounter for screening mammogram for malignant neoplasm of breast: Secondary | ICD-10-CM | POA: Diagnosis not present

## 2021-05-17 DIAGNOSIS — E1165 Type 2 diabetes mellitus with hyperglycemia: Secondary | ICD-10-CM | POA: Diagnosis not present

## 2021-05-17 DIAGNOSIS — I10 Essential (primary) hypertension: Secondary | ICD-10-CM

## 2021-05-17 DIAGNOSIS — B079 Viral wart, unspecified: Secondary | ICD-10-CM | POA: Diagnosis not present

## 2021-05-17 DIAGNOSIS — Z853 Personal history of malignant neoplasm of breast: Secondary | ICD-10-CM

## 2021-05-17 DIAGNOSIS — M79675 Pain in left toe(s): Secondary | ICD-10-CM

## 2021-05-17 DIAGNOSIS — Z0000 Encounter for general adult medical examination without abnormal findings: Secondary | ICD-10-CM | POA: Insufficient documentation

## 2021-05-17 DIAGNOSIS — E1169 Type 2 diabetes mellitus with other specified complication: Secondary | ICD-10-CM

## 2021-05-17 DIAGNOSIS — E1143 Type 2 diabetes mellitus with diabetic autonomic (poly)neuropathy: Secondary | ICD-10-CM

## 2021-05-17 DIAGNOSIS — E7800 Pure hypercholesterolemia, unspecified: Secondary | ICD-10-CM

## 2021-05-17 DIAGNOSIS — L299 Pruritus, unspecified: Secondary | ICD-10-CM

## 2021-05-17 DIAGNOSIS — E785 Hyperlipidemia, unspecified: Secondary | ICD-10-CM

## 2021-05-17 DIAGNOSIS — M79674 Pain in right toe(s): Secondary | ICD-10-CM

## 2021-05-17 DIAGNOSIS — Z124 Encounter for screening for malignant neoplasm of cervix: Secondary | ICD-10-CM | POA: Insufficient documentation

## 2021-05-17 DIAGNOSIS — L853 Xerosis cutis: Secondary | ICD-10-CM

## 2021-05-17 DIAGNOSIS — B351 Tinea unguium: Secondary | ICD-10-CM

## 2021-05-17 MED ORDER — ZOSTER VAC RECOMB ADJUVANTED 50 MCG/0.5ML IM SUSR: 0.5000 mL | Freq: Once | INTRAMUSCULAR | 0 refills | Status: AC

## 2021-05-17 NOTE — Progress Notes (Signed)
Annual Wellness Visit     Patient: Alexis Lloyd, Female    DOB: Feb 03, 1961, 60 y.o.   MRN: [id number] Visit Date: 05/17/2021  Today's Provider: Gwyneth Sprout, FNP   Chief Complaint  Patient presents with   Medicare Wellness   Subjective      HPI Alexis Lloyd is a 60 y.o. female who presents today for her Annual Wellness Visit. She reports consuming a general diet. The patient does not participate in regular exercise at present. She generally feels fairly well. She reports sleeping fairly well. She does have additional problems to discuss today. Patient reports LLQ pain that was intermittent for 3 days last week which she describes as a dull ache and patient also complains of a rash on her upper back that has been present for 2 weeks which patient describes as very itchy.  Last Reported Pap-08/31/17 Mammogram-02/20/16 Colonoscopy-09/11/17  Medications: Outpatient Medications Prior to Visit  Medication Sig   albuterol (VENTOLIN HFA) 108 (90 Base) MCG/ACT inhaler INHALE 2 PUFFS BY MOUTH EVERY 6 HOURS AS NEEDED   ascorbic Acid (VITAMIN C) 500 MG CPCR Take 500 mg by mouth daily.   aspirin EC 81 MG tablet Take 1 tablet (81 mg total) by mouth daily. Swallow whole.   Blood Glucose Monitoring Suppl (ONETOUCH VERIO) w/Device KIT To check blood sugar once daily   cetirizine (ZYRTEC) 10 MG tablet Take 1 tablet (10 mg total) by mouth daily.   Cholecalciferol (VITAMIN D3) 50 MCG (2000 UT) TABS Take 2,000 Units by mouth.   COVID-19 mRNA bivalent vaccine, Pfizer, (PFIZER COVID-19 VAC BIVALENT) injection Inject into the muscle.   Cyanocobalamin (VITAMIN B-12) 3000 MCG SUBL Place 1 tablet under the tongue daily at 6 (six) AM. Gummies   glipiZIDE (GLUCOTROL) 10 MG tablet TAKE 1 TABLET BY MOUTH  TWICE DAILY BEFORE A MEAL   glucose blood (ONETOUCH VERIO) test strip To check blood sugar once daily   HYDROcodone-acetaminophen (NORCO/VICODIN) 5-325 MG tablet Take 1 tablet by mouth every 6 (six)  hours as needed.   ibuprofen (ADVIL) 200 MG tablet Take 400 mg by mouth every 6 (six) hours as needed.   Lancets (ONETOUCH ULTRASOFT) lancets To check BS once daily   lisinopril-hydrochlorothiazide (ZESTORETIC) 20-12.5 MG tablet Take 2 tablets by mouth once daily   montelukast (SINGULAIR) 10 MG tablet Take 1 tablet by mouth once daily   pantoprazole (PROTONIX) 40 MG tablet TAKE 1 TABLET BY MOUTH ONCE DAILY -  PLEASE  ATTEND  SCHEDULED  APPT  FOR  FURTHER  REFILLS   rosuvastatin (CRESTOR) 10 MG tablet Take 1 tablet (10 mg total) by mouth daily.   Semaglutide (RYBELSUS) 7 MG TABS Take 7 mg by mouth daily before breakfast.   spironolactone (ALDACTONE) 25 MG tablet Take 1 tablet (25 mg total) by mouth daily.   SUMAtriptan (IMITREX) 50 MG tablet Take 1 tablet (50 mg total) by mouth every 2 (two) hours as needed for migraine. No more than 238m in 24 hours   verapamil (CALAN) 80 MG tablet TAKE 1 TABLET BY MOUTH  TWICE DAILY   zinc gluconate 50 MG tablet Take 50 mg by mouth daily.   No facility-administered medications prior to visit.    Allergies  Allergen Reactions   Allegra [Fexofenadine] Nausea And Vomiting   Avelox [Moxifloxacin] Other (See Comments)    Hallucinations   Bactrim [Sulfamethoxazole-Trimethoprim] Other (See Comments)    unknown   Etodolac Nausea Only   Fish-Derived Products Nausea And Vomiting  With "seafood"   Penicillins Diarrhea and Nausea And Vomiting   Shellfish Allergy Nausea Only   Sulfa Antibiotics Other (See Comments)    unknown   Macrobid [Nitrofurantoin Monohyd Macro] Other (See Comments)    Constipation and globus hystericus.    Patient Care Team: Gwyneth Sprout, FNP as PCP - General (Family Medicine) Imagene Riches, CNM as Midwife (Obstetrics) Germaine Pomfret, [facility] (Pharmacist) Lonia Farber, MD as Consulting Physician (Endocrinology)  Review of Systems  All other systems reviewed and are negative.      Objective    Vitals: BP 139/75    Pulse 71   Resp 15   Ht 4' 11.5" (1.511 m)   Wt 232 lb 4.8 oz (105.4 kg)   SpO2 99%   BMI 46.13 kg/m    Physical Exam Vitals and nursing note reviewed.  Constitutional:      General: She is awake. She is not in acute distress.    Appearance: Normal appearance. She is well-developed and well-groomed. She is obese. She is not ill-appearing, toxic-appearing or diaphoretic.  HENT:     Head: Normocephalic and atraumatic.     Jaw: There is normal jaw occlusion. No trismus, tenderness, swelling or pain on movement.     Right Ear: Hearing, tympanic membrane, ear canal and external ear normal. There is no impacted cerumen.     Left Ear: Hearing, tympanic membrane, ear canal and external ear normal. There is no impacted cerumen.     Nose: Nose normal. No congestion or rhinorrhea.     Right Turbinates: Not enlarged, swollen or pale.     Left Turbinates: Not enlarged, swollen or pale.     Right Sinus: No maxillary sinus tenderness or frontal sinus tenderness.     Left Sinus: No maxillary sinus tenderness or frontal sinus tenderness.     Mouth/Throat:     Lips: Pink.     Mouth: Mucous membranes are moist. No injury.     Tongue: No lesions.     Pharynx: Oropharynx is clear. Uvula midline. No pharyngeal swelling, oropharyngeal exudate, posterior oropharyngeal erythema or uvula swelling.     Tonsils: No tonsillar exudate or tonsillar abscesses.     Comments: Multiple teeth missing; has new dental plan Plans to work with dentist come January 2023 in hopes of doing some work and getting a bridge/partial Denies difficulty eating Eyes:     General: Lids are normal. Lids are everted, no foreign bodies appreciated. Vision grossly intact. Gaze aligned appropriately. No allergic shiner or visual field deficit.       Right eye: No discharge.        Left eye: No discharge.     Extraocular Movements: Extraocular movements intact.     Conjunctiva/sclera: Conjunctivae normal.     Right eye: Right  conjunctiva is not injected. No exudate.    Left eye: Left conjunctiva is not injected. No exudate.    Pupils: Pupils are equal, round, and reactive to light.     Comments: Was recently seen at [facility]; denies diabetic retinopathy; however, records not present today at exam  Neck:     Thyroid: No thyroid mass, thyromegaly or thyroid tenderness.     Vascular: No carotid bruit.     Trachea: Trachea normal.  Cardiovascular:     Rate and Rhythm: Normal rate and regular rhythm.     Pulses: Normal pulses.          Carotid pulses are 2+ on the right  side and 2+ on the left side.      Radial pulses are 2+ on the right side and 2+ on the left side.       Dorsalis pedis pulses are 2+ on the right side and 2+ on the left side.       Posterior tibial pulses are 2+ on the right side and 2+ on the left side.     Heart sounds: Normal heart sounds, S1 normal and S2 normal. No murmur heard.   No friction rub. No gallop.  Pulmonary:     Effort: Pulmonary effort is normal. No respiratory distress.     Breath sounds: Normal breath sounds and air entry. No stridor. No wheezing, rhonchi or rales.  Chest:     Chest wall: No tenderness.     Comments: Breasts: breasts appear normal, no suspicious masses, no skin or nipple changes or axillary nodes, right breast normal without mass, skin or nipple changes or axillary nodes, left breast normal without mass, skin or nipple changes or axillary nodes, risk and benefit of breast self-exam was discussed  Abdominal:     General: Abdomen is flat. Bowel sounds are normal. There is no distension.     Palpations: Abdomen is soft. There is no mass.     Tenderness: There is no abdominal tenderness. There is no right CVA tenderness, left CVA tenderness, guarding or rebound.     Hernia: No hernia is present.  Genitourinary:    General: Normal vulva.     Exam position: Lithotomy position.     Pubic Area: No rash or pubic lice.      Tanner stage (genital): 5.      Vagina: Vaginal discharge present.     Uterus: Absent.      Comments: Reported that spouse is likely cheating on her;  reports of occ yeast infections d/t DM  past hx of BV- which was treated with Abx Refused internal exam Musculoskeletal:        General: No swelling, tenderness, deformity or signs of injury. Normal range of motion.     Cervical back: Full passive range of motion without pain, normal range of motion and neck supple. No edema, rigidity or tenderness. No muscular tenderness.     Right lower leg: No edema.     Left lower leg: No edema.  Lymphadenopathy:     Cervical: No cervical adenopathy.     Right cervical: No superficial, deep or posterior cervical adenopathy.    Left cervical: No superficial, deep or posterior cervical adenopathy.  Skin:    General: Skin is warm and dry.     Capillary Refill: Capillary refill takes less than 2 seconds.     Coloration: Skin is not jaundiced or pale.     Findings: No bruising, erythema, lesion or rash.  Neurological:     General: No focal deficit present.     Mental Status: She is alert and oriented to person, place, and time. Mental status is at baseline.     GCS: GCS eye subscore is 4. GCS verbal subscore is 5. GCS motor subscore is 6.     Sensory: Sensation is intact. No sensory deficit.     Motor: Motor function is intact. No weakness.     Coordination: Coordination is intact. Coordination normal.     Gait: Gait is intact. Gait normal.  Psychiatric:        Attention and Perception: Attention and perception normal.        Mood  and Affect: Mood and affect normal.        Speech: Speech normal.        Behavior: Behavior normal. Behavior is cooperative.        Thought Content: Thought content normal.        Cognition and Memory: Cognition and memory normal.        Judgment: Judgment normal.    Most recent functional status assessment: In your present state of health, do you have any difficulty performing the following  activities: 05/17/2021  Hearing? N  Vision? Y  Difficulty concentrating or making decisions? N  Walking or climbing stairs? N  Dressing or bathing? N  Doing errands, shopping? N  Some recent data might be hidden   Most recent fall risk assessment: Fall Risk  10/09/2020  Falls in the past year? 0  Number falls in past yr: 0  Injury with Fall? 0  Risk for fall due to : No Fall Risks  Follow up Falls evaluation completed    Most recent depression screenings: PHQ 2/9 Scores 05/17/2021 10/09/2020  PHQ - 2 Score 0 0  PHQ- 9 Score 0 0   Most recent cognitive screening: No flowsheet data found. Most recent Audit-C alcohol use screening Alcohol Use Disorder Test (AUDIT) 05/17/2021  1. How often do you have a drink containing alcohol? 0  2. How many drinks containing alcohol do you have on a typical day when you are drinking? 0  3. How often do you have six or more drinks on one occasion? 0  AUDIT-C Score 0  Alcohol Brief Interventions/Follow-up -   A score of 3 or more in women, and 4 or more in men indicates increased risk for alcohol abuse, EXCEPT if all of the points are from question 1   No results found for any visits on 05/17/21.  Assessment & Plan     Annual wellness visit done today including the all of the following: Reviewed patient's Family Medical History Reviewed and updated list of patient's medical providers Assessment of cognitive impairment was done Assessed patient's functional ability Established a written schedule for health screening [place] Completed and Reviewed  Exercise Activities and Dietary recommendations  Goals      Monitor and Manage My Blood Sugar-Diabetes Type 2     Timeframe:  Long-Range Goal Priority:  High Start Date:    07/23/2020                         Expected End Date:   01/20/2022                    Follow Up within 90 days    - check blood sugar at prescribed times - check blood sugar if I feel it is too high or  too low - take the blood sugar log to all doctor visits    Why is this important?   Checking your blood sugar at home helps to keep it from getting very high or very low.  Writing the results in a diary or log helps the doctor know how to care for you.  Your blood sugar log should have the time, date and the results.  Also, write down the amount of insulin or other medicine that you take.  Other information, like what you ate, exercise done and how you were feeling, will also be helpful.     Notes:  Immunization History  Administered Date(s) Administered   PFIZER Comirnaty(Gray Top)Covid-19 Tri-Sucrose Vaccine 08/15/2020   PFIZER(Purple Top)SARS-COV-2 Vaccination 07/31/2019, 09/13/2019   Pfizer Covid-19 Vaccine Bivalent Booster 50yr & up 03/28/2021   Pneumococcal Polysaccharide-23 06/22/2009, 08/06/2015   Tdap 08/31/2017    Health Maintenance  Topic Date Due   Zoster Vaccines- Shingrix (1 of 2) Never done   Pneumococcal Vaccine 14611Years old (3 - PCV) 08/05/2016   MAMMOGRAM  02/19/2018   PAP SMEAR-Modifier  08/31/2020   FOOT EXAM  10/23/2020   HEMOGLOBIN A1C  01/29/2021   OPHTHALMOLOGY EXAM  03/06/2021   INFLUENZA VACCINE  09/07/2021 (Originally 01/07/2021)   TETANUS/TDAP  09/01/2027   COLONOSCOPY (Pts 45-442yrInsurance coverage will need to be confirmed)  09/12/2027   COVID-19 Vaccine  Completed   Hepatitis C Screening  Completed   HIV Screening  Completed   HPV VACCINES  Aged Out     Discussed health benefits of physical activity, and encouraged her to engage in regular exercise appropriate for her age and condition.    Problem List Items Addressed This Visit       Cardiovascular and Mediastinum   Hypertension    Chronic, Borderline today Continue use of Ace Inhibitor and HCTZ combo Recommend weight reduction Recommend low-salt diet Recommend increased activity- pt reports getting a puppy and has been walking more since recent R knee sx.          Endocrine   Diabetic neuropathy ([place]   Normal foot exam today Encouraged to seek out podiatrist No breakdown on exam      Hyperlipidemia associated with type 2 diabetes mellitus ([place]   Remains on crestor Repeat lipid panel Recommend increase in diet of healthier fat choices- low fat meats, oils that are not solid at room temperature, nuts, seeds, fish- cod, halibut, salmon, and avocado. Exercise can also increase this number.  Supplemental omega 3's can be taken as well but are not as helpful as dietary/exercise changes.       Type 2 diabetes mellitus with hyperglycemia, without long-term current use of insulin (HCC)    [facility] that [facility] has been on blister packs for some medication through their program however, last time the medication she got was different We did not have any samples to provide for the patient to assist      Relevant Orders   Hemoglobin A1c   Comprehensive metabolic panel   Ambulatory referral to Podiatry     Musculoskeletal and Integument   Warts of foot   Relevant Medications   [facility]ine Ad[facility]   Other Relevant Orders   Ambulatory referral to Podiatry   Pain due to onychomycosis of toenails of both feet    Referral to podiatry       Relevant Medications   [facility]   Other Relevant Orders   Ambulatory referral to Podiatry   Itchy skin    Small circular patch in center of back Limited mobility to reach Refer to derm      Relevant Orders   Ambulatory referral to Dermatology   Dry skin dermatitis    Refer to derm Skin dry in folds d/t body habitus      Relevant Orders   Ambulatory referral to Dermatology     Other   Personal history of breast cancer    S/p excision, stg I  2013 L breast      Hypercholesteremia  recommend diet low in saturated fat and regular exercise - 30 min at least 5 times per week       Annual physical  exam - Primary    Things to do to keep yourself healthy  - Exercise at least 30-45 minutes a day, 3-4 days a week.  - Eat a low-fat diet with lots of fruits and vegetables, up to 7-9 servings per day.  - Seatbelts can save your life. Wear them always.  - Smoke detectors on every level of your home, check batteries every year.  - Eye Doctor - have an eye exam every 1-2 years  - Safe sex - if you may be exposed to STDs, use a condom.  - Alcohol -  If you drink, do it moderately, less than 2 drinks per day.  - [place]. Choose someone to speak for you if you are not able.  - Depression is common in our stressful world.If you're feeling down or losing interest in things you normally enjoy, please come in for a visit.  - Violence - If anyone is threatening or hurting you, please call immediately.        Screening for cervical cancer    Patient request; does not know if cervix is present Did not visualize on exam Reviewed last results- where it cells were absent Pt request for PAP for HPV check given unfaithfulness of spouse      Relevant Orders   Cytology - PAP   Encounter for screening mammogram for malignant neoplasm of breast    Denies current concerns Repeat mammogram screening      Relevant Orders   MM 3D SCREEN BREAST BILATERAL   Elevated LDL cholesterol level    Encourage increase in diet of healthier fat choices- low fat meats, oils that are not solid at room temperature, nuts, seeds, fish- cod, halibut, salmon, and avocado. Exercise can also increase this number.  Supplemental omega 3's can be taken as well but are not as helpful as dietary/exercise changes. -repeat lipid panel      Relevant Orders   Lipid panel   Iron deficiency anemia secondary to inadequate dietary iron intake    Repeat CBC Encouraged iron rich food      Relevant Orders   CBC with Differential/Platelet   Need for shingles vaccine    Written Rx provided Has a holiday party  schedule and doesn't want to be ill Education provided      Relevant Medications   Zoster Vaccine Adjuvanted [facility]) injection   Routine screening for STI (sexually transmitted infection)    Concern for unfaithfulness in spouse Request for STI testings Denies current symptoms Hx of BV      Relevant Orders   NuSwab Vaginitis Plus (VG+)     Return in about 6 months (around 11/15/2021) for chonic disease management, T2DM management.     Vonna Kotyk, FNP, have reviewed all documentation for this visit. The documentation on 05/17/21 for the exam, diagnosis, procedures, and orders are all accurate and complete.    Gwyneth Sprout, [place] [phone] (phone) [phone] (fax)  [place]

## 2021-05-17 NOTE — Assessment & Plan Note (Signed)
Followed by Endocrine at [facility]; patient reported that she has been on blister packs for some medication through their program however, last time the medication she got was different We did not have any samples to provide for the patient to assist

## 2021-05-17 NOTE — Assessment & Plan Note (Signed)
Concern for unfaithfulness in spouse Request for STI testings Denies current symptoms Hx of BV

## 2021-05-17 NOTE — Assessment & Plan Note (Signed)
S/p excision, stg I  2013 L breast

## 2021-05-17 NOTE — Assessment & Plan Note (Signed)
Denies current concerns Repeat mammogram screening

## 2021-05-17 NOTE — Assessment & Plan Note (Signed)
Chronic, Borderline today Continue use of Ace Inhibitor and HCTZ combo Recommend weight reduction Recommend low-salt diet Recommend increased activity- pt reports getting a puppy and has been walking more since recent R knee sx.

## 2021-05-17 NOTE — Assessment & Plan Note (Signed)
Written Rx provided Has a holiday party schedule and doesn't want to be ill Education provided

## 2021-05-17 NOTE — Assessment & Plan Note (Signed)
Normal foot exam today Encouraged to seek out podiatrist No breakdown on exam

## 2021-05-17 NOTE — Assessment & Plan Note (Signed)
Refer to derm Skin dry in folds d/t body habitus

## 2021-05-17 NOTE — Assessment & Plan Note (Signed)
Remains on crestor Repeat lipid panel Recommend increase in diet of healthier fat choices- low fat meats, oils that are not solid at room temperature, nuts, seeds, fish- cod, halibut, salmon, and avocado. Exercise can also increase this number.  Supplemental omega 3's can be taken as well but are not as helpful as dietary/exercise changes.

## 2021-05-17 NOTE — Assessment & Plan Note (Signed)
Referral to podiatry  

## 2021-05-17 NOTE — Assessment & Plan Note (Signed)
recommend diet low in saturated fat and regular exercise - 30 min at least 5 times per week

## 2021-05-17 NOTE — Assessment & Plan Note (Signed)
Repeat CBC Encouraged iron rich food

## 2021-05-17 NOTE — Assessment & Plan Note (Signed)
Encourage increase in diet of healthier fat choices- low fat meats, oils that are not solid at room temperature, nuts, seeds, fish- cod, halibut, salmon, and avocado. Exercise can also increase this number.  Supplemental omega 3's can be taken as well but are not as helpful as dietary/exercise changes. -repeat lipid panel

## 2021-05-17 NOTE — Assessment & Plan Note (Signed)
Patient request; does not know if cervix is present Did not visualize on exam Reviewed last results- where it cells were absent Pt request for PAP for HPV check given unfaithfulness of spouse

## 2021-05-17 NOTE — Assessment & Plan Note (Signed)

## 2021-05-17 NOTE — Assessment & Plan Note (Signed)
Small circular patch in center of back Limited mobility to reach Refer to derm

## 2021-05-18 LAB — CBC WITH DIFFERENTIAL/PLATELET
Basophils Absolute: 0.1 10*3/uL (ref 0.0–0.2)
Basos: 1 %
EOS (ABSOLUTE): 0.1 10*3/uL (ref 0.0–0.4)
Eos: 1 %
Hematocrit: 34.6 % (ref 34.0–46.6)
Hemoglobin: 11.6 g/dL (ref 11.1–15.9)
Immature Grans (Abs): 0 10*3/uL (ref 0.0–0.1)
Immature Granulocytes: 1 %
Lymphocytes Absolute: 2.4 10*3/uL (ref 0.7–3.1)
Lymphs: 34 %
MCH: 28.4 pg (ref 26.6–33.0)
MCHC: 33.5 g/dL (ref 31.5–35.7)
MCV: 85 fL (ref 79–97)
Monocytes Absolute: 0.4 10*3/uL (ref 0.1–0.9)
Monocytes: 5 %
Neutrophils Absolute: 4.2 10*3/uL (ref 1.4–7.0)
Neutrophils: 58 %
Platelets: 315 10*3/uL (ref 150–450)
RBC: 4.08 x10E6/uL (ref 3.77–5.28)
RDW: 12.7 % (ref 11.7–15.4)
WBC: 7.2 10*3/uL (ref 3.4–10.8)

## 2021-05-18 LAB — LIPID PANEL
Chol/HDL Ratio: 5.5 ratio — ABNORMAL HIGH (ref 0.0–4.4)
Cholesterol, Total: 148 mg/dL (ref 100–199)
HDL: 27 mg/dL — ABNORMAL LOW (ref >39)
LDL Chol Calc (NIH): 95 mg/dL (ref 0–99)
Triglycerides: 146 mg/dL (ref 0–149)
VLDL Cholesterol Cal: 26 mg/dL (ref 5–40)

## 2021-05-18 LAB — COMPREHENSIVE METABOLIC PANEL
ALT: 14 IU/L (ref 0–32)
AST: 6 IU/L (ref 0–40)
Albumin/Globulin Ratio: 1.6 (ref 1.2–2.2)
Albumin: 4.3 g/dL (ref 3.8–4.8)
Alkaline Phosphatase: 103 IU/L (ref 44–121)
BUN/Creatinine Ratio: 22 (ref 12–28)
BUN: 16 mg/dL (ref 8–27)
Bilirubin Total: 0.2 mg/dL (ref 0.0–1.2)
CO2: 25 mmol/L (ref 20–29)
Calcium: 10.1 mg/dL (ref 8.7–10.3)
Chloride: 101 mmol/L (ref 96–106)
Creatinine, Ser: 0.73 mg/dL (ref 0.57–1.00)
Globulin, Total: 2.7 g/dL (ref 1.5–4.5)
Glucose: 166 mg/dL — ABNORMAL HIGH (ref 70–99)
Potassium: 4.3 mmol/L (ref 3.5–5.2)
Sodium: 142 mmol/L (ref 134–144)
Total Protein: 7 g/dL (ref 6.0–8.5)
eGFR: 93 mL/min/{1.73_m2} (ref >59)

## 2021-05-18 LAB — HEMOGLOBIN A1C
Est. average glucose Bld gHb Est-mCnc: 214 mg/dL
Hgb A1c MFr Bld: 9.1 % — ABNORMAL HIGH (ref 4.8–5.6)

## 2021-05-20 ENCOUNTER — Other Ambulatory Visit: Payer: Self-pay | Admitting: Family Medicine

## 2021-05-20 DIAGNOSIS — B9689 Other specified bacterial agents as the cause of diseases classified elsewhere: Secondary | ICD-10-CM

## 2021-05-20 DIAGNOSIS — N760 Acute vaginitis: Secondary | ICD-10-CM

## 2021-05-20 LAB — NUSWAB VAGINITIS PLUS (VG+)
Candida albicans, NAA: NEGATIVE
Candida glabrata, NAA: NEGATIVE
Chlamydia trachomatis, NAA: NEGATIVE
Megasphaera 1: HIGH Score — AB
Neisseria gonorrhoeae, NAA: NEGATIVE
Trich vag by NAA: NEGATIVE

## 2021-05-20 MED ORDER — METRONIDAZOLE 500 MG PO TABS: 500.0000 mg | ORAL_TABLET | Freq: Two times a day (BID) | ORAL | 0 refills | Status: DC

## 2021-05-20 MED ORDER — METFORMIN HCL ER 750 MG PO TB24: 750.0000 mg | ORAL_TABLET | Freq: Two times a day (BID) | ORAL | 3 refills | Status: DC

## 2021-05-20 NOTE — Progress Notes (Signed)
Hello,    Your lab results have returned.  It was a pleasure to see you in the office the other day.  Anemia is stable at this time.  Cholesterol has improved; however, good/HDL cholesterol remains low. Recommend increase in diet of healthier fat choices- low fat meats, oils that are not solid at room temperature, nuts, seeds, fish- cod, halibut, salmon, and avocado. Exercise can also increase this number.  Supplemental omega 3's can be taken as well but are not as helpful as dietary/exercise changes.  Current risk of heart attack/stroke is The 10-year ASCVD risk score (Arnett DK, et al., 2019) is: 22.1% Please make sure you are taking your crestor each day.   Values used to calculate the score:     Age: 60 years     Sex: Female     Is Non-Hispanic African American: Yes     Diabetic: Yes     Tobacco smoker: No     Systolic Blood Pressure: 101 mmHg     Is BP treated: Yes     HDL Cholesterol: 27 mg/dL     Total Cholesterol: 148 mg/dL  Diabetes has progressed to 9.1%; please let endocrine know. I will reorder the metformin- add back into your day slowly, once with breakfast and then with breakfast and dinner. Continue to recommend balanced, lower carb meals. Smaller meal size, adding snacks. Choosing water as drink of choice and increasing purposeful exercise.   I will order some antibiotics for the bacterial vaginosis seen on your swab; do not drink alcohol when you take as it will make you violently ill.

## 2021-05-22 ENCOUNTER — Ambulatory Visit: Payer: Self-pay

## 2021-05-22 ENCOUNTER — Telehealth: Payer: Self-pay

## 2021-05-22 NOTE — Telephone Encounter (Signed)
Copied from [place] [id number]. Topic: General - Other >> May 22, 2021 10:15 AM Leward Quan A wrote: Reason for CRM: Patient called in to inform [name] that the Dermatologist she was referred to does take her insurance, Also patient say that she did not want to be referred to [place] so needing both referrals to be sent someplace else. Please advise Ph# [phone]

## 2021-05-22 NOTE — Telephone Encounter (Signed)
2nd attempt to reach pt. Left VM to call back to discuss

## 2021-05-22 NOTE — Telephone Encounter (Signed)
Patient called in and want to talk to the nurse about her being metroNIDAZOLE (FLAGYL) 500 MG tablet and she is not sure why. Please call Ph# [phone]    Left message to call back.

## 2021-05-22 NOTE — Telephone Encounter (Signed)
Alexis Lloyd can the same referral that was placed on 12/09 to [facility] be send to another place or do we need to put a new one?

## 2021-05-22 NOTE — Telephone Encounter (Signed)
Third attempt to reach pt. Message left. 

## 2021-05-22 NOTE — Telephone Encounter (Signed)
Reviewing back in chart Parke Poisson has made addendum to referral. Amparo Bristol

## 2021-05-22 NOTE — Telephone Encounter (Signed)
Informed pt. Flagyl was prescribed for vaginal bacteria. Verbalizes understanding. Answer Assessment - Initial Assessment Questions 1. NAME of MEDICATION: "What medicine are you calling about?"     Flagyl 2. QUESTION: "What is your question?" (e.g., double dose of medicine, side effect)     Why was this prescribed? 3. PRESCRIBING HCP: "Who prescribed it?" Reason: if prescribed by specialist, call should be referred to that group.     [place] 4. SYMPTOMS: "Do you have any symptoms?"     N/a 5. SEVERITY: If symptoms are present, ask "Are they mild, moderate or severe?"     N/a 6. PREGNANCY:  "Is there any chance that you are pregnant?" "When was your last menstrual period?"     No  Protocols used: Medication Question Call-A-AH

## 2021-05-23 LAB — CYTOLOGY - PAP: Adequacy: ABNORMAL

## 2021-06-05 ENCOUNTER — Other Ambulatory Visit (HOSPITAL_COMMUNITY): Payer: Self-pay

## 2021-06-07 ENCOUNTER — Telehealth: Payer: Self-pay | Admitting: Family Medicine

## 2021-06-07 DIAGNOSIS — K219 Gastro-esophageal reflux disease without esophagitis: Secondary | ICD-10-CM

## 2021-06-07 MED ORDER — PANTOPRAZOLE SODIUM 40 MG PO TBEC: 40.0000 mg | DELAYED_RELEASE_TABLET | Freq: Every day | ORAL | 0 refills | Status: DC

## 2021-06-07 NOTE — Telephone Encounter (Signed)
[facility] faxed refill request for the following medications:  pantoprazole (PROTONIX) 40 MG tablet   Please advise.

## 2021-06-11 ENCOUNTER — Telehealth: Payer: Self-pay

## 2021-06-11 NOTE — Telephone Encounter (Signed)
Copied from [place] [id number]. Topic: General - Other >> Jun 11, 2021 11:12 AM Erick Blinks wrote: Reason for CRM: Pt needs to pick up her certificate for her handicap placard. Please advise, she needs a permanent one because she has leg problems she says.  Best contact: [phone]

## 2021-06-12 NOTE — Telephone Encounter (Signed)
I placed form in your box to review, according to her recent visit with ou on 05/17/21 in your note your stated that patient reported she was getting a puppy and has been walking more since recent right knee symptoms. Unsure if you want this to be permanent or temprorary, I tried looking for parking placard in chart but did not see a previous one scanned in media, please review form. KW

## 2021-06-12 NOTE — Telephone Encounter (Signed)
Have you received this form? Or are you working on form? If not we have extra ones at the front desk I can fill out for you to sign and review. KW

## 2021-06-21 ENCOUNTER — Telehealth: Payer: Self-pay

## 2021-06-21 NOTE — Telephone Encounter (Signed)
Patient stopped by office to let you know that she is unable to get the Rybelsus through patient assistance because "something was left off the paperwork" and has asked that you call them to find what needs to be done so she can get her medicine.

## 2021-06-24 ENCOUNTER — Telehealth: Payer: Self-pay

## 2021-06-24 NOTE — Progress Notes (Signed)
Chronic Care Management Pharmacy Assistant   Name: Alexis Lloyd  MRN: [id number] DOB: 05-13-1961  Reason for Encounter: Medication Review/Patient assistance application update for Rybelsus.   Recent office visits:  05/17/2021 [name] FNP (PCP) Medicare Wellness completed, restart Metformin 750 mg 1 tablet 2 times daily, start metronidazole 500 mg 1 tablet 2 times daily, Ambulatory referral to [facility] referral to Dermatology,Return in about 6 months   Recent consult visits:  04/23/2021 Dr. Honor Junes MD (Endocrinology) No Medication Changes noted  Hospital visits:  None in previous 6 months  Medications: Outpatient Encounter Medications as of 06/24/2021  Medication Sig Note   albuterol (VENTOLIN HFA) 108 (90 Base) MCG/ACT inhaler INHALE 2 PUFFS BY MOUTH EVERY 6 HOURS AS NEEDED    ascorbic Acid (VITAMIN C) 500 MG CPCR Take 500 mg by mouth daily.    aspirin EC 81 MG tablet Take 1 tablet (81 mg total) by mouth daily. Swallow whole.    Blood Glucose Monitoring Suppl (ONETOUCH VERIO) w/Device KIT To check blood sugar once daily    cetirizine (ZYRTEC) 10 MG tablet Take 1 tablet (10 mg total) by mouth daily.    Cholecalciferol (VITAMIN D3) 50 MCG (2000 UT) TABS Take 2,000 Units by mouth.    COVID-19 mRNA bivalent vaccine, Pfizer, (PFIZER COVID-19 VAC BIVALENT) injection Inject into the muscle.    Cyanocobalamin (VITAMIN B-12) 3000 MCG SUBL Place 1 tablet under the tongue daily at 6 (six) AM. Gummies    glipiZIDE (GLUCOTROL) 10 MG tablet TAKE 1 TABLET BY MOUTH  TWICE DAILY BEFORE A MEAL    glucose blood (ONETOUCH VERIO) test strip To check blood sugar once daily    HYDROcodone-acetaminophen (NORCO/VICODIN) 5-325 MG tablet Take 1 tablet by mouth every 6 (six) hours as needed.    ibuprofen (ADVIL) 200 MG tablet Take 400 mg by mouth every 6 (six) hours as needed.    Lancets (ONETOUCH ULTRASOFT) lancets To check BS once daily    lisinopril-hydrochlorothiazide (ZESTORETIC)  20-12.5 MG tablet Take 2 tablets by mouth once daily    metFORMIN (GLUCOPHAGE XR) 750 MG 24 hr tablet Take 1 tablet (750 mg total) by mouth 2 (two) times daily with a meal.    metroNIDAZOLE (FLAGYL) 500 MG tablet Take 1 tablet (500 mg total) by mouth 2 (two) times daily.    montelukast (SINGULAIR) 10 MG tablet Take 1 tablet by mouth once daily    pantoprazole (PROTONIX) 40 MG tablet Take 1 tablet (40 mg total) by mouth daily.    rosuvastatin (CRESTOR) 10 MG tablet Take 1 tablet (10 mg total) by mouth daily.    Semaglutide (RYBELSUS) 7 MG TABS Take 7 mg by mouth daily before breakfast. 07/24/2020: Prescribed by [name], NP    spironolactone (ALDACTONE) 25 MG tablet Take 1 tablet (25 mg total) by mouth daily.    SUMAtriptan (IMITREX) 50 MG tablet Take 1 tablet (50 mg total) by mouth every 2 (two) hours as needed for migraine. No more than 257m in 24 hours    verapamil (CALAN) 80 MG tablet TAKE 1 TABLET BY MOUTH  TWICE DAILY    zinc gluconate 50 MG tablet Take 50 mg by mouth daily.    No facility-administered encounter medications on file as of 06/24/2021.    Care Gaps: Pneumococcal Vaccine Shingrix Vaccine Ophthalmology Exam (Last Completed 03/06/2020) Star Rating Drugs: Lisinopril-HCTZ 20-12.5 mg [facility] Glipizide 10 mg [facility]. Rosuvastatin 10 mg last filled on 04/24/2021 for [facility] day supply at [facility]. Rybelsus 7 mg last filled on 10/19/2020 for 90 day supply at [facility].(Receive assistance through [employer]) Metformin 750 mg last filled 05/20/2021 for 90 day supply at [facility]. Medication fill Gaps: None  Update on patient assistance renewal application for  Rybelsus:  I reach out to [employer] to check on the patient application for Rybelsus:   06/26/2021 I was on hold for 3 hours and 26 minutes , and then the line was disconnected. I  will try again on 06/27/2021.  06/27/2021 I was on hold for 1 hour and 42 minutes , and then the line was disconnected. Per Lynann Bologna CPA, she was able to ask about patient application status for [employer] the  entire application will need to be refaxed due to the dates does note match. The application was more than 57-Days old. The Clinical pharmacist will have to white out the old dates and put the new date when he fax the application.Make sure all dates match along with checking the Re-enrollment box on the application.Clinical pharmacist Notified.   [place] Pharmacist Assistant [phone]

## 2021-07-15 DIAGNOSIS — M2021 Hallux rigidus, right foot: Secondary | ICD-10-CM | POA: Diagnosis not present

## 2021-07-15 DIAGNOSIS — L821 Other seborrheic keratosis: Secondary | ICD-10-CM | POA: Diagnosis not present

## 2021-07-15 DIAGNOSIS — M79675 Pain in left toe(s): Secondary | ICD-10-CM | POA: Diagnosis not present

## 2021-07-15 DIAGNOSIS — M2022 Hallux rigidus, left foot: Secondary | ICD-10-CM | POA: Diagnosis not present

## 2021-07-15 DIAGNOSIS — E119 Type 2 diabetes mellitus without complications: Secondary | ICD-10-CM | POA: Diagnosis not present

## 2021-07-15 DIAGNOSIS — B351 Tinea unguium: Secondary | ICD-10-CM | POA: Diagnosis not present

## 2021-07-15 DIAGNOSIS — L600 Ingrowing nail: Secondary | ICD-10-CM | POA: Diagnosis not present

## 2021-07-15 DIAGNOSIS — M79674 Pain in right toe(s): Secondary | ICD-10-CM | POA: Diagnosis not present

## 2021-08-02 DIAGNOSIS — M1712 Unilateral primary osteoarthritis, left knee: Secondary | ICD-10-CM | POA: Diagnosis not present

## 2021-08-05 ENCOUNTER — Other Ambulatory Visit: Payer: Self-pay | Admitting: Physician Assistant

## 2021-08-05 DIAGNOSIS — I10 Essential (primary) hypertension: Secondary | ICD-10-CM

## 2021-08-06 ENCOUNTER — Telehealth: Payer: Self-pay | Admitting: Family Medicine

## 2021-08-06 MED ORDER — ROSUVASTATIN CALCIUM 10 MG PO TABS: 10.0000 mg | ORAL_TABLET | Freq: Every day | ORAL | 0 refills | Status: DC

## 2021-08-06 NOTE — Telephone Encounter (Signed)
[facility] faxed refill request for the following medications:  rosuvastatin (CRESTOR) 10 MG tablet   Please advise

## 2021-08-12 ENCOUNTER — Telehealth: Payer: Self-pay | Admitting: Family Medicine

## 2021-08-12 DIAGNOSIS — I10 Essential (primary) hypertension: Secondary | ICD-10-CM

## 2021-08-12 NOTE — Telephone Encounter (Signed)
Patient requesting a nurse reach out to the assistance program regarding assistance with covering Semaglutide (RYBELSUS) 7 MG TABS.  Patient checking on the status of spironolactone (ALDACTONE) 25 MG tablet and unclear of the other medication which starts with a "r" patient states its not the rosuvastatin (CRESTOR) 10 MG tablet because she got that already.   Patient states she has reached out to her pharmacy but the pharmacy has not heard back from the PCP office. Patient states she does not have old bottle to confirm the name of medication that starts with the letter "R".  Patient states she will run out of medication and would like PCP to send Rx to   [place] (N), [place] Phone:  [phone]  Fax:  [phone]

## 2021-08-13 NOTE — Telephone Encounter (Signed)
Pt called, LVMTCB to discuss medications needing for refill.  ?

## 2021-08-13 NOTE — Telephone Encounter (Signed)
Patient called, left VM to return the call to the office to speak to a nurse about clarification on the below refill requests. ?

## 2021-08-14 MED ORDER — VERAPAMIL HCL 80 MG PO TABS: 80.0000 mg | ORAL_TABLET | Freq: Two times a day (BID) | ORAL | 3 refills | Status: DC

## 2021-08-14 MED ORDER — SPIRONOLACTONE 25 MG PO TABS: 25.0000 mg | ORAL_TABLET | Freq: Every day | ORAL | 1 refills | Status: DC

## 2021-08-14 NOTE — Telephone Encounter (Signed)
Was able to succesfully reach patient. Patient stated that she needed a refill on Spirolactone and Verapamil sent to [facility], she states that she wanted to have our office contact her back in regards to medication assistance program, she states that she was speaking with our pharmacist in regards to Rybelsus. KW ?

## 2021-08-14 NOTE — Addendum Note (Signed)
Addended by: Minette Headland on: 08/14/2021 10:37 AM ? ? Modules accepted: Orders ? ?

## 2021-08-15 ENCOUNTER — Other Ambulatory Visit: Payer: Self-pay

## 2021-08-15 ENCOUNTER — Telehealth: Payer: Self-pay

## 2021-08-15 ENCOUNTER — Ambulatory Visit (INDEPENDENT_AMBULATORY_CARE_PROVIDER_SITE_OTHER): Payer: Medicare Other | Admitting: Family Medicine

## 2021-08-15 DIAGNOSIS — Z23 Encounter for immunization: Secondary | ICD-10-CM

## 2021-08-15 NOTE — Progress Notes (Signed)
Chronic Care Management Pharmacy Assistant   Name: Alexis Lloyd  MRN: [id number] DOB: 1960-08-20  Reason for Encounter: Medication Review/Patient assistance application renewal update for Rybelsus.   Recent office visits:  No recent office visit  Recent consult visits:  No recent consult visit  Hospital visits:  None in previous 6 months  Medications: Outpatient Encounter Medications as of 08/15/2021  Medication Sig Note   albuterol (VENTOLIN HFA) 108 (90 Base) MCG/ACT inhaler INHALE 2 PUFFS BY MOUTH EVERY 6 HOURS AS NEEDED    ascorbic Acid (VITAMIN C) 500 MG CPCR Take 500 mg by mouth daily.    aspirin EC 81 MG tablet Take 1 tablet (81 mg total) by mouth daily. Swallow whole.    Blood Glucose Monitoring Suppl (ONETOUCH VERIO) w/Device KIT To check blood sugar once daily    cetirizine (ZYRTEC) 10 MG tablet Take 1 tablet (10 mg total) by mouth daily.    Cholecalciferol (VITAMIN D3) 50 MCG (2000 UT) TABS Take 2,000 Units by mouth.    COVID-19 mRNA bivalent vaccine, Pfizer, (PFIZER COVID-19 VAC BIVALENT) injection Inject into the muscle.    Cyanocobalamin (VITAMIN B-12) 3000 MCG SUBL Place 1 tablet under the tongue daily at 6 (six) AM. Gummies    glipiZIDE (GLUCOTROL) 10 MG tablet TAKE 1 TABLET BY MOUTH  TWICE DAILY BEFORE A MEAL    glucose blood (ONETOUCH VERIO) test strip To check blood sugar once daily    HYDROcodone-acetaminophen (NORCO/VICODIN) 5-325 MG tablet Take 1 tablet by mouth every 6 (six) hours as needed.    ibuprofen (ADVIL) 200 MG tablet Take 400 mg by mouth every 6 (six) hours as needed.    Lancets (ONETOUCH ULTRASOFT) lancets To check BS once daily    lisinopril-hydrochlorothiazide (ZESTORETIC) 20-12.5 MG tablet Take 2 tablets by mouth once daily    metFORMIN (GLUCOPHAGE XR) 750 MG 24 hr tablet Take 1 tablet (750 mg total) by mouth 2 (two) times daily with a meal.    metroNIDAZOLE (FLAGYL) 500 MG tablet Take 1 tablet (500 mg total) by mouth 2 (two) times daily.     montelukast (SINGULAIR) 10 MG tablet Take 1 tablet by mouth once daily    pantoprazole (PROTONIX) 40 MG tablet Take 1 tablet (40 mg total) by mouth daily.    rosuvastatin (CRESTOR) 10 MG tablet Take 1 tablet (10 mg total) by mouth daily.    Semaglutide (RYBELSUS) 7 MG TABS Take 7 mg by mouth daily before breakfast. 07/24/2020: Prescribed by [name], NP    spironolactone (ALDACTONE) 25 MG tablet Take 1 tablet (25 mg total) by mouth daily.    SUMAtriptan (IMITREX) 50 MG tablet Take 1 tablet (50 mg total) by mouth every 2 (two) hours as needed for migraine. No more than 254m in 24 hours    verapamil (CALAN) 80 MG tablet Take 1 tablet (80 mg total) by mouth 2 (two) times daily.    zinc gluconate 50 MG tablet Take 50 mg by mouth daily.    No facility-administered encounter medications on file as of 08/15/2021.    Care Gaps: Shingrix Vaccine Ophthalmology Exam (Last Completed 03/06/2020)  Star Rating Drugs: Lisinopril-HCTZ 20-12.5 mg [facility] Glipizide 10 mg [facility] Rosuvastatin 10 mg last [facility] Rybelsus 7 mg [facility]Receive [facility] through [employer]  Metformin 750 mg last filled 05/20/2021 for [facility] day [facility].  Medication fill Gaps: None  [name], PTM reach out to [employer] to check on the patient application for Rybelsus, and after 3 attempts the line was disconnected three times.   Patient states she received a letter from [employer] stating her application is not fill out all the way which she states it was completed. Notified Clinical pharmacist to review the application and refax it again to [employer].I Inform the patient of the contact information to [employer], so she is able to contact them if she need to at  [phone].  [place] Pharmacist Assistant [phone]

## 2021-08-21 ENCOUNTER — Telehealth: Payer: Self-pay

## 2021-08-21 NOTE — Progress Notes (Signed)
? ? ?Chronic Care Management ?Pharmacy Assistant  ? ?Name: Alexis Lloyd  MRN: [id number] DOB: 04-01-1961 ? ?Reason for Encounter: Medication Review Patient assistance renewal application update. ?  ?Recent office visits:  ?No recent Office Visit ? ?Recent consult visits:  ?No recent Consult Visit ? ?Hospital visits:  ?None in previous 6 months ? ?Medications: ?Outpatient Encounter Medications as of 08/21/2021  ?Medication Sig Note  ? albuterol (VENTOLIN HFA) 108 (90 Base) MCG/ACT inhaler INHALE 2 PUFFS BY MOUTH EVERY 6 HOURS AS NEEDED   ? ascorbic Acid (VITAMIN C) 500 MG CPCR Take 500 mg by mouth daily.   ? aspirin EC 81 MG tablet Take 1 tablet (81 mg total) by mouth daily. Swallow whole.   ? Blood Glucose Monitoring Suppl (ONETOUCH VERIO) w/Device KIT To check blood sugar once daily   ? cetirizine (ZYRTEC) 10 MG tablet Take 1 tablet (10 mg total) by mouth daily.   ? Cholecalciferol (VITAMIN D3) 50 MCG (2000 UT) TABS Take 2,000 Units by mouth.   ? COVID-19 mRNA bivalent vaccine, Pfizer, (PFIZER COVID-19 VAC BIVALENT) injection Inject into the muscle.   ? Cyanocobalamin (VITAMIN B-12) 3000 MCG SUBL Place 1 tablet under the tongue daily at 6 (six) AM. Gummies   ? glipiZIDE (GLUCOTROL) 10 MG tablet TAKE 1 TABLET BY MOUTH  TWICE DAILY BEFORE A MEAL   ? glucose blood (ONETOUCH VERIO) test strip To check blood sugar once daily   ? HYDROcodone-acetaminophen (NORCO/VICODIN) 5-325 MG tablet Take 1 tablet by mouth every 6 (six) hours as needed.   ? ibuprofen (ADVIL) 200 MG tablet Take 400 mg by mouth every 6 (six) hours as needed.   ? Lancets (ONETOUCH ULTRASOFT) lancets To check BS once daily   ? lisinopril-hydrochlorothiazide (ZESTORETIC) 20-12.5 MG tablet Take 2 tablets by mouth once daily   ? metFORMIN (GLUCOPHAGE XR) 750 MG 24 hr tablet Take 1 tablet (750 mg total) by mouth 2 (two) times daily with a meal.   ? metroNIDAZOLE (FLAGYL) 500 MG tablet Take 1 tablet (500 mg total) by mouth 2 (two) times daily.   ? montelukast  (SINGULAIR) 10 MG tablet Take 1 tablet by mouth once daily   ? pantoprazole (PROTONIX) 40 MG tablet Take 1 tablet (40 mg total) by mouth daily.   ? rosuvastatin (CRESTOR) 10 MG tablet Take 1 tablet (10 mg total) by mouth daily.   ? Semaglutide (RYBELSUS) 7 MG TABS Take 7 mg by mouth daily before breakfast. 07/24/2020: Prescribed by [name], NP   ? spironolactone (ALDACTONE) 25 MG tablet Take 1 tablet (25 mg total) by mouth daily.   ? SUMAtriptan (IMITREX) 50 MG tablet Take 1 tablet (50 mg total) by mouth every 2 (two) hours as needed for migraine. No more than 265m in 24 hours   ? verapamil (CALAN) 80 MG tablet Take 1 tablet (80 mg total) by mouth 2 (two) times daily.   ? zinc gluconate 50 MG tablet Take 50 mg by mouth daily.   ? ?No facility-administered encounter medications on file as of 08/21/2021.  ? ? ?Care Gaps: ?Ophthalmology Exam (Last Completed 03/06/2020) ?  ?Star Rating Drugs: ?Lisinopril-HCTZ 20-12.5 mg [facility] ?Glipizide 10 mg [facility] ?Rosuvastatin 10 mg [facility] ?R[facility]belsus 7 mg [facility] through [employer] ?Metformin 750  mg last filled [facility]/20/2021 [facility] ?  ?Medication fill Gaps: ?None ?  ? ? ?Patient assistance application renewal update for Rybelsus: ? ?On hold with [employer] for 1 hour and 30 minutes and line was disconnected will try again on 08/22/2021. ? ?08/22/2021: ? ?After being on hold for 1 hour and 31 minutes I was able to speak with a representative regarding what information was missing from patient application. Per [employer], Patient application can only have one Health Care provider, and they have her Endocrinologist on file. Dr.  Honor Junes MD would need to fix the missing information on the  application. Patient application is missing patient information and the Rybelsus sig/ direction needs to be corrected and needs to have the right dose. Notified Clinical pharmacist.  ? ? ?Inform patient of above.Patient verbalized understanding, and states she has not taken Rybelsus in two days.Inform patient if she has or would like me to reach out to her Endocrinologist to ask if they have samples.Patient states she has already ask them, and "they have no idea what Im talking about". Inform patient I would reach out to her PCP to ask if they have any samples. Notified Clinical pharmacist.  ? ?Per Clinical pharmacist, For samples we can do 7 mg tablets if they have them or two 3 mg tablets.  ? ?Per Clinical pharmacist, I have samples of Rybelsus 3 mg ready for Alexis Lloyd to pick up, I got her 2 boxes so she can take 2 tablets daily.  ? ?Patient verbalized understanding. ? ?Alexis Lloyd,CPA ?Clinical Pharmacist Assistant ?[phone]  ? ?

## 2021-08-22 ENCOUNTER — Telehealth: Payer: Self-pay

## 2021-08-22 NOTE — Telephone Encounter (Signed)
Copied from [place] [id number]. Topic: General - Other ?>> Aug 22, 2021 11:27 AM Loma [place] wrote: ?Reason for CRM: Lab, next door was calling (Bessie)on behalf of pt per request of Alex in Pharmacy to ask if practice has any samples at all of Rybelsus as pt has been out for a few days. Please call Bessie back with reply at [phone]. Have reached out to office 2 but unable to transfer her over. ?

## 2021-08-26 NOTE — Telephone Encounter (Signed)
Patient is followed by Endocrine and Rybelsus was prescribed by them. Patient has follow up appointment coming up with them 03/30 . Per Daneil Dan if we have samples Ok to give. ? ?

## 2021-08-26 NOTE — Telephone Encounter (Signed)
We do not have any samples of Rybelsus 7 mg. Patient advised and reports that she has a box of '3mg'$  at home and she is going to take them and call us back to see if by then we have receive some. ?

## 2021-08-27 ENCOUNTER — Telehealth: Payer: Self-pay

## 2021-08-27 NOTE — Progress Notes (Signed)
Per Clinical pharmacist,  I have gotten a few error messages about the fax for Rybelsus patient assistance application not going through to Dr. Honor Junes, and I want to confirm they received it. If they haven't can you confirm the correct fax number and let me know so I can resend tomorrow. ? ?Per Endocrinology office, the last fax they received for Alexis Lloyd for [employer] was back in February, and they already took care of that. Inform them I reach out to [employer] on 08/22/2021 , and there was some information on the application that needed to be corrected.Endocrinology inform me of two fax numbers the clinical pharmacist could use - [phone] and [phone].Notified Clinical pharmacist.  ? ?08/30/2021: ?Per Endocrinology office, they did received the application, and they are waiting for Dr.O'Connell to complete his part. ? ?09/06/2021: ?Per Clinical pharmacist, patient  A1c worsened to 11.7%, can you check in again with endo to see if they signed her Rybelsus PAP? I would also like to see if we can move her appt up with me to 1-2 weeks. ? ?Per note on 09/05/2021, patient does not want to restart the Rybelsus as it makes her feel bad. I reach out to the patient to schedule a follow up with the clinical pharmacist.  ? ?Patient inform me she start [place] and would like to start the application process to receive assistance from [employer]. Patient verbalized consent to complete the application online with me over the phone. Patient needs her MBI number from her original medicare health care. Inform patient I was unable to locate this information in her chart, and she will need to reach out to Medicare at [phone] to request this information since she does not have the card on hand. Patient return my call and inform me her MBI number is [id number]. Patient states she will receive her card in the mail in 6 weeks.Per application online patient was denied due to income criteria not  met. ? ?I reach out to [employer] to see if can we appeal this denial.Per [employer], patient can provide proof of income and fax it to them, and if that is still greater then what is allowed she can provide documentation showing she spends 10% of her income on healthcare.Patient Verbalized understanding and states she has to reach out to social security and have them send her proof of income.Patient states it can take about a week, but once she receives it she will bring it to her PCP office for the Clinical pharmacist to fax over to [employer]. ? ?Anderson Malta ?Clinical Pharmacist Assistant ?[phone]  ? ?

## 2021-08-30 ENCOUNTER — Encounter: Payer: Self-pay | Admitting: Family Medicine

## 2021-08-30 ENCOUNTER — Ambulatory Visit (INDEPENDENT_AMBULATORY_CARE_PROVIDER_SITE_OTHER): Payer: Medicare Other | Admitting: Family Medicine

## 2021-08-30 ENCOUNTER — Other Ambulatory Visit: Payer: Self-pay

## 2021-08-30 VITALS — BP 136/57 | HR 85 | Temp 97.3°F | Resp 16 | Wt 227.8 lb

## 2021-08-30 DIAGNOSIS — N3941 Urge incontinence: Secondary | ICD-10-CM | POA: Insufficient documentation

## 2021-08-30 DIAGNOSIS — E1165 Type 2 diabetes mellitus with hyperglycemia: Secondary | ICD-10-CM

## 2021-08-30 DIAGNOSIS — R350 Frequency of micturition: Secondary | ICD-10-CM | POA: Diagnosis not present

## 2021-08-30 DIAGNOSIS — B3731 Acute candidiasis of vulva and vagina: Secondary | ICD-10-CM | POA: Diagnosis not present

## 2021-08-30 LAB — POCT URINALYSIS DIPSTICK
Bilirubin, UA: NEGATIVE
Blood, UA: NEGATIVE
Glucose, UA: POSITIVE — AB
Ketones, UA: NEGATIVE
Leukocytes, UA: NEGATIVE
Nitrite, UA: NEGATIVE
Protein, UA: POSITIVE — AB
Spec Grav, UA: 1.025 (ref 1.010–1.025)
Urobilinogen, UA: 0.2 E.U./dL
pH, UA: 6 (ref 5.0–8.0)

## 2021-08-30 MED ORDER — FLUCONAZOLE 150 MG PO TABS: ORAL_TABLET | ORAL | 0 refills | Status: DC

## 2021-08-30 NOTE — Assessment & Plan Note (Signed)
Acute, recurrent ?Will treat with oral medication ?Explained link between elevated blood sugars and yeast growth ?Have referred to urology to assist  ?

## 2021-08-30 NOTE — Assessment & Plan Note (Signed)
T2DM ?- Checking BG at home: intermittently ?- Medications: none currently; did not start Metformin XR 750 BID; is out of Rybelsus 7 mg QD ?- Compliance: Fair ?- Diet: regular ?- Exercise: walking ?- eye exam: due; will refer to assist with scheduling ?- foot exam: UTD: was completed at specialist ?- microalbumin: completed today ?- denies symptoms of hypoglycemia, polydipsia, numbness extremities, foot ulcers/trauma ?- endorses polyuria: have explained to patient likely BG/A1c is elevated and uncontrolled given lack of medications which is causing urinary symptoms including frequency and urgency; review of UA today shows spilling glucose and protein ? ?

## 2021-08-30 NOTE — Assessment & Plan Note (Signed)
New condition, likely related to urinary frequency d/t excess sugar seen in urine ?Recommend better control of BG levels with proper DM medications ?Pt is followed by endocrine but does not follow medications provided by PCP or endocrinologist ? ?

## 2021-08-30 NOTE — Assessment & Plan Note (Signed)
No UTI on review of UA. This is reassuring ?UA did show elevated glucose; which I've explained to patient indicates poor control of BG levels ?Will re-engage CCM to assist with getting back on Rybelsus  ?

## 2021-08-31 LAB — HEMOGLOBIN A1C
Est. average glucose Bld gHb Est-mCnc: 289 mg/dL
Hgb A1c MFr Bld: 11.7 % — ABNORMAL HIGH (ref 4.8–5.6)

## 2021-09-01 ENCOUNTER — Other Ambulatory Visit: Payer: Self-pay | Admitting: Family Medicine

## 2021-09-01 DIAGNOSIS — E1129 Type 2 diabetes mellitus with other diabetic kidney complication: Secondary | ICD-10-CM

## 2021-09-01 MED ORDER — DAPAGLIFLOZIN PROPANEDIOL 10 MG PO TABS: 10.0000 mg | ORAL_TABLET | Freq: Every day | ORAL | 1 refills | Status: DC

## 2021-09-04 LAB — MICROALBUMIN / CREATININE URINE RATIO
Creatinine, Urine: 159.3 mg/dL
Microalb/Creat Ratio: 62 mg/g creat — ABNORMAL HIGH (ref 0–29)
Microalbumin, Urine: 99.4 ug/mL

## 2021-09-04 LAB — SPECIMEN STATUS REPORT

## 2021-09-05 DIAGNOSIS — E1159 Type 2 diabetes mellitus with other circulatory complications: Secondary | ICD-10-CM | POA: Diagnosis not present

## 2021-09-05 DIAGNOSIS — E1169 Type 2 diabetes mellitus with other specified complication: Secondary | ICD-10-CM | POA: Diagnosis not present

## 2021-09-05 DIAGNOSIS — E785 Hyperlipidemia, unspecified: Secondary | ICD-10-CM | POA: Diagnosis not present

## 2021-09-05 DIAGNOSIS — I152 Hypertension secondary to endocrine disorders: Secondary | ICD-10-CM | POA: Diagnosis not present

## 2021-09-05 DIAGNOSIS — E1165 Type 2 diabetes mellitus with hyperglycemia: Secondary | ICD-10-CM | POA: Diagnosis not present

## 2021-09-16 ENCOUNTER — Other Ambulatory Visit: Payer: Self-pay | Admitting: Family Medicine

## 2021-09-16 DIAGNOSIS — K219 Gastro-esophageal reflux disease without esophagitis: Secondary | ICD-10-CM

## 2021-09-17 NOTE — Telephone Encounter (Signed)
Requested Prescriptions  ?Pending Prescriptions Disp Refills  ?? pantoprazole (PROTONIX) 40 MG tablet [Pharmacy Med Name: Pantoprazole Sodium 40 MG Oral Tablet Delayed Release] 90 tablet 0  ?  Sig: Take 1 tablet by mouth once daily  ?  ? Gastroenterology: Proton Pump Inhibitors Passed - 09/16/2021  9:30 AM  ?  ?  Passed - Valid encounter within last 12 months  ?  Recent Outpatient Visits   ?      ? 2 weeks ago Urinary frequency  ? [facility] Gwyneth Sprout, FNP  ? 7 months ago No-show for appointment  ? [facility] [address], Dionne Bucy, MD  ? 11 months ago Viral URI  ? [facility] [place], Dionne Bucy, MD  ? 12 months ago Pre-op examination  ? [facility], Laurita Quint, FNP  ? 1 year ago History of pneumonia  ? [place], [place]  ?  ?  ?Future Appointments   ?        ? In 3 weeks MacDiarmid, Nicki Reaper, MD [place]  ? In 2 months Gwyneth Sprout, [place], [place]  ? In 3 months Ralene Bathe, MD [place]  ?  ? ?  ?  ?  ? ?

## 2021-10-01 ENCOUNTER — Other Ambulatory Visit: Payer: Self-pay | Admitting: Family Medicine

## 2021-10-01 DIAGNOSIS — I10 Essential (primary) hypertension: Secondary | ICD-10-CM

## 2021-10-06 ENCOUNTER — Other Ambulatory Visit: Payer: Self-pay | Admitting: Family Medicine

## 2021-10-06 DIAGNOSIS — E1165 Type 2 diabetes mellitus with hyperglycemia: Secondary | ICD-10-CM

## 2021-10-07 ENCOUNTER — Telehealth: Payer: Self-pay | Admitting: Family Medicine

## 2021-10-07 ENCOUNTER — Ambulatory Visit: Payer: Self-pay

## 2021-10-07 NOTE — Telephone Encounter (Signed)
Medication Refill - Medication:  ? ?fluconazole (DIFLUCAN) 150 MG tablet  ? ?Has the patient contacted their pharmacy? Yes.  Pt stated she was only given 1 pill and needs the other 2 pills.  ?(Agent: If no, request that the patient contact the pharmacy for the refill. If patient does not wish to contact the pharmacy document the reason why and proceed with request.) ?(Agent: If yes, when and what did the pharmacy advise?) ? ?Preferred Pharmacy (with phone number or street name):  ? ?[facility] [facility] [place] [facility] [place]  ?[place], [place] [address]  ?Phone:  [phone]  Fax:  [phone]  ? ?Has the patient been seen for an appointment in the last year OR does the patient have an upcoming appointment? Yes.   ? ?Agent: Please be advised that RX refills may take up to 3 business days. We ask that you follow-up with your pharmacy. ? ?

## 2021-10-07 NOTE — Telephone Encounter (Signed)
Chief Complaint: Vaginal itching ?Symptoms: Urinating a lot, vaginal odor, sore on vagina ?Frequency: Since OV 08/30/21, symptoms did not improve ?Pertinent Negatives: Patient denies discharge ?Disposition: '[]'$ ED /'[]'$ Urgent Care (no appt availability in office) / '[x]'$ Appointment(In office/virtual)/ '[]'$  [place] [facility]/ '[]'$ Home Care/ '[]'$ Refused Recommended Disposition /'[]'$ [place] [facility]/ '[]'$  Follow-up with PCP ?Additional Notes: patient requested a refill of diflucan in another encounter, advised the need for re-evaluation since over a month of symptoms, advised I will send this to Daneil Dan to consider the refill and someone will call back with her decision. Appointment scheduled for Wednesday, 10/10/21. ? ? ? ?Reason for Disposition ? [1] Vaginal itching AND [2] not improved > 3 days following CARE ADVICE ? ?Answer Assessment - Initial Assessment Questions ?1. SYMPTOM: "What's the main symptom you're concerned about?" (e.g., pain, itching, dryness) ?    Vaginal itching ?2. LOCATION: "Where is the itching located?" (e.g., inside/outside, left/right) ?    Inside ?3. ONSET: "When did the symptoms start?" ?    Never stopped since OV in March ?4. PAIN: "Is there any pain?" If Yes, ask: "How bad is it?" (Scale: 1-10; mild, moderate, severe) ?    No ?5. ITCHING: "Is there any itching?" If Yes, ask: "How bad is it?" (Scale: 1-10; mild, moderate, severe) ?    Severe ?6. CAUSE: "What do you think is causing the discharge?" "Have you had the same problem before? What happened then?" ?    Yeast infections ?7. OTHER SYMPTOMS: "Do you have any other symptoms?" (e.g., fever, itching, vaginal bleeding, pain with urination, injury to genital area, vaginal foreign body) ?    Urinating a lot over a lot over a month since last OV ?8. PREGNANCY: "Is there any chance you are pregnant?" "When was your last menstrual period?" ?    N/A ? ?Protocols used: Vaginal Symptoms-A-AH ? ?

## 2021-10-07 NOTE — Telephone Encounter (Signed)
Patient called and triaged in another encounter for the request of diflucan, see NT encounter. ?

## 2021-10-08 NOTE — Telephone Encounter (Signed)
Requested medications are due for refill today.  A bit soon ? ?Requested medications are on the active medications list.  yes ? ?Last refill. 11/13/2020 #180 3 refills ? ?Future visit scheduled.   yes ? ?Notes to clinic.  Pt requesting a 1 year supply. ? ? ? ?Requested Prescriptions  ?Pending Prescriptions Disp Refills  ? glipiZIDE (GLUCOTROL) 10 MG tablet [Pharmacy Med Name: glipiZIDE 10 MG Oral Tablet] 180 tablet 0  ?  Sig: TAKE 1 TABLET BY MOUTH  TWICE DAILY BEFORE A MEAL  ?  ? Endocrinology:  Diabetes - Sulfonylureas Failed - 10/06/2021 10:14 PM  ?  ?  Failed - HBA1C is between 0 and 7.9 and within 180 days  ?  Hgb A1c MFr Bld  ?Date Value Ref Range Status  ?08/30/2021 11.7 (H) 4.8 - 5.6 % Final  ?  Comment:  ?           Prediabetes: 5.7 - 6.4 ?         Diabetes: >6.4 ?         Glycemic control for adults with diabetes: <7.0 ?  ?  ?  ?  ?  Passed - Cr in normal range and within 360 days  ?  Creatinine  ?Date Value Ref Range Status  ?04/14/2014 0.75 0.60 - 1.30 mg/dL Final  ? ?Creatinine, Ser  ?Date Value Ref Range Status  ?05/17/2021 0.73 0.57 - 1.00 mg/dL Final  ?  ?  ?  ?  Passed - Valid encounter within last 6 months  ?  Recent Outpatient Visits   ? ?      ? 1 month ago Urinary frequency  ? [facility] Gwyneth Sprout, FNP  ? 8 months ago No-show for appointment  ? [facility] [place], Dionne Bucy, MD  ? 12 months ago Viral URI  ? [facility] [place], Dionne Bucy, MD  ? 1 year ago Pre-op examination  ? [facility], Laurita Quint, FNP  ? 1 year ago History of pneumonia  ? [place], [place]  ? ?  ?  ?Future Appointments   ? ?        ? In 2 days Gwyneth Sprout, [place], [place]  ? In 6 days MacDiarmid, Nicki Reaper, MD [place]  ? In 1 month Gwyneth Sprout, [place], [facility]  ? In 3 months Ralene Bathe, MD [place]  ? ?  ? ? ?  ?  ?  ?  ?

## 2021-10-08 NOTE — Telephone Encounter (Signed)
Pt returned our call - Gave message to pt. She will keep her appointment for Thursday. ?

## 2021-10-08 NOTE — Telephone Encounter (Signed)
Lmtcb okay for [facility] nurse triage to advise. KW ?

## 2021-10-10 ENCOUNTER — Ambulatory Visit (INDEPENDENT_AMBULATORY_CARE_PROVIDER_SITE_OTHER): Payer: Medicare Other | Admitting: Family Medicine

## 2021-10-10 ENCOUNTER — Encounter: Payer: Self-pay | Admitting: Family Medicine

## 2021-10-10 VITALS — BP 115/83 | HR 81 | Temp 98.4°F | Resp 16 | Ht <= 58 in | Wt 219.3 lb

## 2021-10-10 DIAGNOSIS — N898 Other specified noninflammatory disorders of vagina: Secondary | ICD-10-CM | POA: Diagnosis not present

## 2021-10-10 DIAGNOSIS — N951 Menopausal and female climacteric states: Secondary | ICD-10-CM | POA: Diagnosis not present

## 2021-10-10 DIAGNOSIS — N3001 Acute cystitis with hematuria: Secondary | ICD-10-CM | POA: Diagnosis not present

## 2021-10-10 LAB — POCT URINALYSIS DIPSTICK
Bilirubin, UA: NEGATIVE
Blood, UA: POSITIVE
Glucose, UA: POSITIVE — AB
Ketones, UA: NEGATIVE
Nitrite, UA: NEGATIVE
Protein, UA: NEGATIVE
Spec Grav, UA: >=1.03 — AB (ref 1.010–1.025)
Urobilinogen, UA: 0.2 E.U./dL
pH, UA: 5 (ref 5.0–8.0)

## 2021-10-10 MED ORDER — PREMARIN 0.625 MG/GM VA CREA: 1.0000 | TOPICAL_CREAM | Freq: Every day | VAGINAL | 12 refills | Status: DC

## 2021-10-10 MED ORDER — ESTRADIOL 0.1 MG/GM VA CREA: 1.0000 | TOPICAL_CREAM | Freq: Every day | VAGINAL | 12 refills | Status: DC

## 2021-10-10 MED ORDER — CEFDINIR 300 MG PO CAPS: 300.0000 mg | ORAL_CAPSULE | Freq: Two times a day (BID) | ORAL | 0 refills | Status: DC

## 2021-10-10 NOTE — Assessment & Plan Note (Signed)
Acute on chronic, exacerbated with complaints of itching and discharge ?2 Rx provided for patient to choose one based on cost ?RTC if symptoms have not improved following start of medication ?

## 2021-10-10 NOTE — Assessment & Plan Note (Signed)
Chronic, x2 months, with associated itching ?NuSwab sent today ?External skin WDL; one healing area s/p scratching ?Internal exam with minimal discharge; PCP collected sample ?

## 2021-10-10 NOTE — Assessment & Plan Note (Signed)
POC UA positive; will send for Cx ?Discussed with patient ABX intolerance ?Will advise if culture returns recommending ABX change ?

## 2021-10-10 NOTE — Progress Notes (Signed)
? ? I,Tiffany J Bragg,acting as a scribe for Gwyneth Sprout, FNP.,have documented all relevant documentation on the behalf of Gwyneth Sprout, FNP,as directed by  Gwyneth Sprout, FNP while in the presence of Gwyneth Sprout, FNP.  ? ? ?Established patient visit ? ? ?Patient: Alexis Lloyd   DOB: 11-29-1960   61 y.o. Female  MRN: [id number] ?Visit Date: 10/10/2021 ? ?Today's healthcare provider: Gwyneth Sprout, FNP  ?Re Introduced to nurse practitioner role and practice setting.  All questions answered.  Discussed provider/patient relationship and expectations. ? ? ?Chief Complaint  ?Patient presents with  ? Vaginal Itching  ?  Patient complains of frequent urinating, excessive thirst, vaginal itching and discharge for two months  ? ?Subjective  ?  ?Vaginal Itching ? ?HPI   ? ? Vaginal Itching   ? Additional comments: Patient complains of frequent urinating, excessive thirst, vaginal itching and discharge for two months ? ?  ?  ?Last edited by Smitty Knudsen, CMA on 10/10/2021  2:13 PM.  ?  ?  ? ?Medications: ?Outpatient Medications Prior to Visit  ?Medication Sig  ? albuterol (VENTOLIN HFA) 108 (90 Base) MCG/ACT inhaler INHALE 2 PUFFS BY MOUTH EVERY 6 HOURS AS NEEDED  ? ascorbic Acid (VITAMIN C) 500 MG CPCR Take 500 mg by mouth daily.  ? aspirin EC 81 MG tablet Take 1 tablet (81 mg total) by mouth daily. Swallow whole.  ? Blood Glucose Monitoring Suppl (ONETOUCH VERIO) w/Device KIT To check blood sugar once daily  ? cetirizine (ZYRTEC) 10 MG tablet Take 1 tablet (10 mg total) by mouth daily.  ? Cholecalciferol (VITAMIN D3) 50 MCG (2000 UT) TABS Take 2,000 Units by mouth.  ? COVID-19 mRNA bivalent vaccine, Pfizer, (PFIZER COVID-19 VAC BIVALENT) injection Inject into the muscle.  ? Cyanocobalamin (VITAMIN B-12) 3000 MCG SUBL Place 1 tablet under the tongue daily at 6 (six) AM. Gummies  ? dapagliflozin propanediol (FARXIGA) 10 MG TABS tablet Take 1 tablet (10 mg total) by mouth daily before breakfast.  ? fluconazole  (DIFLUCAN) 150 MG tablet Take 1 tablet by mouth; repeat dose if you have symptoms after 4 days.  ? glipiZIDE (GLUCOTROL) 10 MG tablet TAKE 1 TABLET BY MOUTH  TWICE DAILY BEFORE A MEAL  ? glucose blood (ONETOUCH VERIO) test strip To check blood sugar once daily  ? HYDROcodone-acetaminophen (NORCO/VICODIN) 5-325 MG tablet Take 1 tablet by mouth every 6 (six) hours as needed.  ? ibuprofen (ADVIL) 200 MG tablet Take 400 mg by mouth every 6 (six) hours as needed.  ? Lancets (ONETOUCH ULTRASOFT) lancets To check BS once daily  ? lisinopril-hydrochlorothiazide (ZESTORETIC) 20-12.5 MG tablet Take 2 tablets by mouth once daily  ? metFORMIN (GLUCOPHAGE XR) 750 MG 24 hr tablet Take 1 tablet (750 mg total) by mouth 2 (two) times daily with a meal.  ? metroNIDAZOLE (FLAGYL) 500 MG tablet Take 1 tablet (500 mg total) by mouth 2 (two) times daily.  ? montelukast (SINGULAIR) 10 MG tablet Take 1 tablet by mouth once daily  ? pantoprazole (PROTONIX) 40 MG tablet Take 1 tablet by mouth once daily  ? rosuvastatin (CRESTOR) 10 MG tablet Take 1 tablet (10 mg total) by mouth daily.  ? Semaglutide (RYBELSUS) 7 MG TABS Take 7 mg by mouth daily before breakfast.  ? spironolactone (ALDACTONE) 25 MG tablet Take 1 tablet (25 mg total) by mouth daily.  ? SUMAtriptan (IMITREX) 50 MG tablet Take 1 tablet (50 mg total) by mouth every 2 (two)  hours as needed for migraine. No more than $RemoveB'200mg'bhSEhclr$  in 24 hours  ? verapamil (CALAN) 80 MG tablet Take 1 tablet (80 mg total) by mouth 2 (two) times daily.  ? zinc gluconate 50 MG tablet Take 50 mg by mouth daily.  ? ?No facility-administered medications prior to visit.  ? ? ?Review of Systems ? ? ?  Objective  ?  ?BP 115/83 (BP Location: Right Arm, Patient Position: Sitting, Cuff Size: Large)   Pulse 81   Temp 98.4 ?F (36.9 ?C) (Oral)   Resp 16   Ht $R'4\' 10"'CL$  (1.473 m)   Wt 219 lb 4.8 oz (99.5 kg)   SpO2 96%   BMI 45.83 kg/m?  ? ? ?Physical Exam ?Vitals and nursing note reviewed.  ?Constitutional:   ?    General: She is not in acute distress. ?   Appearance: Normal appearance. She is obese. She is not ill-appearing, toxic-appearing or diaphoretic.  ?HENT:  ?   Head: Normocephalic and atraumatic.  ?Cardiovascular:  ?   Rate and Rhythm: Normal rate and regular rhythm.  ?   Pulses: Normal pulses.  ?   Heart sounds: Normal heart sounds. No murmur heard. ?  No friction rub. No gallop.  ?Pulmonary:  ?   Effort: Pulmonary effort is normal. No respiratory distress.  ?   Breath sounds: Normal breath sounds. No stridor. No wheezing, rhonchi or rales.  ?Chest:  ?   Chest wall: No tenderness.  ?Abdominal:  ?   General: Bowel sounds are normal.  ?   Palpations: Abdomen is soft.  ?Musculoskeletal:     ?   General: No swelling, tenderness, deformity or signs of injury. Normal range of motion.  ?   Right lower leg: No edema.  ?   Left lower leg: No edema.  ?Skin: ?   General: Skin is warm and dry.  ?   Capillary Refill: Capillary refill takes less than 2 seconds.  ?   Coloration: Skin is not jaundiced or pale.  ?   Findings: No bruising, erythema, lesion or rash.  ?Neurological:  ?   General: No focal deficit present.  ?   Mental Status: She is alert and oriented to person, place, and time. Mental status is at baseline.  ?   Cranial Nerves: No cranial nerve deficit.  ?   Sensory: No sensory deficit.  ?   Motor: No weakness.  ?   Coordination: Coordination normal.  ?Psychiatric:     ?   Mood and Affect: Mood normal.     ?   Behavior: Behavior normal.     ?   Thought Content: Thought content normal.     ?   Judgment: Judgment normal.  ?  ? ?No results found for any visits on 10/10/21. ? Assessment & Plan  ?  ? ?Problem List Items Addressed This Visit   ? ?  ? Genitourinary  ? Acute cystitis with hematuria  ?  POC UA positive; will send for Cx ?Discussed with patient ABX intolerance ?Will advise if culture returns recommending ABX change ? ?  ?  ? Relevant Medications  ? cefdinir (OMNICEF) 300 MG capsule  ? Other Relevant Orders  ?  Urine Culture  ? Vaginal dryness, menopausal  ?  Acute on chronic, exacerbated with complaints of itching and discharge ?2 Rx provided for patient to choose one based on cost ?RTC if symptoms have not improved following start of medication ? ?  ?  ? Relevant Medications  ?  estradiol (ESTRACE VAGINAL) 0.1 MG/GM vaginal cream  ? conjugated estrogens (PREMARIN) vaginal cream  ?  ? Other  ? Vaginal discharge - Primary  ?  Chronic, x2 months, with associated itching ?NuSwab sent today ?External skin WDL; one healing area s/p scratching ?Internal exam with minimal discharge; PCP collected sample ? ?  ?  ? Relevant Orders  ? NuSwab Vaginitis Plus (VG+)  ? ? ? ?Return if symptoms worsen or fail to improve.  ?   ? ?I, Gwyneth Sprout, FNP, have reviewed all documentation for this visit. The documentation on 10/10/21 for the exam, diagnosis, procedures, and orders are all accurate and complete. ? ? ? ?Gwyneth Sprout, FNP  ?[facility] ?[phone] (phone) ?[phone] (fax) ? ? [facility]  ?

## 2021-10-10 NOTE — Addendum Note (Signed)
Addended by: Smitty Knudsen on: 10/10/2021 03:27 PM ? ? Modules accepted: Orders ? ?

## 2021-10-13 LAB — NUSWAB VAGINITIS PLUS (VG+)
Atopobium vaginae: HIGH Score — AB
BVAB 2: HIGH Score — AB
Candida albicans, NAA: POSITIVE — AB
Candida glabrata, NAA: POSITIVE — AB
Chlamydia trachomatis, NAA: NEGATIVE
Megasphaera 1: HIGH Score — AB
Neisseria gonorrhoeae, NAA: NEGATIVE
Trich vag by NAA: NEGATIVE

## 2021-10-14 ENCOUNTER — Ambulatory Visit: Payer: Medicare Other | Admitting: Urology

## 2021-10-14 ENCOUNTER — Other Ambulatory Visit: Payer: Self-pay | Admitting: Family Medicine

## 2021-10-14 VITALS — BP 98/63 | HR 63

## 2021-10-14 DIAGNOSIS — N3020 Other chronic cystitis without hematuria: Secondary | ICD-10-CM

## 2021-10-14 DIAGNOSIS — N3941 Urge incontinence: Secondary | ICD-10-CM | POA: Diagnosis not present

## 2021-10-14 DIAGNOSIS — B9689 Other specified bacterial agents as the cause of diseases classified elsewhere: Secondary | ICD-10-CM

## 2021-10-14 DIAGNOSIS — B3731 Acute candidiasis of vulva and vagina: Secondary | ICD-10-CM

## 2021-10-14 LAB — MICROSCOPIC EXAMINATION: RBC, Urine: NONE SEEN /hpf (ref 0–2)

## 2021-10-14 LAB — URINALYSIS, COMPLETE
Bilirubin, UA: NEGATIVE
Ketones, UA: NEGATIVE
Leukocytes,UA: NEGATIVE
Nitrite, UA: NEGATIVE
Protein,UA: NEGATIVE
RBC, UA: NEGATIVE
Specific Gravity, UA: 1.025 (ref 1.005–1.030)
Urobilinogen, Ur: 0.2 mg/dL (ref 0.2–1.0)
pH, UA: 5.5 (ref 5.0–7.5)

## 2021-10-14 LAB — URINE CULTURE

## 2021-10-14 MED ORDER — METRONIDAZOLE 500 MG PO TABS: 500.0000 mg | ORAL_TABLET | Freq: Two times a day (BID) | ORAL | 0 refills | Status: AC

## 2021-10-14 MED ORDER — FLUCONAZOLE 150 MG PO TABS: ORAL_TABLET | ORAL | 0 refills | Status: DC

## 2021-10-14 NOTE — Progress Notes (Signed)
10/14/2021 10:56 AM   Alexis Lloyd [birth year] [id number]  Referring provider: Gwyneth Sprout, [address] [place],  [place] [id number]  Chief Complaint  Patient presents with   Urinary Incontinence    HPI: I was consulted to assess this patient for a bladder infection.  It does sound that she was recently having severe nocturia with urgency and urge incontinence but also a lot of vaginal itchiness and is using a cream.  Apparently she is also on Flagyl and cefdinir.  Her urgency and frequency are starting to improve.  The urine may have been a little bit foul-smelling with no pain  She gets yeast infections apparently from her diabetes.  Approximately 6 months ago she was continent voiding every 2 hours.  She is always gotten up 3-5 times at night.  She takes a diuretic but no ankle edema  She has a positive culture in January 2022 in May 2023.  No recent renal x-ray   PMH: Past Medical History:  Diagnosis Date   Allergy    Arthritis    Breast cancer ([facility])    Breast cancer, left breast ([facility]) 10/04/2014   Diabetes mellitus without complication (HCC)    GERD (gastroesophageal reflux disease)    Hyperlipidemia    Hypertension    Osteoporosis    Sleep apnea     Surgical History: Past Surgical History:  Procedure Laterality Date   ABDOMINAL HYSTERECTOMY     due to fibroid tumors 1990 Dr. Quenten Raven   BREAST BIOPSY Left    negative   BREAST BIOPSY Left    04/2013 positive   BREAST CYST ASPIRATION Left    BREAST EXCISIONAL BIOPSY Left    2015   BREAST LUMPECTOMY Left 06/17/2013   BREAST REDUCTION SURGERY Bilateral    CESAREAN SECTION     x's 2   CHOLECYSTECTOMY  09/2001   COLONOSCOPY WITH PROPOFOL N/A 09/11/2017   Procedure: COLONOSCOPY WITH PROPOFOL;  Surgeon: Virgel Manifold, MD;  Location: [facility] ENDOSCOPY;  Service: Endoscopy;  Laterality: N/A;   ESOPHAGOGASTRODUODENOSCOPY (EGD) WITH PROPOFOL N/A 09/11/2017   Procedure: ESOPHAGOGASTRODUODENOSCOPY (EGD)  WITH PROPOFOL;  Surgeon: Virgel Manifold, MD;  Location: [facility] ENDOSCOPY;  Service: Endoscopy;  Laterality: N/A;   REDUCTION MAMMAPLASTY     s/p radiation therapy Left    2015    Home Medications:  Allergies as of 10/14/2021       Reactions   Allegra [fexofenadine] Nausea And Vomiting   Avelox [moxifloxacin] Other (See Comments)   Hallucinations   Bactrim [sulfamethoxazole-trimethoprim] Other (See Comments)   unknown   Etodolac Nausea Only   Fish-derived Products Nausea And Vomiting   With "seafood"   Penicillins Diarrhea, Nausea And Vomiting   Shellfish Allergy Nausea Only   Sulfa Antibiotics Other (See Comments)   unknown   Macrobid [nitrofurantoin Monohyd Macro] Other (See Comments)   Constipation and globus hystericus.        Medication List        Accurate as of Oct 14, 2021 10:56 AM. If you have any questions, ask your nurse or doctor.          albuterol 108 (90 Base) MCG/ACT inhaler Commonly known as: VENTOLIN HFA INHALE 2 PUFFS BY MOUTH EVERY 6 HOURS AS NEEDED   ascorbic Acid 500 MG Cpcr Commonly known as: VITAMIN C Take 500 mg by mouth daily.   aspirin EC 81 MG tablet Take 1 tablet (81 mg total) by mouth daily. Swallow whole.   cefdinir 300  MG capsule Commonly known as: OMNICEF Take 1 capsule (300 mg total) by mouth 2 (two) times daily.   cetirizine 10 MG tablet Commonly known as: ZYRTEC Take 1 tablet (10 mg total) by mouth daily.   dapagliflozin propanediol 10 MG Tabs tablet Commonly known as: Farxiga Take 1 tablet (10 mg total) by mouth daily before breakfast.   estradiol 0.1 MG/GM vaginal cream Commonly known as: ESTRACE VAGINAL Place 1 Applicatorful vaginally at bedtime.   fluconazole 150 MG tablet Commonly known as: DIFLUCAN Take for vaginal yeast, repeat dose in 4 days is symptoms remain. What changed: additional instructions Changed by: Gwyneth Sprout, FNP   glipiZIDE 10 MG tablet Commonly known as: GLUCOTROL TAKE 1 TABLET BY  MOUTH  TWICE DAILY BEFORE A MEAL   HYDROcodone-acetaminophen 5-325 MG tablet Commonly known as: NORCO/VICODIN Take 1 tablet by mouth every 6 (six) hours as needed.   ibuprofen 200 MG tablet Commonly known as: ADVIL Take 400 mg by mouth every 6 (six) hours as needed.   lisinopril-hydrochlorothiazide 20-12.5 MG tablet Commonly known as: ZESTORETIC Take 2 tablets by mouth once daily   metFORMIN 750 MG 24 hr tablet Commonly known as: Glucophage XR Take 1 tablet (750 mg total) by mouth 2 (two) times daily with a meal.   metroNIDAZOLE 500 MG tablet Commonly known as: FLAGYL Take 1 tablet (500 mg total) by mouth 2 (two) times daily for 7 days.   montelukast 10 MG tablet Commonly known as: SINGULAIR Take 1 tablet by mouth once daily   onetouch ultrasoft lancets To check BS once daily   OneTouch Verio test strip Generic drug: glucose blood To check blood sugar once daily   OneTouch Verio w/Device Kit To check blood sugar once daily   pantoprazole 40 MG tablet Commonly known as: PROTONIX Take 1 tablet by mouth once daily   Pfizer COVID-19 Vac Bivalent injection Generic drug: COVID-19 mRNA bivalent vaccine (Pfizer) Inject into the muscle.   Premarin vaginal cream Generic drug: conjugated estrogens Place 1 Applicatorful vaginally daily.   rosuvastatin 10 MG tablet Commonly known as: Crestor Take 1 tablet (10 mg total) by mouth daily.   Rybelsus 7 MG Tabs Generic drug: Semaglutide Take 7 mg by mouth daily before breakfast.   spironolactone 25 MG tablet Commonly known as: ALDACTONE Take 1 tablet (25 mg total) by mouth daily.   SUMAtriptan 50 MG tablet Commonly known as: Imitrex Take 1 tablet (50 mg total) by mouth every 2 (two) hours as needed for migraine. No more than 261m in 24 hours   verapamil 80 MG tablet Commonly known as: CALAN Take 1 tablet (80 mg total) by mouth 2 (two) times daily.   Vitamin B-12 3000 MCG Subl Place 1 tablet under the tongue daily at  6 (six) AM. Gummies   Vitamin D3 50 MCG (2000 UT) Tabs Take 2,000 Units by mouth.   zinc gluconate 50 MG tablet Take 50 mg by mouth daily.        Allergies:  Allergies  Allergen Reactions   Allegra [Fexofenadine] Nausea And Vomiting   Avelox [Moxifloxacin] Other (See Comments)    Hallucinations   Bactrim [Sulfamethoxazole-Trimethoprim] Other (See Comments)    unknown   Etodolac Nausea Only   Fish-Derived Products Nausea And Vomiting    With "seafood"   Penicillins Diarrhea and Nausea And Vomiting   Shellfish Allergy Nausea Only   Sulfa Antibiotics Other (See Comments)    unknown   Macrobid [Nitrofurantoin Monohyd Macro] Other (See Comments)  Constipation and globus hystericus.    Family History: Family History  Problem Relation Age of Onset   Hypertension Mother    Cancer Mother        breast and rectal cancer   Breast cancer Mother 42   Asthma Brother    Hypertension Brother    Allergic rhinitis Brother    Breast cancer Maternal Grandmother     Social History:  reports that she quit smoking about 24 years ago. Her smoking use included cigarettes. She has a 5.25 pack-year smoking history. She has never used smokeless tobacco. She reports that she does not drink alcohol and does not use drugs.  ROS:                                        Physical Exam: BP 98/63   Pulse 63   Constitutional:  Alert and oriented, No acute distress.   Laboratory Data: Lab Results  Component Value Date   WBC 7.2 05/17/2021   HGB 11.6 05/17/2021   HCT 34.6 05/17/2021   MCV 85 05/17/2021   PLT 315 05/17/2021    Lab Results  Component Value Date   CREATININE 0.73 05/17/2021    No results found for: PSA  No results found for: TESTOSTERONE  Lab Results  Component Value Date   HGBA1C 11.7 (H) 08/30/2021    Urinalysis    Component Value Date/Time   COLORURINE YELLOW (A) 06/11/2020 0054   APPEARANCEUR CLEAR (A) 06/11/2020 0054    APPEARANCEUR Cloudy (A) 07/16/2015 0000   LABSPEC 1.028 06/11/2020 0054   LABSPEC 1.008 03/30/2013 2057   PHURINE 5.0 06/11/2020 0054   GLUCOSEU NEGATIVE 06/11/2020 0054   GLUCOSEU >=500 03/30/2013 2057   HGBUR SMALL (A) 06/11/2020 0054   BILIRUBINUR negative 10/10/2021 1523   BILIRUBINUR Negative 07/16/2015 0000   BILIRUBINUR Negative 03/30/2013 2057   KETONESUR negative 09/19/2020 1336   KETONESUR NEGATIVE 06/11/2020 0054   PROTEINUR Negative 10/10/2021 1523   PROTEINUR NEGATIVE 06/11/2020 0054   UROBILINOGEN 0.2 10/10/2021 1523   NITRITE negative 10/10/2021 1523   NITRITE POSITIVE (A) 06/11/2020 0054   LEUKOCYTESUR Trace (A) 10/10/2021 1523   LEUKOCYTESUR NEGATIVE 06/11/2020 0054   LEUKOCYTESUR Negative 03/30/2013 2057    Pertinent Imaging:   Assessment & Plan: Patient has had a recent bladder infection.  Hopefully her urinary symptoms will settle down.  The role of medication for nighttime frequency discussed and pathophysiology discussed.  We will get another culture and call if abnormal.  I will get an ultrasound and call if abnormal.  She chose watchful waiting for residual nocturia and I thought this is reasonable.  We will see her as needed    1. Urge incontinence of urine  - Urinalysis, Complete   No follow-ups on file.  Reece Packer, MD  [facility] [address] [place], [place] [address] [phone]

## 2021-10-15 ENCOUNTER — Ambulatory Visit: Payer: Self-pay

## 2021-10-15 NOTE — Telephone Encounter (Signed)
Spoke to patient earlier that all prescriptions were needed. ?

## 2021-10-15 NOTE — Telephone Encounter (Signed)
Summary: medication clarity  ? Pt was prescribed 2 antibiotic and 3 creams / pt wants to know which ones to take since they seem to be for the same thing/ please advise   ?  ? ?Chief Complaint: Pt. Verifying "I need all these prescriptions." Could not afford Estrace cream - $80. ?Symptoms: n/a ?Frequency: n/a ?Pertinent Negatives: Patient denies n/a ?Disposition: '[]'$ ED /'[]'$ Urgent Care (no appt availability in office) / '[]'$ Appointment(In office/virtual)/ '[]'$  [facility]/ '[]'$ Home Care/ '[]'$ Refused Recommended Disposition /'[]'$ [facility]/ '[x]'$  Follow-up with PCP ?Additional Notes:   ?Answer Assessment - Initial Assessment Questions ?1. NAME of MEDICATION: "What medicine are you calling about?" ?    Estrace cream "was too expensive." ?2. QUESTION: "What is your question?" (e.g., double dose of medicine, side effect) ?    "Do I need that cream?" ?3. PRESCRIBING HCP: "Who prescribed it?" Reason: if prescribed by specialist, call should be referred to that group. ?    Rollene Rotunda ?4. SYMPTOMS: "Do you have any symptoms?" ?    N/a ?5. SEVERITY: If symptoms are present, ask "Are they mild, moderate or severe?" ?    N/a ?6. PREGNANCY:  "Is there any chance that you are pregnant?" "When was your last menstrual period?" ?    No ? ?Protocols used: Medication Question Call-A-AH ? ?

## 2021-10-18 ENCOUNTER — Telehealth: Payer: Self-pay

## 2021-10-18 LAB — CULTURE, URINE COMPREHENSIVE

## 2021-10-18 NOTE — Progress Notes (Signed)
Chronic Care Management APPOINTMENT REMINDER   Alexis Lloyd was reminded to have all medications, supplements and any blood glucose and blood pressure readings available for review with [name], Pharm. D, at her telephone visit on 10/21/2021 at 3:45 pm .  Patient states she has a 3:00 appointment for a dental cleaning, and would like to move appointment up to 1:00 pm.Telephone appointment reschedule with the clinical pharmacist on 10/21/2021 at 1:00 pm.  [facility] Pharmacist Assistant [phone]

## 2021-10-21 ENCOUNTER — Ambulatory Visit (INDEPENDENT_AMBULATORY_CARE_PROVIDER_SITE_OTHER): Payer: Medicare Other

## 2021-10-21 DIAGNOSIS — E1129 Type 2 diabetes mellitus with other diabetic kidney complication: Secondary | ICD-10-CM

## 2021-10-21 DIAGNOSIS — I10 Essential (primary) hypertension: Secondary | ICD-10-CM

## 2021-10-21 DIAGNOSIS — E1165 Type 2 diabetes mellitus with hyperglycemia: Secondary | ICD-10-CM

## 2021-10-21 NOTE — Progress Notes (Signed)
Chronic Care Management Pharmacy Note  10/22/2021 Name:  Alexis Lloyd MRN:  [id number] DOB:  [birth year]  Summary: Patient presents for CCM follow-up.  -Last three office readings have been low/borderline low.  -Discussed PLATE method, portion control at length this visit.  -Counseled patient on the importance of improving blood sugars as soon as possible to minimize risks of complications.   Recommendations/Changes made from today's visit: -STOP Verapamil. Instructed patient to begin monitoring blood pressure at home or [employer] 1-2 times weekly.  -Refill of Farxiga sent in for AZ&ME patient assistance. -Recommended retrial of Ozempic or initiation of once daily insulin, patient hesitant to add more medications today. -Recommended rechecking lipid panel at PCP follow-up.   Plan: CPP follow-up 1 month   Subjective: Alexis Lloyd is an 61 y.o. year old female who is a primary patient of Gwyneth Sprout, FNP.  The CCM team was consulted for assistance with disease management and care coordination needs.    Engaged with patient by telephone for follow up visit in response to provider referral for pharmacy case management and/or care coordination services.   Consent to Services:  The patient was given information about Chronic Care Management services, agreed to services, and gave verbal consent prior to initiation of services.  Please see initial visit note for detailed documentation.   Patient Care Team: Gwyneth Sprout, FNP as PCP - General (Family Medicine) Imagene Riches, CNM as Midwife (Obstetrics) Germaine Pomfret, [facility] (Pharmacist) Lonia Farber, MD as Consulting Physician (Endocrinology)  Recent office visits: 10/10/21: Patient presented to [name], FNP for cystitis.  08/30/21: Patient presented to [name], FNP for urinary frequency.   Recent consult visits: 10/14/21: Patient presented to Dr. Matilde Sprang (Urology) for bladder infection.  09/05/21: Patient  presented to Dr. Honor Junes (Endocrinology) for follow-up.   Hospital visits: 1/2-06/15/20: Patient hospitalized for Covid infection. Patient treated with IV decadron, Zofran, and Remdesivir.   Objective:  Lab Results  Component Value Date   CREATININE 0.73 05/17/2021   BUN 16 05/17/2021   GFRNONAA 67 08/01/2020   GFRAA 77 08/01/2020   NA 142 05/17/2021   K 4.3 05/17/2021   CALCIUM 10.1 05/17/2021   CO2 25 05/17/2021    Lab Results  Component Value Date/Time   HGBA1C 11.7 (H) 08/30/2021 10:49 AM   HGBA1C 9.1 (H) 05/17/2021 09:38 AM   MICROALBUR 50 08/31/2017 10:06 AM   MICROALBUR 50 03/05/2016 02:04 PM    Last diabetic Eye exam:  Lab Results  Component Value Date/Time   HMDIABEYEEXA No Retinopathy 03/06/2020 12:00 AM    Last diabetic Foot exam: No results found for: HMDIABFOOTEX   Lab Results  Component Value Date   CHOL 148 05/17/2021   HDL 27 (L) 05/17/2021   LDLCALC 95 05/17/2021   TRIG 146 05/17/2021   CHOLHDL 5.5 (H) 05/17/2021       Latest Ref Rng & Units 05/17/2021    9:38 AM 08/01/2020   11:27 AM 06/15/2020    2:48 AM  Hepatic Function  Total Protein 6.0 - 8.5 g/dL 7.0   7.1   6.4    Albumin 3.8 - 4.8 g/dL 4.3   4.3   2.8    AST 0 - 40 IU/L 6   8   11     ALT 0 - 32 IU/L 14   13   22     Alk Phosphatase 44 - 121 IU/L 103   64   45    Total Bilirubin  0.0 - 1.2 mg/dL 0.2   0.3   0.6      Lab Results  Component Value Date/Time   TSH 2.190 09/16/2018 11:12 AM   TSH 2.510 08/31/2017 10:50 AM       Latest Ref Rng & Units 05/17/2021    9:38 AM 08/01/2020   11:27 AM 06/10/2020    5:51 PM  CBC  WBC 3.4 - 10.8 x10E3/uL 7.2   6.5   10.7    Hemoglobin 11.1 - 15.9 g/dL 11.6   11.0   11.2    Hematocrit 34.0 - 46.6 % 34.6   33.2   33.9    Platelets 150 - 450 x10E3/uL 315   243   308      No results found for: VD25OH  Clinical ASCVD: No  The 10-year ASCVD risk score (Arnett DK, et al., 2019) is: 9.5%   Values used to calculate the score:     Age: 61 years      Sex: Female     Is Non-Hispanic African American: Yes     Diabetic: Yes     Tobacco smoker: No     Systolic Blood Pressure: 98 mmHg     Is BP treated: Yes     HDL Cholesterol: 27 mg/dL     Total Cholesterol: 148 mg/dL       05/17/2021    8:49 AM 10/09/2020    9:47 AM 08/01/2020   10:58 AM  Depression screen PHQ 2/9  Decreased Interest 0 0 0  Down, Depressed, Hopeless 0 0 0  PHQ - 2 Score 0 0 0  Altered sleeping 0 0 0  Tired, decreased energy 0 0 0  Change in appetite 0 0 0  Feeling bad or failure about yourself  0 0 0  Trouble concentrating 0 0 0  Moving slowly or fidgety/restless 0 0 0  Suicidal thoughts 0 0 0  PHQ-9 Score 0 0 0  Difficult doing work/chores Not difficult at all Not difficult at all Not difficult at all     Social History   Tobacco Use  Smoking Status Former   Packs/day: 0.25   Years: 21.00   Pack years: 5.25   Types: Cigarettes   Quit date: 06/08/1997   Years since quitting: 24.3  Smokeless Tobacco Never   BP Readings from Last 3 Encounters:  10/14/21 98/63  10/10/21 115/83  08/30/21 (!) 136/57   Pulse Readings from Last 3 Encounters:  10/14/21 63  10/10/21 81  08/30/21 85   Wt Readings from Last 3 Encounters:  10/10/21 219 lb 4.8 oz (99.5 kg)  08/30/21 227 lb 12.8 oz (103.3 kg)  05/17/21 232 lb 4.8 oz (105.4 kg)    Assessment/Interventions: Review of patient past medical history, allergies, medications, health status, including review of consultants reports, laboratory and other test data, was performed as part of comprehensive evaluation and provision of chronic care management services.   SDOH:  (Social Determinants of Health) assessments and interventions performed: Yes      CCM Care Plan  Allergies  Allergen Reactions   Allegra [Fexofenadine] Nausea And Vomiting   Avelox [Moxifloxacin] Other (See Comments)    Hallucinations   Bactrim [Sulfamethoxazole-Trimethoprim] Other (See Comments)    unknown   Etodolac Nausea Only    Fish-Derived Products Nausea And Vomiting    With "seafood"   Penicillins Diarrhea and Nausea And Vomiting   Shellfish Allergy Nausea Only   Sulfa Antibiotics Other (See Comments)    unknown  Macrobid [employer] Macro] Other (See Comments)    Constipation and globus hystericus.    Medications Reviewed Today     Reviewed by Germaine Pomfret, [facility] (Pharmacist) on 10/21/21 at 1338  Med List Status: <None>   Medication Order Taking? Sig Documenting Provider Last Dose Status Informant  albuterol (VENTOLIN HFA) 108 (90 Base) MCG/ACT inhaler [id number]  INHALE 2 PUFFS BY MOUTH EVERY 6 HOURS AS NEEDED Mar Daring, PA-C  Active   ascorbic Acid (VITAMIN C) 500 MG CPCR [id number]  Take 500 mg by mouth daily. [provider]  Active   aspirin EC 81 MG tablet [id number]  Take 1 tablet (81 mg total) by mouth daily. Swallow whole. Mar Daring, PA-C  Active   Blood Glucose Monitoring Suppl [facility] VERIO) w/Device Drucie Opitz [id number]  To check blood sugar once daily Mar Daring, [place]  Active   cefdinir (OMNICEF) 300 MG capsule [id number]  Take 1 capsule (300 mg total) by mouth 2 (two) times daily. Gwyneth Sprout, FNP  Active   cetirizine (ZYRTEC) 10 MG tablet [id number]  Take 1 tablet (10 mg total) by mouth daily. Margarita Rana, MD  Active Other  Cholecalciferol (VITAMIN D3) 50 MCG (2000 UT) TABS [id number]  Take 2,000 Units by mouth. [provider]  Active   conjugated estrogens (PREMARIN) vaginal cream [id number]  Place 1 Applicatorful vaginally daily. Gwyneth Sprout, FNP  Active   COVID-19 mRNA bivalent vaccine, Pfizer, (PFIZER COVID-19 [facility] BIVALENT) injection [id number]  Inject into the muscle. Carlyle Basques, MD  Active   Cyanocobalamin (VITAMIN B-12) 3000 MCG SUBL [id number]  Place 1 tablet under the tongue daily at 6 (six) AM. Gummies [provider]  Active   dapagliflozin propanediol (FARXIGA) 10 MG TABS tablet [id number] Yes Take 1 tablet  (10 mg total) by mouth daily before breakfast. Gwyneth Sprout, FNP Taking Active   estradiol (ESTRACE VAGINAL) 0.1 MG/GM vaginal cream [id number]  Place 1 Applicatorful vaginally at bedtime. Gwyneth Sprout, FNP  Active   fluconazole (DIFLUCAN) 150 MG tablet [id number]  Take for vaginal yeast, repeat dose in 4 days is symptoms remain. Gwyneth Sprout, FNP  Active   glipiZIDE (GLUCOTROL) 10 MG tablet [id number] Yes TAKE 1 TABLET BY MOUTH  TWICE DAILY BEFORE A MEAL Gwyneth Sprout, FNP Taking Active   glucose blood [facility] VERIO) test strip [id number]  To check blood sugar once daily [name] T, FNP  Active   HYDROcodone-acetaminophen (NORCO/VICODIN) 5-325 MG tablet [id number]  Take 1 tablet by mouth every 6 (six) hours as needed. [provider]  Active   ibuprofen (ADVIL) 200 MG tablet [id number]  Take 400 mg by mouth every 6 (six) hours as needed. [provider]  Active   Lancets Glory Rosebush ULTRASOFT) lancets [id number]  To check BS once daily Rubye [place]  Active   lisinopril-hydrochlorothiazide (ZESTORETIC) 20-12.5 MG tablet [id number]  Take 2 tablets by mouth once daily [name] T, FNP  Active   metroNIDAZOLE (FLAGYL) 500 MG tablet [id number]  Take 1 tablet (500 mg total) by mouth 2 (two) times daily for 7 days. Gwyneth Sprout, FNP  Active   montelukast (SINGULAIR) 10 MG tablet [id number]  Take 1 tablet by mouth once daily [name] T, FNP  Active   pantoprazole (PROTONIX) 40 MG tablet [id number]  Take 1 tablet by mouth once daily [name] T, FNP  Active   rosuvastatin (CRESTOR) 10 MG tablet [id number]  Take 1 tablet (10 mg total)  by mouth daily. Gwyneth Sprout, FNP  Active   spironolactone (ALDACTONE) 25 MG tablet [id number]  Take 1 tablet (25 mg total) by mouth daily. Gwyneth Sprout, FNP  Active   SUMAtriptan (IMITREX) 50 MG tablet [id number]  Take 1 tablet (50 mg total) by mouth every 2 (two) hours as needed for migraine. No more than $RemoveB'200mg'XvlTICwN$  in 24 hours Mar Daring, [place]  Active Other    Discontinued 10/21/21 1338 (Discontinued by provider)   zinc gluconate 50 MG tablet [id number]  Take 50 mg by mouth daily. [provider]  Active             Patient Active Problem List   Diagnosis Date Noted   Vaginal dryness, menopausal 10/10/2021   Vaginal discharge 10/10/2021   Acute cystitis with hematuria 10/10/2021   Urinary frequency 08/30/2021   Urge incontinence of urine 08/30/2021   Vaginal yeast infection 08/30/2021   Annual physical exam 05/17/2021   Screening for cervical cancer 05/17/2021   Encounter for screening mammogram for malignant neoplasm of breast 05/17/2021   Elevated LDL cholesterol level 05/17/2021   Iron deficiency anemia secondary to inadequate dietary iron intake 05/17/2021   Hyperlipidemia associated with type 2 diabetes mellitus ([place]) 05/17/2021   Type 2 diabetes mellitus with hyperglycemia, without long-term current use of insulin ([place]) 05/17/2021   Need for shingles vaccine 05/17/2021   Routine screening for STI (sexually transmitted infection) 05/17/2021   Itchy skin 05/17/2021   Dry skin dermatitis 05/17/2021   Pain due to onychomycosis of toenails of both feet 00/86/7619   History of Helicobacter pylori infection 11/05/2017   Heartburn    Diverticulosis of large intestine without diverticulitis    Warts of foot 06/30/2017   Hallux rigidus of left foot 01/28/2017   Hallux rigidus, right foot 01/28/2017   Pes planus 01/28/2017   Diabetic neuropathy ([place]) 11/18/2014   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   Hypercholesteremia 11/18/2014   Personal history of breast cancer 10/04/2014    Immunization History  Administered Date(s) Administered   PFIZER Comirnaty(Gray Top)Covid-19 Tri-Sucrose Vaccine 08/15/2020   PFIZER(Purple Top)SARS-COV-2 Vaccination 07/31/2019, 09/13/2019   Pfizer Covid-19 Vaccine Bivalent Booster 50yrs & up 03/28/2021   Pneumococcal Polysaccharide-23 06/22/2009, 08/06/2015   Tdap  08/31/2017   Zoster Recombinat (Shingrix) 08/15/2021    Conditions to be addressed/monitored:  Hypertension, Hyperlipidemia, Diabetes, GERD, Asthma and Osteoarthritis  Care Plan : General Pharmacy (Adult)  Updates made by Germaine Pomfret, [place] since 10/22/2021 12:00 AM     Problem: Hypertension, Hyperlipidemia, Diabetes, GERD, Asthma and Osteoarthritis   Priority: High     Long-Range Goal: Patient-Specific Goal   Start Date: 07/23/2020  Expected End Date: 01/25/2022  This Visit's Progress: On track  Recent Progress: On track  Priority: High  Note:   Current Barriers:  Unable to independently afford treatment regimen  Pharmacist Clinical Goal(s):  Over the next 90 days, patient will verbalize ability to afford treatment regimen maintain control of Diabetes as evidenced by A1c less than 8%  through collaboration with PharmD and provider.    Interventions: 1:1 collaboration with [name], FNP regarding development and update of comprehensive plan of care as evidenced by provider attestation and co-signature Inter-disciplinary care team collaboration (see longitudinal plan of care) Comprehensive medication review performed; medication list updated in electronic medical record  Hypertension (BP goal <140/90) -Controlled -Current treatment: Lisinopril-HCTZ 20-12.5 mg 2 tablets daily  Spironolactone 25 mg daily  Verapamil 80 mg daily  -Medications previously tried: Amlodipine, spironolactone   -  Current home readings: Does not monitor at home.  -Current dietary habits: Limiting fried food. Eating more vegetables  -Current exercise habits: PT weekly -Denies hypotensive/hypertensive symptoms. Does report mild dizziness when bending over. Resolves within a minute.  -Last three office readings have been low/borderline low.  -STOP Verapamil. Instructed patient to begin monitoring blood pressure at home or [employer] 1-2 times weekly.   Hyperlipidemia: (LDL goal <  70) -uncontrolled -Current treatment: Rosuvastatin 10 mg daily -Medications previously tried: Pravastatin, Lovastatin Importance of limiting foods high in cholesterol; -Recommended rechecking lipid panel at PCP follow-up.   Diabetes (A1c goal <8%) -Managed by Mee Hives -Uncontrolled -Current medications: Farxiga 10 mg daily  Glipizide 10 mg twice daily -Medications previously tried: Januvia, Metformin (diarrhea), Pioglitazone (Edema), Trulicity, Rybelsus (ineffective) -Current home glucose readings fasting glucose: 170s-180s -Denies hypoglycemic/hyperglycemic symptoms -Limits concentrated carbohydrates and sweets. Working on portion control.  -Discussed PLATE method, portion control at length this visit.  -Refill of Farxiga sent in for AZ&ME patient assistance. -Recommended retrial of Ozempic or initiation of once daily insulin, patient hesitant to add more medications today. Will defer, but urged patient on the importance of improving blood sugars as soon as possible to minimize risks of complications.    Asthma (Goal: control symptoms and prevent exacerbations) -controlled -Current treatment  Ventolin HFA 108 mcg/act 2 puff every 6 hours as needed  Montelukast 10 mg daily  -Medications previously tried: NA  -Pulmonary function testing: NA -Exacerbations requiring treatment in last 6 months: Yes, due to recent covid infection -Patient denies consistent use of maintenance inhaler -Frequency of rescue inhaler use: twice weekly -Counseled on Proper inhaler technique; When to use rescue inhaler -Recommended to continue current medication  GERD (Goal: prevent symptoms of heartburn and reflux) -controlled -Current treatment  Pantoprazole 40 mg daily  -Medications previously tried: NA  -Recommended to continue current medication  Osteoarthritis (Goal: minimize arthritis pain) -Controlled -Current treatment  Acetaminophen CR 650 mg every 8 hours as needed Ibuprofen 200  mg 2 tablets every 6 hours as needed  -Counseled to avoid greater than 3000 mg of acetaminophen daily  -Recommended to continue current medication  Patient Goals/Self-Care Activities Over the next 90 days, patient will:  - check glucose daily (before breakfast), document, and provide at future appointments  Follow Up Plan: Telephone follow up appointment with care management team member scheduled for:  10/21/2021 at 3:45 PM    Medication Assistance:  Wilder Glade obtained through AZ&ME medication assistance program.  Enrollment ends Dec 2023  Patient's preferred pharmacy is:  [facility] [address] ([address]), [place]) [place] [address] Phone: [phone] Fax: [phone]  [facility] - [place], [place] [facility] [address] [place] [address] Phone: [phone] Fax: [phone]  [facility] ([facility] ) - [place], [place] [place] [address] Phone: [phone] Fax: [place], [place] [place] [address] Phone: [phone] Fax: [phone]   Uses pill box? Yes Pt endorses 100% compliance  We discussed: Current pharmacy is preferred with insurance plan and patient is satisfied with pharmacy services Patient decided to: Continue current medication management strategy  Care Plan and Follow Up Patient Decision:  Patient agrees to Care Plan and Follow-up.  Plan: Telephone follow up appointment with  care management team member scheduled for:  10/21/2021 at 3:45 PM  [name], PharmD, Para [place], [place] [phone]

## 2021-10-22 ENCOUNTER — Telehealth: Payer: Self-pay

## 2021-10-22 MED ORDER — DAPAGLIFLOZIN PROPANEDIOL 10 MG PO TABS: 10.0000 mg | ORAL_TABLET | Freq: Every day | ORAL | 3 refills | Status: DC

## 2021-10-22 NOTE — Patient Instructions (Signed)
Visit Information ?It was great speaking with you today!  Please let me know if you have any questions about our visit. ? ? Goals Addressed   ? ?  ?  ?  ?  ? This Visit's Progress  ?  Monitor and Manage My Blood Sugar-Diabetes Type 2   On track  ?  Timeframe:  Long-Range Goal ?Priority:  High ?Start Date:    07/23/2020                         ?Expected End Date:   01/20/2022                   ? ?Follow Up within 30 days  ?  ?- check blood sugar at prescribed times ?- check blood sugar if I feel it is too high or too low ?- take the blood sugar log to all doctor visits  ?  ?Why is this important?   ?Checking your blood sugar at home helps to keep it from getting very high or very low.  ?Writing the results in a diary or log helps the doctor know how to care for you.  ?Your blood sugar log should have the time, date and the results.  ?Also, write down the amount of insulin or other medicine that you take.  ?Other information, like what you ate, exercise done and how you were feeling, will also be helpful.   ?  ?Notes:  ?  ? ?  ? ? ?Patient Care Plan: General Pharmacy (Adult)  ?  ? ?Problem Identified: Hypertension, Hyperlipidemia, Diabetes, GERD, Asthma and Osteoarthritis   ?Priority: High  ?  ? ?Long-Range Goal: Patient-Specific Goal   ?Start Date: 07/23/2020  ?Expected End Date: 01/25/2022  ?This Visit's Progress: On track  ?Recent Progress: On track  ?Priority: High  ?Note:   ?Current Barriers:  ?Unable to independently afford treatment regimen ? ?Pharmacist Clinical Goal(s):  ?Over the next 90 days, patient will verbalize ability to afford treatment regimen ?maintain control of Diabetes as evidenced by A1c less than 8%  through collaboration with PharmD and provider.  ? ? ?Interventions: ?1:1 collaboration with [name], FNP regarding development and update of comprehensive plan of care as evidenced by provider attestation and co-signature ?Inter-disciplinary care team collaboration (see longitudinal plan of  care) ?Comprehensive medication review performed; medication list updated in electronic medical record ? ?Hypertension (BP goal <140/90) ?-Controlled ?-Current treatment: ?Lisinopril-HCTZ 20-12.5 mg 2 tablets daily  ?Spironolactone 25 mg daily  ?Verapamil 80 mg daily  ?-Medications previously tried: Amlodipine, spironolactone   ?-Current home readings: Does not monitor at home.  ?-Current dietary habits: Limiting fried food. Eating more vegetables  ?-Current exercise habits: PT weekly ?-Denies hypotensive/hypertensive symptoms. Does report mild dizziness when bending over. Resolves within a minute.  ?-Last three office readings have been low/borderline low.  ?-STOP Verapamil. Instructed patient to begin monitoring blood pressure at home or [employer] 1-2 times weekly.  ? ?Hyperlipidemia: (LDL goal < 70) ?-uncontrolled ?-Current treatment: ?Rosuvastatin 10 mg daily ?-Medications previously tried: Pravastatin, Lovastatin ?Importance of limiting foods high in cholesterol; ?-Recommended rechecking lipid panel at PCP follow-up.  ? ?Diabetes (A1c goal <8%) ?-Managed by Mee Hives ?-Uncontrolled ?-Current medications: ?Farxiga 10 mg daily  ?Glipizide 10 mg twice daily ?-Medications previously tried: Januvia, Metformin (diarrhea), Pioglitazone (Edema), Trulicity, Rybelsus (ineffective) ?-Current home glucose readings ?fasting glucose: 170s-180s ?-Denies hypoglycemic/hyperglycemic symptoms ?-Limits concentrated carbohydrates and sweets. Working on portion control.  ?-Discussed PLATE method, portion  control at length this visit.  ?-Refill of Farxiga sent in for AZ&ME patient assistance. ?-Recommended retrial of Ozempic or initiation of once daily insulin, patient hesitant to add more medications today. Will defer, but urged patient on the importance of improving blood sugars as soon as possible to minimize risks of complications.  ?  ?Asthma (Goal: control symptoms and prevent exacerbations) ?-controlled ?-Current  treatment  ?Ventolin HFA 108 mcg/act 2 puff every 6 hours as needed  ?Montelukast 10 mg daily  ?-Medications previously tried: NA  ?-Pulmonary function testing: NA ?-Exacerbations requiring treatment in last 6 months: Yes, due to recent covid infection ?-Patient denies consistent use of maintenance inhaler ?-Frequency of rescue inhaler use: twice weekly ?-Counseled on Proper inhaler technique; ?When to use rescue inhaler ?-Recommended to continue current medication ? ?GERD (Goal: prevent symptoms of heartburn and reflux) ?-controlled ?-Current treatment  ?Pantoprazole 40 mg daily  ?-Medications previously tried: NA  ?-Recommended to continue current medication ? ?Osteoarthritis (Goal: minimize arthritis pain) ?-Controlled ?-Current treatment  ?Acetaminophen CR 650 mg every 8 hours as needed ?Ibuprofen 200 mg 2 tablets every 6 hours as needed  ?-Counseled to avoid greater than 3000 mg of acetaminophen daily  ?-Recommended to continue current medication ? ?Patient Goals/Self-Care Activities ?Over the next 90 days, patient will:  ?- check glucose daily (before breakfast), document, and provide at future appointments ? ?Follow Up Plan: Telephone follow up appointment with care management team member scheduled for:  10/21/2021 at 3:45 PM ?  ? ? ? ?Patient agreed to services and verbal consent obtained.  ? ?The patient verbalized understanding of instructions, educational materials, and care plan provided today and declined offer to receive copy of patient instructions, educational materials, and care plan.  ? ?[name], PharmD, BCACP, CPP  ?Clinical Pharmacist Practitioner  ?[place] ?[phone]  ?

## 2021-10-22 NOTE — Progress Notes (Signed)
Per Clinical pharmacist, please reach out to AZ&ME to confirm they have received and submitted the prescription that was sent today 10/22/2021.  Per AZ&ME, Patient was approved for patient assistance for Farxiga on 10/15/2021 and end date is 06/08/2022. Prescription can take up to 2-3 business days before it will show up in patient account for e-scribe. I will check back with AZ&ME on Friday 10/25/2021 to confirm they have received prescription.  10/25/2021:  Per AZ&ME, patient prescription was received, but either patient of some one from the provider office will have to request the first shipment as the rest will be auto filled.I requested the first shipment to be ship to the provider office which will take 10-14 business days.  [place] Pharmacist Assistant [phone]

## 2021-10-24 ENCOUNTER — Ambulatory Visit: Payer: Medicare Other | Admitting: Family Medicine

## 2021-10-28 DIAGNOSIS — D0512 Intraductal carcinoma in situ of left breast: Secondary | ICD-10-CM | POA: Diagnosis not present

## 2021-10-28 DIAGNOSIS — E118 Type 2 diabetes mellitus with unspecified complications: Secondary | ICD-10-CM | POA: Diagnosis not present

## 2021-10-28 DIAGNOSIS — Z86000 Personal history of in-situ neoplasm of breast: Secondary | ICD-10-CM | POA: Diagnosis not present

## 2021-10-28 DIAGNOSIS — I10 Essential (primary) hypertension: Secondary | ICD-10-CM | POA: Diagnosis not present

## 2021-10-28 DIAGNOSIS — K219 Gastro-esophageal reflux disease without esophagitis: Secondary | ICD-10-CM | POA: Diagnosis not present

## 2021-10-28 DIAGNOSIS — Z08 Encounter for follow-up examination after completed treatment for malignant neoplasm: Secondary | ICD-10-CM | POA: Diagnosis not present

## 2021-10-30 ENCOUNTER — Ambulatory Visit
Admission: RE | Admit: 2021-10-30 | Discharge: 2021-10-30 | Disposition: A | Discharge status: Home / Self Care | Payer: Medicare Other | Source: Ambulatory Visit | Attending: Family Medicine | Admitting: Family Medicine

## 2021-10-30 DIAGNOSIS — N390 Urinary tract infection, site not specified: Secondary | ICD-10-CM | POA: Insufficient documentation

## 2021-10-30 DIAGNOSIS — N3941 Urge incontinence: Secondary | ICD-10-CM

## 2021-10-30 DIAGNOSIS — Z87440 Personal history of urinary (tract) infections: Secondary | ICD-10-CM | POA: Diagnosis not present

## 2021-11-06 DIAGNOSIS — E1159 Type 2 diabetes mellitus with other circulatory complications: Secondary | ICD-10-CM

## 2021-11-06 DIAGNOSIS — J45909 Unspecified asthma, uncomplicated: Secondary | ICD-10-CM

## 2021-11-06 DIAGNOSIS — M1990 Unspecified osteoarthritis, unspecified site: Secondary | ICD-10-CM

## 2021-11-06 DIAGNOSIS — E785 Hyperlipidemia, unspecified: Secondary | ICD-10-CM

## 2021-11-06 DIAGNOSIS — Z7984 Long term (current) use of oral hypoglycemic drugs: Secondary | ICD-10-CM

## 2021-11-06 DIAGNOSIS — I10 Essential (primary) hypertension: Secondary | ICD-10-CM

## 2021-11-07 ENCOUNTER — Telehealth: Payer: Self-pay | Admitting: Family Medicine

## 2021-11-12 ENCOUNTER — Other Ambulatory Visit: Payer: Self-pay | Admitting: Family Medicine

## 2021-11-12 DIAGNOSIS — K219 Gastro-esophageal reflux disease without esophagitis: Secondary | ICD-10-CM

## 2021-11-12 DIAGNOSIS — J40 Bronchitis, not specified as acute or chronic: Secondary | ICD-10-CM

## 2021-11-13 ENCOUNTER — Telehealth: Payer: Self-pay

## 2021-11-13 NOTE — Progress Notes (Signed)
Chronic Care Management APPOINTMENT REMINDER   Called XIN KLAWITTER, No answer, left message of appointment on 11/15/2021 at 1:00 pm via telephone visit with [name] , Pharm D. Notified to have all medications, supplements, blood pressure and/or blood sugar logs available during appointment and to return call if need to reschedule.  [place] Pharmacist Assistant [phone]

## 2021-11-14 ENCOUNTER — Ambulatory Visit: Payer: Medicare Other | Admitting: Dermatology

## 2021-11-15 ENCOUNTER — Ambulatory Visit (INDEPENDENT_AMBULATORY_CARE_PROVIDER_SITE_OTHER): Payer: Medicare Other

## 2021-11-15 ENCOUNTER — Telehealth: Payer: Self-pay

## 2021-11-15 DIAGNOSIS — E1165 Type 2 diabetes mellitus with hyperglycemia: Secondary | ICD-10-CM

## 2021-11-15 DIAGNOSIS — I10 Essential (primary) hypertension: Secondary | ICD-10-CM

## 2021-11-15 NOTE — Telephone Encounter (Signed)
Incoming call from pt who states that she never received a call on her RUS results or urine culture. Advised pt that RUS and urine cx appear to be normal, however I will check with provider to ensure results are normal. Please review.

## 2021-11-15 NOTE — Progress Notes (Signed)
 Chronic Care Management Pharmacy Note  11/26/2021 Name:  Alexis Lloyd MRN:  [id number] DOB:  12/01/1960  Summary: Patient presents for CCM follow-up. -Dizziness symptoms have improved with discontinuation of verapamil, energy improved  Recommendations/Changes made from today's visit: Continue current medications -Recommended rechecking lipid panel at PCP follow-up.   Plan: CPP follow-up 1 month   Subjective: Alexis Lloyd is an 61 y.o. year old female who is a primary patient of Payne, Elise T, FNP.  The CCM team was consulted for assistance with disease management and care coordination needs.    Engaged with patient by telephone for follow up visit in response to provider referral for pharmacy case management and/or care coordination services.   Consent to Services:  The patient was given information about Chronic Care Management services, agreed to services, and gave verbal consent prior to initiation of services.  Please see initial visit note for detailed documentation.   Patient Care Team: Payne, Elise T, FNP as PCP - General (Family Medicine) Fryer, Margaret M, CNM as Midwife (Obstetrics) Fleury, Alexandre A, RPH (Pharmacist) O'Connell, Thomas L, MD as Consulting Physician (Endocrinology)  Recent office visits: 10/10/21: Patient presented to Elise Payne, FNP for cystitis.  08/30/21: Patient presented to Elise Payne, FNP for urinary frequency.   Recent consult visits: 10/14/21: Patient presented to Dr. MacDiarmid (Urology) for bladder infection.  09/05/21: Patient presented to Dr. O'Connell (Endocrinology) for follow-up.   Hospital visits: 1/2-06/15/20: Patient hospitalized for Covid infection. Patient treated with IV decadron, Zofran, and Remdesivir.   Objective:  Lab Results  Component Value Date   CREATININE 0.73 05/17/2021   BUN 16 05/17/2021   GFRNONAA 67 08/01/2020   GFRAA 77 08/01/2020   NA 142 05/17/2021   K 4.3 05/17/2021   CALCIUM 10.1 05/17/2021   CO2  25 05/17/2021    Lab Results  Component Value Date/Time   HGBA1C 11.7 (H) 08/30/2021 10:49 AM   HGBA1C 9.1 (H) 05/17/2021 09:38 AM   MICROALBUR 50 08/31/2017 10:06 AM   MICROALBUR 50 03/05/2016 02:04 PM    Last diabetic Eye exam:  Lab Results  Component Value Date/Time   HMDIABEYEEXA No Retinopathy 03/06/2020 12:00 AM    Last diabetic Foot exam: No results found for: "HMDIABFOOTEX"   Lab Results  Component Value Date   CHOL 148 05/17/2021   HDL 27 (L) 05/17/2021   LDLCALC 95 05/17/2021   TRIG 146 05/17/2021   CHOLHDL 5.5 (H) 05/17/2021       Latest Ref Rng & Units 05/17/2021    9:38 AM 08/01/2020   11:27 AM 06/15/2020    2:48 AM  Hepatic Function  Total Protein 6.0 - 8.5 g/dL 7.0  7.1  6.4   Albumin 3.8 - 4.8 g/dL 4.3  4.3  2.8   AST 0 - 40 IU/L 6  8  11   ALT 0 - 32 IU/L 14  13  22   Alk Phosphatase 44 - 121 IU/L 103  64  45   Total Bilirubin 0.0 - 1.2 mg/dL 0.2  0.3  0.6     Lab Results  Component Value Date/Time   TSH 2.190 09/16/2018 11:12 AM   TSH 2.510 08/31/2017 10:50 AM       Latest Ref Rng & Units 05/17/2021    9:38 AM 08/01/2020   11:27 AM 06/10/2020    5:51 PM  CBC  WBC 3.4 - 10.8 x10E3/uL 7.2  6.5  10.7   Hemoglobin 11.1 - 15.9 g/dL 11.6  11.0    11.2   Hematocrit 34.0 - 46.6 % 34.6  33.2  33.9   Platelets 150 - 450 x10E3/uL 315  243  308     No results found for: "VD25OH"  Clinical ASCVD: No  The 10-year ASCVD risk score (Arnett DK, et al., 2019) is: 13.8%   Values used to calculate the score:     Age: 47 years     Sex: Female     Is Non-Hispanic African American: Yes     Diabetic: Yes     Tobacco smoker: No     Systolic Blood Pressure: 621 mmHg     Is BP treated: Yes     HDL Cholesterol: 27 mg/dL     Total Cholesterol: 148 mg/dL       05/17/2021    8:49 AM 10/09/2020    9:47 AM 08/01/2020   10:58 AM  Depression screen PHQ 2/9  Decreased Interest 0 0 0  Down, Depressed, Hopeless 0 0 0  PHQ - 2 Score 0 0 0  Altered sleeping 0 0 0   Tired, decreased energy 0 0 0  Change in appetite 0 0 0  Feeling bad or failure about yourself  0 0 0  Trouble concentrating 0 0 0  Moving slowly or fidgety/restless 0 0 0  Suicidal thoughts 0 0 0  PHQ-9 Score 0 0 0  Difficult doing work/chores Not difficult at all Not difficult at all Not difficult at all     Social History   Tobacco Use  Smoking Status Former   Packs/day: 0.25   Years: 21.00   Total pack years: 5.25   Types: Cigarettes   Quit date: 06/08/1997   Years since quitting: 24.4  Smokeless Tobacco Never   BP Readings from Last 3 Encounters:  10/14/21 98/63  10/10/21 115/83  08/30/21 (!) 136/57   Pulse Readings from Last 3 Encounters:  10/14/21 63  10/10/21 81  08/30/21 85   Wt Readings from Last 3 Encounters:  10/10/21 219 lb 4.8 oz (99.5 kg)  08/30/21 227 lb 12.8 oz (103.3 kg)  05/17/21 232 lb 4.8 oz (105.4 kg)    Assessment/Interventions: Review of patient past medical history, allergies, medications, health status, including review of consultants reports, laboratory and other test data, was performed as part of comprehensive evaluation and provision of chronic care management services.   SDOH:  (Social Determinants of Health) assessments and interventions performed: Yes      CCM Care Plan  Allergies  Allergen Reactions   Allegra [Fexofenadine] Nausea And Vomiting   Avelox [Moxifloxacin] Other (See Comments)    Hallucinations   Bactrim [Sulfamethoxazole-Trimethoprim] Other (See Comments)    unknown   Etodolac Nausea Only   Fish-Derived Products Nausea And Vomiting    With "seafood"   Penicillins Diarrhea and Nausea And Vomiting   Shellfish Allergy Nausea Only   Sulfa Antibiotics Other (See Comments)    unknown   Macrobid [Nitrofurantoin Monohyd Macro] Other (See Comments)    Constipation and globus hystericus.    Medications Reviewed Today     Reviewed by Germaine Pomfret, [facility] (Pharmacist) on 10/21/21 at 1338  Med List Status:  <None>   Medication Order Taking? Sig Documenting Provider Last Dose Status Informant  albuterol (VENTOLIN HFA) 108 (90 Base) MCG/ACT inhaler [id number]  INHALE 2 PUFFS BY MOUTH EVERY 6 HOURS AS NEEDED Mar Daring, PA-C  Active   ascorbic Acid (VITAMIN C) 500 MG CPCR [id number]  Take 500 mg by mouth daily. [provider]  Active   aspirin EC 81 MG tablet [id number]  Take 1 tablet (81 mg total) by mouth daily. Swallow whole. Burnette, Jennifer M, PA-C  Active   Blood Glucose Monitoring Suppl (ONETOUCH VERIO) w/Device KIT [id number]  To check blood sugar once daily Burnette, Jennifer M, PA-C  Active   cefdinir (OMNICEF) 300 MG capsule [id number]  Take 1 capsule (300 mg total) by mouth 2 (two) times daily. Payne, Elise T, FNP  Active   cetirizine (ZYRTEC) 10 MG tablet [id number]  Take 1 tablet (10 mg total) by mouth daily. Maloney, Nancy, MD  Active Other  Cholecalciferol (VITAMIN D3) 50 MCG (2000 UT) TABS [id number]  Take 2,000 Units by mouth. [provider]  Active   conjugated estrogens (PREMARIN) vaginal cream [id number]  Place 1 Applicatorful vaginally daily. Payne, Elise T, FNP  Active   COVID-19 mRNA bivalent vaccine, Pfizer, (PFIZER COVID-19 VAC BIVALENT) injection [id number]  Inject into the muscle. Snider, Cynthia, MD  Active   Cyanocobalamin (VITAMIN B-12) 3000 MCG SUBL [id number]  Place 1 tablet under the tongue daily at 6 (six) AM. Gummies [provider]  Active   dapagliflozin propanediol (FARXIGA) 10 MG TABS tablet [id number] Yes Take 1 tablet (10 mg total) by mouth daily before breakfast. Payne, Elise T, FNP Taking Active   estradiol (ESTRACE VAGINAL) 0.1 MG/GM vaginal cream [id number]  Place 1 Applicatorful vaginally at bedtime. Payne, Elise T, FNP  Active   fluconazole (DIFLUCAN) 150 MG tablet [id number]  Take for vaginal yeast, repeat dose in 4 days is symptoms remain. Payne, Elise T, FNP  Active   glipiZIDE (GLUCOTROL) 10 MG tablet [id number] Yes TAKE 1  TABLET BY MOUTH  TWICE DAILY BEFORE A MEAL Payne, Elise T, FNP Taking Active   glucose blood (ONETOUCH VERIO) test strip [id number]  To check blood sugar once daily Payne, Elise T, FNP  Active   HYDROcodone-acetaminophen (NORCO/VICODIN) 5-325 MG tablet [id number]  Take 1 tablet by mouth every 6 (six) hours as needed. [provider]  Active   ibuprofen (ADVIL) 200 MG tablet [id number]  Take 400 mg by mouth every 6 (six) hours as needed. [provider]  Active   Lancets (ONETOUCH ULTRASOFT) lancets [id number]  To check BS once daily Burnette, Jennifer M, PA-C  Active   lisinopril-hydrochlorothiazide (ZESTORETIC) 20-12.5 MG tablet [id number]  Take 2 tablets by mouth once daily Payne, Elise T, FNP  Active   metroNIDAZOLE (FLAGYL) 500 MG tablet [id number]  Take 1 tablet (500 mg total) by mouth 2 (two) times daily for 7 days. Payne, Elise T, FNP  Active   montelukast (SINGULAIR) 10 MG tablet [id number]  Take 1 tablet by mouth once daily Payne, Elise T, FNP  Active   pantoprazole (PROTONIX) 40 MG tablet [id number]  Take 1 tablet by mouth once daily Payne, Elise T, FNP  Active   rosuvastatin (CRESTOR) 10 MG tablet [id number]  Take 1 tablet (10 mg total) by mouth daily. Payne, Elise T, FNP  Active   spironolactone (ALDACTONE) 25 MG tablet [id number]  Take 1 tablet (25 mg total) by mouth daily. Payne, Elise T, FNP  Active   SUMAtriptan (IMITREX) 50 MG tablet [id number]  Take 1 tablet (50 mg total) by mouth every 2 (two) hours as needed for migraine. No more than 200mg in 24 hours Burnette, Jennifer M, PA-C  Active Other    Discontinued 10/21/21 1338 (Discontinued by provider)   zinc gluconate 50 MG tablet [id number]  Take 50 mg by mouth daily. [provider]  Active             Patient Active Problem List   Diagnosis Date Noted   Vaginal dryness, menopausal 10/10/2021   Vaginal discharge 10/10/2021   Acute cystitis with hematuria 10/10/2021   Urinary frequency 08/30/2021   Urge  incontinence of urine 08/30/2021   Vaginal yeast infection 08/30/2021   Annual physical exam 05/17/2021   Screening for cervical cancer 05/17/2021   Encounter for screening mammogram for malignant neoplasm of breast 05/17/2021   Elevated LDL cholesterol level 05/17/2021   Iron deficiency anemia secondary to inadequate dietary iron intake 05/17/2021   Hyperlipidemia associated with type 2 diabetes mellitus (HCC) 05/17/2021   Type 2 diabetes mellitus with hyperglycemia, without long-term current use of insulin (HCC) 05/17/2021   Need for shingles vaccine 05/17/2021   Routine screening for STI (sexually transmitted infection) 05/17/2021   Itchy skin 05/17/2021   Dry skin dermatitis 05/17/2021   Pain due to onychomycosis of toenails of both feet 10/24/2019   History of Helicobacter pylori infection 11/05/2017   Heartburn    Diverticulosis of large intestine without diverticulitis    Warts of foot 06/30/2017   Hallux rigidus of left foot 01/28/2017   Hallux rigidus, right foot 01/28/2017   Pes planus 01/28/2017   Diabetic neuropathy (HCC) 11/18/2014   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   Hypercholesteremia 11/18/2014   Personal history of breast cancer 10/04/2014    Immunization History  Administered Date(s) Administered   PFIZER Comirnaty(Gray Top)Covid-19 Tri-Sucrose Vaccine 08/15/2020   PFIZER(Purple Top)SARS-COV-2 Vaccination 07/31/2019, 09/13/2019   Pfizer Covid-19 Vaccine Bivalent Booster 12yrs & up 03/28/2021   Pneumococcal Polysaccharide-23 06/22/2009, 08/06/2015   Tdap 08/31/2017   Zoster Recombinat (Shingrix) 08/15/2021    Conditions to be addressed/monitored:  Hypertension, Hyperlipidemia, Diabetes, GERD, Asthma and Osteoarthritis  Care Plan : General Pharmacy (Adult)  Updates made by Fleury, Alexandre A, RPH since 11/26/2021 12:00 AM     Problem: Hypertension, Hyperlipidemia, Diabetes, GERD, Asthma and Osteoarthritis   Priority: High     Long-Range Goal:  Patient-Specific Goal   Start Date: 07/23/2020  Expected End Date: 01/25/2022  This Visit's Progress: On track  Recent Progress: On track  Priority: High  Note:   Current Barriers:  Unable to independently afford treatment regimen  Pharmacist Clinical Goal(s):  Over the next 90 days, patient will verbalize ability to afford treatment regimen maintain control of Diabetes as evidenced by A1c less than 8%  through collaboration with PharmD and provider.    Interventions: 1:1 collaboration with Elise Payne, FNP regarding development and update of comprehensive plan of care as evidenced by provider attestation and co-signature Inter-disciplinary care team collaboration (see longitudinal plan of care) Comprehensive medication review performed; medication list updated in electronic medical record  Hypertension (BP goal <140/90) -Controlled -Current treatment: Lisinopril-HCTZ 20-12.5 mg 2 tablets daily  Spironolactone 25 mg daily  -Medications previously tried: Amlodipine, diltiazem (hypotension)   -Dizziness symptoms have improved with discontinuation of verapamil, energy improved overall. -Current home readings:   -Current dietary habits: Limiting fried food. Eating more vegetables  -Current exercise habits: PT weekly -Denies hypotensive/hypertensive symptoms.  -Continue current medications  Hyperlipidemia: (LDL goal < 70) -uncontrolled -Current treatment: Rosuvastatin 10 mg daily -Medications previously tried: Pravastatin, Lovastatin Importance of limiting foods high in cholesterol; -Recommended rechecking lipid panel at PCP follow-up.   Diabetes (A1c goal <8%) -Managed by Thomas O'Connell -Uncontrolled -Current medications: Farxiga 10 mg daily  Glipizide 10 mg twice daily -Medications previously tried: Januvia, Metformin (diarrhea), Pioglitazone (Edema),   Trulicity, Rybelsus (ineffective) -Current home glucose readings fasting glucose: 240s -Denies  hypoglycemic/hyperglycemic symptoms -Drinking water and ice tea.  -Continue current medications   Asthma (Goal: control symptoms and prevent exacerbations) -controlled -Current treatment  Ventolin HFA 108 mcg/act 2 puff every 6 hours as needed  Montelukast 10 mg daily  -Medications previously tried: NA  -Pulmonary function testing: NA -Exacerbations requiring treatment in last 6 months: Yes, due to recent covid infection -Patient denies consistent use of maintenance inhaler -Frequency of rescue inhaler use: twice weekly -Counseled on Proper inhaler technique; When to use rescue inhaler -Recommended to continue current medication  GERD (Goal: prevent symptoms of heartburn and reflux) -controlled -Current treatment  Pantoprazole 40 mg daily  -Medications previously tried: NA  -Recommended to continue current medication  Osteoarthritis (Goal: minimize arthritis pain) -Controlled -Current treatment  Acetaminophen CR 650 mg every 8 hours as needed Ibuprofen 200 mg 2 tablets every 6 hours as needed  -Counseled to avoid greater than 3000 mg of acetaminophen daily  -Recommended to continue current medication  Patient Goals/Self-Care Activities Over the next 90 days, patient will:  - check glucose daily (before breakfast), document, and provide at future appointments  Follow Up Plan: Telephone follow up appointment with care management team member scheduled for:  02/11/2022 at 2:15 PM     Medication Assistance:  Wilder Glade obtained through [place] medication assistance program.  Enrollment ends Dec 2023  Patient's preferred pharmacy is:  [facility] [place] [address]), [place]) [place] [address] Phone: [phone] Fax: [phone]  [facility] ([place], [place] [facility] [address] [place] [address] Phone: [phone] Fax: [phone]  [facility] ([facility] ) - [place], [place] [address] Phone: [phone] Fax: [place], [place] [address] [place] [address] Phone: [phone] Fax: [phone]  Uses pill box? Yes Pt endorses 100% compliance  We discussed: Current pharmacy is preferred with insurance plan and patient is satisfied with pharmacy services Patient decided to: Continue current medication management strategy  Care Plan and Follow Up Patient Decision:  Patient agrees to Care Plan and Follow-up.  Plan: Telephone follow up appointment with care management team member scheduled for:  02/11/2022 at 2:15 PM  [name], PharmD, Para [place], [place] [phone]

## 2021-11-26 NOTE — Patient Instructions (Signed)
Visit Information It was great speaking with you today!  Please let me know if you have any questions about our visit.   Goals Addressed             This Visit's Progress    Monitor and Manage My Blood Sugar-Diabetes Type 2   On track    Timeframe:  Long-Range Goal Priority:  High Start Date:    07/23/2020                         Expected End Date:   01/20/2022                    Follow Up within 30 days    - check blood sugar at prescribed times - check blood sugar if I feel it is too high or too low - take the blood sugar log to all doctor visits    Why is this important?   Checking your blood sugar at home helps to keep it from getting very high or very low.  Writing the results in a diary or log helps the doctor know how to care for you.  Your blood sugar log should have the time, date and the results.  Also, write down the amount of insulin or other medicine that you take.  Other information, like what you ate, exercise done and how you were feeling, will also be helpful.     Notes:         Patient Care Plan: General Pharmacy (Adult)     Problem Identified: Hypertension, Hyperlipidemia, Diabetes, GERD, Asthma and Osteoarthritis   Priority: High     Long-Range Goal: Patient-Specific Goal   Start Date: 07/23/2020  Expected End Date: 01/25/2022  This Visit's Progress: On track  Recent Progress: On track  Priority: High  Note:   Current Barriers:  Unable to independently afford treatment regimen  Pharmacist Clinical Goal(s):  Over the next 90 days, patient will verbalize ability to afford treatment regimen maintain control of Diabetes as evidenced by A1c less than 8%  through collaboration with PharmD and provider.    Interventions: 1:1 collaboration with [name], FNP regarding development and update of comprehensive plan of care as evidenced by provider attestation and co-signature Inter-disciplinary care team collaboration (see longitudinal plan of  care) Comprehensive medication review performed; medication list updated in electronic medical record  Hypertension (BP goal <140/90) -Controlled -Current treatment: Lisinopril-HCTZ 20-12.5 mg 2 tablets daily  Spironolactone 25 mg daily  -Medications previously tried: Amlodipine, diltiazem (hypotension)   -Dizziness symptoms have improved with discontinuation of verapamil, energy improved overall. -Current home readings:   -Current dietary habits: Limiting fried food. Eating more vegetables  -Current exercise habits: PT weekly -Denies hypotensive/hypertensive symptoms.  -Continue current medications  Hyperlipidemia: (LDL goal < 70) -uncontrolled -Current treatment: Rosuvastatin 10 mg daily -Medications previously tried: Pravastatin, Lovastatin Importance of limiting foods high in cholesterol; -Recommended rechecking lipid panel at PCP follow-up.   Diabetes (A1c goal <8%) -Managed by Mee Hives -Uncontrolled -Current medications: Farxiga 10 mg daily  Glipizide 10 mg twice daily -Medications previously tried: Januvia, Metformin (diarrhea), Pioglitazone (Edema), Trulicity, Rybelsus (ineffective) -Current home glucose readings fasting glucose: 240s -Denies hypoglycemic/hyperglycemic symptoms -Drinking water and ice tea.  -Continue current medications   Asthma (Goal: control symptoms and prevent exacerbations) -controlled -Current treatment  Ventolin HFA 108 mcg/act 2 puff every 6 hours as needed  Montelukast 10 mg daily  -Medications previously tried: NA  -Pulmonary  function testing: NA -Exacerbations requiring treatment in last 6 months: Yes, due to recent covid infection -Patient denies consistent use of maintenance inhaler -Frequency of rescue inhaler use: twice weekly -Counseled on Proper inhaler technique; When to use rescue inhaler -Recommended to continue current medication  GERD (Goal: prevent symptoms of heartburn and reflux) -controlled -Current  treatment  Pantoprazole 40 mg daily  -Medications previously tried: NA  -Recommended to continue current medication  Osteoarthritis (Goal: minimize arthritis pain) -Controlled -Current treatment  Acetaminophen CR 650 mg every 8 hours as needed Ibuprofen 200 mg 2 tablets every 6 hours as needed  -Counseled to avoid greater than 3000 mg of acetaminophen daily  -Recommended to continue current medication  Patient Goals/Self-Care Activities Over the next 90 days, patient will:  - check glucose daily (before breakfast), document, and provide at future appointments  Follow Up Plan: Telephone follow up appointment with care management team member scheduled for:  02/11/2022 at 2:15 PM      Patient agreed to services and verbal consent obtained.   The patient verbalized understanding of instructions, educational materials, and care plan provided today and DECLINED offer to receive copy of patient instructions, educational materials, and care plan.   Alexis Lloyd, PharmD, Alexis Lloyd, CPP  Clinical Pharmacist Practitioner  [facility] [phone]

## 2021-11-28 ENCOUNTER — Ambulatory Visit: Payer: Medicare Other | Admitting: Family Medicine

## 2021-11-29 ENCOUNTER — Ambulatory Visit (INDEPENDENT_AMBULATORY_CARE_PROVIDER_SITE_OTHER): Payer: Medicare Other | Admitting: Family Medicine

## 2021-11-29 ENCOUNTER — Encounter: Payer: Self-pay | Admitting: Family Medicine

## 2021-11-29 VITALS — BP 114/66 | HR 75 | Temp 98.4°F | Resp 16 | Ht 59.0 in | Wt 217.0 lb

## 2021-11-29 DIAGNOSIS — E1165 Type 2 diabetes mellitus with hyperglycemia: Secondary | ICD-10-CM

## 2021-11-29 DIAGNOSIS — M2062 Acquired deformities of toe(s), unspecified, left foot: Secondary | ICD-10-CM | POA: Insufficient documentation

## 2021-11-29 DIAGNOSIS — B3731 Acute candidiasis of vulva and vagina: Secondary | ICD-10-CM

## 2021-11-29 DIAGNOSIS — E785 Hyperlipidemia, unspecified: Secondary | ICD-10-CM | POA: Diagnosis not present

## 2021-11-29 DIAGNOSIS — N898 Other specified noninflammatory disorders of vagina: Secondary | ICD-10-CM | POA: Insufficient documentation

## 2021-11-29 DIAGNOSIS — E1169 Type 2 diabetes mellitus with other specified complication: Secondary | ICD-10-CM

## 2021-11-29 DIAGNOSIS — T753XXA Motion sickness, initial encounter: Secondary | ICD-10-CM | POA: Insufficient documentation

## 2021-11-29 LAB — POCT URINALYSIS DIPSTICK
Bilirubin, UA: NEGATIVE
Blood, UA: NEGATIVE
Glucose, UA: POSITIVE — AB
Ketones, UA: NEGATIVE
Leukocytes, UA: NEGATIVE
Nitrite, UA: NEGATIVE
Protein, UA: NEGATIVE
Spec Grav, UA: 1.015 (ref 1.010–1.025)
Urobilinogen, UA: 0.2 E.U./dL
pH, UA: 5.5 (ref 5.0–8.0)

## 2021-11-29 LAB — POCT GLYCOSYLATED HEMOGLOBIN (HGB A1C)
Est. average glucose Bld gHb Est-mCnc: 286
Hemoglobin A1C: 11.6 % — AB (ref 4.0–5.6)

## 2021-11-29 MED ORDER — FLUCONAZOLE 150 MG PO TABS: ORAL_TABLET | ORAL | 0 refills | Status: DC

## 2021-11-29 MED ORDER — SCOPOLAMINE 1 MG/3DAYS TD PT72: 1.0000 | MEDICATED_PATCH | TRANSDERMAL | 12 refills | Status: DC

## 2021-11-29 MED ORDER — PEN NEEDLES 32G X 6 MM MISC: 1.0000 | Freq: Every evening | 1 refills | Status: DC

## 2021-11-29 MED ORDER — INSULIN DEGLUDEC 100 UNIT/ML ~~LOC~~ SOPN: PEN_INJECTOR | SUBCUTANEOUS | 0 refills | Status: DC

## 2021-11-29 NOTE — Assessment & Plan Note (Signed)
Acute, stable Upcoming cruise for 8 days

## 2021-11-29 NOTE — Assessment & Plan Note (Signed)
Pain at base of L toe with hx of nephropathy as well Referral to podiatry for assistance with tendon release

## 2021-11-29 NOTE — Assessment & Plan Note (Signed)
Chronic, due for LP Last LDL at 95 Previously controlled on Crestor at 10 mg I recommend diet low in saturated fat and regular exercise - 30 min at least 5 times per week

## 2021-11-29 NOTE — Assessment & Plan Note (Signed)
Acute on chronic, has been working on drinking more water Negative UA, exception of glucose Continue diflucan; start once daily long acting insulin to assist at 10 units with self titration schedule

## 2021-11-29 NOTE — Assessment & Plan Note (Signed)
Denies STI concern UA negative Hold off on culture; will continue to treat for vaginal yeast s/e from [place]

## 2021-11-29 NOTE — Assessment & Plan Note (Signed)
Chronic, uncontrolled with hx of hyperglycemia and new insulin start  Start once daily insulin ** order written 6/23 A1c 11.6% Due for eye exam 9/23 Lipids repeated- on statin On ace Body mass index is 43.83 kg/m. BP at goal

## 2021-11-29 NOTE — Patient Instructions (Signed)
The CDC recommends two doses of Shingrix (the new shingles vaccine) separated by 2 to 6 months for adults age 63 years and older. I recommend checking with your insurance plan regarding coverage for this vaccine.    

## 2021-12-01 LAB — MICROALBUMIN / CREATININE URINE RATIO
Creatinine, Urine: 44 mg/dL
Microalb/Creat Ratio: <7 mg/g creat (ref 0–29)
Microalbumin, Urine: <3 ug/mL

## 2021-12-01 LAB — LIPID PANEL
Chol/HDL Ratio: 5.8 ratio — ABNORMAL HIGH (ref 0.0–4.4)
Cholesterol, Total: 146 mg/dL (ref 100–199)
HDL: 25 mg/dL — ABNORMAL LOW (ref >39)
LDL Chol Calc (NIH): 85 mg/dL (ref 0–99)
Triglycerides: 212 mg/dL — ABNORMAL HIGH (ref 0–149)
VLDL Cholesterol Cal: 36 mg/dL (ref 5–40)

## 2021-12-02 ENCOUNTER — Telehealth: Payer: Self-pay

## 2021-12-02 ENCOUNTER — Other Ambulatory Visit: Payer: Self-pay | Admitting: Family Medicine

## 2021-12-02 DIAGNOSIS — E1165 Type 2 diabetes mellitus with hyperglycemia: Secondary | ICD-10-CM

## 2021-12-02 MED ORDER — INSULIN DEGLUDEC 100 UNIT/ML ~~LOC~~ SOPN: PEN_INJECTOR | SUBCUTANEOUS | 0 refills | Status: DC

## 2021-12-02 NOTE — Telephone Encounter (Signed)
Copied from [place] [phone]. Topic: General - Other >> Dec 02, 2021 10:00 AM [name] wrote: Reason for CRM: The patient has called to follow up on their request for a permanent handicap placard   The patient has called for an update on their previously discussed request   Please contact further when possible

## 2021-12-02 NOTE — Addendum Note (Signed)
Addended by: Smitty Knudsen on: 12/02/2021 11:22 AM   Modules accepted: Orders

## 2021-12-02 NOTE — Progress Notes (Signed)
Spoke with patient, she states her understanding.  

## 2021-12-02 NOTE — Telephone Encounter (Signed)
Form is filled out and ready for pick-up, patient is aware.

## 2021-12-02 NOTE — Telephone Encounter (Signed)
Referral has been placed. Patient notified.

## 2021-12-02 NOTE — Telephone Encounter (Signed)
Copied from [place] [phone]. Topic: Referral - Request for Referral >> Dec 02, 2021  9:58 AM [name] wrote: Has patient seen PCP for this complaint? Yes.   *If NO, is insurance requiring patient see PCP for this issue before PCP can refer them? Referral for which specialty: Orthopedic Specialist  Preferred provider/office: Patient would like to be seen at [facility]  Reason for referral: left big toe concern

## 2021-12-02 NOTE — Telephone Encounter (Signed)
Copied from [place] [id number]. Topic: General - Other >> Dec 02, 2021 10:02 AM Alexis Lloyd wrote: Reason for CRM: The patient would like to be contacted by a member of clinical staff to review the directions for their newly prescribed insulin degludec (TRESIBA) 100 UNIT/ML FlexTouch Pen [[id number]]   Please contact when possible

## 2021-12-06 ENCOUNTER — Telehealth: Payer: Self-pay

## 2021-12-06 ENCOUNTER — Other Ambulatory Visit: Payer: Self-pay | Admitting: Family Medicine

## 2021-12-06 DIAGNOSIS — E1165 Type 2 diabetes mellitus with hyperglycemia: Secondary | ICD-10-CM

## 2021-12-06 DIAGNOSIS — K591 Functional diarrhea: Secondary | ICD-10-CM

## 2021-12-06 DIAGNOSIS — I10 Essential (primary) hypertension: Secondary | ICD-10-CM

## 2021-12-06 MED ORDER — LOPERAMIDE HCL 2 MG PO CAPS: 4.0000 mg | ORAL_CAPSULE | Freq: Four times a day (QID) | ORAL | 0 refills | Status: DC | PRN

## 2021-12-06 NOTE — Progress Notes (Signed)
Chronic Care Management Pharmacy Assistant   Name: Alexis Lloyd  MRN: [id number] DOB: 1960-07-26  Reason for Encounter: Diabetes Disease State Call.  Recent office visits:  11/29/2021 Alexis Joe FNP (PCP) start Tyler Aas start with 10 units at night and  adjust 2 units every 3 days, start transderm - Scop patch, Return in about 3 months   Recent consult visits:  No recent consult visit  Hospital visits:  None in previous 6 months  Medications: Outpatient Encounter Medications as of 12/06/2021  Medication Sig   albuterol (VENTOLIN HFA) 108 (90 Base) MCG/ACT inhaler INHALE 2 PUFFS BY MOUTH EVERY 6 HOURS AS NEEDED   ascorbic Acid (VITAMIN C) 500 MG CPCR Take 500 mg by mouth daily. (Patient not taking: Reported on 11/29/2021)   aspirin EC 81 MG tablet Take 1 tablet (81 mg total) by mouth daily. Swallow whole.   Blood Glucose Monitoring Suppl (ONETOUCH VERIO) w/Device KIT To check blood sugar once daily   cetirizine (ZYRTEC) 10 MG tablet Take 1 tablet (10 mg total) by mouth daily.   Cholecalciferol (VITAMIN D3) 50 MCG (2000 UT) TABS Take 2,000 Units by mouth. (Patient not taking: Reported on 11/29/2021)   conjugated estrogens (PREMARIN) vaginal cream Place 1 Applicatorful vaginally daily.   Cyanocobalamin (VITAMIN B-12) 3000 MCG SUBL Place 1 tablet under the tongue daily at 6 (six) AM. Gummies   dapagliflozin propanediol (FARXIGA) 10 MG TABS tablet Take 1 tablet (10 mg total) by mouth daily before breakfast. Patient receives via [employer] patient assistance   estradiol (ESTRACE VAGINAL) 0.1 MG/GM vaginal cream Place 1 Applicatorful vaginally at bedtime.   fluconazole (DIFLUCAN) 150 MG tablet Take for vaginal yeast, repeat dose in 4 days is symptoms remain.   glipiZIDE (GLUCOTROL) 10 MG tablet TAKE 1 TABLET BY MOUTH  TWICE DAILY BEFORE A MEAL   glucose blood (ONETOUCH VERIO) test strip To check blood sugar once daily   ibuprofen (ADVIL) 200 MG tablet Take 400 mg by mouth every 6 (six) hours  as needed.   insulin degludec (TRESIBA) 100 UNIT/ML FlexTouch Pen Start with 10 units at night. Adjust dose by 2 units every 3 days, no sooner, to reach fasting blood sugar goal of <150.   Insulin Pen Needle (PEN NEEDLES) 32G X 6 MM MISC 1 each by Does not apply route at bedtime.   Lancets (ONETOUCH ULTRASOFT) lancets To check BS once daily   lisinopril-hydrochlorothiazide (ZESTORETIC) 20-12.5 MG tablet Take 2 tablets by mouth once daily   montelukast (SINGULAIR) 10 MG tablet Take 1 tablet by mouth once daily   pantoprazole (PROTONIX) 40 MG tablet Take 1 tablet by mouth once daily   rosuvastatin (CRESTOR) 10 MG tablet Take 1 tablet by mouth once daily   scopolamine (TRANSDERM-SCOP) 1 MG/3DAYS Place 1 patch (1.5 mg total) onto the skin every 3 (three) days.   spironolactone (ALDACTONE) 25 MG tablet Take 1 tablet (25 mg total) by mouth daily.   SUMAtriptan (IMITREX) 50 MG tablet Take 1 tablet (50 mg total) by mouth every 2 (two) hours as needed for migraine. No more than 266m in 24 hours   zinc gluconate 50 MG tablet Take 50 mg by mouth daily.   No facility-administered encounter medications on file as of 12/06/2021.    Care Gaps: Shingrix Vaccine A1C- 11.6% on 11/29/2021   Star Rating Drugs: Lisinopril-HCTZ 20-12.5 mg [facility] Glipizide 10 mg [facility]. Rosuvastatin 10 mg last filled on 11/12/2021 for 90 day supply at [facility]. Farxiga 10 mg last filled 10/01/2021 for 30 day supply at [facility] (receives from [facility]).    Medication fill Gaps: None  Recent Relevant Labs: Lab Results  Component Value Date/Time   HGBA1C 11.6 (A) 11/29/2021 10:48 AM   HGBA1C 11.7 (H) 08/30/2021 10:49 AM   HGBA1C 9.1 (H) 05/17/2021 09:38 AM   MICROALBUR 50 08/31/2017 10:06 AM   MICROALBUR 50 03/05/2016 02:04 PM    Kidney Function Lab Results  Component Value Date/Time    CREATININE 0.73 05/17/2021 09:38 AM   CREATININE 0.93 08/01/2020 11:27 AM   CREATININE 0.75 04/14/2014 09:25 AM   CREATININE 0.71 11/02/2013 10:07 AM   GFRNONAA 67 08/01/2020 11:27 AM   GFRNONAA >60 06/15/2020 02:48 AM   GFRNONAA >60 04/14/2014 09:25 AM   GFRNONAA >60 11/02/2013 10:07 AM   GFRAA 77 08/01/2020 11:27 AM   GFRAA >60 04/14/2014 09:25 AM   GFRAA >60 11/02/2013 10:07 AM    Current antihyperglycemic regimen:  Farxiga 10 mg daily  Glipizide 10 mg twice daily Tresiba 10 units nightly  Have there been any recent hospitalizations or ED visits since last visit with CPP? No  Patient denies hypoglycemic symptoms, including Pale, Sweaty, Shaky, Hungry, Nervous/irritable, and Vision changes  Patient reports hyperglycemic symptoms, including weakness  How often are you checking your blood sugar? once daily  What are your blood sugars ranging?  Bedtime: 300,275,235  During the week, how often does your blood glucose drop below 70? Never   Patient states she not doing good  with the [place] which she started  on Tuesday night, and taken it on Wednesday night, and Thursday night. Patient confirms she is  taking 10 mg, and states she is having diarrhea which cause her to mess her bed up while she was sleeping. Patient reports  she can feel her bowels move while she is a sleep.Patient states her PCP want her to increase her Tyler Aas every three night, but unsure if she should continue taking it. Notified Clinical Pharmacist.  Per Clinical Pharmacist,Please reassure the patient that these symptoms will likely improve as her blood sugars stabilize. I spoke with Alexis Lloyd and we are going to send in an Rx for loperamide to help with her diarrhea symptoms. Please continue to increase the Tresiba by 2 units every 3 days as recommended so she can increase to 12 units tonight.   Patient verbalized understanding, and she is aware I will contact her in one week to see how she is  doing.  [place] Pharmacist Assistant [phone]

## 2021-12-11 ENCOUNTER — Ambulatory Visit (INDEPENDENT_AMBULATORY_CARE_PROVIDER_SITE_OTHER): Payer: Medicare Other | Admitting: Family Medicine

## 2021-12-11 ENCOUNTER — Ambulatory Visit: Payer: Self-pay | Admitting: *Deleted

## 2021-12-11 ENCOUNTER — Encounter: Payer: Self-pay | Admitting: Family Medicine

## 2021-12-11 VITALS — BP 126/83 | HR 77 | Temp 97.8°F | Resp 16 | Wt 215.6 lb

## 2021-12-11 DIAGNOSIS — Z794 Long term (current) use of insulin: Secondary | ICD-10-CM | POA: Diagnosis not present

## 2021-12-11 DIAGNOSIS — E1142 Type 2 diabetes mellitus with diabetic polyneuropathy: Secondary | ICD-10-CM

## 2021-12-11 DIAGNOSIS — E1165 Type 2 diabetes mellitus with hyperglycemia: Secondary | ICD-10-CM | POA: Diagnosis not present

## 2021-12-11 DIAGNOSIS — J4520 Mild intermittent asthma, uncomplicated: Secondary | ICD-10-CM | POA: Diagnosis not present

## 2021-12-11 MED ORDER — FREESTYLE LIBRE 14 DAY READER DEVI: 0 refills | Status: DC

## 2021-12-11 MED ORDER — ALBUTEROL SULFATE HFA 108 (90 BASE) MCG/ACT IN AERS: 2.0000 | INHALATION_SPRAY | Freq: Four times a day (QID) | RESPIRATORY_TRACT | 0 refills | Status: DC | PRN

## 2021-12-11 MED ORDER — ONETOUCH ULTRASOFT LANCETS MISC: 12 refills | Status: DC

## 2021-12-11 MED ORDER — ONETOUCH VERIO VI STRP: ORAL_STRIP | 3 refills | Status: DC

## 2021-12-11 NOTE — Patient Instructions (Signed)
It was great to see you!  Our plans for today:  - Increase your tresiba to 14 units. Continue to increase by 2 units every 3 days.  - We refilled your albuterol.  - Try mucinex sinus max (decongestant, expectorant).  We are checking some labs today, we will release these results to your MyChart.  Take care and seek immediate care sooner if you develop any concerns.   Dr. Ky Barban

## 2021-12-11 NOTE — Telephone Encounter (Signed)
  Chief Complaint: elevated glucose Symptoms: elevated glucose, headache, hoarseness, breathy at times Frequency: recent changes in medications Pertinent Negatives: Patient denies   Disposition: '[]'$ ED /'[]'$ Urgent Care (no appt availability in office) / '[x]'$ Appointment(In office/virtual)/ '[]'$  [place] [facility]/ '[]'$ Home Care/ '[]'$ Refused Recommended Disposition /'[]'$ [place] [facility]/ '[]'$  Follow-up with PCP Additional Notes: Disposition is to call office- appointment given by [place]      Reason for Disposition  [1] Blood glucose > 300 mg/dL (16.7 mmol/L) AND [2] two or more times in a row  Answer Assessment - Initial Assessment Questions 1. BLOOD GLUCOSE: "What is your blood glucose level?"      458, 308- 11:21 2. ONSET: "When did you check the blood glucose?"     Now- 11:00 3. USUAL RANGE: "What is your glucose level usually?" (e.g., usual fasting morning value, usual evening value)     Fasting- over 200 , after meal- 458- 9:00 4. KETONES: "Do you check for ketones (urine or blood test strips)?" If yes, ask: "What does the test show now?"        5. TYPE 1 or 2:  "Do you know what type of diabetes you have?"  (e.g., Type 1, Type 2, Gestational; doesn't know)      Type 2 6. INSULIN: "Do you take insulin?" "What type of insulin(s) do you use? What is the mode of delivery? (syringe, pen; injection or pump)?"      Insulin at night- 12 units 7. DIABETES PILLS: "Do you take any pills for your diabetes?" If yes, ask: "Have you missed taking any pills recently?"     Glipizide, Farxiga 8. OTHER SYMPTOMS: "Do you have any symptoms?" (e.g., fever, frequent urination, difficulty breathing, dizziness, weakness, vomiting)     Headache, hoarseness- breathy 9. PREGNANCY: "Is there any chance you are pregnant?" "When was your last menstrual period?"  Protocols used: Diabetes - High Blood Sugar-A-AH

## 2021-12-11 NOTE — Assessment & Plan Note (Signed)
Uncontrolled. Continue to titrate tresiba every 3 days. Will increase to 14u today. Previously intolerant to GLP-1, metformin. Continue glipizide, farxiga. Has upcoming Endo appt next month.

## 2021-12-11 NOTE — Progress Notes (Signed)
   I,Jana Robinson,acting as a Education administrator for Myles Gip, DO.,have documented all relevant documentation on the behalf of Myles Gip, DO,as directed by  Myles Gip, DO while in the presence of Myles Gip, DO.   SUBJECTIVE:   CHIEF COMPLAINT / HPI:  Patient presents for elevated glucose, headache/facial, hoarseness, "breathy at times." Reports glucose reading this morning was 458.  Patient called office and instructed to take again and reading was 308. Second reading was after breakfast.  Patient is concerned her meds should be adjusted.    Diabetes, Type 2 - last seen 06/23 for uncontrolled diabetes, new insulin start. Instructed to start at 10u and increase by 2 units every 3 days for CBGs >150. - Last A1c 11.6 11/2021 - Medications: glipizide, farxiga, tresiba 12u. Previously on trulicity, metformin.  - Compliance: good - Checking BG at home: yes, 236 this am fasting.  - Eye exam: ordered - Foot exam: referred to podiatry - Microalbumin: on ACEi/ARB - Statin: yes - PNA vaccine: yes - Denies symptoms of hypoglycemia, polyuria, polydipsia, numbness extremities, foot ulcers/trauma  UPPER RESPIRATORY TRACT INFECTION Duration: 2 days Fever: no Cough: yes Shortness of breath: yes Wheezing: no Chest pain: no Chest tightness: yes Chest congestion: yes Nasal congestion: yes Runny nose: yes Sore throat: yes Sinus pressure: yes Headache: yes Relief with OTC cold/cough medications: hasn't tried  Treatments attempted: tylenol    OBJECTIVE:   BP 126/83 (BP Location: Right Arm, Patient Position: Sitting, Cuff Size: Large)   Pulse 77   Temp 97.8 F (36.6 C) (Oral)   Resp 16   Wt 215 lb 9.6 oz (97.8 kg)   SpO2 98%   BMI 43.55 kg/m   Gen: well appearing, in NAD Card: RRR Lungs: CTAB Ext: WWP, no edema   ASSESSMENT/PLAN:   Uncontrolled type 2 diabetes mellitus with hyperglycemia, with long-term current use of insulin (HCC) Uncontrolled. Continue to  titrate tresiba every 3 days. Will increase to 14u today. Previously intolerant to GLP-1, metformin. Continue glipizide, farxiga. Has upcoming Endo appt next month.   VIRAL URI Possible cortisol response contributing to higher CBGs as well. Lungs clear on exam, no evidence of bacterial infection, PNA.  Reviewed OTC symptom relief and return precautions. Albuterol refilled.    Myles Gip, DO

## 2021-12-12 LAB — BASIC METABOLIC PANEL
BUN/Creatinine Ratio: 23 (ref 12–28)
BUN: 23 mg/dL (ref 8–27)
CO2: 22 mmol/L (ref 20–29)
Calcium: 10.2 mg/dL (ref 8.7–10.3)
Chloride: 98 mmol/L (ref 96–106)
Creatinine, Ser: 1.01 mg/dL — ABNORMAL HIGH (ref 0.57–1.00)
Glucose: 174 mg/dL — ABNORMAL HIGH (ref 70–99)
Potassium: 4.2 mmol/L (ref 3.5–5.2)
Sodium: 137 mmol/L (ref 134–144)
eGFR: 63 mL/min/{1.73_m2} (ref >59)

## 2021-12-16 ENCOUNTER — Ambulatory Visit: Payer: Medicare Other | Admitting: *Deleted

## 2021-12-16 ENCOUNTER — Telehealth: Payer: Self-pay

## 2021-12-16 DIAGNOSIS — Z86000 Personal history of in-situ neoplasm of breast: Secondary | ICD-10-CM | POA: Diagnosis not present

## 2021-12-16 DIAGNOSIS — D0512 Intraductal carcinoma in situ of left breast: Secondary | ICD-10-CM | POA: Diagnosis not present

## 2021-12-16 LAB — HM MAMMOGRAPHY

## 2021-12-16 NOTE — Progress Notes (Signed)
    Chronic Care Management Pharmacy Assistant   Name: Alexis Lloyd  MRN: [id number] DOB: 03/05/1961  Recent Relevant Labs: Lab Results  Component Value Date/Time   HGBA1C 11.6 (A) 11/29/2021 10:48 AM   HGBA1C 11.7 (H) 08/30/2021 10:49 AM   HGBA1C 9.1 (H) 05/17/2021 09:38 AM   MICROALBUR 50 08/31/2017 10:06 AM   MICROALBUR 50 03/05/2016 02:04 PM    Kidney Function Lab Results  Component Value Date/Time   CREATININE 1.01 (H) 12/11/2021 01:50 PM   CREATININE 0.73 05/17/2021 09:38 AM   CREATININE 0.75 04/14/2014 09:25 AM   CREATININE 0.71 11/02/2013 10:07 AM   GFRNONAA 67 08/01/2020 11:27 AM   GFRNONAA >60 06/15/2020 02:48 AM   GFRNONAA >60 04/14/2014 09:25 AM   GFRNONAA >60 11/02/2013 10:07 AM   GFRAA 77 08/01/2020 11:27 AM   GFRAA >60 04/14/2014 09:25 AM   GFRAA >60 11/02/2013 10:07 AM    Current antihyperglycemic regimen:  Farxiga 10 mg daily  Glipizide 10 mg twice daily Tresiba 14 units nightly  What recent interventions/DTPs have been made to improve glycemic control:  12/11/2021 Increase Tresiba to 14 unit daily Continue to increase by 2 units every 3 days.   Have there been any recent hospitalizations or ED visits since last visit with CPP? No  Patient denies hypoglycemic symptoms, including Pale, Sweaty, Shaky, Hungry, Nervous/irritable, and Vision changes  Patient denies hyperglycemic symptoms, including blurry vision, excessive thirst, fatigue, polyuria, and weakness  How often are you checking your blood sugar? once daily  What are your blood sugars ranging?  Fasting:  Patient reports her blood sugar is ranging around 200's when he first gets up. [phone]  After meals:  Patient reports after she takes her medications her blood sugar ranging around  the 100's.160,140.  Patient reports her upset stomach she was having has improved, and states she is feeling better.   [place] Pharmacist Assistant [phone]

## 2021-12-18 ENCOUNTER — Ambulatory Visit: Payer: Medicare Other | Admitting: Podiatry

## 2021-12-26 ENCOUNTER — Encounter: Payer: Medicare Other | Attending: Family Medicine | Admitting: *Deleted

## 2021-12-26 ENCOUNTER — Encounter: Payer: Self-pay | Admitting: *Deleted

## 2021-12-26 VITALS — BP 124/72 | Ht 59.0 in | Wt 215.6 lb

## 2021-12-26 DIAGNOSIS — E1165 Type 2 diabetes mellitus with hyperglycemia: Secondary | ICD-10-CM | POA: Insufficient documentation

## 2021-12-26 DIAGNOSIS — Z713 Dietary counseling and surveillance: Secondary | ICD-10-CM | POA: Insufficient documentation

## 2021-12-26 DIAGNOSIS — E785 Hyperlipidemia, unspecified: Secondary | ICD-10-CM | POA: Diagnosis not present

## 2021-12-26 NOTE — Progress Notes (Signed)
Diabetes Self-Management Education  Visit Type: First/Initial  Appt. Start Time: 1315 Appt. End Time: 1430  12/26/2021  Alexis Lloyd, identified by name and date of birth, is a 61 y.o. female with a diagnosis of Diabetes: Type 2.   ASSESSMENT  Blood pressure 124/72, height '4\' 11"'$  (1.499 m), weight 215 lb 9.6 oz (97.8 kg). Body mass index is 43.55 kg/m.   Diabetes Self-Management Education - 12/26/21 1455       Visit Information   Visit Type First/Initial      Initial Visit   Diabetes Type Type 2    Date Diagnosed 5 years ago    Are you currently following a meal plan? No    Are you taking your medications as prescribed? Yes  Pt wasn't aware to increase by 2 units every 3 days per PCP but will start 16 units tonight.     Health Coping   How would you rate your overall health? Fair      Psychosocial Assessment   Patient Belief/Attitude about Diabetes Other (comment)   "ok"   What is the hardest part about your diabetes right now, causing you the most concern, or is the most worrisome to you about your diabetes?   Making healty food and beverage choices    Self-care barriers None    Self-management support Doctor's office;Family    Patient Concerns Nutrition/Meal planning;Glycemic Control;Medication    Special Needs None    Preferred Learning Style Auditory    Learning Readiness Ready    How often do you need to have someone help you when you read instructions, pamphlets, or other written materials from your doctor or pharmacy? 1 - Never      Pre-Education Assessment   Patient understands the diabetes disease and treatment process. Needs Review    Patient understands incorporating nutritional management into lifestyle. Needs Instruction    Patient undertands incorporating physical activity into lifestyle. Needs Instruction    Patient understands using medications safely. Needs Instruction    Patient understands monitoring blood glucose, interpreting and using results  Needs Review    Patient understands prevention, detection, and treatment of acute complications. Needs Review    Patient understands prevention, detection, and treatment of chronic complications. Needs Review    Patient understands how to develop strategies to address psychosocial issues. Needs Review    Patient understands how to develop strategies to promote health/change behavior. Needs Review      Complications   Last HgB A1C per patient/outside source 11.6 %   11/29/2021   How often do you check your blood sugar? 1-2 times/day    Fasting Blood glucose range (mg/dL) >200   She reports all fasting blood sugars greater than 200 mg/dL   Number of hypoglycemic episodes per month 0 Pt reports she has peppermints and glucose tablets at home.   Number of hyperglycemic episodes ( >'200mg'$ /dL): Daily    Can you tell when your blood sugar is high? Yes    What do you do if your blood sugar is high? headache    Have you had a dilated eye exam in the past 12 months? Yes    Have you had a dental exam in the past 12 months? Yes    Are you checking your feet? Yes    How many days per week are you checking your feet? 6      Dietary Intake   Breakfast eggs, bacon, cheese on bun; fruit (watermelon, pineapple, peaches)    Lunch sandwich  or chips    Dinner beef, pork; potatoes, beans, corn, occasional rice and pasta, green beans, broccoli, greens, cabbage, okra, salads    Snack (evening) chips, sweets    Beverage(s) water, Pepsi, diet soda      Activity / Exercise   Activity / Exercise Type Light (walking / raking leaves)    How many days per week do you exercise? 1    How many minutes per day do you exercise? 15    Total minutes per week of exercise 15      Patient Education   Previous Diabetes Education Yes (please comment)   here 2 years ago   Disease Pathophysiology Factors that contribute to the development of diabetes;Explored patient's options for treatment of their diabetes    Healthy Eating  Role of diet in the treatment of diabetes and the relationship between the three main macronutrients and blood glucose level;Food label reading, portion sizes and measuring food.;Reviewed blood glucose goals for pre and post meals and how to evaluate the patients' food intake on their blood glucose level.;Meal timing in regards to the patients' current diabetes medication.    Being Active Role of exercise on diabetes management, blood pressure control and cardiac health.    Medications Taught/reviewed insulin/injectables, injection, site rotation, insulin/injectables storage and needle disposal.;Reviewed patients medication for diabetes, action, purpose, timing of dose and side effects.    Monitoring Purpose and frequency of SMBG.;Taught/discussed recording of test results and interpretation of SMBG.;Identified appropriate SMBG and/or A1C goals.    Acute complications Taught prevention, symptoms, and  treatment of hypoglycemia - the 15 rule.    Chronic complications Relationship between chronic complications and blood glucose control    Diabetes Stress and Support Identified and addressed patients feelings and concerns about diabetes      Individualized Goals (developed by patient)   Reducing Risk Other (comment)   Improve blood sugars, decrease medications, become more fit     Outcomes   Expected Outcomes Demonstrated interest in learning. Expect positive outcomes    Future DMSE 2 months         Individualized Plan for Diabetes Self-Management Training:   Learning Objective:  Patient will have a greater understanding of diabetes self-management. Patient education plan is to attend individual and/or group sessions per assessed needs and concerns.   Plan:   Patient Instructions  Check blood sugars before breakfast every day and occasionally check 2 hrs after supper Bring blood sugar records to the next appointment  Exercise: Continue walking for    15  minutes   5  days a week and  gradually increase to 30 minutes 5 x week  Eat 3 meals day,   1-2  snacks a day Space meals 4-6 hours apart Avoid sugar sweetened drinks (soda) unless treating a low blood sugar  Complete 3 Day Food Record and bring to next appt  Carry fast acting glucose and a snack at all times Rotate injection sites Hold insulin pen in place for 5-10 seconds after injection  Return for appointment on:   Monday Sept 18, 2023 at 1:30 pm with Freda Munro (nurse)  Expected Outcomes:  Demonstrated interest in learning. Expect positive outcomes  Education material provided:  General Meal Planning Guidelines Simple Meal Plan 3 Day Food Record Symptoms, causes and treatments of Hypoglycemia   If problems or questions, patient to contact team via:   Johny Drilling, RN, [place] [phone]  Future DSME appointment: 2 months February 24, 2022 with this educator

## 2021-12-26 NOTE — Patient Instructions (Addendum)
Check blood sugars before breakfast every day and occasionally check 2 hrs after supper Bring blood sugar records to the next appointment  Exercise: Continue walking for    15  minutes   5  days a week and gradually increase to 30 minutes 5 x week  Eat 3 meals day,   1-2  snacks a day Space meals 4-6 hours apart Avoid sugar sweetened drinks (soda) unless treating a low blood sugar  Complete 3 Day Food Record and bring to next appt  Carry fast acting glucose and a snack at all times Rotate injection sites Hold insulin pen in place for 5-10 seconds after injection  Return for appointment on:   Monday Sept 18, 2023 at 1:30 pm with Freda Munro (nurse)

## 2021-12-31 ENCOUNTER — Other Ambulatory Visit: Payer: Self-pay | Admitting: Family Medicine

## 2021-12-31 DIAGNOSIS — E1165 Type 2 diabetes mellitus with hyperglycemia: Secondary | ICD-10-CM

## 2022-01-01 ENCOUNTER — Other Ambulatory Visit: Payer: Self-pay | Admitting: Family Medicine

## 2022-01-01 DIAGNOSIS — I10 Essential (primary) hypertension: Secondary | ICD-10-CM

## 2022-01-02 DIAGNOSIS — M1712 Unilateral primary osteoarthritis, left knee: Secondary | ICD-10-CM | POA: Diagnosis not present

## 2022-01-03 ENCOUNTER — Telehealth: Payer: Self-pay

## 2022-01-03 NOTE — Telephone Encounter (Signed)
Copied from [name] [id number]. Topic: General - Other >> Jan 03, 2022  8:47 AM [name] wrote: Reason for CRM: The patient has called requesting to speak with Alexis Lloyd when possible  The patient is interested in learning more information about the medication assistance program   Please contact further when possible

## 2022-01-03 NOTE — Progress Notes (Signed)
    Chronic Care Management Pharmacy Assistant   Name: GWENDELYN LANTING  MRN: [id number] DOB: Apr 21, 1961   Returning patient call as requested by Clinical pharmacist.Patient states she went to get her Tyler Aas filled, and she was inform it would cost her $105.00 which she is unable to afford that. Notfied Clinical pharmacist, and ask if her PCP had any samples of Tresiba.  Per Clinical pharmacist, Yes we can start on PAP for her, would she be able to get a one month supply of her insulin? That would be $35   Patient states she needs longer then a 30 day supply as she is going to be on vacation, and trying to get her medication before hand.  Per Clinical pharmacist, We do have a sample pen we can give her But I would still see about picking up more from her pharmacy if she will be away for greater than 30 days. She should also check what her cruise's policy is for storing insulin on a trip.   Patient verbalized understanding, and states is her Tyler Aas suppose to be store in the refrigerator because her diabetic clinic inform her that once she use the first dose that she does not need to put it in the refrigerator.Patient reports she has not  been putting her Tyler Aas  in the refrigerator after she use the first dose.    Per Clinical pharmacist, Tyler Aas can be stored out of the fridge for at most 8 weeks. Unopened pens should be kept in the fridge.Patient samples will be place at the front desk of her PCP office.  Patient verbalized understanding.  Patient agreed to start patient assistance application for [place].  Spoke with the patient and she  requested that the application mailed.I informed  her once she receives the application she will need to complete her part of the application and return it to her PCP office for [name], CPP to fax over to [employer] for processing.Informed patient to include a copy of her proof of income AND a copy of her Explanation of Benefits (EOB) statement  from her insurance. The application will need to be faxed to [phone] and the phone number that can be called to check the status of the application will be [phone]. Patient verbalized understanding and was provided my phone number of [phone] if she has any questions.   Application emailed to [name], CPP for review and to be mail to patient home.   [place] Pharmacist Assistant [phone]

## 2022-01-06 ENCOUNTER — Ambulatory Visit: Payer: Medicare Other | Admitting: Dermatology

## 2022-01-20 ENCOUNTER — Telehealth: Payer: Self-pay

## 2022-01-20 NOTE — Progress Notes (Signed)
Per Clinical pharmacist, Please reschedule patient appointment on 02/11/2022 to either a different time or a different day. Also let her know I received her PAP paperwork for [place]. the only financial information I have for her is from 2021, and they will need a more current statement from social security.  Telephone follow up appointment with Care management team member was rescheduled for : 02/25/2022 at 11:00 am.  Patient states she has to reach out to social security and request her income which can take up to a week or so.  [place] Pharmacist Assistant [phone]

## 2022-01-21 DIAGNOSIS — M2022 Hallux rigidus, left foot: Secondary | ICD-10-CM | POA: Diagnosis not present

## 2022-01-21 DIAGNOSIS — E119 Type 2 diabetes mellitus without complications: Secondary | ICD-10-CM | POA: Diagnosis not present

## 2022-01-21 DIAGNOSIS — L600 Ingrowing nail: Secondary | ICD-10-CM | POA: Diagnosis not present

## 2022-01-21 DIAGNOSIS — B351 Tinea unguium: Secondary | ICD-10-CM | POA: Diagnosis not present

## 2022-02-06 DIAGNOSIS — E1169 Type 2 diabetes mellitus with other specified complication: Secondary | ICD-10-CM | POA: Diagnosis not present

## 2022-02-06 DIAGNOSIS — E785 Hyperlipidemia, unspecified: Secondary | ICD-10-CM | POA: Diagnosis not present

## 2022-02-06 DIAGNOSIS — I152 Hypertension secondary to endocrine disorders: Secondary | ICD-10-CM | POA: Diagnosis not present

## 2022-02-06 DIAGNOSIS — E1165 Type 2 diabetes mellitus with hyperglycemia: Secondary | ICD-10-CM | POA: Diagnosis not present

## 2022-02-06 DIAGNOSIS — E1159 Type 2 diabetes mellitus with other circulatory complications: Secondary | ICD-10-CM | POA: Diagnosis not present

## 2022-02-11 ENCOUNTER — Telehealth: Payer: Medicare Other

## 2022-02-19 ENCOUNTER — Other Ambulatory Visit: Payer: Self-pay | Admitting: Family Medicine

## 2022-02-19 ENCOUNTER — Telehealth: Payer: Self-pay

## 2022-02-19 DIAGNOSIS — I10 Essential (primary) hypertension: Secondary | ICD-10-CM

## 2022-02-19 DIAGNOSIS — J40 Bronchitis, not specified as acute or chronic: Secondary | ICD-10-CM

## 2022-02-19 NOTE — Telephone Encounter (Signed)
insulin degludec (TRESIBA) 100 UNIT/ML FlexTouch Pen  This pt states that she was given a sample by Cristie Hem and it is almost out and she has received her disability paper and should be getting approval within nxt 7 days and ins will pay. Could she have one more sample, cannot afford fu [phone]

## 2022-02-24 ENCOUNTER — Ambulatory Visit: Payer: Medicare Other | Admitting: *Deleted

## 2022-02-24 ENCOUNTER — Telehealth: Payer: Self-pay

## 2022-02-24 NOTE — Telephone Encounter (Signed)
Copied from [place] [id number]. Topic: General - Inquiry >> Feb 24, 2022 10:10 AM Erskine Squibb wrote: Reason for CRM: The patient called in requesting samples of Tyler Aas because she is on patient assistance abd they won't refill her medicine until she finishes her application and verifies her income and she said that will take 7-10 days. She said she has talked to [facility] before and he has helped. Please assist patient further.

## 2022-02-24 NOTE — Telephone Encounter (Signed)
Called patient to let her know a medication sample is ready for her to pick up. Okay for PEC to advise.

## 2022-02-24 NOTE — Telephone Encounter (Signed)
Please let the patient know a sample pen of Tyler Aas is available for her to pick up.  Thanks, [name], PharmD, Para March, [place] Pharmacist Practitioner  [facility] [phone]

## 2022-02-25 ENCOUNTER — Ambulatory Visit (INDEPENDENT_AMBULATORY_CARE_PROVIDER_SITE_OTHER): Payer: Medicare Other

## 2022-02-25 DIAGNOSIS — E1165 Type 2 diabetes mellitus with hyperglycemia: Secondary | ICD-10-CM

## 2022-02-25 DIAGNOSIS — E1169 Type 2 diabetes mellitus with other specified complication: Secondary | ICD-10-CM

## 2022-02-25 NOTE — Progress Notes (Signed)
Chronic Care Management Pharmacy Note  03/07/2022 Name:  TRENITY PHA MRN:  [id number] DOB:  06-Sep-1960  Summary: Patient presents for CCM follow-up. Tresiba PAP still in progress, urged patient to bring in proof of income as soon as possible. Will need to co-ordinate with Dr. Honor Junes regarding retrial of Ozempic or Rybelsus.   Recommendations/Changes made from today's visit: Continue current medications  Plan: CPP follow-up 1 month   Subjective: Willis Kuipers Crilly is an 61 y.o. year old female who is a primary patient of Gwyneth Sprout, [place].  The CCM team was consulted for assistance with disease management and care coordination needs.    Engaged with patient by telephone for follow up visit in response to provider referral for pharmacy case management and/or care coordination services.   Consent to Services:  The patient was given information about Chronic Care Management services, agreed to services, and gave verbal consent prior to initiation of services.  Please see initial visit note for detailed documentation.   Patient Care Team: Gwyneth Sprout, FNP as PCP - General (Family Medicine) Imagene Riches, CNM as Midwife (Obstetrics) Germaine Pomfret, [facility] (Pharmacist) Lonia Farber, MD as Consulting Physician (Endocrinology)  Recent office visits: 10/10/21: Patient presented to [name], FNP for cystitis.  08/30/21: Patient presented to [name], FNP for urinary frequency.   Recent consult visits: 10/14/21: Patient presented to Dr. Matilde Sprang (Urology) for bladder infection.  09/05/21: Patient presented to Dr. Honor Junes (Endocrinology) for follow-up.   Hospital visits: 1/2-06/15/20: Patient hospitalized for Covid infection. Patient treated with IV decadron, Zofran, and Remdesivir.   Objective:  Lab Results  Component Value Date   CREATININE 1.01 (H) 12/11/2021   BUN 23 12/11/2021   GFRNONAA 67 08/01/2020   GFRAA 77 08/01/2020   NA 137 12/11/2021   K 4.2  12/11/2021   CALCIUM 10.2 12/11/2021   CO2 22 12/11/2021    Lab Results  Component Value Date/Time   HGBA1C 11.6 (A) 11/29/2021 10:48 AM   HGBA1C 11.7 (H) 08/30/2021 10:49 AM   HGBA1C 9.1 (H) 05/17/2021 09:38 AM   MICROALBUR 50 08/31/2017 10:06 AM   MICROALBUR 50 03/05/2016 02:04 PM    Last diabetic Eye exam:  Lab Results  Component Value Date/Time   HMDIABEYEEXA No Retinopathy 03/05/2022 12:00 AM    Last diabetic Foot exam: No results found for: "HMDIABFOOTEX"   Lab Results  Component Value Date   CHOL 146 11/29/2021   HDL 25 (L) 11/29/2021   LDLCALC 85 11/29/2021   TRIG 212 (H) 11/29/2021   CHOLHDL 5.8 (H) 11/29/2021       Latest Ref Rng & Units 05/17/2021    9:38 AM 08/01/2020   11:27 AM 06/15/2020    2:48 AM  Hepatic Function  Total Protein 6.0 - 8.5 g/dL 7.0  7.1  6.4   Albumin 3.8 - 4.8 g/dL 4.3  4.3  2.8   AST 0 - 40 IU/L _0 ALT 0 - 32 IU/L _1 Alk Phosphatase 44 - 121 IU/L 103  64  45   Total Bilirubin 0.0 - 1.2 mg/dL 0.2  0.3  0.6     Lab Results  Component Value Date/Time   TSH 2.190 09/16/2018 11:12 AM   TSH 2.510 08/31/2017 10:50 AM       Latest Ref Rng & Units 05/17/2021    9:38 AM 08/01/2020   11:27 AM 06/10/2020    5:51 PM  CBC  WBC 3.4 - 10.8 x10E3/uL 7.2  6.5  10.7   Hemoglobin 11.1 - 15.9 g/dL 11.6  11.0  11.2   Hematocrit 34.0 - 46.6 % 34.6  33.2  33.9   Platelets 150 - 450 x10E3/uL 315  243  308     No results found for: "VD25OH"  Clinical ASCVD: No  The 10-year ASCVD risk score (Arnett DK, et al., 2019) is: 17.2%   Values used to calculate the score:     Age: 61 years     Sex: Female     Is Non-Hispanic African American: Yes     Diabetic: Yes     Tobacco smoker: No     Systolic Blood Pressure: 867 mmHg     Is BP treated: Yes     HDL Cholesterol: 25 mg/dL     Total Cholesterol: 146 mg/dL       03/03/2022    4:03 PM 12/26/2021    1:26 PM 05/17/2021    8:49 AM  Depression screen PHQ 2/9  Decreased Interest 0 0 0   Down, Depressed, Hopeless 0 0 0  PHQ - 2 Score 0 0 0  Altered sleeping   0  Tired, decreased energy   0  Change in appetite   0  Feeling bad or failure about yourself    0  Trouble concentrating   0  Moving slowly or fidgety/restless   0  Suicidal thoughts   0  PHQ-9 Score   0  Difficult doing work/chores   Not difficult at all     Social History   Tobacco Use  Smoking Status Former   Packs/day: 0.25   Years: 21.00   Total pack years: 5.25   Types: Cigarettes   Quit date: 06/08/1997   Years since quitting: 24.7  Smokeless Tobacco Never   BP Readings from Last 3 Encounters:  12/26/21 124/72  12/11/21 126/83  11/29/21 114/66   Pulse Readings from Last 3 Encounters:  12/11/21 77  11/29/21 75  10/14/21 63   Wt Readings from Last 3 Encounters:  12/26/21 215 lb 9.6 oz (97.8 kg)  12/11/21 215 lb 9.6 oz (97.8 kg)  11/29/21 217 lb (98.4 kg)    Assessment/Interventions: Review of patient past medical history, allergies, medications, health status, including review of consultants reports, laboratory and other test data, was performed as part of comprehensive evaluation and provision of chronic care management services.   SDOH:  (Social Determinants of Health) assessments and interventions performed: Yes SDOH Interventions    Flowsheet Row Chronic Care Management from 04/22/2021 in [place] Management from 01/25/2021 in [place] Management from 10/30/2020 in [place] Management from 07/23/2020 in [place] Interventions      Transportation Interventions -- -- -- Intervention Not Indicated  Financial Strain Interventions Other (Comment)  [PAP] Intervention Not Indicated Other (Comment)  [PAP] Other (Comment)  [PAP]          CCM Care Plan  Allergies  Allergen Reactions   Allegra [Fexofenadine] Nausea And Vomiting   Avelox [Moxifloxacin] Other (See  Comments)    Hallucinations   Bactrim [Sulfamethoxazole-Trimethoprim] Other (See Comments)    unknown   Etodolac Nausea Only   Fish-Derived Products Nausea And Vomiting    With "seafood"   Penicillins Diarrhea and Nausea And Vomiting   Shellfish Allergy Nausea Only   Sulfa Antibiotics Other (See Comments)    unknown   Macrobid [  Nitrofurantoin Monohyd Macro] Other (See Comments)    Constipation and globus hystericus.    Medications Reviewed Today     Reviewed by Vanita Ingles, RN (Case Manager) on 03/03/22 at [place] List Status: <None>   Medication Order Taking? Sig Documenting Provider Last Dose Status Informant  albuterol (VENTOLIN HFA) 108 (90 Base) MCG/ACT inhaler [id number] No Inhale 2 puffs into the lungs every 6 (six) hours as needed. Myles Gip, DO Taking Active   ascorbic Acid (VITAMIN C) 500 MG CPCR [id number] No Take 500 mg by mouth daily. [provider] Taking Active   aspirin EC 81 MG tablet [id number] No Take 1 tablet (81 mg total) by mouth daily. Swallow whole. Mar Daring, PA-C Taking Active   Blood Glucose Monitoring Suppl [facility] VERIO) w/Device Drucie Opitz [id number] No To check blood sugar once daily Mar Daring, PA-C Taking Active   cetirizine (ZYRTEC) 10 MG tablet [id number] No Take 1 tablet (10 mg total) by mouth daily. Margarita Rana, MD Taking Active Other  Cholecalciferol (VITAMIN D3) 50 MCG (2000 UT) TABS [id number] No Take 2,000 Units by mouth. [provider] Taking Active   conjugated estrogens (PREMARIN) vaginal cream [id number] No Place 1 Applicatorful vaginally daily.  Patient not taking: Reported on 12/11/2021   Gwyneth Sprout, FNP Not Taking Active   Continuous Blood Gluc Receiver (FREESTYLE LIBRE 14 DAY READER) DEVI [id number] No Use per package directions.  Patient not taking: Reported on 12/26/2021   Myles Gip, DO Not Taking Active   Cyanocobalamin (VITAMIN B-12) 3000 MCG SUBL [id number] No Place 1 tablet under  the tongue daily at 6 (six) AM. Gummies [provider] Taking Active   dapagliflozin propanediol (FARXIGA) 10 MG TABS tablet [id number] No Take 1 tablet (10 mg total) by mouth daily before breakfast. Patient receives via [employer] patient assistance Gwyneth Sprout, FNP Taking Active   estradiol (ESTRACE VAGINAL) 0.1 MG/GM vaginal cream [id number] No Place 1 Applicatorful vaginally at bedtime.  Patient not taking: Reported on 12/11/2021   Gwyneth Sprout, FNP Not Taking Active   fluconazole (DIFLUCAN) 150 MG tablet [id number] No Take for vaginal yeast, repeat dose in 4 days is symptoms remain.  Patient not taking: Reported on 12/26/2021   Gwyneth Sprout, FNP Not Taking Active   glipiZIDE (GLUCOTROL) 10 MG tablet [id number]  TAKE 1 TABLET BY MOUTH TWICE  DAILY BEFORE MEALS Gwyneth Sprout, FNP  Active   glucose blood [facility] VERIO) test strip [id number] No To check blood sugar once daily Myles Gip, DO Taking Active   ibuprofen (ADVIL) 200 MG tablet [id number] No Take 400 mg by mouth every 6 (six) hours as needed. [provider] Taking Active   insulin degludec (TRESIBA) 100 UNIT/ML FlexTouch Pen [id number] No Start with 10 units at night. Adjust dose by 2 units every 3 days, no sooner, to reach fasting blood sugar goal of <150. Gwyneth Sprout, FNP Taking Active   Insulin Pen Needle (PEN NEEDLES) 32G X 6 MM MISC [id number] No 1 each by Does not apply route at bedtime. Gwyneth Sprout, FNP Taking Active   Lancets [facility] ULTRASOFT) lancets [id number] No Use as instructed Myles Gip, DO Taking Active   lisinopril-hydrochlorothiazide (ZESTORETIC) 20-12.5 MG tablet [id number]  Take 2 tablets by mouth once daily [name] T, FNP  Active   loperamide (IMODIUM) 2 MG capsule [id number] No Take 2 capsules (4 mg total) by mouth 4 (four) times daily as needed for diarrhea  or loose stools. Gwyneth Sprout, FNP Taking Active   montelukast (SINGULAIR) 10 MG tablet [id number]  Take 1 tablet by mouth  once daily [name] T, FNP  Active   pantoprazole (PROTONIX) 40 MG tablet [id number] No Take 1 tablet by mouth once daily [name] T, FNP Taking Active   rosuvastatin (CRESTOR) 10 MG tablet [id number]  Take 1 tablet by mouth once daily [name] T, FNP  Active   scopolamine (TRANSDERM-SCOP) 1 MG/3DAYS [id number] No Place 1 patch (1.5 mg total) onto the skin every 3 (three) days. Gwyneth Sprout, FNP Taking Active   spironolactone (ALDACTONE) 25 MG tablet [id number]  Take 1 tablet by mouth once daily [name] T, FNP  Active   SUMAtriptan (IMITREX) 50 MG tablet [id number] No Take 1 tablet (50 mg total) by mouth every 2 (two) hours as needed for migraine. No more than 24m in 24 hours BMar Daring PVermontTaking Active Other            Patient Active Problem List   Diagnosis Date Noted   Itching in the vaginal area 11/29/2021   Motion sickness 11/29/2021   Acquired deformity of left toe 11/29/2021   Vaginal dryness, menopausal 10/10/2021   Vaginal discharge 10/10/2021   Acute cystitis with hematuria 10/10/2021   Urinary frequency 08/30/2021   Urge incontinence of urine 08/30/2021   Vaginal yeast infection 08/30/2021   Annual physical exam 05/17/2021   Screening for cervical cancer 05/17/2021   Encounter for screening mammogram for malignant neoplasm of breast 05/17/2021   Elevated LDL cholesterol level 05/17/2021   Iron deficiency anemia secondary to inadequate dietary iron intake 05/17/2021   Hyperlipidemia associated with type 2 diabetes mellitus ([place] 05/17/2021   Uncontrolled type 2 diabetes mellitus with hyperglycemia, with long-term current use of insulin ([place] 05/17/2021   Need for shingles vaccine 05/17/2021   Routine screening for STI (sexually transmitted infection) 05/17/2021   Itchy skin 05/17/2021   Dry skin dermatitis 05/17/2021   Pain due to onychomycosis of toenails of both feet 065/46/5035  History of Helicobacter pylori infection 11/05/2017    Heartburn    Diverticulosis of large intestine without diverticulitis    Warts of foot 06/30/2017   Hallux rigidus of left foot 01/28/2017   Hallux rigidus, right foot 01/28/2017   Pes planus 01/28/2017   Diabetic neuropathy ([place] 11/18/2014   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   Hypercholesteremia 11/18/2014   Personal history of breast cancer 10/04/2014    Immunization History  Administered Date(s) Administered   PFIZER Comirnaty(Gray Top)Covid-19 Tri-Sucrose Vaccine 08/15/2020   PFIZER(Purple Top)SARS-COV-2 Vaccination 07/31/2019, 09/13/2019   Pfizer Covid-19 Vaccine Bivalent Booster 118yr& up 03/28/2021   Pneumococcal Polysaccharide-23 06/22/2009, 08/06/2015   Tdap 08/31/2017   Zoster Recombinat (Shingrix) 08/15/2021    Conditions to be addressed/monitored:  Hypertension, Hyperlipidemia, Diabetes, GERD, Asthma and Osteoarthritis  Care Plan : General Pharmacy (Adult)  Updates made by FlGermaine PomfretRPSandyince 03/07/2022 12:00 AM     Problem: Hypertension, Hyperlipidemia, Diabetes, GERD, Asthma and Osteoarthritis   Priority: High     Long-Range Goal: Patient-Specific Goal   Start Date: 07/23/2020  Expected End Date: 03/08/2023  This Visit's Progress: On track  Recent Progress: On track  Priority: High  Note:   Current Barriers:  Unable to independently afford treatment regimen  Pharmacist Clinical Goal(s):  Over the next 90 days, patient will verbalize ability to afford treatment regimen maintain control of Diabetes as evidenced by A1c less than 8%  through collaboration with PharmD and provider.    Interventions: 1:1 collaboration with [name], FNP regarding development and update of comprehensive plan of care as evidenced by provider attestation and co-signature Inter-disciplinary care team collaboration (see longitudinal plan of care) Comprehensive medication review performed; medication list updated in electronic medical record  Hypertension (BP goal  <140/90) -Controlled -Current treatment: Lisinopril-HCTZ 20-12.5 mg 2 tablets daily  Spironolactone 25 mg daily  -Medications previously tried: Amlodipine, diltiazem (hypotension)   -Dizziness symptoms have improved with discontinuation of verapamil, energy improved overall. -Current home readings:   -Current dietary habits: Limiting fried food. Eating more vegetables  -Current exercise habits: PT weekly -Denies hypotensive/hypertensive symptoms.  -Continue current medications  Hyperlipidemia: (LDL goal < 70) -uncontrolled -Current treatment: Rosuvastatin 10 mg daily -Medications previously tried: Pravastatin, Lovastatin Importance of limiting foods high in cholesterol; -Recommended rechecking lipid panel at PCP follow-up.   Diabetes (A1c goal <8%) -Managed by Mee Hives -Uncontrolled -Current medications: Farxiga 10 mg daily  Glipizide 5  mg twice daily Tresiba 20 daily  -Medications previously tried: Januvia, Metformin (diarrhea), Pioglitazone ([place]), Trulicity, Rybelsus (ineffective) -Current home glucose readings fasting glucose:  -Denies hypoglycemic/hyperglycemic symptoms -Drinking water and ice tea.  -Continue current medications   Asthma (Goal: control symptoms and prevent exacerbations) -controlled -Current treatment  Ventolin HFA 108 mcg/act 2 puff every 6 hours as needed  Montelukast 10 mg daily  -Medications previously tried: NA  -Pulmonary function testing: NA -Exacerbations requiring treatment in last 6 months: Yes, due to recent covid infection -Patient denies consistent use of maintenance inhaler -Frequency of rescue inhaler use: twice weekly -Counseled on Proper inhaler technique; When to use rescue inhaler -Recommended to continue current medication  GERD (Goal: prevent symptoms of heartburn and reflux) -controlled -Current treatment  Pantoprazole 40 mg daily  -Medications previously tried: NA  -Recommended to continue current  medication  Osteoarthritis (Goal: minimize arthritis pain) -Controlled -Current treatment  Acetaminophen CR 650 mg every 8 hours as needed Ibuprofen 200 mg 2 tablets every 6 hours as needed  -Counseled to avoid greater than 3000 mg of acetaminophen daily  -Recommended to continue current medication  Patient Goals/Self-Care Activities Over the next 90 days, patient will:  - check glucose daily (before breakfast), document, and provide at future appointments  Follow Up Plan: Telephone follow up appointment with care management team member scheduled for:  06/17/2022 at 11:00 AM      Medication Assistance:  Wilder Glade obtained through [employer] medication assistance program.  Enrollment ends Dec 2023  Patient's preferred pharmacy is:  [facility] [address] ([address]), [place]) [place] [address] Phone: [phone] Fax: [phone]  [facility] - [place], [place] [facility] [address] [place] [address] Phone: [phone] Fax: [phone]  [place], [place] [address] Phone: [phone] Fax: [place], [place] [address] [place] [address] Phone: [phone] Fax: [phone]  Uses pill box? Yes Pt endorses 100% compliance  We discussed: Current pharmacy is preferred with insurance plan and patient is satisfied with pharmacy services Patient decided to: Continue current medication management strategy  Care Plan and Follow Up Patient Decision:  Patient agrees to Care Plan and Follow-up.  Plan: Telephone follow up appointment with care management team member  scheduled for:  06/17/2022 at 11:00 AM  [name], PharmD, Para March, [place] [phone]

## 2022-02-25 NOTE — Telephone Encounter (Signed)
Called, spoke with patient. Advised her that [place] pen was ready for pick up. She verbalized understanding and states she is on her way to pick up medication.

## 2022-03-03 ENCOUNTER — Ambulatory Visit: Payer: Self-pay

## 2022-03-03 DIAGNOSIS — E1165 Type 2 diabetes mellitus with hyperglycemia: Secondary | ICD-10-CM

## 2022-03-03 NOTE — Patient Instructions (Signed)
Visit Information  Thank you for taking time to visit with me today. Please don't hesitate to contact me if I can be of assistance to you.   Following are the goals we discussed today:   Goals Addressed             This Visit's Progress    RNCM: Effective Management of DM       Care Coordination Interventions:  Lab Results  Component Value Date   HGBA1C 11.6 (A) 11/29/2021    Provided education to patient about basic DM disease process Reviewed medications with patient and discussed importance of medication adherence Counseled on importance of regular laboratory monitoring as prescribed Discussed plans with patient for ongoing care management follow up and provided patient with direct contact information for care management team Provided patient with written educational materials related to hypo and hyperglycemia and importance of correct treatment Reviewed scheduled/upcoming provider appointments including: 05-29-2022 at 1020 am. The patient sees the eye specialist on Wednesday Advised patient, providing education and rationale, to check cbg BID  and record, calling pcp or endocrinologist  for findings outside established parameters. The patient is a little frustrated about her blood sugars being elevated. The patient states this am was 224. Education on the goal of fasting of <130 and post prandial of <180. Education and support given. Referral made to pharmacy team for assistance with questions about Lisinopril and asking about changes that she can make Care guide referral for help with food resources in the community Review of patient status, including review of consultants reports, relevant laboratory and other test results, and medications completed Screening for signs and symptoms of depression related to chronic disease state  Assessed social determinant of health barriers The patient agrees to the care coordination program and working with the [facility] for help in managing DM and  other chronic conditions. The patient knows how to reach the [facility] and knows the [facility] will reach out on a regular basis.            Our next appointment is by telephone on 04-30-2022 at 2 pm  Please call the care guide team at [phone] if you need to cancel or reschedule your appointment.   If you are experiencing a Mental Health or [place] or need someone to talk to, please call the Suicide and Crisis Lifeline: 988 call the [place] National Suicide Prevention Lifeline: 819-808-5918 or TTY: 316-089-9953 TTY (504)490-1426) to talk to a trained counselor call 1-800-273-TALK (toll free, 24 hour hotline)  The patient verbalized understanding of instructions, educational materials, and care plan provided today and DECLINED offer to receive copy of patient instructions, educational materials, and care plan.   Telephone follow up appointment with care management team member scheduled for: 04-30-2022 at 2 pm  [place], MSN, [place]  Mobile: [phone]

## 2022-03-04 ENCOUNTER — Telehealth: Payer: Self-pay

## 2022-03-04 NOTE — Telephone Encounter (Signed)
   Telephone encounter was:  Unsuccessful.  03/04/2022 Name: Alexis Lloyd MRN: [id number] DOB: 07-Mar-1961  Unsuccessful outbound call made today to assist with:  Food Insecurity  Outreach Attempt:  1st Attempt  A HIPAA compliant voice message was left requesting a return call.  Instructed patient to call back at [phone] at their earliest convenience.  [place] management  [place], [place]  Main Phone: [phone]  E-mail: [email or web address]  Website: [email or web address]

## 2022-03-05 ENCOUNTER — Telehealth: Payer: Self-pay

## 2022-03-05 DIAGNOSIS — H2513 Age-related nuclear cataract, bilateral: Secondary | ICD-10-CM | POA: Diagnosis not present

## 2022-03-05 LAB — HM DIABETES EYE EXAM

## 2022-03-05 NOTE — Telephone Encounter (Signed)
   Telephone encounter was:  Successful.  03/05/2022 Name: Alexis Lloyd MRN: [id number] DOB: 1961/04/16  Alexis Lloyd is a 61 y.o. year old female who is a primary care patient of Gwyneth Sprout, FNP . The community resource team was consulted for assistance with [place] guide performed the following interventions: Patient provided with information about care guide support team and interviewed to confirm resource needs Discussed resources to assist with Food. Patient advised at this time, she is only need resources for Food Pantries and she would like information to her mailing address on file. Patient advised she does not needs meals at this time, if she does, she can contact [facility] for meals. Patient does not qualify for MOWs at this time either, therefore CG will send out [place] along with [employer] to Assistance. Pt has been advised: I have mailed the following information and if she has not received the information in 7 to 14 days or if she has any additional questions to please call me back at [phone]. Patient understood.  Follow Up Plan:  No further follow up planned at this time. The patient has been provided with needed resources.  [place] management  [place], [place]  Main Phone: [phone]  E-mail: [email or web address]  Website: [email or web address]

## 2022-03-06 ENCOUNTER — Encounter: Payer: Self-pay | Admitting: Family Medicine

## 2022-03-07 ENCOUNTER — Telehealth: Payer: Self-pay

## 2022-03-07 NOTE — Progress Notes (Signed)
Chronic Care Management Pharmacy Assistant   Name: Alexis Lloyd  MRN: [id number] DOB: Dec 11, 1960  Reason for Encounter: Medication Review/Patient assistance update for [place].   Recent office visits:  None ID  Recent consult visits:  03/03/2022 Dellie Catholic RN [facility] Coordination) No medication Changes noted, AMB Referral to [facility].   Hospital visits:  None in previous 6 months  Medications: Outpatient Encounter Medications as of 03/07/2022  Medication Sig   albuterol (VENTOLIN HFA) 108 (90 Base) MCG/ACT inhaler Inhale 2 puffs into the lungs every 6 (six) hours as needed.   ascorbic Acid (VITAMIN C) 500 MG CPCR Take 500 mg by mouth daily.   aspirin EC 81 MG tablet Take 1 tablet (81 mg total) by mouth daily. Swallow whole.   Blood Glucose Monitoring Suppl (ONETOUCH VERIO) w/Device KIT To check blood sugar once daily   cetirizine (ZYRTEC) 10 MG tablet Take 1 tablet (10 mg total) by mouth daily.   Cholecalciferol (VITAMIN D3) 50 MCG (2000 UT) TABS Take 2,000 Units by mouth.   conjugated estrogens (PREMARIN) vaginal cream Place 1 Applicatorful vaginally daily. (Patient not taking: Reported on 12/11/2021)   Continuous Blood Gluc Receiver (FREESTYLE LIBRE 14 DAY READER) DEVI Use per package directions. (Patient not taking: Reported on 12/26/2021)   Cyanocobalamin (VITAMIN B-12) 3000 MCG SUBL Place 1 tablet under the tongue daily at 6 (six) AM. Gummies   dapagliflozin propanediol (FARXIGA) 10 MG TABS tablet Take 1 tablet (10 mg total) by mouth daily before breakfast. Patient receives via [employer] patient assistance   estradiol (ESTRACE VAGINAL) 0.1 MG/GM vaginal cream Place 1 Applicatorful vaginally at bedtime. (Patient not taking: Reported on 12/11/2021)   fluconazole (DIFLUCAN) 150 MG tablet Take for vaginal yeast, repeat dose in 4 days is symptoms remain. (Patient not taking: Reported on 12/26/2021)   glipiZIDE (GLUCOTROL) 10 MG tablet TAKE 1 TABLET BY MOUTH  TWICE  DAILY BEFORE MEALS   glucose blood (ONETOUCH VERIO) test strip To check blood sugar once daily   ibuprofen (ADVIL) 200 MG tablet Take 400 mg by mouth every 6 (six) hours as needed.   insulin degludec (TRESIBA) 100 UNIT/ML FlexTouch Pen Start with 10 units at night. Adjust dose by 2 units every 3 days, no sooner, to reach fasting blood sugar goal of <150.   Insulin Pen Needle (PEN NEEDLES) 32G X 6 MM MISC 1 each by Does not apply route at bedtime.   Lancets (ONETOUCH ULTRASOFT) lancets Use as instructed   lisinopril-hydrochlorothiazide (ZESTORETIC) 20-12.5 MG tablet Take 2 tablets by mouth once daily   loperamide (IMODIUM) 2 MG capsule Take 2 capsules (4 mg total) by mouth 4 (four) times daily as needed for diarrhea or loose stools.   montelukast (SINGULAIR) 10 MG tablet Take 1 tablet by mouth once daily   pantoprazole (PROTONIX) 40 MG tablet Take 1 tablet by mouth once daily   rosuvastatin (CRESTOR) 10 MG tablet Take 1 tablet by mouth once daily   scopolamine (TRANSDERM-SCOP) 1 MG/3DAYS Place 1 patch (1.5 mg total) onto the skin every 3 (three) days.   spironolactone (ALDACTONE) 25 MG tablet Take 1 tablet by mouth once daily   SUMAtriptan (IMITREX) 50 MG tablet Take 1 tablet (50 mg total) by mouth every 2 (two) hours as needed for migraine. No more than 228m in 24 hours   No facility-administered encounter medications on file as of 03/07/2022.    Care Gaps: Shingrix Vaccine COVID-19 Vaccine Influenza Vaccine;O A1C -11.6% on 11/29/2021 -` 20  Star Rating Drugs: Lisinopril-HCTZ 20-12.5 mg last filled on 01/01/2022 for 90 day supply at [facility]. Glipizide 10 mg last filled on 01/29/2022 for 100 day supply at [facility]. Rosuvastatin 10 mg last filled on 02/19/2022 for 90 day supply at [facility]. Farxiga 10 mg last filled 10/01/2021 for 30 day supply at [facility] (receives from [employer]).    Medication fill Gaps: None  I reach out to [employer] to get the  status of patient assistance application for Tresbia.   Per [employer], patient was approved with start date of 03/07/2022, and end date of 06/08/2022.Patient will need to re enrolled before 06/08/2022, and can start re enrollment after 03/23/2022. Patient order will be ship today, and shipment can take 10-14 business days for patient PCP to receive it.   Patient verbalized understanding, and states she may need samples as she has one pen left on hand.Patient states she will return my call or reach out to the front desk if she does.  [place] Pharmacist Assistant [phone]

## 2022-03-07 NOTE — Patient Instructions (Signed)
Visit Information It was great speaking with you today!  Please let me know if you have any questions about our visit.   Goals Addressed             This Visit's Progress    Monitor and Manage My Blood Sugar-Diabetes Type 2   On track    Timeframe:  Long-Range Goal Priority:  High Start Date:    07/23/2020                         Expected End Date:   01/21/2023                    Follow Up within 30 days    - check blood sugar at prescribed times - check blood sugar if I feel it is too high or too low - take the blood sugar log to all doctor visits    Why is this important?   Checking your blood sugar at home helps to keep it from getting very high or very low.  Writing the results in a diary or log helps the doctor know how to care for you.  Your blood sugar log should have the time, date and the results.  Also, write down the amount of insulin or other medicine that you take.  Other information, like what you ate, exercise done and how you were feeling, will also be helpful.     Notes:         Patient Care Plan: General Pharmacy (Adult)     Problem Identified: Hypertension, Hyperlipidemia, Diabetes, GERD, Asthma and Osteoarthritis   Priority: High     Long-Range Goal: Patient-Specific Goal   Start Date: 07/23/2020  Expected End Date: 03/08/2023  This Visit's Progress: On track  Recent Progress: On track  Priority: High  Note:   Current Barriers:  Unable to independently afford treatment regimen  Pharmacist Clinical Goal(s):  Over the next 90 days, patient will verbalize ability to afford treatment regimen maintain control of Diabetes as evidenced by A1c less than 8%  through collaboration with PharmD and provider.    Interventions: 1:1 collaboration with [name], FNP regarding development and update of comprehensive plan of care as evidenced by provider attestation and co-signature Inter-disciplinary care team collaboration (see longitudinal plan of  care) Comprehensive medication review performed; medication list updated in electronic medical record  Hypertension (BP goal <140/90) -Controlled -Current treatment: Lisinopril-HCTZ 20-12.5 mg 2 tablets daily  Spironolactone 25 mg daily  -Medications previously tried: Amlodipine, diltiazem (hypotension)   -Dizziness symptoms have improved with discontinuation of verapamil, energy improved overall. -Current home readings:   -Current dietary habits: Limiting fried food. Eating more vegetables  -Current exercise habits: PT weekly -Denies hypotensive/hypertensive symptoms.  -Continue current medications  Hyperlipidemia: (LDL goal < 70) -uncontrolled -Current treatment: Rosuvastatin 10 mg daily -Medications previously tried: Pravastatin, Lovastatin Importance of limiting foods high in cholesterol; -Recommended rechecking lipid panel at PCP follow-up.   Diabetes (A1c goal <8%) -Managed by Mee Hives -Uncontrolled -Current medications: Farxiga 10 mg daily  Glipizide 5  mg twice daily Tresiba 20 daily  -Medications previously tried: Januvia, Metformin (diarrhea), Pioglitazone ([place]), Trulicity, Rybelsus (ineffective) -Current home glucose readings fasting glucose:  -Denies hypoglycemic/hyperglycemic symptoms -Drinking water and ice tea.  -Continue current medications   Asthma (Goal: control symptoms and prevent exacerbations) -controlled -Current treatment  Ventolin HFA 108 mcg/act 2 puff every 6 hours as needed  Montelukast 10 mg daily  -Medications  previously tried: NA  -Pulmonary function testing: NA -Exacerbations requiring treatment in last 6 months: Yes, due to recent covid infection -Patient denies consistent use of maintenance inhaler -Frequency of rescue inhaler use: twice weekly -Counseled on Proper inhaler technique; When to use rescue inhaler -Recommended to continue current medication  GERD (Goal: prevent symptoms of heartburn and  reflux) -controlled -Current treatment  Pantoprazole 40 mg daily  -Medications previously tried: NA  -Recommended to continue current medication  Osteoarthritis (Goal: minimize arthritis pain) -Controlled -Current treatment  Acetaminophen CR 650 mg every 8 hours as needed Ibuprofen 200 mg 2 tablets every 6 hours as needed  -Counseled to avoid greater than 3000 mg of acetaminophen daily  -Recommended to continue current medication  Patient Goals/Self-Care Activities Over the next 90 days, patient will:  - check glucose daily (before breakfast), document, and provide at future appointments  Follow Up Plan: Telephone follow up appointment with care management team member scheduled for:  06/17/2022 at 11:00 AM      Patient agreed to services and verbal consent obtained.   The patient verbalized understanding of instructions, educational materials, and care plan provided today and DECLINED offer to receive copy of patient instructions, educational materials, and care plan.   [name], PharmD, [place], CPP  Clinical Pharmacist Practitioner  [facility] [phone]

## 2022-03-08 DIAGNOSIS — I10 Essential (primary) hypertension: Secondary | ICD-10-CM

## 2022-03-08 DIAGNOSIS — J45909 Unspecified asthma, uncomplicated: Secondary | ICD-10-CM

## 2022-03-08 DIAGNOSIS — E785 Hyperlipidemia, unspecified: Secondary | ICD-10-CM

## 2022-03-08 DIAGNOSIS — M1990 Unspecified osteoarthritis, unspecified site: Secondary | ICD-10-CM

## 2022-03-08 DIAGNOSIS — Z794 Long term (current) use of insulin: Secondary | ICD-10-CM

## 2022-03-08 DIAGNOSIS — E1159 Type 2 diabetes mellitus with other circulatory complications: Secondary | ICD-10-CM

## 2022-03-11 ENCOUNTER — Telehealth: Payer: Self-pay

## 2022-03-11 NOTE — Progress Notes (Signed)
Chronic Care Management Pharmacy Assistant   Name: SELEN SMUCKER  MRN: [id number] DOB: 12-22-1960  Reason for Encounter: Medication Review/Patient assistance renewal for Alexis Lloyd,    Recent office visits:  None ID  Recent consult visits:  None ID  Hospital visits:  None in previous 6 months  Medications: Outpatient Encounter Medications as of 03/11/2022  Medication Sig   albuterol (VENTOLIN HFA) 108 (90 Base) MCG/ACT inhaler Inhale 2 puffs into the lungs every 6 (six) hours as needed.   ascorbic Acid (VITAMIN C) 500 MG CPCR Take 500 mg by mouth daily.   aspirin EC 81 MG tablet Take 1 tablet (81 mg total) by mouth daily. Swallow whole.   Blood Glucose Monitoring Suppl (ONETOUCH VERIO) w/Device KIT To check blood sugar once daily   cetirizine (ZYRTEC) 10 MG tablet Take 1 tablet (10 mg total) by mouth daily.   Cholecalciferol (VITAMIN D3) 50 MCG (2000 UT) TABS Take 2,000 Units by mouth.   conjugated estrogens (PREMARIN) vaginal cream Place 1 Applicatorful vaginally daily. (Patient not taking: Reported on 12/11/2021)   Continuous Blood Gluc Receiver (FREESTYLE LIBRE 14 DAY READER) DEVI Use per package directions. (Patient not taking: Reported on 12/26/2021)   Cyanocobalamin (VITAMIN B-12) 3000 MCG SUBL Place 1 tablet under the tongue daily at 6 (six) AM. Gummies   dapagliflozin propanediol (FARXIGA) 10 MG TABS tablet Take 1 tablet (10 mg total) by mouth daily before breakfast. Patient receives via [employer] patient assistance   estradiol (ESTRACE VAGINAL) 0.1 MG/GM vaginal cream Place 1 Applicatorful vaginally at bedtime. (Patient not taking: Reported on 12/11/2021)   fluconazole (DIFLUCAN) 150 MG tablet Take for vaginal yeast, repeat dose in 4 days is symptoms remain. (Patient not taking: Reported on 12/26/2021)   glipiZIDE (GLUCOTROL) 10 MG tablet TAKE 1 TABLET BY MOUTH TWICE  DAILY BEFORE MEALS   glucose blood (ONETOUCH VERIO) test strip To check blood sugar once daily   ibuprofen (ADVIL)  200 MG tablet Take 400 mg by mouth every 6 (six) hours as needed.   insulin degludec (TRESIBA) 100 UNIT/ML FlexTouch Pen Start with 10 units at night. Adjust dose by 2 units every 3 days, no sooner, to reach fasting blood sugar goal of <150.   Insulin Pen Needle (PEN NEEDLES) 32G X 6 MM MISC 1 each by Does not apply route at bedtime.   Lancets (ONETOUCH ULTRASOFT) lancets Use as instructed   lisinopril-hydrochlorothiazide (ZESTORETIC) 20-12.5 MG tablet Take 2 tablets by mouth once daily   loperamide (IMODIUM) 2 MG capsule Take 2 capsules (4 mg total) by mouth 4 (four) times daily as needed for diarrhea or loose stools.   montelukast (SINGULAIR) 10 MG tablet Take 1 tablet by mouth once daily   pantoprazole (PROTONIX) 40 MG tablet Take 1 tablet by mouth once daily   rosuvastatin (CRESTOR) 10 MG tablet Take 1 tablet by mouth once daily   scopolamine (TRANSDERM-SCOP) 1 MG/3DAYS Place 1 patch (1.5 mg total) onto the skin every 3 (three) days.   spironolactone (ALDACTONE) 25 MG tablet Take 1 tablet by mouth once daily   SUMAtriptan (IMITREX) 50 MG tablet Take 1 tablet (50 mg total) by mouth every 2 (two) hours as needed for migraine. No more than 232m in 24 hours   No facility-administered encounter medications on file as of 03/11/2022.    Care Gaps: Shingrix Vaccine COVID-19 Vaccine Influenza Vaccine;O A1C -11.6% on 11/29/2021    Star Rating Drugs: Lisinopril-HCTZ 20-12.5 mg last filled on 01/01/2022 for 90 day supply at  [facility]. Glipizide 10 mg last filled on 01/29/2022 for 100 day supply at [facility]. Rosuvastatin 10 mg last filled on 02/19/2022 for 90 day supply at [facility]. Farxiga 10 mg last filled 10/01/2021 for 30 day supply at [facility] (receives from [facility]).    Medication fill Gaps: None   Patient assistance renewal for Tresiba:  I received a task from [name], CPP requesting that I start the renewal application for patient assistance on the  medication Alexis Lloyd to continue assistance through year 2024.    Unable to speak  with the patient to inform her that the application will be mailed.Once she receives the application she will need to complete her part of the application and return it to her PCP office for [name], CPP to fax over to [employer] for processing. Patient should include a copy of her proof of income.     Application emailed to [name], CPP for review and to mail to patient home.  [place] Pharmacist Assistant [phone]

## 2022-03-17 ENCOUNTER — Encounter: Payer: Medicare Other | Attending: Family Medicine | Admitting: *Deleted

## 2022-03-17 ENCOUNTER — Encounter: Payer: Self-pay | Admitting: *Deleted

## 2022-03-17 VITALS — BP 110/60 | Wt 212.8 lb

## 2022-03-17 DIAGNOSIS — E1165 Type 2 diabetes mellitus with hyperglycemia: Secondary | ICD-10-CM | POA: Insufficient documentation

## 2022-03-17 NOTE — Progress Notes (Signed)
Diabetes Self-Management Education  Visit Type: Follow-up  Appt. Start Time: 1330 Appt. End Time: 1405  She was unable to stay longer. Her son was in the Urgent Care and she was called to come and pick him up.  03/17/2022  [name], identified by name and date of birth, is a 61 y.o. female with a diagnosis of Diabetes: Type 2.   ASSESSMENT  Blood pressure 110/60, weight 212 lb 12.8 oz (96.5 kg). Body mass index is 42.98 kg/m.   Diabetes Self-Management Education - 03/17/22 1416       Visit Information   Visit Type Follow-up      Initial Visit   Diabetes Type Type 2      Complications   How often do you check your blood sugar? 1-2 times/day    Fasting Blood glucose range (mg/dL) 130-179;180-200;>200   She reports FBG's range from 150-202 mg/dL. She has stopped on 22 units of Tresiba but endo instructed her to increase 1 unit/day until FBG consistenly less than 150 mg/dL.   Number of hypoglycemic episodes per month 0    Number of hyperglycemic episodes ( >'200mg'$ /dL): Weekly    Can you tell when your blood sugar is high? Yes   headache   What do you do if your blood sugar is high? drink water    Have you had a dilated eye exam in the past 12 months? Yes    Have you had a dental exam in the past 12 months? Yes    Are you checking your feet? Yes    How many days per week are you checking your feet? 6      Dietary Intake   Breakfast 2 1/2 meals and 1 snack/day    Beverage(s) water mostly now - reports down to [employer] 2 x week instead of 2 x day      Activity / Exercise   Activity / Exercise Type ADL's      Patient Education   Disease Pathophysiology Factors that contribute to the development of diabetes;Explored patient's options for treatment of their diabetes    Healthy Eating Role of diet in the treatment of diabetes and the relationship between the three main macronutrients and blood glucose level;Food label reading, portion sizes and measuring food.;Reviewed  blood glucose goals for pre and post meals and how to evaluate the patients' food intake on their blood glucose level.;Meal timing in regards to the patients' current diabetes medication.    Being Active Role of exercise on diabetes management, blood pressure control and cardiac health.    Medications Taught/reviewed insulin/injectables, injection, site rotation, insulin/injectables storage and needle disposal.;Reviewed patients medication for diabetes, action, purpose, timing of dose and side effects.    Monitoring Purpose and frequency of SMBG.;Taught/discussed recording of test results and interpretation of SMBG.;Identified appropriate SMBG and/or A1C goals.   She still hasn't started using CGM. Reports she wants to use the [facility] but MD hasn't ordered yet. She has never started using the [employer].   Acute complications Taught prevention, symptoms, and  treatment of hypoglycemia - the 15 rule.    Chronic complications Relationship between chronic complications and blood glucose control    Diabetes Stress and Support Identified and addressed patients feelings and concerns about diabetes      Individualized Goals (developed by patient)   Nutrition Follow meal plan discussed    Physical Activity Exercise 3-5 times per week;15 minutes per day    Medications take my medication as prescribed  Monitoring  Test my blood glucose as discussed      Post-Education Assessment   Patient understands the diabetes disease and treatment process. Comprehends key points    Patient understands incorporating nutritional management into lifestyle. Needs Review    Patient undertands incorporating physical activity into lifestyle. Needs Review    Patient understands using medications safely. Needs Review    Patient understands monitoring blood glucose, interpreting and using results Demonstrates understanding / competency    Patient understands prevention, detection, and treatment of acute complications.  Demonstrates understanding / competency    Patient understands prevention, detection, and treatment of chronic complications. Demonstrates understanding / competency    Patient understands how to develop strategies to address psychosocial issues. Comprehends key points    Patient understands how to develop strategies to promote health/change behavior. Comprehends key points      Outcomes   Expected Outcomes Demonstrated interest in learning. Expect positive outcomes    Program Status Completed      Subsequent Visit   Since your last visit have you continued or begun to take your medications as prescribed? Yes    Since your last visit have you had your blood pressure checked? Yes    Is your most recent blood pressure lower, unchanged, or higher since your last visit? Lower    Since your last visit have you experienced any weight changes? Loss    Weight Loss (lbs) 2.8    Since your last visit, are you checking your blood glucose at least once a day? Yes         Individualized Plan for Diabetes Self-Management Training:   Learning Objective:  Patient will have a greater understanding of diabetes self-management. Patient education plan is to attend individual and/or group sessions per assessed needs and concerns.   Plan:   Patient Instructions  Check blood sugars before breakfast every day and occasionally check 2 hrs after supper   Exercise: Begin walking for    15  minutes   3  days a week and gradually increase to 30 minutes 5 x week  Eat 3 meals day,   1  snack a day Space meals 4-6 hours apart Don't skip meals - eat 1 protein and 1 carbohydrate serving Avoid fruit at bedtime  Include 1 serving of protein when eating fruit Continue to avoid sugar sweetened drinks (soda)  Carry fast acting glucose and a snack at all times Rotate injection sites   Expected Outcomes:  Demonstrated interest in learning. Expect positive outcomes  Education material provided:  Planning a  Balanced Meal Healthy Snack Choices (ADA)  If problems or questions, patient to contact team via:   Alexis Drilling, RN, CCM, [place] [phone]  Future DSME appointment:  PRN

## 2022-03-17 NOTE — Patient Instructions (Signed)
Check blood sugars before breakfast every day and occasionally check 2 hrs after supper   Exercise: Begin walking for    15  minutes   3  days a week and gradually increase to 30 minutes 5 x week  Eat 3 meals day,   1  snack a day Space meals 4-6 hours apart Don't skip meals - eat 1 protein and 1 carbohydrate serving Avoid fruit at bedtime  Include 1 serving of protein when eating fruit Continue to avoid sugar sweetened drinks (soda)  Carry fast acting glucose and a snack at all times Rotate injection sites

## 2022-03-18 ENCOUNTER — Telehealth: Payer: Self-pay | Admitting: Physician Assistant

## 2022-03-18 NOTE — Telephone Encounter (Signed)
Patient asking for refills on Premarin Vaginal Cream and Diflucan please.   [facility] on [address]

## 2022-03-20 ENCOUNTER — Other Ambulatory Visit: Payer: Self-pay | Admitting: Family Medicine

## 2022-03-20 DIAGNOSIS — N951 Menopausal and female climacteric states: Secondary | ICD-10-CM

## 2022-03-20 DIAGNOSIS — B3731 Acute candidiasis of vulva and vagina: Secondary | ICD-10-CM

## 2022-03-20 MED ORDER — PREMARIN 0.625 MG/GM VA CREA: 1.0000 | TOPICAL_CREAM | Freq: Every day | VAGINAL | 12 refills | Status: DC

## 2022-03-20 MED ORDER — FLUCONAZOLE 150 MG PO TABS: ORAL_TABLET | ORAL | 0 refills | Status: DC

## 2022-04-01 ENCOUNTER — Telehealth: Payer: Self-pay

## 2022-04-01 NOTE — Progress Notes (Signed)
Per Clinical pharmacist, please let her know that for her renewal patient assistance, I am adding ozempic as requested by Dr. Honor Junes. When she gets the medication she should reach out to him to discuss how he would want her to start the medication. Also let her know she missed a signature at the bottom of page 2. Patient checked that she did not want anyone speaking on her behalf, which means we will not be able to discuss her application for her.  Patient verbalized understanding, and ask if the Clinical pharmacist can  leave the application at the front desk for her to sign, and if she can change it to yes instead of no so I can check the process of the application. Notified Clinical pharmacist.  [place] Clinical Pharmacist Assistant [phone]

## 2022-04-03 ENCOUNTER — Ambulatory Visit: Payer: Self-pay

## 2022-04-03 ENCOUNTER — Encounter: Payer: Self-pay | Admitting: Physician Assistant

## 2022-04-03 ENCOUNTER — Ambulatory Visit (INDEPENDENT_AMBULATORY_CARE_PROVIDER_SITE_OTHER): Payer: Medicare Other | Admitting: Physician Assistant

## 2022-04-03 VITALS — BP 104/57 | HR 84 | Temp 98.5°F | Resp 16 | Wt 214.0 lb

## 2022-04-03 DIAGNOSIS — E1165 Type 2 diabetes mellitus with hyperglycemia: Secondary | ICD-10-CM | POA: Diagnosis not present

## 2022-04-03 DIAGNOSIS — R0602 Shortness of breath: Secondary | ICD-10-CM

## 2022-04-03 DIAGNOSIS — R042 Hemoptysis: Secondary | ICD-10-CM | POA: Diagnosis not present

## 2022-04-03 DIAGNOSIS — R051 Acute cough: Secondary | ICD-10-CM | POA: Diagnosis not present

## 2022-04-03 DIAGNOSIS — I952 Hypotension due to drugs: Secondary | ICD-10-CM

## 2022-04-03 DIAGNOSIS — Z794 Long term (current) use of insulin: Secondary | ICD-10-CM | POA: Diagnosis not present

## 2022-04-03 DIAGNOSIS — J4520 Mild intermittent asthma, uncomplicated: Secondary | ICD-10-CM

## 2022-04-03 DIAGNOSIS — J0110 Acute frontal sinusitis, unspecified: Secondary | ICD-10-CM | POA: Diagnosis not present

## 2022-04-03 MED ORDER — ALBUTEROL SULFATE HFA 108 (90 BASE) MCG/ACT IN AERS: 2.0000 | INHALATION_SPRAY | Freq: Four times a day (QID) | RESPIRATORY_TRACT | 0 refills | Status: DC | PRN

## 2022-04-03 MED ORDER — AZITHROMYCIN 250 MG PO TABS: ORAL_TABLET | ORAL | 0 refills | Status: AC

## 2022-04-03 NOTE — Telephone Encounter (Signed)
  Chief Complaint: Cough with blood in phlegm, mild SOB Symptoms: Cough, no taste Frequency: Monday Pertinent Negatives: Patient denies fever Disposition: '[]'$ ED /'[]'$ Urgent Care (no appt availability in office) / '[x]'$ Appointment(In office/virtual)/ '[]'$  [facility]/ '[]'$ Home Care/ '[]'$ Refused Recommended Disposition /'[]'$ [facility]/ '[]'$  Follow-up with PCP Additional Notes: PT has been feeling poorly since Monday. She has mild sob and has yellow phlegm with blood in it when coughing. Pt is diabetic.   Reason for Disposition  [1] Coughed up blood AND [2] > 1 tablespoon (15 ml)   (Exception: Blood-tinged sputum.)  Answer Assessment - Initial Assessment Questions 1. ONSET: "When did the cough begin?"      Monday 2. SEVERITY: "How bad is the cough today?"      Bad  - coughing up blood 3. SPUTUM: "Describe the color of your sputum" (none, dry cough; clear, white, yellow, green)     Yellow with blood 4. HEMOPTYSIS: "Are you coughing up any blood?" If so ask: "How much?" (flecks, streaks, tablespoons, etc.)     yes 5. DIFFICULTY BREATHING: "Are you having difficulty breathing?" If Yes, ask: "How bad is it?" (e.g., mild, moderate, severe)    - MILD: No SOB at rest, mild SOB with walking, speaks normally in sentences, can lie down, no retractions, pulse < 100.    - MODERATE: SOB at rest, SOB with minimal exertion and prefers to sit, cannot lie down flat, speaks in phrases, mild retractions, audible wheezing, pulse 100-120.    - SEVERE: Very SOB at rest, speaks in single words, struggling to breathe, sitting hunched forward, retractions, pulse > 120      moderate 6. FEVER: "Do you have a fever?" If Yes, ask: "What is your temperature, how was it measured, and when did it start?"     no 7. CARDIAC HISTORY: "Do you have any history of heart disease?" (e.g., heart attack, congestive heart failure)      no 8. LUNG HISTORY: "Do you have any history of lung disease?"  (e.g., pulmonary  embolus, asthma, emphysema)     no 9. PE RISK FACTORS: "Do you have a history of blood clots?" (or: recent major surgery, recent prolonged travel, bedridden)     no 10. OTHER SYMPTOMS: "Do you have any other symptoms?" (e.g., runny nose, wheezing, chest pain)       no 11. PREGNANCY: "Is there any chance you are pregnant?" "When was your last menstrual period?"        12. TRAVEL: "Have you traveled out of the country in the last month?" (e.g., travel history, exposures)  Protocols used: Cough - Acute Productive-A-AH

## 2022-04-03 NOTE — Progress Notes (Signed)
I,April Miller,acting as a Education administrator for [employer], PA-C.,have documented all relevant documentation on the behalf of [name], PA-C,as directed by  [employer], PA-C while in the presence of [employer], PA-C.   Established patient visit   Patient: Alexis Lloyd   DOB: 1960-12-24   61 y.o. Female  MRN: [id number] Visit Date: 04/03/2022  Today's healthcare provider: [name], PA-C   Chief Complaint  Patient presents with   Facial Pain   Subjective    HPI  Patient has had fatigue and facial pressure. Also has symptoms of nasal drainage. No fever, no cough. Patient has not tested for covid. No has been taking Tylenol.  Medications: Outpatient Medications Prior to Visit  Medication Sig   albuterol (VENTOLIN HFA) 108 (90 Base) MCG/ACT inhaler Inhale 2 puffs into the lungs every 6 (six) hours as needed.   ascorbic Acid (VITAMIN C) 500 MG CPCR Take 500 mg by mouth daily.   aspirin EC 81 MG tablet Take 1 tablet (81 mg total) by mouth daily. Swallow whole.   Blood Glucose Monitoring Suppl (ONETOUCH VERIO) w/Device KIT To check blood sugar once daily   cetirizine (ZYRTEC) 10 MG tablet Take 1 tablet (10 mg total) by mouth daily.   Cholecalciferol (VITAMIN D3) 50 MCG (2000 UT) TABS Take 2,000 Units by mouth.   conjugated estrogens (PREMARIN) vaginal cream Place 1 Applicatorful vaginally daily.   Continuous Blood Gluc Receiver (FREESTYLE LIBRE 14 DAY READER) DEVI Use per package directions.   Cyanocobalamin (VITAMIN B-12) 3000 MCG SUBL Place 1 tablet under the tongue daily at 6 (six) AM. Gummies   dapagliflozin propanediol (FARXIGA) 10 MG TABS tablet Take 1 tablet (10 mg total) by mouth daily before breakfast. Patient receives via [employer] patient assistance   fluconazole (DIFLUCAN) 150 MG tablet Take for vaginal yeast, repeat dose in 4 days is symptoms remain.   glipiZIDE (GLUCOTROL) 10 MG tablet TAKE 1 TABLET BY MOUTH TWICE  DAILY BEFORE MEALS   glucose blood (ONETOUCH  VERIO) test strip To check blood sugar once daily   ibuprofen (ADVIL) 200 MG tablet Take 400 mg by mouth every 6 (six) hours as needed.   insulin degludec (TRESIBA FLEXTOUCH) 100 UNIT/ML FlexTouch Pen Inject 22 Units into the skin at bedtime.   Insulin Pen Needle (PEN NEEDLES) 32G X 6 MM MISC 1 each by Does not apply route at bedtime.   Lancets (ONETOUCH ULTRASOFT) lancets Use as instructed   lisinopril-hydrochlorothiazide (ZESTORETIC) 20-12.5 MG tablet Take 2 tablets by mouth once daily   loperamide (IMODIUM) 2 MG capsule Take 2 capsules (4 mg total) by mouth 4 (four) times daily as needed for diarrhea or loose stools.   montelukast (SINGULAIR) 10 MG tablet Take 1 tablet by mouth once daily   pantoprazole (PROTONIX) 40 MG tablet Take 1 tablet by mouth once daily   rosuvastatin (CRESTOR) 10 MG tablet Take 1 tablet by mouth once daily   scopolamine (TRANSDERM-SCOP) 1 MG/3DAYS Place 1 patch (1.5 mg total) onto the skin every 3 (three) days.   spironolactone (ALDACTONE) 25 MG tablet Take 1 tablet by mouth once daily   SUMAtriptan (IMITREX) 50 MG tablet Take 1 tablet (50 mg total) by mouth every 2 (two) hours as needed for migraine. No more than 256m in 24 hours   No facility-administered medications prior to visit.    Review of Systems  HENT:  Positive for congestion, postnasal drip, rhinorrhea, sinus pressure, sinus pain, sore throat and trouble swallowing.  Respiratory:  Positive for cough and shortness of breath.        Objective    BP (!) 104/57 (BP Location: Right Arm, Patient Position: Sitting, Cuff Size: Large)   Pulse 84   Temp 98.5 F (36.9 C) (Oral)   Resp 16   Wt 214 lb (97.1 kg)   SpO2 100%   BMI 43.22 kg/m    Physical Exam Vitals reviewed.  Constitutional:      General: She is not in acute distress.    Appearance: Normal appearance. She is well-developed. She is not diaphoretic.  HENT:     Head: Normocephalic and atraumatic.     Right Ear: Tympanic membrane, ear  canal and external ear normal.     Left Ear: There is impacted cerumen.     Ears:     Comments: Fluids behind the ears    Nose: Congestion and rhinorrhea present.  Eyes:     General: No scleral icterus.       Right eye: Discharge present.        Left eye: Discharge present.    Extraocular Movements: Extraocular movements intact.     Conjunctiva/sclera: Conjunctivae normal.     Pupils: Pupils are equal, round, and reactive to light.  Neck:     Thyroid: No thyromegaly.  Cardiovascular:     Rate and Rhythm: Normal rate and regular rhythm.     Pulses: Normal pulses.     Heart sounds: Normal heart sounds. No murmur heard. Pulmonary:     Effort: Pulmonary effort is normal. No respiratory distress.     Breath sounds: Normal breath sounds. No wheezing, rhonchi or rales.  Abdominal:     General: Abdomen is flat. Bowel sounds are normal.     Palpations: Abdomen is soft.  Musculoskeletal:        General: Normal range of motion.     Cervical back: Normal range of motion and neck supple.     Right lower leg: No edema.     Left lower leg: No edema.  Lymphadenopathy:     Cervical: No cervical adenopathy.  Skin:    General: Skin is warm and dry.     Findings: No rash.  Neurological:     Mental Status: She is alert and oriented to person, place, and time. Mental status is at baseline.  Psychiatric:        Mood and Affect: Mood normal.        Behavior: Behavior normal.        Thought Content: Thought content normal.        Judgment: Judgment normal.       No results found for any visits on 04/03/22.  Assessment & Plan     1. Hemoptysis Per pt - DG Chest 2 View; Future  2. Acute cough With slight wheezing - DG Chest 2 View; Future - albuterol (VENTOLIN HFA) 108 (90 Base) MCG/ACT inhaler; Inhale 2 puffs into the lungs every 6 (six) hours as needed.  Dispense: 18 g; Refill: 0 - azithromycin (ZITHROMAX) 250 MG tablet; Take 2 tablets on day 1, then 1 tablet daily on days 2 through 5   Dispense: 6 tablet; Refill: 0  3. SOB (shortness of breath) With some wheezing  - DG Chest 2 View; Future - albuterol (VENTOLIN HFA) 108 (90 Base) MCG/ACT inhaler; Inhale 2 puffs into the lungs every 6 (six) hours as needed.  Dispense: 18 g; Refill: 0  4. Hypotension due to drugs BP today was  104/57 Will need to decrease a dose of her BP medication to once daily Will need to be reassessed in 2 weeks  5. Uncontrolled type 2 diabetes mellitus with hyperglycemia, with long-term current use of insulin ([place]) Managed by her PCP and [name]  6. Mild intermittent asthma without complication With some wheezing on PE - DG Chest 2 View; Future - albuterol (VENTOLIN HFA) 108 (90 Base) MCG/ACT inhaler; Inhale 2 puffs into the lungs every 6 (six) hours as needed.  Dispense: 18 g; Refill: 0  7. Acute frontal sinusitis, recurrence not specified Advised to start with symptomatic treatment She cannot use nasal saline rinse. Per pt, developed a pneumonia in the past Advised to continue with antihistamines/montelukast and zyrtec x2 for two days Drink plenty of water, use air humidifier - DG Chest 2 View; Future - azithromycin (ZITHROMAX) 250 MG tablet; Take 2 tablets on day 1, then 1 tablet daily on days 2 through 5  Dispense: 6 tablet; Refill: 0 Patient was instructed not to fill it unless Her symptoms worsen   Discussed the potential negative side effects of antibiotics and that they can lead to resistance if used unnecessarily Discussed expectations for duration of symptoms. Discussed that they may feel bad for up to a week, but sometimes symptoms last longer (especially if you smoke)  Explained that you can get multiple viruses in a row and recommend prevention strategies such as hand washing  FU in 2 weeks for BP  The patient was advised to call back or seek an in-person evaluation if the symptoms worsen or if the condition fails to improve as anticipated.  I discussed the assessment and  treatment plan with the patient. The patient was provided an opportunity to ask questions and all were answered. The patient agreed with the plan and demonstrated an understanding of the instructions.  The entirety of the information documented in the History of Present Illness, Review of Systems and Physical Exam were personally obtained by me. Portions of this information were initially documented by the CMA and reviewed by me for thoroughness and accuracy.  Portions of this note were created using dictation software and may contain typographical errors.       Total encounter time more than 40 minutes  Greater than 50% was spent in counseling and coordination of care with the patient  Elberta Leatherwood  [facility] [phone] (phone) [phone] (fax)  [place]

## 2022-04-05 ENCOUNTER — Other Ambulatory Visit: Payer: Self-pay | Admitting: Family Medicine

## 2022-04-05 DIAGNOSIS — I10 Essential (primary) hypertension: Secondary | ICD-10-CM

## 2022-04-07 ENCOUNTER — Ambulatory Visit: Payer: Self-pay

## 2022-04-07 NOTE — Telephone Encounter (Signed)
Patient checking on status of bp medicine,  lisinopril-hydrochlorothiazide (ZESTORETIC) 20-12.5 MG . She is out of it and says she called pharmacy and no one is answerling,. I let her know its still pending. [facility] ([address]), [place] - [address] Phone:  [phone]  Fax:  [phone]

## 2022-04-07 NOTE — Telephone Encounter (Signed)
Requested Prescriptions  Pending Prescriptions Disp Refills  . lisinopril-hydrochlorothiazide (ZESTORETIC) 20-12.5 MG tablet [Pharmacy Med Name: Lisinopril-hydroCHLOROthiazide 20-12.5 MG Oral Tablet] 180 tablet 0    Sig: Take 2 tablets by mouth once daily     Cardiovascular:  ACEI + Diuretic Combos Failed - 04/07/2022  9:42 AM      Failed - Cr in normal range and within 180 days    Creatinine  Date Value Ref Range Status  04/14/2014 0.75 0.60 - 1.30 mg/dL Final   Creatinine, Ser  Date Value Ref Range Status  12/11/2021 1.01 (H) 0.57 - 1.00 mg/dL Final         Passed - Na in normal range and within 180 days    Sodium  Date Value Ref Range Status  12/11/2021 137 134 - 144 mmol/L Final  04/14/2014 141 136 - 145 mmol/L Final         Passed - K in normal range and within 180 days    Potassium  Date Value Ref Range Status  12/11/2021 4.2 3.5 - 5.2 mmol/L Final  04/14/2014 4.0 3.5 - 5.1 mmol/L Final         Passed - eGFR is 30 or above and within 180 days    EGFR (African American)  Date Value Ref Range Status  04/14/2014 >60 >92m/min Final  11/02/2013 >60  Final   GFR calc Af Amer  Date Value Ref Range Status  08/01/2020 77 >59 mL/min/1.73 Final    Comment:    **In accordance with recommendations from the NKF-ASN Task force,**   [facility] is in the process of updating its eGFR calculation to the   2021 CKD-EPI creatinine equation that estimates kidney function   without a race variable.    EGFR (Non-African Amer.)  Date Value Ref Range Status  04/14/2014 >60 >642mmin Final    Comment:    eGFR values <6037min/1.73 m2 may be an indication of chronic kidney disease (CKD). Calculated eGFR, using the MRDR Study equation, is useful in  patients with stable renal function. The eGFR calculation will not be reliable in acutely ill patients when serum creatinine is changing rapidly. It is not useful in patients on dialysis. The eGFR calculation may not be applicable to  patients at the low and high extremes of body sizes, pregnant women, and vegetarians.   11/02/2013 >60  Final    Comment:    eGFR values <58m6mn/1.73 m2 may be an indication of chronic kidney disease (CKD). Calculated eGFR is useful in patients with stable renal function. The eGFR calculation will not be reliable in acutely ill patients when serum creatinine is changing rapidly. It is not useful in  patients on dialysis. The eGFR calculation may not be applicable to patients at the low and high extremes of body sizes, pregnant women, and vegetarians.    GFR, Estimated  Date Value Ref Range Status  06/15/2020 >60 >60 mL/min Final    Comment:    (NOTE) Calculated using the CKD-EPI Creatinine Equation (2021)    GFR calc non Af Amer  Date Value Ref Range Status  08/01/2020 67 >59 mL/min/1.73 Final   eGFR  Date Value Ref Range Status  12/11/2021 63 >59 mL/min/1.73 Final         Passed - Patient is not pregnant      Passed - Last BP in normal range    BP Readings from Last 1 Encounters:  04/03/22 (!) 104/57         Passed - Valid encounter  within last 6 months    Recent Outpatient Visits          4 days ago Hemoptysis   [facility], [place], PA-C   3 months ago Uncontrolled type 2 diabetes mellitus with hyperglycemia, with long-term current use of insulin [facility])   [facility] Myles Gip, DO   4 months ago Type 2 diabetes mellitus with hyperglycemia, without long-term current use of insulin [facility])   [facility] [name] T, FNP   5 months ago Acute cystitis with hematuria   [facility] Gwyneth Sprout, FNP   7 months ago Urinary frequency   [facility] Gwyneth Sprout, FNP      Future Appointments            In 3 days Gwyneth Sprout, [place], [place]

## 2022-04-07 NOTE — Telephone Encounter (Signed)
Message from Bayard Beaver sent at 04/07/2022  9:38 AM EDT  Summary: med ?   Patient called in wanting to speak with nurse about her bp med, lisinopril. She was taking 2 and was told to only take 1 because her bp was low. Please call back to discuss          Chief Complaint: Pt with questions about her medications/would like to discuss all medications and medication reconciliation  Symptoms: no was lisinopril- HCTZ was decreased to 1 pill. Med list incorrect.  Frequency: n/a Pertinent Negatives: Patient denies h/o low BP at office visit- pt does not have BP monitor Disposition: '[]'$ ED /'[]'$ Urgent Care (no appt availability in office) / '[x]'$ Appointment(In office/virtual)/ '[]'$  [place] [facility]/ '[]'$ Home Care/ '[]'$ Refused Recommended Disposition /'[]'$ [place] [facility]/ '[]'$  Follow-up with PCP Additional Notes: pt requested appt sooner than 2 weeks.   Reason for Disposition  [1] Caller has NON-URGENT medicine question about med that PCP prescribed AND [2] triager unable to answer question    Pt requesting appt.  Answer Assessment - Initial Assessment Questions 1. NAME of MEDICINE: "What medicine(s) are you calling about?"     Bp medication and would like to discuss all medications 2. QUESTION: "What is your question?" (e.g., double dose of medicine, side effect)     Does she still need to be on some of her medications and review BP meds and med reconciliation 3. PRESCRIBER: "Who prescribed the medicine?" Reason: if prescribed by specialist, call should be referred to that group.     PCP 4. SYMPTOMS: "Do you have any symptoms?" If Yes, ask: "What symptoms are you having?"  "How bad are the symptoms (e.g., mild, moderate, severe)     no 5. PREGNANCY:  "Is there any chance that you are pregnant?" "When was your last menstrual period?"     N/A  Protocols used: Medication Question Call-A-AH

## 2022-04-10 ENCOUNTER — Ambulatory Visit (INDEPENDENT_AMBULATORY_CARE_PROVIDER_SITE_OTHER): Payer: Medicare Other | Admitting: Family Medicine

## 2022-04-10 ENCOUNTER — Encounter: Payer: Self-pay | Admitting: Family Medicine

## 2022-04-10 VITALS — BP 127/71 | HR 92 | Temp 98.7°F | Resp 16 | Ht 59.0 in | Wt 213.0 lb

## 2022-04-10 DIAGNOSIS — Z794 Long term (current) use of insulin: Secondary | ICD-10-CM

## 2022-04-10 DIAGNOSIS — E1165 Type 2 diabetes mellitus with hyperglycemia: Secondary | ICD-10-CM | POA: Diagnosis not present

## 2022-04-10 DIAGNOSIS — I152 Hypertension secondary to endocrine disorders: Secondary | ICD-10-CM | POA: Diagnosis not present

## 2022-04-10 DIAGNOSIS — E1159 Type 2 diabetes mellitus with other circulatory complications: Secondary | ICD-10-CM

## 2022-04-10 LAB — POCT GLYCOSYLATED HEMOGLOBIN (HGB A1C)
Est. average glucose Bld gHb Est-mCnc: 226
Hemoglobin A1C: 9.5 % — AB (ref 4.0–5.6)

## 2022-04-10 NOTE — Assessment & Plan Note (Signed)
Chronic, improved Continue to work with CCM pharmacist and endocrinology Continue to recommend balanced, lower carb meals. Smaller meal size, adding snacks. Choosing water as drink of choice and increasing purposeful exercise. Goal of <8%; better to be lower

## 2022-04-10 NOTE — Progress Notes (Signed)
Established patient visit   Patient: Alexis Lloyd   DOB: 09-Jul-1960   61 y.o. Female  MRN: [id number] Visit Date: 04/10/2022  Today's healthcare provider: Gwyneth Sprout, FNP  Re Introduced to nurse practitioner role and practice setting.  All questions answered.  Discussed provider/patient relationship and expectations.  I,Tiffany J Bragg,acting as a scribe for Gwyneth Sprout, FNP.,have documented all relevant documentation on the behalf of Gwyneth Sprout, FNP,as directed by  Gwyneth Sprout, FNP while in the presence of Gwyneth Sprout, FNP.   Chief Complaint  Patient presents with   Hypertension   Diabetes   Hyperlipidemia   Subjective    HPI  Diabetes Mellitus Type II, follow-up  Lab Results  Component Value Date   HGBA1C 9.5 (A) 04/10/2022   HGBA1C 11.6 (A) 11/29/2021   HGBA1C 11.7 (H) 08/30/2021   Last seen for diabetes 4 months ago.  Management since then includes continuing the same treatment. She reports excellent compliance with treatment. She is not having side effects.   Home blood sugar records: fasting range: 154  Episodes of hypoglycemia? No    Current insulin regiment: tresiba 24 units at bedtime Most Recent Eye Exam: October 2023  --------------------------------------------------------------------------------------------------- Hypertension, follow-up  BP Readings from Last 3 Encounters:  04/10/22 127/71  04/03/22 (!) 104/57  03/17/22 110/60   Wt Readings from Last 3 Encounters:  04/10/22 213 lb (96.6 kg)  04/03/22 214 lb (97.1 kg)  03/17/22 212 lb 12.8 oz (96.5 kg)     She was last seen for hypertension 4 months ago.  BP at that visit was 124/72. Management since that visit includes take half dose of lisinopril. She reports excellent compliance with treatment. She is not having side effects.  She is exercising. She is not adherent to low salt diet.   Outside blood pressures are not checked.  She does not smoke.  Use of agents  associated with hypertension: none.   --------------------------------------------------------------------------------------------------- Lipid/Cholesterol, follow-up  Last Lipid Panel: Lab Results  Component Value Date   CHOL 146 11/29/2021   LDLCALC 85 11/29/2021   HDL 25 (L) 11/29/2021   TRIG 212 (H) 11/29/2021    She was last seen for this 4 months ago.  Management since that visit includes continue medications.  She reports excellent compliance with treatment. She is not having side effects.   Symptoms: No appetite changes No foot ulcerations  No chest pain No chest pressure/discomfort  No dyspnea No orthopnea  No fatigue No lower extremity edema  No palpitations No paroxysmal nocturnal dyspnea  No nausea No numbness or tingling of extremity  No polydipsia Yes polyuria  No speech difficulty No syncope   She is following a Regular diet. Current exercise: housecleaning  Last metabolic panel Lab Results  Component Value Date   GLUCOSE 174 (H) 12/11/2021   NA 137 12/11/2021   K 4.2 12/11/2021   BUN 23 12/11/2021   CREATININE 1.01 (H) 12/11/2021   EGFR 63 12/11/2021   GFRNONAA 67 08/01/2020   CALCIUM 10.2 12/11/2021   AST 6 05/17/2021   ALT 14 05/17/2021   The 10-year ASCVD risk score (Arnett DK, et al., 2019) is: 18.2%  ---------------------------------------------------------------------------------------------------   Medications: Outpatient Medications Prior to Visit  Medication Sig   albuterol (VENTOLIN HFA) 108 (90 Base) MCG/ACT inhaler Inhale 2 puffs into the lungs every 6 (six) hours as needed.   ascorbic Acid (VITAMIN C) 500 MG CPCR Take 500 mg by mouth daily.  aspirin EC 81 MG tablet Take 1 tablet (81 mg total) by mouth daily. Swallow whole.   Blood Glucose Monitoring Suppl (ONETOUCH VERIO) w/Device KIT To check blood sugar once daily   cetirizine (ZYRTEC) 10 MG tablet Take 1 tablet (10 mg total) by mouth daily.   Cholecalciferol (VITAMIN D3) 50  MCG (2000 UT) TABS Take 2,000 Units by mouth.   conjugated estrogens (PREMARIN) vaginal cream Place 1 Applicatorful vaginally daily.   Continuous Blood Gluc Receiver (FREESTYLE LIBRE 14 DAY READER) DEVI Use per package directions.   Cyanocobalamin (VITAMIN B-12) 3000 MCG SUBL Place 1 tablet under the tongue daily at 6 (six) AM. Gummies   dapagliflozin propanediol (FARXIGA) 10 MG TABS tablet Take 1 tablet (10 mg total) by mouth daily before breakfast. Patient receives via [employer] patient assistance   fluconazole (DIFLUCAN) 150 MG tablet Take for vaginal yeast, repeat dose in 4 days is symptoms remain.   glipiZIDE (GLUCOTROL) 10 MG tablet TAKE 1 TABLET BY MOUTH TWICE  DAILY BEFORE MEALS   glucose blood (ONETOUCH VERIO) test strip To check blood sugar once daily   ibuprofen (ADVIL) 200 MG tablet Take 400 mg by mouth every 6 (six) hours as needed.   insulin degludec (TRESIBA FLEXTOUCH) 100 UNIT/ML FlexTouch Pen Inject 22 Units into the skin at bedtime.   Insulin Pen Needle (PEN NEEDLES) 32G X 6 MM MISC 1 each by Does not apply route at bedtime.   Lancets (ONETOUCH ULTRASOFT) lancets Use as instructed   lisinopril-hydrochlorothiazide (ZESTORETIC) 20-12.5 MG tablet Take 2 tablets by mouth once daily   loperamide (IMODIUM) 2 MG capsule Take 2 capsules (4 mg total) by mouth 4 (four) times daily as needed for diarrhea or loose stools.   montelukast (SINGULAIR) 10 MG tablet Take 1 tablet by mouth once daily   Multiple Vitamins-Minerals (PRESERVISION AREDS 2) CAPS Take 1 capsule by mouth daily.   pantoprazole (PROTONIX) 40 MG tablet Take 1 tablet by mouth once daily   rosuvastatin (CRESTOR) 10 MG tablet Take 1 tablet by mouth once daily   scopolamine (TRANSDERM-SCOP) 1 MG/3DAYS Place 1 patch (1.5 mg total) onto the skin every 3 (three) days.   spironolactone (ALDACTONE) 25 MG tablet Take 1 tablet by mouth once daily   SUMAtriptan (IMITREX) 50 MG tablet Take 1 tablet (50 mg total) by mouth every 2 (two) hours  as needed for migraine. No more than 245m in 24 hours   No facility-administered medications prior to visit.    Review of Systems    Objective    BP 127/71 (BP Location: Right Arm, Patient Position: Sitting, Cuff Size: Large)   Pulse 92   Temp 98.7 F (37.1 C) (Oral)   Resp 16   Ht _0  (1.499 m)   Wt 213 lb (96.6 kg)   SpO2 99%   BMI 43.02 kg/m   Physical Exam Vitals and nursing note reviewed.  Constitutional:      General: She is not in acute distress.    Appearance: Normal appearance. She is obese. She is not ill-appearing, toxic-appearing or diaphoretic.  HENT:     Head: Normocephalic and atraumatic.  Cardiovascular:     Rate and Rhythm: Normal rate and regular rhythm.     Pulses: Normal pulses.     Heart sounds: Normal heart sounds. No murmur heard.    No friction rub. No gallop.  Pulmonary:     Effort: Pulmonary effort is normal. No respiratory distress.     Breath sounds: Normal breath sounds. No  stridor. No wheezing, rhonchi or rales.  Chest:     Chest wall: No tenderness.  Abdominal:     General: Bowel sounds are normal.     Palpations: Abdomen is soft.  Musculoskeletal:        General: No swelling, tenderness, deformity or signs of injury. Normal range of motion.     Right lower leg: No edema.     Left lower leg: No edema.  Skin:    General: Skin is warm and dry.     Capillary Refill: Capillary refill takes less than 2 seconds.     Coloration: Skin is not jaundiced or pale.     Findings: No bruising, erythema, lesion or rash.  Neurological:     General: No focal deficit present.     Mental Status: She is alert and oriented to person, place, and time. Mental status is at baseline.     Cranial Nerves: No cranial nerve deficit.     Sensory: No sensory deficit.     Motor: No weakness.     Coordination: Coordination normal.  Psychiatric:        Mood and Affect: Mood normal.        Behavior: Behavior normal.        Thought Content: Thought content  normal.        Judgment: Judgment normal.     Results for orders placed or performed in visit on 04/10/22  POCT glycosylated hemoglobin (Hb A1C)  Result Value Ref Range   Hemoglobin A1C 9.5 (A) 4.0 - 5.6 %   Est. average glucose Bld gHb Est-mCnc 226     Assessment & Plan     Problem List Items Addressed This Visit       Cardiovascular and Mediastinum   Hypertension associated with diabetes ([place])    Chronic, improved with one tablet of lisinopri-hctz (20-25) in place of 2 tabs Discussed goal of <130/<80 Continue home monitoring         Endocrine   Uncontrolled type 2 diabetes mellitus with hyperglycemia, with long-term current use of insulin ([facility]) - Primary    Chronic, improved Continue to work with CCM pharmacist and endocrinology Continue to recommend balanced, lower carb meals. Smaller meal size, adding snacks. Choosing water as drink of choice and increasing purposeful exercise. Goal of <8%; better to be lower      Relevant Orders   POCT glycosylated hemoglobin (Hb A1C) (Completed)   Return in about 3 months (around 07/11/2022) for chonic disease management.     Vonna Kotyk, FNP, have reviewed all documentation for this visit. The documentation on 04/10/22 for the exam, diagnosis, procedures, and orders are all accurate and complete.  Gwyneth Sprout, [place] [phone] (phone) [phone] (fax)  [place]

## 2022-04-10 NOTE — Assessment & Plan Note (Signed)
Chronic, improved with one tablet of lisinopri-hctz (20-25) in place of 2 tabs Discussed goal of <130/<80 Continue home monitoring

## 2022-04-15 ENCOUNTER — Telehealth: Payer: Self-pay

## 2022-04-15 NOTE — Progress Notes (Addendum)
I reach out t [employer] to check patient application for [employer].   Per [employer] did not receive her application. Patient application needs to be refax to [phone] which is the emergency line, and it takes about two-three days for  them to process it.Notified Clinical pharmacist.  04/18/2022    I reach out to [employer] to check to see if they received patient application for Tresiba,Ozempic.re enrollment, and if it has been process.   After being on hold for 2 hours and 12 minutes:   Per [employer] still  did not receive her application. Patient application will need to be refax to [phone] which is the emergency line, and it takes about a 2-3 days for them to process it.Some faxes are not going through due to so many going to them.Per representative, best fax to fax PAP to is the emergency line as provided because this one has a faster response even though I inform her we already did this.Per representative,   it is technical difficulties which is  out of there hands unfortunately. Per Representative she will reach out to me in one week to update me if they have receive the fax. Notified Clinical pharmacist.   04/24/2022   I  reach out to [employer] to check to see if they received patient application for Tresiba,Ozempic.re enrollment, and if it has been process.  After being on hold for 2 hours and 30 minutes:  Per [employer],they still  did not receive her application. Patient application will need to be refax to [phone] which is the emergency line, and it takes about a 2-3 days for them to process it.Or it can be fax to [phone], and can be mailed. Notified Clinical pharmacist.   Anderson Malta Clinical Pharmacist Assistant [phone]

## 2022-04-17 ENCOUNTER — Other Ambulatory Visit: Payer: Self-pay | Admitting: Family Medicine

## 2022-04-17 DIAGNOSIS — K219 Gastro-esophageal reflux disease without esophagitis: Secondary | ICD-10-CM

## 2022-04-17 MED ORDER — PANTOPRAZOLE SODIUM 40 MG PO TBEC: 40.0000 mg | DELAYED_RELEASE_TABLET | Freq: Every day | ORAL | 0 refills | Status: DC

## 2022-04-17 NOTE — Telephone Encounter (Signed)
Requested Prescriptions  Pending Prescriptions Disp Refills   pantoprazole (PROTONIX) 40 MG tablet 90 tablet 0    Sig: Take 1 tablet (40 mg total) by mouth daily.     Gastroenterology: Proton Pump Inhibitors Passed - 04/17/2022  8:44 AM      Passed - Valid encounter within last 12 months    Recent Outpatient Visits           1 week ago Uncontrolled type 2 diabetes mellitus with hyperglycemia, with long-term current use of insulin [facility])   [facility] [name] T, FNP   2 weeks ago Hemoptysis   [facility] [place], Essig, PA-C   4 months ago Uncontrolled type 2 diabetes mellitus with hyperglycemia, with long-term current use of insulin [facility])   [place], Maguayo, DO   4 months ago Type 2 diabetes mellitus with hyperglycemia, without long-term current use of insulin [facility])   [facility] [name] T, FNP   6 months ago Acute cystitis with hematuria   [facility] Gwyneth Sprout, FNP

## 2022-04-17 NOTE — Telephone Encounter (Signed)
Medication Refill - Medication: pantoprazole (PROTONIX) 40 MG tablet   Has the patient contacted their pharmacy? No. (Agent: If no, request that the patient contact the pharmacy for the refill. If patient does not wish to contact the pharmacy document the reason why and proceed with request.) (Agent: If yes, when and what did the pharmacy advise?)  Preferred Pharmacy (with phone number or street name):  [facility] [place] - [address] Phone: [phone]  Fax: [phone]     Has the patient been seen for an appointment in the last year OR does the patient have an upcoming appointment? Yes.    Agent: Please be advised that RX refills may take up to 3 business days. We ask that you follow-up with your pharmacy.

## 2022-04-30 ENCOUNTER — Encounter: Payer: Medicare Other | Admitting: *Deleted

## 2022-05-05 ENCOUNTER — Ambulatory Visit: Payer: Self-pay | Admitting: *Deleted

## 2022-05-05 NOTE — Patient Outreach (Signed)
  Care Coordination   Follow Up Visit Note   05/05/2022 Name: Alexis Lloyd MRN: [id number] DOB: 16-Jan-1961  IMBERLY Lloyd is a 61 y.o. year old female who sees Gwyneth Sprout, FNP for primary care. I spoke with  Alexis Lloyd by phone today.  What matters to the patients health and wellness today?  Ongoing management of DM.      Goals Addressed             This Visit's Progress    RNCM: Effective Management of DM   On track    Care Coordination Interventions:  Lab Results  Component Value Date   HGBA1C 9.5 (A) 04/10/2022    Provided education to Lloyd about basic DM disease process Reviewed medications with Lloyd and discussed importance of medication adherence Counseled on importance of regular laboratory monitoring as prescribed Discussed plans with Lloyd for ongoing care management follow up and provided Lloyd with direct contact information for care management team Provided Lloyd with written educational materials related to hypo and hyperglycemia and importance of correct treatment Reviewed scheduled/upcoming provider appointments including: 05-29-2022 at 1020 am for AWV.  Will also see endocrinology in December for repeat A1C Advised Lloyd, providing education and rationale, to check cbg BID  and record, calling pcp or endocrinologist  for findings outside established parameters. The Lloyd states this am was 169. Education on the goal of fasting of <130 and post prandial of <180. Education and support given Review of Lloyd status, including review of consultants reports, relevant laboratory and other test results, and medications completed The Lloyd agrees to the care coordination program and working with the [facility] for help in managing DM and other chronic conditions. The Lloyd knows how to reach the [facility] and knows the [facility] will reach out on a regular basis.  Reviewed decrease of A1C, discussed plan to continue decreasing (diet change, exercise,  increasing water intake)           SDOH assessments and interventions completed:  No     Care Coordination Interventions:  Yes, provided   Follow up plan: Follow up call scheduled for 12/27    Encounter Outcome:  Pt. Visit Completed   [name], RN, MSN, [place] Care Management Care Management Coordinator [phone]

## 2022-05-06 ENCOUNTER — Other Ambulatory Visit: Payer: Self-pay | Admitting: Family Medicine

## 2022-05-06 DIAGNOSIS — J40 Bronchitis, not specified as acute or chronic: Secondary | ICD-10-CM

## 2022-05-09 ENCOUNTER — Telehealth: Payer: Self-pay

## 2022-05-09 NOTE — Progress Notes (Signed)
05/12/2022: Per Clinical pharmacist, can you ask her the following information:  Total household income: Number of people in household: Number of independents:   I need it for her Novo PAP.   Patient states she turn in her household income , and the number of people in the household is 2 her and her husband. For independents patient states she does not have any kids, and its just her and her husband "maybe 2 if you have to count me and my husband if not the 0".   Patient is asking if Darcel Bayley is better or the same as Ozempic, and if it would  be easier to get then ozempic? She is asking if  Darcel Bayley is cheaper should she do Mounjaro instead?.  She states she had to decrease her Tresiba down to 24 units instead of 26 units because she "couldn't handle it and felt weak". Patient states her provider is aware of the change. Patient inform me when she administer her Tyler Aas she does not use alcohol swabs as a RN inform her not to.Patient reports when she does administer her Tyler Aas to her left side she will have a stinging sensation that will linger for a few minutes.Patient is asking if this is normal. Notified Clinical pharmacist.  05/13/2022: Per Clinical pharmacist, Refaxed her PAP today. You can let her know that there is no patient assistance for [facility] and I would recommend she continue with ozempic. She should ensure she is properly cleaning the injection site prior to administration and alternating where she injects each time.   Patient verbalized understanding and ask if there any where else on her body she can administer her Tyler Aas besides her stomach. Notified Clinical pharmacist.  Per Clinical pharmacist, The stomach is the best site to inject, but she can try the back of her upper arm or her thighs if she would like.   Patient verbalized understanding.  [place] Pharmacist Assistant [phone]

## 2022-05-19 ENCOUNTER — Telehealth: Payer: Self-pay

## 2022-05-19 NOTE — Progress Notes (Signed)
Per Clinical pharmacist,  [facility] received an shipment from [employer] for "[employer]." I am not sure who this patient is, I think it might be a mistake. Can you call [employer] and check if this for Serine Godfrey perhaps? The Customer [phone] and Shipment # [id number].  I reach out to [employer] regarding the above and to check on the renewal status.  Per [employer], the shipment that [facility] receive is not her shipment.That shipment is for another patient which is located in [place] but has [facility] address as the provider. She going to send a return label so we can send it back. The Office should get the return label in 1 week. Patient is not due for another  shipment until 06/06/2022.   Per [employer] also have not received the missing information they need regarding the  Total household income: Number of people in household: Number of independents:   I inform her that we did refax that missing information.Per [employer], she going to send a message to her supervisor to see what's going with these fax.She recommends to refax this information to the emergency line [phone], and to give 10 days to process due to the high volume of faxes coming through.  Notified Clinical pharmacist.   Anderson Malta Clinical Pharmacist Assistant [phone]

## 2022-05-23 ENCOUNTER — Other Ambulatory Visit: Payer: Self-pay | Admitting: Family Medicine

## 2022-05-26 ENCOUNTER — Ambulatory Visit: Payer: Self-pay

## 2022-05-26 ENCOUNTER — Ambulatory Visit (INDEPENDENT_AMBULATORY_CARE_PROVIDER_SITE_OTHER): Payer: Medicare Other | Admitting: Physician Assistant

## 2022-05-26 ENCOUNTER — Encounter: Payer: Self-pay | Admitting: Physician Assistant

## 2022-05-26 VITALS — BP 124/64 | HR 90 | Temp 98.4°F | Ht 59.5 in | Wt 216.8 lb

## 2022-05-26 DIAGNOSIS — R051 Acute cough: Secondary | ICD-10-CM

## 2022-05-26 DIAGNOSIS — J029 Acute pharyngitis, unspecified: Secondary | ICD-10-CM | POA: Diagnosis not present

## 2022-05-26 DIAGNOSIS — R042 Hemoptysis: Secondary | ICD-10-CM | POA: Diagnosis not present

## 2022-05-26 LAB — POCT INFLUENZA A/B
Influenza A, POC: NEGATIVE
Influenza B, POC: NEGATIVE

## 2022-05-26 LAB — POC COVID19 BINAXNOW: SARS Coronavirus 2 Ag: NEGATIVE

## 2022-05-26 MED ORDER — LEVOCETIRIZINE DIHYDROCHLORIDE 5 MG PO TABS: 5.0000 mg | ORAL_TABLET | Freq: Every evening | ORAL | 0 refills | Status: DC

## 2022-05-26 NOTE — Progress Notes (Signed)
Alexis Lloyd,[name],acting as a Education administrator for [employer], PA-C.,have documented all relevant documentation on the behalf of Alexis Speak, PA-C,as directed by  [employer], PA-C while in the presence of [employer], PA-C.   Established patient visit   Patient: Alexis Lloyd   DOB: 13-Jul-1960   61 y.o. Female  MRN: [id number] Visit Date: 05/26/2022  Today's healthcare provider: [name], PA-C   No chief complaint on file.  Subjective    Cough This is a new problem. The current episode started in the past 7 days (Saturday). The problem has been gradually worsening. The problem occurs constantly. The cough is Productive of bloody sputum. Associated symptoms include a sore throat and shortness of breath. Associated symptoms comments: Ear itching (right). The symptoms are aggravated by fumes. Treatments tried: mucinex, robitssuin, tylenol. The treatment provided mild relief. Her past medical history is significant for asthma.    Patient reports she has not come in contact with anyone else sick. Patient has not tested for covid. Patient consented to covid and flu testing  Medications: Outpatient Medications Prior to Visit  Medication Sig   albuterol (VENTOLIN HFA) 108 (90 Base) MCG/ACT inhaler Inhale 2 puffs into the lungs every 6 (six) hours as needed.   ascorbic Acid (VITAMIN C) 500 MG CPCR Take 500 mg by mouth daily.   aspirin EC 81 MG tablet Take 1 tablet (81 mg total) by mouth daily. Swallow whole.   Blood Glucose Monitoring Suppl (ONETOUCH VERIO) w/Device KIT To check blood sugar once daily   cetirizine (ZYRTEC) 10 MG tablet Take 1 tablet (10 mg total) by mouth daily.   Cholecalciferol (VITAMIN D3) 50 MCG (2000 UT) TABS Take 2,000 Units by mouth.   conjugated estrogens (PREMARIN) vaginal cream Place 1 Applicatorful vaginally daily.   Continuous Blood Gluc Receiver (FREESTYLE LIBRE 14 DAY READER) DEVI Use per package directions.   Cyanocobalamin (VITAMIN B-12) 3000 MCG  SUBL Place 1 tablet under the tongue daily at 6 (six) AM. Gummies   dapagliflozin propanediol (FARXIGA) 10 MG TABS tablet Take 1 tablet (10 mg total) by mouth daily before breakfast. Patient receives via [employer] patient assistance   fluconazole (DIFLUCAN) 150 MG tablet Take for vaginal yeast, repeat dose in 4 days is symptoms remain.   glipiZIDE (GLUCOTROL) 10 MG tablet TAKE 1 TABLET BY MOUTH TWICE  DAILY BEFORE MEALS   glucose blood (ONETOUCH VERIO) test strip To check blood sugar once daily   ibuprofen (ADVIL) 200 MG tablet Take 400 mg by mouth every 6 (six) hours as needed.   insulin degludec (TRESIBA FLEXTOUCH) 100 UNIT/ML FlexTouch Pen Inject 22 Units into the skin at bedtime.   Insulin Pen Needle (PEN NEEDLES) 32G X 6 MM MISC 1 each by Does not apply route at bedtime.   Lancets (ONETOUCH ULTRASOFT) lancets Use as instructed   lisinopril-hydrochlorothiazide (ZESTORETIC) 20-12.5 MG tablet Take 2 tablets by mouth once daily   loperamide (IMODIUM) 2 MG capsule Take 2 capsules (4 mg total) by mouth 4 (four) times daily as needed for diarrhea or loose stools.   montelukast (SINGULAIR) 10 MG tablet Take 1 tablet by mouth once daily   Multiple Vitamins-Minerals (PRESERVISION AREDS 2) CAPS Take 1 capsule by mouth daily.   pantoprazole (PROTONIX) 40 MG tablet Take 1 tablet (40 mg total) by mouth daily.   rosuvastatin (CRESTOR) 10 MG tablet Take 1 tablet by mouth once daily   scopolamine (TRANSDERM-SCOP) 1 MG/3DAYS Place 1 patch (1.5 mg total) onto the skin every  3 (three) days.   spironolactone (ALDACTONE) 25 MG tablet Take 1 tablet by mouth once daily   SUMAtriptan (IMITREX) 50 MG tablet Take 1 tablet (50 mg total) by mouth every 2 (two) hours as needed for migraine. No more than 267m in 24 hours   No facility-administered medications prior to visit.    Review of Systems  HENT:  Positive for sore throat.   Respiratory:  Positive for cough and shortness of breath.        Objective    There  were no vitals taken for this visit.   Physical Exam Vitals reviewed.  Constitutional:      General: She is not in acute distress.    Appearance: Normal appearance. She is well-developed. She is not diaphoretic.  HENT:     Head: Normocephalic and atraumatic.  Eyes:     General: No scleral icterus.       Right eye: Discharge present.        Left eye: Discharge present.    Extraocular Movements: Extraocular movements intact.     Conjunctiva/sclera: Conjunctivae normal.     Pupils: Pupils are equal, round, and reactive to light.  Neck:     Thyroid: No thyromegaly.  Cardiovascular:     Rate and Rhythm: Normal rate and regular rhythm.     Pulses: Normal pulses.     Heart sounds: Normal heart sounds. No murmur heard. Pulmonary:     Effort: Pulmonary effort is normal. No respiratory distress.     Breath sounds: Normal breath sounds. No wheezing, rhonchi or rales.  Musculoskeletal:     Cervical back: Neck supple.     Right lower leg: No edema.     Left lower leg: No edema.  Lymphadenopathy:     Cervical: No cervical adenopathy.  Skin:    General: Skin is warm and dry.     Findings: No rash.  Neurological:     Mental Status: She is alert and oriented to person, place, and time. Mental status is at baseline.  Psychiatric:        Behavior: Behavior normal.        Thought Content: Thought content normal.        Judgment: Judgment normal.      No results found for any visits on 05/26/22.  Assessment & Plan     1. Acute cough 2. Sore throat Most likely due to allergic rhinosinusitis vs viral rhinosinusitis Advised symptomatic treatment  Increase fluids.  Rest.  Saline nasal spray. - pt cannot use.  Mucinex as directed.  Humidifier in bedroom. Flonase per orders- pt cannot use. Believes she developed pneumonia after using Flonase in the past. - POCT Influenza A/B negative - POC COVID-19 negative - levocetirizine (XYZAL) 5 MG tablet; Take 1 tablet (5 mg total) by mouth every  evening.  Dispense: 30 tablet; Refill:  Will change Zyrtec to Xyzal as she has been taking Zyrtec for many years.  Will Fu PRN The patient was advised to call back or seek an in-person evaluation if the symptoms worsen or if the condition fails to improve as anticipated.  Alexis Lloyd discussed the assessment and treatment plan with the patient. The patient was provided an opportunity to ask questions and all were answered. The patient agreed with the plan and demonstrated an understanding of the instructions.  The entirety of the information documented in the History of Present Illness, Review of Systems and Physical Exam were personally obtained by me. Portions of this information were  initially documented by the CMA and reviewed by me for thoroughness and accuracy.  Alexis Lloyd, [facility], [place] [phone] (phone) [phone] (fax)

## 2022-05-26 NOTE — Telephone Encounter (Signed)
    Chief Complaint: Cough, sore throat, ear pain Symptoms: Above Frequency: Saturday Pertinent Negatives: Patient denies SOB Disposition: '[]'$ ED /'[]'$ Urgent Care (no appt availability in office) / '[x]'$ Appointment(In office/virtual)/ '[]'$  [place] [facility]/ '[]'$ Home Care/ '[]'$ Refused Recommended Disposition /'[]'$ [place] [facility]/ '[]'$  Follow-up with PCP Additional Notes:   Reason for Disposition  Earache  Answer Assessment - Initial Assessment Questions 1. ONSET: "When did the cough begin?"      Saturday 2. SEVERITY: "How bad is the cough today?"      Moderate 3. SPUTUM: "Describe the color of your sputum" (none, dry cough; clear, white, yellow, green)     Bloody 4. HEMOPTYSIS: "Are you coughing up any blood?" If so ask: "How much?" (flecks, streaks, tablespoons, etc.)     Yes 5. DIFFICULTY BREATHING: "Are you having difficulty breathing?" If Yes, ask: "How bad is it?" (e.g., mild, moderate, severe)    - MILD: No SOB at rest, mild SOB with walking, speaks normally in sentences, can lie down, no retractions, pulse < 100.    - MODERATE: SOB at rest, SOB with minimal exertion and prefers to sit, cannot lie down flat, speaks in phrases, mild retractions, audible wheezing, pulse 100-120.    - SEVERE: Very SOB at rest, speaks in single words, struggling to breathe, sitting hunched forward, retractions, pulse > 120      No 6. FEVER: "Do you have a fever?" If Yes, ask: "What is your temperature, how was it measured, and when did it start?"     Low grade 7. CARDIAC HISTORY: "Do you have any history of heart disease?" (e.g., heart attack, congestive heart failure)      No 8. LUNG HISTORY: "Do you have any history of lung disease?"  (e.g., pulmonary embolus, asthma, emphysema)     Asthma 9. PE RISK FACTORS: "Do you have a history of blood clots?" (or: recent major surgery, recent prolonged travel, bedridden)     No 10. OTHER SYMPTOMS: "Do you have any other symptoms?" (e.g., runny nose, wheezing,  chest pain)       Sore throat, ear pain 11. PREGNANCY: "Is there any chance you are pregnant?" "When was your last menstrual period?"       No 12. TRAVEL: "Have you traveled out of the country in the last month?" (e.g., travel history, exposures)       No  Protocols used: Cough - Acute Productive-A-AH

## 2022-05-29 ENCOUNTER — Ambulatory Visit (INDEPENDENT_AMBULATORY_CARE_PROVIDER_SITE_OTHER): Payer: Medicare Other | Admitting: Family Medicine

## 2022-05-29 ENCOUNTER — Encounter: Payer: Self-pay | Admitting: Family Medicine

## 2022-05-29 VITALS — BP 113/58 | HR 89 | Temp 98.0°F | Ht 59.5 in | Wt 216.0 lb

## 2022-05-29 DIAGNOSIS — Z794 Long term (current) use of insulin: Secondary | ICD-10-CM | POA: Diagnosis not present

## 2022-05-29 DIAGNOSIS — E1169 Type 2 diabetes mellitus with other specified complication: Secondary | ICD-10-CM | POA: Diagnosis not present

## 2022-05-29 DIAGNOSIS — Z0000 Encounter for general adult medical examination without abnormal findings: Secondary | ICD-10-CM | POA: Diagnosis not present

## 2022-05-29 DIAGNOSIS — E1159 Type 2 diabetes mellitus with other circulatory complications: Secondary | ICD-10-CM | POA: Diagnosis not present

## 2022-05-29 DIAGNOSIS — Z7984 Long term (current) use of oral hypoglycemic drugs: Secondary | ICD-10-CM | POA: Diagnosis not present

## 2022-05-29 DIAGNOSIS — J0100 Acute maxillary sinusitis, unspecified: Secondary | ICD-10-CM | POA: Insufficient documentation

## 2022-05-29 DIAGNOSIS — N3281 Overactive bladder: Secondary | ICD-10-CM | POA: Diagnosis not present

## 2022-05-29 DIAGNOSIS — Z87891 Personal history of nicotine dependence: Secondary | ICD-10-CM

## 2022-05-29 DIAGNOSIS — I152 Hypertension secondary to endocrine disorders: Secondary | ICD-10-CM | POA: Diagnosis not present

## 2022-05-29 DIAGNOSIS — E785 Hyperlipidemia, unspecified: Secondary | ICD-10-CM | POA: Diagnosis not present

## 2022-05-29 MED ORDER — SPIRONOLACTONE 25 MG PO TABS: 25.0000 mg | ORAL_TABLET | Freq: Every day | ORAL | 3 refills | Status: DC

## 2022-05-29 MED ORDER — AZITHROMYCIN 250 MG PO TABS: ORAL_TABLET | ORAL | 0 refills | Status: AC

## 2022-05-29 MED ORDER — DOXYCYCLINE HYCLATE 100 MG PO TABS: 100.0000 mg | ORAL_TABLET | Freq: Two times a day (BID) | ORAL | 0 refills | Status: DC

## 2022-05-29 NOTE — Assessment & Plan Note (Signed)
Seen earlier this week in office without abx treatment; will treat today

## 2022-05-29 NOTE — Assessment & Plan Note (Signed)
Chronic, stable Goal <130/<80 Continue spiro 25 mg; asa 81 mg, farxiga 10 mg, glipizide 10 mg bid, tresiba 22 u, zestoretic 20-12.5, crestor 10 mg

## 2022-05-29 NOTE — Patient Instructions (Signed)
The CDC recommends two doses of Shingrix (the new shingles vaccine) separated by 2 to 6 months for adults age 62 years and older. I recommend checking with your insurance plan regarding coverage for this vaccine.    Schedule 2nd shot  Also, can get RSV there too at pharmacy

## 2022-05-29 NOTE — Assessment & Plan Note (Signed)
UTD on dental and vision; recommend vaccines at pharmacy level

## 2022-05-29 NOTE — Assessment & Plan Note (Signed)

## 2022-05-29 NOTE — Progress Notes (Signed)
Annual Wellness Visit     Patient: Alexis Lloyd, Female    DOB: 1960/07/30, 61 y.o.   MRN: [id number] Visit Date: 05/29/2022  Today's Provider: Gwyneth Sprout, FNP  Re Introduced to nurse practitioner role and practice setting.  All questions answered.  Discussed provider/patient relationship and expectations.  Chief Complaint  Patient presents with   Annual Exam   Subjective    Alexis Lloyd is a 61 y.o. female who presents today for her Annual Wellness Visit. She reports consuming a general diet. The patient does not participate in regular exercise at present. She generally feels well. She reports sleeping poorly. She does have additional problems to discuss today.   HPI Cough: Patient complains of cough. Symptoms began 2 weeks ago. Cough described as productive of green/yellow sputum, with shortness of breath, worsening over time. . Associated symptoms include chills, dyspnea, fever, nasal congestion, sneezing, sore throat, and wheezing. Current treatments have included  OTC cough medication  , with poor improvement.    Medications: Outpatient Medications Prior to Visit  Medication Sig   albuterol (VENTOLIN HFA) 108 (90 Base) MCG/ACT inhaler Inhale 2 puffs into the lungs every 6 (six) hours as needed.   ascorbic Acid (VITAMIN C) 500 MG CPCR Take 500 mg by mouth daily.   aspirin EC 81 MG tablet Take 1 tablet (81 mg total) by mouth daily. Swallow whole.   Blood Glucose Monitoring Suppl (ONETOUCH VERIO) w/Device KIT To check blood sugar once daily   Cholecalciferol (VITAMIN D3) 50 MCG (2000 UT) TABS Take 2,000 Units by mouth.   conjugated estrogens (PREMARIN) vaginal cream Place 1 Applicatorful vaginally daily.   Continuous Blood Gluc Receiver (FREESTYLE LIBRE 14 DAY READER) DEVI Use per package directions.   Cyanocobalamin (VITAMIN B-12) 3000 MCG SUBL Place 1 tablet under the tongue daily at 6 (six) AM. Gummies   dapagliflozin propanediol (FARXIGA) 10 MG TABS tablet Take 1  tablet (10 mg total) by mouth daily before breakfast. Patient receives via [employer] patient assistance   fluconazole (DIFLUCAN) 150 MG tablet Take for vaginal yeast, repeat dose in 4 days is symptoms remain.   glipiZIDE (GLUCOTROL) 10 MG tablet TAKE 1 TABLET BY MOUTH TWICE  DAILY BEFORE MEALS   glucose blood (ONETOUCH VERIO) test strip To check blood sugar once daily   ibuprofen (ADVIL) 200 MG tablet Take 400 mg by mouth every 6 (six) hours as needed.   insulin degludec (TRESIBA FLEXTOUCH) 100 UNIT/ML FlexTouch Pen Inject 22 Units into the skin at bedtime.   Insulin Pen Needle (PEN NEEDLES) 32G X 6 MM MISC 1 each by Does not apply route at bedtime.   Lancets (ONETOUCH ULTRASOFT) lancets Use as instructed   lisinopril-hydrochlorothiazide (ZESTORETIC) 20-12.5 MG tablet Take 2 tablets by mouth once daily   loperamide (IMODIUM) 2 MG capsule Take 2 capsules (4 mg total) by mouth 4 (four) times daily as needed for diarrhea or loose stools.   montelukast (SINGULAIR) 10 MG tablet Take 1 tablet by mouth once daily   Multiple Vitamins-Minerals (PRESERVISION AREDS 2) CAPS Take 1 capsule by mouth daily.   pantoprazole (PROTONIX) 40 MG tablet Take 1 tablet (40 mg total) by mouth daily.   rosuvastatin (CRESTOR) 10 MG tablet Take 1 tablet by mouth once daily   scopolamine (TRANSDERM-SCOP) 1 MG/3DAYS Place 1 patch (1.5 mg total) onto the skin every 3 (three) days.   SUMAtriptan (IMITREX) 50 MG tablet Take 1 tablet (50 mg total) by mouth every 2 (two) hours  as needed for migraine. No more than 21m in 24 hours   [DISCONTINUED] spironolactone (ALDACTONE) 25 MG tablet Take 1 tablet by mouth once daily   levocetirizine (XYZAL) 5 MG tablet Take 1 tablet (5 mg total) by mouth every evening. (Patient not taking: Reported on 05/29/2022)   No facility-administered medications prior to visit.    Allergies  Allergen Reactions   Allegra [Fexofenadine] Nausea And Vomiting   Avelox [Moxifloxacin] Other (See Comments)     Hallucinations   Bactrim [Sulfamethoxazole-Trimethoprim] Other (See Comments)    unknown   Etodolac Nausea Only   Fish-Derived Products Nausea And Vomiting    With "seafood"   Penicillins Diarrhea and Nausea And Vomiting   Shellfish Allergy Nausea Only   Sulfa Antibiotics Other (See Comments)    unknown   Macrobid [Nitrofurantoin Monohyd Macro] Other (See Comments)    Constipation and globus hystericus.    Patient Care Team: PGwyneth Sprout FNP as PCP - General (Family Medicine) FImagene Riches CNM as Midwife (Obstetrics) [facility]Germaine Pomfret [facility]) OLonia Farber MD as Consulting Physician (Endocrinology) LValente David RN as TB[place] CityManagement  Review of Systems  Last CBC Lab Results  Component Value Date   WBC 7.2 05/17/2021   HGB 11.6 05/17/2021   HCT 34.6 05/17/2021   MCV 85 05/17/2021   MCH 28.4 05/17/2021   RDW 12.7 05/17/2021   PLT 315 113/24/4010  Last metabolic panel Lab Results  Component Value Date   GLUCOSE 174 (H) 12/11/2021   NA 137 12/11/2021   K 4.2 12/11/2021   CL 98 12/11/2021   CO2 22 12/11/2021   BUN 23 12/11/2021   CREATININE 1.01 (H) 12/11/2021   EGFR 63 12/11/2021   CALCIUM 10.2 12/11/2021   PROT 7.0 05/17/2021   ALBUMIN 4.3 05/17/2021   LABGLOB 2.7 05/17/2021   AGRATIO 1.6 05/17/2021   BILITOT 0.2 05/17/2021   ALKPHOS 103 05/17/2021   AST 6 05/17/2021   ALT 14 05/17/2021   ANIONGAP 7 06/15/2020   Last lipids Lab Results  Component Value Date   CHOL 146 11/29/2021   HDL 25 (L) 11/29/2021   LDLCALC 85 11/29/2021   TRIG 212 (H) 11/29/2021   CHOLHDL 5.8 (H) 11/29/2021   Last hemoglobin A1c Lab Results  Component Value Date   HGBA1C 9.5 (A) 04/10/2022   Last thyroid functions Lab Results  Component Value Date   TSH 2.190 09/16/2018      Objective    Vitals: BP (!) 113/58 (BP Location: Right Arm, Patient Position: Sitting, Cuff Size: Large)   Pulse 89   Temp 98 F (36.7 C)  (Oral)   Ht 4' 11.5" (1.511 m)   Wt 216 lb (98 kg)   SpO2 98%   BMI 42.90 kg/m   BP Readings from Last 3 Encounters:  05/29/22 (!) 113/58  05/26/22 124/64  04/10/22 127/71   Wt Readings from Last 3 Encounters:  05/29/22 216 lb (98 kg)  05/26/22 216 lb 12.8 oz (98.3 kg)  04/10/22 213 lb (96.6 kg)   SpO2 Readings from Last 3 Encounters:  05/29/22 98%  05/26/22 99%  04/10/22 99%   Physical Exam Vitals and nursing note reviewed.  Constitutional:      General: She is awake. She is not in acute distress.    Appearance: Normal appearance. She is well-developed and well-groomed. She is obese. She is not ill-appearing, toxic-appearing or diaphoretic.  HENT:     Head: Normocephalic and atraumatic.  Jaw: There is normal jaw occlusion. No trismus, tenderness, swelling or pain on movement.     Right Ear: Hearing, tympanic membrane, ear canal and external ear normal. There is no impacted cerumen.     Left Ear: Hearing, tympanic membrane, ear canal and external ear normal. There is no impacted cerumen.     Nose: Congestion present. No rhinorrhea.     Right Turbinates: Not enlarged, swollen or pale.     Left Turbinates: Not enlarged, swollen or pale.     Right Sinus: No maxillary sinus tenderness or frontal sinus tenderness.     Left Sinus: No maxillary sinus tenderness or frontal sinus tenderness.     Mouth/Throat:     Lips: Pink.     Mouth: Mucous membranes are moist. No injury.     Tongue: No lesions.     Pharynx: Oropharynx is clear. Uvula midline. No pharyngeal swelling, oropharyngeal exudate, posterior oropharyngeal erythema or uvula swelling.     Tonsils: No tonsillar exudate or tonsillar abscesses.  Eyes:     General: Lids are normal. Lids are everted, no foreign bodies appreciated. Vision grossly intact. Gaze aligned appropriately. No allergic shiner or visual field deficit.       Right eye: No discharge.        Left eye: No discharge.     Extraocular Movements: Extraocular  movements intact.     Conjunctiva/sclera: Conjunctivae normal.     Right eye: Right conjunctiva is not injected. No exudate.    Left eye: Left conjunctiva is not injected. No exudate.    Pupils: Pupils are equal, round, and reactive to light.  Neck:     Thyroid: No thyroid mass, thyromegaly or thyroid tenderness.     Vascular: No carotid bruit.     Trachea: Trachea normal.  Cardiovascular:     Rate and Rhythm: Normal rate and regular rhythm.     Pulses: Normal pulses.          Carotid pulses are 2+ on the right side and 2+ on the left side.      Radial pulses are 2+ on the right side and 2+ on the left side.       Dorsalis pedis pulses are 2+ on the right side and 2+ on the left side.       Posterior tibial pulses are 2+ on the right side and 2+ on the left side.     Heart sounds: Normal heart sounds, S1 normal and S2 normal. No murmur heard.    No friction rub. No gallop.  Pulmonary:     Effort: Pulmonary effort is normal. No respiratory distress.     Breath sounds: Normal breath sounds and air entry. No stridor. No wheezing, rhonchi or rales.  Chest:     Chest wall: No tenderness.  Abdominal:     General: Abdomen is flat. Bowel sounds are normal. There is no distension.     Palpations: Abdomen is soft. There is no mass.     Tenderness: There is no abdominal tenderness. There is no right CVA tenderness, left CVA tenderness, guarding or rebound.     Hernia: No hernia is present.  Genitourinary:    Comments: Exam deferred; denies complaints Musculoskeletal:        General: No swelling, tenderness, deformity or signs of injury. Normal range of motion.     Cervical back: Full passive range of motion without pain, normal range of motion and neck supple. No edema, rigidity or tenderness. No muscular  tenderness.     Right lower leg: No edema.     Left lower leg: No edema.  Lymphadenopathy:     Cervical: No cervical adenopathy.     Right cervical: No superficial, deep or posterior  cervical adenopathy.    Left cervical: No superficial, deep or posterior cervical adenopathy.  Skin:    General: Skin is warm and dry.     Capillary Refill: Capillary refill takes less than 2 seconds.     Coloration: Skin is not jaundiced or pale.     Findings: No bruising, erythema, lesion or rash.  Neurological:     General: No focal deficit present.     Mental Status: She is alert and oriented to person, place, and time. Mental status is at baseline.     GCS: GCS eye subscore is 4. GCS verbal subscore is 5. GCS motor subscore is 6.     Sensory: Sensation is intact. No sensory deficit.     Motor: Motor function is intact. No weakness.     Coordination: Coordination is intact. Coordination normal.     Gait: Gait is intact. Gait normal.  Psychiatric:        Attention and Perception: Attention and perception normal.        Mood and Affect: Mood and affect normal.        Speech: Speech normal.        Behavior: Behavior normal. Behavior is cooperative.        Thought Content: Thought content normal.        Cognition and Memory: Cognition and memory normal.        Judgment: Judgment normal.    Most recent functional status assessment:    05/29/2022   10:57 AM  In your present state of health, do you have any difficulty performing the following activities:  Hearing? 0  Vision? 0  Difficulty concentrating or making decisions? 0  Walking or climbing stairs? 0  Dressing or bathing? 0  Doing errands, shopping? 0   Most recent fall risk assessment:    05/29/2022   10:57 AM  Fall Risk   Falls in the past year? 0  Number falls in past yr: 0  Injury with Fall? 0    Most recent depression screenings:    05/29/2022   10:57 AM 04/10/2022    3:17 PM  PHQ 2/9 Scores  PHQ - 2 Score 0 0  PHQ- 9 Score 4 0   Most recent cognitive screening:     No data to display         Most recent Audit-C alcohol use screening    04/10/2022    3:18 PM  Alcohol Use Disorder Test (AUDIT)   1. How often do you have a drink containing alcohol? 0  2. How many drinks containing alcohol do you have on a typical day when you are drinking? 0  3. How often do you have six or more drinks on one occasion? 0  AUDIT-C Score 0   A score of 3 or more in women, and 4 or more in men indicates increased risk for alcohol abuse, EXCEPT if all of the points are from question 1   No results found for any visits on 05/29/22.  Assessment & Plan     Annual wellness visit done today including the all of the following: Reviewed patient's Family Medical History Reviewed and updated list of patient's medical providers Assessment of cognitive impairment was done Assessed patient's functional ability Established  a written schedule for health screening services Health Risk Assessent Completed and Reviewed  Exercise Activities and Dietary recommendations  Goals      Monitor and Manage My Blood Sugar-Diabetes Type 2     Timeframe:  Long-Range Goal Priority:  High Start Date:    07/23/2020                         Expected End Date:   01/21/2023                    Follow Up within 30 days    - check blood sugar at prescribed times - check blood sugar if I feel it is too high or too low - take the blood sugar log to all doctor visits    Why is this important?   Checking your blood sugar at home helps to keep it from getting very high or very low.  Writing the results in a diary or log helps the doctor know how to care for you.  Your blood sugar log should have the time, date and the results.  Also, write down the amount of insulin or other medicine that you take.  Other information, like what you ate, exercise done and how you were feeling, will also be helpful.     Notes:      RNCM: Effective Management of DM     Care Coordination Interventions:  Lab Results  Component Value Date   HGBA1C 9.5 (A) 04/10/2022    Provided education to patient about basic DM disease process Reviewed  medications with patient and discussed importance of medication adherence Counseled on importance of regular laboratory monitoring as prescribed Discussed plans with patient for ongoing care management follow up and provided patient with direct contact information for care management team Provided patient with written educational materials related to hypo and hyperglycemia and importance of correct treatment Reviewed scheduled/upcoming provider appointments including: 05-29-2022 at 1020 am for AWV.  Will also see endocrinology in December for repeat A1C Advised patient, providing education and rationale, to check cbg BID  and record, calling pcp or endocrinologist  for findings outside established parameters. The patient states this am was 169. Education on the goal of fasting of <130 and post prandial of <180. Education and support given Review of patient status, including review of consultants reports, relevant laboratory and other test results, and medications completed The patient agrees to the care coordination program and working with the [facility] for help in managing DM and other chronic conditions. The patient knows how to reach the [facility] and knows the [facility] will reach out on a regular basis.  Reviewed decrease of A1C, discussed plan to continue decreasing (diet change, exercise, increasing water intake)           Immunization History  Administered Date(s) Administered   PFIZER Comirnaty(Gray Top)Covid-19 Tri-Sucrose Vaccine 08/15/2020   PFIZER(Purple Top)SARS-COV-2 Vaccination 07/31/2019, 09/13/2019   Pfizer Covid-19 Vaccine Bivalent Booster 34yr & up 03/28/2021   Pneumococcal Polysaccharide-23 06/22/2009, 08/06/2015   Tdap 08/31/2017   Zoster Recombinat (Shingrix) 08/15/2021    Health Maintenance  Topic Date Due   Zoster Vaccines- Shingrix (2 of 2) 10/10/2021   COVID-19 Vaccine (5 - 2023-24 season) 02/07/2022   INFLUENZA VACCINE  09/07/2022 (Originally 01/07/2022)   FOOT EXAM   07/15/2022   HEMOGLOBIN A1C  10/09/2022   Diabetic kidney evaluation - Urine ACR  11/30/2022   Diabetic kidney evaluation - eGFR measurement  12/12/2022   OPHTHALMOLOGY EXAM  03/06/2023   Medicare Annual Wellness (AWV)  05/30/2023   MAMMOGRAM  12/17/2023   PAP SMEAR-Modifier  05/17/2024   DTaP/Tdap/Td (2 - Td or Tdap) 09/01/2027   COLONOSCOPY (Pts 45-24yr Insurance coverage will need to be confirmed)  09/12/2027   Hepatitis C Screening  Completed   HIV Screening  Completed   HPV VACCINES  Aged Out     Discussed health benefits of physical activity, and encouraged her to engage in regular exercise appropriate for her age and condition.    Problem List Items Addressed This Visit       Cardiovascular and Mediastinum   Hypertension associated with diabetes ([place]    Chronic, stable Goal <130/<80 Continue spiro 25 mg; asa 81 mg, farxiga 10 mg, glipizide 10 mg bid, tresiba 22 u, zestoretic 20-12.5, crestor 10 mg       Relevant Medications   spironolactone (ALDACTONE) 25 MG tablet     Respiratory   Acute non-recurrent maxillary sinusitis    Seen earlier this week in office without abx treatment; will treat today      Relevant Medications   azithromycin (ZITHROMAX) 250 MG tablet   doxycycline (VIBRA-TABS) 100 MG tablet     Genitourinary   OAB (overactive bladder)    Chronic, stable Will refer for further assistance       Relevant Orders   Ambulatory referral to Urogynecology     Other   Annual physical exam    UTD on dental and vision; recommend vaccines at pharmacy level       Relevant Orders   Comprehensive Metabolic Panel (CMET)   CBC   TSH   Lipid panel   Encounter for subsequent annual wellness visit (AWV) in Medicare patient - Primary    Things to do to keep yourself healthy  - Exercise at least 30-45 minutes a day, 3-4 days a week.  - Eat a low-fat diet with lots of fruits and vegetables, up to 7-9 servings per day.  - Seatbelts can save your life. Wear  them always.  - Smoke detectors on every level of your home, check batteries every year.  - Eye Doctor - have an eye exam every 1-2 years  - Safe sex - if you may be exposed to STDs, use a condom.  - Alcohol -  If you drink, do it moderately, less than 2 drinks per day.  - [place] Choose someone to speak for you if you are not able.  - Depression is common in our stressful world.If you're feeling down or losing interest in things you normally enjoy, please come in for a visit.  - Violence - If anyone is threatening or hurting you, please call immediately.       Return if symptoms worsen or fail to improve.    IVonna Kotyk FNP, have reviewed all documentation for this visit. The documentation on 05/29/22 for the exam, diagnosis, procedures, and orders are all accurate and complete.  EGwyneth Sprout FMiddle [phone](phone) [phone](fax)  CL[place]

## 2022-05-29 NOTE — Assessment & Plan Note (Signed)
Chronic, stable Will refer for further assistance

## 2022-05-30 LAB — COMPREHENSIVE METABOLIC PANEL
ALT: 13 IU/L (ref 0–32)
AST: 5 IU/L (ref 0–40)
Albumin/Globulin Ratio: 1.4 (ref 1.2–2.2)
Albumin: 4.2 g/dL (ref 3.9–4.9)
Alkaline Phosphatase: 102 IU/L (ref 44–121)
BUN/Creatinine Ratio: 21 (ref 12–28)
BUN: 17 mg/dL (ref 8–27)
Bilirubin Total: 0.3 mg/dL (ref 0.0–1.2)
CO2: 25 mmol/L (ref 20–29)
Calcium: 10.1 mg/dL (ref 8.7–10.3)
Chloride: 103 mmol/L (ref 96–106)
Creatinine, Ser: 0.8 mg/dL (ref 0.57–1.00)
Globulin, Total: 3 g/dL (ref 1.5–4.5)
Glucose: 187 mg/dL — ABNORMAL HIGH (ref 70–99)
Potassium: 4.5 mmol/L (ref 3.5–5.2)
Sodium: 143 mmol/L (ref 134–144)
Total Protein: 7.2 g/dL (ref 6.0–8.5)
eGFR: 83 mL/min/{1.73_m2} (ref >59)

## 2022-05-30 LAB — LIPID PANEL
Chol/HDL Ratio: 5.4 ratio — ABNORMAL HIGH (ref 0.0–4.4)
Cholesterol, Total: 152 mg/dL (ref 100–199)
HDL: 28 mg/dL — ABNORMAL LOW (ref >39)
LDL Chol Calc (NIH): 98 mg/dL (ref 0–99)
Triglycerides: 142 mg/dL (ref 0–149)
VLDL Cholesterol Cal: 26 mg/dL (ref 5–40)

## 2022-05-30 LAB — TSH: TSH: 1.99 u[IU]/mL (ref 0.450–4.500)

## 2022-05-30 LAB — CBC
Hematocrit: 36 % (ref 34.0–46.6)
Hemoglobin: 11.9 g/dL (ref 11.1–15.9)
MCH: 28 pg (ref 26.6–33.0)
MCHC: 33.1 g/dL (ref 31.5–35.7)
MCV: 85 fL (ref 79–97)
Platelets: 302 10*3/uL (ref 150–450)
RBC: 4.25 x10E6/uL (ref 3.77–5.28)
RDW: 12.6 % (ref 11.7–15.4)
WBC: 8.2 10*3/uL (ref 3.4–10.8)

## 2022-05-30 NOTE — Progress Notes (Signed)
Labs remain stable. Continue to work on diet and exercise in the new year.  Gwyneth Sprout, [address] [place], [place] [address] [phone] (phone) [phone] (fax) [place]

## 2022-06-04 ENCOUNTER — Ambulatory Visit: Payer: Self-pay | Admitting: *Deleted

## 2022-06-04 NOTE — Patient Outreach (Addendum)
  Care Coordination   Follow Up Visit Note   06/04/2022 Name: Alexis Lloyd MRN: [id number] DOB: [birth year]  Alexis Lloyd is a 61 y.o. year old female who sees Alexis Sprout, FNP for primary care. I spoke with  Alexis Lloyd by phone today.  What matters to the patients health and wellness today?  Was recently diagnosed with upper respirator infection, state she is feeling much better after taking doxycycline and a Z-pack.    Goals Addressed             This Visit's Progress    RNCM: Effective Management of DM   On track    Care Coordination Interventions:  Lab Results  Component Value Date   HGBA1C 9.5 (A) 04/10/2022    Provided education to Lloyd about basic DM disease process Reviewed medications with Lloyd and discussed importance of medication adherence Counseled on importance of regular laboratory monitoring as prescribed Discussed plans with Lloyd for ongoing care management follow up and provided Lloyd with direct contact information for care management team Provided Lloyd with written educational materials related to hypo and hyperglycemia and importance of correct treatment Reviewed scheduled/upcoming provider appointments including: completed AWV on 12/21.  Will also see endocrinology on January 10 for repeat A1C.  Advised to call to get exact appointment time Advised Lloyd, providing education and rationale, to check cbg BID  and record, calling pcp or endocrinologist  for findings outside established parameters. The Lloyd states this am was 158. Education on the goal of fasting of <130 and post prandial of <180. Education and support given Review of Lloyd status, including review of consultants reports, relevant laboratory and other test results, and medications completed The Lloyd agrees to the care coordination program and working with the [facility] for help in managing DM and other chronic conditions. The Lloyd knows how to reach the [facility] and  knows the [facility] will reach out on a regular basis.  Reviewed decrease of A1C, discussed plan to continue decreasing (diet change, exercise, increasing water intake) Discussed plan to change from [place] to Ozempic starting in January           SDOH assessments and interventions completed:  No     Care Coordination Interventions:  Yes, provided   Follow up plan: Follow up call scheduled for 1/26    Encounter Outcome:  Pt. Visit Completed   [name], RN, MSN, [place] Care Management Care Management Coordinator [phone]

## 2022-06-17 ENCOUNTER — Ambulatory Visit: Payer: Medicare Other

## 2022-06-17 DIAGNOSIS — E1165 Type 2 diabetes mellitus with hyperglycemia: Secondary | ICD-10-CM

## 2022-06-17 DIAGNOSIS — E1159 Type 2 diabetes mellitus with other circulatory complications: Secondary | ICD-10-CM

## 2022-06-17 DIAGNOSIS — I152 Hypertension secondary to endocrine disorders: Secondary | ICD-10-CM

## 2022-06-17 NOTE — Progress Notes (Signed)
Care Management & Coordination Services Pharmacy Note  06/17/2022 Name:  Alexis Lloyd MRN:  [id number] DOB:  10/06/1960  Summary: Patient presents for follow-up consult.   -Blood sugars seem to have improved since last visit. Patient reports she was recently informed that her PAP for ozempic and Tyler Aas was improved. She has an upcoming apt with Endocrinology and will plan to discuss the long-term plan for Ozempic and [place]. Anticipate patient needing to remain on both while titrating Ozempic. Patient is aware to let clinical team know if any updates need to be made to her prescription.   Recommendations/Changes made from today's visit: Continue current medications (Will likely start Ozempic Soon)   Follow up plan: CPP follow-up 3 months    Subjective: Alexis Lloyd is an 62 y.o. year old female who is a primary patient of Gwyneth Sprout, FNP.  The care coordination team was consulted for assistance with disease management and care coordination needs.    Engaged with patient by telephone for follow up visit.  Patient Care Team: Gwyneth Sprout, FNP as PCP - General (Family Medicine) Imagene Riches, CNM as Midwife (Obstetrics) Germaine Pomfret, [facility] (Pharmacist) Lonia Farber, MD as Consulting Physician (Endocrinology) [name], RN as [place] Management  Recent office visits: 05/26/22: Patient presented to [name], PA-C for cough,  04/10/22: Patient presented to [name], FNP for follow-up. A1c 9.5%.  04/03/22: Patient presented to [name], PA-C for hemoptysis. Azithromycin.    Recent consult visits: 02/06/22: Patient presented to Mee Hives (Endocrinology)   Hospital visits: None in previous 6 months   Objective:  Lab Results  Component Value Date   CREATININE 0.80 05/29/2022   BUN 17 05/29/2022   EGFR 83 05/29/2022   GFRNONAA 67 08/01/2020   GFRAA 77 08/01/2020   NA 143 05/29/2022   K 4.5 05/29/2022    CALCIUM 10.1 05/29/2022   CO2 25 05/29/2022   GLUCOSE 187 (H) 05/29/2022    Lab Results  Component Value Date/Time   HGBA1C 9.5 (A) 04/10/2022 03:29 PM   HGBA1C 11.6 (A) 11/29/2021 10:48 AM   HGBA1C 11.7 (H) 08/30/2021 10:49 AM   HGBA1C 9.1 (H) 05/17/2021 09:38 AM   MICROALBUR 50 08/31/2017 10:06 AM   MICROALBUR 50 03/05/2016 02:04 PM    Last diabetic Eye exam:  Lab Results  Component Value Date/Time   HMDIABEYEEXA No Retinopathy 03/05/2022 12:00 AM    Last diabetic Foot exam: No results found for: "HMDIABFOOTEX"   Lab Results  Component Value Date   CHOL 152 05/29/2022   HDL 28 (L) 05/29/2022   LDLCALC 98 05/29/2022   TRIG 142 05/29/2022   CHOLHDL 5.4 (H) 05/29/2022       Latest Ref Rng & Units 05/29/2022   11:11 AM 05/17/2021    9:38 AM 08/01/2020   11:27 AM  Hepatic Function  Total Protein 6.0 - 8.5 g/dL 7.2  7.0  7.1   Albumin 3.9 - 4.9 g/dL 4.2  4.3  4.3   AST 0 - 40 IU/L '5  6  8   '$ ALT 0 - 32 IU/L '13  14  13   '$ Alk Phosphatase 44 - 121 IU/L 102  103  64   Total Bilirubin 0.0 - 1.2 mg/dL 0.3  0.2  0.3     Lab Results  Component Value Date/Time   TSH 1.990 05/29/2022 11:11 AM   TSH 2.190 09/16/2018 11:12 AM       Latest Ref  Rng & Units 05/29/2022   11:11 AM 05/17/2021    9:38 AM 08/01/2020   11:27 AM  CBC  WBC 3.4 - 10.8 x10E3/uL 8.2  7.2  6.5   Hemoglobin 11.1 - 15.9 g/dL 11.9  11.6  11.0   Hematocrit 34.0 - 46.6 % 36.0  34.6  33.2   Platelets 150 - 450 x10E3/uL 302  315  243     Lab Results  Component Value Date/Time   VITAMINB12 461 08/31/2017 10:50 AM   VITAMINB12 468 01/15/2017 03:50 PM    Clinical ASCVD: No  The 10-year ASCVD risk score (Arnett DK, et al., 2019) is: 13.6%   Values used to calculate the score:     Age: 62 years     Sex: Female     Is Non-Hispanic African American: Yes     Diabetic: Yes     Tobacco smoker: No     Systolic Blood Pressure: 086 mmHg     Is BP treated: Yes     HDL Cholesterol: 28 mg/dL     Total  Cholesterol: 152 mg/dL       05/29/2022   10:57 AM 04/10/2022    3:17 PM 04/03/2022    2:36 PM  Depression screen PHQ 2/9  Decreased Interest 0 0 0  Down, Depressed, Hopeless 0 0 0  PHQ - 2 Score 0 0 0  Altered sleeping 2 0 0  Tired, decreased energy 2 0 0  Change in appetite 0 0 0  Feeling bad or failure about yourself  0 0 0  Trouble concentrating 0 0 0  Moving slowly or fidgety/restless 0 0 0  Suicidal thoughts 0 0 0  PHQ-9 Score 4 0 0  Difficult doing work/chores Not difficult at all Not difficult at all Not difficult at all    Social History   Tobacco Use  Smoking Status Former   Packs/day: 0.25   Years: 21.00   Total pack years: 5.25   Types: Cigarettes   Quit date: 06/08/1997   Years since quitting: 25.0  Smokeless Tobacco Never   BP Readings from Last 3 Encounters:  05/29/22 (!) 113/58  05/26/22 124/64  04/10/22 127/71   Pulse Readings from Last 3 Encounters:  05/29/22 89  05/26/22 90  04/10/22 92   Wt Readings from Last 3 Encounters:  05/29/22 216 lb (98 kg)  05/26/22 216 lb 12.8 oz (98.3 kg)  04/10/22 213 lb (96.6 kg)   BMI Readings from Last 3 Encounters:  05/29/22 42.90 kg/m  05/26/22 43.06 kg/m  04/10/22 43.02 kg/m    Allergies  Allergen Reactions   Allegra [Fexofenadine] Nausea And Vomiting   Avelox [Moxifloxacin] Other (See Comments)    Hallucinations   Bactrim [Sulfamethoxazole-Trimethoprim] Other (See Comments)    unknown   Etodolac Nausea Only   Fish-Derived Products Nausea And Vomiting    With "seafood"   Penicillins Diarrhea and Nausea And Vomiting   Shellfish Allergy Nausea Only   Sulfa Antibiotics Other (See Comments)    unknown   Macrobid [Nitrofurantoin Monohyd Macro] Other (See Comments)    Constipation and globus hystericus.    Medications Reviewed Today     Reviewed by Gwyneth Sprout, FNP (Family Nurse Practitioner) on 05/29/22 at 1212  Med List Status: <None>   Medication Order Taking? Sig Documenting Provider  Last Dose Status Informant  albuterol (VENTOLIN HFA) 108 (90 Base) MCG/ACT inhaler [id number] Yes Inhale 2 puffs into the lungs every 6 (six) hours as needed. [name], PA-C  Taking Active   ascorbic Acid (VITAMIN C) 500 MG CPCR [id number] Yes Take 500 mg by mouth daily. [provider] Taking Active   aspirin EC 81 MG tablet [id number] Yes Take 1 tablet (81 mg total) by mouth daily. Swallow whole. Fenton Malling M, PA-C Taking Active   azithromycin (ZITHROMAX) 250 MG tablet [id number] Yes Take 2 tablets on day 1, then 1 tablet daily on days 2 through 5 [name] T, FNP  Active   Blood Glucose Monitoring Suppl [facility] VERIO) w/Device Drucie Opitz [id number] Yes To check blood sugar once daily Mar Daring, PA-C Taking Active   Cholecalciferol (VITAMIN D3) 50 MCG (2000 UT) TABS [id number] Yes Take 2,000 Units by mouth. [provider] Taking Active   conjugated estrogens (PREMARIN) vaginal cream [id number] Yes Place 1 Applicatorful vaginally daily. Gwyneth Sprout, FNP Taking Active   Continuous Blood Gluc Receiver (FREESTYLE LIBRE [address] READER) DEVI [id number] Yes Use per package directions. Myles Gip, DO Taking Active   Cyanocobalamin (VITAMIN B-12) 3000 MCG SUBL [id number] Yes Place 1 tablet under the tongue daily at 6 (six) AM. Gummies [provider] Taking Active   dapagliflozin propanediol (FARXIGA) 10 MG TABS tablet [id number] Yes Take 1 tablet (10 mg total) by mouth daily before breakfast. Patient receives via AZ&ME patient assistance Gwyneth Sprout, FNP Taking Active   doxycycline (VIBRA-TABS) 100 MG tablet [id number] Yes Take 1 tablet (100 mg total) by mouth 2 (two) times daily. Gwyneth Sprout, FNP  Active   fluconazole (DIFLUCAN) 150 MG tablet [id number] Yes Take for vaginal yeast, repeat dose in 4 days is symptoms remain. Gwyneth Sprout, FNP Taking Active   glipiZIDE (GLUCOTROL) 10 MG tablet [id number] Yes TAKE 1 TABLET BY MOUTH TWICE  DAILY BEFORE MEALS  Gwyneth Sprout, FNP Taking Active   glucose blood [facility] VERIO) test strip [id number] Yes To check blood sugar once daily Myles Gip, DO Taking Active   ibuprofen (ADVIL) 200 MG tablet [id number] Yes Take 400 mg by mouth every 6 (six) hours as needed. [provider] Taking Active   insulin degludec (TRESIBA FLEXTOUCH) 100 UNIT/ML FlexTouch Pen [id number] Yes Inject 22 Units into the skin at bedtime. [provider] Taking Active   Insulin Pen Needle (PEN NEEDLES) 32G X 6 MM MISC [id number] Yes 1 each by Does not apply route at bedtime. Gwyneth Sprout, FNP Taking Active   Lancets [facility] ULTRASOFT) lancets [id number] Yes Use as instructed Myles Gip, DO Taking Active   levocetirizine (XYZAL) 5 MG tablet [id number] No Take 1 tablet (5 mg total) by mouth every evening.  Patient not taking: Reported on 05/29/2022   [name], PA-C Not Taking Active   lisinopril-hydrochlorothiazide (ZESTORETIC) 20-12.5 MG tablet [id number] Yes Take 2 tablets by mouth once daily Gwyneth Sprout, FNP Taking Active   loperamide (IMODIUM) 2 MG capsule [id number] Yes Take 2 capsules (4 mg total) by mouth 4 (four) times daily as needed for diarrhea or loose stools. Gwyneth Sprout, FNP Taking Active   montelukast (SINGULAIR) 10 MG tablet [id number] Yes Take 1 tablet by mouth once daily Gwyneth Sprout, FNP Taking Active   Multiple Vitamins-Minerals (PRESERVISION AREDS 2) CAPS [id number] Yes Take 1 capsule by mouth daily. [provider] Taking Active   pantoprazole (PROTONIX) 40 MG tablet [id number] Yes Take 1 tablet (40 mg total) by mouth daily. Gwyneth Sprout, FNP Taking Active   rosuvastatin (CRESTOR) 10 MG tablet [id number] Yes Take 1 tablet by mouth  once daily Gwyneth Sprout, FNP Taking Active   scopolamine (TRANSDERM-SCOP) 1 MG/3DAYS [id number] Yes Place 1 patch (1.5 mg total) onto the skin every 3 (three) days. Gwyneth Sprout, FNP Taking Active   spironolactone (ALDACTONE) 25 MG tablet  [id number]  Take 1 tablet (25 mg total) by mouth daily. Gwyneth Sprout, FNP  Active   SUMAtriptan (IMITREX) 50 MG tablet [id number] Yes Take 1 tablet (50 mg total) by mouth every 2 (two) hours as needed for migraine. No more than '200mg'$  in 24 hours Mar Daring, [place] Taking Active Other            Patient Active Problem List   Diagnosis Date Noted   OAB (overactive bladder) 05/29/2022   Encounter for subsequent annual wellness visit (AWV) in Medicare patient 05/29/2022   Acute non-recurrent maxillary sinusitis 05/29/2022   Itching in the vaginal area 11/29/2021   Motion sickness 11/29/2021   Acquired deformity of left toe 11/29/2021   Vaginal dryness, menopausal 10/10/2021   Vaginal discharge 10/10/2021   Acute cystitis with hematuria 10/10/2021   Urinary frequency 08/30/2021   Urge incontinence of urine 08/30/2021   Vaginal yeast infection 08/30/2021   Annual physical exam 05/17/2021   Screening for cervical cancer 05/17/2021   Encounter for screening mammogram for malignant neoplasm of breast 05/17/2021   Elevated LDL cholesterol level 05/17/2021   Iron deficiency anemia secondary to inadequate dietary iron intake 05/17/2021   Hyperlipidemia associated with type 2 diabetes mellitus ([place]) 05/17/2021   Uncontrolled type 2 diabetes mellitus with hyperglycemia, with long-term current use of insulin ([place]) 05/17/2021   Need for shingles vaccine 05/17/2021   Routine screening for STI (sexually transmitted infection) 05/17/2021   Itchy skin 05/17/2021   Dry skin dermatitis 05/17/2021   Pain due to onychomycosis of toenails of both feet 90/24/0973   History of Helicobacter pylori infection 11/05/2017   Heartburn    Diverticulosis of large intestine without diverticulitis    Warts of foot 06/30/2017   Hallux rigidus of left foot 01/28/2017   Hallux rigidus, right foot 01/28/2017   Pes planus 01/28/2017   Diabetic neuropathy ([place]) 11/18/2014   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    Hypercholesteremia 11/18/2014   Hypertension associated with diabetes ([place]) 11/18/2014   Personal history of breast cancer 10/04/2014    Immunization History  Administered Date(s) Administered   PFIZER Comirnaty(Gray Top)Covid-19 Tri-Sucrose Vaccine 08/15/2020   PFIZER(Purple Top)SARS-COV-2 Vaccination 07/31/2019, 09/13/2019   Pfizer Covid-19 Vaccine Bivalent Booster 60yr & up 03/28/2021   Pneumococcal Polysaccharide-23 06/22/2009, 08/06/2015   Tdap 08/31/2017   Zoster Recombinat (Shingrix) 08/15/2021     Compliance/Adherence/Medication fill history: Care Gaps: Shingrix Vaccine COVID-19 Vaccine Influenza Vaccine;O A1C   Star-Rating Drugs: Lisinopril-HCTZ 20-12.5 mg [facility] 10 mg [facility] Rosuv[facility]atin 10 mg [facility]  SDOH:  (Social Determinants of Health) assessments and interventions performed: Yes SDOH Interventions    Flowsheet Row Office Visit from 04/03/2022 in [place] in [place] 04/22/2021 in [place] 01/25/2021 in [place] 10/30/2020 [place] 07[place]23/2020 in [place]ventions        Food Insecurity Interventions -- Intervention Not Indicated -- -- -- --  Housing Interventions -- Intervention Not Indicated -- -- -- --  Transportation Interventions -- Intervention Not Indicated -- -- -- Intervention Not Indicated  Utilities Interventions -- Intervention Not Indicated -- -- -- --  Alcohol Usage Interventions -- Intervention Not Indicated (Score <7) -- -- -- --  Depression Interventions/Treatment  PHQ2-9 Score <4  Follow-up Not Indicated -- -- -- -- --  Financial Strain Interventions -- Other (Comment)  [PAP assistant] Other (Comment)  [PAP] Intervention Not Indicated Other (Comment)  [PAP] Other (Comment)  [PAP]  Stress Interventions -- Intervention Not Indicated -- -- -- --  Social Connections Interventions -- Intervention Not Indicated -- -- -- --      SDOH Screenings   Food Insecurity: No Food Insecurity (03/03/2022)  Housing: Low Risk  (03/03/2022)  Transportation Needs: No Transportation Needs (03/03/2022)  Utilities: Not At Risk (03/03/2022)  Alcohol Screen: Low Risk  (04/10/2022)  Depression (PHQ2-9): Low Risk  (05/29/2022)  Financial Resource Strain: Low Risk  (03/03/2022)  Physical Activity: Insufficiently Active (04/13/2017)  Social Connections: Socially Integrated (03/03/2022)  Stress: No Stress Concern Present (03/03/2022)  Tobacco Use: Medium Risk (05/29/2022)    Medication Assistance:  Joni Reining obtained through [employer] medication assistance program.  Enrollment ends TBD  Medication Access: Within the past 30 days, how often has patient missed a dose of medication? None Is a pillbox or other method used to improve adherence? Yes  Factors that may affect medication adherence? financial need Are meds synced by current pharmacy? No  Are meds delivered by current pharmacy? Yes  Does patient experience delays in picking up medications due to transportation concerns? No   [facility] Services Reviewed: Is patient disadvantaged to use [facility] Pharmacy?: Yes  Current Rx insurance plan: [facility] Name and location of Current pharmacy:  [place] [address] ([address]), [place]) [place] [address] Phone: [phone] Fax: [phone]  [facility] ([place], [place] [facility] [address] [place] [address] Phone: [phone] Fax: [phone]  [place], [address] [place] [place] [address] Phone: [phone] Fax: [place], [place]. [place] [address] Phone: [phone] Fax: [phone]  [facility] Pharm[facility] services reviewed with patient today?: No  Patient requests to transfer care to [facility] Pharmacy?: No  Reason patient declined to change pharmacies: Disadvantaged due to insurance/mail order  Assessment/Plan  Hypertension (BP goal <140/90) -Controlled -Current treatment: Lisinopril-HCTZ 20-12.5 mg 2 tablets daily  Spironolactone 25 mg daily  -Medications previously tried: Amlodipine, diltiazem (hypotension)   -Current home readings:   -Current dietary habits: Limiting fried food. Eating more vegetables  -Current exercise habits: PT weekly -Denies hypotensive/hypertensive symptoms.  -Continue current medications  Hyperlipidemia: (LDL goal < 70) -uncontrolled -Current treatment: Rosuvastatin 10 mg daily -Medications previously tried: Pravastatin, Lovastatin Importance of limiting foods high in cholesterol; -Recommended rechecking lipid panel at PCP follow-up.   Diabetes (A1c goal <8%) -Managed by Mee Hives -Uncontrolled -Current medications: Farxiga 10 mg daily  Glipizide 10 mg twice daily Tresiba 24 daily  -Medications previously tried: Januvia, Metformin (diarrhea), Pioglitazone ([place]), Trulicity, Rybelsus (ineffective) -Current home glucose readings fasting glucose: 140-150s  -Reports hypoglycemic symptoms: afternoon when she hadn't had lunch. Felt confused  -Drinking water and ice tea.  -Blood sugars seem to have improved  since last visit. Patient reports she was recently informed that her PAP for ozempic and Tyler Aas was improved. She has an upcoming apt with Endocrinology and will plan to discuss the long-term plan for Ozempic and [place]. Anticipate patient needing to  remain on both while titrating Ozempic. Patient is aware to let clinical team know if any updates need to be made to her prescription.  -Continue current medications   Asthma (Goal: control symptoms and prevent exacerbations) -controlled -Current treatment  Ventolin HFA 108 mcg/act 2 puff every 6 hours as needed  Montelukast 10 mg daily  -Medications previously tried: NA  -Pulmonary function testing: NA -Exacerbations requiring treatment in last 6 months: Yes, due to recent covid infection -Patient denies consistent use of maintenance inhaler -Frequency of rescue inhaler use: twice weekly -Counseled on Proper inhaler technique; When to use rescue inhaler -Recommended to continue current medication  GERD (Goal: prevent symptoms of heartburn and reflux) -controlled -Current treatment  Pantoprazole 40 mg daily  -Medications previously tried: NA  -Recommended to continue current medication  Osteoarthritis (Goal: minimize arthritis pain) -Controlled -Current treatment  Acetaminophen CR 650 mg every 8 hours as needed Ibuprofen 200 mg 2 tablets every 6 hours as needed  -Counseled to avoid greater than 3000 mg of acetaminophen daily  -Recommended to continue current medication  [name], PharmD, [place], [place] Pharmacist Practitioner   [facility] [phone]

## 2022-06-18 DIAGNOSIS — E1159 Type 2 diabetes mellitus with other circulatory complications: Secondary | ICD-10-CM | POA: Diagnosis not present

## 2022-06-18 DIAGNOSIS — I152 Hypertension secondary to endocrine disorders: Secondary | ICD-10-CM | POA: Diagnosis not present

## 2022-06-18 DIAGNOSIS — E1169 Type 2 diabetes mellitus with other specified complication: Secondary | ICD-10-CM | POA: Diagnosis not present

## 2022-06-18 DIAGNOSIS — E1165 Type 2 diabetes mellitus with hyperglycemia: Secondary | ICD-10-CM | POA: Diagnosis not present

## 2022-06-18 DIAGNOSIS — E785 Hyperlipidemia, unspecified: Secondary | ICD-10-CM | POA: Diagnosis not present

## 2022-06-19 ENCOUNTER — Ambulatory Visit: Payer: Self-pay

## 2022-06-19 NOTE — Telephone Encounter (Signed)
  Chief Complaint: UTI Symptoms: urinary odor and incontinence Frequency: yesterday Pertinent Negatives: NA Disposition: '[]'$ ED /'[]'$ Urgent Care (no appt availability in office) / '[x]'$ Appointment(In office/virtual)/ '[]'$  [place] [facility]/ '[]'$ Home Care/ '[]'$ Refused Recommended Disposition /'[]'$  Mobile Bus/ '[]'$  Follow-up with PCP Additional Notes: pt feels like Tyler Aas is causing UTI, pt states went to Endo yesterday and is switching to Ozempic starting Saturday. Offered OV but pt states dont know how the weather will be and she doesn't drive in the rain. Scheduled VV tomorrow at 1400 with PCP.   Summary: Possible UTI, declined appt   Pt says she is taking a medication that is giving her a UTI, she declined the offer to schedule an appt. She wants a Rx called in for her, says her PCP is aware of what pill she needs to take.  [place] ([address]), [place] [address] [place] ([place]) [place] [address] Phone: [phone] Fax: [phone]         Reason for Disposition  [1] Can't control passage of urine (i.e., urinary incontinence) AND [2] new-onset (< 2 weeks) or worsening  Answer Assessment - Initial Assessment Questions 1. SYMPTOM: "What's the main symptom you're concerned about?" (e.g., frequency, incontinence)     Incontinence and odor  2. ONSET: "When did the  sx  start?"     Yesterday  3. PAIN: "Is there any pain?" If Yes, ask: "How bad is it?" (Scale: 1-10; mild, moderate, severe)     no 4. CAUSE: "What do you think is causing the symptoms?"     UTI 5. OTHER SYMPTOMS: "Do you have any other symptoms?" (e.g., blood in urine, fever, flank pain, pain with urination)  Protocols used: Urinary Symptoms-A-AH

## 2022-06-20 ENCOUNTER — Encounter: Payer: Self-pay | Admitting: Family Medicine

## 2022-06-20 ENCOUNTER — Telehealth (INDEPENDENT_AMBULATORY_CARE_PROVIDER_SITE_OTHER): Payer: Medicare HMO | Admitting: Family Medicine

## 2022-06-20 VITALS — Ht 59.0 in

## 2022-06-20 DIAGNOSIS — N3000 Acute cystitis without hematuria: Secondary | ICD-10-CM | POA: Diagnosis not present

## 2022-06-20 DIAGNOSIS — E1165 Type 2 diabetes mellitus with hyperglycemia: Secondary | ICD-10-CM | POA: Diagnosis not present

## 2022-06-20 MED ORDER — CIPROFLOXACIN HCL 250 MG PO TABS: 250.0000 mg | ORAL_TABLET | Freq: Two times a day (BID) | ORAL | 0 refills | Status: AC

## 2022-06-20 NOTE — Assessment & Plan Note (Signed)
Continues to work with DM specialist to control glucose; has been drinking more water UTI s/s 3+ w/o blood Denies STI risk Denies recurrent yeast complaints Recommend use of ABX and continued PO intake of water  RTC for urine sampling if symptoms do not improve for urine analysis and urine culture

## 2022-06-20 NOTE — Progress Notes (Signed)
MyChart Video Visit    Virtual Visit via Video Note   This format is felt to be most appropriate for this patient at this time. Physical exam was limited by quality of the video and audio technology used for the visit.   Patient location: home Provider location: [facility] [address]  [place] [address] [place], [place] [address]   I discussed the limitations of evaluation and management by telemedicine and the availability of in person appointments. The patient expressed understanding and agreed to proceed.  Patient: Alexis Lloyd   DOB: 1961/05/26   62 y.o. Female  MRN: [id number] Visit Date: 06/20/2022  Today's healthcare provider: Gwyneth Sprout, FNP  Re Introduced to nurse practitioner role and practice setting.  All questions answered.  Discussed provider/patient relationship and expectations.   Chief Complaint  Patient presents with   Urinary Tract Infection   Subjective    HPI   This is a new problem. Episode onset: X3 days. The problem occurs every urination. The problem has been unchanged. The pain is at a severity of 0/10. The patient is experiencing no pain. There has been no fever. She is Sexually active. There is No history of pyelonephritis. Associated symptoms include urgency. She has tried nothing (pt stated she cant take OTC meds due to being a diabetic) for the symptoms. Her past medical history is significant for recurrent UTIs.    Medications: Outpatient Medications Prior to Visit  Medication Sig   albuterol (VENTOLIN HFA) 108 (90 Base) MCG/ACT inhaler Inhale 2 puffs into the lungs every 6 (six) hours as needed.   aspirin EC 81 MG tablet Take 1 tablet (81 mg total) by mouth daily. Swallow whole.   Blood Glucose Monitoring Suppl (ONETOUCH VERIO) w/Device KIT To check blood sugar once daily   conjugated estrogens (PREMARIN) vaginal cream Place 1 Applicatorful vaginally daily.   Continuous Blood Gluc Receiver (FREESTYLE LIBRE 14 DAY READER)  DEVI Use per package directions.   dapagliflozin propanediol (FARXIGA) 10 MG TABS tablet Take 1 tablet (10 mg total) by mouth daily before breakfast. Patient receives via [employer] patient assistance   glipiZIDE (GLUCOTROL) 10 MG tablet TAKE 1 TABLET BY MOUTH TWICE  DAILY BEFORE MEALS (Patient taking differently: Take 10 mg by mouth daily.)   glucose blood (ONETOUCH VERIO) test strip To check blood sugar once daily   ibuprofen (ADVIL) 200 MG tablet Take 400 mg by mouth every 6 (six) hours as needed.   insulin degludec (TRESIBA FLEXTOUCH) 100 UNIT/ML FlexTouch Pen Inject 24 Units into the skin at bedtime.   Insulin Pen Needle (PEN NEEDLES) 32G X 6 MM MISC 1 each by Does not apply route at bedtime.   Lancets (ONETOUCH ULTRASOFT) lancets Use as instructed   lisinopril-hydrochlorothiazide (ZESTORETIC) 20-12.5 MG tablet Take 2 tablets by mouth once daily (Patient taking differently: daily.)   loperamide (IMODIUM) 2 MG capsule Take 2 capsules (4 mg total) by mouth 4 (four) times daily as needed for diarrhea or loose stools.   montelukast (SINGULAIR) 10 MG tablet Take 1 tablet by mouth once daily   Multiple Vitamins-Minerals (PRESERVISION AREDS 2) CAPS Take 1 capsule by mouth daily.   pantoprazole (PROTONIX) 40 MG tablet Take 1 tablet (40 mg total) by mouth daily.   rosuvastatin (CRESTOR) 10 MG tablet Take 1 tablet by mouth once daily   spironolactone (ALDACTONE) 25 MG tablet Take 1 tablet (25 mg total) by mouth daily.   SUMAtriptan (IMITREX) 50 MG tablet Take 1 tablet (50 mg  total) by mouth every 2 (two) hours as needed for migraine. No more than '200mg'$  in 24 hours   [DISCONTINUED] ascorbic Acid (VITAMIN C) 500 MG CPCR Take 500 mg by mouth daily.   [DISCONTINUED] Cholecalciferol (VITAMIN D3) 50 MCG (2000 UT) TABS Take 2,000 Units by mouth.   [DISCONTINUED] Cyanocobalamin (VITAMIN B-12) 3000 MCG SUBL Place 1 tablet under the tongue daily at 6 (six) AM. Gummies   [DISCONTINUED] levocetirizine (XYZAL) 5 MG  tablet Take 1 tablet (5 mg total) by mouth every evening.   scopolamine (TRANSDERM-SCOP) 1 MG/3DAYS Place 1 patch (1.5 mg total) onto the skin every 3 (three) days. (Patient not taking: Reported on 06/20/2022)   No facility-administered medications prior to visit.    Review of Systems     Objective    Ht '4\' 11"'$  (1.499 m)   BMI 43.63 kg/m      Physical Exam Constitutional:      Appearance: She is obese.  Pulmonary:     Effort: Pulmonary effort is normal.  Genitourinary:    Comments: C/o urgency  Skin:    General: Skin is dry.  Neurological:     General: No focal deficit present.     Mental Status: She is alert and oriented to person, place, and time.      Assessment & Plan     Problem List Items Addressed This Visit       Genitourinary   Acute cystitis without hematuria - Primary    Continues to work with DM specialist to control glucose; has been drinking more water UTI s/s 3+ w/o blood Denies STI risk Denies recurrent yeast complaints Recommend use of ABX and continued PO intake of water  RTC for urine sampling if symptoms do not improve for urine analysis and urine culture       Return if symptoms worsen or fail to improve.    I discussed the assessment and treatment plan with the patient. The patient was provided an opportunity to ask questions and all were answered. The patient agreed with the plan and demonstrated an understanding of the instructions.   The patient was advised to call back or seek an in-person evaluation if the symptoms worsen or if the condition fails to improve as anticipated.  I provided 10 minutes of face-to-face time during this encounter discussing current symptoms, previous concerns, diabetic medication changes.  Vonna Kotyk, FNP, have reviewed all documentation for this visit. The documentation on 06/20/22 for the exam, diagnosis, procedures, and orders are all accurate and complete.  Gwyneth Sprout, [place] [phone] (phone) [phone] (fax)  [place]

## 2022-07-04 ENCOUNTER — Ambulatory Visit: Payer: Self-pay | Admitting: *Deleted

## 2022-07-04 NOTE — Patient Outreach (Signed)
  Care Coordination   Follow Up Visit Note   07/04/2022 Name: Alexis Lloyd MRN: [id number] DOB: [birth year]  Alexis Lloyd is a 62 y.o. year old female who sees Gwyneth Sprout, FNP for primary care. I spoke with  Alexis Lloyd Patient by phone today.  What matters to the patients health and wellness today?  Decreasing A1C and weight.     Goals Addressed             This Visit's Progress    RNCM: Effective Management of DM   On track    Care Coordination Interventions:  Lab Results  Component Value Date   HGBA1C 9.5 (A) 04/10/2022    Provided education to patient about basic DM disease process Reviewed medications with patient and discussed importance of medication adherence Counseled on importance of regular laboratory monitoring as prescribed Discussed plans with patient for ongoing care management follow up and provided patient with direct contact information for care management team Provided patient with written educational materials related to hypo and hyperglycemia and importance of correct treatment Reviewed scheduled/upcoming provider appointments including: need to call for PCP follow up visit.  Will see endocrinology as well in May Advised patient, providing education and rationale, to check cbg BID  and record, calling pcp or endocrinologist  for findings outside established parameters. The patient state she has not been over 150. Education on the goal of fasting of <130 and post prandial of <180. Education and support given Review of patient status, including review of consultants reports, relevant laboratory and other test results, and medications completed The patient agrees to the care coordination program and working with the [facility] for help in managing DM and other chronic conditions. The patient knows how to reach the [facility] and knows the [facility] will reach out on a regular basis.  Reviewed decrease of A1C, discussed plan to continue decreasing (diet change, exercise,  increasing water intake) Discussed recent change to Ozempic, will take 3rd dose on Sunday. Denies that she has had any complications of medication           SDOH assessments and interventions completed:  No     Care Coordination Interventions:  Yes, provided   Follow up plan: Follow up call scheduled for 4/2    Encounter Outcome:  Pt. Visit Completed   [name], RN, MSN, [place] Care Management Care Management Coordinator [phone]

## 2022-07-10 ENCOUNTER — Telehealth: Payer: Self-pay

## 2022-07-10 NOTE — Progress Notes (Signed)
Care Management & Coordination Services Pharmacy Team  Reason for Encounter: Diabetes  Contacted patient to discuss diabetes disease state.  Spoke with patient on 07/10/2022   Current antihyperglycemic regimen:  Farxiga 10 mg daily  Glipizide 10 mg daily Tresiba 24  units daily  Ozempic 2.5 mg  weekly     Patient verbally confirms she is taking the above medications as directed. Yes  What diet changes have been made to improve diabetes control?  What recent interventions/DTPs have been made to improve glycemic control:  None ID  Have there been any recent hospitalizations or ED visits since last visit with PharmD? No  Patient denies hypoglycemic symptoms, including Pale, Sweaty, Shaky, Hungry, Nervous/irritable, and Vision changes  Patient denies hyperglycemic symptoms, including blurry vision, excessive thirst, fatigue, polyuria, and weakness  How often are you checking your blood sugar?  Patient states her dexcom fell off due to her arm being wet , and she had put lotion on.Patient states it has been off about a week or so. Patient is asking how long should she wait until she replace her dexcom and if it should be place on a different spot.Notified Clinical pharmacist.  Per Clinical pharmacist, Patient should replace it right away by placing a new sensor. I would suggest trying the other arm and making sure after she cleans the site that her skin is fully dried before applying a new sensor. She will then need to go into her Dexcom meter/App to activate her new sensor. She can also request a replacement one by getting in contact with [employer] directly. They have a form she can fill out online or she can have someone give her a call. Contact information is  [phone], and website is [email or web address]    Patient verbalized understanding.  What are your blood sugars ranging?  None ID  During the week, how often does your blood glucose drop below 70?  None  ID  Are you checking your feet daily/regularly? Yes,patient denies any issues or concerns with her feet.  Patient reports she receive a letter from [employer] of being approved for [employer], but has not receive her shipment yet.   I reach out to [employer] to check on patient shipping status.Per [employer] , Ozempic is in the process of being shipped in the next two days.  Patient verbalized understanding.  Adherence Review: Is the patient currently on a STATIN medication? Yes Is the patient currently on ACE/ARB medication? Yes Does the patient have >5 day gap between last estimated fill dates? No  Chart Updates:  Recent office visits:  07/04/2022 [name] RN (Care Coordination) No medication Changes noted 06/20/2022 [name] FNP (PCP) start ciprofloxacin (CIPRO) 250 MG tablet 2 times daily  Recent consult visits:  06/18/2022 Dr. Honor Junes MD (Endocrinology) Start taking Ozempic Take 0.'25mg'$  once a week for the first 4 injections.For the 5th and 6th injection, take 0.'5mg'$ . Stay on 0.'5mg'$  once a week When starting Ozempic, cut the glipizide to one pill daily.  Hospital visits:  None in previous 6 months  Medications: Outpatient Encounter Medications as of 07/10/2022  Medication Sig   albuterol (VENTOLIN HFA) 108 (90 Base) MCG/ACT inhaler Inhale 2 puffs into the lungs every 6 (six) hours as needed.   aspirin EC 81 MG tablet Take 1 tablet (81 mg total) by mouth daily. Swallow whole.   Blood Glucose Monitoring Suppl (ONETOUCH VERIO) w/Device KIT To check blood sugar once daily   conjugated estrogens (PREMARIN) vaginal cream Place 1 Applicatorful  vaginally daily.   Continuous Blood Gluc Receiver (FREESTYLE LIBRE 14 DAY READER) DEVI Use per package directions.   dapagliflozin propanediol (FARXIGA) 10 MG TABS tablet Take 1 tablet (10 mg total) by mouth daily before breakfast. Patient receives via [employer] patient assistance   glipiZIDE (GLUCOTROL) 10 MG tablet TAKE 1 TABLET BY MOUTH  TWICE  DAILY BEFORE MEALS (Patient taking differently: Take 10 mg by mouth daily.)   glucose blood (ONETOUCH VERIO) test strip To check blood sugar once daily   ibuprofen (ADVIL) 200 MG tablet Take 400 mg by mouth every 6 (six) hours as needed.   insulin degludec (TRESIBA FLEXTOUCH) 100 UNIT/ML FlexTouch Pen Inject 24 Units into the skin at bedtime.   Insulin Pen Needle (PEN NEEDLES) 32G X 6 MM MISC 1 each by Does not apply route at bedtime.   Lancets (ONETOUCH ULTRASOFT) lancets Use as instructed   lisinopril-hydrochlorothiazide (ZESTORETIC) 20-12.5 MG tablet Take 2 tablets by mouth once daily (Patient taking differently: daily.)   loperamide (IMODIUM) 2 MG capsule Take 2 capsules (4 mg total) by mouth 4 (four) times daily as needed for diarrhea or loose stools.   montelukast (SINGULAIR) 10 MG tablet Take 1 tablet by mouth once daily   Multiple Vitamins-Minerals (PRESERVISION AREDS 2) CAPS Take 1 capsule by mouth daily.   pantoprazole (PROTONIX) 40 MG tablet Take 1 tablet (40 mg total) by mouth daily.   rosuvastatin (CRESTOR) 10 MG tablet Take 1 tablet by mouth once daily   scopolamine (TRANSDERM-SCOP) 1 MG/3DAYS Place 1 patch (1.5 mg total) onto the skin every 3 (three) days. (Patient not taking: Reported on 06/20/2022)   spironolactone (ALDACTONE) 25 MG tablet Take 1 tablet (25 mg total) by mouth daily.   SUMAtriptan (IMITREX) 50 MG tablet Take 1 tablet (50 mg total) by mouth every 2 (two) hours as needed for migraine. No more than '200mg'$  in 24 hours   No facility-administered encounter medications on file as of 07/10/2022.    Recent Relevant Labs: Lab Results  Component Value Date/Time   HGBA1C 9.5 (A) 04/10/2022 03:29 PM   HGBA1C 11.6 (A) 11/29/2021 10:48 AM   HGBA1C 11.7 (H) 08/30/2021 10:49 AM   HGBA1C 9.1 (H) 05/17/2021 09:38 AM   MICROALBUR 50 08/31/2017 10:06 AM   MICROALBUR 50 03/05/2016 02:04 PM    Kidney Function Lab Results  Component Value Date/Time   CREATININE 0.80  05/29/2022 11:11 AM   CREATININE 1.01 (H) 12/11/2021 01:50 PM   CREATININE 0.75 04/14/2014 09:25 AM   CREATININE 0.71 11/02/2013 10:07 AM   GFRNONAA 67 08/01/2020 11:27 AM   GFRNONAA >60 06/15/2020 02:48 AM   GFRNONAA >60 04/14/2014 09:25 AM   GFRNONAA >60 11/02/2013 10:07 AM   GFRAA 77 08/01/2020 11:27 AM   GFRAA >60 04/14/2014 09:25 AM   GFRAA >60 11/02/2013 10:07 AM    Care Gaps: Shingrix Vaccine COVID-19 Vaccine A1C -9.5% on 04/10/2022  Annual wellness visit in last year? Yes, last completed 05/29/2022. Last eye exam / retinopathy screening:last completed 03/05/2022. Last diabetic foot exam:last completed 11/29/2021  [facility]: Lisinopril-HCTZ 20-12.5 mg last filled on 01/01/2022 for 90 day supply at [facility]. Glipizide 10 mg last filled on 01/29/2022 for 100 day supply at [facility]. Rosuvastatin 10 mg last filled on 02/19/2022 for 90 day supply at [facility]. Farxiga 10 mg last filled 10/01/2021 for 30 day supply at [facility] (receives from [employer]).   [place] Pharmacist Assistant [phone]

## 2022-07-17 ENCOUNTER — Telehealth: Payer: Self-pay

## 2022-07-17 NOTE — Telephone Encounter (Signed)
Copied from [place] [id number]. Topic: General - Inquiry >> Jul 17, 2022 11:35 AM Erskine Squibb wrote: Reason for CRM: The patient called in requesting to speak with Cristie Hem wondering if her Ozempic has gone through yet. She also states that she missed 2 shots of her 0.25. All she has left is the 0.5 and wants to know if she should take this or go back to the 0.25 because she missed the 2. Please assist patient further

## 2022-07-18 NOTE — Progress Notes (Cosign Needed Addendum)
I reach out to [employer] to confirm when her Ozempic will be ship as patient is approved to receive patient assistance.Per [employer], patient order for  Ozempic is currently being process to be ship which can take 1 to 2 business days. Once it is process it can take 7 -10 business days before the patient receives it.  I reach out to the patient to get clarification on her questions she had for the Clinical pharmacist. Patient states she wasnt sure on how to admister the ozempic correctly.Patient states when she administer it  she wasted some the medication  in the "tube", so she use it again since she did not get the any the first time. Patient reports she she figure it out, but states she will not have enough for this Sunday.Patient is  asking if she should do 0.5 instead of 0.25 or she get any samples ?Notified Clinical pharmacist.    Per Clinical pharmacist, She can find instructions on how to inject ozempic at [email or web address].  She can also stop by the office and either I or one of the nurses can show her how to inject the medication. Each pen can adminster either the 0.25 or the 0.5 mg doses.    I would like her to inject 0.25 mg once weekly for 4 weeks, then increase to 0.5 mg weekly if she is doing ok with the medication.   I left a voice message of the clinical pharmacist response and including my contact information if the patient has any further questions.  [facility] Pharmacist Assistant [phone]

## 2022-07-18 NOTE — Telephone Encounter (Signed)
Her PAP for Ozempic and Tyler Aas was faxed to [employer] on 04/04/22, can you check on the status of her application?

## 2022-07-19 ENCOUNTER — Other Ambulatory Visit: Payer: Self-pay | Admitting: Family Medicine

## 2022-07-19 DIAGNOSIS — K219 Gastro-esophageal reflux disease without esophagitis: Secondary | ICD-10-CM

## 2022-07-21 ENCOUNTER — Telehealth: Payer: Self-pay

## 2022-07-21 DIAGNOSIS — E1165 Type 2 diabetes mellitus with hyperglycemia: Secondary | ICD-10-CM | POA: Diagnosis not present

## 2022-07-21 NOTE — Progress Notes (Signed)
Patient is asking if the clinical pharmacist can help her get samples of Ozempic 2.5 mg because all she has on hand is 0.5 mg. Notified Clinical pharmacist.   Per Clinical pharmacist, The 0.5 mg pen can be dialed up to both the 0.25 and the 0.5 mg doses.   Patient verbalized understanding and states  she was  going to her PCP office tomorrow and will  bring it with her. Patient states she will see if someone can show her how to use it .  Per Clinical pharmacist, I'd be happy to show her. I am free 8-945 or 2-3,and if i am unavailable one of the nurses can assist as well  Patient states she will come by the office at 2:00 pm on 07/21/2022.Appointment was schedule with the Clinical pharmacist.   [facility] Pharmacist Assistant [phone]

## 2022-07-22 ENCOUNTER — Ambulatory Visit: Payer: Medicare HMO

## 2022-07-22 DIAGNOSIS — E1165 Type 2 diabetes mellitus with hyperglycemia: Secondary | ICD-10-CM

## 2022-07-22 DIAGNOSIS — E1169 Type 2 diabetes mellitus with other specified complication: Secondary | ICD-10-CM

## 2022-07-23 MED ORDER — OZEMPIC (0.25 OR 0.5 MG/DOSE) 2 MG/1.5ML ~~LOC~~ SOPN: PEN_INJECTOR | SUBCUTANEOUS | Status: DC

## 2022-07-23 NOTE — Progress Notes (Signed)
Care Management & Coordination Services Pharmacy Note  07/23/2022 Name:  Alexis Lloyd MRN:  [id number] DOB:  1961-02-26  Summary: Patient presents for follow-up consult.   -Patient received in depth instruction on use of Ozempic pen today.   Recommendations/Changes made from today's visit: -START Ozempic 0.25 mg weekly for four weeks then increase to 0.5 mg weekly.  Follow up plan: CPP follow-up 3 months    Subjective: Alexis Lloyd is an 62 y.o. year old female who is a primary patient of Gwyneth Sprout, FNP.  The care coordination team was consulted for assistance with disease management and care coordination needs.    Engaged with patient by telephone for follow up visit.  Patient Care Team: Gwyneth Sprout, FNP as PCP - General (Family Medicine) Imagene Riches, CNM as Midwife (Obstetrics) Germaine Pomfret, [facility] (Pharmacist) Lonia Farber, MD as Consulting Physician (Endocrinology) [name], RN as [place] Management  Recent office visits: 05/26/22: Patient presented to [name], PA-C for cough,  04/10/22: Patient presented to [name], FNP for follow-up. A1c 9.5%.  04/03/22: Patient presented to [name], PA-C for hemoptysis. Azithromycin.    Recent consult visits: 02/06/22: Patient presented to Mee Hives (Endocrinology)   Hospital visits: None in previous 6 months   Objective:  Lab Results  Component Value Date   CREATININE 0.80 05/29/2022   BUN 17 05/29/2022   EGFR 83 05/29/2022   GFRNONAA 67 08/01/2020   GFRAA 77 08/01/2020   NA 143 05/29/2022   K 4.5 05/29/2022   CALCIUM 10.1 05/29/2022   CO2 25 05/29/2022   GLUCOSE 187 (H) 05/29/2022    Lab Results  Component Value Date/Time   HGBA1C 9.5 (A) 04/10/2022 03:29 PM   HGBA1C 11.6 (A) 11/29/2021 10:48 AM   HGBA1C 11.7 (H) 08/30/2021 10:49 AM   HGBA1C 9.1 (H) 05/17/2021 09:38 AM   MICROALBUR 50 08/31/2017 10:06 AM   MICROALBUR 50 03/05/2016  02:04 PM    Last diabetic Eye exam:  Lab Results  Component Value Date/Time   HMDIABEYEEXA No Retinopathy 03/05/2022 12:00 AM    Last diabetic Foot exam: No results found for: "HMDIABFOOTEX"   Lab Results  Component Value Date   CHOL 152 05/29/2022   HDL 28 (L) 05/29/2022   LDLCALC 98 05/29/2022   TRIG 142 05/29/2022   CHOLHDL 5.4 (H) 05/29/2022       Latest Ref Rng & Units 05/29/2022   11:11 AM 05/17/2021    9:38 AM 08/01/2020   11:27 AM  Hepatic Function  Total Protein 6.0 - 8.5 g/dL 7.2  7.0  7.1   Albumin 3.9 - 4.9 g/dL 4.2  4.3  4.3   AST 0 - 40 IU/L 5  6  8   $ ALT 0 - 32 IU/L 13  14  13   $ Alk Phosphatase 44 - 121 IU/L 102  103  64   Total Bilirubin 0.0 - 1.2 mg/dL 0.3  0.2  0.3     Lab Results  Component Value Date/Time   TSH 1.990 05/29/2022 11:11 AM   TSH 2.190 09/16/2018 11:12 AM       Latest Ref Rng & Units 05/29/2022   11:11 AM 05/17/2021    9:38 AM 08/01/2020   11:27 AM  CBC  WBC 3.4 - 10.8 x10E3/uL 8.2  7.2  6.5   Hemoglobin 11.1 - 15.9 g/dL 11.9  11.6  11.0   Hematocrit 34.0 - 46.6 % 36.0  34.6  33.2   Platelets 150 - 450 x10E3/uL 302  315  243     Lab Results  Component Value Date/Time   VITAMINB12 461 08/31/2017 10:50 AM   VITAMINB12 468 01/15/2017 03:50 PM    Clinical ASCVD: No  The 10-year ASCVD risk score (Arnett DK, et al., 2019) is: 19.1%   Values used to calculate the score:     Age: 4 years     Sex: Female     Is Non-Hispanic African American: Yes     Diabetic: Yes     Tobacco smoker: No     Systolic Blood Pressure: [id number]     Is BP treated: Yes     HDL Cholesterol: 28 mg/dL     Total Cholesterol: 152 mg/dL       06/20/2022    1:43 PM 05/29/2022   10:57 AM 04/10/2022    3:17 PM  Depression screen PHQ 2/9  Decreased Interest 0 0 0  Down, Depressed, Hopeless 0 0 0  PHQ - 2 Score 0 0 0  Altered sleeping 0 2 0  Tired, decreased energy 0 2 0  Change in appetite 0 0 0  Feeling bad or failure about yourself  0 0 0  Trouble  concentrating 0 0 0  Moving slowly or fidgety/restless 0 0 0  Suicidal thoughts 0 0 0  PHQ-9 Score 0 4 0  Difficult doing work/chores Not difficult at all Not difficult at all Not difficult at all    Social History   Tobacco Use  Smoking Status Former   Packs/day: 0.25   Years: 21.00   Total pack years: 5.25   Types: Cigarettes   Quit date: 06/08/1997   Years since quitting: 25.1  Smokeless Tobacco Never   BP Readings from Last 3 Encounters:  05/29/22 (!) 113/58  05/26/22 124/64  04/10/22 127/71   Pulse Readings from Last 3 Encounters:  05/29/22 89  05/26/22 90  04/10/22 92   Wt Readings from Last 3 Encounters:  05/29/22 216 lb (98 kg)  05/26/22 216 lb 12.8 oz (98.3 kg)  04/10/22 213 lb (96.6 kg)   BMI Readings from Last 3 Encounters:  06/20/22 43.63 kg/m  05/29/22 42.90 kg/m  05/26/22 43.06 kg/m    Allergies  Allergen Reactions   Allegra [Fexofenadine] Nausea And Vomiting   Avelox [Moxifloxacin] Other (See Comments)    Hallucinations   Bactrim [Sulfamethoxazole-Trimethoprim] Other (See Comments)    unknown   Etodolac Nausea Only   Fish-Derived Products Nausea And Vomiting    With "seafood"   Penicillins Diarrhea and Nausea And Vomiting   Shellfish Allergy Nausea Only   Sulfa Antibiotics Other (See Comments)    unknown   Macrobid [Nitrofurantoin Monohyd Macro] Other (See Comments)    Constipation and globus hystericus.    Medications Reviewed Today     Reviewed by Gwyneth Sprout, FNP (Family Nurse Practitioner) on 06/20/22 at 1411  Med List Status: <None>   Medication Order Taking? Sig Documenting Provider Last Dose Status Informant  albuterol (VENTOLIN HFA) 108 (90 Base) MCG/ACT inhaler [id number] Yes Inhale 2 puffs into the lungs every 6 (six) hours as needed. [name], PA-C Taking Active   aspirin EC 81 MG tablet [id number] Yes Take 1 tablet (81 mg total) by mouth daily. Swallow whole. Mar Daring, PA-C Taking Active   Blood Glucose  Monitoring Suppl [facility] VERIO) w/Device Drucie Opitz [id number] Yes To check blood sugar once daily Mar Daring, [place] Taking Active   ciprofloxacin (  CIPRO) 250 MG tablet [id number] Yes Take 1 tablet (250 mg total) by mouth 2 (two) times daily for 5 days. Gwyneth Sprout, FNP  Active   conjugated estrogens (PREMARIN) vaginal cream [id number] Yes Place 1 Applicatorful vaginally daily. Gwyneth Sprout, FNP Taking Active   Continuous Blood Gluc Receiver (FREESTYLE LIBRE [address] READER) DEVI [id number] Yes Use per package directions. Myles Gip, DO Taking Active   dapagliflozin propanediol (FARXIGA) 10 MG TABS tablet [id number] Yes Take 1 tablet (10 mg total) by mouth daily before breakfast. Patient receives via AZ&ME patient assistance Gwyneth Sprout, FNP Taking Active   glipiZIDE (GLUCOTROL) 10 MG tablet [id number] Yes TAKE 1 TABLET BY MOUTH TWICE  DAILY BEFORE MEALS  Patient taking differently: Take 10 mg by mouth daily.   Gwyneth Sprout, FNP Taking Active   glucose blood [facility] VERIO) test strip [id number] Yes To check blood sugar once daily Myles Gip, DO Taking Active   ibuprofen (ADVIL) 200 MG tablet [id number] Yes Take 400 mg by mouth every 6 (six) hours as needed. [provider] Taking Active   insulin degludec (TRESIBA FLEXTOUCH) 100 UNIT/ML FlexTouch Pen [id number] Yes Inject 24 Units into the skin at bedtime. [provider] Taking Active   Insulin Pen Needle (PEN NEEDLES) 32G X 6 MM MISC [id number] Yes 1 each by Does not apply route at bedtime. Gwyneth Sprout, FNP Taking Active   Lancets [facility] ULTRASOFT) lancets [id number] Yes Use as instructed Myles Gip, DO Taking Active   lisinopril-hydrochlorothiazide (ZESTORETIC) 20-12.5 MG tablet [id number] Yes Take 2 tablets by mouth once daily  Patient taking differently: daily.   Gwyneth Sprout, FNP Taking Active   loperamide (IMODIUM) 2 MG capsule [id number] Yes Take 2 capsules (4 mg total) by mouth 4 (four) times  daily as needed for diarrhea or loose stools. Gwyneth Sprout, FNP Taking Active   montelukast (SINGULAIR) 10 MG tablet [id number] Yes Take 1 tablet by mouth once daily Gwyneth Sprout, FNP Taking Active   Multiple Vitamins-Minerals (PRESERVISION AREDS 2) CAPS [id number] Yes Take 1 capsule by mouth daily. [provider] Taking Active   pantoprazole (PROTONIX) 40 MG tablet [id number] Yes Take 1 tablet (40 mg total) by mouth daily. Gwyneth Sprout, FNP Taking Active   rosuvastatin (CRESTOR) 10 MG tablet [id number] Yes Take 1 tablet by mouth once daily Gwyneth Sprout, FNP Taking Active   scopolamine (TRANSDERM-SCOP) 1 MG/3DAYS [id number] No Place 1 patch (1.5 mg total) onto the skin every 3 (three) days.  Patient not taking: Reported on 06/20/2022   Gwyneth Sprout, FNP Not Taking Active   spironolactone (ALDACTONE) 25 MG tablet [id number] Yes Take 1 tablet (25 mg total) by mouth daily. Gwyneth Sprout, FNP Taking Active   SUMAtriptan (IMITREX) 50 MG tablet [id number] Yes Take 1 tablet (50 mg total) by mouth every 2 (two) hours as needed for migraine. No more than 23m in 24 hours BMar Daring PVermontTaking Active Other            Patient Active Problem List   Diagnosis Date Noted   Acute cystitis without hematuria 06/20/2022   OAB (overactive bladder) 05/29/2022   Encounter for subsequent annual wellness visit (AWV) in Medicare patient 05/29/2022   Acute non-recurrent maxillary sinusitis 05/29/2022   Itching in the vaginal area 11/29/2021   Motion sickness 11/29/2021   Acquired deformity of left toe 11/29/2021   Vaginal dryness, menopausal 10/10/2021   Vaginal discharge 10/10/2021  Acute cystitis with hematuria 10/10/2021   Urinary frequency 08/30/2021   Urge incontinence of urine 08/30/2021   Vaginal yeast infection 08/30/2021   Annual physical exam 05/17/2021   Screening for cervical cancer 05/17/2021   Encounter for screening mammogram for malignant neoplasm of breast  05/17/2021   Elevated LDL cholesterol level 05/17/2021   Iron deficiency anemia secondary to inadequate dietary iron intake 05/17/2021   Hyperlipidemia associated with type 2 diabetes mellitus ([place]) 05/17/2021   Uncontrolled type 2 diabetes mellitus with hyperglycemia, with long-term current use of insulin ([place]) 05/17/2021   Need for shingles vaccine 05/17/2021   Routine screening for STI (sexually transmitted infection) 05/17/2021   Itchy skin 05/17/2021   Dry skin dermatitis 05/17/2021   Pain due to onychomycosis of toenails of both feet [id number]   History of Helicobacter pylori infection 11/05/2017   Heartburn    Diverticulosis of large intestine without diverticulitis    Warts of foot 06/30/2017   Hallux rigidus of left foot 01/28/2017   Hallux rigidus, right foot 01/28/2017   Pes planus 01/28/2017   Diabetic neuropathy ([place]) 11/18/2014   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   Hypercholesteremia 11/18/2014   Hypertension associated with diabetes ([place]) 11/18/2014   Personal history of breast cancer 10/04/2014    Immunization History  Administered Date(s) Administered   PFIZER Comirnaty(Gray Top)Covid-19 Tri-Sucrose Vaccine 08/15/2020   PFIZER(Purple Top)SARS-COV-2 Vaccination 07/31/2019, 09/13/2019   Pfizer Covid-19 Vaccine Bivalent Booster 46yr & up 03/28/2021   Pneumococcal Polysaccharide-23 06/22/2009, 08/06/2015   Tdap 08/31/2017   Zoster Recombinat (Shingrix) 08/15/2021     Compliance/Adherence/Medication fill history: Care Gaps: Shingrix Vaccine COVID-19 Vaccine Influenza Vaccine;O A1C   Star-Rating Drugs: Lisinopril-HCTZ 20-12.5 mg [facility] Glip[facility]ide 10 mg last [facility] Rosuvastatin 10 mg [facility]  SDOH:  (Social Determinants of Health) assessments and interventions performed: Yes SDOH Interventions     Flowsheet Row Office Visit from 04/03/2022 in [place] 03/03/2022 in TKodiak StationManagement from 04/22/2021 in C[place] from 01/25/2021 in CPhoenic[place]Management from 10/30/2020 in CMagnoliaManagement from 07/23/2020 in CBenbrookInterventions        Food Insecurity Interventions -- Intervention Not Indicated -- -- -- --  Housing Interventions -- Intervention Not Indicated -- -- -- --  Transportation Interventions -- Intervention Not Indicated -- -- -- Intervention Not Indicated  Utilities Interventions -- Intervention Not Indicated -- -- -- --  Alcohol Usage Interventions -- Intervention Not Indicated (Score <7) -- -- -- --  Depression Interventions/Treatment  PHQ2-9 Score <4 Follow-up Not Indicated -- -- -- -- --  Financial Strain Interventions -- Other (Comment)  [PAP assistant] Other (Comment)  [PAP] Intervention Not Indicated Other (Comment)  [PAP] Other (Comment)  [PAP]  Stress Interventions -- Intervention Not Indicated -- -- -- --  Social Connections Interventions -- Intervention Not Indicated -- -- -- --      SDOH Screenings   Food Insecurity: No Food Insecurity (03/03/2022)  Housing: Low Risk  (03/03/2022)  Transportation Needs: No Transportation Needs (03/03/2022)  Utilities: Not At Risk (03/03/2022)  Alcohol Screen: Low Risk  (04/10/2022)  Depression (PHQ2-9): Low Risk  (06/20/2022)  Financial Resource Strain: Low Risk  (  03/03/2022)  Physical Activity: Insufficiently Active (04/13/2017)  Social Connections: Socially Integrated (03/03/2022)  Stress: No Stress Concern Present (03/03/2022)  Tobacco Use: Medium Risk (06/20/2022)    Medication Assistance:  Joni Reining obtained through [employer] medication  assistance program.  Enrollment ends TBD  Medication Access: Within the past 30 days, how often has patient missed a dose of medication? None Is a pillbox or other method used to improve adherence? Yes  Factors that may affect medication adherence? financial need Are meds synced by current pharmacy? No  Are meds delivered by current pharmacy? Yes  Does patient experience delays in picking up medications due to transportation concerns? No   Upstream Services Reviewed: Is patient disadvantaged to use [facility] Pharmacy?: Yes  Current Rx insurance plan: [facility] Name and location of Current pharmacy:  [facility] [address]), [place]) [place] [address] Phone: [phone] Fax: [phone]  [facility] ([place], [place] [facility] [address] [place] [address] Phone: [phone] Fax: [phone]  [place], [place] [place] [address] Phone: [phone] Fax: [place], [place]. [place] [address] Phone: [phone] Fax: [phone]  [facility] Pharmacy services reviewed with patient today?: No  Patient requests to transfer care to [facility] Pharmacy?: No  Reason patient declined to change pharmacies: Disadvantaged due to insurance/mail order  Assessment/Plan  Hypertension (BP goal <140/90) -Controlled -Current treatment: Lisinopril-HCTZ 20-12.5 mg 2 tablets daily  Spironolactone 25 mg daily  -Medications previously tried: Amlodipine, diltiazem (hypotension)   -Current home readings:   -Current dietary habits: Limiting fried food. Eating more vegetables  -Current exercise habits: PT weekly -Denies hypotensive/hypertensive symptoms.  -Continue current medications  Hyperlipidemia: (LDL goal <  70) -uncontrolled -Current treatment: Rosuvastatin 10 mg daily -Medications previously tried: Pravastatin, Lovastatin Importance of limiting foods high in cholesterol; -Recommended rechecking lipid panel at PCP follow-up.   Diabetes (A1c goal <8%) -Managed by Mee Hives -Uncontrolled -Current medications: Farxiga 10 mg daily  Glipizide 10 mg twice daily Tresiba 24 daily  -Medications previously tried: Januvia, Metformin (diarrhea), Pioglitazone ([place]), Trulicity, Rybelsus (ineffective) -Current home glucose readings fasting glucose:  -Reports hypoglycemic symptoms: afternoon when she hadn't had lunch. Felt confused  -Drinking water and ice tea.  -Patient received in depth instruction on use of Ozempic pen today.  -START Ozempic 0.25 mg weekly for four weeks then increase to 0.5 mg weekly.   Asthma (Goal: control symptoms and prevent exacerbations) -controlled -Current treatment  Ventolin HFA 108 mcg/act 2 puff every 6 hours as needed  Montelukast 10 mg daily  -Medications previously tried: NA  -Pulmonary function testing: NA -Exacerbations requiring treatment in last 6 months: Yes, due to recent covid infection -Patient denies consistent use of maintenance inhaler -Frequency of rescue inhaler use: twice weekly -Counseled on Proper inhaler technique; When to use rescue inhaler -Recommended to continue current medication  GERD (Goal: prevent symptoms of heartburn and reflux) -controlled -Current treatment  Pantoprazole 40 mg daily  -Medications previously tried: NA  -Recommended to continue current medication  Osteoarthritis (Goal: minimize arthritis pain) -Controlled -Current treatment  Acetaminophen CR 650 mg every 8 hours as needed Ibuprofen 200 mg 2 tablets every 6 hours as needed  -Counseled to avoid greater than 3000 mg of  acetaminophen daily  -Recommended to continue current medication  [name], PharmD, [place], [place] Pharmacist Practitioner   [facility] [phone]

## 2022-07-24 ENCOUNTER — Other Ambulatory Visit: Payer: Self-pay | Admitting: Family Medicine

## 2022-08-07 ENCOUNTER — Telehealth: Payer: Self-pay

## 2022-08-07 NOTE — Telephone Encounter (Signed)
Copied from [place]. Topic: General - Other >> Aug 07, 2022  4:29 PM [place] B wrote: Reason for CRM: Dishana from [employer] the state license for Dr Rollene Rotunda show expired . The have [id number]. Patient is trying to get reenrolled in their program. Please call back to assist

## 2022-08-08 ENCOUNTER — Telehealth: Payer: Self-pay

## 2022-08-08 NOTE — Telephone Encounter (Signed)
Patient's application was approved and her  Tyler Aas and Larna Daughters has been shipped.  ID: [id number]

## 2022-08-08 NOTE — Progress Notes (Signed)
Care Management & Coordination Services Pharmacy Team  Reason for Encounter: Diabetes  Contacted patient to discuss diabetes disease state.  Spoke with patient on 08/08/2022   Current antihyperglycemic regimen:  Farxiga 10 mg daily  Glipizide 10 mg daily Tresiba 24 bedtime Ozempic 0.25 mg weekly for four weeks then increase to 0.5 mg weekly.     Patient verbally confirms she is taking the above medications as directed. Yes  Patient states she has one more dose of 0.25 of Ozempic then she will increase to 0.5 mg.  What diet changes have been made to improve diabetes control?  What recent interventions/DTPs have been made to improve glycemic control:  Patient states she feels better since she started Ozempic,and denies any side effects at this time.  Have there been any recent hospitalizations or ED visits since last visit with PharmD? No  Patient denies hypoglycemic symptoms, including Pale, Sweaty, Shaky, Hungry, Nervous/irritable, and Vision changes  Patient denies hyperglycemic symptoms, including blurry vision, excessive thirst, fatigue, polyuria, and weakness  How often are you checking your blood sugar? once daily  What are your blood sugars ranging?  Patient reports her blood sugar has been ranging around 140-150, and the lowest has been 93.  During the week, how often does your blood glucose drop below 70? Never  Are you checking your feet daily/regularly? Yes, patient denies any issue regarding her feet at this time.   Patient assistance  Patient states [facility] inform her they are unable to send her shipment due to the provider state license number is expired.  I reach out to [employer] to give them the provider information, and to see if there is anything further that needs to be done to get her medication shipped.  Per [employer], they need provider information regarding expiration date for Dr Honor Junes.I inform them Dr. Honor Junes states license renewal  date is 08/07/2023.Per [employer], patient was approved and patient should receive Ozempic and Tresbia 10-14 business days after 09/02/2022.I request if they could expedite her shipment since she is almost out of Ozempic and [place]. Per [employer], they will send the request to expedite ,and they will reach out to Dr. Honor Junes office to inform them if they are able to.Notified Clinical pharmacist, and ask if we had any samples of Ozempic or [place] since the patient only has one week left on hand.   Per Clinical pharmacist, If she hasn't gotten her delivery by next week then yes we can get her set up with samples if we have any at the clinic.   Patient verbalized understanding, and stated she administer her medicine on Tuesday, and she will return my call if she does not receive her shipment by the end of next week.   Adherence Review: Is the patient currently on a STATIN medication? Yes Is the patient currently on ACE/ARB medication? Yes Does the patient have >5 day gap between last estimated fill dates? No  Care Gaps: Shingrix Vaccine COVID-19 Vaccine A1C -9.5% on 04/10/2022  Annual wellness visit in last year? Yes, last completed 05/29/2022. Last eye exam / retinopathy screening:last completed 03/05/2022. Last diabetic foot exam:last completed 11/29/2021   Star Rating Drugs: Lisinopril-HCTZ 20-12.5 mg last filled on 04/09/2022 for 90 day supply at [facility]. Glipizide 10 mg last filled on 04/29/2022 for 100 day supply at [facility]. Rosuvastatin 10 mg last filled on 05/23/2022 for 90 day supply at [facility]. Farxiga 10 mg last filled 10/01/2021 for 30 day supply at [facility]armacy (receives from [employer]).   Chart Updates:  Recent office visits:  None ID  Recent consult visits:  None ID  Hospital visits:  None in previous 6 months  Medications: Outpatient Encounter Medications as of 08/08/2022  Medication Sig   albuterol (VENTOLIN HFA) 108 (90 Base)  MCG/ACT inhaler Inhale 2 puffs into the lungs every 6 (six) hours as needed.   aspirin EC 81 MG tablet Take 1 tablet (81 mg total) by mouth daily. Swallow whole.   Blood Glucose Monitoring Suppl (ONETOUCH VERIO) w/Device KIT To check blood sugar once daily   conjugated estrogens (PREMARIN) vaginal cream Place 1 Applicatorful vaginally daily.   Continuous Blood Gluc Receiver (FREESTYLE LIBRE 14 DAY READER) DEVI Use per package directions.   dapagliflozin propanediol (FARXIGA) 10 MG TABS tablet Take 1 tablet (10 mg total) by mouth daily before breakfast. Patient receives via [employer] patient assistance   glipiZIDE (GLUCOTROL) 10 MG tablet TAKE 1 TABLET BY MOUTH TWICE  DAILY BEFORE MEALS (Patient taking differently: Take 10 mg by mouth daily.)   glucose blood (ONETOUCH VERIO) test strip To check blood sugar once daily   ibuprofen (ADVIL) 200 MG tablet Take 400 mg by mouth every 6 (six) hours as needed.   insulin degludec (TRESIBA FLEXTOUCH) 100 UNIT/ML FlexTouch Pen Inject 24 Units into the skin at bedtime.   Insulin Pen Needle (PEN NEEDLES) 32G X 6 MM MISC 1 each by Does not apply route at bedtime.   Lancets (ONETOUCH ULTRASOFT) lancets Use as instructed   lisinopril-hydrochlorothiazide (ZESTORETIC) 20-12.5 MG tablet Take 2 tablets by mouth once daily (Patient taking differently: daily.)   loperamide (IMODIUM) 2 MG capsule Take 2 capsules (4 mg total) by mouth 4 (four) times daily as needed for diarrhea or loose stools.   montelukast (SINGULAIR) 10 MG tablet Take 1 tablet by mouth once daily   Multiple Vitamins-Minerals (PRESERVISION AREDS 2) CAPS Take 1 capsule by mouth daily.   pantoprazole (PROTONIX) 40 MG tablet Take 1 tablet by mouth once daily   rosuvastatin (CRESTOR) 10 MG tablet Take 1 tablet by mouth once daily   scopolamine (TRANSDERM-SCOP) 1 MG/3DAYS Place 1 patch (1.5 mg total) onto the skin every 3 (three) days. (Patient not taking: Reported on 06/20/2022)   Semaglutide,0.25 or 0.'5MG'$ /DOS,  (OZEMPIC, 0.25 OR 0.5 MG/DOSE,) 2 MG/1.5ML SOPN Inject 0.25 mg into the skin weekly for four weeks, the increase to 0.5 mg weekly.   spironolactone (ALDACTONE) 25 MG tablet Take 1 tablet (25 mg total) by mouth daily.   SUMAtriptan (IMITREX) 50 MG tablet Take 1 tablet (50 mg total) by mouth every 2 (two) hours as needed for migraine. No more than '200mg'$  in 24 hours   No facility-administered encounter medications on file as of 08/08/2022.    Recent Relevant Labs: Lab Results  Component Value Date/Time   HGBA1C 9.5 (A) 04/10/2022 03:29 PM   HGBA1C 11.6 (A) 11/29/2021 10:48 AM   HGBA1C 11.7 (H) 08/30/2021 10:49 AM   HGBA1C 9.1 (H) 05/17/2021 09:38 AM   MICROALBUR 50 08/31/2017 10:06 AM   MICROALBUR 50 03/05/2016 02:04 PM    Kidney Function Lab Results  Component Value Date/Time   CREATININE 0.80 05/29/2022 11:11 AM   CREATININE 1.01 (H) 12/11/2021 01:50 PM   CREATININE 0.75 04/14/2014 09:25 AM   CREATININE 0.71 11/02/2013 10:07 AM   GFRNONAA 67 08/01/2020 11:27 AM   GFRNONAA >60 06/15/2020 02:48 AM   GFRNONAA >60 04/14/2014 09:25 AM   GFRNONAA >60 11/02/2013 10:07 AM   GFRAA  77 08/01/2020 11:27 AM   GFRAA >60 04/14/2014 09:25 AM   GFRAA >60 11/02/2013 10:07 AM    [place] Clinical Pharmacist Assistant [phone]

## 2022-08-19 DIAGNOSIS — E1165 Type 2 diabetes mellitus with hyperglycemia: Secondary | ICD-10-CM | POA: Diagnosis not present

## 2022-08-21 ENCOUNTER — Telehealth: Payer: Self-pay

## 2022-08-21 NOTE — Progress Notes (Signed)
Patient is asking if her PCP office has any samples of Ozempic,and she gave her last dose yesterday which was 2.5 mg and not 0.5 mg because that is all she had.  I reach out to [employer] to check on the patient shipment as I was inform on 08/08/2022 it was going to be expedited.Per [employer] representative,they do not expedite orders, and unsure why the other representative inform me this.They do not offer vouchers at this time, but patient order is processing and it will be ship 10-14 business days after 09/02/2022.   Notified Clinical pharmacist of above.   Per Clinical pharmacist, samples of Ozempic will be ready for her to pick up at her PCP office.  Patient is asking if it will be okay for her to go back to 0.5 mg as she had to administer 2.5 mg yesterday since that is all she had.Notified Clinical pharmacist.  Per Clinical pharmacist, yes that is fine to increase to 0.5 mg.   Patient verbalized understanding.  [place] Pharmacist Assistant [phone]

## 2022-08-26 ENCOUNTER — Telehealth: Payer: Self-pay | Admitting: Family Medicine

## 2022-08-26 NOTE — Telephone Encounter (Signed)
Pt needs a new script for  freestyle Libre sensors sent to [facility] asap/ they usually send it to her home/ please advise

## 2022-08-27 NOTE — Telephone Encounter (Signed)
Called [facility] and they state that patient's account is updated and they do not need a new prescription. They will reach out to her in the next 2 days to confirm shipment with her.   I left a detailed message for patient advising as above.

## 2022-08-30 ENCOUNTER — Other Ambulatory Visit: Payer: Self-pay | Admitting: Family Medicine

## 2022-08-30 DIAGNOSIS — J40 Bronchitis, not specified as acute or chronic: Secondary | ICD-10-CM

## 2022-09-01 NOTE — Telephone Encounter (Signed)
Pt is calling back to f/u on medication refill. Pt stated she is out of her medication.   Please advise.

## 2022-09-01 NOTE — Telephone Encounter (Signed)
Requested Prescriptions  Pending Prescriptions Disp Refills   rosuvastatin (CRESTOR) 10 MG tablet [Pharmacy Med Name: Rosuvastatin Calcium 10 MG Oral Tablet] 90 tablet 0    Sig: Take 1 tablet by mouth once daily     Cardiovascular:  Antilipid - Statins 2 Failed - 09/01/2022 11:06 AM      Failed - Lipid Panel in normal range within the last 12 months    Cholesterol, Total  Date Value Ref Range Status  05/29/2022 152 100 - 199 mg/dL Final   LDL Chol Calc (NIH)  Date Value Ref Range Status  05/29/2022 98 0 - 99 mg/dL Final   HDL  Date Value Ref Range Status  05/29/2022 28 (L) >39 mg/dL Final   Triglycerides  Date Value Ref Range Status  05/29/2022 142 0 - 149 mg/dL Final         Passed - Cr in normal range and within 360 days    Creatinine  Date Value Ref Range Status  04/14/2014 0.75 0.60 - 1.30 mg/dL Final   Creatinine, Ser  Date Value Ref Range Status  05/29/2022 0.80 0.57 - 1.00 mg/dL Final         Passed - Patient is not pregnant      Passed - Valid encounter within last 12 months    Recent Outpatient Visits           2 months ago Acute cystitis without hematuria   [place] [name] T, FNP   3 months ago Acute cough   [place], [place], PA-C   4 months ago Uncontrolled type 2 diabetes mellitus with hyperglycemia, with long-term current use of insulin [facility])   [place] [name] T, FNP   5 months ago Hemoptysis   [place], [place], PA-C   8 months ago Uncontrolled type 2 diabetes mellitus with hyperglycemia, with long-term current use of insulin ([place])   [place], Alison M, DO               montelukast (SINGULAIR) 10 MG tablet [Pharmacy Med Name: Montelukast Sodium 10 MG Oral Tablet] 90 tablet 0    Sig: Take 1 tablet by mouth once daily     Pulmonology:  Leukotriene Inhibitors Passed  - 09/01/2022 11:06 AM      Passed - Valid encounter within last 12 months    Recent Outpatient Visits           2 months ago Acute cystitis without hematuria   [place] [name] T, FNP   3 months ago Acute cough   [place], [place], PA-C   4 months ago Uncontrolled type 2 diabetes mellitus with hyperglycemia, with long-term current use of insulin [facility])   [place] [name] T, FNP   5 months ago Hemoptysis   [place], [place], PA-C   8 months ago Uncontrolled type 2 diabetes mellitus with hyperglycemia, with long-term current use of insulin [facility])   [place], [place], [place]

## 2022-09-09 ENCOUNTER — Ambulatory Visit: Payer: Self-pay | Admitting: *Deleted

## 2022-09-09 NOTE — Patient Outreach (Signed)
  Care Coordination   Follow Up Visit Note   09/09/2022 Name: Alexis Lloyd MRN: [id number] DOB: 05/12/1961  Alexis Lloyd is a 62 y.o. year old female who sees Gwyneth Sprout, FNP for primary care. I spoke with  Alexis Lloyd by phone today.  What matters to the patients health and wellness today?  Continuing Ozempic as instructed without issues and decreasing A1C.    Goals Addressed             This Visit's Progress    RNCM: Effective Management of DM   On track    Care Coordination Interventions:  Lab Results  Component Value Date   HGBA1C 9.5 (A) 04/10/2022    Provided education to Lloyd about basic DM disease process Reviewed medications with Lloyd and discussed importance of medication adherence Counseled on importance of regular laboratory monitoring as prescribed.  Will have new A1C done in May with endocrinology Discussed plans with Lloyd for ongoing care management follow up and provided Lloyd with direct contact information for care management team Provided Lloyd with written educational materials related to hypo and hyperglycemia and importance of correct treatment.  State she has had low of 69, had peanut butter and coke to increase.   Reviewed scheduled/upcoming provider appointments including: need to call for PCP follow up visit in June.  Will see endocrinology on 5/15 and pharmacy team on 5/17 Advised Lloyd, providing education and rationale, to check cbg BID  and record, calling pcp or endocrinologist  for findings outside established parameters. The Lloyd state she today it was 143, much better then the previous 200s prior to medication changes. Education on the goal of fasting of <130 and post prandial of <180. Education and support given Review of Lloyd status, including review of consultants reports, relevant laboratory and other test results, and medications completed.  The Lloyd agrees to the care coordination program and working with the  [facility] for help in managing DM and other chronic conditions. The Lloyd knows how to reach the [facility] and knows the [facility] will reach out on a regular basis.  Reviewed decrease of A1C, discussed plan to continue decreasing (diet change, exercise, increasing water intake). Discussed recent change to Ozempic, continue to be without  GI symptoms           SDOH assessments and interventions completed:  No     Care Coordination Interventions:  Yes, provided   Interventions Today    Flowsheet Row Most Recent Value  Chronic Disease   Chronic disease during today's visit Diabetes  General Interventions   General Interventions Discussed/Reviewed General Interventions Reviewed, Labs, Doctor Visits  Labs Hgb A1c every 6 months  Doctor Visits Discussed/Reviewed Doctor Visits Reviewed, Specialist, PCP  PCP/Specialist Visits Compliance with follow-up visit  Education Interventions   Education Provided Provided Education  Provided Verbal Education On Nutrition, When to see the doctor, Medication, Blood Sugar Monitoring        Follow up plan: Follow up call scheduled for 5/20    Encounter Outcome:  Pt. Visit Completed   Alexis David, RN, MSN, [place] Care Management Care Management Coordinator [phone]

## 2022-09-19 DIAGNOSIS — E1165 Type 2 diabetes mellitus with hyperglycemia: Secondary | ICD-10-CM | POA: Diagnosis not present

## 2022-09-22 ENCOUNTER — Telehealth: Payer: Self-pay

## 2022-09-22 ENCOUNTER — Other Ambulatory Visit: Payer: Self-pay | Admitting: Family Medicine

## 2022-09-22 DIAGNOSIS — B079 Viral wart, unspecified: Secondary | ICD-10-CM

## 2022-09-22 NOTE — Telephone Encounter (Signed)
Copied from CRM [id number]. Topic: Referral - Request for Referral >> Sep 22, 2022 10:46 AM Franchot Heidelberg wrote: Has patient seen PCP for this complaint? No. *If NO, is insurance requiring patient see PCP for this issue before PCP can refer them? Referral for which specialty: Dermatology  Preferred provider/office: [facility]  Reason for referral: Has a wart on her index finger that is sore and inflamed. Getting worse.

## 2022-10-07 ENCOUNTER — Other Ambulatory Visit: Payer: Self-pay | Admitting: Family Medicine

## 2022-10-07 DIAGNOSIS — I10 Essential (primary) hypertension: Secondary | ICD-10-CM

## 2022-10-07 DIAGNOSIS — K219 Gastro-esophageal reflux disease without esophagitis: Secondary | ICD-10-CM

## 2022-10-08 NOTE — Telephone Encounter (Signed)
Requested Prescriptions  Pending Prescriptions Disp Refills   lisinopril-hydrochlorothiazide (ZESTORETIC) 20-12.5 MG tablet [Pharmacy Med Name: Lisinopril-hydroCHLOROthiazide 20-12.5 MG Oral Tablet] 180 tablet 0    Sig: Take 2 tablets by mouth once daily     Cardiovascular:  ACEI + Diuretic Combos Passed - 10/07/2022 12:12 PM      Passed - Na in normal range and within 180 days    Sodium  Date Value Ref Range Status  05/29/2022 143 134 - 144 mmol/L Final  04/14/2014 141 136 - 145 mmol/L Final         Passed - K in normal range and within 180 days    Potassium  Date Value Ref Range Status  05/29/2022 4.5 3.5 - 5.2 mmol/L Final  04/14/2014 4.0 3.5 - 5.1 mmol/L Final         Passed - Cr in normal range and within 180 days    Creatinine  Date Value Ref Range Status  04/14/2014 0.75 0.60 - 1.30 mg/dL Final   Creatinine, Ser  Date Value Ref Range Status  05/29/2022 0.80 0.57 - 1.00 mg/dL Final         Passed - eGFR is 30 or above and within 180 days    EGFR (African American)  Date Value Ref Range Status  04/14/2014 >60 >60mL/min Final  11/02/2013 >60  Final   GFR calc Af Amer  Date Value Ref Range Status  08/01/2020 77 >59 mL/min/1.73 Final    Comment:    **In accordance with recommendations from the NKF-ASN Task force,**   [facility] is in the process of updating its eGFR calculation to the   2021 CKD-EPI creatinine equation that estimates kidney function   without a race variable.    EGFR (Non-African Amer.)  Date Value Ref Range Status  04/14/2014 >60 >88mL/min Final    Comment:    eGFR values <3mL/min/1.73 m2 may be an indication of chronic kidney disease (CKD). Calculated eGFR, using the MRDR Study equation, is useful in  patients with stable renal function. The eGFR calculation will not be reliable in acutely ill patients when serum creatinine is changing rapidly. It is not useful in patients on dialysis. The eGFR calculation may not be applicable to patients  at the low and high extremes of body sizes, pregnant women, and vegetarians.   11/02/2013 >60  Final    Comment:    eGFR values <66mL/min/1.73 m2 may be an indication of chronic kidney disease (CKD). Calculated eGFR is useful in patients with stable renal function. The eGFR calculation will not be reliable in acutely ill patients when serum creatinine is changing rapidly. It is not useful in  patients on dialysis. The eGFR calculation may not be applicable to patients at the low and high extremes of body sizes, pregnant women, and vegetarians.    GFR, Estimated  Date Value Ref Range Status  06/15/2020 >60 >60 mL/min Final    Comment:    (NOTE) Calculated using the CKD-EPI Creatinine Equation (2021)    GFR calc non Af Amer  Date Value Ref Range Status  08/01/2020 67 >59 mL/min/1.73 Final   eGFR  Date Value Ref Range Status  05/29/2022 83 >59 mL/min/1.73 Final         Passed - Patient is not pregnant      Passed - Last BP in normal range    BP Readings from Last 1 Encounters:  05/29/22 (!) 113/58         Passed - Valid encounter within last  6 months    Recent Outpatient Visits           3 months ago Acute cystitis without hematuria   [facility] Merita Norton T, FNP   4 months ago Acute cough   [facility] [place], [place], PA-C   6 months ago Uncontrolled type 2 diabetes mellitus with hyperglycemia, with long-term current use of insulin [facility])   [place] [facility] Merita Norton T, FNP   6 months ago Hemoptysis   [place] [facility] [place], [place], PA-C   10 months ago Uncontrolled type 2 diabetes mellitus with hyperglycemia, with long-term current use of insulin [facility])   [place] [facility] [place] D[place] M, DO               pantoprazole (PROTONIX) 40 MG tablet [Pharmacy Med Name: Pantoprazole Sodium 40 MG Oral Tablet Delayed Release] 90 tablet 0     Sig: Take 1 tablet by mouth once daily     Gastroenterology: Proton Pump Inhibitors Passed - 10/07/2022 12:12 PM      Passed - Valid encounter within last 12 months    Recent Outpatient Visits           3 months ago Acute cystitis without hematuria   [facility] Merita Norton T, FNP   4 months ago Acute cough   [place] [facility] [place], [place], PA-C   6 months ago Uncontrolled type 2 diabetes mellitus with hyperglycemia, with long-term current use of insulin [facility])   [place] [facility] Mer[facility] Norton T, FNP   6 months ago Hemoptysis   [place] [facility] [place], [place], PA-C   10 months ago Uncontrolled type 2 diabetes mellitus with hyperglycemia, with long-term current use of insulin [facility])   [facility] [place], [place]

## 2022-10-19 DIAGNOSIS — E1165 Type 2 diabetes mellitus with hyperglycemia: Secondary | ICD-10-CM | POA: Diagnosis not present

## 2022-10-22 DIAGNOSIS — Z6841 Body Mass Index (BMI) 40.0 and over, adult: Secondary | ICD-10-CM | POA: Diagnosis not present

## 2022-10-22 DIAGNOSIS — E1169 Type 2 diabetes mellitus with other specified complication: Secondary | ICD-10-CM | POA: Diagnosis not present

## 2022-10-22 DIAGNOSIS — E785 Hyperlipidemia, unspecified: Secondary | ICD-10-CM | POA: Diagnosis not present

## 2022-10-22 DIAGNOSIS — E1165 Type 2 diabetes mellitus with hyperglycemia: Secondary | ICD-10-CM | POA: Diagnosis not present

## 2022-10-22 DIAGNOSIS — E1159 Type 2 diabetes mellitus with other circulatory complications: Secondary | ICD-10-CM | POA: Diagnosis not present

## 2022-10-22 DIAGNOSIS — I152 Hypertension secondary to endocrine disorders: Secondary | ICD-10-CM | POA: Diagnosis not present

## 2022-10-22 LAB — HEMOGLOBIN A1C: Hemoglobin A1C: 8.6

## 2022-10-24 ENCOUNTER — Ambulatory Visit: Payer: Medicare HMO

## 2022-10-24 DIAGNOSIS — E1169 Type 2 diabetes mellitus with other specified complication: Secondary | ICD-10-CM

## 2022-10-24 DIAGNOSIS — E1165 Type 2 diabetes mellitus with hyperglycemia: Secondary | ICD-10-CM

## 2022-10-24 DIAGNOSIS — E1159 Type 2 diabetes mellitus with other circulatory complications: Secondary | ICD-10-CM

## 2022-10-24 NOTE — Progress Notes (Signed)
Care Management & Coordination Services Pharmacy Note  10/24/2022 Name:  Alexis Lloyd MRN:  [id number] DOB:  [birth year]  Summary: Patient presents for follow-up consult.   -Patient reports she would instructed by a provider to decrease her lisinopril-HCTZ to one tablet daily due to hypotension symptoms. She is unsure, but believes this was during her acute visit with Debera Lat, PA-C on 05/26/22. I am not able to find this documented in the chart.   -Patient is tolerating Ozempic well, and she has an upcoming shipment that will plan to increase her Ozempic to 1 mg weekly.  -Patient is at high risk of cardiovascular disease (>20%) with elevated LDL. Discussed increasing rosuvastatin to a high-intensity option, but patient as not amenable to any changes at this time.   Recommendations/Changes made from today's visit: -Continue current medications -HC assessment in 2 weeks to assess home blood pressure readings.   Follow up plan: CPP follow-up 3 months   Patient-specific goals:  -Goal: Patient will maintain blood pressure less than 130/80 over the next 30 days through continued adherence to her prescribed regimen, following a low-sodium diet and monitoring her blood pressure weekly and recording.  -Goal: Patient will achieve A1c less than 8% within 90 days by adherence to her prescribed medication regimen, continued monitoring of her eating habits, and increasing her activity gradually until she reaches at least 150 minutes of moderate-intensity activity weekly.   -Goal: Patient will achieve LDL less than 70 within 90 days by continued adherence to prescribed regimen and reduction in saturated fat intake.   Subjective: Alexis Lloyd is an 62 y.o. year old female who is a primary patient of Jacky Kindle, FNP.  The care coordination team was consulted for assistance with disease management and care coordination needs.    Engaged with patient by telephone for follow up  visit.  Patient Care Team: Jacky Kindle, FNP as PCP - General (Family Medicine) Mirna Mires, CNM as Midwife (Obstetrics) Gaspar Cola, [facility] (Pharmacist) Sherlon Handing, MD as Consulting Physician (Endocrinology) Kemper Durie, RN as [facility] Care Management  Recent office visits: 05/26/22: Patient presented to Debera Lat, PA-C for cough,  04/10/22: Patient presented to Merita Norton, FNP for follow-up. A1c 9.5%.  04/03/22: Patient presented to Debera Lat, PA-C for hemoptysis. Azithromycin.    Recent consult visits: 10/22/22: Patient presented to Dr. Gershon Crane (Endocrinology)  02/06/22: Patient presented to Verdis Frederickson (Endocrinology)   Hospital visits: None in previous 6 months   Objective:  Lab Results  Component Value Date   CREATININE 0.80 05/29/2022   BUN 17 05/29/2022   EGFR 83 05/29/2022   GFRNONAA 67 08/01/2020   GFRAA 77 08/01/2020   NA 143 05/29/2022   K 4.5 05/29/2022   CALCIUM 10.1 05/29/2022   CO2 25 05/29/2022   GLUCOSE 187 (H) 05/29/2022    Lab Results  Component Value Date/Time   HGBA1C 9.5 (A) 04/10/2022 03:29 PM   HGBA1C 11.6 (A) 11/29/2021 10:48 AM   HGBA1C 11.7 (H) 08/30/2021 10:49 AM   HGBA1C 9.1 (H) 05/17/2021 09:38 AM   MICROALBUR 50 08/31/2017 10:06 AM   MICROALBUR 50 03/05/2016 02:04 PM    Last diabetic Eye exam:  Lab Results  Component Value Date/Time   HMDIABEYEEXA No Retinopathy 03/05/2022 12:00 AM    Last diabetic Foot exam: No results found for: "HMDIABFOOTEX"   Lab Results  Component Value Date   CHOL 152 05/29/2022   HDL 28 (L) 05/29/2022   LDLCALC  98 05/29/2022   TRIG 142 05/29/2022   CHOLHDL 5.4 (H) 05/29/2022       Latest Ref Rng & Units 05/29/2022   11:11 AM 05/17/2021    9:38 AM 08/01/2020   11:27 AM  Hepatic Function  Total Protein 6.0 - 8.5 g/dL 7.2  7.0  7.1   Albumin 3.9 - 4.9 g/dL 4.2  4.3  4.3   AST 0 - 40 IU/L 5  6  8    ALT 0 - 32 IU/L 13  14  13    Alk Phosphatase  44 - 121 IU/L 102  103  64   Total Bilirubin 0.0 - 1.2 mg/dL 0.3  0.2  0.3     Lab Results  Component Value Date/Time   TSH 1.990 05/29/2022 11:11 AM   TSH 2.190 09/16/2018 11:12 AM       Latest Ref Rng & Units 05/29/2022   11:11 AM 05/17/2021    9:38 AM 08/01/2020   11:27 AM  CBC  WBC 3.4 - 10.8 x10E3/uL 8.2  7.2  6.5   Hemoglobin 11.1 - 15.9 g/dL 16.1  09.6  04.5   Hematocrit 34.0 - 46.6 % 36.0  34.6  33.2   Platelets 150 - 450 x10E3/uL 302  315  243     Lab Results  Component Value Date/Time   VITAMINB12 461 08/31/2017 10:50 AM   VITAMINB12 468 01/15/2017 03:50 PM    Clinical ASCVD: No  The 10-year ASCVD risk score (Arnett DK, et al., 2019) is: 20.7%   Values used to calculate the score:     Age: 2 years     Sex: Female     Is Non-Hispanic African American: Yes     Diabetic: Yes     Tobacco smoker: No     Systolic Blood Pressure: 134 mmHg     Is BP treated: Yes     HDL Cholesterol: 28 mg/dL     Total Cholesterol: 152 mg/dL       09/15/8117    1:47 PM 05/29/2022   10:57 AM 04/10/2022    3:17 PM  Depression screen PHQ 2/9  Decreased Interest 0 0 0  Down, Depressed, Hopeless 0 0 0  PHQ - 2 Score 0 0 0  Altered sleeping 0 2 0  Tired, decreased energy 0 2 0  Change in appetite 0 0 0  Feeling bad or failure about yourself  0 0 0  Trouble concentrating 0 0 0  Moving slowly or fidgety/restless 0 0 0  Suicidal thoughts 0 0 0  PHQ-9 Score 0 4 0  Difficult doing work/chores Not difficult at all Not difficult at all Not difficult at all    Social History   Tobacco Use  Smoking Status Former   Packs/day: 0.25   Years: 21.00   Additional pack years: 0.00   Total pack years: 5.25   Types: Cigarettes   Quit date: 06/08/1997   Years since quitting: 25.3  Smokeless Tobacco Never   BP Readings from Last 3 Encounters:  05/29/22 (!) 113/58  05/26/22 124/64  04/10/22 127/71   Pulse Readings from Last 3 Encounters:  05/29/22 89  05/26/22 90  04/10/22 92    Wt Readings from Last 3 Encounters:  05/29/22 216 lb (98 kg)  05/26/22 216 lb 12.8 oz (98.3 kg)  04/10/22 213 lb (96.6 kg)   BMI Readings from Last 3 Encounters:  06/20/22 43.63 kg/m  05/29/22 42.90 kg/m  05/26/22 43.06 kg/m    Allergies  Allergen Reactions   Allegra [Fexofenadine] Nausea And Vomiting   Avelox [Moxifloxacin] Other (See Comments)    Hallucinations   Bactrim [Sulfamethoxazole-Trimethoprim] Other (See Comments)    unknown   Etodolac Nausea Only   Fish-Derived Products Nausea And Vomiting    With "seafood"   Penicillins Diarrhea and Nausea And Vomiting   Shellfish Allergy Nausea Only   Sulfa Antibiotics Other (See Comments)    unknown   Macrobid [Nitrofurantoin Monohyd Macro] Other (See Comments)    Constipation and globus hystericus.    Medications Reviewed Today     Reviewed by Jacky Kindle, FNP (Family Nurse Practitioner) on 06/20/22 at 1411  Med List Status: <None>   Medication Order Taking? Sig Documenting Provider Last Dose Status Informant  albuterol (VENTOLIN HFA) 108 (90 Base) MCG/ACT inhaler [id number] Yes Inhale 2 puffs into the lungs every 6 (six) hours as needed. Debera Lat, PA-C Taking Active   aspirin EC 81 MG tablet [id number] Yes Take 1 tablet (81 mg total) by mouth daily. Swallow whole. Margaretann Loveless, PA-C Taking Active   Blood Glucose Monitoring Suppl [facility] VERIO) w/Device Andria Rhein [id number] Yes To check blood sugar once daily Margaretann Loveless, [place] Taking Active   ciprofloxacin (CIPRO) 250 MG tablet [id number] Yes Take 1 tablet (250 mg total) by mouth 2 (two) times daily for 5 days. Jacky Kindle, FNP  Active   conjugated estrogens (PREMARIN) vaginal cream [id number] Yes Place 1 Applicatorful vaginally daily. Jacky Kindle, FNP Taking Active   Continuous Blood Gluc Receiver (FREESTYLE LIBRE [address] READER) DEVI [id number] Yes Use per package directions. Caro Laroche, DO Taking Active   dapagliflozin propanediol (FARXIGA)  10 MG TABS tablet [id number] Yes Take 1 tablet (10 mg total) by mouth daily before breakfast. Patient receives via AZ&ME patient assistance Jacky Kindle, FNP Taking Active   glipiZIDE (GLUCOTROL) 10 MG tablet [id number] Yes TAKE 1 TABLET BY MOUTH TWICE  DAILY BEFORE MEALS  Patient taking differently: Take 10 mg by mouth daily.   Jacky Kindle, FNP Taking Active   glucose blood [facility] VERIO) test strip [id number] Yes To check blood sugar once daily Caro Laroche, DO Taking Active   ibuprofen (ADVIL) 200 MG tablet [id number] Yes Take 400 mg by mouth every 6 (six) hours as needed. [provider] Taking Active   insulin degludec (TRESIBA FLEXTOUCH) 100 UNIT/ML FlexTouch Pen [id number] Yes Inject 24 Units into the skin at bedtime. [provider] Taking Active   Insulin Pen Needle (PEN NEEDLES) 32G X 6 MM MISC [id number] Yes 1 each by Does not apply route at bedtime. Jacky Kindle, FNP Taking Active   Lancets [facility] ULTRASOFT) lancets [id number] Yes Use as instructed Caro Laroche, DO Taking Active   lisinopril-hydrochlorothiazide (ZESTORETIC) 20-12.5 MG tablet [id number] Yes Take 2 tablets by mouth once daily  Patient taking differently: daily.   Jacky Kindle, FNP Taking Active   loperamide (IMODIUM) 2 MG capsule [id number] Yes Take 2 capsules (4 mg total) by mouth 4 (four) times daily as needed for diarrhea or loose stools. Jacky Kindle, FNP Taking Active   montelukast (SINGULAIR) 10 MG tablet [id number] Yes Take 1 tablet by mouth once daily Jacky Kindle, FNP Taking Active   Multiple Vitamins-Minerals (PRESERVISION AREDS 2) CAPS [id number] Yes Take 1 capsule by mouth daily. [provider] Taking Active   pantoprazole (PROTONIX) 40 MG tablet [id number] Yes Take 1 tablet (40 mg total) by mouth daily. Jacky Kindle, FNP Taking  Active   rosuvastatin (CRESTOR) 10 MG tablet [id number] Yes Take 1 tablet by mouth once daily Jacky Kindle, FNP Taking Active    scopolamine (TRANSDERM-SCOP) 1 MG/3DAYS [id number] No Place 1 patch (1.5 mg total) onto the skin every 3 (three) days.  Patient not taking: Reported on 06/20/2022   Jacky Kindle, FNP Not Taking Active   spironolactone (ALDACTONE) 25 MG tablet [id number] Yes Take 1 tablet (25 mg total) by mouth daily. Jacky Kindle, FNP Taking Active   SUMAtriptan (IMITREX) 50 MG tablet [id number] Yes Take 1 tablet (50 mg total) by mouth every 2 (two) hours as needed for migraine. No more than 200mg  in 24 hours Margaretann Loveless, [place] Taking Active Other            Patient Active Problem List   Diagnosis Date Noted   Acute cystitis without hematuria 06/20/2022   OAB (overactive bladder) 05/29/2022   Encounter for subsequent annual wellness visit (AWV) in Medicare patient 05/29/2022   Acute non-recurrent maxillary sinusitis 05/29/2022   Itching in the vaginal area 11/29/2021   Motion sickness 11/29/2021   Acquired deformity of left toe 11/29/2021   Vaginal dryness, menopausal 10/10/2021   Vaginal discharge 10/10/2021   Acute cystitis with hematuria 10/10/2021   Urinary frequency 08/30/2021   Urge incontinence of urine 08/30/2021   Vaginal yeast infection 08/30/2021   Annual physical exam 05/17/2021   Screening for cervical cancer 05/17/2021   Encounter for screening mammogram for malignant neoplasm of breast 05/17/2021   Elevated LDL cholesterol level 05/17/2021   Iron deficiency anemia secondary to inadequate dietary iron intake 05/17/2021   Hyperlipidemia associated with type 2 diabetes mellitus (HCC) 05/17/2021   Uncontrolled type 2 diabetes mellitus with hyperglycemia, with long-term current use of insulin (HCC) 05/17/2021   Need for shingles vaccine 05/17/2021   Routine screening for STI (sexually transmitted infection) 05/17/2021   Itchy skin 05/17/2021   Dry skin dermatitis 05/17/2021   Pain due to onychomycosis of toenails of both feet 10/24/2019   History of Helicobacter pylori  infection 11/05/2017   Heartburn    Diverticulosis of large intestine without diverticulitis    Warts of foot 06/30/2017   Hallux rigidus of left foot 01/28/2017   Hallux rigidus, right foot 01/28/2017   Pes planus 01/28/2017   Diabetic neuropathy (HCC) 11/18/2014   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   Hypercholesteremia 11/18/2014   Hypertension associated with diabetes (HCC) 11/18/2014   Personal history of breast cancer 10/04/2014    Immunization History  Administered Date(s) Administered   PFIZER Comirnaty(Gray Top)Covid-19 Tri-Sucrose Vaccine 08/15/2020   PFIZER(Purple Top)SARS-COV-2 Vaccination 07/31/2019, 09/13/2019   Pfizer Covid-19 Vaccine Bivalent Booster 76yrs & up 03/28/2021   Pneumococcal Polysaccharide-23 06/22/2009, 08/06/2015   Tdap 08/31/2017   Zoster Recombinat (Shingrix) 08/15/2021     Compliance/Adherence/Medication fill history: Care Gaps: Shingrix Vaccine COVID-19 Vaccine Influenza Vaccine;O A1C   Star-Rating Drugs: Lisinopril-HCTZ 20-12.5 mg last filled on 01/01/2022 for 90 day supply at [facility]. Glipizide 10 mg last filled on 01/29/2022 for 100 day supply at [facility]. Rosuvastatin 10 mg last filled on 02/19/2022 for 90 day supply at [facility].  SDOH:  (Social Determinants of Health) assessments and interventions performed: Yes SDOH Interventions    Flowsheet Row Office Visit from 04/03/2022 in [facility] Care Coordination from 03/03/2022 in [facility] Care Coordination Chronic Care Management from 04/22/2021 in [facility] Chronic Care Management from 01/25/2021 in [facility]  Chronic Care Management from 10/30/2020 in [facility] Chronic Care Management from 07/23/2020 in [facility]  SDOH Interventions        Food Insecurity Interventions -- Intervention Not Indicated -- -- --  --  Housing Interventions -- Intervention Not Indicated -- -- -- --  Transportation Interventions -- Intervention Not Indicated -- -- -- Intervention Not Indicated  Utilities Interventions -- Intervention Not Indicated -- -- -- --  Alcohol Usage Interventions -- Intervention Not Indicated (Score <7) -- -- -- --  Depression Interventions/Treatment  PHQ2-9 Score <4 Follow-up Not Indicated -- -- -- -- --  Financial Strain Interventions -- Other (Comment)  [PAP assistant] Other (Comment)  [PAP] Intervention Not Indicated Other (Comment)  [PAP] Other (Comment)  [PAP]  Stress Interventions -- Intervention Not Indicated -- -- -- --  Social Connections Interventions -- Intervention Not Indicated -- -- -- --      SDOH Screenings   Food Insecurity: No Food Insecurity (03/03/2022)  Housing: Low Risk  (03/03/2022)  Transportation Needs: No Transportation Needs (03/03/2022)  Utilities: Not At Risk (03/03/2022)  Alcohol Screen: Low Risk  (04/10/2022)  Depression (PHQ2-9): Low Risk  (06/20/2022)  Financial Resource Strain: Low Risk  (03/03/2022)  Physical Activity: Insufficiently Active (04/13/2017)  Social Connections: Socially Integrated (03/03/2022)  Stress: No Stress Concern Present (03/03/2022)  Tobacco Use: Medium Risk (06/20/2022)    Medication Assistance:  Kathalene Frames obtained through [employer] medication assistance program.  Enrollment ends TBD  Medication Access: Within the past 30 days, how often has patient missed a dose of medication? None Is a pillbox or other method used to improve adherence? Yes  Factors that may affect medication adherence? financial need Are meds synced by current pharmacy? No  Are meds delivered by current pharmacy? Yes  Does patient experience delays in picking up medications due to transportation concerns? No   [facility] Services Reviewed: Is patient disadvantaged to use [facility]?: Yes  Current Rx insurance plan: [facility] Name and location of Current  pharmacy:  [facility] [address] ([address]), [place] - [address] [place]) [place] [address] Phone: [phone] Fax: [phone]  [facility]) - [place], [place] - [address] [facility] [address] [place] [address] [place] [address] Phone: [phone] Fax: [phone]  [facility] Delivery - [place], [place] - [address] [place] [address] Phone: [phone] Fax: [phone]  [facility] - [place], [place] - [address] [place] [address] Phone: [phone] Fax: [phone]  [facility] services reviewed with patient today?: No  Patient requests to transfer care to [facility]?: No  Reason patient declined to change pharmacies: Disadvantaged due to insurance/mail order  Assessment/Plan  Hypertension (BP goal <130/80) -Not ideally controlled -Current treatment: Lisinopril-HCTZ 20-12.5 mg daily: Appropriate, Effective, Safe, Accessible Spironolactone 25 mg daily: Appropriate, Effective, Safe, Accessible  -Medications previously tried: Amlodipine, diltiazem (hypotension)   -Patient reports she would instructed by a provider to decrease her lisinopril-HCTZ to one tablet daily due to hypotension symptoms. She is unsure, but believes this was during her acute visit with Debera Lat, PA-C on 05/26/22. I am not able to find this documented in the chart.  -Unfortunately patient appears to have had #180 tablets of lisinopril-HCTZ refilled on 10/09/22, so she may fail the [facility] gap measure for this year. Will send updated Rx once  we better understand her BP readings.  -Will plan for patient to monitor home blood pressure weekly and better assess hypertension control on 1 pill of lisinopril-HCTZ.  -Goal: Patient will maintain blood pressure less than 130/80 over the next 30 days through continued adherence to her prescribed regimen,  following a low-sodium diet and monitoring her blood pressure weekly and recording. -Continue current medications -HC assessment in 2 weeks to assess home blood pressure readings.   Hyperlipidemia: (LDL goal < 70) -Uncontrolled -Current treatment: Rosuvastatin 10 mg daily: Appropriate, Query effective -Medications previously tried: Pravastatin, Lovastatin -Patient is at high risk of cardiovascular disease (>20%) with elevated LDL. Discussed increasing rosuvastatin to a high-intensity option, but patient as not amenable to any changes at this time.  -Goal: Patient will achieve LDL less than 70 within 90 days by continued adherence to prescribed regimen and reduction in saturated fat intake.   Diabetes (A1c goal <8%) -Managed by Verdis Frederickson -Less stringent goal given long-standing history of elevated readings and patient preference to minimize medication changes -Uncontrolled -Current medications: Farxiga 10 mg daily: Appropriate, Query effective Glipizide 10 mg twice daily: Appropriate, Query effective Ozempic 0.5 mg weekly: Appropriate, Query effective  Tresiba 24 daily: Appropriate, Query effective  -Medications previously tried: Januvia, Metformin (diarrhea), Pioglitazone (Edema), Trulicity, Rybelsus (ineffective) -Current home glucose readings (uses freestyle libre 3). CGM report at Dr. Gershon Crane showed 14-day average of 185 and TIR of 46%.  -Denies hypoglycemic symptoms. -Current meal patterns: Trying to drink more water, making substitutions in her diet.  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  -Patient is tolerating Ozempic well, and she has an upcoming shipment that will plan to increase her Ozempic to 1 mg weekly. -Goal: Patient will achieve A1c less than 8% within 90 days by adherence to her prescribed medication regimen, continued monitoring of her eating habits, and increasing her activity  gradually until she reaches at least 150 minutes of moderate-intensity activity weekly.    Asthma (Goal: control symptoms and prevent exacerbations) -controlled -Current treatment  Ventolin HFA 108 mcg/act 2 puff every 6 hours as needed  Montelukast 10 mg daily  -Medications previously tried: NA  -Pulmonary function testing: NA -Exacerbations requiring treatment in last 6 months: Yes, due to recent covid infection -Patient denies consistent use of maintenance inhaler -Frequency of rescue inhaler use: twice weekly -Counseled on Proper inhaler technique; When to use rescue inhaler -Recommended to continue current medication  GERD (Goal: prevent symptoms of heartburn and reflux) -controlled -Current treatment  Pantoprazole 40 mg daily  -Medications previously tried: NA  -Recommended to continue current medication  Osteoarthritis (Goal: minimize arthritis pain) -Controlled -Current treatment  Acetaminophen CR 650 mg every 8 hours as needed Ibuprofen 200 mg 2 tablets every 6 hours as needed  -Counseled to avoid greater than 3000 mg of acetaminophen daily  -Recommended to continue current medication  Angelena Sole, PharmD, Patsy Baltimore, CPP  Clinical Pharmacist Practitioner  [facility] [phone]

## 2022-10-27 ENCOUNTER — Ambulatory Visit: Payer: Self-pay | Admitting: *Deleted

## 2022-10-27 NOTE — Patient Outreach (Signed)
  Care Coordination   Follow Up Visit Note   10/27/2022 Name: Alexis Lloyd MRN: [id number] DOB: 1960/11/19  Alexis Lloyd is a 62 y.o. year old female who sees Jacky Kindle, FNP for primary care. I spoke with  Alexis Lloyd by phone today.  What matters to the patients health and wellness today?  Manage DM and keep decreasing A1C.    Goals Addressed             This Visit's Progress    RNCM: Effective Management of DM       Care Coordination Interventions:   Provided education to patient about basic DM disease process Reviewed medications with patient and discussed importance of medication adherence Counseled on importance of regular laboratory monitoring as prescribed.   Discussed plans with patient for ongoing care management follow up and provided patient with direct contact information for care management team Provided patient with written educational materials related to hypo and hyperglycemia and importance of correct treatment.   Advised patient, providing education and rationale, to check cbg BID  and record, calling pcp or endocrinologist  for findings outside established parameters. Education on the goal of fasting of <130 and post prandial of <180. Education and support given Review of patient status, including review of consultants reports, relevant laboratory and other test results, and medications completed.  The patient agrees to the care coordination program and working with the [facility] for help in managing DM and other chronic conditions. The patient knows how to reach the [facility] and knows the [facility] will reach out on a regular basis.  Reviewed decrease of A1C, discussed plan to continue decreasing (diet change, exercise, increasing water intake). Discussed recent change to Ozempic, continue to be without  GI symptoms           SDOH assessments and interventions completed:  No     Care Coordination Interventions:  Yes, provided   Interventions Today     Flowsheet Row Most Recent Value  Chronic Disease   Chronic disease during today's visit Diabetes  General Interventions   General Interventions Discussed/Reviewed General Interventions Reviewed, Labs  Labs Hgb A1c every 3 months  [Recently decreased to 8.6, resulted on 5/14]  Exercise Interventions   Exercise Discussed/Reviewed Weight Managment  Weight Management Weight loss  [On Ozempic, state she does not know current weight, but has decreased in clothes sizes]  Education Interventions   Education Provided Provided Education  Provided Verbal Education On Blood Sugar Monitoring, Medication, When to see the doctor, Nutrition  [Does not have current blood sugar reading, state she switched her sensor, waiting for it to calibrate and give a reading.  Denies any side effects from Ozempic]  Nutrition Interventions   Nutrition Discussed/Reviewed Nutrition Reviewed, Decreasing sugar intake  [state she "fell off the wagon" with her recent birthday and mother's day but has not restarted her diabetic diet]        Follow up plan: Follow up call scheduled for 7/11    Encounter Outcome:  Pt. Visit Completed   Kemper Durie, RN, MSN, [facility] Care Management Care Management Coordinator [phone]

## 2022-10-27 NOTE — Patient Outreach (Signed)
  Care Coordination   10/27/2022 Name: Alexis Lloyd MRN: [id number] DOB: April 13, 1961   Care Coordination Outreach Attempts:  An unsuccessful telephone outreach was attempted for a scheduled appointment today.  Follow Up Plan:  Additional outreach attempts will be made to offer the patient care coordination information and services.   Encounter Outcome:  No Answer   Care Coordination Interventions:  No, not indicated    Kemper Durie, RN, MSN, [facility] Care Management Care Management Coordinator [phone]

## 2022-10-31 ENCOUNTER — Other Ambulatory Visit: Payer: Self-pay | Admitting: Family Medicine

## 2022-10-31 DIAGNOSIS — K219 Gastro-esophageal reflux disease without esophagitis: Secondary | ICD-10-CM

## 2022-10-31 MED ORDER — PANTOPRAZOLE SODIUM 40 MG PO TBEC: 40.0000 mg | DELAYED_RELEASE_TABLET | Freq: Every day | ORAL | 1 refills | Status: DC

## 2022-10-31 NOTE — Telephone Encounter (Signed)
Medication Refill - Medication: pantoprazole (PROTONIX) 40 MG tablet and   Has the patient contacted their pharmacy? Yes.   Pt told to contact provider  Preferred Pharmacy (with phone number or street name):  [facility] [address] [place]), [place] - [address] Phone: [phone]  Fax: [phone]     Has the patient been seen for an appointment in the last year OR does the patient have an upcoming appointment? Yes.    Agent: Please be advised that RX refills may take up to 3 business days. We ask that you follow-up with your pharmacy.

## 2022-10-31 NOTE — Telephone Encounter (Signed)
Requested Prescriptions  Pending Prescriptions Disp Refills   pantoprazole (PROTONIX) 40 MG tablet 90 tablet 1    Sig: Take 1 tablet (40 mg total) by mouth daily.     Gastroenterology: Proton Pump Inhibitors Passed - 10/31/2022 11:26 AM      Passed - Valid encounter within last 12 months    Recent Outpatient Visits           4 months ago Acute cystitis without hematuria   [facility] Merita Norton T, FNP   5 months ago Acute cough   [place] [facility] [place], [place], PA-C   6 months ago Uncontrolled type 2 diabetes mellitus with hyperglycemia, with long-term current use of insulin [facility])   [place] [facility] Merita Norton T, FNP   7 months ago Hemoptysis   [place] [facility] [place], [place], PA-C   10 months ago Uncontrolled type 2 diabetes mellitus with hyperglycemia, with long-term current use of insulin [facility])   [facility] Caro [place], [place]

## 2022-11-04 ENCOUNTER — Telehealth: Payer: Self-pay

## 2022-11-04 NOTE — Telephone Encounter (Signed)
Copied from CRM [id number]. Topic: General - Other >> Nov 04, 2022 11:26 AM Alexis Lloyd wrote: Patient is requesting paperwork be completed so she can get permanent handicap license plates on her vehicles. Please advise.

## 2022-11-05 NOTE — Telephone Encounter (Signed)
Patient was advised that we need to have paperwork from the [facility].  She states she will get it and bring to office

## 2022-11-05 NOTE — Telephone Encounter (Signed)
Pt dropped of two applications.  She had one filled out in 05/2022 but says that she has three cars and needs a plate for each car.  Placed in Elise's box.

## 2022-11-06 NOTE — Telephone Encounter (Signed)
Notified pt with the message below.

## 2022-11-07 ENCOUNTER — Telehealth: Payer: Self-pay

## 2022-11-07 NOTE — Progress Notes (Signed)
Care Management & Coordination Services Pharmacy Team  Reason for Encounter: Hypertension  Contacted patient to discuss hypertension disease state.  Spoke with patient on 11/07/2022     Current antihypertensive regimen:  Lisinopril-HCTZ 20-12.5 mg daily  Spironolactone 25 mg daily    Patient verbally confirms she is taking the above medications as directed. Yes  How often are you checking your Blood Pressure?  Patient states she has not been checking her blood pressure recently because she has "a lot going on", but plans on starting soon.  Patient denies any headaches, lightheadedness or dizziness.  Patient is aware I will reach out to her within 3 weeks to check to see if she started to check her blood pressure at home. I also inform her per Clinical pharmacist note from 10/24/2022 "Will plan for patient to monitor home blood pressure weekly and better assess hypertension control on 1 pill of lisinopril-HCTZ". Once we have a better understanding of her home blood pressure the clinical pharmacist notes he "will send updated Rx" . Patient verbalized understanding, and states she is going to start hopefully this weekend.  Current home BP readings: None ID  Wrist or arm cuff: Patient reports she use a arm cuff when she checks her blood pressure.  Caffeine intake: Patient states she trys to limit the amount of caffeine she has.  Salt intake: Patient reports she does not add salt to her meals at the table, but may add some while cooking.  OTC medications including pseudoephedrine or NSAIDs? Patient reports she takes Aspirin 81 mg daily.  Any readings above 180/100? None ID   What recent interventions/DTPs have been made by any provider to improve Blood Pressure control since last CPP Visit:  10/24/2022 Per Clinical pharmacist note: Goal: Patient will maintain blood pressure less than 130/80 over the next 30 days through continued adherence to her prescribed regimen, following a  low-sodium diet and monitoring her blood pressure weekly and recording.   Any recent hospitalizations or ED visits since last visit with CPP? No  What diet changes have been made to improve Blood Pressure Control?  Patient reports her appetite has decrease since she started taking Ozempic.  What exercise is being done to improve your Blood Pressure Control?  Patient states she join [employer], and she walks daily.  Adherence Review: Is the patient currently on ACE/ARB medication? Yes Does the patient have >5 day gap between last estimated fill dates? No  Care Gaps: Shingrix Vaccine COVID-19 Vaccine A1C -9.5% on 04/10/2022  Annual wellness visit in last year? Yes, last completed 05/29/2022. Last eye exam / retinopathy screening:last completed 03/05/2022. Last diabetic foot exam:last completed 11/29/2021   Star Rating Drugs: Lisinopril-HCTZ 20-12.5 mg last filled on 10/09/2022 for 90 day supply at [facility]. Glipizide 10 mg last filled on 04/29/2022 for 100 day supply at [facility]. Rosuvastatin 10 mg last filled on 09/01/2022 for 90 day supply at [facility]. Farxiga 10 mg last filled 10/01/2021 for 30 day supply at [facility] (receives from [employer]).   Chart Updates: Recent office visits:  None ID  Recent consult visits:  None ID  Hospital visits:  None in previous 6 months  Medications: Outpatient Encounter Medications as of 11/07/2022  Medication Sig   albuterol (VENTOLIN HFA) 108 (90 Base) MCG/ACT inhaler Inhale 2 puffs into the lungs every 6 (six) hours as needed.   aspirin EC 81 MG tablet Take 1 tablet (81 mg total) by mouth daily. Swallow whole.   Blood Glucose Monitoring Suppl (  ONETOUCH VERIO) w/Device KIT To check blood sugar once daily   conjugated estrogens (PREMARIN) vaginal cream Place 1 Applicatorful vaginally daily.   Continuous Blood Gluc Receiver (FREESTYLE LIBRE 14 DAY READER) DEVI Use per package directions.   dapagliflozin propanediol  (FARXIGA) 10 MG TABS tablet Take 1 tablet (10 mg total) by mouth daily before breakfast. Patient receives via [employer] patient assistance   glipiZIDE (GLUCOTROL) 10 MG tablet TAKE 1 TABLET BY MOUTH TWICE  DAILY BEFORE MEALS (Patient taking differently: Take 10 mg by mouth daily.)   glucose blood (ONETOUCH VERIO) test strip To check blood sugar once daily   ibuprofen (ADVIL) 200 MG tablet Take 400 mg by mouth every 6 (six) hours as needed.   insulin degludec (TRESIBA FLEXTOUCH) 100 UNIT/ML FlexTouch Pen Inject 24 Units into the skin at bedtime.   Insulin Pen Needle (PEN NEEDLES) 32G X 6 MM MISC 1 each by Does not apply route at bedtime.   Lancets (ONETOUCH ULTRASOFT) lancets Use as instructed   lisinopril-hydrochlorothiazide (ZESTORETIC) 20-12.5 MG tablet Take 2 tablets by mouth once daily   loperamide (IMODIUM) 2 MG capsule Take 2 capsules (4 mg total) by mouth 4 (four) times daily as needed for diarrhea or loose stools.   montelukast (SINGULAIR) 10 MG tablet Take 1 tablet by mouth once daily   Multiple Vitamins-Minerals (PRESERVISION AREDS 2) CAPS Take 1 capsule by mouth daily.   pantoprazole (PROTONIX) 40 MG tablet Take 1 tablet (40 mg total) by mouth daily.   rosuvastatin (CRESTOR) 10 MG tablet Take 1 tablet by mouth once daily   scopolamine (TRANSDERM-SCOP) 1 MG/3DAYS Place 1 patch (1.5 mg total) onto the skin every 3 (three) days. (Patient not taking: Reported on 06/20/2022)   Semaglutide,0.25 or 0.5MG /DOS, (OZEMPIC, 0.25 OR 0.5 MG/DOSE,) 2 MG/1.5ML SOPN Inject 0.25 mg into the skin weekly for four weeks, the increase to 0.5 mg weekly.   spironolactone (ALDACTONE) 25 MG tablet Take 1 tablet (25 mg total) by mouth daily.   SUMAtriptan (IMITREX) 50 MG tablet Take 1 tablet (50 mg total) by mouth every 2 (two) hours as needed for migraine. No more than 200mg  in 24 hours   No facility-administered encounter medications on file as of 11/07/2022.    Recent Office Vitals: BP Readings from Last 3  Encounters:  05/29/22 (!) 113/58  05/26/22 124/64  04/10/22 127/71   Pulse Readings from Last 3 Encounters:  05/29/22 89  05/26/22 90  04/10/22 92    Wt Readings from Last 3 Encounters:  05/29/22 216 lb (98 kg)  05/26/22 216 lb 12.8 oz (98.3 kg)  04/10/22 213 lb (96.6 kg)     Kidney Function Lab Results  Component Value Date/Time   CREATININE 0.80 05/29/2022 11:11 AM   CREATININE 1.01 (H) 12/11/2021 01:50 PM   CREATININE 0.75 04/14/2014 09:25 AM   CREATININE 0.71 11/02/2013 10:07 AM   GFRNONAA 67 08/01/2020 11:27 AM   GFRNONAA >60 06/15/2020 02:48 AM   GFRNONAA >60 04/14/2014 09:25 AM   GFRNONAA >60 11/02/2013 10:07 AM   GFRAA 77 08/01/2020 11:27 AM   GFRAA >60 04/14/2014 09:25 AM   GFRAA >60 11/02/2013 10:07 AM       Latest Ref Rng & Units 05/29/2022   11:11 AM 12/11/2021    1:50 PM 05/17/2021    9:38 AM  BMP  Glucose 70 - 99 mg/dL 161  096  045   BUN 8 - 27 mg/dL 17  23  16    Creatinine 0.57 - 1.00 mg/dL 4.09  1.01  0.73   BUN/Creat Ratio 12 - 28 21  23  22    Sodium 134 - 144 mmol/L 143  137  142   Potassium 3.5 - 5.2 mmol/L 4.5  4.2  4.3   Chloride 96 - 106 mmol/L 103  98  101   CO2 20 - 29 mmol/L 25  22  25    Calcium 8.7 - 10.3 mg/dL 16.1  09.6  04.5      [facility] Clinical Pharmacist Assistant [phone]

## 2022-11-19 DIAGNOSIS — E1165 Type 2 diabetes mellitus with hyperglycemia: Secondary | ICD-10-CM | POA: Diagnosis not present

## 2022-11-25 ENCOUNTER — Telehealth: Payer: Self-pay

## 2022-11-25 NOTE — Telephone Encounter (Signed)
Copied from CRM [id number]. Topic: General - Other >> Nov 25, 2022 10:33 AM Carrielelia G wrote: Reason for CRM:  Please call patient regarding the handicap placard/license plates.   Thank you

## 2022-11-26 NOTE — Telephone Encounter (Signed)
Spoke with patient and she would like 2 more disability license plates paperwork filled out for her other vehicles. Advised patient of paperwork fee. She verbalized understanding. Advised if any further information is needed we would contact her back. Outside of that she would be contacted when paperwork is completed. Patient verbalized understanding

## 2022-11-26 NOTE — Telephone Encounter (Signed)
LVMTCB. CRM created. Ok for PEC to advise 

## 2022-11-29 ENCOUNTER — Other Ambulatory Visit: Payer: Self-pay | Admitting: Family Medicine

## 2022-12-01 NOTE — Telephone Encounter (Signed)
Requested Prescriptions  Pending Prescriptions Disp Refills   rosuvastatin (CRESTOR) 10 MG tablet [Pharmacy Med Name: Rosuvastatin Calcium 10 MG Oral Tablet] 90 tablet 0    Sig: Take 1 tablet by mouth once daily     Cardiovascular:  Antilipid - Statins 2 Failed - 11/29/2022 12:35 PM      Failed - Lipid Panel in normal range within the last 12 months    Cholesterol, Total  Date Value Ref Range Status  05/29/2022 152 100 - 199 mg/dL Final   LDL Chol Calc (NIH)  Date Value Ref Range Status  05/29/2022 98 0 - 99 mg/dL Final   HDL  Date Value Ref Range Status  05/29/2022 28 (L) >39 mg/dL Final   Triglycerides  Date Value Ref Range Status  05/29/2022 142 0 - 149 mg/dL Final         Passed - Cr in normal range and within 360 days    Creatinine  Date Value Ref Range Status  04/14/2014 0.75 0.60 - 1.30 mg/dL Final   Creatinine, Ser  Date Value Ref Range Status  05/29/2022 0.80 0.57 - 1.00 mg/dL Final         Passed - Patient is not pregnant      Passed - Valid encounter within last 12 months    Recent Outpatient Visits           5 months ago Acute cystitis without hematuria   [facility] Merita Norton T, FNP   6 months ago Acute cough   [place] [facility] [place], [place], PA-C   7 months ago Uncontrolled type 2 diabetes mellitus with hyperglycemia, with long-term current use of insulin [facility])   [place] [facility] Merita Norton T, FNP   8 months ago Hemoptysis   [place] [facility] [place], [place], PA-C   11 months ago Uncontrolled type 2 diabetes mellitus with hyperglycemia, with long-term current use of insulin [facility])   [facility] Caro [place], [place]

## 2022-12-03 ENCOUNTER — Telehealth: Payer: Self-pay

## 2022-12-03 DIAGNOSIS — Z1231 Encounter for screening mammogram for malignant neoplasm of breast: Secondary | ICD-10-CM

## 2022-12-03 NOTE — Telephone Encounter (Signed)
Copied from CRM [id number]. Topic: Referral - Request for Referral >> Dec 02, 2022 10:14 AM Patsy Lager T wrote: Has patient seen PCP for this complaint? Yes.   *If NO, is insurance requiring patient see PCP for this issue before PCP can refer them? Referral for which specialty: Radiologist Preferred provider/office: unkknown Reason for referral: Mammogram  Patient said she need referral due to surgery she has had on her breast

## 2022-12-03 NOTE — Telephone Encounter (Signed)
LMTCB, PEC Triage Nurse may give patient results/message  Need to know where she wants her mammogram

## 2022-12-04 NOTE — Telephone Encounter (Signed)
Patient advised.

## 2022-12-04 NOTE — Addendum Note (Signed)
Addended by: Erasmo Downer on: 12/04/2022 10:44 AM   Modules accepted: Orders

## 2022-12-04 NOTE — Telephone Encounter (Signed)
CRM # [id number] Owner: None Status: Unresolved Priority: Routine Created on: 12/03/2022 04:46 PM By: Cheri Guppy   Primary Information  Source  Kandace Parkins D (Patient)   Subject  Kandace Parkins D (Patient)   Topic  General - Other     Communication  Reason for CRM: Pt is calling in returning a call from the office

## 2022-12-05 ENCOUNTER — Other Ambulatory Visit: Payer: Self-pay | Admitting: Family Medicine

## 2022-12-05 DIAGNOSIS — J40 Bronchitis, not specified as acute or chronic: Secondary | ICD-10-CM

## 2022-12-15 ENCOUNTER — Telehealth: Payer: Self-pay

## 2022-12-15 NOTE — Telephone Encounter (Signed)
Copied from CRM [id number]. Topic: General - Other >> Dec 15, 2022 11:08 AM Epimenio Foot F wrote: Reason for CRM: Pt is calling in because she wanted to know the status on her paperwork for her Handicap license tag. Please advise.

## 2022-12-16 ENCOUNTER — Other Ambulatory Visit: Payer: Self-pay | Admitting: Pharmacist

## 2022-12-16 ENCOUNTER — Other Ambulatory Visit: Payer: Self-pay | Admitting: Family Medicine

## 2022-12-16 ENCOUNTER — Ambulatory Visit: Payer: Self-pay

## 2022-12-16 ENCOUNTER — Ambulatory Visit: Payer: Self-pay | Admitting: *Deleted

## 2022-12-16 DIAGNOSIS — R809 Proteinuria, unspecified: Secondary | ICD-10-CM

## 2022-12-16 DIAGNOSIS — E1129 Type 2 diabetes mellitus with other diabetic kidney complication: Secondary | ICD-10-CM

## 2022-12-16 MED ORDER — DAPAGLIFLOZIN PROPANEDIOL 10 MG PO TABS: 10.0000 mg | ORAL_TABLET | Freq: Every day | ORAL | 3 refills | Status: DC

## 2022-12-16 NOTE — Progress Notes (Signed)
Received voicemail from patient. Called back, left voicemail for her to

## 2022-12-16 NOTE — Telephone Encounter (Signed)
Covering for [employer]. Will collaborate with pharmacy technician team to outreach [place] [employer] [place] to determine status of Alexis Lloyd - if refill script is needed, if order is in process, etc.

## 2022-12-16 NOTE — Telephone Encounter (Signed)
     Chief Complaint: Pt. Wants PCP to know blood glucose still high without her [place]. Fasting this morning - 305. After taking glipizide - 263. Symptoms: Mild headache Frequency: This week Pertinent Negatives: Patient denies  Disposition: [] ED /[] Urgent Care (no appt availability in office) / [] Appointment(In office/virtual)/ []  [place] [facility]/ [] Home Care/ [] Refused Recommended Disposition /[] [place] [facility]/ [x]  Follow-up with PCP Additional Notes: Asking for help with [place]. Looks PCP aware and working on this.  Reason for Disposition  [1] Blood glucose > 300 mg/dL (96.2 mmol/L) AND [9] two or more times in a row  Answer Assessment - Initial Assessment Questions 1. BLOOD GLUCOSE: "What is your blood glucose level?"      305 fasting   now 263 now 2. ONSET: "When did you check the blood glucose?"     Today 3. USUAL RANGE: "What is your glucose level usually?" (e.g., usual fasting morning value, usual evening value)     150 4. KETONES: "Do you check for ketones (urine or blood test strips)?" If Yes, ask: "What does the test show now?"      No 5. TYPE 1 or 2:  "Do you know what type of diabetes you have?"  (e.g., Type 1, Type 2, Gestational; doesn't know)      Type 2 6. INSULIN: "Do you take insulin?" "What type of insulin(s) do you use? What is the mode of delivery? (syringe, pen; injection or pump)?"      No 7. DIABETES PILLS: "Do you take any pills for your diabetes?" If Yes, ask: "Have you missed taking any pills recently?"     Yes 8. OTHER SYMPTOMS: "Do you have any symptoms?" (e.g., fever, frequent urination, difficulty breathing, dizziness, weakness, vomiting)     Headache yesterday 9. PREGNANCY: "Is there any chance you are pregnant?" "When was your last menstrual period?"     No  Protocols used: Diabetes - High Blood Sugar-A-AH

## 2022-12-16 NOTE — Telephone Encounter (Signed)
Pt just called in regarding her glucose being high.   I read her the message from Merita Norton,, FNP dated 12/16/2022 at 11:41 AM.  She said that is not the problem that her blood sugar is high.   She is out of her [place].  It was sent to [facility] instead of to the free pharmacy where she gets her medications.  She mentioned "Alex" in the pharmacy usually orders her medications and doesn't know what happened this time.   It got all messed up.  She also asked about paperwork for a handicapped license plate.    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    She has not heard anything back from the office.  I let her know I would pass this information along to Merita Norton, FNP. Reason for Disposition  [1] Follow-up call to recent contact AND [2] information only call, no triage required  Answer Assessment - Initial Assessment Questions 1. REASON FOR CALL or QUESTION: "What is your reason for calling today?" or "How can I best help you?" or "What question do you have that I can help answer?"     Inquiring about the [place].   She is out of it.   It was sent to wrong pharmacy instead of the free pharmacy where she usually gets her medications.l   Also wanting follow up on handicapped plate for her car.    Paperwork was sent to office but not heard back.  Protocols used: Information Only Call - No Triage-A-AH

## 2022-12-16 NOTE — Telephone Encounter (Signed)
Contacted patient to discuss [place]. Left voicemail for patient to return my call.   Do we have any samples of 5 mg dose? Could take a couple of weeks for paperwork to be processed by [employer].

## 2022-12-16 NOTE — Progress Notes (Signed)
Care Coordination Call  Received message from the office that patient is out of Marcelline Deist, has not received a refill from [employer]. Pharmacy technician contacted [employer] this morning, was told that patient was denied for assistance on October of 2023 due to being over income.   Contacted patient. She notes that she submitted proof of income for this already with Alex/Bessie. This is not documented.   Assisted patient in downloading free 30 day trial card for [place]. Collaborated with PCP to send script to the pharmacy. They are preparing a free 30 day supply for patient.   Contacted [employer] again. They confirm that patient is not currently enrolled and needs to submit a new application with proof of income. Contacted patient and notified. She will look out for paperwork being mailed to her and will return with proof of income.   Catie Eppie Gibson, PharmD, BCACP, CPP Clinical Pharmacist [facility] [phone]

## 2022-12-16 NOTE — Telephone Encounter (Signed)
  Chief Complaint: Marcelline Deist sent to wrong phar.    Follow up on paperwork for a handicapped tag Symptoms: Blood glucose elevated because out of [place]. Frequency: N/A Pertinent Negatives: Patient denies N//A Disposition: [] ED /[] Urgent Care (no appt availability in office) / [] Appointment(In office/virtual)/ []  [place] [facility]/ [] Home Care/ [] Refused Recommended Disposition /[] [place] [facility]/ [x]  Follow-up with PCP Additional Notes: Message sent to Merita Norton, NP

## 2022-12-17 ENCOUNTER — Telehealth: Payer: Self-pay

## 2022-12-17 NOTE — Telephone Encounter (Signed)
Patient advised. Verbalized understanding. Does not understand why she can not have more than one form completed for tag because she has 3 vehicles. States she does not always use the same vehicle. She reports she will also pick up farxiga samples soon.

## 2022-12-17 NOTE — Telephone Encounter (Signed)
Attempted to enroll patient for [place] patient assistance with [employer] via online portal.   Patient denied due to income criteria not met.  Patient was previously denied Oct 2023 with same denial.

## 2022-12-17 NOTE — Telephone Encounter (Signed)
Duplicate encounter. Patient advised. Samples available at front desk. Paperwork for pharmacy is being sent to patients home. Merita Norton, FNP reported she would not be signing for more then one tag

## 2022-12-18 ENCOUNTER — Ambulatory Visit: Payer: Self-pay | Admitting: *Deleted

## 2022-12-18 NOTE — Patient Outreach (Signed)
  Care Coordination   12/18/2022 Name: Alexis Lloyd MRN: [id number] DOB: 1960-07-29   Care Coordination Outreach Attempts:  An unsuccessful telephone outreach was attempted for a scheduled appointment today.  Follow Up Plan:  Additional outreach attempts will be made to offer the patient care coordination information and services.   Encounter Outcome:  No Answer   Care Coordination Interventions:  No, not indicated    Kemper Durie, RN, MSN, [facility] Care Management Care Management Coordinator [phone]

## 2022-12-19 DIAGNOSIS — E1165 Type 2 diabetes mellitus with hyperglycemia: Secondary | ICD-10-CM | POA: Diagnosis not present

## 2022-12-25 ENCOUNTER — Other Ambulatory Visit: Payer: Self-pay | Admitting: Family Medicine

## 2022-12-25 DIAGNOSIS — Z1231 Encounter for screening mammogram for malignant neoplasm of breast: Secondary | ICD-10-CM

## 2022-12-25 DIAGNOSIS — Z853 Personal history of malignant neoplasm of breast: Secondary | ICD-10-CM

## 2022-12-26 ENCOUNTER — Inpatient Hospital Stay
Admission: RE | Admit: 2022-12-26 | Discharge: 2022-12-26 | Disposition: A | Discharge status: Home / Self Care | Payer: Self-pay | Source: Ambulatory Visit | Attending: Family Medicine | Admitting: Family Medicine

## 2022-12-26 ENCOUNTER — Other Ambulatory Visit: Payer: Self-pay | Admitting: *Deleted

## 2022-12-26 DIAGNOSIS — Z1231 Encounter for screening mammogram for malignant neoplasm of breast: Secondary | ICD-10-CM

## 2022-12-31 DIAGNOSIS — H2513 Age-related nuclear cataract, bilateral: Secondary | ICD-10-CM | POA: Diagnosis not present

## 2022-12-31 DIAGNOSIS — E119 Type 2 diabetes mellitus without complications: Secondary | ICD-10-CM | POA: Diagnosis not present

## 2022-12-31 DIAGNOSIS — H353133 Nonexudative age-related macular degeneration, bilateral, advanced atrophic without subfoveal involvement: Secondary | ICD-10-CM | POA: Diagnosis not present

## 2022-12-31 LAB — HM DIABETES EYE EXAM

## 2023-01-07 ENCOUNTER — Encounter: Payer: Self-pay | Admitting: Family Medicine

## 2023-01-07 ENCOUNTER — Ambulatory Visit: Payer: Self-pay | Admitting: *Deleted

## 2023-01-07 NOTE — Patient Outreach (Signed)
  Care Coordination   Follow Up Visit Note   01/07/2023 Name: Alexis Lloyd MRN: [id number] DOB: 04/07/1961  Alexis Lloyd is a 62 y.o. year old female who sees Alexis Kindle, FNP for primary care. I spoke with  Alexis Lloyd by phone today.  What matters to the patients health and wellness today?  Decrease A1C and weight     Goals Addressed             This Visit's Progress    RNCM: Effective Management of DM   On track    Care Coordination Interventions:   Provided education to patient about basic DM disease process Reviewed medications with patient and discussed importance of medication adherence Counseled on importance of regular laboratory monitoring as prescribed.   Discussed plans with patient for ongoing care management follow up and provided patient with direct contact information for care management team Provided patient with written educational materials related to hypo and hyperglycemia and importance of correct treatment.   Advised patient, providing education and rationale, to check cbg BID  and record, calling pcp or endocrinologist  for findings outside established parameters. Education on the goal of fasting of <130 and post prandial of <180. Education and support given Review of patient status, including review of consultants reports, relevant laboratory and other test results, and medications completed.  The patient agrees to the care coordination program and working with the [facility] for help in managing DM and other chronic conditions. The patient knows how to reach the [facility] and knows the [facility] will reach out on a regular basis.  Reviewed decrease of A1C, discussed plan to continue decreasing (diet change, exercise, increasing water intake).          SDOH assessments and interventions completed:  No     Care Coordination Interventions:  Yes, provided   Interventions Today    Flowsheet Row Most Recent Value  Chronic Disease   Chronic disease during  today's visit Diabetes  General Interventions   General Interventions Discussed/Reviewed General Interventions Reviewed, Doctor Visits, Labs, Annual Eye Exam  [Report eye exam was done last week]  Labs Hgb A1c every 3 months  [A1C remains elevated at 8.6, aware of goal of 7]  Doctor Visits Discussed/Reviewed Doctor Visits Reviewed, Specialist, PCP  [Endocrine 9/17, advised of need for follow up with PCP, will call to schedule]  PCP/Specialist Visits Compliance with follow-up visit  Exercise Interventions   Exercise Discussed/Reviewed Weight Managment  Weight Management Weight loss  [down 25 pounds]  Education Interventions   Education Provided Provided Education  Provided Verbal Education On Blood Sugar Monitoring, Nutrition, When to see the doctor, Medication  [Remains on Ozempic, no side effects.  blood sugar range 80s-130s. Discussed bedtime snack to decrease risk of hypoglycemia in the morning]  Nutrition Interventions   Nutrition Discussed/Reviewed Adding fruits and vegetables, Nutrition Reviewed, Carbohydrate meal planning, Decreasing sugar intake, Increasing proteins       Follow up plan: Follow up call scheduled for 9/27    Encounter Outcome:  Pt. Visit Completed   Kemper Durie, RN, MSN, [facility] Care Management Care Management Coordinator [phone]

## 2023-01-07 NOTE — Patient Outreach (Signed)
  Care Coordination   01/07/2023 Name: Alexis Lloyd MRN: [id number] DOB: 1961-04-09   Care Coordination Outreach Attempts:  An unsuccessful telephone outreach was attempted for a scheduled appointment today.  Follow Up Plan:  Additional outreach attempts will be made to offer the patient care coordination information and services.   Encounter Outcome:  No Answer   Care Coordination Interventions:  No, not indicated    Kemper Durie, RN, MSN, [facility] Care Management Care Management Coordinator [phone]

## 2023-01-16 ENCOUNTER — Other Ambulatory Visit: Payer: Self-pay | Admitting: Family Medicine

## 2023-01-16 DIAGNOSIS — E1165 Type 2 diabetes mellitus with hyperglycemia: Secondary | ICD-10-CM

## 2023-01-16 NOTE — Telephone Encounter (Signed)
[facility] faxed refill request for the following medications:   glipiZIDE (GLUCOTROL) 10 MG tablet     Please advise.

## 2023-01-19 DIAGNOSIS — E1165 Type 2 diabetes mellitus with hyperglycemia: Secondary | ICD-10-CM | POA: Diagnosis not present

## 2023-01-20 ENCOUNTER — Encounter: Payer: Self-pay | Admitting: Family Medicine

## 2023-01-20 ENCOUNTER — Telehealth: Payer: Self-pay | Admitting: Family Medicine

## 2023-01-20 ENCOUNTER — Other Ambulatory Visit: Payer: Self-pay | Admitting: Family Medicine

## 2023-01-20 DIAGNOSIS — Z1211 Encounter for screening for malignant neoplasm of colon: Secondary | ICD-10-CM

## 2023-01-20 DIAGNOSIS — I10 Essential (primary) hypertension: Secondary | ICD-10-CM

## 2023-01-20 MED ORDER — LISINOPRIL-HYDROCHLOROTHIAZIDE 20-12.5 MG PO TABS
1.0000 | ORAL_TABLET | Freq: Every day | ORAL | 0 refills | Status: DC
Start: 2023-01-20 — End: 2023-07-14

## 2023-01-20 MED ORDER — GLIPIZIDE 10 MG PO TABS
10.0000 mg | ORAL_TABLET | Freq: Two times a day (BID) | ORAL | 3 refills | Status: DC
Start: 2023-01-20 — End: 2023-02-05

## 2023-01-20 NOTE — Telephone Encounter (Signed)
[name] is calling in because pt told them that Alexis Lloyd changed her dosage of lisinopril-hydrochlorothiazide (ZESTORETIC) 20-12.5 MG tablet [[id number]] to once per day instead of two pills and says she just needed an updated prescription to reflect that. Hope says if there are any questions she can be reached at [phone]

## 2023-01-20 NOTE — Telephone Encounter (Signed)
Please review

## 2023-01-20 NOTE — Telephone Encounter (Signed)
LOV: 06/2022 for acute visit Patient is followed by endo for diabetes. Last A1C was 8.6 10/22/22

## 2023-01-20 NOTE — Telephone Encounter (Signed)
Received another refill request from [facility] for this medication.  Looks like it was sent to [facility] instead of [facility].  Can we sent to the correct pharmacy

## 2023-01-21 ENCOUNTER — Ambulatory Visit (INDEPENDENT_AMBULATORY_CARE_PROVIDER_SITE_OTHER): Payer: Medicare HMO

## 2023-01-21 VITALS — Ht 58.5 in | Wt 212.0 lb

## 2023-01-21 DIAGNOSIS — Z0000 Encounter for general adult medical examination without abnormal findings: Secondary | ICD-10-CM | POA: Diagnosis not present

## 2023-01-21 NOTE — Patient Instructions (Signed)
Ms. Senft , Thank you for taking time to come for your Medicare Wellness Visit. I appreciate your ongoing commitment to your health goals. Please review the following plan we discussed and let me know if I can assist you in the future.   Referrals/Orders/Follow-Ups/Clinician Recommendations: none  This is a list of the screening recommended for you and due dates:  Health Maintenance  Topic Date Due   Zoster (Shingles) Vaccine (2 of 2) 10/10/2021   COVID-19 Vaccine (5 - 2023-24 season) 02/07/2022   Complete foot exam   07/15/2022   Yearly kidney health urinalysis for diabetes  11/30/2022   Flu Shot  01/08/2023   Hemoglobin A1C  04/24/2023   Yearly kidney function blood test for diabetes  05/30/2023   Mammogram  12/17/2023   Eye exam for diabetics  12/31/2023   Medicare Annual Wellness Visit  01/21/2024   Pap Smear  05/17/2024   DTaP/Tdap/Td vaccine (2 - Td or Tdap) 09/01/2027   Colon Cancer Screening  09/12/2027   Hepatitis C Screening  Completed   HIV Screening  Completed   HPV Vaccine  Aged Out    Advanced directives: (Declined) Advance directive discussed with you today. Even though you declined this today, please call our office should you change your mind, and we can give you the proper paperwork for you to fill out.  Next Medicare Annual Wellness Visit scheduled for next year: Yes 01/25/2024 @ 11:45am telephone  Preventive Care 40-64 Years, Female Preventive care refers to lifestyle choices and visits with your health care provider that can promote health and wellness. What does preventive care include? A yearly physical exam. This is also called an annual well check. Dental exams once or twice a year. Routine eye exams. Ask your health care provider how often you should have your eyes checked. Personal lifestyle choices, including: Daily care of your teeth and gums. Regular physical activity. Eating a healthy diet. Avoiding tobacco and drug use. Limiting alcohol  use. Practicing safe sex. Taking low-dose aspirin daily starting at age 33. Taking vitamin and mineral supplements as recommended by your health care provider. What happens during an annual well check? The services and screenings done by your health care provider during your annual well check will depend on your age, overall health, lifestyle risk factors, and family history of disease. Counseling  Your health care provider may ask you questions about your: Alcohol use. Tobacco use. Drug use. Emotional well-being. Home and relationship well-being. Sexual activity. Eating habits. Work and work Astron[employer]. Method of birth control. Menstrual cycle. Pregnancy history. Screening  You may have the following tests or measurements: Height, weight, and BMI. Blood pressure. Lipid and cholesterol levels. These may be checked every 5 years, or more frequently if you are over 2 years old. Skin check. Lung cancer screening. You may have this screening every year starting at age 103 if you have a 30-pack-year history of smoking and currently smoke or have quit within the past 15 years. Fecal occult blood test (FOBT) of the stool. You may have this test every year starting at age 79. Flexible sigmoidoscopy or colonoscopy. You may have a sigmoidoscopy every 5 years or a colonoscopy every 10 years starting at age 45. Hepatitis C blood test. Hepatitis B blood test. Sexually transmitted disease (STD) testing. Diabetes screening. This is done by checking your blood sugar (glucose) after you have not eaten for a while (fasting). You may have this done every 1-3 years. Mammogram. This may be done every 1-2  years. Talk to your health care provider about when you should start having regular mammograms. This may depend on whether you have a family history of breast cancer. BRCA-related cancer screening. This may be done if you have a family history of breast, ovarian, tubal, or peritoneal cancers. Pelvic  exam and Pap test. This may be done every 3 years starting at age 10. Starting at age 62, this may be done every 5 years if you have a Pap test in combination with an HPV test. Bone density scan. This is done to screen for osteoporosis. You may have this scan if you are at high risk for osteoporosis. Discuss your test results, treatment options, and if necessary, the need for more tests with your health care provider. Vaccines  Your health care provider may recommend certain vaccines, such as: Influenza vaccine. This is recommended every year. Tetanus, diphtheria, and acellular pertussis (Tdap, Td) vaccine. You may need a Td booster every 10 years. Zoster vaccine. You may need this after age 71. Pneumococcal 13-valent conjugate (PCV13) vaccine. You may need this if you have certain conditions and were not previously vaccinated. Pneumococcal polysaccharide (PPSV23) vaccine. You may need one or two doses if you smoke cigarettes or if you have certain conditions. Talk to your health care provider about which screenings and vaccines you need and how often you need them. This information is not intended to replace advice given to you by your health care provider. Make sure you discuss any questions you have with your health care provider. Document Released: 06/22/2015 Document Revised: 02/13/2016 Document Reviewed: 03/27/2015 Elsevier Interactive Patient Education  2017 [employer].    Fall Prevention in the Home Falls can cause injuries. They can happen to people of all ages. There are many things you can do to make your home safe and to help prevent falls. What can I do on the outside of my home? Regularly fix the edges of walkways and driveways and fix any cracks. Remove anything that might make you trip as you walk through a door, such as a raised step or threshold. Trim any bushes or trees on the path to your home. Use bright outdoor lighting. Clear any walking paths of anything that might  make someone trip, such as rocks or tools. Regularly check to see if handrails are loose or broken. Make sure that both sides of any steps have handrails. Any raised decks and porches should have guardrails on the edges. Have any leaves, snow, or ice cleared regularly. Use sand or salt on walking paths during winter. Clean up any spills in your garage right away. This includes oil or grease spills. What can I do in the bathroom? Use night lights. Install grab bars by the toilet and in the tub and shower. Do not use towel bars as grab bars. Use non-skid mats or decals in the tub or shower. If you need to sit down in the shower, use a plastic, non-slip stool. Keep the floor dry. Clean up any water that spills on the floor as soon as it happens. Remove soap buildup in the tub or shower regularly. Attach bath mats securely with double-sided non-slip rug tape. Do not have throw rugs and other things on the floor that can make you trip. What can I do in the bedroom? Use night lights. Make sure that you have a light by your bed that is easy to reach. Do not use any sheets or blankets that are too big for your bed. They  should not hang down onto the floor. Have a firm chair that has side arms. You can use this for support while you get dressed. Do not have throw rugs and other things on the floor that can make you trip. What can I do in the kitchen? Clean up any spills right away. Avoid walking on wet floors. Keep items that you use a lot in easy-to-reach places. If you need to reach something above you, use a strong step stool that has a grab bar. Keep electrical cords out of the way. Do not use floor polish or wax that makes floors slippery. If you must use wax, use non-skid floor wax. Do not have throw rugs and other things on the floor that can make you trip. What can I do with my stairs? Do not leave any items on the stairs. Make sure that there are handrails on both sides of the stairs  and use them. Fix handrails that are broken or loose. Make sure that handrails are as long as the stairways. Check any carpeting to make sure that it is firmly attached to the stairs. Fix any carpet that is loose or worn. Avoid having throw rugs at the top or bottom of the stairs. If you do have throw rugs, attach them to the floor with carpet tape. Make sure that you have a light switch at the top of the stairs and the bottom of the stairs. If you do not have them, ask someone to add them for you. What else can I do to help prevent falls? Wear shoes that: Do not have high heels. Have rubber bottoms. Are comfortable and fit you well. Are closed at the toe. Do not wear sandals. If you use a stepladder: Make sure that it is fully opened. Do not climb a closed stepladder. Make sure that both sides of the stepladder are locked into place. Ask someone to hold it for you, if possible. Clearly mark and make sure that you can see: Any grab bars or handrails. First and last steps. Where the edge of each step is. Use tools that help you move around (mobility aids) if they are needed. These include: Canes. Walkers. Scooters. Crutches. Turn on the lights when you go into a dark area. Replace any light bulbs as soon as they burn out. Set up your furniture so you have a clear path. Avoid moving your furniture around. If any of your floors are uneven, fix them. If there are any pets around you, be aware of where they are. Review your medicines with your doctor. Some medicines can make you feel dizzy. This can increase your chance of falling. Ask your doctor what other things that you can do to help prevent falls. This information is not intended to replace advice given to you by your health care provider. Make sure you discuss any questions you have with your health care provider. Document Released: 03/22/2009 Document Revised: 11/01/2015 Document Reviewed: 06/30/2014 Elsevier Interactive Patient  Education  2017 [employer].

## 2023-01-21 NOTE — Progress Notes (Signed)
Subjective:   Alexis Lloyd is a 62 y.o. female who presents for Medicare Annual (Subsequent) preventive examination.  Visit Complete: Virtual  I connected with  [name] on 01/21/23 by a audio enabled telemedicine application and verified that I am speaking with the correct person using two identifiers.  Patient Location: Home  Provider Location: Office/Clinic  I discussed the limitations of evaluation and management by telemedicine. The patient expressed understanding and agreed to proceed.  Vital Signs: Unable to obtain new vitals due to this being a telehealth visit.  Vital Signs: Unable to obtain new vitals due to this being a telehealth visit.  Patient Medicare AWV questionnaire was completed by the patient on (not done); I have confirmed that all information answered by patient is correct and no changes since this date.  Review of Systems    Cardiac Risk Factors include: advanced age (>92men, >5 women);diabetes mellitus;dyslipidemia;hypertension;obesity (BMI >30kg/m2)    Objective:    Today's Vitals   01/21/23 1144  Weight: 212 lb (96.2 kg)  Height: 4' 10.5" (1.486 m)   Body mass index is 43.55 kg/m.     01/21/2023   11:51 AM 12/26/2021    1:27 PM 06/12/2020    4:10 PM 06/10/2020    5:48 PM 10/28/2019    1:43 PM 02/21/2018    5:54 PM 09/11/2017    8:23 AM  Advanced Directives  Does Patient Have a Medical Advance Directive? No No  No No No No  Would patient like information on creating a medical advance directive?  No - Patient declined No - Patient declined  No - Patient declined  No - Patient declined    Current Medications (verified) Outpatient Encounter Medications as of 01/21/2023  Medication Sig   albuterol (VENTOLIN HFA) 108 (90 Base) MCG/ACT inhaler Inhale 2 puffs into the lungs every 6 (six) hours as needed.   aspirin EC 81 MG tablet Take 1 tablet (81 mg total) by mouth daily. Swallow whole.   Blood Glucose Monitoring Suppl (ONETOUCH VERIO) w/Device  KIT To check blood sugar once daily   conjugated estrogens (PREMARIN) vaginal cream Place 1 Applicatorful vaginally daily.   Continuous Blood Gluc Receiver (FREESTYLE LIBRE 14 DAY READER) DEVI Use per package directions.   dapagliflozin propanediol (FARXIGA) 10 MG TABS tablet Take 1 tablet (10 mg total) by mouth daily before breakfast. Patient receives via [employer] patient assistance   glipiZIDE (GLUCOTROL) 10 MG tablet Take 1 tablet (10 mg total) by mouth 2 (two) times daily before a meal.   glucose blood (ONETOUCH VERIO) test strip To check blood sugar once daily   ibuprofen (ADVIL) 200 MG tablet Take 400 mg by mouth every 6 (six) hours as needed.   insulin degludec (TRESIBA FLEXTOUCH) 100 UNIT/ML FlexTouch Pen Inject 24 Units into the skin at bedtime.   Insulin Pen Needle (PEN NEEDLES) 32G X 6 MM MISC 1 each by Does not apply route at bedtime.   Lancets (ONETOUCH ULTRASOFT) lancets Use as instructed   lisinopril-hydrochlorothiazide (ZESTORETIC) 20-12.5 MG tablet Take 1 tablet by mouth daily.   loperamide (IMODIUM) 2 MG capsule Take 2 capsules (4 mg total) by mouth 4 (four) times daily as needed for diarrhea or loose stools.   montelukast (SINGULAIR) 10 MG tablet Take 1 tablet by mouth once daily   Multiple Vitamins-Minerals (PRESERVISION AREDS 2) CAPS Take 1 capsule by mouth daily.   pantoprazole (PROTONIX) 40 MG tablet Take 1 tablet (40 mg total) by mouth daily.   rosuvastatin (CRESTOR)  10 MG tablet Take 1 tablet by mouth once daily   scopolamine (TRANSDERM-SCOP) 1 MG/3DAYS Place 1 patch (1.5 mg total) onto the skin every 3 (three) days.   Semaglutide,0.25 or 0.5MG /DOS, (OZEMPIC, 0.25 OR 0.5 MG/DOSE,) 2 MG/1.5ML SOPN Inject 0.25 mg into the skin weekly for four weeks, the increase to 0.5 mg weekly.   spironolactone (ALDACTONE) 25 MG tablet Take 1 tablet (25 mg total) by mouth daily.   SUMAtriptan (IMITREX) 50 MG tablet Take 1 tablet (50 mg total) by mouth every 2 (two) hours as needed for  migraine. No more than 200mg  in 24 hours   No facility-administered encounter medications on file as of 01/21/2023.    Allergies (verified) Allegra [fexofenadine], Avelox [moxifloxacin], Bactrim [sulfamethoxazole-trimethoprim], Etodolac, Fish-derived products, Penicillins, Shellfish allergy, Sulfa antibiotics, and Macrobid [nitrofurantoin monohyd macro]   History: Past Medical History:  Diagnosis Date   Allergy    Arthritis    Breast cancer (HCC)    Breast cancer, left breast (HCC) 10/04/2014   Diabetes mellitus without complication (HCC)    GERD (gastroesophageal reflux disease)    Hyperlipidemia    Hypertension    Osteoporosis    Sleep apnea    Past Surgical History:  Procedure Laterality Date   ABDOMINAL HYSTERECTOMY     due to fibroid tumors 1990 Dr. Gar Ponto   BREAST BIOPSY Left    negative   BREAST BIOPSY Left    04/2013 positive   BREAST CYST ASPIRATION Left    BREAST EXCISIONAL BIOPSY Left    2015   BREAST LUMPECTOMY Left 06/17/2013   BREAST REDUCTION SURGERY Bilateral    CESAREAN SECTION     x's 2   CHOLECYSTECTOMY  09/2001   COLONOSCOPY WITH PROPOFOL N/A 09/11/2017   Procedure: COLONOSCOPY WITH PROPOFOL;  Surgeon: Pasty Spillers, MD;  Location: [facility] ENDOSCOPY;  Service: Endoscopy;  Laterality: N/A;   ESOPHAGOGASTRODUODENOSCOPY (EGD) WITH PROPOFOL N/A 09/11/2017   Procedure: ESOPHAGOGASTRODUODENOSCOPY (EGD) WITH PROPOFOL;  Surgeon: Pasty Spillers, MD;  Location: [facility] ENDOSCOPY;  Service: Endoscopy;  Laterality: N/A;   REDUCTION MAMMAPLASTY     s/p radiation therapy Left    2015   Family History  Problem Relation Age of Onset   Hypertension Mother    Cancer Mother        breast and rectal cancer   Breast cancer Mother 23   Asthma Brother    Hypertension Brother    Allergic rhinitis Brother    Breast cancer Maternal Grandmother    Social History   Socioeconomic History   Marital status: Married    Spouse name: Ethelene Browns   Number of children: 2    Years of education: some college   Highest education level: Not on file  Occupational History    Employer: DISABLED  Tobacco Use   Smoking status: Former    Current packs/day: 0.00    Average packs/day: 0.3 packs/day for 21.0 years (5.3 ttl pk-yrs)    Types: Cigarettes    Start date: 06/08/1976    Quit date: 06/08/1997    Years since quitting: 25.6   Smokeless tobacco: Never  Vaping Use   Vaping status: Never Used  Substance and Sexual Activity   Alcohol use: No   Drug use: No   Sexual activity: Yes    Birth control/protection: Surgical  Other Topics Concern   Not on file  Social History Narrative   Left handed   Lives in a 2 story home with husband   Social Determinants of Health  Financial Resource Strain: Low Risk  (01/21/2023)   Overall Financial Resource Strain (CARDIA)    Difficulty of Paying Living Expenses: Not hard at all  Food Insecurity: No Food Insecurity (01/21/2023)   Hunger Vital Sign    Worried About Running Out of Food in the Last Year: Never true    Ran Out of Food in the Last Year: Never true  Transportation Needs: No Transportation Needs (01/21/2023)   PRAPARE - Administrator, Civil Service (Medical): No    Lack of Transportation (Non-Medical): No  Physical Activity: Insufficiently Active (01/21/2023)   Exercise Vital Sign    Days of Exercise per Week: 3 days    Minutes of Exercise per Session: 40 min  Stress: No Stress Concern Present (01/21/2023)   [employer] of Occupational Health - Occupational Stress Questionnaire    Feeling of Stress : Not at all  Social Connections: Socially Integrated (01/21/2023)   Social Connection and Isolation Panel [NHANES]    Frequency of Communication with Friends and Family: More than three times a week    Frequency of Social Gatherings with Friends and Family: More than three times a week    Attends Religious Services: More than 4 times per year    Active Member of [employer] or Organizations: Yes     Attends Engineer, structural: More than 4 times per year    Marital Status: Married    Tobacco Counseling Counseling given: Not Answered   Clinical Intake:  Pre-visit preparation completed: Yes  Pain : No/denies pain     BMI - recorded: 43.55 Nutritional Status: BMI > 30  Obese Nutritional Risks: None Diabetes: Yes CBG done?: No Did pt. bring in CBG monitor from home?: No  How often do you need to have someone help you when you read instructions, pamphlets, or other written materials from your doctor or pharmacy?: 1 - Never  Interpreter Needed?: No  Comments: lives with husband Information entered by :: B.,LPN   Activities of Daily Living    01/21/2023   11:51 AM 06/20/2022    1:43 PM  In your present state of health, do you have any difficulty performing the following activities:  Hearing? 0 0  Vision? 0 0  Difficulty concentrating or making decisions? 0 0  Walking or climbing stairs? 0 1  Dressing or bathing? 0 1  Doing errands, shopping? 0 0  Preparing Food and eating ? N   Using the Toilet? N   In the past six months, have you accidently leaked urine? N   Do you have problems with loss of bowel control? N   Managing your Medications? N   Managing your Finances? N   Housekeeping or managing your Housekeeping? N     Patient Care Team: Jacky Kindle, FNP as PCP - General (Family Medicine) Mirna Mires, CNM as Midwife (Obstetrics) Sherlon Handing, MD as Consulting Physician (Endocrinology) Kemper Durie, RN as [facility] Care Management Delles, Jackelyn Poling, RPH-CPP (Pharmacist) Pa, [facility] (Optometry)  Indicate any recent Medical Services you may have received from other than [facility] providers in the past year (date may be approximate).     Assessment:   This is a routine wellness examination for Elga.  Hearing/Vision screen Hearing Screening - Comments:: Adequate hearing Vision Screening - Comments::  Adequate vision w/glasses [facility]  Dietary issues and exercise activities discussed:     Goals Addressed  This Visit's Progress    Monitor and Manage My Blood Sugar-Diabetes Type 2   On track    Timeframe:  Long-Range Goal Priority:  High Start Date:    07/23/2020                         Expected End Date:   01/21/2023                    Follow Up within 30 days    - check blood sugar at prescribed times - check blood sugar if I feel it is too high or too low - take the blood sugar log to all doctor visits    Why is this important?   Checking your blood sugar at home helps to keep it from getting very high or very low.  Writing the results in a diary or log helps the doctor know how to care for you.  Your blood sugar log should have the time, date and the results.  Also, write down the amount of insulin or other medicine that you take.  Other information, like what you ate, exercise done and how you were feeling, will also be helpful.     Notes:      RNCM: Effective Management of DM   On track    Care Coordination Interventions:   Provided education to patient about basic DM disease process Reviewed medications with patient and discussed importance of medication adherence Counseled on importance of regular laboratory monitoring as prescribed.   Discussed plans with patient for ongoing care management follow up and provided patient with direct contact information for care management team Provided patient with written educational materials related to hypo and hyperglycemia and importance of correct treatment.   Advised patient, providing education and rationale, to check cbg BID  and record, calling pcp or endocrinologist  for findings outside established parameters. Education on the goal of fasting of <130 and post prandial of <180. Education and support given Review of patient status, including review of consultants reports, relevant laboratory and other test  results, and medications completed.  The patient agrees to the care coordination program and working with the [facility] for help in managing DM and other chronic conditions. The patient knows how to reach the [facility] and knows the [facility] will reach out on a regular basis.  Reviewed decrease of A1C, discussed plan to continue decreasing (diet change, exercise, increasing water intake).         Depression Screen    01/21/2023   11:50 AM 06/20/2022    1:43 PM 05/29/2022   10:57 AM 04/10/2022    3:17 PM 04/03/2022    2:36 PM 03/03/2022    4:03 PM 12/26/2021    1:26 PM  PHQ 2/9 Scores  PHQ - 2 Score 0 0 0 0 0 0 0  PHQ- 9 Score  0 4 0 0      Fall Risk    01/21/2023   11:47 AM 06/20/2022    1:43 PM 05/29/2022   10:57 AM 04/10/2022    3:17 PM 04/03/2022    2:36 PM  Fall Risk   Falls in the past year? 0 0 0 0 0  Number falls in past yr: 0 0 0 0 0  Injury with Fall? 0 0 0 0 0  Risk for fall due to : No Fall Risks No Fall Risks  No Fall Risks No Fall Risks  Follow up Falls prevention  discussed;Education provided Falls evaluation completed  Falls evaluation completed Falls evaluation completed    MEDICARE RISK AT HOME:  Medicare Risk at Home - 01/21/23 1147     Any stairs in or around the home? Yes   stairlift   If so, are there any without handrails? No    Home free of loose throw rugs in walkways, pet beds, electrical cords, etc? Yes    Adequate lighting in your home to reduce risk of falls? Yes    Life alert? Yes    Use of a cane, walker or w/c? No    Grab bars in the bathroom? Yes    Shower chair or bench in shower? Yes    Elevated toilet seat or a handicapped toilet? Yes             TIMED UP AND GO:  Was the test performed?  No    Cognitive Function:        01/21/2023   11:53 AM  6CIT Screen  What Year? 0 points  What month? 0 points  What time? 0 points  Count back from 20 0 points  Months in reverse 0 points  Repeat phrase 0 points  Total Score 0 points     Immunizations Immunization History  Administered Date(s) Administered   PFIZER Comirnaty(Gray Top)Covid-19 Tri-Sucrose Vaccine 08/15/2020   PFIZER(Purple Top)SARS-COV-2 Vaccination 07/31/2019, 09/13/2019   Pfizer Covid-19 Vaccine Bivalent Booster 38yrs & up 03/28/2021   Pneumococcal Polysaccharide-23 06/22/2009, 08/06/2015   Tdap 08/31/2017   Zoster Recombinant(Shingrix) 08/15/2021    TDAP status: Up to date  Flu Vaccine status: Declined, Education has been provided regarding the importance of this vaccine but patient still declined. Advised may receive this vaccine at local pharmacy or Health Dept. Aware to provide a copy of the vaccination record if obtained from local pharmacy or Health Dept. Verbalized acceptance and understanding.  Pneumococcal vaccine status: Up to date  Covid-19 vaccine status: Completed vaccines  Qualifies for Shingles Vaccine? Yes   Zostavax completed Yes  #1 Shingrix Completed?: No.    Education has been provided regarding the importance of this vaccine. Patient has been advised to call insurance company to determine out of pocket expense if they have not yet received this vaccine. Advised may also receive vaccine at local pharmacy or Health Dept. Verbalized acceptance and understanding.  Screening Tests Health Maintenance  Topic Date Due   Zoster Vaccines- Shingrix (2 of 2) 10/10/2021   COVID-19 Vaccine (5 - 2023-24 season) 02/07/2022   FOOT EXAM  07/15/2022   Diabetic kidney evaluation - Urine ACR  11/30/2022   INFLUENZA VACCINE  01/08/2023   HEMOGLOBIN A1C  04/24/2023   Diabetic kidney evaluation - eGFR measurement  05/30/2023   MAMMOGRAM  12/17/2023   OPHTHALMOLOGY EXAM  12/31/2023   Medicare Annual Wellness (AWV)  01/21/2024   PAP SMEAR-Modifier  05/17/2024   DTaP/Tdap/Td (2 - Td or Tdap) 09/01/2027   Colonoscopy  09/12/2027   Hepatitis C Screening  Completed   HIV Screening  Completed   HPV VACCINES  Aged Out    Health  Maintenance  Health Maintenance Due  Topic Date Due   Zoster Vaccines- Shingrix (2 of 2) 10/10/2021   COVID-19 Vaccine (5 - 2023-24 season) 02/07/2022   FOOT EXAM  07/15/2022   Diabetic kidney evaluation - Urine ACR  11/30/2022   INFLUENZA VACCINE  01/08/2023    Colorectal cancer screening: Type of screening: Colonoscopy. Completed yes. Repeat every 5-10 years  Mammogram status: Completed yes.  Repeat every year  Lung Cancer Screening: (Low Dose CT Chest recommended if Age 47-80 years, 20 pack-year currently smoking OR have quit w/in 15years.) does not qualify.   Lung Cancer Screening Referral: no  Additional Screening:  Hepatitis C Screening: does not qualify; Completed yes  Vision Screening: Recommended annual ophthalmology exams for early detection of glaucoma and other disorders of the eye. Is the patient up to date with their annual eye exam?  Yes  Who is the provider or what is the name of the office in which the patient attends annual eye exams? [facility] If pt is not established with a provider, would they like to be referred to a provider to establish care? No .   Dental Screening: Recommended annual dental exams for proper oral hygiene  Diabetic Foot Exam: Diabetic Foot Exam: Completed yes 5/24  Community Resource Referral / Chronic Care Management: CRR required this visit?  No   CCM required this visit?  No     Plan:     I have personally reviewed and noted the following in the patient's chart:   Medical and social history Use of alcohol, tobacco or illicit drugs  Current medications and supplements including opioid prescriptions. Patient is not currently taking opioid prescriptions. Functional ability and status Nutritional status Physical activity Advanced directives List of other physicians Hospitalizations, surgeries, and ER visits in previous 12 months Vitals Screenings to include cognitive, depression, and falls Referrals and  appointments  In addition, I have reviewed and discussed with patient certain preventive protocols, quality metrics, and best practice recommendations. A written personalized care plan for preventive services as well as general preventive health recommendations were provided to patient.     Sue Lush, LPN   12/13/2374   After Visit Summary: (Declined) Due to this being a telephonic visit, with patients personalized plan was offered to patient but patient Declined AVS at this time   Nurse Notes: The patient states she is doing well and has no concerns or questions at this time.

## 2023-01-28 ENCOUNTER — Ambulatory Visit (INDEPENDENT_AMBULATORY_CARE_PROVIDER_SITE_OTHER): Payer: Medicare HMO | Admitting: Family Medicine

## 2023-01-28 DIAGNOSIS — Z91199 Patient's noncompliance with other medical treatment and regimen due to unspecified reason: Secondary | ICD-10-CM | POA: Insufficient documentation

## 2023-01-28 NOTE — Progress Notes (Unsigned)
Patient was not seen for appt d/t no call, no show, or late arrival >10 mins past appt time.   Elise T Payne, FNP  [facility] [address] [place], [place] [address] [phone] (phone) [phone] (fax) [facility]  

## 2023-01-29 ENCOUNTER — Ambulatory Visit (INDEPENDENT_AMBULATORY_CARE_PROVIDER_SITE_OTHER): Payer: Medicare HMO | Admitting: Family Medicine

## 2023-01-29 DIAGNOSIS — Z91199 Patient's noncompliance with other medical treatment and regimen due to unspecified reason: Secondary | ICD-10-CM

## 2023-01-29 NOTE — Progress Notes (Signed)
Patient was not seen for appt d/t no call, no show, or late arrival >10 mins past appt time.   Elise T Payne, FNP  [facility] [address] [place], [place] [address] [phone] (phone) [phone] (fax) [facility]  

## 2023-02-05 ENCOUNTER — Telehealth: Payer: Self-pay | Admitting: Family Medicine

## 2023-02-05 DIAGNOSIS — E1165 Type 2 diabetes mellitus with hyperglycemia: Secondary | ICD-10-CM

## 2023-02-05 MED ORDER — GLIPIZIDE 10 MG PO TABS
10.0000 mg | ORAL_TABLET | Freq: Two times a day (BID) | ORAL | 3 refills | Status: DC
Start: 2023-02-05 — End: 2023-09-08

## 2023-02-05 NOTE — Telephone Encounter (Signed)
 [facility] faxed refill request for the following medications:   glipiZIDE (GLUCOTROL) 10 MG tablet     Please advise.

## 2023-02-11 NOTE — Telephone Encounter (Signed)
Form being sent back for Verification of Chronic Condition. Please complete and fax to # on the bottom of form.

## 2023-02-18 ENCOUNTER — Other Ambulatory Visit: Payer: Self-pay | Admitting: Family Medicine

## 2023-02-18 ENCOUNTER — Telehealth: Payer: Self-pay | Admitting: Family Medicine

## 2023-02-18 DIAGNOSIS — E1165 Type 2 diabetes mellitus with hyperglycemia: Secondary | ICD-10-CM

## 2023-02-18 MED ORDER — FREESTYLE LIBRE 2 SENSOR MISC: 1.0000 | 11 refills | Status: DC

## 2023-02-18 MED ORDER — FREESTYLE LIBRE 2 READER DEVI: 1.0000 | 1 refills | Status: DC

## 2023-02-18 NOTE — Telephone Encounter (Signed)
Patient advised Dexcom left at front desk

## 2023-02-18 NOTE — Telephone Encounter (Signed)
Patient called in stated she has changed insurance from [place] to [place]. She use the Dexcon sensor and it run out tomorrow. Patient wants to see if she can come by the office to pickup one until she can get a script.

## 2023-02-19 ENCOUNTER — Telehealth: Payer: Self-pay | Admitting: Family Medicine

## 2023-02-19 NOTE — Telephone Encounter (Signed)
Alexis Lloyd with [employer] has called and stated they need verbal verification of patient's chronic condition, diabetes. Please advise.   Callback # [id number] REF #: [id number]

## 2023-02-20 ENCOUNTER — Telehealth: Payer: Self-pay | Admitting: Family Medicine

## 2023-02-20 NOTE — Telephone Encounter (Signed)
[facility] is requesting prior authorization Key: [id number] Name: [facility] 2 Reader Teresita Madura has been rejected and requires PA

## 2023-02-23 ENCOUNTER — Other Ambulatory Visit: Payer: Medicare HMO

## 2023-02-23 ENCOUNTER — Inpatient Hospital Stay: Admission: RE | Admit: 2023-02-23 | Payer: Medicare HMO | Source: Ambulatory Visit

## 2023-02-26 DIAGNOSIS — J449 Chronic obstructive pulmonary disease, unspecified: Secondary | ICD-10-CM | POA: Diagnosis not present

## 2023-02-26 DIAGNOSIS — E785 Hyperlipidemia, unspecified: Secondary | ICD-10-CM | POA: Diagnosis not present

## 2023-02-26 DIAGNOSIS — I152 Hypertension secondary to endocrine disorders: Secondary | ICD-10-CM | POA: Diagnosis not present

## 2023-02-26 DIAGNOSIS — E1169 Type 2 diabetes mellitus with other specified complication: Secondary | ICD-10-CM | POA: Diagnosis not present

## 2023-02-26 DIAGNOSIS — E1165 Type 2 diabetes mellitus with hyperglycemia: Secondary | ICD-10-CM | POA: Diagnosis not present

## 2023-02-26 DIAGNOSIS — E1159 Type 2 diabetes mellitus with other circulatory complications: Secondary | ICD-10-CM | POA: Diagnosis not present

## 2023-02-26 NOTE — Telephone Encounter (Signed)
Patient is followed by Endocrine for diabetes. Last seen here for diabetes 05/2022. Endo should be doing this PA

## 2023-02-26 NOTE — Telephone Encounter (Signed)
Received a fax from covermymeds for [employer] 2 Sensor  Key:  [id number]

## 2023-03-04 ENCOUNTER — Other Ambulatory Visit: Payer: Self-pay | Admitting: Family Medicine

## 2023-03-04 ENCOUNTER — Ambulatory Visit: Payer: Medicare HMO | Admitting: Family Medicine

## 2023-03-04 DIAGNOSIS — J40 Bronchitis, not specified as acute or chronic: Secondary | ICD-10-CM

## 2023-03-04 DIAGNOSIS — Z1231 Encounter for screening mammogram for malignant neoplasm of breast: Secondary | ICD-10-CM | POA: Diagnosis not present

## 2023-03-06 ENCOUNTER — Ambulatory Visit: Payer: Self-pay | Admitting: *Deleted

## 2023-03-06 NOTE — Patient Outreach (Signed)
Care Coordination   Follow Up Visit Note   03/06/2023 Name: Alexis Lloyd MRN: [id number] DOB: Jan 26, 1961  Alexis Lloyd is a 62 y.o. year old female who sees Jacky Kindle, FNP for primary care. I spoke with  [name] by phone today.  What matters to the patients health and wellness today?  Report she is happy to keep decreasing weight, declines complications with Ozempic.     Goals Addressed             This Visit's Progress    RNCM: Effective Management of DM   On track    Care Coordination Interventions:   Provided education to patient about basic DM disease process Reviewed medications with patient and discussed importance of medication adherence Counseled on importance of regular laboratory monitoring as prescribed.   Discussed plans with patient for ongoing care management follow up and provided patient with direct contact information for care management team Provided patient with written educational materials related to hypo and hyperglycemia and importance of correct treatment.   Advised patient, providing education and rationale, to check cbg BID  and record, calling pcp or endocrinologist  for findings outside established parameters. Education on the goal of fasting of <130 and post prandial of <180. Education and support given Review of patient status, including review of consultants reports, relevant laboratory and other test results, and medications completed.  The patient agrees to the care coordination program and working with the [facility] for help in managing DM and other chronic conditions. The patient knows how to reach the [facility] and knows the [facility] will reach out on a regular basis.  Reviewed decrease of A1C, discussed plan to continue decreasing (diet change, exercise, increasing water intake).          SDOH assessments and interventions completed:  No     Care Coordination Interventions:  Yes, provided   Interventions Today    Flowsheet Row  Most Recent Value  Chronic Disease   Chronic disease during today's visit Diabetes, Other  [weight loss]  General Interventions   General Interventions Discussed/Reviewed General Interventions Reviewed, Doctor Visits, Labs, Health Screening  Labs Hgb A1c every 3 months  Doctor Visits Discussed/Reviewed Doctor Visits Reviewed, PCP  [PCP on 9/30]  Health Screening Mammogram  [mammogram completed]  PCP/Specialist Visits Compliance with follow-up visit  Exercise Interventions   Exercise Discussed/Reviewed Weight Managment  Weight Management Weight loss  Education Interventions   Education Provided Provided Education  Provided Verbal Education On Blood Sugar Monitoring, When to see the doctor, Medication  [Still taking Ozempic, weight decreasing.]       Follow up plan: Follow up call scheduled for 10/30    Encounter Outcome:  Patient Visit Completed   Kemper Durie, RN, MSN, [facility] Care Management Care Management Coordinator [phone]

## 2023-03-09 ENCOUNTER — Encounter: Payer: Self-pay | Admitting: Family Medicine

## 2023-03-09 ENCOUNTER — Other Ambulatory Visit (HOSPITAL_COMMUNITY)
Admission: RE | Admit: 2023-03-09 | Discharge: 2023-03-09 | Disposition: A | Discharge status: Home / Self Care | Payer: Medicare HMO | Source: Ambulatory Visit | Attending: Family Medicine | Admitting: Family Medicine

## 2023-03-09 ENCOUNTER — Ambulatory Visit (INDEPENDENT_AMBULATORY_CARE_PROVIDER_SITE_OTHER): Payer: Medicare HMO | Admitting: Family Medicine

## 2023-03-09 VITALS — BP 128/90 | Temp 98.8°F | Resp 16 | Ht <= 58 in | Wt 216.0 lb

## 2023-03-09 DIAGNOSIS — E1159 Type 2 diabetes mellitus with other circulatory complications: Secondary | ICD-10-CM

## 2023-03-09 DIAGNOSIS — E1169 Type 2 diabetes mellitus with other specified complication: Secondary | ICD-10-CM

## 2023-03-09 DIAGNOSIS — E785 Hyperlipidemia, unspecified: Secondary | ICD-10-CM

## 2023-03-09 DIAGNOSIS — N760 Acute vaginitis: Secondary | ICD-10-CM | POA: Diagnosis not present

## 2023-03-09 DIAGNOSIS — Z113 Encounter for screening for infections with a predominantly sexual mode of transmission: Secondary | ICD-10-CM | POA: Diagnosis not present

## 2023-03-09 DIAGNOSIS — N949 Unspecified condition associated with female genital organs and menstrual cycle: Secondary | ICD-10-CM | POA: Insufficient documentation

## 2023-03-09 DIAGNOSIS — E1143 Type 2 diabetes mellitus with diabetic autonomic (poly)neuropathy: Secondary | ICD-10-CM

## 2023-03-09 DIAGNOSIS — N898 Other specified noninflammatory disorders of vagina: Secondary | ICD-10-CM | POA: Insufficient documentation

## 2023-03-09 DIAGNOSIS — E1165 Type 2 diabetes mellitus with hyperglycemia: Secondary | ICD-10-CM | POA: Diagnosis not present

## 2023-03-09 DIAGNOSIS — I152 Hypertension secondary to endocrine disorders: Secondary | ICD-10-CM

## 2023-03-09 DIAGNOSIS — Z9189 Other specified personal risk factors, not elsewhere classified: Secondary | ICD-10-CM | POA: Insufficient documentation

## 2023-03-09 DIAGNOSIS — Z794 Long term (current) use of insulin: Secondary | ICD-10-CM

## 2023-03-09 DIAGNOSIS — B3731 Acute candidiasis of vulva and vagina: Secondary | ICD-10-CM | POA: Diagnosis not present

## 2023-03-09 DIAGNOSIS — B9689 Other specified bacterial agents as the cause of diseases classified elsewhere: Secondary | ICD-10-CM | POA: Insufficient documentation

## 2023-03-09 MED ORDER — VALACYCLOVIR HCL 1 G PO TABS: 1000.0000 mg | ORAL_TABLET | Freq: Two times a day (BID) | ORAL | 0 refills | Status: AC

## 2023-03-09 NOTE — Assessment & Plan Note (Signed)
Acute, worsening Large 1 cm round lesion to L lower labial; viral swab collected Recommend STI screening given known infidelity from spouse Recommend trial of antiviral to assist with supportive cream to assist with burning from urinary exposure ex zinc based cream
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
## 2023-03-09 NOTE — Assessment & Plan Note (Signed)
Chronic, previously elevated LDL goal <70 Repeat LP Continues on crestor 10

## 2023-03-09 NOTE — Assessment & Plan Note (Signed)
Chronic, remains uncontrolled Now on 1 mg of ozempic weekly with farxiga 10 and glipizide 10 mg BID F/b endo

## 2023-03-09 NOTE — Assessment & Plan Note (Signed)
Chronic, stable Continue to recommend balanced, lower carb meals. Smaller meal size, adding snacks. Choosing water as drink of choice and increasing purposeful exercise.  
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
## 2023-03-09 NOTE — Assessment & Plan Note (Signed)
Chronic, borderline elevated Goal of 129/79 Repeat cbc, cmp, tsh Previously noted on  Asa 81, farxiga 10, Zestoretic 20-12.5

## 2023-03-10 LAB — CBC WITH DIFFERENTIAL/PLATELET
Basophils Absolute: 0.1 10*3/uL (ref 0.0–0.2)
Basos: 1 %
EOS (ABSOLUTE): 0.1 10*3/uL (ref 0.0–0.4)
Eos: 1 %
Hematocrit: 38.9 % (ref 34.0–46.6)
Hemoglobin: 12.4 g/dL (ref 11.1–15.9)
Immature Grans (Abs): 0 10*3/uL (ref 0.0–0.1)
Immature Granulocytes: 0 %
Lymphocytes Absolute: 2.8 10*3/uL (ref 0.7–3.1)
Lymphs: 31 %
MCH: 27.7 pg (ref 26.6–33.0)
MCHC: 31.9 g/dL (ref 31.5–35.7)
MCV: 87 fL (ref 79–97)
Monocytes Absolute: 0.6 10*3/uL (ref 0.1–0.9)
Monocytes: 6 %
Neutrophils Absolute: 5.4 10*3/uL (ref 1.4–7.0)
Neutrophils: 61 %
Platelets: 310 10*3/uL (ref 150–450)
RBC: 4.48 x10E6/uL (ref 3.77–5.28)
RDW: 13.7 % (ref 11.7–15.4)
WBC: 9 10*3/uL (ref 3.4–10.8)

## 2023-03-10 LAB — LIPID PANEL
Chol/HDL Ratio: 5.5 {ratio} — ABNORMAL HIGH (ref 0.0–4.4)
Cholesterol, Total: 153 mg/dL (ref 100–199)
HDL: 28 mg/dL — ABNORMAL LOW (ref >39)
LDL Chol Calc (NIH): 80 mg/dL (ref 0–99)
Triglycerides: 269 mg/dL — ABNORMAL HIGH (ref 0–149)
VLDL Cholesterol Cal: 45 mg/dL — ABNORMAL HIGH (ref 5–40)

## 2023-03-10 LAB — COMPREHENSIVE METABOLIC PANEL
ALT: 18 [IU]/L (ref 0–32)
AST: 11 [IU]/L (ref 0–40)
Albumin: 4.2 g/dL (ref 3.9–4.9)
Alkaline Phosphatase: 93 [IU]/L (ref 44–121)
BUN/Creatinine Ratio: 18 (ref 12–28)
BUN: 19 mg/dL (ref 8–27)
Bilirubin Total: 0.2 mg/dL (ref 0.0–1.2)
CO2: 23 mmol/L (ref 20–29)
Calcium: 10.2 mg/dL (ref 8.7–10.3)
Chloride: 100 mmol/L (ref 96–106)
Creatinine, Ser: 1.05 mg/dL — ABNORMAL HIGH (ref 0.57–1.00)
Globulin, Total: 3 g/dL (ref 1.5–4.5)
Glucose: 135 mg/dL — ABNORMAL HIGH (ref 70–99)
Potassium: 4 mmol/L (ref 3.5–5.2)
Sodium: 140 mmol/L (ref 134–144)
Total Protein: 7.2 g/dL (ref 6.0–8.5)
eGFR: 59 mL/min/{1.73_m2} — ABNORMAL LOW (ref >59)

## 2023-03-10 LAB — HEPATITIS B CORE AB W/REFLEX: Hep B Core Total Ab: NEGATIVE

## 2023-03-10 LAB — HIV ANTIBODY (ROUTINE TESTING W REFLEX): HIV Screen 4th Generation wRfx: NONREACTIVE

## 2023-03-10 LAB — MICROALBUMIN / CREATININE URINE RATIO
Creatinine, Urine: 70.5 mg/dL
Microalb/Creat Ratio: 11 mg/g{creat} (ref 0–29)
Microalbumin, Urine: 7.7 ug/mL

## 2023-03-10 LAB — RPR: RPR Ser Ql: NONREACTIVE

## 2023-03-10 LAB — TSH: TSH: 1.63 u[IU]/mL (ref 0.450–4.500)

## 2023-03-10 LAB — HEPATITIS C ANTIBODY: Hep C Virus Ab: NONREACTIVE

## 2023-03-10 NOTE — Progress Notes (Signed)
Borderline elevation in creatinine; similar from labs 1 year ago. Continue to monitor blood pressure given elevation yesterday. Goal remains <129/<79. Recommend titration to 2 tablets of zestoretic with 1 month follow up for labs and blood pressure check (BMP).  Cholesterol remains elevated; LDL goal of <70. The 10-year ASCVD risk score (Arnett DK, et al., 2019) is: 18.7%. Recommend increase in Crestor to 20 mg (2-10 mg tablets daily). If tolerated, can send in new Rx at higher dose.  Infectious blood work remains negative at this time.

## 2023-03-10 NOTE — Progress Notes (Signed)
Negative STI panel and normal urine micro.

## 2023-03-11 ENCOUNTER — Other Ambulatory Visit: Payer: Self-pay | Admitting: Family Medicine

## 2023-03-11 LAB — CERVICOVAGINAL ANCILLARY ONLY
Bacterial Vaginitis (gardnerella): POSITIVE — AB
Candida Glabrata: POSITIVE — AB
Candida Vaginitis: NEGATIVE
Chlamydia: NEGATIVE
Comment: NEGATIVE
Comment: NEGATIVE
Comment: NEGATIVE
Comment: NEGATIVE
Comment: NEGATIVE
Comment: NORMAL
Neisseria Gonorrhea: NEGATIVE
Trichomonas: NEGATIVE

## 2023-03-11 MED ORDER — FLUCONAZOLE 150 MG PO TABS: ORAL_TABLET | ORAL | 0 refills | Status: DC

## 2023-03-11 MED ORDER — METRONIDAZOLE 500 MG PO TABS: 500.0000 mg | ORAL_TABLET | Freq: Two times a day (BID) | ORAL | 0 refills | Status: AC

## 2023-03-11 NOTE — Progress Notes (Signed)
Start tx for BV and yeast.

## 2023-03-13 ENCOUNTER — Telehealth: Payer: Self-pay

## 2023-03-13 NOTE — Telephone Encounter (Signed)
Pt called, Pt given lab results per notes of Robynn Pane, NP on 03/09/23. Pt verbalized understanding. Scheduled pt for nurse visit on 04/08/23 at 1350 for labs and BP recheck. Pt also will go get new rxs today for BV and yeast infection. No further assistance noted.

## 2023-03-13 NOTE — Telephone Encounter (Signed)
Sent message to patient that its fine to  cancel appt for appt on 03/20/23.

## 2023-03-13 NOTE — Telephone Encounter (Signed)
Copied from CRM [id number]. Topic: Appointment Scheduling - Scheduling Inquiry for Clinic >> Mar 13, 2023 11:09 AM Franchot Heidelberg wrote: Reason for CRM: Pt wants to know if she needs to keep or cancel her upcoming appt since she was just seen last week. Please advise

## 2023-03-17 ENCOUNTER — Telehealth: Payer: Self-pay | Admitting: Family Medicine

## 2023-03-17 LAB — VIRUS CULTURE

## 2023-03-17 NOTE — Telephone Encounter (Signed)
Patient with Josephine Igo 2

## 2023-03-17 NOTE — Telephone Encounter (Signed)
[facility] is requesting prescription refill glucose blood (ONETOUCH VERIO) test strip  Please advise

## 2023-03-18 ENCOUNTER — Other Ambulatory Visit: Payer: Self-pay | Admitting: Family Medicine

## 2023-03-18 DIAGNOSIS — E1165 Type 2 diabetes mellitus with hyperglycemia: Secondary | ICD-10-CM

## 2023-03-18 MED ORDER — DAPAGLIFLOZIN PROPANEDIOL 10 MG PO TABS: 10.0000 mg | ORAL_TABLET | Freq: Every day | ORAL | Status: DC

## 2023-03-18 NOTE — Telephone Encounter (Signed)
VM left. CRM created. Ok for [facility] to advise.  4 boxes available at front desk for her to pick up which will be a 28 day supply.

## 2023-03-18 NOTE — Progress Notes (Signed)
Medication Samples have been provided to the patient.  Drug name: Marcelline Deist       Strength: 10mg         Qty: 4 pkts of 7  LOT: see binder  Exp.Date: see binder  Dosing instructions: Once daily  The patient has been instructed regarding the correct time, dose, and frequency of taking this medication, including desired effects and most common side effects.   Jacky Kindle 1:22 PM 03/18/2023

## 2023-03-18 NOTE — Telephone Encounter (Signed)
Pt is calling to f/u on medication Farxiga. Asked if it's ready to be picked up and, if not, if any samples are available. She stated she is completely out of the medication. Has been out of it for two days.  Please advise.

## 2023-03-20 ENCOUNTER — Ambulatory Visit: Payer: Medicare HMO | Admitting: Family Medicine

## 2023-03-23 ENCOUNTER — Other Ambulatory Visit: Payer: Self-pay | Admitting: Family Medicine

## 2023-03-23 MED ORDER — ONETOUCH DELICA LANCETS 30G MISC: 1.0000 | 11 refills | Status: DC

## 2023-03-24 ENCOUNTER — Other Ambulatory Visit: Payer: Self-pay | Admitting: Family Medicine

## 2023-03-24 DIAGNOSIS — J40 Bronchitis, not specified as acute or chronic: Secondary | ICD-10-CM

## 2023-03-25 NOTE — Telephone Encounter (Signed)
Requested Prescriptions  Pending Prescriptions Disp Refills   montelukast (SINGULAIR) 10 MG tablet [Pharmacy Med Name: Montelukast Sodium 10 MG Oral Tablet] 90 tablet 0    Sig: Take 1 tablet by mouth once daily     Pulmonology:  Leukotriene Inhibitors Passed - 03/24/2023  1:08 PM      Passed - Valid encounter within last 12 months    Recent Outpatient Visits           2 weeks ago Vaginal sore   [facility] Jacky Kindle, FNP   1 month ago No-show for appointment   [facility] Jacky Kindle, FNP   1 month ago No-show for appointment   [facility] Merita Norton T, FNP   9 months ago Acute cystitis without hematuria   [facility] Jacky Kindle, FNP   10 months ago Acute cough   [facility] [place], [place], [place]

## 2023-04-01 DIAGNOSIS — E1165 Type 2 diabetes mellitus with hyperglycemia: Secondary | ICD-10-CM | POA: Diagnosis not present

## 2023-04-08 ENCOUNTER — Ambulatory Visit: Payer: Self-pay | Admitting: *Deleted

## 2023-04-08 ENCOUNTER — Telehealth: Payer: Self-pay

## 2023-04-08 ENCOUNTER — Ambulatory Visit: Payer: Medicare HMO

## 2023-04-08 ENCOUNTER — Ambulatory Visit (INDEPENDENT_AMBULATORY_CARE_PROVIDER_SITE_OTHER): Payer: Medicare HMO | Admitting: Family Medicine

## 2023-04-08 VITALS — BP 115/60 | HR 79 | Temp 97.9°F | Resp 16 | Ht 59.0 in | Wt 214.4 lb

## 2023-04-08 DIAGNOSIS — I152 Hypertension secondary to endocrine disorders: Secondary | ICD-10-CM | POA: Diagnosis not present

## 2023-04-08 DIAGNOSIS — L732 Hidradenitis suppurativa: Secondary | ICD-10-CM | POA: Insufficient documentation

## 2023-04-08 DIAGNOSIS — Z7985 Long-term (current) use of injectable non-insulin antidiabetic drugs: Secondary | ICD-10-CM | POA: Diagnosis not present

## 2023-04-08 DIAGNOSIS — E1159 Type 2 diabetes mellitus with other circulatory complications: Secondary | ICD-10-CM

## 2023-04-08 MED ORDER — TIRZEPATIDE 2.5 MG/0.5ML ~~LOC~~ SOAJ: 2.5000 mg | SUBCUTANEOUS | 0 refills | Status: DC

## 2023-04-08 MED ORDER — TIRZEPATIDE 7.5 MG/0.5ML ~~LOC~~ SOAJ: 7.5000 mg | SUBCUTANEOUS | 0 refills | Status: DC

## 2023-04-08 MED ORDER — DOXYCYCLINE HYCLATE 100 MG PO TABS: 100.0000 mg | ORAL_TABLET | Freq: Two times a day (BID) | ORAL | 0 refills | Status: DC

## 2023-04-08 NOTE — Progress Notes (Signed)
Established patient visit   Patient: Alexis Lloyd   DOB: 1960-10-23   62 y.o. Female  MRN: [id number] Visit Date: 04/08/2023  Today's healthcare provider: Jacky Kindle, FNP  Introduced to nurse practitioner role and practice setting.  All questions answered.  Discussed provider/patient relationship and expectations.  Subjective    HPI HPI     Follow-up    Additional comments: 1 mo f/u blood pressure & labs recheck Currently a boil on right leg since Monday       Last edited by Clois Comber on 04/08/2023  1:28 PM.      Medications: Outpatient Medications Prior to Visit  Medication Sig   albuterol (VENTOLIN HFA) 108 (90 Base) MCG/ACT inhaler Inhale 2 puffs into the lungs every 6 (six) hours as needed.   aspirin EC 81 MG tablet Take 1 tablet (81 mg total) by mouth daily. Swallow whole.   Blood Glucose Monitoring Suppl (ONETOUCH VERIO) w/Device KIT To check blood sugar once daily   conjugated estrogens (PREMARIN) vaginal cream Place 1 Applicatorful vaginally daily.   Continuous Glucose Receiver (FREESTYLE LIBRE 2 READER) DEVI 1 each by Does not apply route as directed.   Continuous Glucose Sensor (FREESTYLE LIBRE 2 SENSOR) MISC 1 each by Does not apply route every 14 (fourteen) days.   dapagliflozin propanediol (FARXIGA) 10 MG TABS tablet Take 1 tablet (10 mg total) by mouth daily before breakfast. Patient receives via [employer]ME patient assistance   dapagliflozin propanediol (FARXIGA) 10 MG TABS tablet Take 1 tablet (10 mg total) by mouth daily before breakfast.   glucose blood (ONETOUCH VERIO) test strip To check blood sugar once daily   ibuprofen (ADVIL) 200 MG tablet Take 400 mg by mouth every 6 (six) hours as needed.   insulin degludec (TRESIBA FLEXTOUCH) 100 UNIT/ML FlexTouch Pen Inject 24 Units into the skin at bedtime.   Insulin Pen Needle (PEN NEEDLES) 32G X 6 MM MISC 1 each by Does not apply route at bedtime.   lisinopril-hydrochlorothiazide (ZESTORETIC) 20-12.5 MG  tablet Take 1 tablet by mouth daily.   loperamide (IMODIUM) 2 MG capsule Take 2 capsules (4 mg total) by mouth 4 (four) times daily as needed for diarrhea or loose stools.   montelukast (SINGULAIR) 10 MG tablet Take 1 tablet by mouth once daily   Multiple Vitamins-Minerals (PRESERVISION AREDS 2) CAPS Take 1 capsule by mouth daily.   OneTouch Delica Lancets 30G MISC 1 each by Does not apply route as directed.   pantoprazole (PROTONIX) 40 MG tablet Take 1 tablet (40 mg total) by mouth daily.   rosuvastatin (CRESTOR) 10 MG tablet Take 1 tablet by mouth once daily   scopolamine (TRANSDERM-SCOP) 1 MG/3DAYS Place 1 patch (1.5 mg total) onto the skin every 3 (three) days.   spironolactone (ALDACTONE) 25 MG tablet Take 1 tablet (25 mg total) by mouth daily.   SUMAtriptan (IMITREX) 50 MG tablet Take 1 tablet (50 mg total) by mouth every 2 (two) hours as needed for migraine. No more than 200mg  in 24 hours   [DISCONTINUED] Semaglutide,0.25 or 0.5MG /DOS, (OZEMPIC, 0.25 OR 0.5 MG/DOSE,) 2 MG/1.5ML SOPN Inject 0.25 mg into the skin weekly for four weeks, the increase to 0.5 mg weekly.   fluconazole (DIFLUCAN) 150 MG tablet Take 1 tablet PO for vaginal yeast; repeat dose in 4 days for complete treatment. (Patient not taking: Reported on 04/08/2023)   glipiZIDE (GLUCOTROL) 10 MG tablet Take 1 tablet (10 mg total) by mouth 2 (two) times daily before a  meal.   No facility-administered medications prior to visit.     Objective    BP 115/60 (BP Location: Right Arm, Patient Position: Sitting, Cuff Size: Normal)   Pulse 79   Temp 97.9 F (36.6 C)   Resp 16   Ht 4\' 11"  (1.499 m)   Wt 214 lb 6.4 oz (97.3 kg)   SpO2 95%   BMI 43.30 kg/m   Physical Exam Vitals and nursing note reviewed.  Constitutional:      General: She is not in acute distress.    Appearance: Normal appearance. She is obese. She is not ill-appearing, toxic-appearing or diaphoretic.  HENT:     Head: Normocephalic and atraumatic.   Cardiovascular:     Rate and Rhythm: Normal rate and regular rhythm.     Pulses: Normal pulses.     Heart sounds: Normal heart sounds. No murmur heard.    No friction rub. No gallop.  Pulmonary:     Effort: Pulmonary effort is normal. No respiratory distress.     Breath sounds: Normal breath sounds. No stridor. No wheezing, rhonchi or rales.  Chest:     Chest wall: No tenderness.  Musculoskeletal:        General: No swelling, tenderness, deformity or signs of injury. Normal range of motion.     Right lower leg: No edema.     Left lower leg: No edema.  Skin:    General: Skin is warm and dry.     Capillary Refill: Capillary refill takes less than 2 seconds.     Coloration: Skin is not jaundiced or pale.     Findings: No bruising, erythema, lesion or rash.  Neurological:     General: No focal deficit present.     Mental Status: She is alert and oriented to person, place, and time. Mental status is at baseline.     Cranial Nerves: No cranial nerve deficit.     Sensory: No sensory deficit.     Motor: No weakness.     Coordination: Coordination normal.  Psychiatric:        Mood and Affect: Mood normal.        Behavior: Behavior normal.        Thought Content: Thought content normal.        Judgment: Judgment normal.     No results found for any visits on 04/08/23.  Assessment & Plan     Problem List Items Addressed This Visit       Cardiovascular and Mediastinum   Hypertension associated with diabetes (HCC)    Chronic, improved Recommend change from ozempic to mounjaro to assist with weight mgmt pt plans to increase from 0.5 mg to 1 mg with recent order; will start mounjaro at 7.5      Relevant Medications   tirzepatide (MOUNJARO) 7.5 MG/0.5ML Pen     Musculoskeletal and Integument   Vulval hidradenitis suppurativa - Primary    Recommend ABX treatment; continue to monitor for complete healing        Other   Morbid obesity (HCC)    Chronic; improving slowly Body  mass index is 43.3 kg/m. Discussed importance of healthy weight management Discussed diet and exercise       Relevant Medications   tirzepatide (MOUNJARO) 7.5 MG/0.5ML Pen   No follow-ups on file.     Leilani Merl, FNP, have reviewed all documentation for this visit. The documentation on 04/08/23 for the exam, diagnosis, procedures, and orders are all accurate and  complete.  Jacky Kindle, FNP  [facility] [phone] (phone) [phone] (fax)  [facility]

## 2023-04-08 NOTE — Patient Outreach (Signed)
Care Coordination   Follow Up Visit Note   04/08/2023 Name: Alexis Lloyd MRN: [id number] DOB: [birth year]  Alexis Lloyd is a 62 y.o. year old female who sees Jacky Kindle, FNP for primary care. I spoke with  [name] by phone today.  What matters to the patients health and wellness today?  Patient's main goals are to decrease A1C and weight.  Denies any urgent concerns, encouraged to contact this care manager with questions.      Goals Addressed             This Visit's Progress    RNCM: Effective Management of DM   On track    Care Coordination Interventions:   Provided education to patient about basic DM disease process Reviewed medications with patient and discussed importance of medication adherence Counseled on importance of regular laboratory monitoring as prescribed.   Discussed plans with patient for ongoing care management follow up and provided patient with direct contact information for care management team Provided patient with written educational materials related to hypo and hyperglycemia and importance of correct treatment.   Advised patient, providing education and rationale, to check cbg BID  and record, calling pcp or endocrinologist  for findings outside established parameters. Education on the goal of fasting of <130 and post prandial of <180. Education and support given Review of patient status, including review of consultants reports, relevant laboratory and other test results, and medications completed.  The patient agrees to the care coordination program and working with the [facility] for help in managing DM and other chronic conditions. The patient knows how to reach the [facility] and knows the [facility] will reach out on a regular basis.  Reviewed decrease of A1C, discussed plan to continue decreasing (diet change, exercise, increasing water intake).          SDOH assessments and interventions completed:  No     Care Coordination Interventions:  Yes,  provided   Interventions Today    Flowsheet Row Most Recent Value  Chronic Disease   Chronic disease during today's visit Diabetes, Other  [weight loss]  General Interventions   General Interventions Discussed/Reviewed General Interventions Reviewed, Doctor Visits, Vaccines, Annual Eye Exam, Labs, Annual Foot Exam  Labs Hgb A1c every 3 months  [A1C 8.6, still working to improve]  Vaccines Shingles, Flu  [will get shingles shot, does not want flu shot]  Doctor Visits Discussed/Reviewed Doctor Visits Reviewed, PCP, Specialist  [GI on 1/13 and Endocrine 1/23]  PCP/Specialist Visits Compliance with follow-up visit  [PCP today for BP check, reading normal]  Exercise Interventions   Exercise Discussed/Reviewed Physical Activity, Weight Managment  Physical Activity Discussed/Reviewed Physical Activity Reviewed  [Encouarged to increas activity]  Weight Management Weight loss  [down another 2 pounds]  Education Interventions   Education Provided Provided Education, Provided Web-based Education  Provided Verbal Education On Nutrition, Labs, Blood Sugar Monitoring, When to see the doctor, Medication  [Currently taking Ozempic, will change to [facility] once Ozempic has been taken all up]  Labs Reviewed Hgb A1c  Nutrition Interventions   Nutrition Discussed/Reviewed Carbohydrate meal planning, Nutrition Reviewed, Adding fruits and vegetables, Decreasing sugar intake, Decreasing fats, Portion sizes  Pharmacy Interventions   Pharmacy Dicussed/Reviewed Affording Medications, Pharmacy Topics Reviewed  [Currently getting medication assistance for Ozempic, she will speak with provider about cost of Mounjaro]       Follow up plan: Follow up call scheduled for 11/26 w/ FConstance Haw, RNCM    Encounter Outcome:  Patient Visit Completed   Kemper Durie RN, MSN, CCM [place]  [facility] RN Care Coordinator Direct Dial: [phone] / Main [phone] Fax  [phone] Email: [email or web address]: [email or web address]

## 2023-04-08 NOTE — Patient Outreach (Signed)
Care Coordination   04/08/2023 Name: Alexis Lloyd MRN: [id number] DOB: 1960/08/23   Care Coordination Outreach Attempts:  An unsuccessful telephone outreach was attempted for a scheduled appointment today.  Follow Up Plan:  Additional outreach attempts will be made to offer the patient care coordination information and services.   Encounter Outcome:  No Answer   Care Coordination Interventions:  No, not indicated    Kemper Durie RN, MSN, CCM [facility], [facility] RN Care Coordinator Direct Dial: [phone] / Main [phone] Fax [phone] Email: [email or web address]: [email or web address]

## 2023-04-08 NOTE — Assessment & Plan Note (Signed)
Recommend ABX treatment; continue to monitor for complete healing

## 2023-04-08 NOTE — Assessment & Plan Note (Signed)
Chronic; improving slowly Body mass index is 43.3 kg/m. Discussed importance of healthy weight management Discussed diet and exercise

## 2023-04-08 NOTE — Telephone Encounter (Signed)
Copied from CRM [id number]. Topic: General - Other >> Apr 08, 2023 11:48 AM Macon Large wrote: Reason for CRM: Pt stated that she was returning a missed call that she had from the office.

## 2023-04-08 NOTE — Assessment & Plan Note (Signed)
Chronic, improved Recommend change from ozempic to mounjaro to assist with weight mgmt pt plans to increase from 0.5 mg to 1 mg with recent order; will start mounjaro at 7.5

## 2023-04-08 NOTE — Telephone Encounter (Signed)
Patient was seen bu provider today. It was an appointment reminder

## 2023-04-20 ENCOUNTER — Other Ambulatory Visit: Payer: Self-pay

## 2023-04-20 ENCOUNTER — Other Ambulatory Visit: Payer: Self-pay | Admitting: Family Medicine

## 2023-04-20 DIAGNOSIS — E1129 Type 2 diabetes mellitus with other diabetic kidney complication: Secondary | ICD-10-CM

## 2023-04-20 MED ORDER — DAPAGLIFLOZIN PROPANEDIOL 10 MG PO TABS: 10.0000 mg | ORAL_TABLET | Freq: Every day | ORAL | 1 refills | Status: DC

## 2023-04-20 NOTE — Telephone Encounter (Signed)
Patient called to see if she could get samples of dapagliflozin propanediol (FARXIGA) as the copay was $100 and she says she can't afford that

## 2023-04-20 NOTE — Telephone Encounter (Signed)
Patient request samples of  Requested Prescriptions   Pending Prescriptions Disp Refills   dapagliflozin propanediol (FARXIGA) 10 MG TABS tablet      Sig: Take 1 tablet (10 mg total) by mouth daily before breakfast.   Please advise

## 2023-04-23 ENCOUNTER — Telehealth: Payer: Self-pay | Admitting: Family Medicine

## 2023-04-23 NOTE — Telephone Encounter (Signed)
Pt is calling in because the copay for dapagliflozin propanediol (FARXIGA) 10 MG TABS tablet [[id number]] is now $300 and pt cannot afford that. Pt is requesting some samples of the medication as she has been given samples before. Pt is requesting someone call her back

## 2023-04-23 NOTE — Telephone Encounter (Signed)
Please advise 

## 2023-04-28 NOTE — Telephone Encounter (Signed)
Patient called requesting samples again. Patient states she is completely out of medication and $300 is too expensive.   Please follow up with patient.  Best call back number: [phone]

## 2023-04-29 ENCOUNTER — Other Ambulatory Visit: Payer: Self-pay | Admitting: Family Medicine

## 2023-04-29 MED ORDER — EMPAGLIFLOZIN 25 MG PO TABS: 25.0000 mg | ORAL_TABLET | Freq: Every day | ORAL | 4 refills | Status: DC

## 2023-05-05 ENCOUNTER — Telehealth: Payer: Self-pay | Admitting: Pharmacist

## 2023-05-05 ENCOUNTER — Other Ambulatory Visit: Payer: Self-pay

## 2023-05-05 DIAGNOSIS — E1165 Type 2 diabetes mellitus with hyperglycemia: Secondary | ICD-10-CM

## 2023-05-05 MED ORDER — EMPAGLIFLOZIN 25 MG PO TABS: 25.0000 mg | ORAL_TABLET | Freq: Every day | ORAL | 4 refills | Status: DC

## 2023-05-05 NOTE — Progress Notes (Unsigned)
   05/05/2023  Patient ID: Alexis Lloyd, female   DOB: Jan 30, 1961, 62 y.o.   MRN: [id number]  Reviewed status of Jardiance recommendation. Appears prescription was sent to [facility] instead of [facility]. Will call [facility] later today to check on status of Jardiance and see if a PA is needed.    Marlowe Aschoff, PharmD [facility] Phone Number: [phone]

## 2023-05-06 ENCOUNTER — Ambulatory Visit: Payer: Self-pay

## 2023-05-06 ENCOUNTER — Telehealth: Payer: Self-pay | Admitting: Pharmacist

## 2023-05-06 ENCOUNTER — Telehealth: Payer: Self-pay

## 2023-05-06 ENCOUNTER — Other Ambulatory Visit: Payer: Self-pay | Admitting: Family Medicine

## 2023-05-06 MED ORDER — FLUCONAZOLE 150 MG PO TABS: ORAL_TABLET | ORAL | 0 refills | Status: DC

## 2023-05-06 MED ORDER — HYDROCORTISONE 1 % EX CREA: 1.0000 | TOPICAL_CREAM | Freq: Two times a day (BID) | CUTANEOUS | 0 refills | Status: DC

## 2023-05-06 NOTE — Telephone Encounter (Signed)
  Chief Complaint: multiple medication questions and clarifications needed. Symptoms: yeast infection reported after completing doxycyline course.  Frequency: na  Pertinent Negatives: Patient denies na  Disposition: [] ED /[] Urgent Care (no appt availability in office) / [] Appointment(In office/virtual)/ []  [place] [facility]/ [] Home Care/ [] Refused Recommended Disposition /[] [place] [facility]/ [x]  Follow-up with PCP Additional Notes:   Please advise . Patient requesting Rx for diflucan due to yeast sx after completing doxycyline course. Patient requesting if another alternative cream or pill can be given due to cost of premarin vaginal cream. Needs clarification regariding which medication to be on; farxiga or jardiance? Reports she does have samples from office for farxiga left. Do you want patient to stop farxiga and start jardiance? London Pepper is no copay. Last orders from 04/29/23 to end jardiance 05/05/23. Patient will call endocrinologist regarding if she is to start [facility] , she is taking her last shot of ozempic and if ozempic does not come in by Sunday , do you want her to start mounjaro?      Summary: needing medication clarity   Patient called and states she had a virtual visit with Pharmacist yesterday, who told her she needed to go on Jardiance but did not explain why and patient has not heard from Merita Norton PCP why she needed to be put on Jardiance.  Patient states the medication is already at the pharmacy but also states the pharmacist mentioned something about cream and she states she just needs medication clarity on what she is supposed to take and not to take.  Please contact patient back at phone # [phone]          Reason for Disposition  [1] Caller has URGENT medicine question about med that PCP or specialist prescribed AND [2] triager unable to answer question  Answer Assessment - Initial Assessment Questions 1. NAME of MEDICINE: "What medicine(s)  are you calling about?"     Multiple medications. Clarification regarding farxiga and jardiance, premarin vaginal cream, mounjaro , requesting diflucan  2. QUESTION: "What is your question?" (e.g., double dose of medicine, side effect)     Need to clarify if she is to take farxiga or jardiance?  Can she have alternative cream to premarin due to cost? Requesting Rx for diflucan due to yeast sx after completing doxycyline course.  3. PRESCRIBER: "Who prescribed the medicine?" Reason: if prescribed by specialist, call should be referred to that group.     PCP 4. SYMPTOMS: "Do you have any symptoms?" If Yes, ask: "What symptoms are you having?"  "How bad are the symptoms (e.g., mild, moderate, severe)     Yes yeast sx 5. PREGNANCY:  "Is there any chance that you are pregnant?" "When was your last menstrual period?"     na  Protocols used: Medication Question Call-A-AH

## 2023-05-06 NOTE — Progress Notes (Addendum)
   05/06/2023  Patient ID: Alexis Lloyd, female   DOB: [birth year], 62 y.o.   MRN: [id number]  Called the patient to let her know that the [place] Pepper was ready at the pharmacy for $0. Proceeds to say she just got off the phone with another nurse asking about the [place] together. York Spaniel she got 63 DS of Farxiga from the office and doesn't know which to take. Also had questions regarding the Mounjaro and Ozempic.  Without getting to provide any suggestions, she said she had her "sugar doctor on the other phone" and would call me back. Unable to proceed further in discussion.   If she calls back, I would say she can finish up the [place], but may want to switch to Jardiance in the future as it is a $0 copay and preferred by her insurance. Can then finish up the Ozempic and switch to [facility] if covered well. Would not recommend taking [place] + Jardiance or Mounjaro + Ozempic- both are one or the other!!  Of note, she spoke with an RN yesterday- NOT a pharmacist.   Marlowe Aschoff, PharmD [facility] Phone Number: [phone]

## 2023-05-06 NOTE — Telephone Encounter (Signed)
Patient called and advised of the message from [place]. She says that she will need the diflucan and steroid cream to go to [facility] on [address]. She says that she picked up a 90 day supply of Farxiga from the office and realized London Pepper is at the pharmacy. She says she will be taking her last shot of Ozempic on Sunday and the med assist program says it will be 7-10 days before receiving the next dose. She says she called her endocrinologist after speaking to Raven this morning and he told her not to take [place] and Mounjaro until she's seen by him in January. She's asking what does she need to do. Advised per Robynn Pane and Raven to take the farxiga until it's gone then switch to [place]. Advised to call the endocrinologist and let him know that she has the farxiga and was advised by Robynn Pane to take until all gone, which should last until seen in January and let him tell her what to do regarding this. She says she will call.

## 2023-05-06 NOTE — Telephone Encounter (Signed)
Rx sent 

## 2023-05-06 NOTE — Telephone Encounter (Signed)
Copied from CRM [id number]. Topic: Appointment Scheduling - Scheduling Inquiry for Clinic >> May 06, 2023  3:49 PM Payton Doughty wrote: Reason for CRM: pt would like Robynn Pane to call in the diflucan and steroid cream asap.  There is a NT note

## 2023-05-06 NOTE — Telephone Encounter (Signed)
ummary: needing medication clarity   Patient called and states she had a virtual visit with Pharmacist yesterday, who told her she needed to go on Jardiance but did not explain why and patient has not heard from Merita Norton PCP why she needed to be put on Jardiance.  Patient states the medication is already at the pharmacy but also states the pharmacist mentioned something about cream and she states she just needs medication clarity on what she is supposed to take and not to take.      Left message to call back.

## 2023-05-08 NOTE — Patient Outreach (Signed)
  Care Management   Visit Note   Name: Alexis Lloyd MRN: [id number] DOB: 09-13-1960  Subjective: Alexis Lloyd is a 62 y.o. year old female who is a primary care patient of Alexis Kindle, FNP. The Care Management team was consulted for assistance.      Engaged with [name] via telephone.  Assessment:  Outpatient Encounter Medications as of 05/05/2023  Medication Sig   albuterol (VENTOLIN HFA) 108 (90 Base) MCG/ACT inhaler Inhale 2 puffs into the lungs every 6 (six) hours as needed.   aspirin EC 81 MG tablet Take 1 tablet (81 mg total) by mouth daily. Swallow whole.   glipiZIDE (GLUCOTROL) 10 MG tablet Take 1 tablet (10 mg total) by mouth 2 (two) times daily before a meal.   ibuprofen (ADVIL) 200 MG tablet Take 400 mg by mouth every 6 (six) hours as needed.   insulin degludec (TRESIBA FLEXTOUCH) 100 UNIT/ML FlexTouch Pen Inject 24 Units into the skin at bedtime.   lisinopril-hydrochlorothiazide (ZESTORETIC) 20-12.5 MG tablet Take 1 tablet by mouth daily.   montelukast (SINGULAIR) 10 MG tablet Take 1 tablet by mouth once daily   pantoprazole (PROTONIX) 40 MG tablet Take 1 tablet (40 mg total) by mouth daily.   rosuvastatin (CRESTOR) 10 MG tablet Take 1 tablet by mouth once daily   spironolactone (ALDACTONE) 25 MG tablet Take 1 tablet (25 mg total) by mouth daily.   Blood Glucose Monitoring Suppl (ONETOUCH VERIO) w/Device KIT To check blood sugar once daily   conjugated estrogens (PREMARIN) vaginal cream Place 1 Applicatorful vaginally daily. (Patient not taking: Reported on 05/05/2023)   Continuous Glucose Receiver (FREESTYLE LIBRE 2 READER) DEVI 1 each by Does not apply route as directed.   Continuous Glucose Sensor (FREESTYLE LIBRE 2 SENSOR) MISC 1 each by Does not apply route every 14 (fourteen) days.   doxycycline (VIBRA-TABS) 100 MG tablet Take 1 tablet (100 mg total) by mouth 2 (two) times daily. (Patient not taking: Reported on 05/05/2023)   empagliflozin (JARDIANCE) 25 MG  TABS tablet Take 1 tablet (25 mg total) by mouth daily before breakfast.   glucose blood (ONETOUCH VERIO) test strip To check blood sugar once daily   Insulin Pen Needle (PEN NEEDLES) 32G X 6 MM MISC 1 each by Does not apply route at bedtime.   loperamide (IMODIUM) 2 MG capsule Take 2 capsules (4 mg total) by mouth 4 (four) times daily as needed for diarrhea or loose stools. (Patient not taking: Reported on 05/05/2023)   Multiple Vitamins-Minerals (PRESERVISION AREDS 2) CAPS Take 1 capsule by mouth daily. (Patient not taking: Reported on 05/05/2023)   OneTouch Delica Lancets 30G MISC 1 each by Does not apply route as directed.   scopolamine (TRANSDERM-SCOP) 1 MG/3DAYS Place 1 patch (1.5 mg total) onto the skin every 3 (three) days. (Patient not taking: Reported on 05/05/2023)   SUMAtriptan (IMITREX) 50 MG tablet Take 1 tablet (50 mg total) by mouth every 2 (two) hours as needed for migraine. No more than 200mg  in 24 hours (Patient not taking: Reported on 05/05/2023)   tirzepatide (MOUNJARO) 7.5 MG/0.5ML Pen Inject 7.5 mg into the skin once a week.   [DISCONTINUED] fluconazole (DIFLUCAN) 150 MG tablet Take 1 tablet PO for vaginal yeast; repeat dose in 4 days for complete treatment. (Patient not taking: Reported on 04/08/2023)   No facility-administered encounter medications on file as of 05/05/2023.

## 2023-05-18 ENCOUNTER — Ambulatory Visit: Payer: Medicare HMO | Admitting: Physician Assistant

## 2023-05-18 VITALS — BP 130/75 | HR 78 | Temp 98.3°F | Resp 16 | Ht <= 58 in | Wt 215.0 lb

## 2023-05-18 DIAGNOSIS — R350 Frequency of micturition: Secondary | ICD-10-CM | POA: Diagnosis not present

## 2023-05-18 DIAGNOSIS — L732 Hidradenitis suppurativa: Secondary | ICD-10-CM | POA: Diagnosis not present

## 2023-05-18 DIAGNOSIS — E1165 Type 2 diabetes mellitus with hyperglycemia: Secondary | ICD-10-CM

## 2023-05-18 LAB — POCT URINALYSIS DIPSTICK
Bilirubin, UA: NEGATIVE
Blood, UA: NEGATIVE
Glucose, UA: POSITIVE — AB
Ketones, UA: NEGATIVE
Leukocytes, UA: NEGATIVE
Nitrite, UA: NEGATIVE
Protein, UA: NEGATIVE
Spec Grav, UA: 1.025 (ref 1.010–1.025)
Urobilinogen, UA: 0.2 U/dL
pH, UA: 6 (ref 5.0–8.0)

## 2023-05-18 LAB — POCT GLUCOSE FINGERSTICK: Glucose: 158 — AB (ref 70–99)

## 2023-05-18 MED ORDER — DOXYCYCLINE HYCLATE 100 MG PO TABS: 100.0000 mg | ORAL_TABLET | Freq: Two times a day (BID) | ORAL | 0 refills | Status: DC

## 2023-05-18 NOTE — Progress Notes (Unsigned)
Established patient visit  Patient: Alexis Lloyd   DOB: 1961-03-07   62 y.o. Female  MRN: [id number] Visit Date: 05/18/2023  Today's healthcare provider: Debera Lat, PA-C   Chief Complaint  Patient presents with   Urinary Tract Infection    Patient has been having increased frequency of urination and odor to her urine.  No pain or hematuria.  No vaginal discharge.   Subjective     Discussed the use of AI scribe software for clinical note transcription with the patient, who gave verbal consent to proceed.  History of Present Illness         The patient, with a known history of diabetes, presents with multiple complaints. The chief complaint is a growth on their bottom, which has been previously treated with antibiotics. The patient reports that the growth is firm, slightly enlarged, and sore.  The patient also reports frequent urination, describing it as an urgency that often results in incontinence if they do not reach the bathroom in time. The patient notes a change in the smell of their urine, describing it as fruity.  The patient also mentions a history of urinary tract infections and yeast infections, for which they have been treated with antibiotics in the past. They report having recently completed a course of doxycycline.     04/08/2023    1:35 PM 01/21/2023   11:50 AM 06/20/2022    1:43 PM  Depression screen PHQ 2/9  Decreased Interest 0 0 0  Down, Depressed, Hopeless 0 0 0  PHQ - 2 Score 0 0 0  Altered sleeping   0  Tired, decreased energy   0  Change in appetite   0  Feeling bad or failure about yourself    0  Trouble concentrating   0  Moving slowly or fidgety/restless   0  Suicidal thoughts   0  PHQ-9 Score   0  Difficult doing work/chores   Not difficult at all   Medications: Outpatient Medications Prior to Visit  Medication Sig   albuterol (VENTOLIN HFA) 108 (90 Base) MCG/ACT inhaler Inhale 2 puffs into the lungs every 6 (six) hours as needed.    aspirin EC 81 MG tablet Take 1 tablet (81 mg total) by mouth daily. Swallow whole.   Blood Glucose Monitoring Suppl (ONETOUCH VERIO) w/Device KIT To check blood sugar once daily   Continuous Glucose Receiver (FREESTYLE LIBRE 2 READER) DEVI 1 each by Does not apply route as directed.   Continuous Glucose Sensor (FREESTYLE LIBRE 2 SENSOR) MISC 1 each by Does not apply route every 14 (fourteen) days.   empagliflozin (JARDIANCE) 25 MG TABS tablet Take 1 tablet (25 mg total) by mouth daily before breakfast.   fluconazole (DIFLUCAN) 150 MG tablet Take 1 tablet PO for vaginal yeast; repeat dose in 4 days for complete treatment.   glipiZIDE (GLUCOTROL) 10 MG tablet Take 1 tablet (10 mg total) by mouth 2 (two) times daily before a meal.   glucose blood (ONETOUCH VERIO) test strip To check blood sugar once daily   hydrocortisone cream 1 % Apply 1 Application topically 2 (two) times daily. External vaginal application   ibuprofen (ADVIL) 200 MG tablet Take 400 mg by mouth every 6 (six) hours as needed.   insulin degludec (TRESIBA FLEXTOUCH) 100 UNIT/ML FlexTouch Pen Inject 24 Units into the skin at bedtime.   Insulin Pen Needle (PEN NEEDLES) 32G X 6 MM MISC 1 each by Does not apply route at bedtime.   lisinopril-hydrochlorothiazide (ZESTORETIC)  20-12.5 MG tablet Take 1 tablet by mouth daily.   montelukast (SINGULAIR) 10 MG tablet Take 1 tablet by mouth once daily   OneTouch Delica Lancets 30G MISC 1 each by Does not apply route as directed.   pantoprazole (PROTONIX) 40 MG tablet Take 1 tablet (40 mg total) by mouth daily.   rosuvastatin (CRESTOR) 10 MG tablet Take 1 tablet by mouth once daily   spironolactone (ALDACTONE) 25 MG tablet Take 1 tablet (25 mg total) by mouth daily.   tirzepatide (MOUNJARO) 7.5 MG/0.5ML Pen Inject 7.5 mg into the skin once a week.   [DISCONTINUED] conjugated estrogens (PREMARIN) vaginal cream Place 1 Applicatorful vaginally daily. (Patient not taking: Reported on 05/05/2023)    [DISCONTINUED] doxycycline (VIBRA-TABS) 100 MG tablet Take 1 tablet (100 mg total) by mouth 2 (two) times daily. (Patient not taking: Reported on 05/05/2023)   [DISCONTINUED] loperamide (IMODIUM) 2 MG capsule Take 2 capsules (4 mg total) by mouth 4 (four) times daily as needed for diarrhea or loose stools. (Patient not taking: Reported on 05/05/2023)   [DISCONTINUED] Multiple Vitamins-Minerals (PRESERVISION AREDS 2) CAPS Take 1 capsule by mouth daily. (Patient not taking: Reported on 05/05/2023)   [DISCONTINUED] scopolamine (TRANSDERM-SCOP) 1 MG/3DAYS Place 1 patch (1.5 mg total) onto the skin every 3 (three) days. (Patient not taking: Reported on 05/05/2023)   [DISCONTINUED] SUMAtriptan (IMITREX) 50 MG tablet Take 1 tablet (50 mg total) by mouth every 2 (two) hours as needed for migraine. No more than 200mg  in 24 hours (Patient not taking: Reported on 05/05/2023)   No facility-administered medications prior to visit.    Review of Systems  All other systems reviewed and are negative.  Except see HPI       Objective    BP 130/75 (BP Location: Right Wrist, Patient Position: Sitting, Cuff Size: Normal)   Pulse 78   Temp 98.3 F (36.8 C) (Oral)   Resp 16   Ht 4\' 10"  (1.473 m)   Wt 215 lb (97.5 kg)   BMI 44.94 kg/m     Physical Exam Vitals reviewed.  Constitutional:      General: She is not in acute distress.    Appearance: Normal appearance. She is well-developed. She is obese. She is not diaphoretic.  HENT:     Head: Normocephalic and atraumatic.  Eyes:     General: No scleral icterus.    Conjunctiva/sclera: Conjunctivae normal.  Neck:     Thyroid: No thyromegaly.  Cardiovascular:     Rate and Rhythm: Normal rate and regular rhythm.     Pulses: Normal pulses.     Heart sounds: Normal heart sounds. No murmur heard. Pulmonary:     Effort: Pulmonary effort is normal. No respiratory distress.     Breath sounds: Normal breath sounds. No wheezing, rhonchi or rales.   Musculoskeletal:     Cervical back: Neck supple.     Right lower leg: No edema.     Left lower leg: No edema.  Lymphadenopathy:     Cervical: No cervical adenopathy.  Skin:    General: Skin is warm and dry.     Findings: Lesion present.  Neurological:     Mental Status: She is alert and oriented to person, place, and time. Mental status is at baseline.  Psychiatric:        Mood and Affect: Mood normal.        Behavior: Behavior normal.      Results for orders placed or performed in visit on 05/18/23  POCT urinalysis dipstick  Result Value Ref Range   Color, UA yellow    Clarity, UA cloudy    Glucose, UA Positive (A) Negative   Bilirubin, UA neg    Ketones, UA neg    Spec Grav, UA 1.025 1.010 - 1.025   Blood, UA neg    pH, UA 6.0 5.0 - 8.0   Protein, UA Negative Negative   Urobilinogen, UA 0.2 0.2 or 1.0 E.U./dL   Nitrite, UA neg    Leukocytes, UA Negative Negative   Appearance     Odor    POCT Glucose Fingerstick  Result Value Ref Range   Glucose 158 (A) 70 - 99    Assessment & Plan    1. Urine frequency Reports frequent urination and urgency. No signs of UTI on initial urinalysis. -Send urine for culture and microscopy to rule out UTI. -Consider referral to a urologist if symptoms persist, as they may indicate urinary incontinence or overactive bladder - POCT urinalysis dipstick - Urine Culture - Urinalysis, microscopic only - POCT Glucose Fingerstick Will reassess  2. Hidradenitis Chronic Reports a painful boil on the buttock. Previously treated with doxycycline in October 2024. -Apply warm compresses to the boil. -Prescribe a low dose of antibiotic as a precaution. -Consider further treatment if boil does not improve. - doxycycline (VIBRA-TABS) 100 MG tablet; Take 1 tablet (100 mg total) by mouth 2 (two) times daily.  Dispense: 14 tablet; Refill: 0 Take with meals and probiotics Will reassess  In the setting of morbid obesity and dmii Obesity Weight  loss of 5% of pt's current weight via healthy diet and daily exercise encouraged.  DMII Chronic and previously uncontrolled POCT UA dipstick showed glucose Continue her current regimen and ordered poct glucose Last A1c was 8.8. 2 month ago Encouraged to see PCP for management  No follow-ups on file.     The patient was advised to call back or seek an in-person evaluation if the symptoms worsen or if the condition fails to improve as anticipated.  I discussed the assessment and treatment plan with the patient. The patient was provided an opportunity to ask questions and all were answered. The patient agreed with the plan and demonstrated an understanding of the instructions.  I, Debera Lat, PA-C have reviewed all documentation for this visit. The documentation on  12/9/24for the exam, diagnosis, procedures, and orders are all accurate and complete.  Debera Lat, [facility], MMS [facility] [phone] (phone) [phone] (fax)  [facility]

## 2023-05-19 ENCOUNTER — Encounter: Payer: Self-pay | Admitting: Physician Assistant

## 2023-05-19 LAB — URINALYSIS, MICROSCOPIC ONLY
Casts: NONE SEEN /[LPF]
RBC, Urine: NONE SEEN /[HPF] (ref 0–2)

## 2023-05-20 LAB — URINE CULTURE

## 2023-05-21 ENCOUNTER — Other Ambulatory Visit: Payer: Self-pay | Admitting: Physician Assistant

## 2023-05-21 DIAGNOSIS — B379 Candidiasis, unspecified: Secondary | ICD-10-CM

## 2023-05-21 MED ORDER — FLUCONAZOLE 150 MG PO TABS
ORAL_TABLET | ORAL | 0 refills | Status: DC
Start: 2023-05-21 — End: 2023-08-03

## 2023-05-21 NOTE — Progress Notes (Signed)
Please, let pt know that her urine was positive for yeast infection but Urine culture was negative for UTI. Advised to drink plenty of water. I will call in diflucan.

## 2023-06-04 DIAGNOSIS — R519 Headache, unspecified: Secondary | ICD-10-CM | POA: Diagnosis not present

## 2023-06-04 DIAGNOSIS — Z20822 Contact with and (suspected) exposure to covid-19: Secondary | ICD-10-CM | POA: Diagnosis not present

## 2023-06-04 DIAGNOSIS — U071 COVID-19: Secondary | ICD-10-CM | POA: Diagnosis not present

## 2023-06-09 ENCOUNTER — Ambulatory Visit: Payer: Self-pay | Admitting: *Deleted

## 2023-06-09 NOTE — Telephone Encounter (Signed)
 Message from [name] sent at 06/09/2023 10:39 AM EST  Summary: covid Questions   +Covid on 12/26... taking medication for 6 days. I was told to take it for five days,  I still have meds left over , should I finish them and do I need a follow appt with my provider?          Call History  Contact Date/Time Type Contact Phone/Fax By  06/09/2023 10:35 AM EST Phone (Incoming) Alexis Lloyd, Alexis Lloyd (Self) [phone] MIKE) [name]

## 2023-06-09 NOTE — Telephone Encounter (Signed)
Attempted to return her call.   Left a voicemail to call back. 

## 2023-06-09 NOTE — Telephone Encounter (Signed)
 Chief Complaint: Question  Disposition: [] ED /[] Urgent Care (no appt availability in office) / [] Appointment(In office/virtual)/ []  [place] [facility]/ [x] Home Care/ [] Refused Recommended Disposition /[] [place] [facility]/ []  Follow-up with PCP Additional Notes: Patient states she was seen at the walk in clinic on 06/04/23 and tested positive for Covid. Patient was started on antiviral medication and stated that the directions advised to take medication for 5 days, patient states she has more than 5 days worth of medication. Advised patient to take as directed on the label. Patient also asked if she needed a follow-up visit, patient advised she will only need follow up if symptoms do not improve. Patient stated symptoms are improving at this time.  Summary: covid Questions   +Covid on 12/26... taking medication for 6 days. I was told to take it for five days,  I still have meds left over , should I finish them and do I need a follow appt with my provider?     Reason for Disposition  Health Information question, no triage required and triager able to answer question  Answer Assessment - Initial Assessment Questions 1. REASON FOR CALL or QUESTION: What is your reason for calling today? or How can I best help you? or What question do you have that I can help answer?     Do I need to keep taking this medication?  Protocols used: Information Only Call - No Triage-A-AH

## 2023-06-14 ENCOUNTER — Other Ambulatory Visit: Payer: Self-pay | Admitting: Family Medicine

## 2023-06-14 DIAGNOSIS — I152 Hypertension secondary to endocrine disorders: Secondary | ICD-10-CM

## 2023-06-22 ENCOUNTER — Telehealth: Payer: Self-pay

## 2023-06-22 NOTE — Telephone Encounter (Signed)
 Copied from CRM [id number]. Topic: General - Other >> Jun 22, 2023  8:08 AM [name] wrote: Reason for CRM: The patient would like to be contacted by a member of staff when possible to discuss an alternate prescription for Insulin  Pen Needle (PEN NEEDLES) 32G X 6 MM MISC [[id number]]  The patient would like to be prescribed larger needles preferably 7 MM   Please contact the patient further when possible

## 2023-06-30 DIAGNOSIS — E1165 Type 2 diabetes mellitus with hyperglycemia: Secondary | ICD-10-CM | POA: Diagnosis not present

## 2023-07-02 DIAGNOSIS — I152 Hypertension secondary to endocrine disorders: Secondary | ICD-10-CM | POA: Diagnosis not present

## 2023-07-02 DIAGNOSIS — Z6841 Body Mass Index (BMI) 40.0 and over, adult: Secondary | ICD-10-CM | POA: Diagnosis not present

## 2023-07-02 DIAGNOSIS — E785 Hyperlipidemia, unspecified: Secondary | ICD-10-CM | POA: Diagnosis not present

## 2023-07-02 DIAGNOSIS — R300 Dysuria: Secondary | ICD-10-CM | POA: Diagnosis not present

## 2023-07-02 DIAGNOSIS — E1169 Type 2 diabetes mellitus with other specified complication: Secondary | ICD-10-CM | POA: Diagnosis not present

## 2023-07-02 DIAGNOSIS — J449 Chronic obstructive pulmonary disease, unspecified: Secondary | ICD-10-CM | POA: Diagnosis not present

## 2023-07-02 DIAGNOSIS — E1159 Type 2 diabetes mellitus with other circulatory complications: Secondary | ICD-10-CM | POA: Diagnosis not present

## 2023-07-02 DIAGNOSIS — E1165 Type 2 diabetes mellitus with hyperglycemia: Secondary | ICD-10-CM | POA: Diagnosis not present

## 2023-07-03 ENCOUNTER — Telehealth: Payer: Self-pay

## 2023-07-03 ENCOUNTER — Other Ambulatory Visit: Payer: Self-pay

## 2023-07-03 NOTE — Patient Outreach (Signed)
  Care Management   Outreach Note  07/03/2023 Name: Alexis Lloyd MRN: [id number] DOB: 1961/01/11  An unsuccessful outreach attempt was made today for a scheduled Care Management visit.    Follow Up Plan:  A HIPAA compliant phone message was left for the patient providing contact information and requesting a return call.     Juanell Fairly [facility] RN Care Manager Direct Dial: [phone]  Fax: [phone] Website: [email or web address]

## 2023-07-07 ENCOUNTER — Ambulatory Visit (INDEPENDENT_AMBULATORY_CARE_PROVIDER_SITE_OTHER): Payer: Medicare HMO | Admitting: Family Medicine

## 2023-07-07 ENCOUNTER — Ambulatory Visit: Payer: Self-pay | Admitting: *Deleted

## 2023-07-07 ENCOUNTER — Encounter: Payer: Self-pay | Admitting: Family Medicine

## 2023-07-07 VITALS — BP 124/70 | HR 91 | Resp 16 | Ht 59.0 in | Wt 213.0 lb

## 2023-07-07 DIAGNOSIS — R109 Unspecified abdominal pain: Secondary | ICD-10-CM | POA: Diagnosis not present

## 2023-07-07 DIAGNOSIS — R300 Dysuria: Secondary | ICD-10-CM

## 2023-07-07 LAB — POCT URINALYSIS DIPSTICK
Appearance: NORMAL
Bilirubin, UA: NEGATIVE
Glucose, UA: POSITIVE — AB
Ketones, UA: NEGATIVE
Leukocytes, UA: NEGATIVE
Nitrite, UA: NEGATIVE
Protein, UA: NEGATIVE
Spec Grav, UA: 1.01 (ref 1.010–1.025)
Urobilinogen, UA: 0.2 U/dL
pH, UA: 6 (ref 5.0–8.0)

## 2023-07-07 MED ORDER — CEPHALEXIN 500 MG PO CAPS: 500.0000 mg | ORAL_CAPSULE | Freq: Two times a day (BID) | ORAL | 0 refills | Status: DC

## 2023-07-07 MED ORDER — PHENAZOPYRIDINE HCL 100 MG PO TABS: 100.0000 mg | ORAL_TABLET | Freq: Three times a day (TID) | ORAL | 0 refills | Status: DC | PRN

## 2023-07-07 NOTE — Telephone Encounter (Signed)
Attempted to return her call.   Left a voicemail to call back.

## 2023-07-07 NOTE — Telephone Encounter (Signed)
Message from [place] F sent at 07/07/2023  9:30 AM EST  Summary: Bladder or Kidney infection   Pt is calling in because she believes she has a bladder infection or kidney infection. Pt says it hurts to urinate and that she is experiencing pain in her back and in her side. Pt has an appointment set for 07/09/23 but says her symptoms are worsening. Pt wants to know if she can get an antibiotic or some kind of medicine.          Call History  Contact Date/Time Type Contact Phone/Fax By  07/07/2023 09:28 AM EST Phone (Incoming) Alexis, [place] Lloyd (Self) [phone] Alexis Lloyd) Colon Flattery M

## 2023-07-07 NOTE — Telephone Encounter (Signed)
Additional Information . Commented on: Answer Assessment    Pt having frequency, urgency and burning pain.  N? To pregnacy question  Protocols used: Urinary Symptoms-A-AH

## 2023-07-07 NOTE — Progress Notes (Signed)
Patient ID: Alexis Lloyd, female    DOB: 1961/06/09, 63 y.o.   MRN: [id number]  PCP: Jacky Kindle, FNP (Inactive)  Chief Complaint  Patient presents with   Dysuria    X5 days, hasn't tried OTC meds   Flank Pain    Subjective:   Alexis Lloyd is a 63 y.o. female, presents to clinic with CC of the following:  HPI  Patient presents with about 5 days of dysuria and urinary frequency and urgency with a little bit of suprapubic discomfort that is wrapping around her right side, she denies abdominal pain fever nausea vomiting flank pain No vaginal symptoms as she is prone to yeast infections and bladder infections she reports no risk of STD exposure Dip today reviewed with the patient Results for orders placed or performed in visit on 07/07/23  POCT urinalysis dipstick   Collection Time: 07/07/23 11:17 AM  Result Value Ref Range   Color, UA Yellow    Clarity, UA Clear    Glucose, UA Positive (A) Negative   Bilirubin, UA Negative    Ketones, UA Negative    Spec Grav, UA 1.010 1.010 - 1.025   Blood, UA Large (A)    pH, UA 6.0 5.0 - 8.0   Protein, UA Negative Negative   Urobilinogen, UA 0.2 0.2 or 1.0 E.U./dL   Nitrite, UA Negative    Leukocytes, UA Negative Negative   Appearance Normal    Odor None    Dip reviewed with blood and glucose she did recently see her endocrinologist and a complete urinalysis with microscopy was done which also showed glucosuria and hematuria she is on Jardiance A1C per care everywhere is 7.9 on 1/23 Lab Results  Component Value Date   HGBA1C 8.6 10/22/2022     Patient Active Problem List   Diagnosis Date Noted   Vulval hidradenitis suppurativa 04/08/2023   Morbid obesity (HCC) 04/08/2023   At risk for sexually transmitted disease due to unprotected sex 03/09/2023   Vaginal discomfort 03/09/2023   Vaginal sore 03/09/2023   Hyperlipidemia associated with type 2 diabetes mellitus (HCC) 05/17/2021   Uncontrolled type 2 diabetes mellitus  with hyperglycemia, with long-term current use of insulin (HCC) 05/17/2021   Diabetic neuropathy (HCC) 11/18/2014   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 Swallow whole., Disp: 90 tablet, Rfl: 4   Blood Glucose Monitoring Suppl (ONETOUCH VERIO) w/Device KIT, To check blood sugar once daily, Disp: 1 kit, Rfl: 0   Continuous Glucose Receiver (FREESTYLE LIBRE 2 READER) DEVI, 1 each by Does not apply route as directed., Disp: 1 each, Rfl: 1   Continuous Glucose Sensor (FREESTYLE LIBRE 2 SENSOR) MISC, 1 each by Does not apply route every 14 (fourteen) days., Disp: 6 each, Rfl: 11   empagliflozin (JARDIANCE) 25 MG TABS tablet, Take 1 tablet (25 mg total) by mouth daily before breakfast., Disp: 90 tablet, Rfl: 4   fluconazole (DIFLUCAN) 150 MG tablet, Take 1 tablet PO for vaginal yeast; repeat dose in 4 days for complete treatment., Disp: 2 tablet, Rfl: 0   glipiZIDE (GLUCOTROL) 10 MG tablet, Take 1 tablet (  10 mg total) by mouth 2 (two) times daily before a meal., Disp: 180 tablet, Rfl: 3   glucose blood (ONETOUCH VERIO) test strip, To check blood sugar once daily, Disp: 100 each, Rfl: 3   hydrocortisone cream 1 %, Apply 1 Application topically 2 (two) times daily. External vaginal application, Disp: 30 g, Rfl: 0   ibuprofen (ADVIL) 200 MG tablet, Take 400 mg by mouth every 6 (six) hours as needed., Disp: , Rfl:    insulin degludec (TRESIBA FLEXTOUCH) 100 UNIT/ML FlexTouch Pen, Inject 24 Units into the skin at bedtime., Disp: , Rfl:    Insulin Pen Needle (PEN NEEDLES) 32G X 6 MM MISC, 1 each by Does not apply route at bedtime., Disp: 100 each, Rfl: 1    lisinopril-hydrochlorothiazide (ZESTORETIC) 20-12.5 MG tablet, Take 1 tablet by mouth daily., Disp: 180 tablet, Rfl: 0   montelukast (SINGULAIR) 10 MG tablet, Take 1 tablet by mouth once daily, Disp: 90 tablet, Rfl: 0   OneTouch Delica Lancets 30G MISC, 1 each by Does not apply route as directed., Disp: 100 each, Rfl: 11   pantoprazole (PROTONIX) 40 MG tablet, Take 1 tablet (40 mg total) by mouth daily., Disp: 90 tablet, Rfl: 1   rosuvastatin (CRESTOR) 10 MG tablet, Take 1 tablet by mouth once daily, Disp: 90 tablet, Rfl: 0   spironolactone (ALDACTONE) 25 MG tablet, Take 1 tablet by mouth once daily, Disp: 90 tablet, Rfl: 0   tirzepatide (MOUNJARO) 7.5 MG/0.5ML Pen, Inject 7.5 mg into the skin once a week., Disp: 6 mL, Rfl: 0   doxycycline (VIBRA-TABS) 100 MG tablet, Take 1 tablet (100 mg total) by mouth 2 (two) times daily. (Patient not taking: Reported on 07/07/2023), Disp: 14 tablet, Rfl: 0   Allergies  Allergen Reactions   Allegra [Fexofenadine] Nausea And Vomiting   Avelox [Moxifloxacin] Other (See Comments)    Hallucinations   Bactrim [Sulfamethoxazole-Trimethoprim] Other (See Comments)    unknown   Etodolac Nausea Only   Fish-Derived Products Nausea And Vomiting    With "seafood"   Penicillins Diarrhea and Nausea And Vomiting   Shellfish Allergy Nausea Only   Sulfa Antibiotics Other (See Comments)    unknown   Macrobid [Nitrofurantoin Monohyd Macro] Other (See Comments)    Constipation and globus hystericus.     Social History   Tobacco Use   Smoking status: Former    Current packs/day: 0.00    Average packs/day: 0.3 packs/day for 21.0 years (5.3 ttl pk-yrs)    Types: Cigarettes    Start date: 06/08/1976    Quit date: 06/08/1997    Years since quitting: 26.0   Smokeless tobacco: Never  Vaping Use   Vaping status: Never Used  Substance Use Topics   Alcohol use: No   Drug use: No      Chart Review Today: I personally reviewed active problem list, medication list,  allergies, family history, social history, health maintenance, notes from last encounter, lab results, imaging with the patient/caregiver today.   Review of Systems  Constitutional: Negative.  Negative for activity change, appetite change, chills, diaphoresis, fatigue and fever.  HENT: Negative.    Eyes: Negative.   Respiratory: Negative.    Cardiovascular: Negative.   Gastrointestinal: Negative.  Negative for abdominal pain and nausea.  Endocrine: Negative.   Genitourinary: Negative.   Musculoskeletal: Negative.   Skin: Negative.   Allergic/Immunologic: Negative.   Neurological: Negative.   Hematological: Negative.   Psychiatric/Behavioral: Negative.    All other systems reviewed and are negative.  Objective:   Vitals:   07/07/23 1115  BP: 124/70  Pulse: 91  Resp: 16  SpO2: 99%  Weight: 213 lb (96.6 kg)  Height: 4\' 11"  (1.499 m)    Body mass index is 43.02 kg/m.  Physical Exam Vitals and nursing note reviewed.  Constitutional:      General: She is not in acute distress.    Appearance: Normal appearance. She is well-developed. She is obese. She is not ill-appearing, toxic-appearing or diaphoretic.  HENT:     Head: Normocephalic and atraumatic.     Nose: Nose normal.  Eyes:     General:        Right eye: No discharge.        Left eye: No discharge.     Conjunctiva/sclera: Conjunctivae normal.  Neck:     Trachea: No tracheal deviation.  Cardiovascular:     Rate and Rhythm: Normal rate and regular rhythm.     Pulses: Normal pulses.     Heart sounds: Normal heart sounds.  Pulmonary:     Effort: Pulmonary effort is normal. No respiratory distress.     Breath sounds: Normal breath sounds. No stridor. No wheezing, rhonchi or rales.  Abdominal:     General: Bowel sounds are normal.     Palpations: Abdomen is soft.     Tenderness: There is no abdominal tenderness. There is no right CVA tenderness, left CVA tenderness, guarding or rebound.  Skin:    General:  Skin is warm and dry.     Findings: No rash.  Neurological:     Mental Status: She is alert. Mental status is at baseline.     Motor: No abnormal muscle tone.     Coordination: Coordination normal.  Psychiatric:        Mood and Affect: Mood normal.        Behavior: Behavior normal.      Results for orders placed or performed in visit on 07/07/23  POCT urinalysis dipstick   Collection Time: 07/07/23 11:17 AM  Result Value Ref Range   Color, UA Yellow    Clarity, UA Clear    Glucose, UA Positive (A) Negative   Bilirubin, UA Negative    Ketones, UA Negative    Spec Grav, UA 1.010 1.010 - 1.025   Blood, UA Large (A)    pH, UA 6.0 5.0 - 8.0   Protein, UA Negative Negative   Urobilinogen, UA 0.2 0.2 or 1.0 E.U./dL   Nitrite, UA Negative    Leukocytes, UA Negative Negative   Appearance Normal    Odor None        Assessment & Plan:   1. Dysuria (Primary) Dysuria frequency urgency x 5 days with reported suprapubic discomfort with benign abdominal exam and well-appearing patient Dip in office positive for hematuria but otherwise unremarkable except for expected glucose due to Jardiance use We will start antibiotics given her reported symptoms for presumed UTI/simple cystitis and will follow urine culture - Urine Culture - POCT urinalysis dipstick - cephALEXin (KEFLEX) 500 MG capsule; Take 1 capsule (500 mg total) by mouth 2 (two) times daily for 5 days.  Dispense: 10 capsule; Refill: 0 - phenazopyridine (PYRIDIUM) 100 MG tablet; Take 1 tablet (100 mg total) by mouth 3 (three) times daily as needed for pain.  Dispense: 10 tablet; Refill: 0      Danelle Berry, PA-C 07/07/23 11:30 AM

## 2023-07-07 NOTE — Telephone Encounter (Addendum)
Message from Alexis Lloyd sent at 07/07/2023  9:30 AM EST  Summary: Bladder or Kidney infection   Pt is calling in because she believes she has a bladder infection or kidney infection. Pt says it hurts to urinate and that she is experiencing pain in her back and in her side. Pt has an appointment set for 07/09/23 but says her symptoms are worsening. Pt wants to know if she can get an antibiotic or some kind of medicine.         Chief Complaint: right severe flank pain//right lower back pain Symptoms: burning with urination, frequency and urgency Frequency: 07/01/23 Pertinent Negatives: Patient denies fever or blood in urine Disposition: [] ED /[x] Urgent Care (no appt availability in office) / [] Appointment(In office/virtual)/ []  [facility]/ [] Home Care/ [] Refused Recommended Disposition /[] [facility]/ []  Follow-up with PCP Additional Notes: made appt at [facility]. Pt given address and [address] number.  Reason for Disposition  Side (flank) or lower back pain present  Answer Assessment - Initial Assessment Questions 1. SYMPTOM: "What's the main symptom you're concerned about?" (e.g., frequency, incontinence)     *No Answer* 2. ONSET: "When did the  *No Answer*  start?"     Last Thursday  3. PAIN: "Is there any pain?" If Yes, ask: "How bad is it?" (Scale: 1-10; mild, moderate, severe)     Yes flank 9/10 and right  back pain  4. CAUSE: "What do you think is causing the symptoms?"     Bladder or kidney infection 5. OTHER SYMPTOMS: "Do you have any other symptoms?" (e.g., blood in urine, fever, flank pain, pain with urination)     Burns with urination, urgency, frequency  6. PREGNANCY: "Is there any chance you are pregnant?" "When was your last menstrual period?"     *No Answer*  Protocols used: Urinary Symptoms-A-AH

## 2023-07-09 ENCOUNTER — Ambulatory Visit: Payer: Medicare HMO | Admitting: Physician Assistant

## 2023-07-09 ENCOUNTER — Other Ambulatory Visit: Payer: Self-pay | Admitting: Family Medicine

## 2023-07-09 DIAGNOSIS — E1159 Type 2 diabetes mellitus with other circulatory complications: Secondary | ICD-10-CM

## 2023-07-09 LAB — URINE CULTURE
MICRO NUMBER:: 16009772
SPECIMEN QUALITY:: ADEQUATE

## 2023-07-09 MED ORDER — CIPROFLOXACIN HCL 500 MG PO TABS: 500.0000 mg | ORAL_TABLET | Freq: Two times a day (BID) | ORAL | 0 refills | Status: AC

## 2023-07-14 ENCOUNTER — Telehealth: Payer: Self-pay | Admitting: Family Medicine

## 2023-07-14 ENCOUNTER — Other Ambulatory Visit: Payer: Self-pay

## 2023-07-14 DIAGNOSIS — I10 Essential (primary) hypertension: Secondary | ICD-10-CM

## 2023-07-14 DIAGNOSIS — J40 Bronchitis, not specified as acute or chronic: Secondary | ICD-10-CM

## 2023-07-14 NOTE — Telephone Encounter (Signed)
[facility] faxed refill request for the following medications:   lisinopril-hydrochlorothiazide (ZESTORETIC) 20-12.5 MG tablet   montelukast (SINGULAIR) 10 MG tablet    Please advise.

## 2023-07-16 ENCOUNTER — Ambulatory Visit (INDEPENDENT_AMBULATORY_CARE_PROVIDER_SITE_OTHER): Payer: Medicare HMO | Admitting: Family Medicine

## 2023-07-16 ENCOUNTER — Encounter: Payer: Self-pay | Admitting: Family Medicine

## 2023-07-16 VITALS — BP 112/62 | HR 60 | Ht 59.0 in | Wt 209.0 lb

## 2023-07-16 DIAGNOSIS — E1169 Type 2 diabetes mellitus with other specified complication: Secondary | ICD-10-CM | POA: Diagnosis not present

## 2023-07-16 DIAGNOSIS — E785 Hyperlipidemia, unspecified: Secondary | ICD-10-CM | POA: Diagnosis not present

## 2023-07-16 DIAGNOSIS — E1165 Type 2 diabetes mellitus with hyperglycemia: Secondary | ICD-10-CM

## 2023-07-16 DIAGNOSIS — I152 Hypertension secondary to endocrine disorders: Secondary | ICD-10-CM

## 2023-07-16 DIAGNOSIS — L989 Disorder of the skin and subcutaneous tissue, unspecified: Secondary | ICD-10-CM | POA: Diagnosis not present

## 2023-07-16 DIAGNOSIS — Z794 Long term (current) use of insulin: Secondary | ICD-10-CM

## 2023-07-16 DIAGNOSIS — E1159 Type 2 diabetes mellitus with other circulatory complications: Secondary | ICD-10-CM | POA: Diagnosis not present

## 2023-07-16 MED ORDER — DAPAGLIFLOZIN PROPANEDIOL 10 MG PO TABS: 10.0000 mg | ORAL_TABLET | Freq: Every day | ORAL | Status: DC

## 2023-07-16 MED ORDER — MONTELUKAST SODIUM 10 MG PO TABS: 10.0000 mg | ORAL_TABLET | Freq: Every day | ORAL | 0 refills | Status: DC

## 2023-07-16 MED ORDER — LISINOPRIL-HYDROCHLOROTHIAZIDE 20-12.5 MG PO TABS: 1.0000 | ORAL_TABLET | Freq: Every day | ORAL | 1 refills | Status: DC

## 2023-07-16 NOTE — Assessment & Plan Note (Signed)
 chronic Hypertension, currently on lisinopril  20 mg/hydrochlorothiazide  12.5 mg daily and spironolactone  25 mg daily. Blood pressure today was 123/48, slightly low. Discussed protective benefits of lisinopril  for kidneys. Long-standing hypertension since pregnancy-related toxemia. - Continue lisinopril  20 mg/hydrochlorothiazide  12.5 mg daily - Continue spironolactone  25 mg daily - Recheck blood pressure

## 2023-07-16 NOTE — Assessment & Plan Note (Signed)
 Chronic Type 2 Diabetes Mellitus. Currently on glipizide  10 mg twice daily, Tresiba  26 units daily, and Farxiga  10 mg daily. Reports not taking Jardiance . Recent A1c was 7.9. Blood pressure slightly low at 123/48. Weight decreased from 213 lbs to 209 lbs. Discussed potential for UTIs with Farxiga  and Jardiance . Patient prefers Farxiga  despite cost and UTI risk due to its benefits for kidney protection and blood sugar control. - Order hemoglobin A1c - Continue glipizide  10 mg twice daily - Continue Tresiba  26 units daily - Continue Farxiga  10 mg daily - Discontinue Jardiance  - Recheck blood pressure - DM foot exam completed today  - Follow up in 3 months

## 2023-07-16 NOTE — Progress Notes (Signed)
 Established patient visit   Patient: Alexis Lloyd   DOB: 12-20-1960   63 y.o. Female  MRN: [id number] Visit Date: 07/16/2023  Today's healthcare provider: Rockie Agent, MD   Chief Complaint  Patient presents with   Medical Management of Chronic Issues   Hip Pain   Subjective       Discussed the use of AI scribe software for clinical note transcription with the patient, who gave verbal consent to proceed.  History of Present Illness   Cam D Alexis Lloyd is a 63 year old female with hypertension and diabetes who presents for follow-up.  She is managing her hypertension and diabetes with glipizide  10 mg twice daily, lisinopril  20 mg with hydrochlorothiazide  12.5 mg daily, spironolactone  25 mg daily, and Mounjaro  7.5 mg weekly, although she has not been taking Mounjaro  due to not receiving it. She is also on Crestor  10 mg daily for hyperlipidemia associated with type 2 diabetes and Protonix  40 mg daily for gastric reflux.  She experiences right-sided back pain, initially evaluated by another provider, Olam, who diagnosed a urinary tract infection (UTI) after a positive culture. Despite medication, the side pain persists. She has a history of right knee replacement two years ago and wonders if the pain has radiated to her hip. She also describes burning with urination, which was treated with medication that altered her urine color. She has recurrent UTIs and was informed that Farxiga  might contribute to this issue. She is currently on Farxiga  10 mg, obtained through a medication program.  She has a history of high blood pressure since having her children, with a history of toxemia. She has been on lisinopril  for many years and notes that her blood pressure has been low recently, even without taking her medication. She is concerned about the low readings, particularly the diastolic pressure.  She has been dealing with a skin condition on both ankles, described as dark macules  that sometimes itch, especially when her anxiety is high. She has seen a dermatologist in the past but was not satisfied with the care. She is considering seeing a new dermatologist for further evaluation.  She reports a weight loss from 215 lbs to 209 lbs, attributing it to her current regimen, including Ozempic  1 mg. 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         Past Medical History:  Diagnosis Date   Allergy    Arthritis    Breast cancer (HCC)    Breast cancer, left breast (HCC) 10/04/2014   Diabetes mellitus without complication (HCC)    GERD (gastroesophageal reflux disease)    Hyperlipidemia    Hypertension    Osteoporosis    Sleep apnea     Medications: Outpatient Medications Prior to Visit  Medication Sig   albuterol  (VENTOLIN  HFA) 108 (90 Base) MCG/ACT inhaler Inhale 2 puffs into the lungs every 6 (six) hours as needed.   aspirin  EC 81 MG tablet Take 1 tablet (81 mg total) by mouth daily. Swallow whole.   Blood Glucose Monitoring Suppl (ONETOUCH VERIO) w/Device KIT To check blood sugar once daily   Continuous Glucose Receiver (FREESTYLE LIBRE 2 READER) DEVI 1 each by Does not apply route as directed.   Continuous Glucose Sensor (FREESTYLE LIBRE 2 SENSOR) MISC 1 each by Does not apply route every 14 (fourteen) days.   fluconazole  (DIFLUCAN ) 150 MG tablet Take 1 tablet PO for vaginal yeast; repeat dose in 4 days for  complete treatment.   glipiZIDE  (GLUCOTROL ) 10 MG tablet Take 1 tablet (10 mg total) by mouth 2 (two) times daily before a meal.   glucose blood (ONETOUCH VERIO) test strip To check blood sugar once daily   hydrocortisone  cream 1 % Apply 1 Application topically 2 (two) times daily. External vaginal application   ibuprofen  (ADVIL ) 200 MG tablet Take 400 mg by mouth every 6 (six) hours as needed.   insulin  degludec (TRESIBA  FLEXTOUCH) 100 UNIT/ML FlexTouch Pen Inject 26 Units into the skin at bedtime.   Insulin  Pen  Needle (PEN NEEDLES) 32G X 6 MM MISC 1 each by Does not apply route at bedtime.   OneTouch Delica Lancets 30G MISC 1 each by Does not apply route as directed.   pantoprazole  (PROTONIX ) 40 MG tablet Take 1 tablet (40 mg total) by mouth daily.   phenazopyridine  (PYRIDIUM ) 100 MG tablet Take 1 tablet (100 mg total) by mouth 3 (three) times daily as needed for pain.   rosuvastatin  (CRESTOR ) 10 MG tablet Take 1 tablet by mouth once daily   spironolactone  (ALDACTONE ) 25 MG tablet Take 1 tablet by mouth once daily   [DISCONTINUED] empagliflozin  (JARDIANCE ) 25 MG TABS tablet Take 1 tablet (25 mg total) by mouth daily before breakfast.   [DISCONTINUED] lisinopril -hydrochlorothiazide  (ZESTORETIC ) 20-12.5 MG tablet Take 1 tablet by mouth daily.   [DISCONTINUED] montelukast  (SINGULAIR ) 10 MG tablet Take 1 tablet by mouth once daily   [DISCONTINUED] tirzepatide  (MOUNJARO ) 7.5 MG/0.5ML Pen Inject 7.5 mg into the skin once a week.   No facility-administered medications prior to visit.    Review of Systems  Last CBC Lab Results  Component Value Date   WBC 9.0 03/09/2023   HGB 12.4 03/09/2023   HCT 38.9 03/09/2023   MCV 87 03/09/2023   MCH 27.7 03/09/2023   RDW 13.7 03/09/2023   PLT 310 03/09/2023   Last metabolic panel Lab Results  Component Value Date   GLUCOSE 158 (A) 05/18/2023   NA 140 03/09/2023   K 4.0 03/09/2023   CL 100 03/09/2023   CO2 23 03/09/2023   BUN 19 03/09/2023   CREATININE 1.05 (H) 03/09/2023   EGFR 59 (L) 03/09/2023   CALCIUM  10.2 03/09/2023   PROT 7.2 03/09/2023   ALBUMIN 4.2 03/09/2023   LABGLOB 3.0 03/09/2023   AGRATIO 1.4 05/29/2022   BILITOT 0.2 03/09/2023   ALKPHOS 93 03/09/2023   AST 11 03/09/2023   ALT 18 03/09/2023   ANIONGAP 7 06/15/2020   Last lipids Lab Results  Component Value Date   CHOL 153 03/09/2023   HDL 28 (L) 03/09/2023   LDLCALC 80 03/09/2023   TRIG 269 (H) 03/09/2023   CHOLHDL 5.5 (H) 03/09/2023   Last hemoglobin A1c Lab Results   Component Value Date   HGBA1C 8.6 10/22/2022   Last thyroid  functions Lab Results  Component Value Date   TSH 1.630 03/09/2023        Objective    BP 112/62 (Cuff Size: Large)   Pulse 60   Ht 4' 11 (1.499 m)   Wt 209 lb (94.8 kg)   BMI 42.21 kg/m  BP Readings from Last 3 Encounters:  07/16/23 112/62  07/07/23 124/70  05/18/23 130/75   Wt Readings from Last 3 Encounters:  07/16/23 209 lb (94.8 kg)  07/07/23 213 lb (96.6 kg)  05/18/23 215 lb (97.5 kg)        Physical Exam  Physical Exam   VITALS: BP- 123/48, BP- 112/62 MEASUREMENTS: WT- 209 lbs CHEST:  Lungs clear to auscultation, no wheezing, no crackles. NEUROLOGICAL: Sensation intact in feet. SKIN: Hyperpigmented plaques on both ankles.            No results found for any visits on 07/16/23.  Assessment & Plan     Problem List Items Addressed This Visit       Cardiovascular and Mediastinum   Hypertension associated with diabetes (HCC) - Primary   chronic Hypertension, currently on lisinopril  20 mg/hydrochlorothiazide  12.5 mg daily and spironolactone  25 mg daily. Blood pressure today was 123/48, slightly low. Discussed protective benefits of lisinopril  for kidneys. Long-standing hypertension since pregnancy-related toxemia. - Continue lisinopril  20 mg/hydrochlorothiazide  12.5 mg daily - Continue spironolactone  25 mg daily - Recheck blood pressure      Relevant Medications   dapagliflozin  propanediol (FARXIGA ) 10 MG TABS tablet     Endocrine   Uncontrolled type 2 diabetes mellitus with hyperglycemia, with long-term current use of insulin  (HCC)   Chronic Type 2 Diabetes Mellitus. Currently on glipizide  10 mg twice daily, Tresiba  26 units daily, and Farxiga  10 mg daily. Reports not taking Jardiance . Recent A1c was 7.9. Blood pressure slightly low at 123/48. Weight decreased from 213 lbs to 209 lbs. Discussed potential for UTIs with Farxiga  and Jardiance . Patient prefers Farxiga  despite cost and UTI  risk due to its benefits for kidney protection and blood sugar control. - Order hemoglobin A1c - Continue glipizide  10 mg twice daily - Continue Tresiba  26 units daily - Continue Farxiga  10 mg daily - Discontinue Jardiance  - Recheck blood pressure - DM foot exam completed today  - Follow up in 3 months      Relevant Medications   dapagliflozin  propanediol (FARXIGA ) 10 MG TABS tablet   Other Relevant Orders   Hemoglobin A1c   Hyperlipidemia associated with type 2 diabetes mellitus (HCC)   Hyperlipidemia associated with Type 2 Diabetes. Currently on Crestor  10 mg daily. chronic - Continue Crestor  10 mg daily      Relevant Medications   dapagliflozin  propanediol (FARXIGA ) 10 MG TABS tablet   Other Visit Diagnoses       Grouped skin lesions       Relevant Orders   Ambulatory referral to Dermatology             Urinary Tract Infection (UTI) Reports right-sided back pain and dysuria. Recently treated for UTI with positive culture. Pain persists, possibly radiating to the hip. - Consider further evaluation if pain persists   Gastroesophageal Reflux Disease (GERD) GERD. Currently on Protonix  40 mg daily. - Continue Protonix  40 mg daily  Dermatological Condition Hyperpigmented plaques on both ankles, possibly dermatofibromas. Reports pruritus associated with anxiety. Previous treatments ineffective. Discussed need for dermatology referral and possible biopsy. - Refer to [facility] for evaluation and possible biopsy   General Health Maintenance Up to date on tetanus (2019) and had one dose of Shingrix  in 2023. Pneumonia vaccine last received in 2017. - Administer second dose of Shingrix  - Administer Prevnar 20 for pneumonia prevention   Return in about 3 months (around 10/13/2023) for CHRONIC F/U.         Rockie Agent, MD  [facility] [phone] (phone) [phone] (fax)  [facility]

## 2023-07-16 NOTE — Assessment & Plan Note (Signed)
 Hyperlipidemia associated with Type 2 Diabetes. Currently on Crestor  10 mg daily. chronic - Continue Crestor  10 mg daily

## 2023-07-17 LAB — HEMOGLOBIN A1C
Est. average glucose Bld gHb Est-mCnc: 183 mg/dL
Hgb A1c MFr Bld: 8 % — ABNORMAL HIGH (ref 4.8–5.6)

## 2023-07-20 ENCOUNTER — Telehealth: Payer: Self-pay | Admitting: Family Medicine

## 2023-07-20 MED ORDER — LACTULOSE 10 GM/15ML PO SOLN
10.0000 g | Freq: Three times a day (TID) | ORAL | 0 refills | Status: DC
Start: 2023-07-20 — End: 2023-10-14

## 2023-07-20 NOTE — Telephone Encounter (Signed)
 Pt is calling in requesting that Dr. Verdia Glad send her in an enema. Pt says she has been taking Citrus Nitrate but it isn't doing what she needs it to do. Pt is requesting someone the called in. Pt says it has to be an enema and she wants it sent to [facility] on [address]. Please follow up with pt.

## 2023-07-20 NOTE — Telephone Encounter (Signed)
 Prescribed lactulose  (10mg  TID as needed) to help with constipation.  Patient can purchase enema OTC

## 2023-07-21 NOTE — Telephone Encounter (Signed)
Patient aware of provider message.

## 2023-08-03 ENCOUNTER — Other Ambulatory Visit (HOSPITAL_COMMUNITY)
Admission: RE | Admit: 2023-08-03 | Discharge: 2023-08-03 | Disposition: A | Discharge status: Home / Self Care | Payer: Medicare HMO | Source: Ambulatory Visit | Attending: Family Medicine | Admitting: Family Medicine

## 2023-08-03 ENCOUNTER — Ambulatory Visit: Payer: Self-pay | Admitting: Family Medicine

## 2023-08-03 ENCOUNTER — Ambulatory Visit (INDEPENDENT_AMBULATORY_CARE_PROVIDER_SITE_OTHER): Payer: Medicare HMO | Admitting: Family Medicine

## 2023-08-03 VITALS — BP 112/54 | Resp 18 | Ht 59.0 in | Wt 212.0 lb

## 2023-08-03 DIAGNOSIS — K219 Gastro-esophageal reflux disease without esophagitis: Secondary | ICD-10-CM | POA: Diagnosis not present

## 2023-08-03 DIAGNOSIS — R432 Parageusia: Secondary | ICD-10-CM | POA: Diagnosis not present

## 2023-08-03 DIAGNOSIS — Z7984 Long term (current) use of oral hypoglycemic drugs: Secondary | ICD-10-CM

## 2023-08-03 DIAGNOSIS — I152 Hypertension secondary to endocrine disorders: Secondary | ICD-10-CM

## 2023-08-03 DIAGNOSIS — N898 Other specified noninflammatory disorders of vagina: Secondary | ICD-10-CM | POA: Insufficient documentation

## 2023-08-03 DIAGNOSIS — B379 Candidiasis, unspecified: Secondary | ICD-10-CM

## 2023-08-03 DIAGNOSIS — E1159 Type 2 diabetes mellitus with other circulatory complications: Secondary | ICD-10-CM

## 2023-08-03 NOTE — Progress Notes (Unsigned)
 Established patient visit   Patient: Alexis Lloyd   DOB: 1961/04/04   63 y.o. Female  MRN: [id number] Visit Date: 08/03/2023  Today's healthcare provider: Sherlyn Hay, DO   No chief complaint on file.  Subjective    HPI Salty taste around lips causes:   dehydration, dry mouth, post-nasal drip from a cold or allergies, oral infections, bleeding gums, certain medications, nutritional deficiencies (like zinc), acid reflux (GERD), and sometimes even a leak of cerebrospinal fluid due to head trauma     Pt has a vaginal itch that started on Friday. No other vaginal issues.  Pt reports that everything tastes salty and she has a salty taste in her mouth. This has been ongoing for a long time. Pt also reports that she has lost some weight and her BP readings are lower. She thinks she may need an adjustment to her HTN medication. 112/62  at last visit 07/16/23 ***  {History (Optional):23778}  Medications: Outpatient Medications Prior to Visit  Medication Sig   albuterol (VENTOLIN HFA) 108 (90 Base) MCG/ACT inhaler Inhale 2 puffs into the lungs every 6 (six) hours as needed.   aspirin EC 81 MG tablet Take 1 tablet (81 mg total) by mouth daily. Swallow whole.   Blood Glucose Monitoring Suppl (ONETOUCH VERIO) w/Device KIT To check blood sugar once daily   Continuous Glucose Receiver (FREESTYLE LIBRE 2 READER) DEVI 1 each by Does not apply route as directed.   Continuous Glucose Sensor (FREESTYLE LIBRE 2 SENSOR) MISC 1 each by Does not apply route every 14 (fourteen) days.   dapagliflozin propanediol (FARXIGA) 10 MG TABS tablet Take 1 tablet (10 mg total) by mouth daily before breakfast.   fluconazole (DIFLUCAN) 150 MG tablet Take 1 tablet PO for vaginal yeast; repeat dose in 4 days for complete treatment.   glipiZIDE (GLUCOTROL) 10 MG tablet Take 1 tablet (10 mg total) by mouth 2 (two) times daily before a meal.   glucose blood (ONETOUCH VERIO) test strip To check blood sugar once  daily   hydrocortisone cream 1 % Apply 1 Application topically 2 (two) times daily. External vaginal application   ibuprofen (ADVIL) 200 MG tablet Take 400 mg by mouth every 6 (six) hours as needed.   insulin degludec (TRESIBA FLEXTOUCH) 100 UNIT/ML FlexTouch Pen Inject 26 Units into the skin at bedtime.   Insulin Pen Needle (PEN NEEDLES) 32G X 6 MM MISC 1 each by Does not apply route at bedtime.   lactulose (CHRONULAC) 10 GM/15ML solution Take 15 mLs (10 g total) by mouth 3 (three) times daily.   lisinopril-hydrochlorothiazide (ZESTORETIC) 20-12.5 MG tablet Take 1 tablet by mouth daily.   montelukast (SINGULAIR) 10 MG tablet Take 1 tablet (10 mg total) by mouth daily.   OneTouch Delica Lancets 30G MISC 1 each by Does not apply route as directed.   pantoprazole (PROTONIX) 40 MG tablet Take 1 tablet (40 mg total) by mouth daily.   phenazopyridine (PYRIDIUM) 100 MG tablet Take 1 tablet (100 mg total) by mouth 3 (three) times daily as needed for pain.   rosuvastatin (CRESTOR) 10 MG tablet Take 1 tablet by mouth once daily   spironolactone (ALDACTONE) 25 MG tablet Take 1 tablet by mouth once daily   No facility-administered medications prior to visit.    Review of Systems ***  {Insert previous labs (optional):23779} {See past labs  Heme  Chem  Endocrine  Serology  Results Review (optional):1}   Objective    There  were no vitals taken for this visit. {Insert last BP/Wt (optional):23777}{See vitals history (optional):1}   Physical Exam   No results found for any visits on 08/03/23.  Assessment & Plan    There are no diagnoses linked to this encounter.  ***  No follow-ups on file.      I discussed the assessment and treatment plan with the patient  The patient was provided an opportunity to ask questions and all were answered. The patient agreed with the plan and demonstrated an understanding of the instructions.   The patient was advised to call back or seek an in-person  evaluation if the symptoms worsen or if the condition fails to improve as anticipated.    Sherlyn Hay, DO  [facility] [phone] (phone) [phone] (fax)  [facility]

## 2023-08-03 NOTE — Telephone Encounter (Signed)
  Chief Complaint: Vaginal itching, salty taste in mouth, Lower BP readings Symptoms: itch, salty taste Frequency: itch since Friday, salty taste for a long time Pertinent Negatives: Patient denies facial swelling, vaginal discharge Disposition: [] ED /[] Urgent Care (no appt availability in office) / [x] Appointment(In office/virtual)/ []  [place] [facility]/ [] Home Care/ [] Refused Recommended Disposition /[] [place] [facility]/ []  Follow-up with PCP Additional Notes: Pt has a vaginal itch that started on Friday. No other vaginal issues.  Pt reports that everything tastes salty and she has a salty taste in her mouth. This has been ongoing for a long time. Pt also reports that she has lost some weight and her BP readings are lower. She thinks she may need an adjustment to her HTN medication.    Copied from CRM [id number]. Topic: Clinical - Red Word Triage >> Aug 03, 2023  9:49 AM Elle L wrote: Red Word that prompted transfer to Nurse Triage: The patient was prescribed an antibiotic for a UTI but may be having an allergic reaction. She states she is itching around her mouth and may have a yeast infection. Reason for Disposition  MODERATE-SEVERE itching (i.e., interferes with school, work, or sleep)  Answer Assessment - Initial Assessment Questions 1. SYMPTOM: "What's the main symptom you're concerned about?" (e.g., pain, itching, dryness)     Itchy 2. LOCATION: "Where is the  outside located?" (e.g., inside/outside, left/right)     Vaginal area 3. ONSET: "When did the  itchiness  start?"     Friday 4. PAIN: "Is there any pain?" If Yes, ask: "How bad is it?" (Scale: 1-10; mild, moderate, severe)   -  MILD (1-3): Doesn't interfere with normal activities.    -  MODERATE (4-7): Interferes with normal activities (e.g., work or school) or awakens from sleep.     -  SEVERE (8-10): Excruciating pain, unable to do any normal activities.     none 5. ITCHING: "Is there any itching?" If Yes, ask:  "How bad is it?" (Scale: 1-10; mild, moderate, severe)     10/10 6. CAUSE: "What do you think is causing the discharge?" "Have you had the same problem before? What happened then?"     No discharge 7. OTHER SYMPTOMS: "Do you have any other symptoms?" (e.g., fever, itching, vaginal bleeding, pain with urination, injury to genital area, vaginal foreign body)     Salty taste in mouth, BP is going lower  Protocols used: Vaginal Symptoms-A-AH

## 2023-08-04 ENCOUNTER — Telehealth: Payer: Self-pay

## 2023-08-04 ENCOUNTER — Encounter: Payer: Self-pay | Admitting: Family Medicine

## 2023-08-04 DIAGNOSIS — R432 Parageusia: Secondary | ICD-10-CM | POA: Insufficient documentation

## 2023-08-04 HISTORY — DX: Parageusia: R43.2

## 2023-08-04 MED ORDER — PANTOPRAZOLE SODIUM 40 MG PO TBEC: 40.0000 mg | DELAYED_RELEASE_TABLET | Freq: Every day | ORAL | 1 refills | Status: DC

## 2023-08-04 NOTE — Assessment & Plan Note (Addendum)
 Persistent salty taste around lips for several months, present without licking. Drinking water consistently, making dehydration less likely. Possible vitamin/mineral deficiency, though unusual for localized symptom. Lips are chapped; uses medicated lip balm. Symptom worsened after increasing Ozempic dose to 1 mg, but was present before. No associated dizziness or lightheadedness. No vitamins or supplements taken. - Order zinc, vitamin B12, vitamin B3, and vitamin B6 levels

## 2023-08-04 NOTE — Telephone Encounter (Signed)
 Copied from CRM [id number]. Topic: Clinical - Lab/Test Results >> Aug 04, 2023 10:33 AM Ivette P wrote: Reason for CRM: Pt called in to follow up on lab results from 02/24. Pt would like a call explaining results.

## 2023-08-04 NOTE — Assessment & Plan Note (Signed)
 Well-controlled GERD managed with pantoprazole. Running low on medication; upcoming appointment with gastroenterologist on May 5th. - Refill pantoprazole prescription - Follow up with gastroenterologist on May 5th

## 2023-08-04 NOTE — Assessment & Plan Note (Signed)
 Reports vaginal itching, suspects yeast or bacterial infection. History of frequent infections, likely exacerbated by diabetes medications, including [place]. No concerns about sexually transmitted infections. Comfortable performing self-swab. - Perform vaginal swab for yeast and bacterial infections - Discuss potential medication adjustments with endocrinologist

## 2023-08-04 NOTE — Assessment & Plan Note (Signed)
 Type 2 Diabetes Mellitus On multiple medications including Farxiga, Ozempic, and glipizide. Reports frequent hypoglycemic episodes, particularly in early afternoon, suggesting regimen adjustment needed. Frequent yeast infections possibly related to [place]. Discussed potential switch to Ozempic and discontinuation of glipizide due to hypoglycemia. Prefers to discuss changes with endocrinologist. - Discuss potential discontinuation of glipizide with endocrinologist - Consider switching from Farxiga to another medication if yeast infections persist  Hypertension Blood pressure well-controlled at this time. Conitnue lisinopril-hydrochlorothiazide 20-12.5 mg daily and spironolactone 25 mg daily.

## 2023-08-04 NOTE — Telephone Encounter (Signed)
 Reason for CRM: Pt called in because she was in the office on 02/24 and was told to pick up prescriptions. Pt went to pharmacy and the prescriptions were not ordered. Pt would like a callback to see if prescriptions will be ordered.    Orders: -     Zinc -     Vitamin B12 -     Vitamin B3 -     Vitamin B6 -     Folate   Pt callbcak [phone]

## 2023-08-05 LAB — CERVICOVAGINAL ANCILLARY ONLY
Bacterial Vaginitis (gardnerella): NEGATIVE
Candida Glabrata: POSITIVE — AB
Candida Vaginitis: POSITIVE — AB
Chlamydia: NEGATIVE
Comment: NEGATIVE
Comment: NEGATIVE
Comment: NEGATIVE
Comment: NEGATIVE
Comment: NEGATIVE
Comment: NORMAL
Neisseria Gonorrhea: NEGATIVE
Trichomonas: NEGATIVE

## 2023-08-06 ENCOUNTER — Ambulatory Visit: Payer: Self-pay | Admitting: Family Medicine

## 2023-08-06 ENCOUNTER — Telehealth: Payer: Self-pay

## 2023-08-06 MED ORDER — TERCONAZOLE 0.8 % VA CREA: 1.0000 | TOPICAL_CREAM | Freq: Every day | VAGINAL | 0 refills | Status: DC

## 2023-08-06 NOTE — Telephone Encounter (Signed)
 Patient called, left VM to return the call to the office to speak to the NT.     Summary: worsening itch   Copied From CRM [id number]. Reason for Triage: The patient was seen on 2/24 for itchiness that has gotten worse. I advised the patient that we have sent a request for a prescription to the ordering provider per her chart notes and that her lab results have not returned. However, she states that the symptoms are bothering her and she is not sure what to do and she requested to speak to a nurse. Her call back number is [phone].

## 2023-08-06 NOTE — Telephone Encounter (Signed)
  Chief Complaint: vaginal itching Symptoms: itching to inside and outside Frequency: constant Pertinent Negatives: Patient denies NA Disposition: [] ED /[] Urgent Care (no appt availability in office) / [] Appointment(In office/virtual)/ []  [place] [facility]/ [] Home Care/ [] Refused Recommended Disposition /[] [place] [facility]/ [x]  Follow-up with PCP Additional Notes: Was seen on Monday and Rx was not called in to the pharmacy.  Patient calling to check on status; pcp office updated;  Reason for Disposition  [1] Symptoms of a yeast infection (i.e., itchy, white discharge, not bad smelling) AND [2] not improved > 3 days following Care Advice  Answer Assessment - Initial Assessment Questions 1. SYMPTOM: "What's the main symptom you're concerned about?" (e.g., pain, itching, dryness)     Itching just started was at dr on monday 2. LOCATION: "Where is the  itching located?" (e.g., inside/outside, left/right)     Both inside and outside 3. ONSET: "When did the  itching  start?"     today 4. PAIN: "Is there any pain?" If Yes, ask: "How bad is it?" (Scale: 1-10; mild, moderate, severe)   -  MILD (1-3): Doesn't interfere with normal activities.    -  MODERATE (4-7): Interferes with normal activities (e.g., work or school) or awakens from sleep.     -  SEVERE (8-10): Excruciating pain, unable to do any normal activities.     denies 5. ITCHING: "Is there any itching?" If Yes, ask: "How bad is it?" (Scale: 1-10; mild, moderate, severe)     7/10 6. CAUSE: "What do you think is causing the discharge?" "Have you had the same problem before? What happened then?"     Yes, yeast infection 7. OTHER SYMPTOMS: "Do you have any other symptoms?" (e.g., fever, itching, vaginal bleeding, pain with urination, injury to genital area, vaginal foreign body)     denies 8. PREGNANCY: "Is there any chance you are pregnant?" "When was your last menstrual period?"     na  Protocols used: Vaginal  Symptoms-A-AH

## 2023-08-06 NOTE — Telephone Encounter (Signed)
 Copied from CRM [id number]. Topic: General - Other >> Aug 06, 2023  8:39 AM Antwanette L wrote: Reason for CRM: Patient is requesting a pill and yeast infection cream. Please send to the medicine to the patient preferred pharmacy. Listed below is the patient pharmacy.  [facility] [address] [place], [place] [address] Phone: [phone] Fax: [phone]

## 2023-08-06 NOTE — Addendum Note (Signed)
 Addended by: Jacquenette Shone on: 08/06/2023 05:10 PM   Modules accepted: Orders

## 2023-08-07 NOTE — Telephone Encounter (Signed)
 Sent terconazole yesterday (08/06/23)

## 2023-08-10 ENCOUNTER — Other Ambulatory Visit: Payer: Self-pay | Admitting: Family Medicine

## 2023-08-10 DIAGNOSIS — E1165 Type 2 diabetes mellitus with hyperglycemia: Secondary | ICD-10-CM

## 2023-08-10 NOTE — Telephone Encounter (Signed)
 Per Dr Roxan Hockey a referral has been placed

## 2023-08-10 NOTE — Telephone Encounter (Signed)
 Patient was given provider's message. I went through lab results and advised some are still pending. Patient verbalized understanding. Patient also reports that Marcelline Deist is really expensive and she has run out of medication. We do not have samples in the office. She would like to have something for the diabetes.

## 2023-08-10 NOTE — Progress Notes (Unsigned)
 Ref #2

## 2023-08-10 NOTE — Telephone Encounter (Signed)
 Referred to [facility] pharmacy for assistance with cost of farxiga for DM management

## 2023-08-11 LAB — ZINC: Zinc: 65 ug/dL (ref 44–115)

## 2023-08-11 LAB — FOLATE: Folate: 11.7 ng/mL (ref >3.0)

## 2023-08-11 LAB — VITAMIN B3
Nicotinamide: 8.3 ng/mL (ref 5.2–72.1)
Nicotinic Acid: <5 ng/mL (ref 0.0–5.0)

## 2023-08-11 LAB — VITAMIN B6: Vitamin B6: 11.8 ug/L (ref 3.4–65.2)

## 2023-08-11 LAB — VITAMIN B12: Vitamin B-12: 356 pg/mL (ref 232–1245)

## 2023-08-14 ENCOUNTER — Telehealth: Payer: Self-pay

## 2023-08-14 NOTE — Progress Notes (Signed)
 Care Guide Pharmacy Note  08/14/2023 Name: Alexis Lloyd MRN: [id number] DOB: [birth year]  Referred By: Ronnald Ramp, MD Reason for referral: Care Coordination (Outreach to schedule with Pharm d )   Alexis Lloyd is a 63 y.o. year old female who is a primary care patient of Simmons-Robinson, Tawanna Cooler, MD.  [name] was referred to the pharmacist for assistance related to: DMII  An unsuccessful telephone outreach was attempted today to contact the patient who was referred to the pharmacy team for assistance with medication assistance. Additional attempts will be made to contact the patient.  Penne Lash , RMA     [facility]  [facility], [facility] Guide  Direct Dial: [phone]  Website: [email or web address]

## 2023-08-14 NOTE — Progress Notes (Signed)
 Care Guide Pharmacy Note  08/14/2023 Name: [name] MRN: [id number] DOB: 12/19/1960  Referred By: Ronnald Ramp, MD Reason for referral: Care Coordination (Outreach to schedule with Pharm d )   Alexis Lloyd is a 63 y.o. year old female who is a primary care patient of Simmons-Robinson, Tawanna Cooler, MD.  [name] was referred to the pharmacist for assistance related to: DMII  Successful contact was made with the patient to discuss pharmacy services including being ready for the pharmacist to call at least 5 minutes before the scheduled appointment time and to have medication bottles and any blood pressure readings ready for review. The patient agreed to meet with the pharmacist via telephone visit on (date/time).08/25/2023  Penne Lash , RMA     [place]  [facility], [facility] Guide  Direct Dial: [phone]  Website: [email or web address]

## 2023-08-25 ENCOUNTER — Other Ambulatory Visit: Payer: Self-pay | Admitting: Pharmacist

## 2023-08-25 NOTE — Progress Notes (Unsigned)
   08/25/2023  Patient ID: Alexis Lloyd, female   DOB: [birth year], 63 y.o.   MRN: [id number]  Called and spoke with the patient on the phone today regarding PAP for Ozempic and [place]. She reports having 1 dose of Ozempic left and has been out of [place] for a few weeks. Said BG was high this morning. Reports providing income information about 2 weeks ago which should have been faxed to the PAP companies.   Called [employer] for [place]. Reports they have NOT received any financial proof for appeal, but she was denied due to being above the income cut-off when reviewing with electronic record system. Would just need a tax document proving income.  Patient will also need samples of Farxiga and Jardiance to hold her over until applications are approved.   Also requested any refills that may be due on medications as Dr. Neita Garnet is now her new PCP.   Tried calling [facility] and got passed around 5 different times to the same phone number, which states to "call back during office hours" which is during the timeframe in which I called. Will try again later today regarding samples and asking if financial proof was sent to PAP. If not, will need to get samples at [facility] office and have patient bring in financial proof again for submission. Spoke to them on 08/27/23 in the morning finally.   Update from 08/27/23:  - Applications for [employer], Tresiba, and needles faxed to [employer] today along with proof of income - Air traffic [employer] for [employer] faxed to [employer] today - Submitted proof of income and applications 3 times to [facility] since November and still not approved; requested PCP office takes over going forward - Received Ozempic 0.25mg  sample in the office- advised to only do only 0.25mg  weekly until shipment arrives at office to keep medication in her system until then - Will not have any Farxiga until shipment arrives; refused office Synjardy due to component of Metformin and  [facility] does NOT have samples  *Task set to follow-up with patient on 09/11/23- giving PAP about 2 weeks for approval   Ricka Burdock, PharmD [facility]  Phone Number: [phone]

## 2023-08-26 ENCOUNTER — Ambulatory Visit: Payer: Self-pay | Admitting: Family Medicine

## 2023-08-26 NOTE — Telephone Encounter (Signed)
 Summary: Blood Sugar 241   Copied From CRM [id number]. Reason for Triage: The patient has not been on [place] for over a week and her Ozempic is about to run out and her blood sugar is high at 241 and is continuing to go up. The patient spoke to the Pharmacist yesterday who was submitting an application for financial assistance and looking for samples for the patient. However, she is concerned with what to do while she waits. The patient's call back number is [phone].

## 2023-08-26 NOTE — Telephone Encounter (Signed)
  Chief Complaint: Medication Symptoms: out of medication Frequency: one week Pertinent Negatives: Patient denies all symptoms Disposition: [] ED /[] Urgent Care (no appt availability in office) / [] Appointment(In office/virtual)/ []  [place] [facility]/ [x] Home Care/ [x] Refused Recommended Disposition /[] [place] [facility]/ [x]  Follow-up with PCP Additional Notes:   Yesterday virtual visit with pharmacist regarding Ozempic and [place]. Application for financial assistance filed before Thanksgiving, she is frustrated and states she has given all her financial information but they are saying she has not.  Per patient the Pharmacist plan is to check for samples and return her call but she has not heard back from pharmacist. Blood sugar is 249, now down to 204. Asymptomatic. Without Farxiga for one week, has one dose of Ozempic. Frustrated she has been waiting for months for medication approval. Requesting call back from clinical team.  Reason for Disposition  Blood glucose 70-240 mg/dL (3.9 -60.4 mmol/L)  Protocols used: Diabetes - High Blood Sugar-A-AH

## 2023-08-26 NOTE — Telephone Encounter (Signed)
 Please see the message below and advise, per Candise Bowens she has also been advised to reach back out to the pharmacist to get updates

## 2023-08-27 ENCOUNTER — Ambulatory Visit: Payer: Self-pay

## 2023-08-27 ENCOUNTER — Telehealth: Payer: Self-pay

## 2023-08-27 NOTE — Telephone Encounter (Signed)
 FYI Called and spoke to the pt, she stated she was going to drink a ginger ale and eat some jello, then recheck her blood sugar later, she has been advised to reach out to the office if she had any other concerns

## 2023-08-27 NOTE — Telephone Encounter (Signed)
 1 ozempic .25mg . Pen given to husband per Raven's request.

## 2023-08-27 NOTE — Telephone Encounter (Signed)
 Pt refused triage relating to high blood sugar. This was second attempt to reach pt.  Pt stated, "its too late. I talked to pharmacist and I'm going to take care of myself." RN advised pt to call back with any concerns. Pt verbalized "ok."          Answer Assessment - Initial Assessment Questions Pt refused triage.  Protocols used: Diabetes - High Blood Sugar-A-AH

## 2023-08-27 NOTE — Telephone Encounter (Signed)
 Copied from CRM [id number]. Topic: Clinical - Prescription Issue >> Aug 27, 2023  9:56 AM Alexis Lloyd wrote: Reason for CRM: Need to speak with Raven about her medications, blood sugar running high, no answer at [facility]- please call patient [phone]

## 2023-09-03 ENCOUNTER — Telehealth: Payer: Self-pay | Admitting: Pharmacist

## 2023-09-03 DIAGNOSIS — E1165 Type 2 diabetes mellitus with hyperglycemia: Secondary | ICD-10-CM

## 2023-09-03 MED ORDER — SEMAGLUTIDE (1 MG/DOSE) 4 MG/3ML ~~LOC~~ SOPN
1.0000 mg | PEN_INJECTOR | SUBCUTANEOUS | 0 refills | Status: DC
Start: 2023-09-03 — End: 2023-10-14

## 2023-09-03 NOTE — Progress Notes (Signed)
   09/03/2023  Patient ID: [name], female   DOB: August 08, 1960, 63 y.o.   MRN: [id number]  [employer] regarding Ozempic shipment. Since it was just approved, shipment has not been sent yet. Received voucher for now.  Ozempic 1mg  30DS voucher: ID: [id number] BIN: [id number] Grp: [id number] PCN: [id number]  [facility] to [facility] and will get voucher set-up later today. Processed with no issues.  Called patient and advised the Ozempic 1mg  will be ready at the pharmacy later today. Any following shipments should arrive at the office.   Will need to submit attestation form to [employer] for approval since disability form alone was not enough. Confirmed understanding.   Ricka Burdock, PharmD [facility]  Phone Number: [phone]

## 2023-09-07 NOTE — Progress Notes (Unsigned)
 Established patient visit   Patient: Alexis Lloyd   DOB: 1961/04/23   63 y.o. Female  MRN: [id number] Visit Date: 09/08/2023  Today's healthcare provider: Ronnald Ramp, MD   No chief complaint on file.  Subjective       Discussed the use of AI scribe software for clinical note transcription with the patient, who gave verbal consent to proceed.  History of Present Illness      Past Medical History:  Diagnosis Date   Allergy    Arthritis    Breast cancer (HCC)    Breast cancer, left breast (HCC) 10/04/2014   Diabetes mellitus without complication (HCC)    GERD (gastroesophageal reflux disease)    Hyperlipidemia    Hypertension    Osteoporosis    Sleep apnea     Medications: Outpatient Medications Prior to Visit  Medication Sig   albuterol (VENTOLIN HFA) 108 (90 Base) MCG/ACT inhaler Inhale 2 puffs into the lungs every 6 (six) hours as needed.   aspirin EC 81 MG tablet Take 1 tablet (81 mg total) by mouth daily. Swallow whole.   Blood Glucose Monitoring Suppl (ONETOUCH VERIO) w/Device KIT To check blood sugar once daily   Continuous Glucose Receiver (FREESTYLE LIBRE 2 READER) DEVI 1 each by Does not apply route as directed.   Continuous Glucose Sensor (FREESTYLE LIBRE 3 PLUS SENSOR) MISC by Does not apply route. Change sensor every 15 days.   dapagliflozin propanediol (FARXIGA) 10 MG TABS tablet Take 1 tablet (10 mg total) by mouth daily before breakfast.   glipiZIDE (GLUCOTROL) 10 MG tablet Take 1 tablet (10 mg total) by mouth 2 (two) times daily before a meal.   glucose blood (ONETOUCH VERIO) test strip To check blood sugar once daily   hydrocortisone cream 1 % Apply 1 Application topically 2 (two) times daily. External vaginal application (Patient not taking: Reported on 08/03/2023)   ibuprofen (ADVIL) 200 MG tablet Take 400 mg by mouth every 6 (six) hours as needed.   insulin degludec (TRESIBA FLEXTOUCH) 100 UNIT/ML FlexTouch Pen Inject 26 Units  into the skin at bedtime.   Insulin Pen Needle (PEN NEEDLES) 32G X 6 MM MISC 1 each by Does not apply route at bedtime.   lactulose (CHRONULAC) 10 GM/15ML solution Take 15 mLs (10 g total) by mouth 3 (three) times daily.   lisinopril-hydrochlorothiazide (ZESTORETIC) 20-12.5 MG tablet Take 1 tablet by mouth daily.   montelukast (SINGULAIR) 10 MG tablet Take 1 tablet (10 mg total) by mouth daily.   OneTouch Delica Lancets 30G MISC 1 each by Does not apply route as directed.   pantoprazole (PROTONIX) 40 MG tablet Take 1 tablet (40 mg total) by mouth daily.   rosuvastatin (CRESTOR) 10 MG tablet Take 1 tablet by mouth once daily   Semaglutide, 1 MG/DOSE, 4 MG/3ML SOPN Inject 1 mg into the skin once a week.   spironolactone (ALDACTONE) 25 MG tablet Take 1 tablet by mouth once daily   terconazole (TERAZOL 3) 0.8 % vaginal cream Place 1 applicator vaginally at bedtime.   No facility-administered medications prior to visit.    Review of Systems  {Insert previous labs (optional):23779} {See past labs  Heme  Chem  Endocrine  Serology  Results Review (optional):1}   Objective    There were no vitals taken for this visit. {Insert last BP/Wt (optional):23777}{See vitals history (optional):1}    Physical Exam  ***  No results found for any visits on 09/08/23.  Assessment & Plan  Problem List Items Addressed This Visit   None   Assessment and Plan Assessment & Plan      No follow-ups on file.         Ronnald Ramp, MD  [facility] [phone] (phone) [phone] (fax)  [facility]

## 2023-09-08 ENCOUNTER — Ambulatory Visit
Admission: RE | Admit: 2023-09-08 | Discharge: 2023-09-08 | Disposition: A | Discharge status: Home / Self Care | Source: Ambulatory Visit | Attending: Family Medicine | Admitting: Family Medicine

## 2023-09-08 ENCOUNTER — Encounter: Payer: Self-pay | Admitting: Family Medicine

## 2023-09-08 ENCOUNTER — Ambulatory Visit (INDEPENDENT_AMBULATORY_CARE_PROVIDER_SITE_OTHER): Admitting: Family Medicine

## 2023-09-08 ENCOUNTER — Ambulatory Visit
Admission: RE | Admit: 2023-09-08 | Discharge: 2023-09-08 | Disposition: A | Discharge status: Home / Self Care | Attending: Family Medicine | Admitting: Family Medicine

## 2023-09-08 VITALS — BP 114/48 | HR 80 | Resp 16 | Wt 218.3 lb

## 2023-09-08 DIAGNOSIS — I152 Hypertension secondary to endocrine disorders: Secondary | ICD-10-CM | POA: Diagnosis not present

## 2023-09-08 DIAGNOSIS — R042 Hemoptysis: Secondary | ICD-10-CM | POA: Diagnosis present

## 2023-09-08 DIAGNOSIS — L989 Disorder of the skin and subcutaneous tissue, unspecified: Secondary | ICD-10-CM

## 2023-09-08 DIAGNOSIS — E1169 Type 2 diabetes mellitus with other specified complication: Secondary | ICD-10-CM | POA: Diagnosis not present

## 2023-09-08 DIAGNOSIS — E1159 Type 2 diabetes mellitus with other circulatory complications: Secondary | ICD-10-CM

## 2023-09-08 DIAGNOSIS — E1143 Type 2 diabetes mellitus with diabetic autonomic (poly)neuropathy: Secondary | ICD-10-CM

## 2023-09-08 DIAGNOSIS — E785 Hyperlipidemia, unspecified: Secondary | ICD-10-CM

## 2023-09-08 DIAGNOSIS — E1165 Type 2 diabetes mellitus with hyperglycemia: Secondary | ICD-10-CM | POA: Diagnosis not present

## 2023-09-08 DIAGNOSIS — Z794 Long term (current) use of insulin: Secondary | ICD-10-CM

## 2023-09-08 DIAGNOSIS — R1012 Left upper quadrant pain: Secondary | ICD-10-CM

## 2023-09-08 DIAGNOSIS — N898 Other specified noninflammatory disorders of vagina: Secondary | ICD-10-CM

## 2023-09-08 MED ORDER — GLIPIZIDE 10 MG PO TABS: 10.0000 mg | ORAL_TABLET | Freq: Every day | ORAL | Status: DC

## 2023-09-08 MED ORDER — HYDROCORTISONE 1 % EX CREA: 1.0000 | TOPICAL_CREAM | Freq: Two times a day (BID) | CUTANEOUS | 0 refills | Status: AC

## 2023-09-08 NOTE — Patient Instructions (Signed)
 VISIT SUMMARY:  You had a follow-up appointment today after your recent visit to the emergency department for abdominal pain. We discussed your current symptoms, including coughing up blood-tinged phlegm, your diabetes management, blood pressure, and your known hiatal hernia. We also reviewed your general health maintenance and upcoming specialist appointments.  YOUR PLAN:  -HEMOPTYSIS: Hemoptysis means coughing up blood. You have been experiencing blood-tinged phlegm in the mornings for the past two days. We will order a chest x-ray to check for any lung issues like pneumonia. If the x-ray is normal, we may treat you for an upper respiratory infection.  -DIABETES MELLITUS TYPE 2: Diabetes Mellitus Type 2 is a condition where your body has trouble regulating blood sugar levels. Your current medications include Ozempic, Tresiba, and glipizide. Your blood sugar levels have been high in the mornings and low in the afternoons. We will continue your current medications and hold spironolactone to see if it affects your blood pressure and overall well-being. Please monitor your blood sugar levels closely, especially in the afternoon. We will discuss any medication adjustments with your endocrinologist at your upcoming appointment.  -HYPERTENSION: Hypertension is high blood pressure. Your blood pressure has been on the lower side recently, and you have been feeling tired. We will hold spironolactone to see if it improves your symptoms and continue lisinopril with hydrochlorothiazide.  -HIATAL HERNIA: A hiatal hernia occurs when part of your stomach pushes up through your diaphragm. You have a known hiatal hernia, which can cause discomfort, especially during episodes of diarrhea. Your abdominal symptoms have improved, but we will continue to monitor for any recurrence.  -GENERAL HEALTH MAINTENANCE: You are taking multiple supplements, including vitamin D3 and omega-3. You experienced diarrhea after starting  them. We recommend introducing vitamin D3 alone for two weeks, then adding other supplements one at a time every two weeks to identify any that may cause gastrointestinal upset.  INSTRUCTIONS:  Please follow up with your GI specialist on May 5th and with endocrinology at the end of May. We will also obtain the chest x-ray results and adjust your treatment plan as needed based on the findings.

## 2023-09-08 NOTE — Assessment & Plan Note (Signed)
 Chronic  Hypertension managed with lisinopril and spironolactone. Blood pressure readings low at 114/48 and she reports feeling tired, which may be related to medication effects. Lisinopril was initially prescribed for toxemia.   - Hold spironolactone 25mg  daily  to evaluate its impact on blood pressure and symptoms.   - Continue lisinopril 20mg   with hydrochlorothiazide 12.5mg  daily

## 2023-09-08 NOTE — Assessment & Plan Note (Signed)
 Chronic  Diabetes management is complicated by financial constraints affecting medication access. Current medications include Ozempic, Tresiba, and glipizide. Reports fasting blood glucose levels in the 200s, indicating suboptimal control. Afternoon hypoglycemia episodes noted, possibly related to glipizide. Experiencing stress and depression, which may impact diabetes management.   - Continue Ozempic and Evaristo Bury as prescribed.   - Hold spironolactone to assess impact on blood pressure and overall well-being.   - Monitor blood glucose levels closely, especially in the afternoon.   - Discuss potential medication adjustments with endocrinology at the upcoming appointment.

## 2023-09-09 LAB — H. PYLORI BREATH TEST: H pylori Breath Test: NEGATIVE

## 2023-09-11 ENCOUNTER — Other Ambulatory Visit: Payer: Self-pay | Admitting: Pharmacist

## 2023-09-11 NOTE — Progress Notes (Signed)
   09/11/2023  Patient ID: Alexis Lloyd, female   DOB: [birth year], 63 y.o.   MRN: [id number]  Called and spoke with the patient on the phone today.  Received Ozempic- injecting on Tuesdays. BG's still a little elevated but taking everything as prescribed.  Will re-send attestation form today to ensure [employer] received it. Plan to follow-up with patient again after restarting the medication.  Depression from medications being messed up currently.  Discussed imaging and H pylori test with the patient- both are negative. Reports no further issues with coughing up blood and abdominal pain.  Stopped Spironolactone as recommended and slowly restarting supplements as recommended. Reports doing okay for now.   Ricka Burdock, PharmD [facility]  Phone Number: [phone]

## 2023-09-22 ENCOUNTER — Telehealth: Payer: Self-pay | Admitting: Family Medicine

## 2023-09-22 MED ORDER — ROSUVASTATIN CALCIUM 10 MG PO TABS: 10.0000 mg | ORAL_TABLET | Freq: Every day | ORAL | 2 refills | Status: DC

## 2023-09-22 NOTE — Telephone Encounter (Signed)
 Informed patient that her Horace Lye [employer]ra Everts has arrived. Patient stated that she would pick it up on 09/23/2023.

## 2023-09-22 NOTE — Telephone Encounter (Signed)
 Patient wants a refill on for rosuvastatin (CRESTOR) 10 MG tablet [facility] [address]

## 2023-09-23 ENCOUNTER — Telehealth: Payer: Self-pay | Admitting: Pharmacist

## 2023-09-23 ENCOUNTER — Telehealth: Payer: Self-pay | Admitting: Family Medicine

## 2023-09-23 NOTE — Telephone Encounter (Signed)
 Picked tresiba, ozempic and novofine 32g tip needles up today and it was logged in the book as well on 09/23/23

## 2023-09-23 NOTE — Progress Notes (Signed)
   09/23/2023  Patient ID: Alexis Lloyd, female   DOB: 06/09/1961, 63 y.o.   MRN: [id number]  Called [employer] for Farxiga follow-up. Reports it was stuck in "Pending" status and needed a manager to fully approve it. At returned call it was approved and should arrive at patient's home in 14 business days at the latest. Approved until 06/08/24.    Delvin File, PharmD [facility]  Phone Number: [phone]

## 2023-09-29 ENCOUNTER — Telehealth: Payer: Self-pay

## 2023-09-29 NOTE — Telephone Encounter (Signed)
 Copied from CRM [id number]. Topic: Referral - Request for Referral >> Sep 29, 2023  2:37 PM Jorie Newness J wrote: Did the patient discuss referral with their provider in the last year? Yes (If No - schedule appointment) (If Yes - send message)  Appointment offered? No, pt states her pcp knows she has TED(?)  Type of order/referral and detailed reason for visit: The pt states she needs to see a TED doctor not a pcp  Preference of office, provider, location: [facility] or [facility]   If referral order, have you been seen by this specialty before? No (If Yes, this issue or another issue? When? Where?  Can we respond through MyChart? No

## 2023-09-30 NOTE — Telephone Encounter (Signed)
 Please call to clarify what type of referral patient is requesting.

## 2023-10-01 ENCOUNTER — Other Ambulatory Visit: Payer: Self-pay | Admitting: Family Medicine

## 2023-10-01 DIAGNOSIS — R051 Acute cough: Secondary | ICD-10-CM

## 2023-10-01 DIAGNOSIS — R0602 Shortness of breath: Secondary | ICD-10-CM

## 2023-10-01 DIAGNOSIS — J4520 Mild intermittent asthma, uncomplicated: Secondary | ICD-10-CM

## 2023-10-01 NOTE — Telephone Encounter (Signed)
 Copied from CRM [id number]. Topic: Clinical - Medication Refill >> Oct 01, 2023 10:19 AM Alexis Lloyd wrote: Most Recent Primary Care Visit:  Provider: SIMMONS-ROBINSON, MAKIERA  Department: [facility]  Visit Type: HOSPITAL FOLLOW UP  Date: 09/08/2023  Medication: albuterol  (VENTOLIN  HFA) 108 (90 Base) MCG/ACT inhaler  Has the patient contacted their pharmacy? Yes (Agent: If no, request that the patient contact the pharmacy for the refill. If patient does not wish to contact the pharmacy document the reason why and proceed with request.) (Agent: If yes, when and what did the pharmacy advise?) Pharmacy needs order to refill  Is this the correct pharmacy for this prescription? Yes If no, delete pharmacy and type the correct one.  This is the patient's preferred pharmacy:  [facility] [address]), [place] - [address] [place]uf[place] Cough) [place] [address] Phone: [phone] Fax: [phone]  Has the prescription been filled recently? No  Is the patient out of the medication? Yes - She has been out for a while.   Has the patient been seen for an appointment in the last year OR does the patient have an upcoming appointment? Yes  Can we respond through MyChart? No  Agent: Please be advised that Rx refills may take up to 3 business days. We ask that you follow-up with your pharmacy.

## 2023-10-02 ENCOUNTER — Encounter: Payer: Self-pay | Admitting: Physician Assistant

## 2023-10-02 ENCOUNTER — Ambulatory Visit (INDEPENDENT_AMBULATORY_CARE_PROVIDER_SITE_OTHER): Admitting: Physician Assistant

## 2023-10-02 VITALS — BP 167/65 | HR 73 | Temp 98.8°F | Resp 16 | Ht 59.0 in | Wt 224.1 lb

## 2023-10-02 DIAGNOSIS — J018 Other acute sinusitis: Secondary | ICD-10-CM | POA: Diagnosis not present

## 2023-10-02 DIAGNOSIS — E1165 Type 2 diabetes mellitus with hyperglycemia: Secondary | ICD-10-CM | POA: Diagnosis not present

## 2023-10-02 DIAGNOSIS — Z794 Long term (current) use of insulin: Secondary | ICD-10-CM

## 2023-10-02 DIAGNOSIS — E1169 Type 2 diabetes mellitus with other specified complication: Secondary | ICD-10-CM | POA: Diagnosis not present

## 2023-10-02 DIAGNOSIS — R051 Acute cough: Secondary | ICD-10-CM

## 2023-10-02 DIAGNOSIS — J4520 Mild intermittent asthma, uncomplicated: Secondary | ICD-10-CM

## 2023-10-02 DIAGNOSIS — R0602 Shortness of breath: Secondary | ICD-10-CM

## 2023-10-02 DIAGNOSIS — E785 Hyperlipidemia, unspecified: Secondary | ICD-10-CM

## 2023-10-02 MED ORDER — AZITHROMYCIN 250 MG PO TABS: ORAL_TABLET | ORAL | 0 refills | Status: AC

## 2023-10-02 MED ORDER — GLIPIZIDE 5 MG PO TABS: 5.0000 mg | ORAL_TABLET | Freq: Every day | ORAL | 3 refills | Status: DC

## 2023-10-02 MED ORDER — ROSUVASTATIN CALCIUM 10 MG PO TABS: 10.0000 mg | ORAL_TABLET | Freq: Every day | ORAL | 1 refills | Status: DC

## 2023-10-02 MED ORDER — ALBUTEROL SULFATE HFA 108 (90 BASE) MCG/ACT IN AERS: 2.0000 | INHALATION_SPRAY | Freq: Four times a day (QID) | RESPIRATORY_TRACT | 0 refills | Status: DC | PRN

## 2023-10-02 NOTE — Progress Notes (Signed)
 Established patient visit  Patient: Alexis Lloyd   DOB: [birth year]   63 y.o. Female  MRN: [id number] Visit Date: 10/02/2023  Today's healthcare provider: Blane Bunting, PA-C   Chief Complaint  Patient presents with   Dizziness    Dizziness and weak x5 days SOB the pt has not had bp meds for awhile.   Subjective     Discussed the use of AI scribe software for clinical note transcription with the patient, who gave verbal consent to proceed.  History of Present Illness The patient, with a history of hypertension, diabetes, and possible asthma or COPD, presents with multiple complaints. The chief complaint is dizziness, which started a couple of days ago. The patient also reports episodes of hypoglycemia, with blood sugar levels dropping to 55-65. The patient has been managing this by adjusting her diet and medication, but the frequency of these episodes is concerning.  The patient also reports symptoms of sinusitis, including nasal congestion and headaches. These symptoms have been present for about a week. The patient has a history of sinus problems and believes the current symptoms may be related to allergies.  In addition to these issues, the patient has been experiencing cysts on her legs and vagina. The patient is unsure why these cysts keep appearing and is seeking advice on how to manage them.  Finally, the patient reports feeling tired and experiencing shortness of breath, especially when walking. The patient uses an albuterol  inhaler as needed for these symptoms.       10/02/2023    9:58 AM 09/08/2023    1:48 PM 08/03/2023    1:31 PM  Depression screen PHQ 2/9  Decreased Interest 1 0 0  Down, Depressed, Hopeless 0 0 0  PHQ - 2 Score 1 0 0  Altered sleeping 1 0 0  Tired, decreased energy 2 0 0  Change in appetite 0 0 0  Feeling bad or failure about yourself  0 0 0  Trouble concentrating 0 0 0  Moving slowly or fidgety/restless 0 0 0  Suicidal thoughts 0 0 0  PHQ-9  Score 4 0 0  Difficult doing work/chores Somewhat difficult        10/02/2023    9:59 AM 08/03/2023    1:32 PM  GAD 7 : Generalized Anxiety Score  Nervous, Anxious, on Edge 0 0  Control/stop worrying 0 0  Worry too much - different things 0 0  Trouble relaxing 0 0  Restless 0 0  Easily annoyed or irritable 0 0  Afraid - awful might happen 0 0  Total GAD 7 Score 0 0  Anxiety Difficulty Not difficult at all     Medications: Outpatient Medications Prior to Visit  Medication Sig   albuterol  (VENTOLIN  HFA) 108 (90 Base) MCG/ACT inhaler Inhale 2 puffs into the lungs every 6 (six) hours as needed.   aspirin  EC 81 MG tablet Take 1 tablet (81 mg total) by mouth daily. Swallow whole.   Blood Glucose Monitoring Suppl (ONETOUCH VERIO) w/Device KIT To check blood sugar once daily   Continuous Glucose Receiver (FREESTYLE LIBRE 2 READER) DEVI 1 each by Does not apply route as directed.   Continuous Glucose Sensor (FREESTYLE LIBRE 3 PLUS SENSOR) MISC by Does not apply route. Change sensor every 15 days.   dapagliflozin  propanediol (FARXIGA ) 10 MG TABS tablet Take 1 tablet (10 mg total) by mouth daily before breakfast.   glipiZIDE  (GLUCOTROL ) 10 MG tablet Take 1 tablet (10 mg total) by mouth daily before  breakfast.   glucose blood (ONETOUCH VERIO) test strip To check blood sugar once daily   hydrocortisone  cream 1 % Apply 1 Application topically 2 (two) times daily. External vaginal application   ibuprofen  (ADVIL ) 200 MG tablet Take 400 mg by mouth every 6 (six) hours as needed.   insulin  degludec (TRESIBA  FLEXTOUCH) 100 UNIT/ML FlexTouch Pen Inject 26 Units into the skin at bedtime.   Insulin  Pen Needle (PEN NEEDLES) 32G X 6 MM MISC 1 each by Does not apply route at bedtime.   lactulose  (CHRONULAC ) 10 GM/15ML solution Take 15 mLs (10 g total) by mouth 3 (three) times daily. (Patient not taking: Reported on 09/11/2023)   lisinopril -hydrochlorothiazide  (ZESTORETIC ) 20-12.5 MG tablet Take 1 tablet by  mouth daily.   montelukast  (SINGULAIR ) 10 MG tablet Take 1 tablet (10 mg total) by mouth daily.   OneTouch Delica Lancets 30G MISC 1 each by Does not apply route as directed.   pantoprazole  (PROTONIX ) 40 MG tablet Take 1 tablet (40 mg total) by mouth daily.   rosuvastatin  (CRESTOR ) 10 MG tablet Take 1 tablet (10 mg total) by mouth daily.   Semaglutide , 1 MG/DOSE, 4 MG/3ML SOPN Inject 1 mg into the skin once a week.   No facility-administered medications prior to visit.    Review of Systems All negative Except see HPI       Objective    BP (!) 167/65 (BP Location: Right Arm, Patient Position: Sitting, Cuff Size: Large)   Pulse 73   Resp 16   Ht 4\' 11"  (1.499 m)   Wt 224 lb 1.6 oz (101.7 kg)   SpO2 100%   BMI 45.26 kg/m     Physical Exam Vitals reviewed.  Constitutional:      General: She is not in acute distress.    Appearance: Normal appearance. She is well-developed and normal weight. She is not diaphoretic.  HENT:     Head: Normocephalic and atraumatic.     Right Ear: Ear canal and external ear normal.     Left Ear: Ear canal and external ear normal.     Nose: Congestion and rhinorrhea present.     Mouth/Throat:     Pharynx: Posterior oropharyngeal erythema present.     Comments: Postnasal drainage noted Eyes:     General: No scleral icterus.       Right eye: No discharge.        Left eye: No discharge.     Extraocular Movements: Extraocular movements intact.     Conjunctiva/sclera: Conjunctivae normal.     Pupils: Pupils are equal, round, and reactive to light.  Neck:     Thyroid : No thyromegaly.  Cardiovascular:     Rate and Rhythm: Normal rate and regular rhythm.     Pulses: Normal pulses.     Heart sounds: Normal heart sounds. No murmur heard. Pulmonary:     Effort: Pulmonary effort is normal. No respiratory distress.     Breath sounds: Normal breath sounds. No wheezing, rhonchi or rales.  Abdominal:     General: Abdomen is flat. Bowel sounds are  normal.     Palpations: Abdomen is soft.  Musculoskeletal:     Cervical back: Neck supple.     Right lower leg: No edema.     Left lower leg: No edema.  Lymphadenopathy:     Cervical: No cervical adenopathy.  Skin:    General: Skin is warm and dry.     Findings: No rash.  Neurological:  Mental Status: She is alert and oriented to person, place, and time. Mental status is at baseline.  Psychiatric:        Mood and Affect: Mood normal.        Behavior: Behavior normal.      No results found for any visits on 10/02/23.      Assessment & Plan Hypertension Chronic and unstable  hypertension management complicated by recent medication changes. Dizziness and shortness of breath possibly related to blood pressure fluctuations. - Resume lisinopril  20 mg with hydrochlorothiazide  12.5 mg twice daily. - Hold spironolactone . - Advise to purchase a blood pressure cuff and monitor blood pressure at home. - Schedule follow-up with primary care provider within 2-4 weeks.  Type 2 Diabetes Mellitus with hypoglycemia Frequent hypoglycemia with blood glucose levels 55-65 mg/dL. Hypoglycemia may contribute to dizziness and fatigue. - Decrease glipizide  to 5 mg once daily.  Currently on Ozempic , Farxiga , insulin  - Monitor blood glucose levels closely. - Schedule follow-up with primary care provider within 2 weeks Sooner if symptoms persist.   Dizziness Dizziness potentially related to medication changes, hypoglycemia, and blood pressure fluctuations. - Hold rosuvastatin  until lab work is completed. - Decrease glipizide  to 5 mg once daily. - Monitor blood pressure and blood glucose closely.  Shortness of breath Shortness of breath may be related to sinusitis or other respiratory issues. - Prescribe albuterol  inhaler for use as needed. - Monitor for changes in respiratory symptoms.  Sinusitis Chronic sinusitis with recent exacerbation. Symptoms may be related to allergies. - Start  Zyrtec  daily in the morning. - Continue Singulair  at night. - Consider Z-Pak if symptoms do not improve by Monday. - Advise nasal saline rinse and gel for nasal congestion.  Hyperlipidemia Unable to afford rosuvastatin  due to insurance issues. Taking husband's 20 mg dose instead of prescribed 10 mg, leading to dizziness and muscle soreness. - Prescribe rosuvastatin  10 mg for 30 days. - Hold rosuvastatin  until lab work is completed. - Order liver function tests and other relevant labs. - Advise to check medication prices with [employer] if insurance issues persist.  Follow-up Multiple ongoing health issues require close monitoring and follow-up. - Schedule follow-up with primary care provider within 2-4 weeks. - Advise to contact ophthalmologist for eye examination and potential referral to retinal specialist.  Hyperlipidemia associated with type 2 diabetes mellitus (HCC) (Primary)  - rosuvastatin  (CRESTOR ) 10 MG tablet; Take 1 tablet (10 mg total) by mouth daily.  Dispense: 30 tablet; Refill: 1 - Comprehensive metabolic panel with GFR - CK (Creatine Kinase) - Lipid panel   Type 2 diabetes mellitus with hyperglycemia, without long-term current use of insulin  (HCC)  - glipiZIDE  (GLUCOTROL ) 5 MG tablet; Take 1 tablet (5 mg total) by mouth daily before breakfast.  Dispense: 30 tablet; Refill: 3  Acute non-recurrent sinusitis of other sinus - azithromycin  (ZITHROMAX ) 250 MG tablet; Take 2 tablets on day 1, then 1 tablet daily on days 2 through 5  Dispense: 6 tablet; Refill: 0 - CBC with Differential/Platelet  Acute cough - CBC with Differential/Platelet - albuterol  (VENTOLIN  HFA) 108 (90 Base) MCG/ACT inhaler; Inhale 2 puffs into the lungs every 6 (six) hours as needed.  Dispense: 18 g; Refill: 0 SOB (shortness of breath) - albuterol  (VENTOLIN  HFA) 108 (90 Base) MCG/ACT inhaler; Inhale 2 puffs into the lungs every 6 (six) hours as needed.  Dispense: 18 g; Refill: 0  Mild intermittent  asthma without complication - albuterol  (VENTOLIN  HFA) 108 (90 Base) MCG/ACT inhaler; Inhale 2 puffs into the  lungs every 6 (six) hours as needed.  Dispense: 18 g; Refill: 0  Cysts in groin area Advised to discuss during follow-up with pcp due to priority of her complaints and time restriction.  TED? specialist - advised to follow-up with ophthalmology for further assistance  No orders of the defined types were placed in this encounter.   No follow-ups on file.   The patient was advised to call back or seek an in-person evaluation if the symptoms worsen or if the condition fails to improve as anticipated.  I discussed the assessment and treatment plan with the patient. The patient was provided an opportunity to ask questions and all were answered. The patient agreed with the plan and demonstrated an understanding of the instructions.  I, Lynford Espinoza, PA-C have reviewed all documentation for this visit. The documentation on 10/02/2023  for the exam, diagnosis, procedures, and orders are all accurate and complete.  Blane Bunting, [facility], MMS [facility] [phone] (phone) [phone] (fax)  [facility]

## 2023-10-02 NOTE — Telephone Encounter (Signed)
 Requested Prescriptions  Pending Prescriptions Disp Refills   albuterol  (VENTOLIN  HFA) 108 (90 Base) MCG/ACT inhaler 18 g 0    Sig: Inhale 2 puffs into the lungs every 6 (six) hours as needed.     Pulmonology:  Beta Agonists 2 Failed - 10/02/2023 10:57 AM      Failed - Last BP in normal range    BP Readings from Last 1 Encounters:  10/02/23 (!) 167/65         Passed - Last Heart Rate in normal range    Pulse Readings from Last 1 Encounters:  10/02/23 73         Passed - Valid encounter within last 12 months    Recent Outpatient Visits           Today Hyperlipidemia associated with type 2 diabetes mellitus [facility])   [facility] [place], [place], PA-C   3 weeks ago Hypertension associated with diabetes [facility])   [place] [facility] Simmons-Robinson, [place], MD   2 months ago Vagina itching   [place] [facility] [place], Asencion Blacksmith, DO   2 months ago Hypertension associated with diabetes [facility])   [facility] Mimi Alt, MD

## 2023-10-03 LAB — CBC WITH DIFFERENTIAL/PLATELET
Basophils Absolute: 0 10*3/uL (ref 0.0–0.2)
Basos: 1 %
EOS (ABSOLUTE): 0.1 10*3/uL (ref 0.0–0.4)
Eos: 1 %
Hematocrit: 34.8 % (ref 34.0–46.6)
Hemoglobin: 11.5 g/dL (ref 11.1–15.9)
Immature Grans (Abs): 0 10*3/uL (ref 0.0–0.1)
Immature Granulocytes: 0 %
Lymphocytes Absolute: 1.9 10*3/uL (ref 0.7–3.1)
Lymphs: 26 %
MCH: 28.3 pg (ref 26.6–33.0)
MCHC: 33 g/dL (ref 31.5–35.7)
MCV: 86 fL (ref 79–97)
Monocytes Absolute: 0.5 10*3/uL (ref 0.1–0.9)
Monocytes: 6 %
Neutrophils Absolute: 4.9 10*3/uL (ref 1.4–7.0)
Neutrophils: 66 %
Platelets: 286 10*3/uL (ref 150–450)
RBC: 4.06 x10E6/uL (ref 3.77–5.28)
RDW: 13.1 % (ref 11.7–15.4)
WBC: 7.4 10*3/uL (ref 3.4–10.8)

## 2023-10-03 LAB — COMPREHENSIVE METABOLIC PANEL WITH GFR
ALT: 14 IU/L (ref 0–32)
AST: 8 IU/L (ref 0–40)
Albumin: 4 g/dL (ref 3.9–4.9)
Alkaline Phosphatase: 87 IU/L (ref 44–121)
BUN/Creatinine Ratio: 26 (ref 12–28)
BUN: 16 mg/dL (ref 8–27)
Bilirubin Total: 0.3 mg/dL (ref 0.0–1.2)
CO2: 24 mmol/L (ref 20–29)
Calcium: 9.2 mg/dL (ref 8.7–10.3)
Chloride: 103 mmol/L (ref 96–106)
Creatinine, Ser: 0.62 mg/dL (ref 0.57–1.00)
Globulin, Total: 2.5 g/dL (ref 1.5–4.5)
Glucose: 130 mg/dL — ABNORMAL HIGH (ref 70–99)
Potassium: 3.8 mmol/L (ref 3.5–5.2)
Sodium: 142 mmol/L (ref 134–144)
Total Protein: 6.5 g/dL (ref 6.0–8.5)
eGFR: 99 mL/min/{1.73_m2} (ref >59)

## 2023-10-03 LAB — CK: Total CK: 118 U/L (ref 32–182)

## 2023-10-03 LAB — LIPID PANEL
Chol/HDL Ratio: 5 ratio — ABNORMAL HIGH (ref 0.0–4.4)
Cholesterol, Total: 145 mg/dL (ref 100–199)
HDL: 29 mg/dL — ABNORMAL LOW (ref >39)
LDL Chol Calc (NIH): 90 mg/dL (ref 0–99)
Triglycerides: 148 mg/dL (ref 0–149)
VLDL Cholesterol Cal: 26 mg/dL (ref 5–40)

## 2023-10-08 NOTE — Progress Notes (Signed)
 Lab was stable for you except Elevated blood sugar but less than 7 months ago.  Please continue current regimen for diabetes and include low-carb diet / staying active as tolerated Low good cholesterol/HDL. CK WNL, please, resume rosuvastatin . Continue BP log and bring your BP device to your follow-up with Dr. Verdia Glad.

## 2023-10-09 ENCOUNTER — Telehealth: Payer: Self-pay

## 2023-10-09 ENCOUNTER — Telehealth: Payer: Self-pay | Admitting: Family Medicine

## 2023-10-09 DIAGNOSIS — J40 Bronchitis, not specified as acute or chronic: Secondary | ICD-10-CM

## 2023-10-09 MED ORDER — MONTELUKAST SODIUM 10 MG PO TABS: 10.0000 mg | ORAL_TABLET | Freq: Every day | ORAL | 1 refills | Status: DC

## 2023-10-09 NOTE — Telephone Encounter (Signed)
Rx has been sent to the pharmacy on file.  

## 2023-10-09 NOTE — Telephone Encounter (Signed)
[facility] faxed refill request for the following medications:  montelukast (SINGULAIR) 10 MG tablet    Please advise.  

## 2023-10-09 NOTE — Telephone Encounter (Signed)
 Alexis Lloyd

## 2023-10-14 ENCOUNTER — Ambulatory Visit (INDEPENDENT_AMBULATORY_CARE_PROVIDER_SITE_OTHER): Admitting: Family Medicine

## 2023-10-14 ENCOUNTER — Encounter: Payer: Self-pay | Admitting: Family Medicine

## 2023-10-14 VITALS — BP 138/57 | HR 67 | Ht 59.0 in | Wt 217.0 lb

## 2023-10-14 DIAGNOSIS — E1159 Type 2 diabetes mellitus with other circulatory complications: Secondary | ICD-10-CM | POA: Diagnosis not present

## 2023-10-14 DIAGNOSIS — E1165 Type 2 diabetes mellitus with hyperglycemia: Secondary | ICD-10-CM

## 2023-10-14 DIAGNOSIS — I152 Hypertension secondary to endocrine disorders: Secondary | ICD-10-CM

## 2023-10-14 DIAGNOSIS — Z794 Long term (current) use of insulin: Secondary | ICD-10-CM

## 2023-10-14 DIAGNOSIS — Z853 Personal history of malignant neoplasm of breast: Secondary | ICD-10-CM

## 2023-10-14 DIAGNOSIS — Z13820 Encounter for screening for osteoporosis: Secondary | ICD-10-CM | POA: Diagnosis not present

## 2023-10-14 DIAGNOSIS — E1142 Type 2 diabetes mellitus with diabetic polyneuropathy: Secondary | ICD-10-CM

## 2023-10-14 DIAGNOSIS — E1169 Type 2 diabetes mellitus with other specified complication: Secondary | ICD-10-CM

## 2023-10-14 DIAGNOSIS — E785 Hyperlipidemia, unspecified: Secondary | ICD-10-CM

## 2023-10-14 MED ORDER — SEMAGLUTIDE (2 MG/DOSE) 8 MG/3ML ~~LOC~~ SOPN
2.0000 mg | PEN_INJECTOR | SUBCUTANEOUS | Status: DC
Start: 2023-10-14 — End: 2024-04-20

## 2023-10-14 MED ORDER — ONETOUCH VERIO W/DEVICE KIT: PACK | 0 refills | Status: DC

## 2023-10-14 NOTE — Assessment & Plan Note (Signed)
 UTD on mammograms through [facility]

## 2023-10-14 NOTE — Assessment & Plan Note (Signed)
 Chronic Currently managed with Crestor  10 mg. Last LDL was 90 mg/dL. - Continue Crestor  10 mg daily

## 2023-10-14 NOTE — Assessment & Plan Note (Signed)
 Chronic, BMI 43.83 Recent weight loss of 7 pounds since April 25th. She expresses a desire to lose 50 pounds safely. - Encourage continued weight management efforts - continue ozempic  2mg  weekly

## 2023-10-14 NOTE — Assessment & Plan Note (Signed)
 Type 2 diabetes mellitus with hyperglycemia Currently managed with multiple medications. Recent adjustment of glipizide  to 5 mg due to episodes of hypoglycemia. Blood glucose levels have stabilized with morning readings around 148 mg/dL. Last A1c was 8.0% on February 6th. Weight has decreased by 7 pounds since April 25th. Endocrinology follow-up scheduled for May 28th. - Continue Farxiga  10 mg daily - Continue glipizide  5 mg daily - Continue Tresiba  26 units at bedtime - Continue Ozempic , increase to 2 mg weekly after current supply is finished - f/u with endocrinology as scheduled on 11/04/23 - Order new glucometer and supplies from [employer] - Order A1c test

## 2023-10-14 NOTE — Progress Notes (Signed)
 Established patient visit   Patient: Alexis Lloyd   DOB: [birth year]   63 y.o. Female  MRN: [id number] Visit Date: 10/14/2023  Today's healthcare provider: Mimi Alt, MD   Chief Complaint  Patient presents with   Hypertension   Diabetes   Subjective       Discussed the use of AI scribe software for clinical note transcription with the patient, who gave verbal consent to proceed.  History of Present Illness Alexis Lloyd is a 63 year old female with diabetes and hypertension who presents for follow-up.  She manages her diabetes with Ozempic , Tresiba , and glipizide . Her last A1c was 8.0 on February 6th. Blood sugars have stabilized, with morning readings around 148 mg/dL. She uses a [employer] 3+ for glucose monitoring and has requested a new glucometer due to battery issues. Current medications include Farxiga  10 mg daily, glipizide  5 mg daily, Tresiba  26 units at bedtime, and Ozempic  2 mg weekly, which was recently increased from 1 mg due to rising blood sugars and weight gain. She has lost 7 pounds since April 25th.  Regarding hypertension, she was previously on spironolactone , which was held due to concerns about its effect on her blood pressure. She is currently taking lisinopril  20 mg and hydrochlorothiazide  12.5 mg daily. No longer experiencing dizziness, which was previously attributed to an upper respiratory infection treated with antibiotics and Zyrtec . She continues to take Zyrtec  in the morning and Singulair  at night.  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  She reports a significant weight gain, which she attributes to her diabetes medications, but has recently lost 7 pounds. She wants to lose 50 pounds safely.  She completed a mammogram earlier this year at [facility] and requires a special mammogram due to scar  tissue.     Past Medical History:  Diagnosis Date   Allergy    Arthritis    Breast cancer (HCC)    Breast cancer, left breast (HCC) 10/04/2014   Diabetes mellitus without complication (HCC)    GERD (gastroesophageal reflux disease)    Hyperlipidemia    Hypertension    Osteoporosis    Sleep apnea     Medications: Outpatient Medications Prior to Visit  Medication Sig   albuterol  (VENTOLIN  HFA) 108 (90 Base) MCG/ACT inhaler Inhale 2 puffs into the lungs every 6 (six) hours as needed.   aspirin  EC 81 MG tablet Take 1 tablet (81 mg total) by mouth daily. Swallow whole.   Continuous Glucose Receiver (FREESTYLE LIBRE 2 READER) DEVI 1 each by Does not apply route as directed.   Continuous Glucose Sensor (FREESTYLE LIBRE 3 PLUS SENSOR) MISC by Does not apply route. Change sensor every 15 days.   dapagliflozin  propanediol (FARXIGA ) 10 MG TABS tablet Take 1 tablet (10 mg total) by mouth daily before breakfast.   glipiZIDE  (GLUCOTROL ) 5 MG tablet Take 1 tablet (5 mg total) by mouth daily before breakfast.   glucose blood (ONETOUCH VERIO) test strip To check blood sugar once daily   hydrocortisone  cream 1 % Apply 1 Application topically 2 (two) times daily. External vaginal application   ibuprofen  (ADVIL ) 200 MG tablet Take 400 mg by mouth every 6 (six) hours as needed.   insulin  degludec (TRESIBA  FLEXTOUCH) 100 UNIT/ML FlexTouch Pen Inject 26 Units into the skin at bedtime.   Insulin  Pen Needle (PEN NEEDLES) 32G  X 6 MM MISC 1 each by Does not apply route at bedtime.   lisinopril -hydrochlorothiazide  (ZESTORETIC ) 20-12.5 MG tablet Take 1 tablet by mouth daily.   montelukast  (SINGULAIR ) 10 MG tablet Take 1 tablet (10 mg total) by mouth daily.   OneTouch Delica Lancets 30G MISC 1 each by Does not apply route as directed.   pantoprazole  (PROTONIX ) 40 MG tablet Take 1 tablet (40 mg total) by mouth daily.   rosuvastatin  (CRESTOR ) 10 MG tablet Take 1 tablet (10 mg total) by mouth daily.    [DISCONTINUED] Blood Glucose Monitoring Suppl (ONETOUCH VERIO) w/Device KIT To check blood sugar once daily   [DISCONTINUED] Semaglutide , 1 MG/DOSE, 4 MG/3ML SOPN Inject 1 mg into the skin once a week. (Patient taking differently: Inject 2 mg into the skin once a week.)   [DISCONTINUED] lactulose  (CHRONULAC ) 10 GM/15ML solution Take 15 mLs (10 g total) by mouth 3 (three) times daily. (Patient not taking: Reported on 10/14/2023)   No facility-administered medications prior to visit.    Review of Systems  Last metabolic panel Lab Results  Component Value Date   GLUCOSE 130 (H) 10/02/2023   NA 142 10/02/2023   K 3.8 10/02/2023   CL 103 10/02/2023   CO2 24 10/02/2023   BUN 16 10/02/2023   CREATININE 0.62 10/02/2023   EGFR 99 10/02/2023   CALCIUM  9.2 10/02/2023   PROT 6.5 10/02/2023   ALBUMIN 4.0 10/02/2023   LABGLOB 2.5 10/02/2023   AGRATIO 1.4 05/29/2022   BILITOT 0.3 10/02/2023   ALKPHOS 87 10/02/2023   AST 8 10/02/2023   ALT 14 10/02/2023   ANIONGAP 7 06/15/2020   Last lipids Lab Results  Component Value Date   CHOL 145 10/02/2023   HDL 29 (L) 10/02/2023   LDLCALC 90 10/02/2023   TRIG 148 10/02/2023   CHOLHDL 5.0 (H) 10/02/2023   Last hemoglobin A1c Lab Results  Component Value Date   HGBA1C 8.0 (H) 07/16/2023   Last thyroid  functions Lab Results  Component Value Date   TSH 1.630 03/09/2023        Objective    BP (!) 138/57   Pulse 67   Ht 4\' 11"  (1.499 m)   Wt 217 lb (98.4 kg)   SpO2 100%   BMI 43.83 kg/m  BP Readings from Last 3 Encounters:  10/14/23 (!) 138/57  10/02/23 (!) 167/65  09/08/23 (!) 114/48   Wt Readings from Last 3 Encounters:  10/14/23 217 lb (98.4 kg)  10/02/23 224 lb 1.6 oz (101.7 kg)  09/08/23 218 lb 4.8 oz (99 kg)        Physical Exam Vitals reviewed.  Constitutional:      General: She is not in acute distress.    Appearance: Normal appearance. She is not ill-appearing, toxic-appearing or diaphoretic.  Eyes:      Conjunctiva/sclera: Conjunctivae normal.  Cardiovascular:     Rate and Rhythm: Normal rate and regular rhythm.     Pulses: Normal pulses.     Heart sounds: Normal heart sounds. No murmur heard.    No friction rub. No gallop.  Pulmonary:     Effort: Pulmonary effort is normal. No respiratory distress.     Breath sounds: Normal breath sounds. No stridor. No wheezing, rhonchi or rales.  Abdominal:     General: Bowel sounds are normal. There is no distension.     Palpations: Abdomen is soft.     Tenderness: There is no abdominal tenderness.  Musculoskeletal:     Right lower leg:  No edema.     Left lower leg: No edema.  Skin:    Findings: No erythema or rash.  Neurological:     Mental Status: She is alert and oriented to person, place, and time.  Psychiatric:        Mood and Affect: Mood and affect normal.        Speech: Speech normal.        Behavior: Behavior normal. Behavior is cooperative.       No results found for any visits on 10/14/23.  Assessment & Plan     Problem List Items Addressed This Visit       Cardiovascular and Mediastinum   Hypertension associated with diabetes (HCC)   Hypertension Chronic, stable  Currently managed with lisinopril  20 mg and hydrochlorothiazide  12.5 mg. Blood pressure today is 138/57 mmHg. Dizziness has resolved after holding spironolactone . - Continue lisinopril  20 mg daily & hydrochlorothiazide  12.5 mg daily - DC spironolactone       Relevant Medications   Semaglutide , 2 MG/DOSE, 8 MG/3ML SOPN     Endocrine   Uncontrolled type 2 diabetes mellitus with hyperglycemia, with long-term current use of insulin  (HCC)   Type 2 diabetes mellitus with hyperglycemia Currently managed with multiple medications. Recent adjustment of glipizide  to 5 mg due to episodes of hypoglycemia. Blood glucose levels have stabilized with morning readings around 148 mg/dL. Last A1c was 8.0% on February 6th. Weight has decreased by 7 pounds since April 25th.  Endocrinology follow-up scheduled for May 28th. - Continue Farxiga  10 mg daily - Continue glipizide  5 mg daily - Continue Tresiba  26 units at bedtime - Continue Ozempic , increase to 2 mg weekly after current supply is finished - f/u with endocrinology as scheduled on 11/04/23 - Order new glucometer and supplies from [employer] - Order A1c test      Relevant Medications   Semaglutide , 2 MG/DOSE, 8 MG/3ML SOPN   Hyperlipidemia associated with type 2 diabetes mellitus (HCC)   Chronic Currently managed with Crestor  10 mg. Last LDL was 90 mg/dL. - Continue Crestor  10 mg daily      Relevant Medications   Semaglutide , 2 MG/DOSE, 8 MG/3ML SOPN     Other   Personal history of breast cancer   UTD on mammograms through [facility]       Morbid obesity (HCC)   Chronic, BMI 43.83 Recent weight loss of 7 pounds since April 25th. She expresses a desire to lose 50 pounds safely. - Encourage continued weight management efforts - continue ozempic  2mg  weekly       Relevant Medications   Semaglutide , 2 MG/DOSE, 8 MG/3ML SOPN   Other Visit Diagnoses       Type 2 diabetes mellitus with diabetic polyneuropathy, without long-term current use of insulin  (HCC)    -  Primary   Relevant Medications   Blood Glucose Monitoring Suppl (ONETOUCH VERIO) w/Device KIT   Semaglutide , 2 MG/DOSE, 8 MG/3ML SOPN   Other Relevant Orders   Hemoglobin A1c     Osteoporosis screening       Relevant Orders   DG Bone Density        Assessment & Plan     Upper respiratory infection, resolved  Previously treated with antibiotics and Zyrtec . No current symptoms of dizziness or respiratory issues. - Continue Zyrtec  as needed - Continue Singulair  as needed  General Health Maintenance Bone density scan and mammogram discussed. She has a history of scar tissue requiring special mammograms. - Order bone density scan -  Acknowledge completion of mammogram at [facility], consider future mammograms  locally      Return in about 3 months (around 01/14/2024) for CHRONIC F/U.         Mimi Alt, MD  [facility] [phone] (phone) [phone] (fax)  [facility]

## 2023-10-14 NOTE — Patient Instructions (Signed)
 [facility] at [facility] [address] [place],  [place]  [address] Main: [phone]

## 2023-10-14 NOTE — Assessment & Plan Note (Signed)
 Hypertension Chronic, stable  Currently managed with lisinopril  20 mg and hydrochlorothiazide  12.5 mg. Blood pressure today is 138/57 mmHg. Dizziness has resolved after holding spironolactone . - Continue lisinopril  20 mg daily & hydrochlorothiazide  12.5 mg daily - DC spironolactone 

## 2023-10-15 LAB — HEMOGLOBIN A1C
Est. average glucose Bld gHb Est-mCnc: 197 mg/dL
Hgb A1c MFr Bld: 8.5 % — ABNORMAL HIGH (ref 4.8–5.6)

## 2023-10-19 ENCOUNTER — Ambulatory Visit (LOCAL_COMMUNITY_HEALTH_CENTER): Payer: Self-pay

## 2023-10-19 ENCOUNTER — Telehealth: Payer: Self-pay

## 2023-10-19 DIAGNOSIS — Z111 Encounter for screening for respiratory tuberculosis: Secondary | ICD-10-CM

## 2023-10-19 NOTE — Telephone Encounter (Signed)
 Finally spoke to patient Alexis Lloyd asked me to read her results of A1c and scheduled her for 6 mo f/u. Notified her that Alexis Lloyd will call back with bone density result when they come in

## 2023-10-19 NOTE — Telephone Encounter (Signed)
 Copied from CRM [id number]. Topic: Clinical - Lab/Test Results >> Oct 19, 2023 10:30 AM Rosaria Common wrote: Reason for CRM: Pt calling in regards to lab results, stated Dr. Verdia Glad notes to pt, but she would like further clarity. Called clinic, placed on hold. Pt disconnected call, and CMA Arlina Benjamin will give pt a callback.

## 2023-10-20 NOTE — Telephone Encounter (Signed)
 Called  but was only able to leave a voice message informing the it of the message per Dr R. She calls back ok to relay the message again.

## 2023-10-20 NOTE — Telephone Encounter (Signed)
 Bone density has not been scheduled yet but has been ordered. Please advise patient to call to schedule bone density scan   [facility] Main: [phone]   Her chest xray was normal from 09/08/23

## 2023-10-21 ENCOUNTER — Ambulatory Visit

## 2023-10-21 DIAGNOSIS — Z111 Encounter for screening for respiratory tuberculosis: Secondary | ICD-10-CM

## 2023-10-21 LAB — TB SKIN TEST
Induration: 0 mm
TB Skin Test: NEGATIVE

## 2023-11-03 ENCOUNTER — Telehealth: Payer: Self-pay | Admitting: Pharmacist

## 2023-11-03 NOTE — Progress Notes (Signed)
   11/03/2023  Patient ID: Alexis Lloyd, female   DOB: 04/01/1961, 63 y.o.   MRN: [id number]  Received fax image stating that patient was approved for generic Tresiba .  Called [facility]. Was told a refill form for generic Tresiba  and Ozempic  2mg  were received. Will get next packaging of Ozempic  when due again and Tresiba  generic should be arriving soon.  No further concerns at this time. Of note, I did NOT send this application in. Unknown quantity for Tresiba  requested.  Also had them correct suite number from 250 to 200 while on the phone.    Delvin File, PharmD [facility]  Phone Number: [phone]

## 2023-11-06 ENCOUNTER — Other Ambulatory Visit: Payer: Self-pay | Admitting: Physician Assistant

## 2023-11-06 DIAGNOSIS — J4520 Mild intermittent asthma, uncomplicated: Secondary | ICD-10-CM

## 2023-11-06 DIAGNOSIS — R0602 Shortness of breath: Secondary | ICD-10-CM

## 2023-11-06 DIAGNOSIS — R051 Acute cough: Secondary | ICD-10-CM

## 2023-11-13 ENCOUNTER — Telehealth: Payer: Self-pay | Admitting: Family Medicine

## 2023-11-13 NOTE — Telephone Encounter (Signed)
 Spoke to patient to let her know the Degludec pens came in from patient assistance program.

## 2023-11-30 ENCOUNTER — Telehealth: Payer: Self-pay | Admitting: Family Medicine

## 2023-11-30 NOTE — Telephone Encounter (Signed)
 [facility] pharmacy is requesting refill Blood Glucose Monitoring Suppl (ONETOUCH VERIO) w/Device KIT  Please advise

## 2023-11-30 NOTE — Telephone Encounter (Signed)
 Prescription sent in on 10/14/2023 to [facility] pharmacy

## 2023-12-02 ENCOUNTER — Other Ambulatory Visit (HOSPITAL_COMMUNITY): Payer: Self-pay

## 2023-12-02 ENCOUNTER — Telehealth: Payer: Self-pay | Admitting: Pharmacy Technician

## 2023-12-02 DIAGNOSIS — E1165 Type 2 diabetes mellitus with hyperglycemia: Secondary | ICD-10-CM

## 2023-12-02 NOTE — Telephone Encounter (Signed)
 Pharmacy Patient Advocate Encounter   Received notification from Onbase that prior authorization for OneTouch Verio Flex System w/Device kit is required/requested.   Insurance verification completed.   The patient is insured through [employer] .   Per test claim:  ACCU-CHEK GUIDE TESTING SUPPLIES is preferred by the insurance.  If suggested medication is appropriate, Please send in a new RX and discontinue this one. If not, please advise as to why it's not appropriate so that we may request a Prior Authorization. Please note, some preferred medications may still require a PA.  If the suggested medications have not been trialed and there are no contraindications to their use, the PA will not be submitted, as it will not be approved.

## 2023-12-03 MED ORDER — ACCU-CHEK GUIDE W/DEVICE KIT: 1.0000 | PACK | Freq: Every morning | 1 refills | Status: DC

## 2023-12-03 NOTE — Telephone Encounter (Signed)
 Rx sent for insurance preferred glucose meter

## 2023-12-04 ENCOUNTER — Other Ambulatory Visit: Payer: Self-pay

## 2023-12-04 DIAGNOSIS — E1142 Type 2 diabetes mellitus with diabetic polyneuropathy: Secondary | ICD-10-CM

## 2023-12-04 MED ORDER — ACCU-CHEK GUIDE TEST VI STRP: ORAL_STRIP | 12 refills | Status: DC

## 2023-12-04 MED ORDER — ACCU-CHEK SOFTCLIX LANCETS MISC: 12 refills | Status: DC

## 2023-12-18 ENCOUNTER — Ambulatory Visit: Payer: Self-pay

## 2023-12-18 NOTE — Telephone Encounter (Signed)
Please see the message below and advise.

## 2023-12-18 NOTE — Telephone Encounter (Signed)
 Pt states that she gets this about every 3 months due her the diabetic medications and usually gets something called in. States she want to get medication called in for the frequency and itching.

## 2023-12-18 NOTE — Telephone Encounter (Signed)
 FYI Only or Action Required?: Action required by provider: update on patient condition and wants medication called in.  Patient was last seen in primary care on 10/14/2023 by Sharma Coyer, MD.  Called Nurse Triage reporting Urinary Frequency.  Symptoms began several days ago.  Interventions attempted: Nothing.  Symptoms are: unchanged.  Triage Disposition: See Physician Within 24 Hours  Patient/caregiver understands and will follow disposition?: No, wishes to speak with PCP      Copied from CRM [id number]. Topic: Clinical - Red Word Triage >> Dec 18, 2023  2:29 PM Alexis Lloyd wrote: Red Word that prompted transfer to Nurse Triage: uti and or a yeast infection. Symptoms frequent urination, itching. Reason for Disposition  Urinating more frequently than usual (i.e., frequency) OR new-onset of the feeling of an urgent need to urinate (i.e., urgency)  Answer Assessment - Initial Assessment Questions 1. SYMPTOM: What's the main symptom you're concerned about? (e.g., frequency, incontinence)     Frequency and incontinence 2. ONSET: When did the  Frequency and incontinence  start?     2 days 3. PAIN: Is there any pain? If Yes, ask: How bad is it? (Scale: 1-10; mild, moderate, severe)     no 4. CAUSE: What do you think is causing the symptoms?     Uti and yeast infection 5. OTHER SYMPTOMS: Do you have any other symptoms? (e.g., blood in urine, fever, flank pain, pain with urination)     Mild Vag itching  Protocols used: Urinary Symptoms-A-AH

## 2023-12-21 NOTE — Telephone Encounter (Signed)
 Called and was able to inform the pt of the message per Dr R, she stated she was already scheduled for 8/7 and had no other questions

## 2023-12-21 NOTE — Telephone Encounter (Signed)
 Suspect symptoms may be related to Farxiga , please advise pt to hold this medication and schedule her for follow up in next 2-3 weeks with myself or next available provider.

## 2024-01-05 ENCOUNTER — Ambulatory Visit (INDEPENDENT_AMBULATORY_CARE_PROVIDER_SITE_OTHER): Admitting: Family Medicine

## 2024-01-05 ENCOUNTER — Ambulatory Visit: Payer: Self-pay | Admitting: Family Medicine

## 2024-01-05 ENCOUNTER — Encounter: Payer: Self-pay | Admitting: Family Medicine

## 2024-01-05 VITALS — BP 136/80 | HR 88 | Temp 98.5°F | Ht 59.0 in | Wt 216.5 lb

## 2024-01-05 DIAGNOSIS — I152 Hypertension secondary to endocrine disorders: Secondary | ICD-10-CM | POA: Diagnosis not present

## 2024-01-05 DIAGNOSIS — M545 Low back pain, unspecified: Secondary | ICD-10-CM

## 2024-01-05 DIAGNOSIS — E785 Hyperlipidemia, unspecified: Secondary | ICD-10-CM

## 2024-01-05 DIAGNOSIS — E1165 Type 2 diabetes mellitus with hyperglycemia: Secondary | ICD-10-CM | POA: Diagnosis not present

## 2024-01-05 DIAGNOSIS — E1159 Type 2 diabetes mellitus with other circulatory complications: Secondary | ICD-10-CM

## 2024-01-05 DIAGNOSIS — E1169 Type 2 diabetes mellitus with other specified complication: Secondary | ICD-10-CM | POA: Diagnosis not present

## 2024-01-05 DIAGNOSIS — Z794 Long term (current) use of insulin: Secondary | ICD-10-CM | POA: Diagnosis not present

## 2024-01-05 DIAGNOSIS — R1084 Generalized abdominal pain: Secondary | ICD-10-CM

## 2024-01-05 LAB — POCT URINALYSIS DIPSTICK
Blood, UA: NEGATIVE
Glucose, UA: POSITIVE — AB
Ketones, UA: NEGATIVE
Leukocytes, UA: NEGATIVE
Nitrite, UA: NEGATIVE
Protein, UA: POSITIVE — AB
Spec Grav, UA: 1.02 (ref 1.010–1.025)
Urobilinogen, UA: 0.2 U/dL
pH, UA: 7.5 (ref 5.0–8.0)

## 2024-01-05 MED ORDER — BACLOFEN 10 MG PO TABS: 10.0000 mg | ORAL_TABLET | Freq: Three times a day (TID) | ORAL | 0 refills | Status: DC

## 2024-01-05 NOTE — Patient Instructions (Signed)
 Please report to [facility] located at:  [address]  [place], [place] [address]  You do not need an appointment to have xrays completed.   Our office will follow up with  results once available.

## 2024-01-05 NOTE — Progress Notes (Signed)
 Established patient visit   Patient: Alexis Lloyd   DOB: August 21, 1960   63 y.o. Female  MRN: [id number] Visit Date: 01/05/2024  Today's healthcare provider: Rockie Agent, MD   Chief Complaint  Patient presents with   Medical Management of Chronic Issues    Mammogram- November or October 2024, [facility] Diabetic Eye Exam- Scheduled for 01/06/2024 Dexa Scan- Already scheduled.  Pain in lower back, urine checked due possible infection.   Hypertension    Hypertension Patient is here for follow-up of elevated blood pressure. She is exercising a little bit and is adherent to a no-salt diet. Monitors blood pressure at home stated tht it was running low.  Patient denies any symptoms.     Diabetes    Mornings it runs to about 130-150s before eating and then later around lunchtime it drops 70-80 if she has not had anything to eat by that time.    Subjective     HPI     Medical Management of Chronic Issues    Additional comments: Mammogram- November or October 2024, [facility] Diabetic Eye Exam- Scheduled for 01/06/2024 Dexa Scan- Already scheduled.  Pain in lower back, urine checked due possible infection.        Hypertension    Additional comments: Hypertension Patient is here for follow-up of elevated blood pressure. She is exercising a little bit and is adherent to a no-salt diet. Monitors blood pressure at home stated tht it was running low.  Patient denies any symptoms.          Diabetes    Additional comments: Mornings it runs to about 130-150s before eating and then later around lunchtime it drops 70-80 if she has not had anything to eat by that time.       Last edited by Terrel Powell CROME, CMA on 01/05/2024 10:36 AM.       Discussed the use of AI scribe software for clinical note transcription with the patient, who gave verbal consent to proceed.  History of Present Illness Alexis Lloyd is a 63 year old female  with diabetes and hypertension who presents for chronic medical follow-up and reports lower back pain.  She experiences excruciating lower back pain that radiates around her hips. The pain is persistent and unrelieved by Advil . She suspects it may be related to her kidneys or bladder, although she has not yet provided a urine sample for testing.  She has a history of diabetes with blood sugar levels ranging from 130 to 150 mg/dL before meals, occasionally dropping to 70-80 mg/dL around lunchtime. Her last A1c was 8.5%. She is on multiple diabetic medications including Farxiga , Ozempic , and Tresiba , and has stopped taking glipizide  due to episodes of hypoglycemia. She mentions experiencing itching, which she associates with the use of Farxiga , and has resumed taking it after a brief discontinuation. She is concerned about the number of diabetic medications and their side effects.  Her hypertension is managed with lisinopril  20 mg and hydrochlorothiazide  12.5 mg, with recent blood pressure readings around 136/80 mmHg. She reports minimal exercise and adherence to a low-salt diet.  She is scheduled for a bone density scan and an eye exam for diabetes management. She has a history of knee replacement and is considering an x-ray for her back pain.     Past Medical History:  Diagnosis Date   Allergy    Arthritis    Breast cancer (HCC)    Breast cancer, left breast (  HCC) 10/04/2014   Diabetes mellitus without complication (HCC)    GERD (gastroesophageal reflux disease)    Hyperlipidemia    Hypertension    Osteoporosis    Sleep apnea     Medications: Outpatient Medications Prior to Visit  Medication Sig   Accu-Chek Softclix Lancets lancets Use as instructed   albuterol  (VENTOLIN  HFA) 108 (90 Base) MCG/ACT inhaler INHALE 2 PUFFS BY MOUTH EVERY 6 HOURS AS NEEDED   aspirin  EC 81 MG tablet Take 1 tablet (81 mg total) by mouth daily. Swallow whole.   Blood Glucose Monitoring Suppl (ACCU-CHEK  GUIDE) w/Device KIT 1 Device by Does not apply route in the morning.   Continuous Glucose Receiver (FREESTYLE LIBRE 2 READER) DEVI 1 each by Does not apply route as directed.   Continuous Glucose Sensor (FREESTYLE LIBRE 3 PLUS SENSOR) MISC by Does not apply route. Change sensor every 15 days.   cyanocobalamin  (VITAMIN B12) 1000 MCG tablet Take 1,000 mcg by mouth daily.   dapagliflozin  propanediol (FARXIGA ) 10 MG TABS tablet Take 1 tablet (10 mg total) by mouth daily before breakfast.   glucose blood (ACCU-CHEK GUIDE TEST) test strip Use as instructed   glucose blood (ONETOUCH VERIO) test strip To check blood sugar once daily   hydrocortisone  cream 1 % Apply 1 Application topically 2 (two) times daily. External vaginal application   ibuprofen  (ADVIL ) 200 MG tablet Take 400 mg by mouth every 6 (six) hours as needed.   insulin  degludec (TRESIBA  FLEXTOUCH) 100 UNIT/ML FlexTouch Pen Inject 26 Units into the skin at bedtime.   Insulin  Pen Needle (PEN NEEDLES) 32G X 6 MM MISC 1 each by Does not apply route at bedtime.   lisinopril -hydrochlorothiazide  (ZESTORETIC ) 20-12.5 MG tablet Take 1 tablet by mouth daily.   montelukast  (SINGULAIR ) 10 MG tablet Take 1 tablet (10 mg total) by mouth daily.   omega-3 acid ethyl esters (LOVAZA) 1 g capsule Take 1 g by mouth daily.   OneTouch Delica Lancets 30G MISC 1 each by Does not apply route as directed.   pantoprazole  (PROTONIX ) 40 MG tablet Take 1 tablet (40 mg total) by mouth daily.   rosuvastatin  (CRESTOR ) 10 MG tablet Take 1 tablet (10 mg total) by mouth daily.   Semaglutide , 2 MG/DOSE, 8 MG/3ML SOPN Inject 2 mg as directed once a week.   [DISCONTINUED] glipiZIDE  (GLUCOTROL ) 5 MG tablet Take 1 tablet (5 mg total) by mouth daily before breakfast. (Patient not taking: Reported on 01/05/2024)   No facility-administered medications prior to visit.    Review of Systems  Last metabolic panel Lab Results  Component Value Date   GLUCOSE 130 (H) 10/02/2023   NA  142 10/02/2023   K 3.8 10/02/2023   CL 103 10/02/2023   CO2 24 10/02/2023   BUN 16 10/02/2023   CREATININE 0.62 10/02/2023   EGFR 99 10/02/2023   CALCIUM  9.2 10/02/2023   PROT 6.5 10/02/2023   ALBUMIN 4.0 10/02/2023   LABGLOB 2.5 10/02/2023   AGRATIO 1.4 05/29/2022   BILITOT 0.3 10/02/2023   ALKPHOS 87 10/02/2023   AST 8 10/02/2023   ALT 14 10/02/2023   ANIONGAP 7 06/15/2020   Last lipids Lab Results  Component Value Date   CHOL 145 10/02/2023   HDL 29 (L) 10/02/2023   LDLCALC 90 10/02/2023   TRIG 148 10/02/2023   CHOLHDL 5.0 (H) 10/02/2023   Last hemoglobin A1c Lab Results  Component Value Date   HGBA1C 8.5 (H) 10/14/2023        Objective  BP 136/80 (BP Location: Right Arm, Patient Position: Sitting, Cuff Size: Normal)   Pulse 88   Temp 98.5 F (36.9 C) (Oral)   Ht 4' 11 (1.499 m)   Wt 216 lb 8 oz (98.2 kg)   SpO2 96%   BMI 43.73 kg/m  BP Readings from Last 3 Encounters:  01/05/24 136/80  10/14/23 (!) 138/57  10/02/23 (!) 167/65   Wt Readings from Last 3 Encounters:  01/05/24 216 lb 8 oz (98.2 kg)  10/14/23 217 lb (98.4 kg)  10/02/23 224 lb 1.6 oz (101.7 kg)        Physical Exam  Physical Exam VITALS: BP- 136/80 ABDOMEN: Tenderness at hernia site, no lower abdominal tenderness. No costovertebral angle tenderness. MUSCULOSKELETAL: Good range of motion and rotation in spine. Negative straight leg raise bilaterally.    No results found for any visits on 01/05/24.  Assessment & Plan     Problem List Items Addressed This Visit       Cardiovascular and Mediastinum   Hypertension associated with diabetes (HCC)   Relevant Medications   omega-3 acid ethyl esters (LOVAZA) 1 g capsule     Endocrine   Uncontrolled type 2 diabetes mellitus with hyperglycemia, with long-term current use of insulin  (HCC)   Hyperlipidemia associated with type 2 diabetes mellitus (HCC)   Relevant Medications   omega-3 acid ethyl esters (LOVAZA) 1 g capsule    Other Visit Diagnoses       Generalized abdominal pain    -  Primary   Relevant Orders   POCT Urinalysis Dipstick   Basic Metabolic Panel (BMET)   Urine Culture     Acute bilateral low back pain without sciatica       Relevant Medications   baclofen  (LIORESAL ) 10 MG tablet   Other Relevant Orders   Basic Metabolic Panel (BMET)   Urine Culture   DG Lumbar Spine Complete        Assessment & Plan Low back pain Chronic low back pain with new onset, described as excruciating and radiating to the hips. Pain is not relieved by Advil . No CVA tenderness or lower abdominal tenderness, reducing suspicion of a urinary tract infection. Differential includes musculoskeletal pain or possible arthritis. - Order x-ray of the back - Prescribe baclofen  10 mg three times a day for muscle relaxation  Possible urinary tract infection Reports of lower back pain and urinary symptoms suggestive of a possible urinary tract infection. However, no lower abdominal tenderness where a UTI would typically present. Urine culture and metabolic panel are necessary to evaluate kidney function and confirm diagnosis. - Order urine culture - Order metabolic panel to check kidney function  Type 2 diabetes mellitus Type 2 diabetes with recent A1c of 8.5%. Blood sugar levels range from 130-150 mg/dL before meals and drop to 70-80 mg/dL around lunchtime. Currently on multiple diabetic medications including Farxiga , Ozempic , and Tresiba . Reports GU symptoms potentially related to Farxiga . Glipizide  was self-discontinued due to hypoglycemic episodes. The goal is to achieve an A1c under 8%. - Continue Ozempic  2 mg once a week - Continue Tresiba  26 units at bedtime - Continue Farxiga  10 mg but consider switching if GU symptoms persist - Discontinue glipizide  - Recheck A1c in one month  Hypertension Chronic hypertension with current blood pressure at 136/80 mmHg, indicating decent control. Currently on lisinopril  with  hydrochlorothiazide . - Continue lisinopril  20 mg with hydrochlorothiazide  12.5 mg     Return in about 1 month (around 02/05/2024) for DM.  Rockie Agent, MD  [facility] [phone] (phone) [phone] (fax)  [facility]

## 2024-01-06 ENCOUNTER — Ambulatory Visit
Admission: RE | Admit: 2024-01-06 | Discharge: 2024-01-06 | Disposition: A | Discharge status: Home / Self Care | Attending: Family Medicine | Admitting: Family Medicine

## 2024-01-06 ENCOUNTER — Ambulatory Visit
Admission: RE | Admit: 2024-01-06 | Discharge: 2024-01-06 | Disposition: A | Discharge status: Home / Self Care | Source: Ambulatory Visit | Attending: Family Medicine | Admitting: Family Medicine

## 2024-01-06 DIAGNOSIS — M545 Low back pain, unspecified: Secondary | ICD-10-CM | POA: Insufficient documentation

## 2024-01-06 LAB — BASIC METABOLIC PANEL WITH GFR
BUN/Creatinine Ratio: 18 (ref 12–28)
BUN: 16 mg/dL (ref 8–27)
CO2: 24 mmol/L (ref 20–29)
Calcium: 9.7 mg/dL (ref 8.7–10.3)
Chloride: 103 mmol/L (ref 96–106)
Creatinine, Ser: 0.88 mg/dL (ref 0.57–1.00)
Glucose: 132 mg/dL — ABNORMAL HIGH (ref 70–99)
Potassium: 3.6 mmol/L (ref 3.5–5.2)
Sodium: 143 mmol/L (ref 134–144)
eGFR: 73 mL/min/1.73 (ref >59)

## 2024-01-06 LAB — HM DIABETES EYE EXAM

## 2024-01-07 ENCOUNTER — Other Ambulatory Visit: Payer: Self-pay | Admitting: Family Medicine

## 2024-01-07 ENCOUNTER — Ambulatory Visit: Payer: Self-pay

## 2024-01-07 MED ORDER — CIPROFLOXACIN HCL 500 MG PO TABS: 500.0000 mg | ORAL_TABLET | Freq: Two times a day (BID) | ORAL | 0 refills | Status: AC

## 2024-01-07 NOTE — Telephone Encounter (Signed)
 Please advise patient to stop Farxiga , start Ciprofloxacin  500mg  twice daily for 7 days. Culture is still pending

## 2024-01-07 NOTE — Telephone Encounter (Signed)
 Pt advised. Verbalized understanding.

## 2024-01-07 NOTE — Telephone Encounter (Signed)
Cipro 500 mg BID for 5 days

## 2024-01-07 NOTE — Telephone Encounter (Signed)
 FYI Only or Action Required?: Action required by provider: update on patient condition.  Patient was last seen in primary care on 01/05/2024 by Sharma Coyer, MD.  Called Nurse Triage reporting No chief complaint on file..  Symptoms began today.  Interventions attempted: Nothing.  Symptoms are: unchanged.  Triage Disposition: Call PCP When Office is Open  Patient/caregiver understands and will follow disposition?: YesCopied from CRM [id number]. Topic: Clinical - Red Word Triage >> Jan 07, 2024  8:13 AM Carlatta H wrote: [facility] that prompted transfer to Nurse Triage: Patient is still severe pain on the right side of her back//Patient has to rush to the bathroom in order to avoid urinating on herself// Reason for Disposition  [1] Caller requesting NON-URGENT health information AND [2] PCP's office is the best resource  Answer Assessment - Initial Assessment Questions 1. REASON FOR CALL: What is the main reason for your call? or How can I best help you? Pt wanted to know if urine culture has been released to let provider know pt had back x-ray yesterday and picking up muscle relaxer today. Pt is still having pain in lower back-right side.  Protocols used: Information Only Call - No Triage-A-AH

## 2024-01-08 ENCOUNTER — Other Ambulatory Visit: Payer: Self-pay | Admitting: Family Medicine

## 2024-01-08 DIAGNOSIS — J40 Bronchitis, not specified as acute or chronic: Secondary | ICD-10-CM

## 2024-01-08 LAB — URINE CULTURE

## 2024-01-08 LAB — SPECIMEN STATUS REPORT

## 2024-01-08 NOTE — Telephone Encounter (Signed)
 Copied from CRM #[id number]. Topic: Clinical - Medication Refill >> Jan 08, 2024  4:17 PM Delon T wrote: Medication: montelukast  (SINGULAIR ) 10 MG tablet  Has the patient contacted their pharmacy? No (Agent: If no, request that the patient contact the pharmacy for the refill. If patient does not wish to contact the pharmacy document the reason why and proceed with request.) (Agent: If yes, when and what did the pharmacy advise?)  This is the patient's preferred pharmacy:  [facility] [address] ([address]), [place] - [address] [place]) [place] [address] Phone: [phone] Fax: [phone]    Is this the correct pharmacy for this prescription? Yes If no, delete pharmacy and type the correct one.   Has the prescription been filled recently? Yes  Is the patient out of the medication? Yes  Has the patient been seen for an appointment in the last year OR does the patient have an upcoming appointment? Yes  Can we respond through MyChart? No  Agent: Please be advised that Rx refills may take up to 3 business days. We ask that you follow-up with your pharmacy.

## 2024-01-08 NOTE — Progress Notes (Signed)
 Called and informed patient of her results.  Let her know that she was positive for an UTI and to follow up with her pharmacy for her medication.  Patient verbalized understanding.

## 2024-01-11 ENCOUNTER — Ambulatory Visit: Payer: Self-pay

## 2024-01-11 ENCOUNTER — Ambulatory Visit (INDEPENDENT_AMBULATORY_CARE_PROVIDER_SITE_OTHER): Admitting: Family Medicine

## 2024-01-11 ENCOUNTER — Encounter: Payer: Self-pay | Admitting: Family Medicine

## 2024-01-11 VITALS — BP 130/62 | HR 64 | Temp 98.9°F | Ht 59.0 in | Wt 218.5 lb

## 2024-01-11 DIAGNOSIS — M5431 Sciatica, right side: Secondary | ICD-10-CM | POA: Diagnosis not present

## 2024-01-11 MED ORDER — KETOROLAC TROMETHAMINE 30 MG/ML IJ SOLN: 30.0000 mg | Freq: Once | INTRAMUSCULAR | Status: DC

## 2024-01-11 MED ORDER — KETOROLAC TROMETHAMINE 60 MG/2ML IM SOLN
60.0000 mg | Freq: Once | INTRAMUSCULAR | Status: AC
Start: 2024-01-11 — End: 2024-01-16

## 2024-01-11 MED ORDER — PREDNISONE 20 MG PO TABS: ORAL_TABLET | ORAL | 0 refills | Status: DC

## 2024-01-11 NOTE — Addendum Note (Signed)
 Addended by: TERREL POWELL CROME on: 01/11/2024 04:35 PM   Modules accepted: Orders

## 2024-01-11 NOTE — Telephone Encounter (Signed)
 Patient call note and symptoms reviewed. Agree with scheduled appt. Will evaluate during OV

## 2024-01-11 NOTE — Telephone Encounter (Signed)
 Too soon for RF, LRF 10/09/23 for 90 and 1 RF.  Requested Prescriptions  Pending Prescriptions Disp Refills   montelukast  (SINGULAIR ) 10 MG tablet 90 tablet 1    Sig: Take 1 tablet (10 mg total) by mouth daily.     Pulmonology:  Leukotriene Inhibitors Passed - 01/11/2024 12:21 PM      Passed - Valid encounter within last 12 months    Recent Outpatient Visits           6 days ago Generalized abdominal pain   [place] [facility] Simmons-Robinson, [place], MD   2 months ago Type 2 diabetes mellitus with diabetic polyneuropathy, without long-term current use of insulin  (HCC)   [place] [facility] Simmons-Robinson, [place], MD   3 months ago Hyperlipidemia associated with type 2 diabetes mellitus [facility])   [place] [facility] [place], [place], PA-C   4 months ago Hypertension associated with diabetes [facility])   [place] [facility] Simmons-Robinson, [place], MD   5 months ago Vagina itching   [facility] [place], [place]

## 2024-01-11 NOTE — Telephone Encounter (Signed)
 FYI Only or Action Required?: FYI only for provider.  Patient was last seen in primary care on 01/05/2024 by Sharma Coyer, MD.  Called Nurse Triage reporting Back Pain.  Symptoms began a week ago.  Interventions attempted: Prescription medications: Cipro  for UTI, muscle relaxer.  Symptoms are: gradually worsening.  Triage Disposition: See HCP Within 4 Hours (Or PCP Triage)  Patient/caregiver understands and will follow disposition?: Yes    Copied from CRM #[id number]. Topic: Clinical - Red Word Triage >> Jan 11, 2024 10:48 AM Tiffini S wrote: [facility] that prompted transfer to Nurse Triage:  Patient is having pain in the lower right near the buttock in the hip area down to the right knee area. Was told she it is a UTI- in extreme pain. Reason for Disposition  [1] SEVERE back pain (e.g., excruciating, unable to do any normal activities) AND [2] not improved 2 hours after pain medicine  Answer Assessment - Initial Assessment Questions Patient was treated for UTI but stated symptoms haven't changed  1. ONSET: When did the pain begin? (e.g., minutes, hours, days)     Last week 2. LOCATION: Where does it hurt? (upper, mid or lower back)     Right back pain down to hip and right knee 3. SEVERITY: How bad is the pain?  (e.g., Scale 1-10; mild, moderate, or severe)     8-9 4. PATTERN: Is the pain constant? (e.g., yes, no; constant, intermittent)      Constant 5. RADIATION: Does the pain shoot into your legs or somewhere else?     Radiating into right hip and right leg 6. CAUSE:  What do you think is causing the back pain?      Patient was initially told it was UTI but Cipro  isn't helping, muscle relaxer isn't helping 8. MEDICINES: What have you taken so far for the pain? (e.g., nothing, acetaminophen , NSAIDS)     Muscle relaxer 9. NEUROLOGIC SYMPTOMS: Do you have any weakness, numbness, or problems with bowel/bladder control?     No 10. OTHER SYMPTOMS: Do  you have any other symptoms? (e.g., fever, abdomen pain, burning with urination, blood in urine)       Pain radiating into hip, urinary frequency  Protocols used: Back Pain-A-AH

## 2024-01-11 NOTE — Patient Instructions (Signed)
  VISIT SUMMARY: Today, you were seen for persistent right-sided abdominal and back pain that has been affecting your daily activities. You also reported frequent urination and have a history of type 2 diabetes mellitus.  YOUR PLAN: -RIGHT-SIDED LUMBAR RADICULOPATHY (SCIATICA): This condition involves nerve pain that starts in the lower back and radiates down the leg, causing severe, sharp, and shooting pain. You received a 30 mg Toradol  injection today to help with the pain. You are also prescribed prednisone , starting with 40 mg daily for 3 days, then 20 mg daily for the next 3 days. Please monitor your blood glucose levels while taking prednisone . Continue taking ibuprofen  200-400 mg every 6 hours as needed for pain relief.  -TYPE 2 DIABETES MELLITUS: Type 2 diabetes mellitus is a condition that affects how your body processes blood sugar. Because prednisone  can affect your blood sugar levels, you should increase your Tresiba  dose to 31 units nightly while on prednisone  and monitor your glucose levels closely.                      Contains text generated by [employer]bridge.                                 Contains text generated by Abridge.

## 2024-01-11 NOTE — Progress Notes (Signed)
 ACUTE VISIT   Patient: Alexis Lloyd   DOB: September 03, 1960   63 y.o. Female  MRN: [id number]   PCP: Sharma Coyer, MD  Chief Complaint  Patient presents with   Acute Visit    Patient is here due to her right side hurting her all the way down to her leg.  Continuously shooting and sharp pains.  States it has been going on for over a week.  Pain is unbearable, can't hardly lift her leg to get in the car.     Subjective    HPI HPI     Acute Visit    Additional comments: Patient is here due to her right side hurting her all the way down to her leg.  Continuously shooting and sharp pains.  States it has been going on for over a week.  Pain is unbearable, can't hardly lift her leg to get in the car.        Last edited by Terrel Powell CROME, CMA on 01/11/2024  1:05 PM.       Discussed the use of AI scribe software for clinical note transcription with the patient, who gave verbal consent to proceed.  History of Present Illness Alexis Lloyd is a 63 year old female who presents with right-sided abdominal and back pain.  She has been experiencing persistent right-sided abdominal and back pain for over a week. The pain is described as shooting and sharp, radiating down to her leg, and is 'unbearable,' significantly impacting her ability to lift her leg and get into a car. The pain initially started in her back and moved to her right side, extending down her leg.  She was previously treated for a urinary tract infection with enterococcus faecalis, receiving superfluoxazine 500 mg twice daily for five days starting on January 07, 2024. Despite this treatment, there has been no improvement in her symptoms, and she continues to experience frequent urination, although she denies pain during urination.  Her medical history includes a knee replacement, and she mentions having a 'steel knee.' She recalls receiving Toradol  injections in the past for migraine headaches and a shoulder  issue. She has been taking muscle relaxants prescribed previously but reports no relief from her current symptoms.  No recent falls or injuries to her leg. She reports urinating every hour. She experiences pain when lifting her leg and describes a 'pulling' sensation in her muscles. No bladder-related pain but confirms the presence of pressure in her pelvis and lower back when bending.     Medications: Outpatient Medications Prior to Visit  Medication Sig   Accu-Chek Softclix Lancets lancets Use as instructed   albuterol  (VENTOLIN  HFA) 108 (90 Base) MCG/ACT inhaler INHALE 2 PUFFS BY MOUTH EVERY 6 HOURS AS NEEDED   aspirin  EC 81 MG tablet Take 1 tablet (81 mg total) by mouth daily. Swallow whole.   baclofen  (LIORESAL ) 10 MG tablet Take 1 tablet (10 mg total) by mouth 3 (three) times daily.   Blood Glucose Monitoring Suppl (ACCU-CHEK GUIDE) w/Device KIT 1 Device by Does not apply route in the morning.   ciprofloxacin  (CIPRO ) 500 MG tablet Take 1 tablet (500 mg total) by mouth 2 (two) times daily for 5 days.   Continuous Glucose Receiver (FREESTYLE LIBRE 2 READER) DEVI 1 each by Does not apply route as directed.   Continuous Glucose Sensor (FREESTYLE LIBRE 3 PLUS SENSOR) MISC by Does not apply route. Change sensor every 15 days.   cyanocobalamin  (VITAMIN B12) 1000  MCG tablet Take 1,000 mcg by mouth daily.   dapagliflozin  propanediol (FARXIGA ) 10 MG TABS tablet Take 1 tablet (10 mg total) by mouth daily before breakfast.   glucose blood (ACCU-CHEK GUIDE TEST) test strip Use as instructed   glucose blood (ONETOUCH VERIO) test strip To check blood sugar once daily   hydrocortisone  cream 1 % Apply 1 Application topically 2 (two) times daily. External vaginal application   ibuprofen  (ADVIL ) 200 MG tablet Take 400 mg by mouth every 6 (six) hours as needed.   insulin  degludec (TRESIBA  FLEXTOUCH) 100 UNIT/ML FlexTouch Pen Inject 26 Units into the skin at bedtime.   Insulin  Pen Needle (PEN NEEDLES) 32G  X 6 MM MISC 1 each by Does not apply route at bedtime.   lisinopril -hydrochlorothiazide  (ZESTORETIC ) 20-12.5 MG tablet Take 1 tablet by mouth daily.   montelukast  (SINGULAIR ) 10 MG tablet Take 1 tablet (10 mg total) by mouth daily.   omega-3 acid ethyl esters (LOVAZA) 1 g capsule Take 1 g by mouth daily.   OneTouch Delica Lancets 30G MISC 1 each by Does not apply route as directed.   pantoprazole  (PROTONIX ) 40 MG tablet Take 1 tablet (40 mg total) by mouth daily.   rosuvastatin  (CRESTOR ) 10 MG tablet Take 1 tablet (10 mg total) by mouth daily.   Semaglutide , 2 MG/DOSE, 8 MG/3ML SOPN Inject 2 mg as directed once a week.   No facility-administered medications prior to visit.    Last CBC Lab Results  Component Value Date   WBC 7.4 10/02/2023   HGB 11.5 10/02/2023   HCT 34.8 10/02/2023   MCV 86 10/02/2023   MCH 28.3 10/02/2023   RDW 13.1 10/02/2023   PLT 286 10/02/2023   Last metabolic panel Lab Results  Component Value Date   GLUCOSE 132 (H) 01/05/2024   NA 143 01/05/2024   K 3.6 01/05/2024   CL 103 01/05/2024   CO2 24 01/05/2024   BUN 16 01/05/2024   CREATININE 0.88 01/05/2024   EGFR 73 01/05/2024   CALCIUM  9.7 01/05/2024   PROT 6.5 10/02/2023   ALBUMIN 4.0 10/02/2023   LABGLOB 2.5 10/02/2023   AGRATIO 1.4 05/29/2022   BILITOT 0.3 10/02/2023   ALKPHOS 87 10/02/2023   AST 8 10/02/2023   ALT 14 10/02/2023   ANIONGAP 7 06/15/2020   Recent Results (from the past 720 hours)  Urine Culture     Status: Abnormal   Collection Time: 01/05/24 12:00 AM   Specimen: Urine   Urine  Result Value Ref Range Status   Urine Culture, Routine Final report (A)  Final   Organism ID, Bacteria Enterococcus faecalis (A)  Final    Comment: Enterococci susceptible to penicillin are predictably susceptible to ampicillin, amoxicillin, ampicillin-sulbactam, amoxicillin-clavulanate, and piperacillin-tazobactam for non-beta-lactamase producing enterococci. (CLSI 2018) For Enterococcus species,  aminoglycosides (except for high-level resistance screening), cephalosporins, clindamycin, and trimethoprim-sulfamethoxazole are not effective clinically. (CLSI, M100-S26, 2016) 50,000-100,000 colony forming units per mL    Antimicrobial Susceptibility Comment  Final    Comment:       ** S = Susceptible; I = Intermediate; R = Resistant **                    P = Positive; N = Negative             MICS are expressed in micrograms per mL    Antibiotic                 RSLT#1  RSLT#2    RSLT#3    RSLT#4 Ciprofloxacin                   S Levofloxacin                   S Nitrofurantoin                  S Penicillin                     S Tetracycline                   R Vancomycin                     S    No results found.       Objective    BP 130/62 (BP Location: Right Arm, Patient Position: Sitting, Cuff Size: Large)   Pulse 64   Temp 98.9 F (37.2 C) (Oral)   Ht 4' 11 (1.499 m)   Wt 218 lb 8 oz (99.1 kg)   SpO2 100%   BMI 44.13 kg/m  BP Readings from Last 3 Encounters:  01/11/24 130/62  01/05/24 136/80  10/14/23 (!) 138/57   Wt Readings from Last 3 Encounters:  01/11/24 218 lb 8 oz (99.1 kg)  01/05/24 216 lb 8 oz (98.2 kg)  10/14/23 217 lb (98.4 kg)      Physical Exam   Physical Exam MUSCULOSKELETAL: Limited range of motion with pain on flexion at right hip. Holds right leg straight with knee extended, appears in discomfort. Lumbar back pain L4-L5, no swelling or erythema. SKIN: Skin changes in lumbar region of back.   No results found for any visits on 01/11/24.  Assessment & Plan      Assessment & Plan Right-sided lumbar radiculopathy (sciatica) Right-sided lumbar radiculopathy with severe, sharp, and shooting pain radiating from the lumbar region to the leg, consistent with sciatica. Pain affects her ability to lift her leg and causes significant discomfort. Differential diagnosis includes nerve pain rather than bladder-related issues. Limited range of  motion at the right hip due to pain. No swelling or erythema in the lumbar region. Imaging may be required for further evaluation. - Administer 30 mg Toradol  injection today - Prescribe prednisone  40 mg daily for 3 days, followed by 20 mg daily for 3 days - Advise to monitor blood glucose levels due to prednisone  use - Continue ibuprofen  200-400 mg every 6 hours as needed for pain  Type 2 diabetes mellitus Chronic Type 2 diabetes mellitus with potential impact on blood glucose levels due to prednisone  treatment. - Increase Tresiba  to 30-31 units nightly while on prednisone  - Advise to monitor glucose closely  Urinary symptoms (frequency) Urinary frequency reported, not associated with dysuria. Previous treatment with ciprofloxacin  for UTI was not completed. Symptoms suggest a separate issue from the current pain, possibly unrelated to UTI. - Advise against restarting Farxiga  due to urinary symptoms      No follow-ups on file.        Rockie Agent, MD  [facility] [phone] (phone) [phone] (fax)  [facility]

## 2024-01-14 ENCOUNTER — Other Ambulatory Visit: Payer: Self-pay | Admitting: Family Medicine

## 2024-01-14 ENCOUNTER — Ambulatory Visit: Admitting: Family Medicine

## 2024-01-14 DIAGNOSIS — J40 Bronchitis, not specified as acute or chronic: Secondary | ICD-10-CM

## 2024-01-14 NOTE — Telephone Encounter (Signed)
 Copied from CRM [id number]. Topic: Clinical - Medication Question >> Jan 14, 2024 11:09 AM Myrick T wrote: Reason for CRM: patient called to f/u on her refill for  montelukast  (SINGULAIR ) 10 MG tablet . Advised patient that it showed she had 1 refill at the pharmacy. Please f/u with pharmacy per patient says they have nothing for her.

## 2024-01-16 NOTE — Telephone Encounter (Signed)
 Pharmacy will need to be called to verify if they have refills on file for this medication or if a new Rx is needed.

## 2024-01-18 NOTE — Telephone Encounter (Signed)
 [facility] pharmacy, Pharmacy tech states that patient picked up medication 01/17/24 for 90 days. No refill needed at this time.

## 2024-01-18 NOTE — Telephone Encounter (Signed)
 Duplicate request.  Requested Prescriptions  Pending Prescriptions Disp Refills   montelukast  (SINGULAIR ) 10 MG tablet 90 tablet 1    Sig: Take 1 tablet (10 mg total) by mouth daily.     Pulmonology:  Leukotriene Inhibitors Passed - 01/18/2024  9:31 AM      Passed - Valid encounter within last 12 months    Recent Outpatient Visits           1 week ago Sciatica of right side   [place] [facility] [place], [place], MD   1 week ago Generalized abdominal pain   [place] [facility] [place], [place], MD   3 months ago Type 2 diabetes mellitus with diabetic polyneuropathy, without long-term current use of insulin  [facility])   [place] [facility] [place], [employer], MD   3 months ago Hyperlipidemia associated with type 2 diabetes mellitus [facility])   [place] [facility] [place], [place], PA-C   4 months ago Hypertension associated with diabetes [facility])   [place] [facility] Sharma Coyer, MD

## 2024-01-21 ENCOUNTER — Other Ambulatory Visit: Payer: Self-pay | Admitting: Family Medicine

## 2024-01-21 DIAGNOSIS — J4520 Mild intermittent asthma, uncomplicated: Secondary | ICD-10-CM

## 2024-01-21 DIAGNOSIS — R051 Acute cough: Secondary | ICD-10-CM

## 2024-01-21 DIAGNOSIS — R0602 Shortness of breath: Secondary | ICD-10-CM

## 2024-01-26 ENCOUNTER — Ambulatory Visit: Payer: Medicare HMO

## 2024-02-02 ENCOUNTER — Other Ambulatory Visit: Payer: Self-pay | Admitting: Family Medicine

## 2024-02-02 DIAGNOSIS — K219 Gastro-esophageal reflux disease without esophagitis: Secondary | ICD-10-CM

## 2024-02-10 ENCOUNTER — Ambulatory Visit (INDEPENDENT_AMBULATORY_CARE_PROVIDER_SITE_OTHER)

## 2024-02-10 ENCOUNTER — Telehealth: Payer: Self-pay

## 2024-02-10 DIAGNOSIS — Z0000 Encounter for general adult medical examination without abnormal findings: Secondary | ICD-10-CM | POA: Diagnosis not present

## 2024-02-10 NOTE — Patient Instructions (Signed)
 [name] , Thank you for taking time out of your busy schedule to complete your Annual Wellness Visit with me. I enjoyed our conversation and look forward to speaking with you again next year. I, as well as your care team,  appreciate your ongoing commitment to your health goals. Please review the following plan we discussed and let me know if I can assist you in the future.   Follow up Visits: 02/15/25 @ 8:10 AM BY PHONE We will see or speak with you next year for your Next Medicare AWV with our clinical staff Have you seen your provider in the last 6 months (3 months if uncontrolled diabetes)? Yes  Clinician Recommendations:  Aim for 30 minutes of exercise or brisk walking, 6-8 glasses of water, and 5 servings of fruits and vegetables each day. TAKE CARE!      This is a list of the screenings recommended for you:  Health Maintenance  Topic Date Due   Pneumococcal Vaccine for age over 49 (2 of 2 - PCV) 08/05/2016   Zoster (Shingles) Vaccine (2 of 2) 10/10/2021   Mammogram  12/17/2022   DEXA scan (bone density measurement)  Never done   Flu Shot  01/08/2024   COVID-19 Vaccine (5 - 2025-26 season) 02/08/2024   Yearly kidney health urinalysis for diabetes  03/08/2024   Hemoglobin A1C  04/15/2024   Pap with HPV screening  05/17/2024   Complete foot exam   07/15/2024   Yearly kidney function blood test for diabetes  01/04/2025   Eye exam for diabetics  01/05/2025   Medicare Annual Wellness Visit  02/09/2025   DTaP/Tdap/Td vaccine (2 - Td or Tdap) 09/01/2027   Colon Cancer Screening  09/12/2027   Hepatitis C Screening  Completed   HIV Screening  Completed   Hepatitis B Vaccine  Aged Out   HPV Vaccine  Aged Out   Meningitis B Vaccine  Aged Out    Advanced directives: (ACP Link)Information on Advanced Care Planning can be found at [employer] Advance Health Care Directives Advance Health Care Directives. [email or web address]  Advance Care Planning is important because  it:  [x]  Makes sure you receive the medical care that is consistent with your values, goals, and preferences  [x]  It provides guidance to your family and loved ones and reduces their decisional burden about whether or not they are making the right decisions based on your wishes.  Follow the link provided in your after visit summary or read over the paperwork we have mailed to you to help you started getting your Advance Directives in place. If you need assistance in completing these, please reach out to us  so that we can help you!

## 2024-02-10 NOTE — Telephone Encounter (Signed)
 Copied from CRM [id number]. Topic: Clinical - Prescription Issue >> Feb 10, 2024  9:24 AM Emylou G wrote: Reason for CRM: Please call patient.. checking status of  Pantoprazole  Sodium 40 mg Oral Daily Refill

## 2024-02-10 NOTE — Telephone Encounter (Signed)
 Called and advised patient refill was sent to [facility] pharmacy.

## 2024-02-10 NOTE — Progress Notes (Signed)
 Subjective:   Alexis Lloyd is a 63 y.o. who presents for a Medicare Wellness preventive visit.  As a reminder, Annual Wellness Visits don't include a physical exam, and some assessments may be limited, especially if this visit is performed virtually. We may recommend an in-person follow-up visit with your provider if needed.  Visit Complete: Virtual I connected with  Alexis Lloyd on 02/10/24 by a audio enabled telemedicine application and verified that I am speaking with the correct person using two identifiers.  Patient Location: Home  Provider Location: Home Office  I discussed the limitations of evaluation and management by telemedicine. The patient expressed understanding and agreed to proceed.  Vital Signs: Because this visit was a virtual/telehealth visit, some criteria may be missing or patient reported. Any vitals not documented were not able to be obtained and vitals that have been documented are patient reported.  VideoDeclined- This patient declined Librarian, academic. Therefore the visit was completed with audio only.  Persons Participating in Visit: Patient.  AWV Questionnaire: No: Patient Medicare AWV questionnaire was not completed prior to this visit.  Cardiac Risk Factors include: advanced age (>66men, >41 women);diabetes mellitus;dyslipidemia;hypertension;sedentary lifestyle;obesity (BMI >30kg/m2)     Objective:    There were no vitals filed for this visit. There is no height or weight on file to calculate BMI.     02/10/2024    3:20 PM 01/21/2023   11:51 AM 12/26/2021    1:27 PM 06/12/2020    4:10 PM 06/10/2020    5:48 PM 10/28/2019    1:43 PM 02/21/2018    5:54 PM  Advanced Directives  Does Patient Have a Medical Advance Directive? No No No  No No No   Would patient like information on creating a medical advance directive? No - Patient declined  No - Patient declined No - Patient declined  No - Patient declined      Data saved  with a previous flowsheet row definition    Current Medications (verified) Outpatient Encounter Medications as of 02/10/2024  Medication Sig   Accu-Chek Softclix Lancets lancets Use as instructed   albuterol  (VENTOLIN  HFA) 108 (90 Base) MCG/ACT inhaler INHALE 2 PUFFS BY MOUTH EVERY 6 HOURS AS NEEDED   aspirin  EC 81 MG tablet Take 1 tablet (81 mg total) by mouth daily. Swallow whole.   baclofen  (LIORESAL ) 10 MG tablet Take 1 tablet (10 mg total) by mouth 3 (three) times daily.   Blood Glucose Monitoring Suppl (ACCU-CHEK GUIDE) w/Device KIT 1 Device by Does not apply route in the morning.   Continuous Glucose Receiver (FREESTYLE LIBRE 2 READER) DEVI 1 each by Does not apply route as directed.   Continuous Glucose Sensor (FREESTYLE LIBRE 3 PLUS SENSOR) MISC by Does not apply route. Change sensor every 15 days.   cyanocobalamin  (VITAMIN B12) 1000 MCG tablet Take 1,000 mcg by mouth daily.   glucose blood (ACCU-CHEK GUIDE TEST) test strip Use as instructed   glucose blood (ONETOUCH VERIO) test strip To check blood sugar once daily   hydrocortisone  cream 1 % Apply 1 Application topically 2 (two) times daily. External vaginal application   insulin  degludec (TRESIBA  FLEXTOUCH) 100 UNIT/ML FlexTouch Pen Inject 26 Units into the skin at bedtime. (Patient taking differently: Inject 31 Units into the skin at bedtime.)   Insulin  Pen Needle (PEN NEEDLES) 32G X 6 MM MISC 1 each by Does not apply route at bedtime.   lisinopril -hydrochlorothiazide  (ZESTORETIC ) 20-12.5 MG tablet Take 1 tablet by mouth  daily.   montelukast  (SINGULAIR ) 10 MG tablet Take 1 tablet (10 mg total) by mouth daily.   omega-3 acid ethyl esters (LOVAZA) 1 g capsule Take 1 g by mouth daily.   OneTouch Delica Lancets 30G MISC 1 each by Does not apply route as directed.   pantoprazole  (PROTONIX ) 40 MG tablet Take 1 tablet by mouth once daily   rosuvastatin  (CRESTOR ) 10 MG tablet Take 1 tablet (10 mg total) by mouth daily.   Semaglutide , 2  MG/DOSE, 8 MG/3ML SOPN Inject 2 mg as directed once a week.   dapagliflozin  propanediol (FARXIGA ) 10 MG TABS tablet Take 1 tablet (10 mg total) by mouth daily before breakfast. (Patient not taking: Reported on 02/10/2024)   ibuprofen  (ADVIL ) 200 MG tablet Take 400 mg by mouth every 6 (six) hours as needed. (Patient not taking: Reported on 02/10/2024)   [DISCONTINUED] Semaglutide , 1 MG/DOSE, 4 MG/3ML SOPN Inject 1 mg into the skin once a week. (Patient taking differently: Inject 2 mg into the skin once a week.)   No facility-administered encounter medications on file as of 02/10/2024.    Allergies (verified) Allegra [fexofenadine], Avelox [moxifloxacin], Bactrim [sulfamethoxazole-trimethoprim], Etodolac, Fish-derived products, Penicillins, Shellfish allergy, Sulfa antibiotics, and Macrobid  [nitrofurantoin  monohyd macro]   History: Past Medical History:  Diagnosis Date   Allergy    Arthritis    Breast cancer (HCC)    Breast cancer, left breast (HCC) 10/04/2014   Diabetes mellitus without complication (HCC)    GERD (gastroesophageal reflux disease)    Hyperlipidemia    Hypertension    Osteoporosis    Sleep apnea    Past Surgical History:  Procedure Laterality Date   ABDOMINAL HYSTERECTOMY     due to fibroid tumors 1990 Dr. Araceli   BREAST BIOPSY Left    negative   BREAST BIOPSY Left    04/2013 positive   BREAST CYST ASPIRATION Left    BREAST EXCISIONAL BIOPSY Left    2015   BREAST LUMPECTOMY Left 06/17/2013   BREAST REDUCTION SURGERY Bilateral    CESAREAN SECTION     x's 2   CHOLECYSTECTOMY  09/2001   COLONOSCOPY WITH PROPOFOL  N/A 09/11/2017   Procedure: COLONOSCOPY WITH PROPOFOL ;  Surgeon: Janalyn Keene NOVAK, MD;  Location: [facility];  Service: Endoscopy;  Laterality: N/A;   ESOPHAGOGASTRODUODENOSCOPY (EGD) WITH PROPOFOL  N/A 09/11/2017   Procedure: ESOPHAGOGASTRODUODENOSCOPY (EGD) WITH PROPOFOL ;  Surgeon: Janalyn Keene NOVAK, MD;  Location: [facility] ENDOSCOPY;  Service:  Endoscopy;  Laterality: N/A;   REDUCTION MAMMAPLASTY     s/p radiation therapy Left    2015   Family History  Problem Relation Age of Onset   Hypertension Mother    Cancer Mother        breast and rectal cancer   Breast cancer Mother 16   Asthma Brother    Hypertension Brother    Allergic rhinitis Brother    Breast cancer Maternal Grandmother    Social History   Socioeconomic History   Marital status: Married    Spouse name: Alexis Lloyd   Number of children: 2   Years of education: some college   Highest education level: Not on file  Occupational History    Employer: DISABLED  Tobacco Use   Smoking status: Former    Current packs/day: 0.00    Average packs/day: 0.3 packs/day for 21.0 years (5.3 ttl pk-yrs)    Types: Cigarettes    Start date: 06/08/1976    Quit date: 06/08/1997    Years since quitting: 26.6   Smokeless  tobacco: Never  Vaping Use   Vaping status: Never Used  Substance and Sexual Activity   Alcohol use: No   Drug use: No   Sexual activity: Yes    Birth control/protection: Surgical  Other Topics Concern   Not on file  Social History Narrative   Left handed   Lives in a 2 story home with husband   Social Drivers of Health   Financial Resource Strain: Low Risk  (02/10/2024)   Overall Financial Resource Strain (CARDIA)    Difficulty of Paying Living Expenses: Not hard at all  Food Insecurity: No Food Insecurity (02/10/2024)   Hunger Vital Sign    Worried About Running Out of Food in the Last Year: Never true    Ran Out of Food in the Last Year: Never true  Transportation Needs: No Transportation Needs (02/10/2024)   PRAPARE - Administrator, Civil Service (Medical): No    Lack of Transportation (Non-Medical): No  Physical Activity: Insufficiently Active (02/10/2024)   Exercise Vital Sign    Days of Exercise per Week: 2 days    Minutes of Exercise per Session: 20 min  Stress: No Stress Concern Present (02/10/2024)   [employer] of  Occupational Health - Occupational Stress Questionnaire    Feeling of Stress: Not at all  Social Connections: Moderately Integrated (02/10/2024)   Social Connection and Isolation Panel    Frequency of Communication with Friends and Family: More than three times a week    Frequency of Social Gatherings with Friends and Family: More than three times a week    Attends Religious Services: More than 4 times per year    Active Member of [employer] or Organizations: No    Attends Engineer, structural: Never    Marital Status: Married    Tobacco Counseling Counseling given: Not Answered    Clinical Intake:  Pre-visit preparation completed: Yes  Pain : No/denies pain     BMI - recorded: 44 Nutritional Status: BMI > 30  Obese Nutritional Risks: None Diabetes: Yes CBG done?: No Did pt. bring in CBG monitor from home?: No  Lab Results  Component Value Date   HGBA1C 8.5 (H) 10/14/2023   HGBA1C 8.0 (H) 07/16/2023   HGBA1C 8.6 10/22/2022        Interpreter Needed?: No  Information entered by :: JHONNIE DAS, LPN   Activities of Daily Living     02/10/2024    3:21 PM  In your present state of health, do you have any difficulty performing the following activities:  Hearing? 0  Vision? 0  Difficulty concentrating or making decisions? 0  Walking or climbing stairs? 0  Dressing or bathing? 0  Doing errands, shopping? 0  Preparing Food and eating ? N  Using the Toilet? N  In the past six months, have you accidently leaked urine? N  Do you have problems with loss of bowel control? N  Managing your Medications? N  Managing your Finances? N  Housekeeping or managing your Housekeeping? N    Patient Care Team: Sharma Coyer, MD as PCP - General (Family Medicine) Carlin Rollene HERO, CNM as Midwife (Obstetrics) Cherilyn Debby CROME, MD as Consulting Physician (Endocrinology) Pa, [facility] (Optometry)  I have updated your Care Teams any recent Medical  Services you may have received from other providers in the past year.     Assessment:   This is a routine wellness examination for Alexis Lloyd.  Hearing/Vision screen Hearing Screening - Comments:: NO  AIDS Vision Screening - Comments:: WEARS GLASSES SOMETIMES- [facility]- VISIT LAST MONTH   Goals Addressed             This Visit's Progress    DIET - EAT MORE FRUITS AND VEGETABLES         Depression Screen     02/10/2024    3:19 PM 01/11/2024    1:05 PM 10/02/2023    9:58 AM 09/08/2023    1:48 PM 08/03/2023    1:31 PM 04/08/2023    1:35 PM 01/21/2023   11:50 AM  PHQ 2/9 Scores  PHQ - 2 Score 0 2 1 0 0 0 0  PHQ- 9 Score 0 9 4 0 0      Fall Risk     02/10/2024    3:21 PM 01/11/2024    1:05 PM 09/08/2023    1:48 PM 08/03/2023    1:32 PM 01/21/2023   11:47 AM  Fall Risk   Falls in the past year? 0 0 0 0 0  Number falls in past yr: 0 0 0 0 0  Injury with Fall? 0 0 0 0 0  Risk for fall due to : No Fall Risks No Fall Risks No Fall Risks  No Fall Risks  Follow up Falls evaluation completed;Falls prevention discussed    Falls prevention discussed;Education provided    MEDICARE RISK AT HOME:  Medicare Risk at Home Any stairs in or around the home?: Yes If so, are there any without handrails?: No Home free of loose throw rugs in walkways, pet beds, electrical cords, etc?: Yes Adequate lighting in your home to reduce risk of falls?: Yes Life alert?: No Use of a cane, walker or w/c?: No Grab bars in the bathroom?: No Shower chair or bench in shower?: No Elevated toilet seat or a handicapped toilet?: No  TIMED UP AND GO:  Was the test performed?  No  Cognitive Function: 6CIT completed        02/10/2024    3:23 PM 01/21/2023   11:53 AM  6CIT Screen  What Year? 0 points 0 points  What month? 0 points 0 points  What time? 0 points 0 points  Count back from 20 0 points 0 points  Months in reverse 0 points 0 points  Repeat phrase 2 points 0 points  Total Score 2 points 0 points     Immunizations Immunization History  Administered Date(s) Administered   PFIZER Comirnaty (Gray Top)Covid-19 Tri-Sucrose Vaccine 08/15/2020   PFIZER(Purple Top)SARS-COV-2 Vaccination 07/31/2019, 09/13/2019   PPD Test 10/19/2023   Pfizer Covid-19 Vaccine Bivalent Booster 58yrs & up 03/28/2021   Pneumococcal Polysaccharide-23 06/22/2009, 08/06/2015   Tdap 08/31/2017   Zoster Recombinant(Shingrix ) 08/15/2021    Screening Tests Health Maintenance  Topic Date Due   Pneumococcal Vaccine: 50+ Years (2 of 2 - PCV) 08/05/2016   Zoster Vaccines- Shingrix  (2 of 2) 10/10/2021   MAMMOGRAM  12/17/2022   DEXA SCAN  Never done   INFLUENZA VACCINE  01/08/2024   COVID-19 Vaccine (5 - 2025-26 season) 02/08/2024   Diabetic kidney evaluation - Urine ACR  03/08/2024   HEMOGLOBIN A1C  04/15/2024   Cervical Cancer Screening (HPV/Pap Cotest)  05/17/2024   FOOT EXAM  07/15/2024   Diabetic kidney evaluation - eGFR measurement  01/04/2025   OPHTHALMOLOGY EXAM  01/05/2025   Medicare Annual Wellness (AWV)  02/09/2025   DTaP/Tdap/Td (2 - Td or Tdap) 09/01/2027   Colonoscopy  09/12/2027   Hepatitis C Screening  Completed  HIV Screening  Completed   Hepatitis B Vaccines 19-59 Average Risk  Aged Out   HPV VACCINES  Aged Out   Meningococcal B Vaccine  Aged Out    Health Maintenance  Health Maintenance Due  Topic Date Due   Pneumococcal Vaccine: 50+ Years (2 of 2 - PCV) 08/05/2016   Zoster Vaccines- Shingrix  (2 of 2) 10/10/2021   MAMMOGRAM  12/17/2022   DEXA SCAN  Never done   INFLUENZA VACCINE  01/08/2024   COVID-19 Vaccine (5 - 2025-26 season) 02/08/2024   Diabetic kidney evaluation - Urine ACR  03/08/2024   Health Maintenance Items Addressed: UP TO DATE ON TDAP, NEEDS 2ND SHINGRIX , PNA, & COVID; MAMMOGRAM SCHEDULED 9/25 AT [facility];COLONOSCOPY UP TO DATE; HAD BDS COUPLE MONTHS AGO PER PT  Additional Screening:  Vision Screening: Recommended annual ophthalmology exams for early detection of  glaucoma and other disorders of the eye. Would you like a referral to an eye doctor? No    Dental Screening: Recommended annual dental exams for proper oral hygiene  Community Resource Referral / Chronic Care Management: CRR required this visit?  No   CCM required this visit?  No   Plan:    I have personally reviewed and noted the following in the patient's chart:   Medical and social history Use of alcohol, tobacco or illicit drugs  Current medications and supplements including opioid prescriptions. Patient is not currently taking opioid prescriptions. Functional ability and status Nutritional status Physical activity Advanced directives List of other physicians Hospitalizations, surgeries, and ER visits in previous 12 months Vitals Screenings to include cognitive, depression, and falls Referrals and appointments  In addition, I have reviewed and discussed with patient certain preventive protocols, quality metrics, and best practice recommendations. A written personalized care plan for preventive services as well as general preventive health recommendations were provided to patient.   Jhonnie GORMAN Das, LPN   [id number]   After Visit Summary: (MyChart) Due to this being a telephonic visit, the after visit summary with patients personalized plan was offered to patient via MyChart   Notes: Nothing significant to report at this time.

## 2024-02-11 ENCOUNTER — Ambulatory Visit: Admitting: Family Medicine

## 2024-02-11 ENCOUNTER — Encounter: Payer: Self-pay | Admitting: Family Medicine

## 2024-02-11 VITALS — BP 142/82 | HR 71 | Temp 99.1°F | Resp 16 | Ht 59.0 in | Wt 217.1 lb

## 2024-02-11 DIAGNOSIS — Z794 Long term (current) use of insulin: Secondary | ICD-10-CM | POA: Diagnosis not present

## 2024-02-11 DIAGNOSIS — E785 Hyperlipidemia, unspecified: Secondary | ICD-10-CM | POA: Diagnosis not present

## 2024-02-11 DIAGNOSIS — I152 Hypertension secondary to endocrine disorders: Secondary | ICD-10-CM

## 2024-02-11 DIAGNOSIS — E1165 Type 2 diabetes mellitus with hyperglycemia: Secondary | ICD-10-CM | POA: Diagnosis not present

## 2024-02-11 DIAGNOSIS — E1159 Type 2 diabetes mellitus with other circulatory complications: Secondary | ICD-10-CM

## 2024-02-11 DIAGNOSIS — E1169 Type 2 diabetes mellitus with other specified complication: Secondary | ICD-10-CM

## 2024-02-11 MED ORDER — ROSUVASTATIN CALCIUM 10 MG PO TABS
10.0000 mg | ORAL_TABLET | Freq: Every day | ORAL | 3 refills | Status: AC
Start: 2024-02-11 — End: ?

## 2024-02-11 MED ORDER — GLIPIZIDE 5 MG PO TABS: 5.0000 mg | ORAL_TABLET | Freq: Every day | ORAL | 3 refills | Status: DC

## 2024-02-11 NOTE — Progress Notes (Signed)
 Established patient visit   Patient: Alexis Lloyd   DOB: [birth year]   63 y.o. Female  MRN: [id number] Visit Date: 02/11/2024  Today's healthcare provider: Rockie Agent, MD   Chief Complaint  Patient presents with   Medical Management of Chronic Issues    T2DM-patient reports sugar levels are high and she has been off the farxiga . Patient has an upcoming appointment with Endo 03/23/24- A1c three month ago was 8%.   Subjective     HPI     Medical Management of Chronic Issues    Additional comments: T2DM-patient reports sugar levels are high and she has been off the farxiga . Patient has an upcoming appointment with Endo 03/23/24- A1c three month ago was 8%.      Last edited by Rosas, Joseline E, CMA on 02/11/2024 10:00 AM.       Discussed the use of AI scribe software for clinical note transcription with the patient, who gave verbal consent to proceed.  History of Present Illness Alexis Lloyd is a 63 year old female with chronic hypertension and type 2 diabetes who presents for management of chronic hyperglycemia and hypertension.  She has been experiencing elevated blood sugars, particularly in the mornings, with recent readings as high as 240 mg/dL and a fasting blood sugar of 219 mg/dL today. Her A1c was 8.5% three months ago. She is currently using Tresiba  31 units at night and Ozempic  2 mg weekly. She previously used Farxiga  but discontinued it due to urinary tract infection symptoms, which have since resolved. She also experienced hypoglycemia with glipizide , leading to its discontinuation. She uses a continuous glucose monitor and reports fluctuating blood sugars. She has a history of negative eye exams, with the most recent in July 2024, and is due for a bone density scan and mammogram.  Her hypertension is managed with lisinopril  20 mg and hydrochlorothiazide  12.5 mg daily. Her blood pressure today was elevated at 158/82 mmHg, although her blood  pressure at home is usually around 130/60 mmHg.  Her medication regimen also includes Crestor  10 mg daily for hyperlipidemia, Protonix  40 mg daily for GERD, and Lovaza 1 gram daily. She has previously tried Januvia  and metformin , which caused diarrhea, and Actos , which caused edema. Trulicity  and ribosis were ineffective for her diabetes management.  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     Past Medical History:  Diagnosis Date   Allergy    Arthritis    Breast cancer (HCC)    Breast cancer, left breast (HCC) 10/04/2014   Diabetes mellitus without complication (HCC)    GERD (gastroesophageal reflux disease)    Hyperlipidemia    Hypertension    Osteoporosis    Sleep apnea     Medications: Outpatient Medications Prior to Visit  Medication Sig Note   Accu-Chek Softclix Lancets lancets Use as instructed    albuterol  (VENTOLIN  HFA) 108 (90 Base) MCG/ACT inhaler INHALE 2 PUFFS BY MOUTH EVERY 6 HOURS AS NEEDED    aspirin  EC 81 MG tablet Take 1 tablet (81 mg total) by mouth daily. Swallow whole.    baclofen  (LIORESAL ) 10 MG tablet Take 1 tablet (10 mg total) by mouth 3 (three) times daily.    Blood Glucose Monitoring Suppl (ACCU-CHEK GUIDE) w/Device KIT 1 Device by Does not apply route in the morning.  Continuous Glucose Receiver (FREESTYLE LIBRE 2 READER) DEVI 1 each by Does not apply route as directed.    Continuous Glucose Sensor (FREESTYLE LIBRE 3 PLUS SENSOR) MISC by Does not apply route. Change sensor every 15 days.    cyanocobalamin  (VITAMIN B12) 1000 MCG tablet Take 1,000 mcg by mouth daily.    glucose blood (ACCU-CHEK GUIDE TEST) test strip Use as instructed    glucose blood (ONETOUCH VERIO) test strip To check blood sugar once daily    hydrocortisone  cream 1 % Apply 1 Application topically 2 (two) times daily. External vaginal  application    ibuprofen  (ADVIL ) 200 MG tablet Take 400 mg by mouth every 6 (six) hours as needed.    insulin  degludec (TRESIBA  FLEXTOUCH) 100 UNIT/ML FlexTouch Pen Inject 31 Units into the skin at bedtime.    Insulin  Pen Needle (PEN NEEDLES) 32G X 6 MM MISC 1 each by Does not apply route at bedtime.    lisinopril -hydrochlorothiazide  (ZESTORETIC ) 20-12.5 MG tablet Take 1 tablet by mouth daily.    montelukast  (SINGULAIR ) 10 MG tablet Take 1 tablet (10 mg total) by mouth daily.    omega-3 acid ethyl esters (LOVAZA) 1 g capsule Take 1 g by mouth daily.    OneTouch Delica Lancets 30G MISC 1 each by Does not apply route as directed.    pantoprazole  (PROTONIX ) 40 MG tablet Take 1 tablet by mouth once daily    Semaglutide , 2 MG/DOSE, 8 MG/3ML SOPN Inject 2 mg as directed once a week.    [DISCONTINUED] rosuvastatin  (CRESTOR ) 10 MG tablet Take 1 tablet (10 mg total) by mouth daily.    [DISCONTINUED] dapagliflozin  propanediol (FARXIGA ) 10 MG TABS tablet Take 1 tablet (10 mg total) by mouth daily before breakfast. (Patient not taking: Reported on 02/10/2024) 02/11/2024: UTI symtpoms   No facility-administered medications prior to visit.    Review of Systems  Last metabolic panel Lab Results  Component Value Date   GLUCOSE 132 (H) 01/05/2024   NA 143 01/05/2024   K 3.6 01/05/2024   CL 103 01/05/2024   CO2 24 01/05/2024   BUN 16 01/05/2024   CREATININE 0.88 01/05/2024   EGFR 73 01/05/2024   CALCIUM  9.7 01/05/2024   PROT 6.5 10/02/2023   ALBUMIN 4.0 10/02/2023   LABGLOB 2.5 10/02/2023   AGRATIO 1.4 05/29/2022   BILITOT 0.3 10/02/2023   ALKPHOS 87 10/02/2023   AST 8 10/02/2023   ALT 14 10/02/2023   ANIONGAP 7 06/15/2020   Last lipids Lab Results  Component Value Date   CHOL 145 10/02/2023   HDL 29 (L) 10/02/2023   LDLCALC 90 10/02/2023   TRIG 148 10/02/2023   CHOLHDL 5.0 (H) 10/02/2023   The 10-year ASCVD risk score (Arnett DK, et al., 2019) is: 22.5%  Last hemoglobin A1c Lab Results   Component Value Date   HGBA1C 8.5 (H) 10/14/2023   Last thyroid  functions Lab Results  Component Value Date   TSH 1.630 03/09/2023        Objective    BP (!) 142/82 (Cuff Size: Large)   Pulse 71   Temp 99.1 F (37.3 C) (Oral)   Resp 16   Ht 4' 11 (1.499 m)   Wt 217 lb 1.6 oz (98.5 kg)   SpO2 97%   BMI 43.85 kg/m  BP Readings from Last 3 Encounters:  02/11/24 (!) 142/82  01/11/24 130/62  01/05/24 136/80   Wt Readings from Last 3 Encounters:  02/11/24 217 lb 1.6 oz (98.5 kg)  01/11/24 218  lb 8 oz (99.1 kg)  01/05/24 216 lb 8 oz (98.2 kg)        Physical Exam Vitals reviewed.  Constitutional:      General: She is not in acute distress.    Appearance: Normal appearance. She is not ill-appearing.  Cardiovascular:     Rate and Rhythm: Normal rate and regular rhythm.  Pulmonary:     Effort: Pulmonary effort is normal. No respiratory distress.     Breath sounds: No wheezing, rhonchi or rales.  Neurological:     Mental Status: She is alert and oriented to person, place, and time.  Psychiatric:        Mood and Affect: Mood normal.        Behavior: Behavior normal.       No results found for any visits on 02/11/24.  Assessment & Plan     Problem List Items Addressed This Visit     Hyperlipidemia associated with type 2 diabetes mellitus (HCC)   Relevant Medications   rosuvastatin  (CRESTOR ) 10 MG tablet   glipiZIDE  (GLUCOTROL ) 5 MG tablet   Hypertension associated with diabetes (HCC) - Primary   Relevant Medications   rosuvastatin  (CRESTOR ) 10 MG tablet   glipiZIDE  (GLUCOTROL ) 5 MG tablet   Uncontrolled type 2 diabetes mellitus with hyperglycemia, with long-term current use of insulin  (HCC)   Relevant Medications   rosuvastatin  (CRESTOR ) 10 MG tablet   glipiZIDE  (GLUCOTROL ) 5 MG tablet   Other Relevant Orders   Hemoglobin A1c    Assessment and Plan Assessment & Plan Type 2 diabetes mellitus with hyperglycemia Chronic hyperglycemia with recent A1c  of 8.5. Blood sugars elevated, particularly fasting levels, with readings up to 240s in the morning. Farxiga  discontinued due to UTI symptoms, now resolved. Glipizide  previously stopped due to hypoglycemia. Currently on Tresiba  31 units at night and Ozempic  2 mg weekly. Considering reintroducing glipizide  5 mg daily to manage fasting blood sugars while monitoring for hypoglycemia. - Reintroduce glipizide  5 mg once daily in the morning. - Check A1c today. - Continue Tresiba  31 units at night. - Continue Ozempic  2 mg weekly. - Monitor fasting blood sugars aiming for levels under 150, ideally under 130. - Follow up with endocrinology in October.  Chronic hypertension Blood pressure elevated today at 158/82 and 157/80, likely due to stress and timing of medication intake. Home readings typically around 130/60s. Currently on lisinopril  20 mg and hydrochlorothiazide  12.5 mg daily. - Continue lisinopril  20 mg daily. - Continue hydrochlorothiazide  12.5 mg daily. - Monitor blood pressure at home.  Hyperlipidemia associated with diabetes Chronic condition managed with Crestor  10 mg daily. ASCVD score is 27.8%. - Refill Crestor  (rosuvastatin ) 10 mg daily.  Gastroesophageal reflux disease (GERD) Chronic GERD managed with Protonix  40 mg daily. - Continue Protonix  40 mg daily.  General Health Maintenance Needs bone density scan and mammogram. Recommended pneumococcal and shingles vaccinations. - Order bone density scan. - Order mammogram. - Recommend pneumococcal vaccination.  Follow-Up Follow-up plans discussed for diabetes management and general health maintenance. - Follow up with endocrinology in October. - Schedule follow-up in 3 months for diabetes management.     Return in about 3 months (around 05/12/2024) for DM.         Rockie Agent, MD  [facility] [phone] (phone) [phone] (fax)  [facility]

## 2024-02-12 ENCOUNTER — Ambulatory Visit: Payer: Self-pay | Admitting: Family Medicine

## 2024-02-12 LAB — HEMOGLOBIN A1C
Est. average glucose Bld gHb Est-mCnc: 217 mg/dL
Hgb A1c MFr Bld: 9.2 % — ABNORMAL HIGH (ref 4.8–5.6)

## 2024-02-25 ENCOUNTER — Ambulatory Visit: Admitting: Family Medicine

## 2024-02-25 DIAGNOSIS — Z20822 Contact with and (suspected) exposure to covid-19: Secondary | ICD-10-CM | POA: Diagnosis not present

## 2024-02-25 DIAGNOSIS — J0100 Acute maxillary sinusitis, unspecified: Secondary | ICD-10-CM | POA: Diagnosis not present

## 2024-02-29 ENCOUNTER — Encounter: Payer: Self-pay | Admitting: Family Medicine

## 2024-02-29 ENCOUNTER — Ambulatory Visit (INDEPENDENT_AMBULATORY_CARE_PROVIDER_SITE_OTHER): Admitting: Family Medicine

## 2024-02-29 VITALS — BP 157/59 | HR 63 | Temp 98.1°F | Ht 59.0 in | Wt 219.8 lb

## 2024-02-29 DIAGNOSIS — J0140 Acute pansinusitis, unspecified: Secondary | ICD-10-CM

## 2024-02-29 DIAGNOSIS — R0981 Nasal congestion: Secondary | ICD-10-CM

## 2024-02-29 DIAGNOSIS — J029 Acute pharyngitis, unspecified: Secondary | ICD-10-CM

## 2024-02-29 LAB — POCT INFLUENZA A/B
Influenza A, POC: NEGATIVE
Influenza B, POC: NEGATIVE

## 2024-02-29 LAB — POC COVID19 BINAXNOW: SARS Coronavirus 2 Ag: NEGATIVE

## 2024-02-29 MED ORDER — PREDNISONE 20 MG PO TABS: 20.0000 mg | ORAL_TABLET | Freq: Every day | ORAL | 0 refills | Status: AC

## 2024-02-29 MED ORDER — METHYLPREDNISOLONE ACETATE 40 MG/ML IJ SUSP
40.0000 mg | Freq: Once | INTRAMUSCULAR | Status: AC
  Administered 2024-02-29: 40 mg via INTRAMUSCULAR

## 2024-02-29 NOTE — Progress Notes (Signed)
 [facility]    ACUTE VISIT   Patient: Alexis Lloyd   DOB: [birth year]   63 y.o. Female  MRN: [id number]   PCP: Sharma Coyer, MD  Chief Complaint  Patient presents with   URI    Patient is present with URI syx x one week. Seen at UC, covid negative and antibiotics given. Patient is not feeling much better    Subjective    HPI HPI     URI    Additional comments: Patient is present with URI syx x one week. Seen at UC, covid negative and antibiotics given. Patient is not feeling much better       Last edited by Cherry Chiquita HERO, CMA on 02/29/2024 10:52 AM.       Discussed the use of AI scribe software for clinical note transcription with the patient, who gave verbal consent to proceed.  History of Present Illness Alexis Lloyd is a 63 year old female who presents with sore throat, nasal congestion, and concern for sinus infection.  She has experienced a sore throat and nasal congestion for a week. She was evaluated at urgent care, tested negative for COVID-19, and was prescribed Augmentin for a sinus infection, which she started on February 25, 2024. Despite this treatment, she reports minimal improvement.  She describes her throat as feeling 'lower' and has been using throat lozenges and Tylenol  for relief, but continues to feel unwell. She notes soreness on the left side of her throat and hoarseness. No ear pain, but her ears feel very itchy and dry.  She has been sneezing and coughing, initially attributing it to a cold. She has been using her inhaler and reports dryness in her nasal passages, along with swelling in her left nostril.  She has a history of allergies to multiple medications including Allegra, amoxicillin, Bactrim, etodolac, penicillins, sulfa drugs, and Macrobid . She is currently on a 10-day course of Augmentin, with about five days remaining.     Medications: Outpatient Medications Prior to Visit  Medication Sig   Accu-Chek Softclix  Lancets lancets Use as instructed   albuterol  (VENTOLIN  HFA) 108 (90 Base) MCG/ACT inhaler INHALE 2 PUFFS BY MOUTH EVERY 6 HOURS AS NEEDED   amoxicillin-clavulanate (AUGMENTIN) 875-125 MG tablet Take 1 tablet by mouth 2 (two) times daily.   aspirin  EC 81 MG tablet Take 1 tablet (81 mg total) by mouth daily. Swallow whole.   baclofen  (LIORESAL ) 10 MG tablet Take 1 tablet (10 mg total) by mouth 3 (three) times daily.   Blood Glucose Monitoring Suppl (ACCU-CHEK GUIDE) w/Device KIT 1 Device by Does not apply route in the morning.   Continuous Glucose Receiver (FREESTYLE LIBRE 2 READER) DEVI 1 each by Does not apply route as directed.   Continuous Glucose Sensor (FREESTYLE LIBRE 3 PLUS SENSOR) MISC by Does not apply route. Change sensor every 15 days.   cyanocobalamin  (VITAMIN B12) 1000 MCG tablet Take 1,000 mcg by mouth daily.   diclofenac Sodium (VOLTAREN) 1 % GEL Apply 2 g topically as needed.   glipiZIDE  (GLUCOTROL ) 5 MG tablet Take 1 tablet (5 mg total) by mouth daily before breakfast.   glucose blood (ACCU-CHEK GUIDE TEST) test strip Use as instructed   glucose blood (ONETOUCH VERIO) test strip To check blood sugar once daily   hydrocortisone  cream 1 % Apply 1 Application topically 2 (two) times daily. External vaginal application   ibuprofen  (ADVIL ) 200 MG tablet Take 400 mg by mouth every 6 (six) hours as needed.  insulin  degludec (TRESIBA  FLEXTOUCH) 100 UNIT/ML FlexTouch Pen Inject 31 Units into the skin at bedtime.   Insulin  Pen Needle (PEN NEEDLES) 32G X 6 MM MISC 1 each by Does not apply route at bedtime.   lisinopril -hydrochlorothiazide  (ZESTORETIC ) 20-12.5 MG tablet Take 1 tablet by mouth daily.   montelukast  (SINGULAIR ) 10 MG tablet Take 1 tablet (10 mg total) by mouth daily.   omega-3 acid ethyl esters (LOVAZA) 1 g capsule Take 1 g by mouth daily.   OneTouch Delica Lancets 30G MISC 1 each by Does not apply route as directed.   pantoprazole  (PROTONIX ) 40 MG tablet Take 1 tablet by  mouth once daily   rosuvastatin  (CRESTOR ) 10 MG tablet Take 1 tablet (10 mg total) by mouth daily.   Semaglutide , 2 MG/DOSE, 8 MG/3ML SOPN Inject 2 mg as directed once a week.   No facility-administered medications prior to visit.        Objective    BP (!) 157/59 (BP Location: Right Arm, Patient Position: Sitting, Cuff Size: Normal)   Pulse 63   Temp 98.1 F (36.7 C) (Oral)   Ht 4' 11 (1.499 m)   Wt 219 lb 12.8 oz (99.7 kg)   SpO2 100%   BMI 44.39 kg/m    Physical Exam   Physical Exam GENERAL: Tired appearing but non-toxic. HEENT: Erythematous ear canals bilaterally, left tympanic membrane erythematous. Oropharyngeal erythema. Soft palate pale, no petechiae. Tenderness of maxillary and frontal sinuses bilaterally. CHEST: No wheezes or crackles. CARDIOVASCULAR: Regular rate and rhythm.   Results for orders placed or performed in visit on 02/29/24  POCT Influenza A/B  Result Value Ref Range   Influenza A, POC Negative Negative   Influenza B, POC Negative Negative    Assessment & Plan     Assessment and Plan Assessment & Plan Acute sinusitis with pharyngitis and nasal congestion Acute sinusitis with pharyngitis and nasal congestion persisting for one week. Symptoms include sore throat, nasal congestion, hoarseness, and sneezing. Examination reveals oropharyngeal erythema, tenderness of the maxillary and frontal sinuses bilaterally, and erythematous ear canals. Negative for COVID, influenza, and strep. Currently on Augmentin, but symptoms persist. Negative COVID and influenza tests in office today. Negative strep test in office  - Continue Augmentin for the full 10-day course as prescribed by UC provider  - Administered a 40mg  steroid injection today - Prescribe prednisone  20mg  daily to be taken while finishing the antibiotics starting 03/01/24, with the option to discontinue if symptoms improve, minimizing the risk of side effects such as elevated blood pressure - Advise  rest and hydration - Schedule regular follow-up      No follow-ups on file.        Rockie Agent, MD  [facility] [phone] (phone) [phone] (fax)  [facility]

## 2024-03-15 NOTE — Progress Notes (Signed)
 Alexis Lloyd                                          MRN: [id number]   03/15/2024   The VBCI Quality Team Specialist reviewed this patient medical record for the purposes of chart review for care gap closure. The following were reviewed: chart review for care gap closure-kidney health evaluation for diabetes:eGFR  and uACR.    VBCI Quality Team

## 2024-03-23 DIAGNOSIS — I152 Hypertension secondary to endocrine disorders: Secondary | ICD-10-CM | POA: Diagnosis not present

## 2024-03-23 DIAGNOSIS — E785 Hyperlipidemia, unspecified: Secondary | ICD-10-CM | POA: Diagnosis not present

## 2024-03-23 DIAGNOSIS — E1169 Type 2 diabetes mellitus with other specified complication: Secondary | ICD-10-CM | POA: Diagnosis not present

## 2024-03-23 DIAGNOSIS — E1165 Type 2 diabetes mellitus with hyperglycemia: Secondary | ICD-10-CM | POA: Diagnosis not present

## 2024-03-23 DIAGNOSIS — E1159 Type 2 diabetes mellitus with other circulatory complications: Secondary | ICD-10-CM | POA: Diagnosis not present

## 2024-03-26 DIAGNOSIS — E1165 Type 2 diabetes mellitus with hyperglycemia: Secondary | ICD-10-CM | POA: Diagnosis not present

## 2024-03-31 ENCOUNTER — Telehealth: Payer: Self-pay

## 2024-03-31 NOTE — Telephone Encounter (Signed)
 Copied from CRM #[id number]. Topic: Clinical - Medication Question >> Mar 31, 2024 10:33 AM Leonette SQUIBB wrote: Reason for CRM: patient called saying she needs to talk with the nurse pharm assistant about the medication assistance program and to re-enroll. Please call patient at [phone]

## 2024-04-01 ENCOUNTER — Other Ambulatory Visit: Payer: Self-pay

## 2024-04-01 NOTE — Progress Notes (Signed)
   04/01/2024 Name: KATALINA MAGRI MRN: [id number] DOB: 1960/09/21  Patient enrolled in Ozempic  patient assistance program from [employer] through 06/08/2024   [employer] has announced that they will no longer offer Ozempic  to [employer] beneficiaries through their Patient Assistance Program in 2026.    Outreach to patient today and provide this update.    Based on reported income, patient does meet criteria for Extra Help subsidy, however, she is currently enrolled in Tresiba  PAP through [employer] and will need to receive an LIS denial letter to continue receiving Tresiba  in 2026. Will route to PAP team for LIS application assistance at patient request   Counsel patient that Medicare's Annual Enrollment Period for 2026 starts 03/23/2024. Encourage patient to review deductibles formulary coverage and copay amounts between plans. Also share with patient the contact information for the [facility] Information Program [facility]) that can help with reviewing these Medicare plans  Shayonna Ocampo E. Marsh, PharmD Clinical Pharmacist [facility] [phone]

## 2024-04-04 ENCOUNTER — Telehealth: Payer: Self-pay

## 2024-04-04 NOTE — Telephone Encounter (Signed)
 Pt returned call, completed ExtraHelp application online over the phone. Pt informed to expect letter in mail and to share with provider, regardless if approved or denied.

## 2024-04-04 NOTE — Telephone Encounter (Signed)
 Called to complete ExtraHelp application, no answer. LVM

## 2024-04-04 NOTE — Progress Notes (Signed)
 TABIA LANDOWSKI                                          MRN: [id number]   04/04/2024   The VBCI Quality Team Specialist reviewed this patient medical record for the purposes of chart review for care gap closure. The following were reviewed: chart review for care gap closure-glycemic status assessment.    VBCI Quality Team

## 2024-04-04 NOTE — Progress Notes (Signed)
 Pharmacy student called pt, no answer, left VM requesting a call back

## 2024-04-08 DIAGNOSIS — E1165 Type 2 diabetes mellitus with hyperglycemia: Secondary | ICD-10-CM | POA: Diagnosis not present

## 2024-04-12 DIAGNOSIS — E1165 Type 2 diabetes mellitus with hyperglycemia: Secondary | ICD-10-CM | POA: Diagnosis not present

## 2024-04-13 ENCOUNTER — Other Ambulatory Visit: Payer: Self-pay | Admitting: Family Medicine

## 2024-04-13 DIAGNOSIS — J40 Bronchitis, not specified as acute or chronic: Secondary | ICD-10-CM

## 2024-04-14 ENCOUNTER — Telehealth: Payer: Self-pay

## 2024-04-14 NOTE — Telephone Encounter (Signed)
 Copied from CRM [id number]. 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 Please call patient and advise if this can be done.

## 2024-04-14 NOTE — Telephone Encounter (Signed)
 Written and placed in your inbox for signature.

## 2024-04-15 ENCOUNTER — Telehealth: Payer: Self-pay | Admitting: Family Medicine

## 2024-04-15 ENCOUNTER — Other Ambulatory Visit: Payer: Self-pay

## 2024-04-15 ENCOUNTER — Telehealth: Payer: Self-pay

## 2024-04-15 NOTE — Telephone Encounter (Signed)
 Please see encounter from 04/14/24.   Letter was completed and signed.   Will forward to CMA for instructions on how to pick up the letter

## 2024-04-15 NOTE — Telephone Encounter (Signed)
 Copied from CRM #[id number]. Topic: General - Other >> Apr 15, 2024  8:06 AM [name] wrote: Reason for CRM: The patient has called for an update on their previously requested assistance letter for [employer]. The patient has received a bill for $818.22 and shares that due to their critical illnesses that they would like assistance from their gas company in coverage. In order to receive assistance the patient will need documentation. Please contact further when possible. The patient has stressed the urgency of their request

## 2024-04-15 NOTE — Progress Notes (Unsigned)
 S:     Reason for visit: ?  Alexis Lloyd is a 63 y.o. female with a history of diabetes ({Blank single:19197::type 1,type 2,unclear type}), who presents today for {lzvisittype:31254} diabetes {visit type:33775} pharmacotherapy visit.? Pertinent PMH also includes ***.  Known DM Complications: {DM complications:33329}   Care Team: Primary Care Provider: Sharma Coyer, MD  At last visit, ***.   Patient reports Diabetes was diagnosed in ***.   Current diabetes medications include: *** Previous diabetes medications include: *** Current hypertension medications include: *** Current hyperlipidemia medications include: ***  Patient reports adherence to taking all medications as prescribed.  *** Patient denies adherence with medications, reports missing *** medications *** times per week, on average.  Have you been experiencing any side effects to the medications prescribed? {YES NO:22349} Do you have any problems obtaining medications due to transportation or finances? {YES [id number] Insurance coverage: ***  Current medication access support: ***  Patient {Actions; denies-reports:120008} hypoglycemic events.  Reported home fasting blood sugars: ***  Reported 2 hour post-meal/random blood sugars: ***.  Patient {Actions; denies-reports:120008} nocturia (nighttime urination).  Patient {Actions; denies-reports:120008} neuropathy (nerve pain). Patient {Actions; denies-reports:120008} visual changes. Patient {Actions; denies-reports:120008} self foot exams.   Patient reported dietary habits: Eats *** meals/day Breakfast: *** Lunch: *** Dinner: *** Snacks: *** Drinks: ***  Patient-reported exercise habits: *** DM Prevention:  Statin: {Blank single:19197::***,Taking,Not taking,Intolerant to,Declines}; {Blank single:19197::low intensity,moderate intensity,high intensity,n/a}.?  ACE/ARB: {Blank single:19197::yes,no}; *** History of chronic  kidney disease? {Blank single:19197::yes,no} Last urinary albumin/creatinine ratio:  Lab Results  Component Value Date   MICRALBCREAT 11 03/09/2023   MICRALBCREAT <7 11/29/2021   MICRALBCREAT 62 (H) 08/30/2021   Last eye exam:  Lab Results  Component Value Date   HMDIABEYEEXA No Retinopathy 01/06/2024   Lab Results  Component Value Date   HMDIABEYEEXA No Retinopathy 01/06/2024   Last foot exam: 07/16/2023 Tobacco Use:  Tobacco Use: Low Risk  (03/23/2024)   Received from [facility]   Patient History    Smoking Tobacco Use: Never    Smokeless Tobacco Use: Never    Passive Exposure: Not on file  Recent Concern: Tobacco Use - Medium Risk (02/29/2024)   Patient History    Smoking Tobacco Use: Former    Smokeless Tobacco Use: Never    Passive Exposure: Not on file   O:  {CGM reports:33570} 7 day average blood glucose: ***  Libre3 CGM Download today *** % Time CGM is active: ***% Average Glucose: *** mg/dL Glucose Management Indicator: ***  Glucose Variability: ***% (goal <36%) Time in Goal:  - Time in range 70-180: ***% - Time above range: ***% - Time below range: ***% Observed patterns:  Vitals:  Wt Readings from Last 3 Encounters:  02/29/24 219 lb 12.8 oz (99.7 kg)  02/11/24 217 lb 1.6 oz (98.5 kg)  01/11/24 218 lb 8 oz (99.1 kg)   BP Readings from Last 3 Encounters:  02/29/24 (!) 157/59  02/11/24 (!) 142/82  01/11/24 130/62   Pulse Readings from Last 3 Encounters:  02/29/24 63  02/11/24 71  01/11/24 64     Labs:?  Lab Results  Component Value Date   HGBA1C 9.2 (H) 02/11/2024   HGBA1C 8.5 (H) 10/14/2023   HGBA1C 8.0 (H) 07/16/2023   GLUCOSE 132 (H) 01/05/2024   MICRALBCREAT 11 03/09/2023   MICRALBCREAT <7 11/29/2021   MICRALBCREAT 62 (H) 08/30/2021   CREATININE 0.88 01/05/2024   CREATININE 0.62 10/02/2023   CREATININE 1.05 (H) 03/09/2023    Lab  Results  Component Value Date   CHOL 145 10/02/2023   LDLCALC 90 10/02/2023    LDLCALC 80 03/09/2023   LDLCALC 98 05/29/2022   HDL 29 (L) 10/02/2023   TRIG 148 10/02/2023   TRIG 269 (H) 03/09/2023   TRIG 142 05/29/2022   ALT 14 10/02/2023   ALT 18 03/09/2023   AST 8 10/02/2023   AST 11 03/09/2023      Chemistry      Component Value Date/Time   NA 143 01/05/2024 1124   NA 141 04/14/2014 0925   K 3.6 01/05/2024 1124   K 4.0 04/14/2014 0925   CL 103 01/05/2024 1124   CL 103 04/14/2014 0925   CO2 24 01/05/2024 1124   CO2 29 04/14/2014 0925   BUN 16 01/05/2024 1124   BUN 17 04/14/2014 0925   CREATININE 0.88 01/05/2024 1124   CREATININE 0.75 04/14/2014 0925   GLU 147 08/03/2014 0000      Component Value Date/Time   CALCIUM  9.7 01/05/2024 1124   CALCIUM  9.1 04/14/2014 0925   ALKPHOS 87 10/02/2023 1055   ALKPHOS 68 11/02/2013 1007   AST 8 10/02/2023 1055   AST 15 11/02/2013 1007   ALT 14 10/02/2023 1055   ALT 50 11/02/2013 1007   BILITOT 0.3 10/02/2023 1055   BILITOT 0.4 11/02/2013 1007       The 10-year ASCVD risk score (Arnett DK, et al., 2019) is: 17.4%  Lab Results  Component Value Date   MICRALBCREAT 11 03/09/2023   MICRALBCREAT <7 11/29/2021   MICRALBCREAT 62 (H) 08/30/2021    A/P: Diabetes currently *** with a most recent A1c of *** on ***, which is {DESC; UP/DOWN/UNCHANGED:18711} from *** on ***. Patient is *** able to verbalize appropriate hypoglycemia management plan. Medication adherence appears ***. Control is suboptimal due to ***. -{Meds adjust:18428} basal insulin  {basal insulins:33573}  *** units daily.  -{Meds adjust:18428} rapid insulin  {bolus insulin :33574} ***.  -{Meds adjust:18428} GLP-1 {GLP1 options:33572} *** mg .  -{Meds adjust:18428} SGLT2-I {SGLT2i options:33571}*** mg daily.  -{Meds adjust:18428} metformin  ***.  -Patient educated on purpose, proper use, and potential adverse effects of ***.  -Extensively discussed pathophysiology of diabetes, recommended lifestyle interventions, dietary effects on blood sugar  control.  -Counseled on s/sx of and management of hypoglycemia.  -Next A1c anticipated ***.   ASCVD risk - {primary/secondary:33575} prevention in patient with diabetes. Last LDL is *** mg/dL, not at goal of <29 *** mg/dL. ASCVD risk factors include *** and 10-year ASCVD risk score of ***. {Desc; low/moderate/high:110033} intensity statin indicated.  -{Meds adjust:18428} {statin therapies:33576} *** mg daily.  -{Meds adjust:18428} ezetimibe 10 mg daily   Hypertension longstanding *** currently ***. Blood pressure goal of <130/80 *** mmHg. Medication adherence ***. Blood pressure control is suboptimal due to ***. -{Meds adjust:18428} *** mg ***.  {pharmacisttime:33368}  Follow-up:  Pharmacist on *** PCP clinic visit on ***  Peyton CHARLENA Ferries, PharmD, CPP Clinical Pharmacist [facility] [phone]

## 2024-04-15 NOTE — Telephone Encounter (Signed)
 Called and LVM to advise patient that letter is at front desk in [address].  E2C2 please advise patient of this if she returns call

## 2024-04-15 NOTE — Telephone Encounter (Signed)
 Completed my portions and left in Dr. Feliberto box up front for signature.

## 2024-04-15 NOTE — Telephone Encounter (Signed)
 Alexis Lloyd stopped by to pick up her paperwork, but she stated that more paperwork needs to be filled out. She asked that the paperwork be faxed to [employer] of [place], [place] but she does not know their fax number. I informed her to call them to get their fax number.

## 2024-04-20 ENCOUNTER — Ambulatory Visit (INDEPENDENT_AMBULATORY_CARE_PROVIDER_SITE_OTHER): Admitting: Family Medicine

## 2024-04-20 ENCOUNTER — Encounter: Payer: Self-pay | Admitting: Family Medicine

## 2024-04-20 VITALS — BP 143/79 | HR 82 | Temp 98.4°F | Ht 59.5 in | Wt 228.3 lb

## 2024-04-20 DIAGNOSIS — E1165 Type 2 diabetes mellitus with hyperglycemia: Secondary | ICD-10-CM | POA: Diagnosis not present

## 2024-04-20 DIAGNOSIS — Z794 Long term (current) use of insulin: Secondary | ICD-10-CM | POA: Diagnosis not present

## 2024-04-20 DIAGNOSIS — I152 Hypertension secondary to endocrine disorders: Secondary | ICD-10-CM | POA: Diagnosis not present

## 2024-04-20 DIAGNOSIS — E1159 Type 2 diabetes mellitus with other circulatory complications: Secondary | ICD-10-CM | POA: Diagnosis not present

## 2024-04-20 LAB — POCT GLYCOSYLATED HEMOGLOBIN (HGB A1C): Hemoglobin A1C: 8.7 % — AB (ref 4.0–5.6)

## 2024-04-20 MED ORDER — TIRZEPATIDE 5 MG/0.5ML ~~LOC~~ SOAJ: 5.0000 mg | SUBCUTANEOUS | 2 refills | Status: DC

## 2024-04-20 MED ORDER — TIRZEPATIDE 2.5 MG/0.5ML ~~LOC~~ SOAJ: 2.5000 mg | SUBCUTANEOUS | 0 refills | Status: DC

## 2024-04-20 NOTE — Progress Notes (Signed)
 Established patient visit   Patient: Alexis Lloyd   DOB: June 04, 1961   63 y.o. Female  MRN: [id number] Visit Date: 04/20/2024  Today's healthcare provider: LAURAINE LOISE BUOY, DO   Chief Complaint  Patient presents with   Follow-up    Patient is just here to get her A1C done, she has an appointment with Dr. Lang on 05/12/2024.   Subjective    HPI Alexis Lloyd is a 63 year old female with diabetes and hypertension who presents with weight gain and elevated A1c.  She has experienced an 18-pound weight gain over the past two months, partly due to a recent illness requiring prednisone  treatment, during which she gained nine pounds. Her A1c is currently 8.7.  She was previously on Ozempic  but discontinued it due to side effects, including a persistent salty taste in her mouth. She has not taken Ozempic  since last Wednesday and did not take it today. Despite being on Ozempic , she experienced weight gain and elevated blood sugars.  Her current medications include Tresiba  at 31 units, which she is hesitant to increase to 56 units, as recommended by endocrinology. She has stopped taking glipizide  due to low blood sugar episodes occurring daily between 12 and 1 PM, with levels dropping to the upper 60s.  She monitors her blood pressure at home, which typically runs around 130/60, and she uses a CGM for glucose monitoring. She is on Medicare and patient assistance for Ozempic  and Tresiba  will no longer be available.  Her diet includes non-starchy vegetables, greens, and cabbage, but she acknowledges a preference for potatoes and bacon, which she is trying to limit. She consumes apples and occasionally bananas, but avoids grapes and other high-sugar fruits.      Medications: Outpatient Medications Prior to Visit  Medication Sig Note   Accu-Chek Softclix Lancets lancets Use as instructed    aspirin  EC 81 MG tablet Take 1 tablet (81 mg total) by mouth daily. Swallow whole.    Blood  Glucose Monitoring Suppl (ACCU-CHEK GUIDE) w/Device KIT 1 Device by Does not apply route in the morning.    Continuous Glucose Sensor (FREESTYLE LIBRE 3 PLUS SENSOR) MISC by Does not apply route. Change sensor every 15 days.    cyanocobalamin  (VITAMIN B12) 1000 MCG tablet Take 1,000 mcg by mouth daily.    diclofenac Sodium (VOLTAREN) 1 % GEL Apply 2 g topically as needed.    glucose blood (ACCU-CHEK GUIDE TEST) test strip Use as instructed    glucose blood (ONETOUCH VERIO) test strip To check blood sugar once daily    hydrocortisone  cream 1 % Apply 1 Application topically 2 (two) times daily. External vaginal application    ibuprofen  (ADVIL ) 200 MG tablet Take 400 mg by mouth every 6 (six) hours as needed.    insulin  degludec (TRESIBA  FLEXTOUCH) 100 UNIT/ML FlexTouch Pen Inject 46 Units into the skin at bedtime. Every 3 days start increase units by 2 up to max of 52 units daily until fasting glucose is less than 150    Insulin  Pen Needle (PEN NEEDLES) 32G X 6 MM MISC 1 each by Does not apply route at bedtime.    lisinopril -hydrochlorothiazide  (ZESTORETIC ) 20-12.5 MG tablet Take 1 tablet by mouth daily.    montelukast  (SINGULAIR ) 10 MG tablet Take 1 tablet by mouth once daily    omega-3 acid ethyl esters (LOVAZA) 1 g capsule Take 1 g by mouth daily.    OneTouch Delica Lancets 30G MISC 1 each by Does  not apply route as directed.    rosuvastatin  (CRESTOR ) 10 MG tablet Take 1 tablet (10 mg total) by mouth daily.    [DISCONTINUED] albuterol  (VENTOLIN  HFA) 108 (90 Base) MCG/ACT inhaler INHALE 2 PUFFS BY MOUTH EVERY 6 HOURS AS NEEDED    [DISCONTINUED] amoxicillin-clavulanate (AUGMENTIN) 875-125 MG tablet Take 1 tablet by mouth 2 (two) times daily.    [DISCONTINUED] baclofen  (LIORESAL ) 10 MG tablet Take 1 tablet (10 mg total) by mouth 3 (three) times daily.    [DISCONTINUED] Continuous Glucose Receiver (FREESTYLE LIBRE 2 READER) DEVI 1 each by Does not apply route as directed. 04/20/2024: to 3 plus    [DISCONTINUED] glipiZIDE  (GLUCOTROL ) 5 MG tablet Take 1 tablet (5 mg total) by mouth daily before breakfast.    [DISCONTINUED] pantoprazole  (PROTONIX ) 40 MG tablet Take 1 tablet by mouth once daily    [DISCONTINUED] Semaglutide , 2 MG/DOSE, 8 MG/3ML SOPN Inject 2 mg as directed once a week. 04/20/2024: discontinued - last dose two weeks ago   No facility-administered medications prior to visit.        Objective    BP (!) 143/79 (BP Location: Right Arm, Patient Position: Sitting, Cuff Size: Large)   Pulse 82   Temp 98.4 F (36.9 C) (Oral)   Ht 4' 11.5 (1.511 m)   Wt 228 lb 4.8 oz (103.6 kg)   SpO2 97%   BMI 45.34 kg/m     Physical Exam Vitals and nursing note reviewed.  Constitutional:      General: She is not in acute distress.    Appearance: Normal appearance.  HENT:     Head: Normocephalic and atraumatic.  Eyes:     General: No scleral icterus.    Conjunctiva/sclera: Conjunctivae normal.  Cardiovascular:     Rate and Rhythm: Normal rate.  Pulmonary:     Effort: Pulmonary effort is normal.  Neurological:     Mental Status: She is alert and oriented to person, place, and time. Mental status is at baseline.  Psychiatric:        Mood and Affect: Mood normal.        Behavior: Behavior normal.      Results for orders placed or performed in visit on 04/20/24  Microalbumin / creatinine urine ratio  Result Value Ref Range   Creatinine, Urine 174.5 Not Estab. mg/dL   Microalbumin, Urine 899.4 Not Estab. ug/mL   Microalb/Creat Ratio 58 (H) 0 - 29 mg/g creat  Specimen status report  Result Value Ref Range   specimen status report Comment   POCT HgB A1C  Result Value Ref Range   Hemoglobin A1C 8.7 (A) 4.0 - 5.6 %   HbA1c POC (<> result, manual entry)     HbA1c, POC (prediabetic range)     HbA1c, POC (controlled diabetic range)      Assessment & Plan    Uncontrolled type 2 diabetes mellitus with hyperglycemia, with long-term current use of insulin  (HCC) -     POCT  glycosylated hemoglobin (Hb A1C) -     Microalbumin / creatinine urine ratio -     Tirzepatide ; Inject 2.5 mg into the skin once a week.  Dispense: 2 mL; Refill: 0 -     Amb Referral to Nutrition and Diabetic Education -     Specimen status report  Hypertension associated with diabetes (HCC)     Uncontrolled type 2 diabetes mellitus with hyperglycemia, with long-term current use of insulin  A1c elevated at 8.7% today. Weight gain after discontinuing Ozempic . Hypoglycemia likely  due to glipizide . Mounjaro  considered for better glycemic control and weight management. - Started Mounjaro  2.5 mg (sample), seeking insurance approval for 5 mg. - Discontinued glipizide . - Increase Tresiba  by 2 units per night for 3 nights, reassess. Continue increasing by 2 units every 3 nights until blood sugars <150 mg/dL without hypoglycemia. Max dose 42 units. - Ordered urine microalbumin creatinine ratio. - Provided dietary counseling on reducing simple carbohydrates and sugars, increasing non-starchy vegetables and whole grains. - Referred to diabetic education for more in-depth dietary counseling.  Hypertension associated with diabetes Chronic, elevated today.  Home blood pressure readings in 130s/60s. Emphasized importance of monitoring and healthy lifestyle. - Continue current antihypertensive regimen. - Monitor blood pressure regularly at home.  Bring log to next visit.    Return in about 22 days (around 05/12/2024) for Follow-up with PCP as scheduled.      I discussed the assessment and treatment plan with the patient  The patient was provided an opportunity to ask questions and all were answered. The patient agreed with the plan and demonstrated an understanding of the instructions.   The patient was advised to call back or seek an in-person evaluation if the symptoms worsen or if the condition fails to improve as anticipated.    LAURAINE LOISE BUOY, DO  [facility] [phone] (phone) [phone] (fax)  [facility]

## 2024-04-20 NOTE — Patient Instructions (Addendum)
 What's the Medicare Prescription Payment Plan?  The Medicare Prescription Payment Plan is a payment option that works with your current drug  coverage to help you manage your out-of-pocket costs for drugs covered by your plan by  spreading them across the calendar year (January-December). Anyone with a Medicare drug plan or Medicare health plan with drug coverage (like a Medicare Advantage Plan with drug coverage) can use this payment option. All plans offer this payment option, and participation is voluntary.  If you select this payment option, each month you'll continue to pay your plan premium (if you have one), and you'll get a bill from your health or drug plan to pay for your prescription drugs (instead of paying the pharmacy). There's no cost to participate in the [facility] Prescription Payment Plan.    Stop glipizide .  Tresiba  - increase 2 units per night for three nights and monitor for both low and high blood sugars.  If blood sugars remain high, without low blood sugars, go ahead and increase 2 more units every 3 nights until your blood sugars remain less than 150 and you are not experiencing low blood sugars (<70).  - maximum Tresiba  dose - 42 units per night (stop increasing if you reach this)

## 2024-04-21 ENCOUNTER — Telehealth: Payer: Self-pay | Admitting: Family Medicine

## 2024-04-21 NOTE — Telephone Encounter (Signed)
Form has been completed and mailed  

## 2024-04-22 ENCOUNTER — Other Ambulatory Visit: Payer: Self-pay

## 2024-04-22 LAB — MICROALBUMIN / CREATININE URINE RATIO
Creatinine, Urine: 174.5 mg/dL
Microalb/Creat Ratio: 58 mg/g{creat} — ABNORMAL HIGH (ref 0–29)
Microalbumin, Urine: 100.5 ug/mL

## 2024-04-22 LAB — SPECIMEN STATUS REPORT

## 2024-04-22 NOTE — Progress Notes (Unsigned)
 S:     Reason for visit: ?  Alexis Lloyd is a 63 y.o. female with a history of diabetes ({Blank single:19197::type 1,type 2,unclear type}), who presents today for {lzvisittype:31254} diabetes {visit type:33775} pharmacotherapy visit.? Pertinent PMH also includes ***.  Known DM Complications: {DM complications:33329}   Care Team: Primary Care Provider: Sharma Coyer, MD  At last visit, ***.   Patient reports Diabetes was diagnosed in ***.   Current diabetes medications include: *** Previous diabetes medications include: *** Current hypertension medications include: *** Current hyperlipidemia medications include: ***  Patient reports adherence to taking all medications as prescribed.  *** Patient denies adherence with medications, reports missing *** medications *** times per week, on average.  Have you been experiencing any side effects to the medications prescribed? {YES NO:22349} Do you have any problems obtaining medications due to transportation or finances? {YES [id number] Insurance coverage: ***  Current medication access support: ***  Patient {Actions; denies-reports:120008} hypoglycemic events.  Reported home fasting blood sugars: ***  Reported 2 hour post-meal/random blood sugars: ***.  Patient {Actions; denies-reports:120008} nocturia (nighttime urination).  Patient {Actions; denies-reports:120008} neuropathy (nerve pain). Patient {Actions; denies-reports:120008} visual changes. Patient {Actions; denies-reports:120008} self foot exams.   Patient reported dietary habits: Eats *** meals/day Breakfast: *** Lunch: *** Dinner: *** Snacks: *** Drinks: ***  Patient-reported exercise habits: *** DM Prevention:  Statin: {Blank single:19197::***,Taking,Not taking,Intolerant to,Declines}; {Blank single:19197::low intensity,moderate intensity,high intensity,n/a}.?  ACE/ARB: {Blank single:19197::yes,no}; *** History of chronic  kidney disease? {Blank single:19197::yes,no} Last urinary albumin/creatinine ratio:  Lab Results  Component Value Date   MICRALBCREAT 58 (H) 04/20/2024   MICRALBCREAT 11 03/09/2023   MICRALBCREAT <7 11/29/2021   MICRALBCREAT 62 (H) 08/30/2021   Last eye exam:  Lab Results  Component Value Date   HMDIABEYEEXA No Retinopathy 01/06/2024   Lab Results  Component Value Date   HMDIABEYEEXA No Retinopathy 01/06/2024   Last foot exam: 07/16/2023 Tobacco Use:  Tobacco Use: Medium Risk (04/20/2024)   Patient History    Smoking Tobacco Use: Former    Smokeless Tobacco Use: Never    Passive Exposure: Not on file   O:  {CGM reports:33570} 7 day average blood glucose: ***  Libre3 CGM Download today *** % Time CGM is active: ***% Average Glucose: *** mg/dL Glucose Management Indicator: ***  Glucose Variability: ***% (goal <36%) Time in Goal:  - Time in range 70-180: ***% - Time above range: ***% - Time below range: ***% Observed patterns:  Vitals:  Wt Readings from Last 3 Encounters:  04/20/24 228 lb 4.8 oz (103.6 kg)  02/29/24 219 lb 12.8 oz (99.7 kg)  02/11/24 217 lb 1.6 oz (98.5 kg)   BP Readings from Last 3 Encounters:  04/20/24 (!) 143/79  02/29/24 (!) 157/59  02/11/24 (!) 142/82   Pulse Readings from Last 3 Encounters:  04/20/24 82  02/29/24 63  02/11/24 71     Labs:?  Lab Results  Component Value Date   HGBA1C 8.7 (A) 04/20/2024   HGBA1C 9.2 (H) 02/11/2024   HGBA1C 8.5 (H) 10/14/2023   GLUCOSE 132 (H) 01/05/2024   MICRALBCREAT 58 (H) 04/20/2024   MICRALBCREAT 11 03/09/2023   MICRALBCREAT <7 11/29/2021   CREATININE 0.88 01/05/2024   CREATININE 0.62 10/02/2023   CREATININE 1.05 (H) 03/09/2023    Lab Results  Component Value Date   CHOL 145 10/02/2023   LDLCALC 90 10/02/2023   LDLCALC 80 03/09/2023   LDLCALC 98 05/29/2022   HDL 29 (L) 10/02/2023   TRIG 148 10/02/2023  TRIG 269 (H) 03/09/2023   TRIG 142 05/29/2022   ALT 14 10/02/2023   ALT  18 03/09/2023   AST 8 10/02/2023   AST 11 03/09/2023      Chemistry      Component Value Date/Time   NA 143 01/05/2024 1124   NA 141 04/14/2014 0925   K 3.6 01/05/2024 1124   K 4.0 04/14/2014 0925   CL 103 01/05/2024 1124   CL 103 04/14/2014 0925   CO2 24 01/05/2024 1124   CO2 29 04/14/2014 0925   BUN 16 01/05/2024 1124   BUN 17 04/14/2014 0925   CREATININE 0.88 01/05/2024 1124   CREATININE 0.75 04/14/2014 0925   GLU 147 08/03/2014 0000      Component Value Date/Time   CALCIUM  9.7 01/05/2024 1124   CALCIUM  9.1 04/14/2014 0925   ALKPHOS 87 10/02/2023 1055   ALKPHOS 68 11/02/2013 1007   AST 8 10/02/2023 1055   AST 15 11/02/2013 1007   ALT 14 10/02/2023 1055   ALT 50 11/02/2013 1007   BILITOT 0.3 10/02/2023 1055   BILITOT 0.4 11/02/2013 1007       The 10-year ASCVD risk score (Arnett DK, et al., 2019) is: 22.9%  Lab Results  Component Value Date   MICRALBCREAT 58 (H) 04/20/2024   MICRALBCREAT 11 03/09/2023   MICRALBCREAT <7 11/29/2021   MICRALBCREAT 62 (H) 08/30/2021    A/P: Diabetes currently *** with a most recent A1c of *** on ***, which is {DESC; UP/DOWN/UNCHANGED:18711} from *** on ***. Patient is *** able to verbalize appropriate hypoglycemia management plan. Medication adherence appears ***. Control is suboptimal due to ***. -{Meds adjust:18428} basal insulin  {basal insulins:33573}  *** units daily.  -{Meds adjust:18428} rapid insulin  {bolus insulin :33574} ***.  -{Meds adjust:18428} GLP-1 {GLP1 options:33572} *** mg .  -{Meds adjust:18428} SGLT2-I {SGLT2i options:33571}*** mg daily.  -{Meds adjust:18428} metformin  ***.  -Patient educated on purpose, proper use, and potential adverse effects of ***.  -Extensively discussed pathophysiology of diabetes, recommended lifestyle interventions, dietary effects on blood sugar control.  -Counseled on s/sx of and management of hypoglycemia.  -Next A1c anticipated ***.   ASCVD risk - {primary/secondary:33575}  prevention in patient with diabetes. Last LDL is *** mg/dL, not at goal of <29 *** mg/dL. ASCVD risk factors include *** and 10-year ASCVD risk score of ***. {Desc; low/moderate/high:110033} intensity statin indicated.  -{Meds adjust:18428} {statin therapies:33576} *** mg daily.  -{Meds adjust:18428} ezetimibe 10 mg daily   Hypertension longstanding *** currently ***. Blood pressure goal of <130/80 *** mmHg. Medication adherence ***. Blood pressure control is suboptimal due to ***. -{Meds adjust:18428} *** mg ***.  {pharmacisttime:33368}  Follow-up:  Pharmacist on *** PCP clinic visit on ***  Peyton CHARLENA Ferries, PharmD, CPP Clinical Pharmacist [facility] [phone]

## 2024-04-25 ENCOUNTER — Telehealth: Payer: Self-pay

## 2024-04-25 ENCOUNTER — Other Ambulatory Visit: Payer: Self-pay | Admitting: Family Medicine

## 2024-04-25 DIAGNOSIS — K219 Gastro-esophageal reflux disease without esophagitis: Secondary | ICD-10-CM

## 2024-04-25 NOTE — Telephone Encounter (Cosign Needed)
 Brief Telephone Documentation Summary of Call: Contacted patient to reschedule pharmacy appointment from last week. Patient reports she has been doing well since starting Mounjaro  therapy. She has been unable to go to the pharmacy to check on the cost of this medication (currently finishing her medication samples). She will plan to check on this prior to follow up. She is also awaiting LIS determination at this time.   Follow Up: Patient given direct line for further questions/concerns.  Shelda Truby E. Marsh, PharmD Clinical Pharmacist [facility] [phone]

## 2024-04-26 NOTE — Telephone Encounter (Signed)
 Patient reported that Mounjaro  is ~$300 on insurance. She has a couple weeks left of samples at this time. Will discuss plan at follow up appointment on 05/12/24.  Ewald Beg E. Marsh, PharmD, CPP Clinical Pharmacist [facility] [phone]

## 2024-04-29 NOTE — Telephone Encounter (Signed)
 Patient is calling to follow up on letter to be sent to [employer] stating that she and someone in her household have chronic health conditions. Please advise

## 2024-05-03 ENCOUNTER — Telehealth: Payer: Self-pay | Admitting: Family Medicine

## 2024-05-09 ENCOUNTER — Other Ambulatory Visit: Payer: Self-pay | Admitting: Family Medicine

## 2024-05-09 ENCOUNTER — Ambulatory Visit: Payer: Self-pay | Admitting: Family Medicine

## 2024-05-09 DIAGNOSIS — J4520 Mild intermittent asthma, uncomplicated: Secondary | ICD-10-CM

## 2024-05-09 DIAGNOSIS — R0602 Shortness of breath: Secondary | ICD-10-CM

## 2024-05-09 DIAGNOSIS — R051 Acute cough: Secondary | ICD-10-CM

## 2024-05-12 ENCOUNTER — Ambulatory Visit

## 2024-05-12 ENCOUNTER — Ambulatory Visit: Admitting: Family Medicine

## 2024-05-12 ENCOUNTER — Encounter: Payer: Self-pay | Admitting: Family Medicine

## 2024-05-12 VITALS — BP 138/80 | HR 82 | Temp 98.2°F | Ht 59.5 in | Wt 225.8 lb

## 2024-05-12 DIAGNOSIS — L732 Hidradenitis suppurativa: Secondary | ICD-10-CM

## 2024-05-12 DIAGNOSIS — Z794 Long term (current) use of insulin: Secondary | ICD-10-CM | POA: Diagnosis not present

## 2024-05-12 DIAGNOSIS — E1159 Type 2 diabetes mellitus with other circulatory complications: Secondary | ICD-10-CM | POA: Diagnosis not present

## 2024-05-12 DIAGNOSIS — K219 Gastro-esophageal reflux disease without esophagitis: Secondary | ICD-10-CM

## 2024-05-12 DIAGNOSIS — E1165 Type 2 diabetes mellitus with hyperglycemia: Secondary | ICD-10-CM

## 2024-05-12 DIAGNOSIS — Z853 Personal history of malignant neoplasm of breast: Secondary | ICD-10-CM

## 2024-05-12 DIAGNOSIS — R32 Unspecified urinary incontinence: Secondary | ICD-10-CM | POA: Diagnosis not present

## 2024-05-12 DIAGNOSIS — Z13820 Encounter for screening for osteoporosis: Secondary | ICD-10-CM

## 2024-05-12 DIAGNOSIS — R319 Hematuria, unspecified: Secondary | ICD-10-CM

## 2024-05-12 DIAGNOSIS — N3941 Urge incontinence: Secondary | ICD-10-CM | POA: Diagnosis not present

## 2024-05-12 DIAGNOSIS — I152 Hypertension secondary to endocrine disorders: Secondary | ICD-10-CM

## 2024-05-12 DIAGNOSIS — T753XXD Motion sickness, subsequent encounter: Secondary | ICD-10-CM

## 2024-05-12 DIAGNOSIS — E1169 Type 2 diabetes mellitus with other specified complication: Secondary | ICD-10-CM | POA: Diagnosis not present

## 2024-05-12 DIAGNOSIS — E1143 Type 2 diabetes mellitus with diabetic autonomic (poly)neuropathy: Secondary | ICD-10-CM

## 2024-05-12 DIAGNOSIS — Z1231 Encounter for screening mammogram for malignant neoplasm of breast: Secondary | ICD-10-CM

## 2024-05-12 LAB — POCT URINALYSIS DIPSTICK
Bilirubin, UA: NEGATIVE
Glucose, UA: POSITIVE — AB
Ketones, UA: NEGATIVE
Leukocytes, UA: NEGATIVE
Nitrite, UA: POSITIVE
Protein, UA: POSITIVE — AB
Spec Grav, UA: 1.02 (ref 1.010–1.025)
Urobilinogen, UA: 0.2 U/dL
pH, UA: 5 (ref 5.0–8.0)

## 2024-05-12 MED ORDER — SOLIFENACIN SUCCINATE 5 MG PO TABS: 5.0000 mg | ORAL_TABLET | Freq: Every day | ORAL | 1 refills | Status: AC

## 2024-05-12 MED ORDER — DOXYCYCLINE HYCLATE 100 MG PO TABS: 100.0000 mg | ORAL_TABLET | Freq: Two times a day (BID) | ORAL | 0 refills | Status: AC

## 2024-05-12 MED ORDER — SCOPOLAMINE 1 MG/3DAYS TD PT72: 1.0000 | MEDICATED_PATCH | TRANSDERMAL | 3 refills | Status: DC

## 2024-05-12 NOTE — Patient Instructions (Signed)
 To keep you healthy, please keep in mind the following health maintenance items that you are due for:   Health Maintenance Due  Topic Date Due   Pneumococcal Vaccine: 50+ Years (2 of 2 - PCV) 08/05/2016   Zoster Vaccines- Shingrix  (2 of 2) 10/10/2021   Mammogram  12/17/2022   Bone Density Scan  Never done   Cervical Cancer Screening (HPV/Pap Cotest)  05/17/2024     Best Wishes,   Dr. Lang

## 2024-05-12 NOTE — Progress Notes (Unsigned)
 Established patient visit   Patient: Alexis Lloyd   DOB: Jan 03, 1961   63 y.o. Female  MRN: [id number] Visit Date: 05/12/2024  Today's healthcare provider: Rockie Agent, MD   Chief Complaint  Patient presents with   Medical Management of Chronic Issues    Patient is present for follow up DM with PCP. Patient is doing well on mounjaro , but talked with insurance and will be $300 oop per month  Patient is going on a cruise and requests scoplamine patches to help with nausea on boat    boils    Patient has been dealing with recurrent boils for about 1 year, gets them in groin and in buttock area.    urine incontinence    Patient reports incontinence x 3 days, no frequency or dysuria   Subjective     HPI     Medical Management of Chronic Issues    Additional comments: Patient is present for follow up DM with PCP. Patient is doing well on mounjaro , but talked with insurance and will be $300 oop per month  Patient is going on a cruise and requests scoplamine patches to help with nausea on boat         boils    Additional comments: Patient has been dealing with recurrent boils for about 1 year, gets them in groin and in buttock area.         urine incontinence    Additional comments: Patient reports incontinence x 3 days, no frequency or dysuria      Last edited by Cherry Chiquita HERO, CMA on 05/12/2024 10:30 AM.       Discussed the use of AI scribe software for clinical note transcription with the patient, who gave verbal consent to proceed.  History of Present Illness Alexis Lloyd is a 63 year old female with hypertension and type two diabetes who presents with recurrent boils and urinary incontinence.  She has recurrent boils in the groin and buttock area, which sometimes bleed when scratched. She has not had boils under her arms since undergoing lymph node removal for breast cancer. She suspects a possible link to her diabetes and has previously  discussed this issue with another doctor without receiving a clear explanation.  She has experienced urinary incontinence for the past three days, characterized by an inability to reach the bathroom in time, resulting in wet undergarments. No burning sensation during urination is noted. A recent urinalysis was positive for glucose and protein.  She is currently taking lisinopril  once daily.  She has type two diabetes and is currently using Tresiba , which she has increased to 42 units at bedtime. She was previously on glipizide  but was switched to Mounjaro , which she finds effective in managing her weight, having lost one pound recently. She expresses concern about the cost of Mounjaro , which is $312 per month, and is seeking more affordable options. Her A1c was 8.7 a month ago.  She has a history of hyperlipidemia, with her last cholesterol check in April showing a level of 90. She is also using Freestyle Libre for glucose monitoring and has AccuCheck for finger sticks if needed.     Past Medical History:  Diagnosis Date   Allergy    Arthritis    Breast cancer (HCC)    Breast cancer, left breast (HCC) 10/04/2014   Diabetes mellitus without complication (HCC)    GERD (gastroesophageal reflux disease)    Hyperlipidemia    Hypertension    Osteoporosis  Sleep apnea     Medications: Outpatient Medications Prior to Visit  Medication Sig   Accu-Chek Softclix Lancets lancets Use as instructed   albuterol  (VENTOLIN  HFA) 108 (90 Base) MCG/ACT inhaler INHALE 2 PUFFS BY MOUTH EVERY 6 HOURS AS NEEDED   aspirin  EC 81 MG tablet Take 1 tablet (81 mg total) by mouth daily. Swallow whole.   Blood Glucose Monitoring Suppl (ACCU-CHEK GUIDE) w/Device KIT 1 Device by Does not apply route in the morning.   Continuous Glucose Sensor (FREESTYLE LIBRE 3 PLUS SENSOR) MISC by Does not apply route. Change sensor every 15 days.   cyanocobalamin  (VITAMIN B12) 1000 MCG tablet Take 1,000 mcg by mouth daily.    diclofenac Sodium (VOLTAREN) 1 % GEL Apply 2 g topically as needed.   glucose blood (ACCU-CHEK GUIDE TEST) test strip Use as instructed   glucose blood (ONETOUCH VERIO) test strip To check blood sugar once daily   hydrocortisone  cream 1 % Apply 1 Application topically 2 (two) times daily. External vaginal application   ibuprofen  (ADVIL ) 200 MG tablet Take 400 mg by mouth every 6 (six) hours as needed.   insulin  degludec (TRESIBA  FLEXTOUCH) 100 UNIT/ML FlexTouch Pen Inject 46 Units into the skin at bedtime. Every 3 days start increase units by 2 up to max of 52 units daily until fasting glucose is less than 150   Insulin  Pen Needle (PEN NEEDLES) 32G X 6 MM MISC 1 each by Does not apply route at bedtime.   lisinopril -hydrochlorothiazide  (ZESTORETIC ) 20-12.5 MG tablet Take 1 tablet by mouth daily.   montelukast  (SINGULAIR ) 10 MG tablet Take 1 tablet by mouth once daily   omega-3 acid ethyl esters (LOVAZA) 1 g capsule Take 1 g by mouth daily.   OneTouch Delica Lancets 30G MISC 1 each by Does not apply route as directed.   pantoprazole  (PROTONIX ) 40 MG tablet Take 1 tablet by mouth once daily   rosuvastatin  (CRESTOR ) 10 MG tablet Take 1 tablet (10 mg total) by mouth daily.   tirzepatide  (MOUNJARO ) 2.5 MG/0.5ML Pen Inject 2.5 mg into the skin once a week.   [DISCONTINUED] glipiZIDE  (GLUCOTROL ) 5 MG tablet Take 1 tablet (5 mg total) by mouth daily before breakfast.   [DISCONTINUED] tirzepatide  (MOUNJARO ) 5 MG/0.5ML Pen Inject 5 mg into the skin once a week.   No facility-administered medications prior to visit.    Review of Systems  Last metabolic panel Lab Results  Component Value Date   GLUCOSE 132 (H) 01/05/2024   NA 143 01/05/2024   K 3.6 01/05/2024   CL 103 01/05/2024   CO2 24 01/05/2024   BUN 16 01/05/2024   CREATININE 0.88 01/05/2024   EGFR 73 01/05/2024   CALCIUM  9.7 01/05/2024   PROT 6.5 10/02/2023   ALBUMIN 4.0 10/02/2023   LABGLOB 2.5 10/02/2023   AGRATIO 1.4 05/29/2022    BILITOT 0.3 10/02/2023   ALKPHOS 87 10/02/2023   AST 8 10/02/2023   ALT 14 10/02/2023   ANIONGAP 7 06/15/2020   Last lipids Lab Results  Component Value Date   CHOL 145 10/02/2023   HDL 29 (L) 10/02/2023   LDLCALC 90 10/02/2023   TRIG 148 10/02/2023   CHOLHDL 5.0 (H) 10/02/2023   Last hemoglobin A1c Lab Results  Component Value Date   HGBA1C 8.7 (A) 04/20/2024   Last thyroid  functions Lab Results  Component Value Date   TSH 1.630 03/09/2023     {See past labs  Heme  Chem  Endocrine  Serology  Results Review (optional):1}  Objective    BP 138/80 (BP Location: Right Arm, Patient Position: Sitting) Comment: manual  Pulse 82   Temp 98.2 F (36.8 C) (Oral)   Ht 4' 11.5 (1.511 m)   Wt 225 lb 12.8 oz (102.4 kg)   SpO2 100%   BMI 44.84 kg/m  BP Readings from Last 3 Encounters:  05/12/24 138/80  04/20/24 (!) 143/79  02/29/24 (!) 157/59   Wt Readings from Last 3 Encounters:  05/12/24 225 lb 12.8 oz (102.4 kg)  04/20/24 228 lb 4.8 oz (103.6 kg)  02/29/24 219 lb 12.8 oz (99.7 kg)    {See vitals history (optional):1}    Physical Exam  Physical Exam VITALS: BP- 160/70 CHEST: Clear to auscultation bilaterally. No wheezes, rhonchi, or crackles. SKIN: Redness and boil observed on skin.    Results for orders placed or performed in visit on 05/12/24  POCT Urinalysis Dipstick  Result Value Ref Range   Color, UA Yellow    Clarity, UA Clear    Glucose, UA Positive (A) Negative   Bilirubin, UA Negative    Ketones, UA Negative    Spec Grav, UA 1.020 1.010 - 1.025   Blood, UA Hem++    pH, UA 5.0 5.0 - 8.0   Protein, UA Positive (A) Negative   Urobilinogen, UA 0.2 0.2 or 1.0 E.U./dL   Nitrite, UA Positive    Leukocytes, UA Negative Negative   Appearance     Odor      Assessment & Plan     Problem List Items Addressed This Visit     Diabetic neuropathy (HCC)   Hyperlipidemia associated with type 2 diabetes mellitus (HCC)   Hypertension associated with  diabetes (HCC) - Primary   Uncontrolled type 2 diabetes mellitus with hyperglycemia, with long-term current use of insulin  (HCC)   Other Visit Diagnoses       Screening mammogram for breast cancer       Relevant Orders   MM 3D SCREENING MAMMOGRAM BILATERAL BREAST     Screening for osteoporosis       Relevant Orders   DG Bone Density     Urinary incontinence, unspecified type       Relevant Medications   solifenacin  (VESICARE ) 5 MG tablet   Other Relevant Orders   POCT Urinalysis Dipstick (Completed)   Urine Culture   Urine Microscopic     Urge incontinence of urine       Relevant Medications   solifenacin  (VESICARE ) 5 MG tablet     Hematuria, unspecified type       Relevant Orders   Urine Microscopic     Motion sickness, subsequent encounter       Relevant Medications   scopolamine  (TRANSDERM-SCOP) 1 MG/3DAYS     Hidradenitis suppurativa of multiple sites       Relevant Medications   doxycycline  (VIBRA -TABS) 100 MG tablet       Assessment and Plan Assessment & Plan Type 2 diabetes mellitus with hyperglycemia and diabetic autonomic neuropathy Chronic  Not yet at goal of less than 8  Type 2 diabetes with hyperglycemia, recent A1c of 8.7%. Current regimen includes Tresiba  and Mounjaro . Mounjaro  is preferred due to better satiety and weight management. Insurance issues with Mounjaro  cost. Blood glucose target is fasting glucose <150 mg/dL. Weight decreased by 3 pounds since last month. - Continue Tresiba  at 42 units daily, increase by 2 units every night until 46 units, then increase by 2 units every 3 days until fasting glucose is <150 mg/dL,  max 52 units. - Continue Mounjaro  2.5 mg weekly, provided samples for 2 more weeks. - Monitor blood glucose with Freestyle Libre and AccuCheck as needed. - Discussed insurance options for Mounjaro  with pharmacist. - Follow up with endocrinology   Hypertension associated with type 2 diabetes mellitus Hypertension with current blood  pressure of 160/70 mmHg. Managed with lisinopril  and hydrochlorothiazide . - Continue lisinopril  20 and hydrochlorothiazide  25mg  daily  - Rechecked blood pressure.  Hyperlipidemia associated with type 2 diabetes mellitus Chronic  Hyperlipidemia with last cholesterol check in April showing 90 mg/dL. - Continue current management.  Hidradenitis suppurativa Recurrent boils in groin and buttock area. Previous treatment with topical creams and clindamycin. Consideration for dermatology referral for advanced treatment options. - Prescribed doxycycline  100 mg twice daily for 7 days for hidradenitis flare. - Referred to dermatology for further management options.  Urge urinary incontinence Urinary incontinence for 3 days with frequency but no burning. Differential includes overactive bladder versus urinary tract infection. - Prescribed doxycycline  100 mg twice daily for 7 days for urinary symptoms.  Motion sickness Upcoming cruise. Scopolamine  patches prescribed for management. - Prescribed scopolamine  patches 1 mg to be applied every 3 days.  General Health Maintenance Routine health maintenance discussed. - Ordered mammogram for breast cancer screening. - Ordered bone density scan.     Return in about 2 months (around 07/13/2024) for DM.         Rockie Agent, MD  [facility] [phone] (phone) [phone] (fax)  [facility]

## 2024-05-12 NOTE — Progress Notes (Signed)
 S:     Reason for visit: ?  Alexis Lloyd is a 63 y.o. female with a history of diabetes (type 2), who presents today for an initial diabetes Face to Face pharmacotherapy visit.? Pertinent PMH also includes HTN, GERD, HLD, hx of breast cancer.  Care Team: Primary Care Provider: Sharma Coyer, MD  At last visit on 04/20/24, A1c was elevated at 8.7%. Patient was given sample of Mounjaro  2.5 mg weekly.  In the interim, patient contacted pharmacist to report that Mounjaro  therapy is ~$300 on insurance.   Patient saw PCP today and was given 2 months of samples for Mounjaro  2.5 mg weekly. She was also instructed to increase Tresiba  to 46 units daily and then increase by 2 units every 3 days until fasting BG <150 mg/dL max of 52 units daily.    Current diabetes medications include: Mounjaro  2.5 mg weekly, Tresiba  42 units daily Previous diabetes medications include: glipizide , Farxiga , Jardiance , Januvia , Trulicity , Ozempic , Rybelsus   Current hypertension medications include: lisinopril /hydrochlorothiazide  20/12.5 mg daily  Current hyperlipidemia medications include: rosuvastatin  10 mg dialy, Lovaza 1g daily   Patient reports adherence to taking all medications as prescribed.   Have you been experiencing any side effects to the medications prescribed? no Do you have any problems obtaining medications due to transportation or finances? yes Insurance coverage: [employer]  Patient denies hypoglycemic events.  Reported home fasting blood sugars: 200-300 mg/dL  *patient uses a [employer] 3+ reader, but did not bring this to clinic today.   DM Prevention:  Statin: Taking; moderate intensity.?  ACE/ARB: yes; lisinopril /hydrochlorothiazide   Last urinary albumin/creatinine ratio:  Lab Results  Component Value Date   MICRALBCREAT 58 (H) 04/20/2024   MICRALBCREAT 11 03/09/2023   MICRALBCREAT <7 11/29/2021   MICRALBCREAT 62 (H) 08/30/2021   Last eye exam:  Lab  Results  Component Value Date   HMDIABEYEEXA No Retinopathy 01/06/2024   Lab Results  Component Value Date   HMDIABEYEEXA No Retinopathy 01/06/2024   Last foot exam: 07/16/2023 Tobacco Use:  Tobacco Use: Medium Risk (04/20/2024)   Patient History    Smoking Tobacco Use: Former    Smokeless Tobacco Use: Never    Passive Exposure: Not on file   O:    Vitals:  Wt Readings from Last 3 Encounters:  04/20/24 228 lb 4.8 oz (103.6 kg)  02/29/24 219 lb 12.8 oz (99.7 kg)  02/11/24 217 lb 1.6 oz (98.5 kg)   BP Readings from Last 3 Encounters:  04/20/24 (!) 143/79  02/29/24 (!) 157/59  02/11/24 (!) 142/82   Pulse Readings from Last 3 Encounters:  04/20/24 82  02/29/24 63  02/11/24 71     Labs:?  Lab Results  Component Value Date   HGBA1C 8.7 (A) 04/20/2024   HGBA1C 9.2 (H) 02/11/2024   HGBA1C 8.5 (H) 10/14/2023   GLUCOSE 132 (H) 01/05/2024   MICRALBCREAT 58 (H) 04/20/2024   MICRALBCREAT 11 03/09/2023   MICRALBCREAT <7 11/29/2021   CREATININE 0.88 01/05/2024   CREATININE 0.62 10/02/2023   CREATININE 1.05 (H) 03/09/2023    Lab Results  Component Value Date   CHOL 145 10/02/2023   LDLCALC 90 10/02/2023   LDLCALC 80 03/09/2023   LDLCALC 98 05/29/2022   HDL 29 (L) 10/02/2023   TRIG 148 10/02/2023   TRIG 269 (H) 03/09/2023   TRIG 142 05/29/2022   ALT 14 10/02/2023   ALT 18 03/09/2023   AST 8 10/02/2023   AST 11 03/09/2023      Chemistry  Component Value Date/Time   NA 143 01/05/2024 1124   NA 141 04/14/2014 0925   K 3.6 01/05/2024 1124   K 4.0 04/14/2014 0925   CL 103 01/05/2024 1124   CL 103 04/14/2014 0925   CO2 24 01/05/2024 1124   CO2 29 04/14/2014 0925   BUN 16 01/05/2024 1124   BUN 17 04/14/2014 0925   CREATININE 0.88 01/05/2024 1124   CREATININE 0.75 04/14/2014 0925   GLU 147 08/03/2014 0000      Component Value Date/Time   CALCIUM  9.7 01/05/2024 1124   CALCIUM  9.1 04/14/2014 0925   ALKPHOS 87 10/02/2023 1055   ALKPHOS 68 11/02/2013  1007   AST 8 10/02/2023 1055   AST 15 11/02/2013 1007   ALT 14 10/02/2023 1055   ALT 50 11/02/2013 1007   BILITOT 0.3 10/02/2023 1055   BILITOT 0.4 11/02/2013 1007       The 10-year ASCVD risk score (Arnett DK, et al., 2019) is: 22.9%  Lab Results  Component Value Date   MICRALBCREAT 58 (H) 04/20/2024   MICRALBCREAT 11 03/09/2023   MICRALBCREAT <7 11/29/2021   MICRALBCREAT 62 (H) 08/30/2021    A/P: Diabetes currently uncontrolled with a most recent A1c of 8.7% on 04/20/24, which is down from 9.2% on 02/11/24. Patient is able to verbalize appropriate hypoglycemia management plan. Patient was historically on Ozempic  therapy, but continued to gain weight and found she would not be able to continued to obtain the medication from the PAP program in 2026. She currently has 2 months of Mounjaro  samples. Medicare open enrollment closes on 05/15/24. Patient would like to start using the phone application for [facility] in order of the clinic to better view her BG readings, however, she does not remember her login information for her app store.  -Increased dose of basal insulin  Tresiba  (insulin  degludec)  to 46 units daily and then increase by 2 units every 3 days for FBG >150 mg/dL per PCP instructions.  -Continued GLP-1 Mounjaro  (tirzepatide )  2.5 mg weekly -Extensively discussed pathophysiology of diabetes, recommended lifestyle interventions, dietary effects on blood sugar control.  -Counseled on s/sx of and management of hypoglycemia.  -Next A1c anticipated 07/2024.  -Encouraged patient to meet with insurance representative before the end of the week to discuss insurance plan for the new year -Encouraged patient to have son assist with downloading and setting up [employer] application. Showed patient which application to use. Will set up with clinic and application at follow up  ASCVD risk - primary prevention in patient with diabetes. Last LDL is 90 mg/dL, not at goal of <29 mg/dL.  10-year ASCVD risk score of 22.9%. Could benefit from high intensity statin therapy. Will address at follow up.  -Continued rosuvastatin  10 mg daily.  -Continued Lovaza 1 g daily   Patient verbalized understanding of treatment plan. Total time patient counseling 30 minutes.  Follow-up:  Pharmacist on 06/22/24 PCP clinic visit on 07/28/24  Peyton CHARLENA Ferries, PharmD, CPP Clinical Pharmacist [facility] [phone]

## 2024-05-13 ENCOUNTER — Ambulatory Visit: Payer: Self-pay | Admitting: Family Medicine

## 2024-05-13 ENCOUNTER — Encounter: Payer: Self-pay | Admitting: Family Medicine

## 2024-05-13 NOTE — Assessment & Plan Note (Signed)
 Chronic  Hyperlipidemia with last cholesterol check in April showing 90 mg/dL. - Continue current management with Crestor  10mg  daily

## 2024-05-13 NOTE — Assessment & Plan Note (Signed)
 Chronic problem  Previously discontinued jardiance  due to vaginal candidiasis symptoms  Continues to report symptoms of urge incontinence  Started trial of vesicare  5mg  nightly

## 2024-05-13 NOTE — Assessment & Plan Note (Signed)
 Chronic  Continue Protonix  40mg  daily  GI appt scheduled for 05/17/24

## 2024-05-13 NOTE — Assessment & Plan Note (Signed)
 Chronic  Not yet at goal of less than 8  Type 2 diabetes with hyperglycemia, recent A1c of 8.7%. Current regimen includes Tresiba  and Mounjaro . Mounjaro  is preferred due to better satiety and weight management. Insurance issues with Mounjaro  cost. Blood glucose target is fasting glucose <150 mg/dL. Weight decreased by 3 pounds since last month. - Continue Tresiba  at 42 units daily, increase by 2 units every night until 46 units, then increase by 2 units every 3 days until fasting glucose is <150 mg/dL, max 52 units. - Continue Mounjaro  2.5 mg weekly, provided samples for 2 more weeks. - Monitor blood glucose with Freestyle Libre and AccuCheck as needed. - Discussed insurance options for Mounjaro  with pharmacist. - Follow up with endocrinology

## 2024-05-13 NOTE — Assessment & Plan Note (Signed)
 Chronic condition  Hypertension with current blood pressure of 160/70 mmHg. Managed with lisinopril  and hydrochlorothiazide . Improved on repeat measurement - Continue lisinopril  20 and hydrochlorothiazide  25mg  daily

## 2024-05-13 NOTE — Assessment & Plan Note (Signed)
 Chronic  BMI in Class III obesity range, 44.84  Weight is increased 3 pounds since her last visit and 6 pounds since Sept 2025  Recommend moderate intensity exercise 4 days per week, goal 150 mins per week  Continue tirzepatide  2.5mg  weekly for glucose management, goal is to increase to 5mg  weekly however prescription is cost prohibitive with patient's $300 co-pay Samples for 2.5mg  given today

## 2024-05-13 NOTE — Assessment & Plan Note (Signed)
 Chronic and reoccurring problem  Recurrent boils in groin and buttock area. Previous treatment with topical creams and clindamycin. Consideration for dermatology referral for advanced treatment options. - Prescribed doxycycline  100 mg twice daily for 7 days for hidradenitis flare. - Referred to dermatology for further management options.

## 2024-05-13 NOTE — Assessment & Plan Note (Signed)
 Under surveillance  F/u with oncology as scheduled  Mammogram ordered

## 2024-05-13 NOTE — Assessment & Plan Note (Signed)
 Chronic condition, stable  Continue DM medication regimen as prescribed

## 2024-05-13 NOTE — Assessment & Plan Note (Signed)
 Chronic, intermittent  Upcoming cruise. Scopolamine  patches prescribed for management. - Prescribed scopolamine  patches 1 mg to be applied every 3 days.

## 2024-05-16 LAB — URINALYSIS, MICROSCOPIC ONLY
Casts: NONE SEEN /LPF
Epithelial Cells (non renal): >10 /HPF — AB (ref 0–10)
RBC, Urine: NONE SEEN /HPF (ref 0–2)

## 2024-05-16 LAB — URINE CULTURE

## 2024-05-17 ENCOUNTER — Ambulatory Visit: Payer: Self-pay

## 2024-05-17 ENCOUNTER — Encounter: Payer: Self-pay | Admitting: Family Medicine

## 2024-05-17 ENCOUNTER — Ambulatory Visit (INDEPENDENT_AMBULATORY_CARE_PROVIDER_SITE_OTHER): Admitting: Family Medicine

## 2024-05-17 VITALS — BP 160/55 | HR 87 | Temp 98.6°F | Ht 59.5 in | Wt 226.8 lb

## 2024-05-17 DIAGNOSIS — R0981 Nasal congestion: Secondary | ICD-10-CM

## 2024-05-17 DIAGNOSIS — T753XXD Motion sickness, subsequent encounter: Secondary | ICD-10-CM | POA: Diagnosis not present

## 2024-05-17 DIAGNOSIS — I152 Hypertension secondary to endocrine disorders: Secondary | ICD-10-CM | POA: Diagnosis not present

## 2024-05-17 DIAGNOSIS — R051 Acute cough: Secondary | ICD-10-CM

## 2024-05-17 DIAGNOSIS — E1159 Type 2 diabetes mellitus with other circulatory complications: Secondary | ICD-10-CM

## 2024-05-17 DIAGNOSIS — H6691 Otitis media, unspecified, right ear: Secondary | ICD-10-CM

## 2024-05-17 DIAGNOSIS — J3089 Other allergic rhinitis: Secondary | ICD-10-CM | POA: Diagnosis not present

## 2024-05-17 DIAGNOSIS — Z794 Long term (current) use of insulin: Secondary | ICD-10-CM

## 2024-05-17 MED ORDER — METHYLPREDNISOLONE ACETATE 40 MG/ML IJ SUSP
40.0000 mg | Freq: Once | INTRAMUSCULAR | Status: AC
  Administered 2024-05-17: 40 mg via INTRAMUSCULAR

## 2024-05-17 MED ORDER — AZITHROMYCIN 250 MG PO TABS: ORAL_TABLET | ORAL | 0 refills | Status: AC

## 2024-05-17 MED ORDER — SCOPOLAMINE 1 MG/3DAYS TD PT72: 1.0000 | MEDICATED_PATCH | TRANSDERMAL | 0 refills | Status: AC

## 2024-05-17 NOTE — Telephone Encounter (Signed)
 FYI Only or Action Required?: FYI only for provider: appointment scheduled on 05/17/2024.  Patient was last seen in primary care on 05/12/2024 by Sharma Coyer, MD.  Called Nurse Triage reporting Shortness of Breath.  Symptoms began 05/11/2024.  Interventions attempted: OTC medications: tylenol .  Symptoms are: gradually worsening.  Triage Disposition: See PCP When Office is Open (Within 3 Days)  Patient/caregiver understands and will follow disposition?: Yes   Copied from CRM [id number]. Topic: Clinical - Red Word Triage >> May 17, 2024  7:35 AM Donna BRAVO wrote: Red Word that prompted transfer to Nurse Triage:  -coughing mucus thick yellow green  -fever  -itchy ears and throat -shortness of breath Reason for Disposition  [1] MODERATE longstanding difficulty breathing (e.g., speaks in phrases, SOB even at rest, pulse 100-120) AND [2] SAME as normal  Answer Assessment - Initial Assessment Questions 1. RESPIRATORY STATUS: Describe your breathing? (e.g., wheezing, shortness of breath, unable to speak, severe coughing)      SOB at times - pt using inhaler to help, coughing, stuffy nose 2. ONSET: When did this breathing problem begin?      X 3 days 3. PATTERN Does the difficult breathing come and go, or has it been constant since it started?      Comes and goes 4. SEVERITY: How bad is your breathing? (e.g., mild, moderate, severe)      moderate 5. RECURRENT SYMPTOM: Have you had difficulty breathing before? If Yes, ask: When was the last time? and What happened that time?      Yes, asthma 6. CARDIAC HISTORY: Do you have any history of heart disease? (e.g., heart attack, angina, bypass surgery, angioplasty)      na 7. LUNG HISTORY: Do you have any history of lung disease?  (e.g., pulmonary embolus, asthma, emphysema)     asthma 8. CAUSE: What do you think is causing the breathing problem?      Cold s./s 9. OTHER SYMPTOMS: Do you have any other symptoms?  (e.g., chest pain, cough, dizziness, fever, runny nose)     Headache-over right eye, cough, yellowish-green & thick, fever 100, stuffy nose, body aches 10. O2 SATURATION MONITOR:  Do you use an oxygen saturation monitor (pulse oximeter) at home? If Yes, ask: What is your reading (oxygen level) today? What is your usual oxygen saturation reading? (e.g., 95%)       N/a 11. PREGNANCY: Is there any chance you are pregnant? When was your last menstrual period?       N/a 12. TRAVEL: Have you traveled out of the country in the last month? (e.g., travel history, exposures)       N/a  Protocols used: Breathing Difficulty-A-AH

## 2024-05-17 NOTE — Telephone Encounter (Signed)
 Patient call note and symptoms reviewed. Agree with scheduled appt. Will evaluate during OV

## 2024-05-17 NOTE — Progress Notes (Unsigned)
 ACUTE VISIT   Patient: Alexis Lloyd   DOB: April 16, 1961   63 y.o. Female  MRN: [id number]   PCP: Sharma Coyer, MD  Chief Complaint  Patient presents with   URI    Patient is present with uri syx of sinus congestion, headache, cough, fever, itchy ears and throat    Subjective    HPI HPI     URI    Additional comments: Patient is present with uri syx of sinus congestion, headache, cough, fever, itchy ears and throat       Last edited by Cherry Chiquita HERO, CMA on 05/17/2024  3:59 PM.       Discussed the use of AI scribe software for clinical note transcription with the patient, who gave verbal consent to proceed.  History of Present Illness Arielis D Rhymes is a 63 year old female with type two diabetes, obesity, and hypertension who presents with upper respiratory symptoms.  She has been experiencing upper respiratory symptoms for the past three days, including sinus congestion, headache, cough, fever, itchy ears, and throat. She describes a sensation of 'a clog of snot' in her head and sneezing, with expectoration of thick, greenish-yellow mucus. Her symptoms have not improved over the past week.  She takes Singulair  at night and Zyrtec  during the day for her symptoms. She has a history of allergies, which she believes may be contributing to her current symptoms. Her ears have been itching severely, particularly last night.  Her past medical history includes type two diabetes, obesity, and hypertension. She is currently on lisinopril  for hypertension, tirzepatide  for diabetes, and Tresiba  for glucose management. She is also taking doxycycline  and VESIcare .  She is concerned about her upcoming cruise and the need for scopolamine  patches for motion sickness, which she has been unable to obtain due to a requirement for prior authorization.     Medications: Outpatient Medications Prior to Visit  Medication Sig   Accu-Chek Softclix Lancets lancets Use as  instructed   albuterol  (VENTOLIN  HFA) 108 (90 Base) MCG/ACT inhaler INHALE 2 PUFFS BY MOUTH EVERY 6 HOURS AS NEEDED   aspirin  EC 81 MG tablet Take 1 tablet (81 mg total) by mouth daily. Swallow whole.   Blood Glucose Monitoring Suppl (ACCU-CHEK GUIDE) w/Device KIT 1 Device by Does not apply route in the morning.   Continuous Glucose Sensor (FREESTYLE LIBRE 3 PLUS SENSOR) MISC by Does not apply route. Change sensor every 15 days.   cyanocobalamin  (VITAMIN B12) 1000 MCG tablet Take 1,000 mcg by mouth daily.   diclofenac Sodium (VOLTAREN) 1 % GEL Apply 2 g topically as needed.   doxycycline  (VIBRA -TABS) 100 MG tablet Take 1 tablet (100 mg total) by mouth 2 (two) times daily for 7 days.   glucose blood (ACCU-CHEK GUIDE TEST) test strip Use as instructed   glucose blood (ONETOUCH VERIO) test strip To check blood sugar once daily   hydrocortisone  cream 1 % Apply 1 Application topically 2 (two) times daily. External vaginal application   ibuprofen  (ADVIL ) 200 MG tablet Take 400 mg by mouth every 6 (six) hours as needed.   insulin  degludec (TRESIBA  FLEXTOUCH) 100 UNIT/ML FlexTouch Pen Inject 46 Units into the skin at bedtime. Every 3 days start increase units by 2 up to max of 52 units daily until fasting glucose is less than 150   Insulin  Pen Needle (PEN NEEDLES) 32G X 6 MM MISC 1 each by Does not apply route at bedtime.   lisinopril -hydrochlorothiazide  (ZESTORETIC ) 20-12.5  MG tablet Take 1 tablet by mouth daily.   montelukast  (SINGULAIR ) 10 MG tablet Take 1 tablet by mouth once daily   omega-3 acid ethyl esters (LOVAZA) 1 g capsule Take 1 g by mouth daily.   OneTouch Delica Lancets 30G MISC 1 each by Does not apply route as directed.   pantoprazole  (PROTONIX ) 40 MG tablet Take 1 tablet by mouth once daily   rosuvastatin  (CRESTOR ) 10 MG tablet Take 1 tablet (10 mg total) by mouth daily.   solifenacin  (VESICARE ) 5 MG tablet Take 1 tablet (5 mg total) by mouth daily.   tirzepatide  (MOUNJARO ) 2.5 MG/0.5ML  Pen Inject 2.5 mg into the skin once a week.   [DISCONTINUED] scopolamine  (TRANSDERM-SCOP) 1 MG/3DAYS Place 1 patch (1 mg total) onto the skin every 3 (three) days.   No facility-administered medications prior to visit.    {Insert previous labs (optional):23779} {See past labs  Heme  Chem  Endocrine  Serology  Results Review (optional):1}   Objective    BP (!) 160/55 (BP Location: Right Arm, Patient Position: Sitting, Cuff Size: Normal)   Pulse 87   Temp 98.6 F (37 C) (Oral)   Ht 4' 11.5 (1.511 m)   Wt 226 lb 12.8 oz (102.9 kg)   SpO2 98%   BMI 45.04 kg/m  {Insert last BP/Wt (optional):23777}{See vitals history (optional):1}  Physical Exam   Physical Exam VITALS: BP- 160/55 GENERAL: Ill appearing. HEENT: Right ear infection present. Nasal turbinates enlarged. Throat normal. CHEST: Lungs clear to auscultation, no wheezing.   No results found for any visits on 05/17/24.  Assessment & Plan     Assessment & Plan Acute upper respiratory infection with allergic rhinitis and right acute otitis media Symptoms include sinus congestion, headache, cough, fever, itchy ears and throat, and thick greenish-yellow mucus. Examination reveals enlarged turbinates and redness in the right ear lobe, suggesting right acute otitis media. Negative COVID and influenza tests. Likely allergic rhinitis flare. Hesitant to prescribe prednisone  due to potential hyperglycemia. - Prescribed azithromycin  500 mg daily for 5 days. - Administered dexamethasone  40 mg today. - Continue doxycycline  100 mg twice daily. - Continue Singulair  at night and Zyrtec  during the day.  Type 2 diabetes mellitus with hypertension Blood pressure elevated at 160/55. Concerns about hyperglycemia with dexamethasone  use. - Continue lisinopril  20 mg daily. - Continue tirzepatide  2.5 mg weekly. - Continue Tresiba , increase to at least 48 units. - Use continuous glucose meter for glucose checks.  Hidradenitis suppurativa  with cellulitis Currently on doxycycline  for hidradenitis suppurativa and cellulitis. - Continue doxycycline  100 mg twice daily.  Overactive bladder Previously prescribed VESIcare  5 mg for overactive bladder symptoms. - Continue VESIcare  5 mg as needed.  Motion sickness Requires scopolamine  patches for motion sickness during upcoming cruise. Pharmacy requires prior authorization. - Resubmitted prescription for scopolamine  patches with zero refills for ten patches.      Return in about 1 week (around 05/24/2024), or if symptoms worsen or fail to improve.        Rockie Agent, MD  [facility] [phone] (phone) [phone] (fax)  [facility]

## 2024-05-17 NOTE — Patient Instructions (Signed)
 To keep you healthy, please keep in mind the following health maintenance items that you are due for:   Health Maintenance Due  Topic Date Due   Pneumococcal Vaccine: 50+ Years (2 of 2 - PCV) 08/05/2016   Zoster Vaccines- Shingrix  (2 of 2) 10/10/2021   Mammogram  12/17/2022   Bone Density Scan  Never done   Cervical Cancer Screening (HPV/Pap Cotest)  05/17/2024     Best Wishes,   Dr. Lang

## 2024-05-18 ENCOUNTER — Encounter: Payer: Self-pay | Admitting: Family Medicine

## 2024-05-18 ENCOUNTER — Telehealth: Payer: Self-pay | Admitting: Pharmacy Technician

## 2024-05-18 ENCOUNTER — Other Ambulatory Visit (HOSPITAL_COMMUNITY): Payer: Self-pay

## 2024-05-18 ENCOUNTER — Telehealth: Payer: Self-pay

## 2024-05-18 NOTE — Telephone Encounter (Signed)
 Gave pt a call pt is due for re-enrollment on Novo Nordisk (Tresiba /pen needles) left a HIPAA VM

## 2024-05-18 NOTE — Telephone Encounter (Signed)
 Pharmacy Patient Advocate Encounter   Received notification from Onbase that prior authorization for Scopolamine  1MG /3DAYS 72 hr patches is required/requested.   Insurance verification completed.   The patient is insured through [employer].   Per test claim: PA required; PA submitted to above mentioned insurance via Latent Key/confirmation #/EOC [id number] Status is pending

## 2024-05-19 ENCOUNTER — Other Ambulatory Visit (HOSPITAL_COMMUNITY): Payer: Self-pay

## 2024-05-19 NOTE — Telephone Encounter (Signed)
 Pharmacy Patient Advocate Encounter  Received notification from [employer] that Prior Authorization for Scopolamine  1MG /3DAYS 72 hr patches has been APPROVED from 06/10/2023 to 06/08/2025. Ran test claim, Copay is $71.18. This test claim was processed through [facility]- copay amounts may vary at other pharmacies due to pharmacy/plan contracts, or as the patient moves through the different stages of their insurance plan.   PA #/Case ID/Reference #: [id number]

## 2024-05-20 LAB — POC COVID19 BINAXNOW: SARS Coronavirus 2 Ag: NEGATIVE

## 2024-06-07 ENCOUNTER — Encounter: Admitting: Dietician

## 2024-06-13 ENCOUNTER — Telehealth: Payer: Self-pay | Admitting: Family Medicine

## 2024-06-13 NOTE — Telephone Encounter (Signed)
 Patient and insurance agent dropped off form from [facility] needing a chronic condition checked off and a signature.  Please sign asap and fax to # on form.    The patient's insurance coverage depends on this form for 2026.

## 2024-06-14 DIAGNOSIS — I10 Essential (primary) hypertension: Secondary | ICD-10-CM

## 2024-06-14 DIAGNOSIS — R051 Acute cough: Secondary | ICD-10-CM

## 2024-06-14 DIAGNOSIS — J4520 Mild intermittent asthma, uncomplicated: Secondary | ICD-10-CM

## 2024-06-14 DIAGNOSIS — R0602 Shortness of breath: Secondary | ICD-10-CM

## 2024-06-15 NOTE — Telephone Encounter (Signed)
 Form was complete and placed in pile for fax on 06/13/24

## 2024-06-16 ENCOUNTER — Other Ambulatory Visit: Payer: Self-pay | Admitting: Family Medicine

## 2024-06-16 NOTE — Telephone Encounter (Unsigned)
 Copied from CRM [id number]. Topic: Clinical - Medication Refill >> Jun 16, 2024 10:01 AM Selinda RAMAN wrote: Medication: Continuous Glucose Sensor (FREESTYLE LIBRE 3 PLUS SENSOR) MISC  Has the patient contacted their pharmacy? Yes   This is the patient's preferred pharmacy:  [facility] [address]), [place] - [address] [place]) [place] [address] Phone: [phone] Fax: [phone]  Is this the correct pharmacy for this prescription? Yes If no, delete pharmacy and type the correct one.   Has the prescription been filled recently? No  Is the patient out of the medication? No but she has 1 left  Has the patient been seen for an appointment in the last year OR does the patient have an upcoming appointment? Yes  Can we respond through MyChart? No  Please assist patient further

## 2024-06-16 NOTE — Telephone Encounter (Signed)
 Requested medications are due for refill today.  unsure  Requested medications are on the active medications list.  yes  Last refill. Med is historical  Future visit scheduled.   yes  Notes to clinic.  Please review for refill.     Requested Prescriptions  Pending Prescriptions Disp Refills   Continuous Glucose Sensor (FREESTYLE LIBRE 3 PLUS SENSOR) MISC      Sig: by Does not apply route. Change sensor every 15 days.     There is no refill protocol information for this order

## 2024-06-19 MED ORDER — FREESTYLE LIBRE 3 PLUS SENSOR MISC: 1.0000 | 6 refills | Status: AC

## 2024-06-22 ENCOUNTER — Other Ambulatory Visit (HOSPITAL_COMMUNITY): Payer: Self-pay

## 2024-06-22 ENCOUNTER — Ambulatory Visit (INDEPENDENT_AMBULATORY_CARE_PROVIDER_SITE_OTHER)

## 2024-06-22 ENCOUNTER — Telehealth: Payer: Self-pay

## 2024-06-22 DIAGNOSIS — E1142 Type 2 diabetes mellitus with diabetic polyneuropathy: Secondary | ICD-10-CM

## 2024-06-22 DIAGNOSIS — E1165 Type 2 diabetes mellitus with hyperglycemia: Secondary | ICD-10-CM | POA: Diagnosis not present

## 2024-06-22 DIAGNOSIS — Z794 Long term (current) use of insulin: Secondary | ICD-10-CM

## 2024-06-22 MED ORDER — INSULIN LISPRO (1 UNIT DIAL) 100 UNIT/ML (KWIKPEN): 4.0000 [IU] | PEN_INJECTOR | Freq: Three times a day (TID) | SUBCUTANEOUS | 2 refills | Status: DC

## 2024-06-22 MED ORDER — ACCU-CHEK GUIDE TEST VI STRP: ORAL_STRIP | 12 refills | Status: AC

## 2024-06-22 MED ORDER — TRULICITY 1.5 MG/0.5ML ~~LOC~~ SOAJ: 1.5000 mg | SUBCUTANEOUS | 5 refills | Status: AC

## 2024-06-22 MED ORDER — PEN NEEDLES 32G X 6 MM MISC: 3 refills | Status: AC

## 2024-06-22 MED ORDER — ACCU-CHEK SOFTCLIX LANCETS MISC: 12 refills | Status: AC

## 2024-06-22 MED ORDER — ACCU-CHEK GUIDE W/DEVICE KIT: 1.0000 | PACK | Freq: Every morning | 1 refills | Status: AC

## 2024-06-22 NOTE — Telephone Encounter (Signed)
 Pharmacy Patient Advocate Encounter   Received notification from Physician's Office that prior authorization for MOUNJARO  is required/requested.   Insurance verification completed.   The patient is insured through [employer].   Per test claim: The current 28 day co-pay is, $690.86.  No PA needed at this time. This test claim was processed through [facility]- copay amounts may vary at other pharmacies due to pharmacy/plan contracts, or as the patient moves through the different stages of their insurance plan.

## 2024-06-22 NOTE — Progress Notes (Signed)
 "  S:     Reason for visit: ?  Alexis Lloyd is a 64 y.o. female with a history of diabetes (type 2), who presents today for an initial diabetes Face to Face pharmacotherapy visit.? Pertinent PMH also includes HTN, GERD, HLD, hx of breast cancer.  Care Lloyd: Primary Care Provider: Sharma Coyer, MD  At last visit with PCP on   05/12/24, patient was instructed to increase Tresiba  by 2 units every 3 days until fasting BG <150 mg/dL max of 52 units daily.    Today, she reports she has increased her Tresiba  to 50 units daily. She did not bring her Freestyle Libre reader to clinic, but reports her BG has been consistently elevated in the 200-300s.  Current diabetes medications include: Mounjaro  2.5 mg weekly, Tresiba  50 units daily Previous diabetes medications include: glipizide , Farxiga , Jardiance , Januvia , Alexis Lloyd , Ozempic , Rybelsus   Current hypertension medications include: lisinopril /hydrochlorothiazide  20/12.5 mg daily  Current hyperlipidemia medications include: rosuvastatin  10 mg dialy, Lovaza 1g daily   Patient reports adherence to taking all medications as prescribed.   Have you been experiencing any side effects to the medications prescribed? no Do you have any problems obtaining medications due to transportation or finances? yes Insurance coverage: Alexis Lloyd  Patient denies hypoglycemic events.  Reported home fasting blood sugars: 200-300 mg/dL  *patient uses a Alexis Lloyd 3+ reader, but did not bring this to clinic today.   DM Prevention:  Statin: Taking; moderate intensity.?  ACE/ARB: yes; lisinopril /hydrochlorothiazide   Last urinary albumin/creatinine ratio:  Lab Results  Component Value Date   MICRALBCREAT 58 (H) 04/20/2024   MICRALBCREAT 11 03/09/2023   MICRALBCREAT <7 11/29/2021   MICRALBCREAT 62 (H) 08/30/2021   Last eye exam:  Lab Results  Component Value Date   HMDIABEYEEXA No Retinopathy 01/06/2024   Lab Results   Component Value Date   HMDIABEYEEXA No Retinopathy 01/06/2024   Last foot exam: 07/16/2023 Tobacco Use:  Tobacco Use: Medium Risk (05/18/2024)   Patient History    Smoking Tobacco Use: Former    Smokeless Tobacco Use: Never    Passive Exposure: Not on file   O:    Vitals:  Wt Readings from Last 3 Encounters:  05/17/24 226 lb 12.8 oz (102.9 kg)  05/12/24 225 lb 12.8 oz (102.4 kg)  04/20/24 228 lb 4.8 oz (103.6 kg)   BP Readings from Last 3 Encounters:  05/17/24 (!) 160/55  05/12/24 138/80  04/20/24 (!) 143/79   Pulse Readings from Last 3 Encounters:  05/17/24 87  05/12/24 82  04/20/24 82     Labs:?  Lab Results  Component Value Date   HGBA1C 8.7 (A) 04/20/2024   HGBA1C 9.2 (H) 02/11/2024   HGBA1C 8.5 (H) 10/14/2023   GLUCOSE 132 (H) 01/05/2024   MICRALBCREAT 58 (H) 04/20/2024   MICRALBCREAT 11 03/09/2023   MICRALBCREAT <7 11/29/2021   CREATININE 0.88 01/05/2024   CREATININE 0.62 10/02/2023   CREATININE 1.05 (H) 03/09/2023    Lab Results  Component Value Date   CHOL 145 10/02/2023   LDLCALC 90 10/02/2023   LDLCALC 80 03/09/2023   LDLCALC 98 05/29/2022   HDL 29 (L) 10/02/2023   TRIG 148 10/02/2023   TRIG 269 (H) 03/09/2023   TRIG 142 05/29/2022   ALT 14 10/02/2023   ALT 18 03/09/2023   AST 8 10/02/2023   AST 11 03/09/2023      Chemistry      Component Value Date/Time   NA 143 01/05/2024 1124   NA  141 04/14/2014 0925   K 3.6 01/05/2024 1124   K 4.0 04/14/2014 0925   CL 103 01/05/2024 1124   CL 103 04/14/2014 0925   CO2 24 01/05/2024 1124   CO2 29 04/14/2014 0925   BUN 16 01/05/2024 1124   BUN 17 04/14/2014 0925   CREATININE 0.88 01/05/2024 1124   CREATININE 0.75 04/14/2014 0925   GLU 147 08/03/2014 0000      Component Value Date/Time   CALCIUM  9.7 01/05/2024 1124   CALCIUM  9.1 04/14/2014 0925   ALKPHOS 87 10/02/2023 1055   ALKPHOS 68 11/02/2013 1007   AST 8 10/02/2023 1055   AST 15 11/02/2013 1007   ALT 14 10/02/2023 1055   ALT 50  11/02/2013 1007   BILITOT 0.3 10/02/2023 1055   BILITOT 0.4 11/02/2013 1007       The 10-year ASCVD risk score (Arnett DK, et al., 2019) is: 28.9%  Lab Results  Component Value Date   MICRALBCREAT 58 (H) 04/20/2024   MICRALBCREAT 11 03/09/2023   MICRALBCREAT <7 11/29/2021   MICRALBCREAT 62 (H) 08/30/2021    A/P: Diabetes currently uncontrolled with a most recent A1c of 8.7% on 04/20/24, which is down from 9.2% on 02/11/24. Patient is able to verbalize appropriate hypoglycemia management plan. Patient did not bring Alexis Lloyd reader to clinic to fully assess control, however, reported BG is consistently in the 200-300s. She has one month left of Mounjaro  samples, however, this is ~$600 on insurance. Will have to pursue patient assistance for Alexis Lloyd .  Current basal dose is ~0.5 units/kg/day. Will likely need mealtime insulin . Preferred product on insurance is Alexis Lloyd . Will also add Alexis Lloyd  to enrollment form for Tresiba  to limit prescription copays.  -Continued basal insulin  Tresiba  (insulin  degludec)  50 units daily. Will need to have Alexis Lloyd start re-enrollment process.  -Started Alexis Lloyd  4 units TID with meals. Will start enrollment process for Alexis Lloyd  via Alexis Lloyd in order to simplify number of applications -Switched GLP-1 Mounjaro  (tirzepatide ) to Alexis Lloyd  (dulaglutide ) 1.5 mg weekly. Will send to Alexis Lloyd to start enrollment process -Extensively discussed pathophysiology of diabetes, recommended lifestyle interventions, dietary effects on blood sugar control.  -Counseled on s/sx of and management of hypoglycemia.  -Next A1c anticipated 07/2024.  -Encouraged patient to have son assist with downloading and setting up Alexis Lloyd by Alexis Lloyd application. Showed patient which application to use.   ASCVD risk - primary prevention in patient with diabetes. Last LDL is 90 mg/dL, not at goal of <29 mg/dL. 10-year ASCVD risk score of 22.9%. Could benefit from high intensity statin therapy.  Will address at follow up.  -Continued rosuvastatin  10 mg daily.  -Continued Lovaza 1 g daily   Patient verbalized understanding of treatment plan. Total time patient counseling 30 minutes.  Follow-up:  Pharmacist on 07/28/24 PCP clinic visit on 07/28/24  Peyton CHARLENA Ferries, PharmD, CPP Clinical Pharmacist [facility] [phone]   "

## 2024-06-29 ENCOUNTER — Telehealth: Payer: Self-pay

## 2024-06-29 DIAGNOSIS — E1165 Type 2 diabetes mellitus with hyperglycemia: Secondary | ICD-10-CM

## 2024-06-30 MED ORDER — INSULIN LISPRO (1 UNIT DIAL) 100 UNIT/ML (KWIKPEN): 4.0000 [IU] | PEN_INJECTOR | Freq: Three times a day (TID) | SUBCUTANEOUS | 11 refills | Status: AC

## 2024-06-30 MED ORDER — BASAGLAR KWIKPEN 100 UNIT/ML ~~LOC~~ SOPN: 50.0000 [IU] | PEN_INJECTOR | Freq: Every day | SUBCUTANEOUS | 11 refills | Status: DC

## 2024-06-30 NOTE — Telephone Encounter (Cosign Needed)
 Patient signed patient assistance paperwork in office and provided income documents for [employer]. To simplify medication therapies, will switch to obtain all insulin  products via [employer].   - Switched Tresiba  to Basaglar  50 units daily - Continued Humalog   - Switched Mounjaro  2.5 mg to Trulicity  1.5 mg weekly  Will coordinate with PAP team for enrollment.   Ruta Capece E. Marsh, PharmD, BCACP, CPP Clinical Pharmacist [facility] [phone]

## 2024-07-01 ENCOUNTER — Telehealth: Payer: Self-pay

## 2024-07-01 NOTE — Telephone Encounter (Signed)
 See separate encounter

## 2024-07-01 NOTE — Telephone Encounter (Signed)
 Received new PAP [employer] pt and provider portion (Basaglar  and Trulicity ) with proof of income.faxed to [employer] today

## 2024-07-01 NOTE — Telephone Encounter (Signed)
 Received new PAP [employer] (Humalog ) pt and provider portion, faxed to [employer] along pt proof of income and Ins card.

## 2024-07-05 ENCOUNTER — Telehealth: Payer: Self-pay

## 2024-07-05 NOTE — Telephone Encounter (Signed)
 Received approval letter from [facility] Basaglar  and Trulicity  thru 06/08/2025 approval letter index.

## 2024-07-05 NOTE — Telephone Encounter (Signed)
 I have not personally seen this, have either of you?

## 2024-07-05 NOTE — Telephone Encounter (Signed)
 I believe this form was completed a few weeks ago. Patient's case worker brought it to the front desk and I completed it and signed it on the same day.

## 2024-07-05 NOTE — Telephone Encounter (Signed)
 Copied from CRM #[id number]. Topic: General - Other >> Jul 04, 2024  5:52 PM Delon HERO wrote: Reason for CRM: : Melissa calling from [facility] is calling to see if Diabetic Enrollment Note Assessment was received CB- [phone]

## 2024-07-06 ENCOUNTER — Ambulatory Visit: Payer: Self-pay | Admitting: *Deleted

## 2024-07-06 NOTE — Telephone Encounter (Signed)
 FYI Only or Action Required?: Action required by provider: medication request.  Patient was last seen in primary care on 05/17/2024 by Sharma Coyer, MD.  Called Nurse Triage reporting Urinary Frequency.  Symptoms began several days ago.  Interventions attempted: Nothing.  Symptoms are: gradually worsening.  Triage Disposition: See Physician Within 24 Hours  Patient/caregiver understands and will follow disposition?: No, refuses disposition   Message from [facility] sent at 07/06/2024  1:12 PM EST  Reason for Triage: frequent urination, itching.. tiredness, sugar 276, was up to 300 this morning - think she has UTI and Yeast infection   Reason for Disposition  Urinating more frequently than usual (i.e., frequency) OR new-onset of the feeling of an urgent need to urinate (i.e., urgency)  Answer Assessment - Initial Assessment Questions Patient is calling to report she has had recent insulin  change and her glucose has been high- she is having symptoms of urinary and yeast infection. Patient states she has this happen every time she change medication. Patient advised of medication request policy and she declines an appointment due to weather and road conditions. Patient request I send message to PCP.   1. SYMPTOM: What's the main symptom you're concerned about? (e.g., frequency, incontinence)     Frequency of urination , urgency 2. ONSET: When did the  symptoms  start?     2-3 days 3. PAIN: Is there any pain? If Yes, ask: How bad is it? (Scale: 1-10; mild, moderate, severe)     No pain 4. CAUSE: What do you think is causing the symptoms?     Recent change in insulin - patient reports her glucose is high 5. OTHER SYMPTOMS: Do you have any other symptoms? (e.g., blood in urine, fever, flank pain, pain with urination)     Itching, fatigued  Protocols used: Urinary Symptoms-A-AH

## 2024-07-06 NOTE — Telephone Encounter (Signed)
 I would recommend a virtual visit to discuss her symptoms and get a urine sample ordered for her to drop off during warmer temperatures to try and determine what is causing her symptoms and best form a treatment plan   Ok to double book an 8A or 1P virtual

## 2024-07-12 ENCOUNTER — Encounter: Payer: Self-pay | Admitting: Family Medicine

## 2024-07-12 ENCOUNTER — Ambulatory Visit: Admitting: Family Medicine

## 2024-07-12 ENCOUNTER — Other Ambulatory Visit (HOSPITAL_COMMUNITY): Admission: RE | Admit: 2024-07-12 | Discharge: 2024-07-12 | Disposition: A | Source: Ambulatory Visit

## 2024-07-12 VITALS — BP 159/67 | HR 88 | Temp 98.4°F | Ht 59.5 in | Wt 225.0 lb

## 2024-07-12 DIAGNOSIS — E1165 Type 2 diabetes mellitus with hyperglycemia: Secondary | ICD-10-CM | POA: Diagnosis not present

## 2024-07-12 DIAGNOSIS — E1169 Type 2 diabetes mellitus with other specified complication: Secondary | ICD-10-CM | POA: Diagnosis not present

## 2024-07-12 DIAGNOSIS — N898 Other specified noninflammatory disorders of vagina: Secondary | ICD-10-CM | POA: Diagnosis not present

## 2024-07-12 DIAGNOSIS — R350 Frequency of micturition: Secondary | ICD-10-CM

## 2024-07-12 DIAGNOSIS — N3941 Urge incontinence: Secondary | ICD-10-CM | POA: Diagnosis not present

## 2024-07-12 DIAGNOSIS — R351 Nocturia: Secondary | ICD-10-CM | POA: Insufficient documentation

## 2024-07-12 DIAGNOSIS — E785 Hyperlipidemia, unspecified: Secondary | ICD-10-CM | POA: Diagnosis not present

## 2024-07-12 DIAGNOSIS — I152 Hypertension secondary to endocrine disorders: Secondary | ICD-10-CM | POA: Diagnosis not present

## 2024-07-12 DIAGNOSIS — Z794 Long term (current) use of insulin: Secondary | ICD-10-CM | POA: Diagnosis not present

## 2024-07-12 DIAGNOSIS — E1159 Type 2 diabetes mellitus with other circulatory complications: Secondary | ICD-10-CM

## 2024-07-12 LAB — POCT URINE DIPSTICK
Bilirubin, UA: NEGATIVE
Glucose, UA: >=1000 mg/dL — AB
Ketones, POC UA: NEGATIVE mg/dL
Leukocytes, UA: NEGATIVE
Nitrite, UA: NEGATIVE
POC PROTEIN,UA: 100 — AB
Spec Grav, UA: 1.015
Urobilinogen, UA: 0.2 U/dL
pH, UA: 6

## 2024-07-12 MED ORDER — BASAGLAR KWIKPEN 100 UNIT/ML ~~LOC~~ SOPN: 48.0000 [IU] | PEN_INJECTOR | Freq: Every day | SUBCUTANEOUS | Status: AC

## 2024-07-12 MED ORDER — FLUCONAZOLE 150 MG PO TABS: ORAL_TABLET | ORAL | 0 refills | Status: AC

## 2024-07-12 NOTE — Assessment & Plan Note (Signed)
 Chronic  Associated with DM  Managed with lisinopril -hydrochlorothiazide  20-12.5mg  daily  - Continue lisinopril -hydrochlorothiazide  as prescribed.

## 2024-07-12 NOTE — Assessment & Plan Note (Signed)
 Urinary symptoms due to urge incontinence and overactive bladder Acute on chronic  Increased urinary frequency and nocturia with urge incontinence. Symptoms partially improved with solifenacin  (VESIcare ). - Continue solifenacin  (VESIcare ) 5 mg daily.

## 2024-07-14 ENCOUNTER — Encounter: Admitting: Dietician

## 2024-07-14 LAB — CERVICOVAGINAL ANCILLARY ONLY
Bacterial Vaginitis (gardnerella): POSITIVE — AB
Candida Glabrata: NEGATIVE
Candida Vaginitis: NEGATIVE
Comment: NEGATIVE
Comment: NEGATIVE
Comment: NEGATIVE

## 2024-07-15 LAB — SPECIMEN STATUS REPORT

## 2024-07-15 LAB — URINE CULTURE

## 2024-07-28 ENCOUNTER — Ambulatory Visit

## 2024-07-28 ENCOUNTER — Ambulatory Visit: Admitting: Family Medicine

## 2024-09-12 ENCOUNTER — Ambulatory Visit: Admitting: Family Medicine

## 2025-02-15 ENCOUNTER — Ambulatory Visit
